# Patient Record
Sex: Male | Born: 1945
Health system: Southern US, Community
[De-identification: ages and names within clinical notes are randomized; demographics above are authoritative.]

## PROBLEM LIST (undated history)

## (undated) DIAGNOSIS — N183 Chronic kidney disease, stage 3 unspecified: Secondary | ICD-10-CM

## (undated) DIAGNOSIS — M1A9XX0 Chronic gout, unspecified, without tophus (tophi): Secondary | ICD-10-CM

## (undated) DIAGNOSIS — C679 Malignant neoplasm of bladder, unspecified: Secondary | ICD-10-CM

## (undated) DIAGNOSIS — M19019 Primary osteoarthritis, unspecified shoulder: Secondary | ICD-10-CM

## (undated) DIAGNOSIS — I340 Nonrheumatic mitral (valve) insufficiency: Secondary | ICD-10-CM

## (undated) DIAGNOSIS — E785 Hyperlipidemia, unspecified: Secondary | ICD-10-CM

## (undated) DIAGNOSIS — F419 Anxiety disorder, unspecified: Secondary | ICD-10-CM

## (undated) DIAGNOSIS — D696 Thrombocytopenia, unspecified: Secondary | ICD-10-CM

## (undated) DIAGNOSIS — R0609 Other forms of dyspnea: Secondary | ICD-10-CM

## (undated) DIAGNOSIS — Z794 Long term (current) use of insulin: Secondary | ICD-10-CM

## (undated) DIAGNOSIS — R600 Localized edema: Secondary | ICD-10-CM

## (undated) DIAGNOSIS — C61 Malignant neoplasm of prostate: Secondary | ICD-10-CM

## (undated) DIAGNOSIS — R0600 Dyspnea, unspecified: Secondary | ICD-10-CM

## (undated) DIAGNOSIS — G4733 Obstructive sleep apnea (adult) (pediatric): Secondary | ICD-10-CM

## (undated) DIAGNOSIS — N184 Chronic kidney disease, stage 4 (severe): Secondary | ICD-10-CM

## (undated) DIAGNOSIS — I351 Nonrheumatic aortic (valve) insufficiency: Secondary | ICD-10-CM

## (undated) DIAGNOSIS — Z9641 Presence of insulin pump (external) (internal): Secondary | ICD-10-CM

## (undated) DIAGNOSIS — I2510 Atherosclerotic heart disease of native coronary artery without angina pectoris: Secondary | ICD-10-CM

## (undated) DIAGNOSIS — M109 Gout, unspecified: Secondary | ICD-10-CM

## (undated) DIAGNOSIS — D649 Anemia, unspecified: Secondary | ICD-10-CM

## (undated) DIAGNOSIS — M1990 Unspecified osteoarthritis, unspecified site: Secondary | ICD-10-CM

## (undated) DIAGNOSIS — M069 Rheumatoid arthritis, unspecified: Secondary | ICD-10-CM

## (undated) DIAGNOSIS — E119 Type 2 diabetes mellitus without complications: Secondary | ICD-10-CM

## (undated) DIAGNOSIS — Z87442 Personal history of urinary calculi: Secondary | ICD-10-CM

## (undated) DIAGNOSIS — IMO0001 Reserved for inherently not codable concepts without codable children: Secondary | ICD-10-CM

## (undated) DIAGNOSIS — M7552 Bursitis of left shoulder: Secondary | ICD-10-CM

## (undated) DIAGNOSIS — I10 Essential (primary) hypertension: Secondary | ICD-10-CM

## (undated) HISTORY — PX: TRANSURETHRAL RESECTION OF BLADDER TUMOR WITH MITOMYCIN-C: SHX6459

## (undated) HISTORY — DX: Hyperlipidemia, unspecified: E78.5

---

## 2001-05-15 ENCOUNTER — Encounter: Payer: Self-pay | Admitting: Family Medicine

## 2001-05-15 ENCOUNTER — Encounter: Admission: RE | Admit: 2001-05-15 | Discharge: 2001-05-15 | Payer: Self-pay | Admitting: Family Medicine

## 2001-06-09 ENCOUNTER — Encounter: Admission: RE | Admit: 2001-06-09 | Discharge: 2001-09-07 | Payer: Self-pay | Admitting: Family Medicine

## 2007-06-18 ENCOUNTER — Emergency Department (HOSPITAL_COMMUNITY): Admission: EM | Admit: 2007-06-18 | Discharge: 2007-06-18 | Payer: Self-pay | Admitting: Emergency Medicine

## 2008-07-31 DIAGNOSIS — Z8551 Personal history of malignant neoplasm of bladder: Secondary | ICD-10-CM

## 2008-07-31 HISTORY — DX: Personal history of malignant neoplasm of bladder: Z85.51

## 2008-08-24 ENCOUNTER — Ambulatory Visit (HOSPITAL_BASED_OUTPATIENT_CLINIC_OR_DEPARTMENT_OTHER): Admission: RE | Admit: 2008-08-24 | Discharge: 2008-08-24 | Payer: Self-pay | Admitting: Urology

## 2008-08-24 ENCOUNTER — Encounter (INDEPENDENT_AMBULATORY_CARE_PROVIDER_SITE_OTHER): Payer: Self-pay | Admitting: Urology

## 2008-08-24 HISTORY — PX: TRANSURETHRAL RESECTION OF BLADDER TUMOR WITH MITOMYCIN-C: SHX6459

## 2009-01-03 ENCOUNTER — Encounter: Admission: RE | Admit: 2009-01-03 | Discharge: 2009-01-03 | Payer: Self-pay | Admitting: Family Medicine

## 2009-09-15 ENCOUNTER — Encounter: Admission: RE | Admit: 2009-09-15 | Discharge: 2009-09-15 | Payer: Self-pay | Admitting: Nephrology

## 2011-02-12 NOTE — Op Note (Signed)
NAME:  Alec [name], Alec [name]                ACCOUNT NO.:  [id number]   MEDICAL RECORD NO.:  [id number]          PATIENT TYPE:  AMB   LOCATION:  [facility]                         FACILITY:  [facility]   PHYSICIAN:  Bernestine Amass, M.D.  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   PROCEDURE PERFORMED:  Transurethral resection of bladder tumor with  cystoscopy and instillation of mitomycin.   SURGEON:  Bernestine Amass, M.D.   ANESTHESIA:  General.   INDICATIONS:  [name] is a 65 year old male who was sent for  consultation by Dr. Roderick Pee.  The patient had experienced some bloody  urethral discharge.  He did not specifically have gross hematuria, but  again blood per urethra.  The patient did have a previous history of  tobacco use and had what appeared to be a substantial exposure to  secondhand smoke.  Cystoscopy in our office revealed a tumor that  appeared to be a papillary noninvasive transitional cell carcinoma.  This involved the posterior wall of his bladder.  The patient underwent  consultation in the office with regard to options.  He was told that  initially he needed a further cystoscopic assessment with  biopsy/resection of this.  The patient appeared to understand the  advantages and disadvantages of that approach.  He is scheduled now for  definitive resection.   TECHNIQUE AND FINDINGS:  The patient was brought to the operating room.  Appropriate time-out was accomplished.  The patient had a successful  induction of general anesthesia and was then placed in lithotomy  position and prepped and draped in the usual manner.  Cystoscopy  revealed an unremarkable anterior urethra.  Minimal lateral lobe tissue  but a fairly high-riding and  prominent median bar.  The bladder was  carefully panendoscoped with 12 degree as well as 70 degree lens  systems.  The patient had a solitary tumor measuring approximately 2 x 3  cm involving the posterior wall of his bladder.  The cystoscope was  removed and a 28 French resectoscope was inserted.  Loop was used to  resect the superficial tumor and also some underlying muscle.  No  evidence of bladder perforation occurred.  The area was cauterized and  hemostasis was excellent.  Reinspection of the area revealed no  bleeding.  Again, there was no evidence of bladder perforation.  A 20  French Foley catheter was inserted.  Urine was clear.  We went ahead  with mitomycin instillation putting in 30 mg in 3 mL of fluid.  The  catheter was capped and will be left indwelling for approximately 45  minutes at which point his bladder will be drained and gently irrigated.  The patient will be discharged home with a Foley catheter for 48 hours  with  instructions to remove that himself.  Followup will be arranged in our  office in 7-10  days and sooner if necessary.  The patient appeared to  tolerate the procedure well and with no obvious complications.  He was  brought to the recovery room in stable condition.      Bernestine Amass, M.D.  Electronically Signed     DSG/MEDQ  D:  08/24/2008  T:  08/24/2008  Job:  [id number]   cc:   Osvaldo Human, M.D.  Fax: [phone]

## 2011-03-08 ENCOUNTER — Other Ambulatory Visit: Payer: Self-pay

## 2011-07-02 LAB — POCT I-STAT 4, (NA,K, GLUC, HGB,HCT)
Glucose, Bld: 122 — ABNORMAL HIGH
HCT: 45
Hemoglobin: 15.3
Potassium: 4
Sodium: 140

## 2011-07-02 LAB — GLUCOSE, CAPILLARY: Glucose-Capillary: 179 — ABNORMAL HIGH

## 2011-07-11 LAB — URINALYSIS, ROUTINE W REFLEX MICROSCOPIC
Bilirubin Urine: NEGATIVE
Glucose, UA: NEGATIVE
Hgb urine dipstick: NEGATIVE
Ketones, ur: NEGATIVE
Nitrite: NEGATIVE
Protein, ur: NEGATIVE
Specific Gravity, Urine: 1.016
Urobilinogen, UA: 1
pH: 6.5

## 2011-07-11 LAB — BASIC METABOLIC PANEL
BUN: 19
CO2: 25
Calcium: 9.7
Chloride: 105
Creatinine, Ser: 1.75 — ABNORMAL HIGH
GFR calc Af Amer: 49 — ABNORMAL LOW
GFR calc non Af Amer: 40 — ABNORMAL LOW
Glucose, Bld: 128 — ABNORMAL HIGH
Potassium: 3.8
Sodium: 136

## 2011-07-11 LAB — HEPATIC FUNCTION PANEL
ALT: 29
AST: 27
Albumin: 3.7
Alkaline Phosphatase: 48
Bilirubin, Direct: <0.1
Total Bilirubin: 0.8
Total Protein: 7

## 2011-07-11 LAB — CBC
HCT: 40.8
Hemoglobin: 14.3
MCHC: 35.2
MCV: 88.9
Platelets: 174
RBC: 4.59
RDW: 13.3
WBC: 3.9 — ABNORMAL LOW

## 2011-07-11 LAB — LIPASE, BLOOD: Lipase: 32

## 2011-07-11 LAB — DIFFERENTIAL
Basophils Absolute: 0
Basophils Relative: 0
Eosinophils Absolute: 0.1
Eosinophils Relative: 1
Lymphocytes Relative: 35
Lymphs Abs: 1.4
Monocytes Absolute: 0.4
Monocytes Relative: 10
Neutro Abs: 2.1
Neutrophils Relative %: 53

## 2011-07-15 ENCOUNTER — Other Ambulatory Visit: Payer: Self-pay | Admitting: Gastroenterology

## 2011-12-25 ENCOUNTER — Encounter: Payer: Self-pay | Admitting: Physician Assistant

## 2011-12-25 DIAGNOSIS — Z0289 Encounter for other administrative examinations: Secondary | ICD-10-CM

## 2012-02-14 ENCOUNTER — Other Ambulatory Visit: Payer: Self-pay | Admitting: Surgery

## 2012-06-16 NOTE — Progress Notes (Signed)
This encounter was created in error - please disregard.

## 2012-06-25 ENCOUNTER — Encounter: Payer: Self-pay | Admitting: Family Medicine

## 2012-08-29 ENCOUNTER — Emergency Department (HOSPITAL_COMMUNITY): Payer: Managed Care, Other (non HMO)

## 2012-08-29 ENCOUNTER — Encounter (HOSPITAL_COMMUNITY): Payer: Self-pay | Admitting: Anesthesiology

## 2012-08-29 ENCOUNTER — Encounter (HOSPITAL_COMMUNITY)
Admission: EM | Disposition: A | Discharge status: Home / Self Care | Payer: Self-pay | Source: Home / Self Care | Attending: Emergency Medicine

## 2012-08-29 ENCOUNTER — Observation Stay (HOSPITAL_COMMUNITY): Payer: Managed Care, Other (non HMO) | Admitting: Anesthesiology

## 2012-08-29 ENCOUNTER — Observation Stay (HOSPITAL_COMMUNITY): Payer: Managed Care, Other (non HMO)

## 2012-08-29 ENCOUNTER — Observation Stay (HOSPITAL_COMMUNITY)
Admission: EM | Admit: 2012-08-29 | Discharge: 2012-08-30 | Disposition: A | Discharge status: Home / Self Care | Payer: Managed Care, Other (non HMO) | Attending: General Surgery | Admitting: General Surgery

## 2012-08-29 ENCOUNTER — Encounter (HOSPITAL_COMMUNITY): Payer: Self-pay | Admitting: Family Medicine

## 2012-08-29 DIAGNOSIS — K8000 Calculus of gallbladder with acute cholecystitis without obstruction: Principal | ICD-10-CM | POA: Insufficient documentation

## 2012-08-29 DIAGNOSIS — K8020 Calculus of gallbladder without cholecystitis without obstruction: Secondary | ICD-10-CM

## 2012-08-29 DIAGNOSIS — I10 Essential (primary) hypertension: Secondary | ICD-10-CM | POA: Insufficient documentation

## 2012-08-29 DIAGNOSIS — E119 Type 2 diabetes mellitus without complications: Secondary | ICD-10-CM | POA: Insufficient documentation

## 2012-08-29 DIAGNOSIS — K810 Acute cholecystitis: Secondary | ICD-10-CM | POA: Diagnosis present

## 2012-08-29 DIAGNOSIS — Z79899 Other long term (current) drug therapy: Secondary | ICD-10-CM | POA: Insufficient documentation

## 2012-08-29 DIAGNOSIS — K8010 Calculus of gallbladder with chronic cholecystitis without obstruction: Secondary | ICD-10-CM

## 2012-08-29 DIAGNOSIS — Z7982 Long term (current) use of aspirin: Secondary | ICD-10-CM | POA: Insufficient documentation

## 2012-08-29 DIAGNOSIS — Z23 Encounter for immunization: Secondary | ICD-10-CM | POA: Insufficient documentation

## 2012-08-29 DIAGNOSIS — R109 Unspecified abdominal pain: Secondary | ICD-10-CM

## 2012-08-29 DIAGNOSIS — IMO0001 Reserved for inherently not codable concepts without codable children: Secondary | ICD-10-CM | POA: Diagnosis present

## 2012-08-29 DIAGNOSIS — Z794 Long term (current) use of insulin: Secondary | ICD-10-CM | POA: Diagnosis present

## 2012-08-29 DIAGNOSIS — E785 Hyperlipidemia, unspecified: Secondary | ICD-10-CM | POA: Diagnosis present

## 2012-08-29 DIAGNOSIS — E1122 Type 2 diabetes mellitus with diabetic chronic kidney disease: Secondary | ICD-10-CM | POA: Diagnosis present

## 2012-08-29 HISTORY — PX: CHOLECYSTECTOMY: SHX55

## 2012-08-29 HISTORY — DX: Essential (primary) hypertension: I10

## 2012-08-29 LAB — URINALYSIS, ROUTINE W REFLEX MICROSCOPIC
Bilirubin Urine: NEGATIVE
Glucose, UA: >1000 mg/dL — AB
Hgb urine dipstick: NEGATIVE
Ketones, ur: NEGATIVE mg/dL
Leukocytes, UA: NEGATIVE
Nitrite: NEGATIVE
Protein, ur: 100 mg/dL — AB
Specific Gravity, Urine: 1.022 (ref 1.005–1.030)
Urobilinogen, UA: 0.2 mg/dL (ref 0.0–1.0)
pH: 5.5 (ref 5.0–8.0)

## 2012-08-29 LAB — CBC WITH DIFFERENTIAL/PLATELET
Basophils Absolute: 0 10*3/uL (ref 0.0–0.1)
Basophils Relative: 1 % (ref 0–1)
Eosinophils Absolute: 0 10*3/uL (ref 0.0–0.7)
Eosinophils Relative: 1 % (ref 0–5)
HCT: 35.2 % — ABNORMAL LOW (ref 39.0–52.0)
Hemoglobin: 12.9 g/dL — ABNORMAL LOW (ref 13.0–17.0)
Lymphocytes Relative: 34 % (ref 12–46)
Lymphs Abs: 1 10*3/uL (ref 0.7–4.0)
MCH: 31.3 pg (ref 26.0–34.0)
MCHC: 36.6 g/dL — ABNORMAL HIGH (ref 30.0–36.0)
MCV: 85.4 fL (ref 78.0–100.0)
Monocytes Absolute: 0.3 10*3/uL (ref 0.1–1.0)
Monocytes Relative: 11 % (ref 3–12)
Neutro Abs: 1.5 10*3/uL — ABNORMAL LOW (ref 1.7–7.7)
Neutrophils Relative %: 53 % (ref 43–77)
Platelets: 134 10*3/uL — ABNORMAL LOW (ref 150–400)
RBC: 4.12 MIL/uL — ABNORMAL LOW (ref 4.22–5.81)
RDW: 12.6 % (ref 11.5–15.5)
WBC: 2.8 10*3/uL — ABNORMAL LOW (ref 4.0–10.5)

## 2012-08-29 LAB — SURGICAL PCR SCREEN
MRSA, PCR: NEGATIVE
Staphylococcus aureus: POSITIVE — AB

## 2012-08-29 LAB — COMPREHENSIVE METABOLIC PANEL
ALT: 28 U/L (ref 0–53)
AST: 22 U/L (ref 0–37)
Albumin: 4 g/dL (ref 3.5–5.2)
Alkaline Phosphatase: 82 U/L (ref 39–117)
BUN: 26 mg/dL — ABNORMAL HIGH (ref 6–23)
CO2: 21 mEq/L (ref 19–32)
Calcium: 9.9 mg/dL (ref 8.4–10.5)
Chloride: 102 mEq/L (ref 96–112)
Creatinine, Ser: 1.33 mg/dL (ref 0.50–1.35)
GFR calc Af Amer: 64 mL/min — ABNORMAL LOW (ref >90)
GFR calc non Af Amer: 55 mL/min — ABNORMAL LOW (ref >90)
Glucose, Bld: 317 mg/dL — ABNORMAL HIGH (ref 70–99)
Potassium: 3.7 mEq/L (ref 3.5–5.1)
Sodium: 135 mEq/L (ref 135–145)
Total Bilirubin: 0.4 mg/dL (ref 0.3–1.2)
Total Protein: 7.2 g/dL (ref 6.0–8.3)

## 2012-08-29 LAB — HEMOGLOBIN A1C
Hgb A1c MFr Bld: 9.3 % — ABNORMAL HIGH (ref <5.7)
Mean Plasma Glucose: 220 mg/dL — ABNORMAL HIGH (ref <117)

## 2012-08-29 LAB — GLUCOSE, CAPILLARY
Glucose-Capillary: 200 mg/dL — ABNORMAL HIGH (ref 70–99)
Glucose-Capillary: 117 mg/dL — ABNORMAL HIGH (ref 70–99)
Glucose-Capillary: 116 mg/dL — ABNORMAL HIGH (ref 70–99)
Glucose-Capillary: 289 mg/dL — ABNORMAL HIGH (ref 70–99)

## 2012-08-29 LAB — URINE MICROSCOPIC-ADD ON

## 2012-08-29 LAB — LIPASE, BLOOD: Lipase: 70 U/L — ABNORMAL HIGH (ref 11–59)

## 2012-08-29 SURGERY — LAPAROSCOPIC CHOLECYSTECTOMY WITH INTRAOPERATIVE CHOLANGIOGRAM
Anesthesia: General | Site: Abdomen | Wound class: Contaminated

## 2012-08-29 MED ORDER — ACETAMINOPHEN 10 MG/ML IV SOLN: 1000.0000 mg | Freq: Once | INTRAVENOUS | Status: DC | PRN

## 2012-08-29 MED ORDER — LACTATED RINGERS IV SOLN
INTRAVENOUS | Status: DC | PRN
  Administered 2012-08-29 (×3): via INTRAVENOUS

## 2012-08-29 MED ORDER — SUCCINYLCHOLINE CHLORIDE 20 MG/ML IJ SOLN
INTRAMUSCULAR | Status: DC | PRN
  Administered 2012-08-29: 100 mg via INTRAVENOUS

## 2012-08-29 MED ORDER — LACTATED RINGERS IR SOLN
Status: DC | PRN
  Administered 2012-08-29: 1000 mL

## 2012-08-29 MED ORDER — GLYCOPYRROLATE 0.2 MG/ML IJ SOLN
INTRAMUSCULAR | Status: DC | PRN
  Administered 2012-08-29: 0.6 mg via INTRAVENOUS

## 2012-08-29 MED ORDER — SODIUM CHLORIDE 0.9 % IV BOLUS (SEPSIS)
1000.0000 mL | Freq: Once | INTRAVENOUS | Status: AC
  Administered 2012-08-29: 1000 mL via INTRAVENOUS

## 2012-08-29 MED ORDER — SODIUM CHLORIDE 0.9 % IV SOLN
INTRAVENOUS | Status: DC | PRN
  Administered 2012-08-29: 15:00:00

## 2012-08-29 MED ORDER — OXYCODONE HCL 5 MG PO TABS: 5.0000 mg | ORAL_TABLET | Freq: Once | ORAL | Status: DC | PRN

## 2012-08-29 MED ORDER — BUPIVACAINE-EPINEPHRINE 0.25% -1:200000 IJ SOLN
INTRAMUSCULAR | Status: DC | PRN
  Administered 2012-08-29: 10 mL

## 2012-08-29 MED ORDER — IOHEXOL 300 MG/ML  SOLN
INTRAMUSCULAR | Status: AC
  Filled 2012-08-29: qty 1

## 2012-08-29 MED ORDER — ONDANSETRON HCL 4 MG/2ML IJ SOLN: 4.0000 mg | Freq: Four times a day (QID) | INTRAMUSCULAR | Status: DC | PRN

## 2012-08-29 MED ORDER — POTASSIUM CHLORIDE IN NACL 20-0.9 MEQ/L-% IV SOLN
INTRAVENOUS | Status: DC
  Administered 2012-08-29: 11:00:00 via INTRAVENOUS
  Filled 2012-08-29 (×2): qty 1000

## 2012-08-29 MED ORDER — HYDROMORPHONE HCL PF 1 MG/ML IJ SOLN
INTRAMUSCULAR | Status: AC
  Filled 2012-08-29: qty 1

## 2012-08-29 MED ORDER — LIDOCAINE HCL (CARDIAC) 20 MG/ML IV SOLN
INTRAVENOUS | Status: DC | PRN
  Administered 2012-08-29: 50 mg via INTRAVENOUS

## 2012-08-29 MED ORDER — ONDANSETRON HCL 4 MG/2ML IJ SOLN
INTRAMUSCULAR | Status: DC | PRN
  Administered 2012-08-29: 4 mg via INTRAVENOUS

## 2012-08-29 MED ORDER — PANTOPRAZOLE SODIUM 40 MG IV SOLR
40.0000 mg | Freq: Every day | INTRAVENOUS | Status: DC
  Filled 2012-08-29: qty 40

## 2012-08-29 MED ORDER — INSULIN REGULAR HUMAN 100 UNIT/ML IJ SOLN: 6.0000 [IU] | Freq: Once | INTRAMUSCULAR | Status: DC

## 2012-08-29 MED ORDER — MEPERIDINE HCL 25 MG/ML IJ SOLN: 6.2500 mg | INTRAMUSCULAR | Status: DC | PRN

## 2012-08-29 MED ORDER — CEFAZOLIN SODIUM-DEXTROSE 2-3 GM-% IV SOLR
2.0000 g | Freq: Three times a day (TID) | INTRAVENOUS | Status: DC
  Administered 2012-08-29: 2 g via INTRAVENOUS
  Filled 2012-08-29 (×2): qty 50

## 2012-08-29 MED ORDER — ROCURONIUM BROMIDE 100 MG/10ML IV SOLN
INTRAVENOUS | Status: DC | PRN
  Administered 2012-08-29: 30 mg via INTRAVENOUS

## 2012-08-29 MED ORDER — OXYCODONE-ACETAMINOPHEN 5-325 MG PO TABS: 1.0000 | ORAL_TABLET | ORAL | Status: DC | PRN

## 2012-08-29 MED ORDER — INSULIN GLARGINE 100 UNIT/ML ~~LOC~~ SOLN
10.0000 [IU] | SUBCUTANEOUS | Status: AC
  Administered 2012-08-29: 10 [IU] via SUBCUTANEOUS
  Filled 2012-08-29: qty 1

## 2012-08-29 MED ORDER — MIDAZOLAM HCL 5 MG/5ML IJ SOLN
INTRAMUSCULAR | Status: DC | PRN
  Administered 2012-08-29: 2 mg via INTRAVENOUS

## 2012-08-29 MED ORDER — NEOSTIGMINE METHYLSULFATE 1 MG/ML IJ SOLN
INTRAMUSCULAR | Status: DC | PRN
  Administered 2012-08-29: 4 mg via INTRAVENOUS

## 2012-08-29 MED ORDER — HYDROMORPHONE HCL PF 1 MG/ML IJ SOLN
1.0000 mg | INTRAMUSCULAR | Status: DC | PRN
  Administered 2012-08-29 – 2012-08-30 (×3): 1 mg via INTRAVENOUS
  Filled 2012-08-29 (×2): qty 1

## 2012-08-29 MED ORDER — FENTANYL CITRATE 0.05 MG/ML IJ SOLN
INTRAMUSCULAR | Status: DC | PRN
  Administered 2012-08-29 (×2): 50 ug via INTRAVENOUS
  Administered 2012-08-29: 100 ug via INTRAVENOUS

## 2012-08-29 MED ORDER — HYDROMORPHONE HCL PF 1 MG/ML IJ SOLN
0.2500 mg | INTRAMUSCULAR | Status: DC | PRN
  Administered 2012-08-29 (×2): 0.5 mg via INTRAVENOUS

## 2012-08-29 MED ORDER — EPHEDRINE SULFATE 50 MG/ML IJ SOLN
INTRAMUSCULAR | Status: DC | PRN
  Administered 2012-08-29: 10 mg via INTRAVENOUS
  Administered 2012-08-29: 5 mg via INTRAVENOUS

## 2012-08-29 MED ORDER — SODIUM CHLORIDE 0.9 % IV SOLN
Freq: Once | INTRAVENOUS | Status: AC
  Administered 2012-08-29: 150 mL/h via INTRAVENOUS

## 2012-08-29 MED ORDER — DEXAMETHASONE SODIUM PHOSPHATE 10 MG/ML IJ SOLN
INTRAMUSCULAR | Status: DC | PRN
  Administered 2012-08-29: 10 mg via INTRAVENOUS

## 2012-08-29 MED ORDER — HYDRALAZINE HCL 20 MG/ML IJ SOLN
10.0000 mg | INTRAMUSCULAR | Status: DC | PRN
  Filled 2012-08-29: qty 0.5

## 2012-08-29 MED ORDER — BUPIVACAINE-EPINEPHRINE PF 0.25-1:200000 % IJ SOLN
INTRAMUSCULAR | Status: AC
  Filled 2012-08-29: qty 30

## 2012-08-29 MED ORDER — INSULIN ASPART 100 UNIT/ML ~~LOC~~ SOLN
6.0000 [IU] | Freq: Once | SUBCUTANEOUS | Status: AC
  Administered 2012-08-29: 6 [IU] via SUBCUTANEOUS
  Filled 2012-08-29: qty 1

## 2012-08-29 MED ORDER — MORPHINE SULFATE 2 MG/ML IJ SOLN: 2.0000 mg | INTRAMUSCULAR | Status: DC | PRN

## 2012-08-29 MED ORDER — PNEUMOCOCCAL VAC POLYVALENT 25 MCG/0.5ML IJ INJ
0.5000 mL | INJECTION | INTRAMUSCULAR | Status: DC
  Administered 2012-08-30: 0.5 mL via INTRAMUSCULAR

## 2012-08-29 MED ORDER — EPINEPHRINE HCL 1 MG/ML IJ SOLN
INTRAMUSCULAR | Status: AC
  Filled 2012-08-29: qty 1

## 2012-08-29 MED ORDER — MORPHINE SULFATE 4 MG/ML IJ SOLN
4.0000 mg | Freq: Once | INTRAMUSCULAR | Status: AC
  Administered 2012-08-29: 4 mg via INTRAVENOUS
  Filled 2012-08-29: qty 1

## 2012-08-29 MED ORDER — CEFAZOLIN SODIUM-DEXTROSE 2-3 GM-% IV SOLR
INTRAVENOUS | Status: AC
  Filled 2012-08-29: qty 50

## 2012-08-29 MED ORDER — PROMETHAZINE HCL 25 MG/ML IJ SOLN: 6.2500 mg | INTRAMUSCULAR | Status: DC | PRN

## 2012-08-29 MED ORDER — OXYCODONE HCL 5 MG/5ML PO SOLN: 5.0000 mg | Freq: Once | ORAL | Status: DC | PRN

## 2012-08-29 MED ORDER — PROPOFOL 10 MG/ML IV BOLUS
INTRAVENOUS | Status: DC | PRN
  Administered 2012-08-29: 160 mg via INTRAVENOUS

## 2012-08-29 MED ORDER — ONDANSETRON HCL 4 MG/2ML IJ SOLN
4.0000 mg | Freq: Once | INTRAMUSCULAR | Status: AC
  Administered 2012-08-29: 4 mg via INTRAVENOUS
  Filled 2012-08-29: qty 2

## 2012-08-29 MED ORDER — INSULIN ASPART 100 UNIT/ML ~~LOC~~ SOLN
0.0000 [IU] | SUBCUTANEOUS | Status: DC
  Administered 2012-08-29: 8 [IU] via SUBCUTANEOUS
  Administered 2012-08-30 (×2): 3 [IU] via SUBCUTANEOUS
  Administered 2012-08-30: 8 [IU] via SUBCUTANEOUS

## 2012-08-29 MED ORDER — IBUPROFEN 600 MG PO TABS
600.0000 mg | ORAL_TABLET | Freq: Four times a day (QID) | ORAL | Status: DC | PRN
  Filled 2012-08-29: qty 1

## 2012-08-29 MED ORDER — INSULIN GLARGINE 100 UNIT/ML ~~LOC~~ SOLN
5.0000 [IU] | Freq: Every day | SUBCUTANEOUS | Status: DC
  Administered 2012-08-29: 5 [IU] via SUBCUTANEOUS

## 2012-08-29 MED ORDER — CEFAZOLIN SODIUM 1-5 GM-% IV SOLN
1.0000 g | Freq: Four times a day (QID) | INTRAVENOUS | Status: AC
  Administered 2012-08-29 – 2012-08-30 (×3): 1 g via INTRAVENOUS
  Filled 2012-08-29 (×3): qty 50

## 2012-08-29 MED ORDER — ONDANSETRON HCL 4 MG PO TABS: 4.0000 mg | ORAL_TABLET | Freq: Four times a day (QID) | ORAL | Status: DC | PRN

## 2012-08-29 SURGICAL SUPPLY — 43 items
APL SKNCLS STERI-STRIP NONHPOA (GAUZE/BANDAGES/DRESSINGS) ×1
APPLIER CLIP ROT 10 11.4 M/L (STAPLE) ×2
APR CLP MED LRG 11.4X10 (STAPLE) ×1
BAG SPEC RTRVL LRG 6X4 10 (ENDOMECHANICALS) ×1
BENZOIN TINCTURE PRP APPL 2/3 (GAUZE/BANDAGES/DRESSINGS) ×2 IMPLANT
CANISTER SUCTION 2500CC (MISCELLANEOUS) ×2 IMPLANT
CHLORAPREP W/TINT 26ML (MISCELLANEOUS) ×2 IMPLANT
CLIP APPLIE ROT 10 11.4 M/L (STAPLE) ×1 IMPLANT
CLOTH BEACON ORANGE TIMEOUT ST (SAFETY) ×2 IMPLANT
COVER MAYO STAND STRL (DRAPES) ×2 IMPLANT
DECANTER SPIKE VIAL GLASS SM (MISCELLANEOUS) ×2 IMPLANT
DRAPE C-ARM 42X72 X-RAY (DRAPES) ×2 IMPLANT
DRAPE LAPAROSCOPIC ABDOMINAL (DRAPES) ×2 IMPLANT
DRAPE UTILITY XL STRL (DRAPES) ×2 IMPLANT
DRSG TEGADERM 2-3/8X2-3/4 SM (GAUZE/BANDAGES/DRESSINGS) ×7 IMPLANT
DRSG TEGADERM 4X4.75 (GAUZE/BANDAGES/DRESSINGS) ×2 IMPLANT
ELECT REM PT RETURN 9FT ADLT (ELECTROSURGICAL) ×2
ELECTRODE REM PT RTRN 9FT ADLT (ELECTROSURGICAL) ×1 IMPLANT
FILTER SMOKE EVAC LAPAROSHD (FILTER) ×1 IMPLANT
GLOVE BIO SURGEON STRL SZ7 (GLOVE) ×2 IMPLANT
GLOVE BIOGEL PI IND STRL 7.0 (GLOVE) ×1 IMPLANT
GLOVE BIOGEL PI IND STRL 7.5 (GLOVE) ×1 IMPLANT
GLOVE BIOGEL PI INDICATOR 7.0 (GLOVE) ×1
GLOVE BIOGEL PI INDICATOR 7.5 (GLOVE) ×2
GOWN STRL NON-REIN LRG LVL3 (GOWN DISPOSABLE) ×4 IMPLANT
GOWN STRL REIN XL XLG (GOWN DISPOSABLE) ×2 IMPLANT
HEMOSTAT SNOW SURGICEL 2X4 (HEMOSTASIS) ×1 IMPLANT
KIT BASIN OR (CUSTOM PROCEDURE TRAY) ×2 IMPLANT
NS IRRIG 1000ML POUR BTL (IV SOLUTION) ×2 IMPLANT
POUCH SPECIMEN RETRIEVAL 10MM (ENDOMECHANICALS) ×2 IMPLANT
RINGERS IRRIG 1000ML POUR BTL (IV SOLUTION) ×2 IMPLANT
SET CHOLANGIOGRAPH MIX (MISCELLANEOUS) ×2 IMPLANT
SET IRRIG TUBING LAPAROSCOPIC (IRRIGATION / IRRIGATOR) ×2 IMPLANT
SLEEVE SURGEON STRL (DRAPES) ×1 IMPLANT
SOLUTION ANTI FOG 6CC (MISCELLANEOUS) ×2 IMPLANT
STRIP CLOSURE SKIN 1/2X4 (GAUZE/BANDAGES/DRESSINGS) ×2 IMPLANT
SUT MNCRL AB 4-0 PS2 18 (SUTURE) ×2 IMPLANT
TOWEL OR 17X26 10 PK STRL BLUE (TOWEL DISPOSABLE) ×2 IMPLANT
TRAY LAP CHOLE (CUSTOM PROCEDURE TRAY) ×2 IMPLANT
TROCAR BLADELESS OPT 5 75 (ENDOMECHANICALS) ×4 IMPLANT
TROCAR XCEL BLUNT TIP 100MML (ENDOMECHANICALS) ×2 IMPLANT
TROCAR XCEL NON-BLD 11X100MML (ENDOMECHANICALS) ×2 IMPLANT
TUBING INSUFFLATION 10FT LAP (TUBING) ×2 IMPLANT

## 2012-08-29 NOTE — ED Provider Notes (Signed)
History     CSN: [id number]  Arrival date & time 08/29/12  [id number]   First MD Initiated Contact with Patient 08/29/12 [phone]      Chief Complaint  Patient presents with  . Abdominal Pain    (Consider location/radiation/quality/duration/timing/severity/associated sxs/prior treatment) HPI 66 year old male presents to emergency room with complaint of acute onset of periumbilical and right upper quadrant pain waking from sleep around 1 AM. Patient reports he had similar episode about 5 years ago, told he had gallstones. He followed up with a surgeon and was offered a cholecystectomy, but never followed up. He's not had pain in the interim. He denies any fever and chills. He has had nausea and vomiting. He denies any radiation of pain into his back or shoulder. He denies any diarrhea, no urinary symptoms. Patient has history of hypertension and diabetes. He reports his blood sugar runs from 100s to 150s.  Past Medical History  Diagnosis Date  . Hypertension   . Diabetes mellitus without complication     History reviewed. No pertinent past surgical history.  No family history on file.  History  Substance Use Topics  . Smoking status: Former Research scientist (life sciences)  . Smokeless tobacco: Not on file  . Alcohol Use: No      Review of Systems  See History of Present Illness; otherwise all other systems are reviewed and negative  Allergies  Review of patient's allergies indicates no known allergies.  Home Medications   Current Outpatient Rx  Name  Route  Sig  Dispense  Refill  . AMLODIPINE BESYLATE 10 MG PO TABS   Oral   Take 10 mg by mouth daily.         . ASPIRIN 81 MG PO TABS   Oral   Take 81 mg by mouth daily.         . ATORVASTATIN CALCIUM 20 MG PO TABS   Oral   Take 20 mg by mouth daily.         Marland Kitchen VITAMIN D3 3000 UNITS PO TABS   Oral   Take 1 tablet by mouth daily.         Marland Kitchen CINNAMON 500 MG PO TABS   Oral   Take 1,000 mg by mouth daily.         . COLESEVELAM HCL 625  MG PO TABS   Oral   Take 1,875 mg by mouth 2 (two) times daily with a meal. Pt takes 3 tabs twice daily for 1875 mg dose each time         . OMEGA-3 FATTY ACIDS 1000 MG PO CAPS   Oral   Take 2 g by mouth daily.         Marland Kitchen GEMFIBROZIL 600 MG PO TABS   Oral   Take 600 mg by mouth 2 (two) times daily before a meal.         . GLIMEPIRIDE 4 MG PO TABS   Oral   Take 4 mg by mouth daily before breakfast.         . INSULIN ASPART 100 UNIT/ML [place] SOLN   Subcutaneous   Inject 10 Units into the skin 3 (three) times daily before meals. Pt uses sliding scale         . INSULIN DETEMIR 100 UNIT/ML [place] SOLN   Subcutaneous   Inject 15 Units into the skin at bedtime.         Marland Kitchen LINAGLIPTIN 5 MG PO TABS   Oral   Take 5  mg by mouth daily.         Marland Kitchen LISINOPRIL-HYDROCHLOROTHIAZIDE 20-12.5 MG PO TABS   Oral   Take 1 tablet by mouth daily.         Marland Kitchen ONE-DAILY MULTI VITAMINS PO TABS   Oral   Take 1 tablet by mouth daily.         . SERTRALINE HCL 100 MG PO TABS   Oral   Take 150 mg by mouth daily. Pt takes 1 and 1/2 tablet for 150 mg dose           BP 154/67  Pulse 59  Temp 97.5 F (36.4 C)  Resp 18  SpO2 97%  Physical Exam  Nursing note and vitals reviewed. Constitutional: He is oriented to person, place, and time. He appears well-developed and well-nourished.  HENT:  Head: Normocephalic and atraumatic.  Nose: Nose normal.  Mouth/Throat: Oropharynx is clear and moist.  Eyes: Conjunctivae normal and EOM are normal. Pupils are equal, round, and reactive to light.  Neck: Normal range of motion. Neck supple. No JVD present. No tracheal deviation present. No thyromegaly present.  Cardiovascular: Normal rate, regular rhythm, normal heart sounds and intact distal pulses.  Exam reveals no gallop and no friction rub.   No murmur heard. Pulmonary/Chest: Effort normal and breath sounds normal. No stridor. No respiratory distress. He has no wheezes. He has no rales. He exhibits  no tenderness.  Abdominal: Soft. Bowel sounds are normal. He exhibits no distension and no mass. There is tenderness (tenderness palpation in right upper quadrant, positive Murphy's sign, and periumbilical area no rebound or guarding). There is no rebound and no guarding.  Musculoskeletal: Normal range of motion. He exhibits no edema and no tenderness.  Lymphadenopathy:    He has no cervical adenopathy.  Neurological: He is alert and oriented to person, place, and time. He exhibits normal muscle tone. Coordination normal.  Skin: Skin is warm and dry. No rash noted. No erythema. No pallor.  Psychiatric: He has a normal mood and affect. His behavior is normal. Judgment and thought content normal.    ED Course  Procedures (including critical care time)  Labs Reviewed  CBC WITH DIFFERENTIAL - Abnormal; Notable for the following:    WBC 2.8 (*)     RBC 4.12 (*)     Hemoglobin 12.9 (*)     HCT 35.2 (*)     MCHC 36.6 (*)     Platelets 134 (*)     Neutro Abs 1.5 (*)     All other components within normal limits  COMPREHENSIVE METABOLIC PANEL - Abnormal; Notable for the following:    Glucose, Bld 317 (*)     BUN 26 (*)     GFR calc non Af Amer 55 (*)     GFR calc Af Amer 64 (*)     All other components within normal limits  LIPASE, BLOOD - Abnormal; Notable for the following:    Lipase 70 (*)     All other components within normal limits   No results found.   No diagnosis found.    MDM  66 year old male with abdominal pain. He is noted to have pancytopenia, unknown etiology. Glucose is elevated at 317. We'll get ultrasound of his abdomen to evaluate for gallstones.   Care transferred to Dr Tomi Bamberger awaiting u/s results.        Kalman Drape, MD 08/29/12 2041

## 2012-08-29 NOTE — Op Note (Signed)
Laparoscopic Cholecystectomy with IOC Procedure Note  Indications: This patient presents with symptomatic gallbladder disease and will undergo laparoscopic cholecystectomy.  Pre-operative Diagnosis: Calculus of gallbladder with acute cholecystitis, without mention of obstruction  Post-operative Diagnosis: Same  Surgeon: Shakala Marlatt K.   Assistants: Greer Pickerel, MD, FACS  Anesthesia: General endotracheal anesthesia  ASA Class: 2E  Procedure Details  The patient was seen again in the Holding Room. The risks, benefits, complications, treatment options, and expected outcomes were discussed with the patient. The possibilities of reaction to medication, pulmonary aspiration, perforation of viscus, bleeding, recurrent infection, finding a normal gallbladder, the need for additional procedures, failure to diagnose a condition, the possible need to convert to an open procedure, and creating a complication requiring transfusion or operation were discussed with the patient. The likelihood of improving the patient's symptoms with return to their baseline status is good.  The patient and/or family concurred with the proposed plan, giving informed consent. The site of surgery properly noted. The patient was taken to Operating Room, identified as Alec [name] and the procedure verified as Laparoscopic Cholecystectomy with Intraoperative Cholangiogram. A Time Out was held and the above information confirmed.  Prior to the induction of general anesthesia, antibiotic prophylaxis was administered. General endotracheal anesthesia was then administered and tolerated well. After the induction, the abdomen was prepped with Chloraprep and draped in the sterile fashion. The patient was positioned in the supine position.  Local anesthetic agent was injected into the skin near the umbilicus and an incision made. We dissected down to the abdominal fascia with blunt dissection.  The fascia was incised vertically and we  entered the peritoneal cavity bluntly.  A pursestring suture of 0-Vicryl was placed around the fascial opening.  The Hasson cannula was inserted and secured with the stay suture.  Pneumoperitoneum was then created with CO2 and tolerated well without any adverse changes in the patient's vital signs. An 11-mm port was placed in the subxiphoid position.  Two 5-mm ports were placed in the right upper quadrant. All skin incisions were infiltrated with a local anesthetic agent before making the incision and placing the trocars.   We positioned the patient in reverse Trendelenburg, tilted slightly to the patient's left.  The visualization was difficult, so we placed another 5 mm trocar in the midline and used this to retract the omentum.  The gallbladder was identified, the fundus grasped and retracted cephalad. Adhesions were lysed bluntly and with the electrocautery where indicated, taking care not to injure any adjacent organs or viscus. The infundibulum was grasped and retracted laterally, exposing the peritoneum overlying the triangle of Calot. This was then divided and exposed in a blunt fashion. A critical view of the cystic duct and cystic artery was obtained.  The cystic duct was clearly identified and bluntly dissected circumferentially. The cystic duct was ligated with a clip distally.   An incision was made in the cystic duct and a small stone was extracted from the cystic duct.  The [facility] cholangiogram catheter introduced. The catheter was secured using a clip. A cholangiogram was then obtained which showed good visualization of the distal and proximal biliary tree with no sign of filling defects or obstruction.  Contrast flowed easily into the duodenum. The catheter was then removed.   The cystic duct was then ligated with clips and divided. The cystic artery was identified, dissected free, ligated with clips and divided as well.   The gallbladder was dissected from the liver bed in retrograde fashion  with  the electrocautery. The gallbladder was removed and placed in an Endocatch sac. The liver bed was irrigated and inspected. Hemostasis was achieved with the electrocautery and surgicel SNOW. Copious irrigation was utilized and was repeatedly aspirated until clear.  The gallbladder and Endocatch sac were then removed through the umbilical port site.  The pursestring suture was used to close the umbilical fascia.    We again inspected the right upper quadrant for hemostasis.  Pneumoperitoneum was released as we removed the trocars.  4-0 Monocryl was used to close the skin.   Benzoin, steri-strips, and clean dressings were applied. The patient was then extubated and brought to the recovery room in stable condition. Instrument, sponge, and needle counts were correct at closure and at the conclusion of the case.   Findings: Cholecystitis with Cholelithiasis  Estimated Blood Loss: Minimal         Drains: none         Specimens: Gallbladder           Complications: None; patient tolerated the procedure well.         Disposition: PACU         Condition: stable  Imogene Burn. Georgette Dover, MD, [facility]  08/29/2012 3:42 PM

## 2012-08-29 NOTE — Anesthesia Preprocedure Evaluation (Addendum)
Anesthesia Evaluation  Patient identified by MRN, date of birth, ID band Patient awake    Reviewed: Allergy & Precautions, H&P , NPO status , Patient's Chart, lab work & pertinent test results  Airway Mallampati: II TM Distance: >3 FB Neck ROM: Full    Dental  (+) Dental Advisory Given and Teeth Intact   Pulmonary former smoker,  breath sounds clear to auscultation  Pulmonary exam normal       Cardiovascular hypertension, Pt. on medications Rhythm:Regular Rate:Normal     Neuro/Psych negative neurological ROS  negative psych ROS   GI/Hepatic negative GI ROS, Neg liver ROS,   Endo/Other  diabetes, Poorly Controlled, Type 2, Oral Hypoglycemic Agents and Insulin Dependent  Renal/GU negative Renal ROS     Musculoskeletal negative musculoskeletal ROS (+)   Abdominal   Peds  Hematology negative hematology ROS (+)   Anesthesia Other Findings   Reproductive/Obstetrics                         Anesthesia Physical Anesthesia Plan  ASA: III  Anesthesia Plan: General   Post-op Pain Management:    Induction: Intravenous and Rapid sequence  Airway Management Planned: Oral ETT  Additional Equipment:   Intra-op Plan:   Post-operative Plan: Extubation in OR  Informed Consent: I have reviewed the patients History and Physical, chart, labs and discussed the procedure including the risks, benefits and alternatives for the proposed anesthesia with the patient or authorized representative who has indicated his/her understanding and acceptance.   Dental advisory given  Plan Discussed with: CRNA  Anesthesia Plan Comments:         Anesthesia Quick Evaluation

## 2012-08-29 NOTE — Anesthesia Postprocedure Evaluation (Signed)
Anesthesia Post Note  Patient: Alec [name]  Procedure(s) Performed: Procedure(s) (LRB): LAPAROSCOPIC CHOLECYSTECTOMY WITH INTRAOPERATIVE CHOLANGIOGRAM (N/A)  Anesthesia type: General  Patient location: PACU  Post pain: Pain level controlled  Post assessment: Post-op Vital signs reviewed  Last Vitals: BP 136/76  Pulse 68  Temp 35.9 C (Oral)  Resp 16  Ht 5\' 7"  (1.702 m)  Wt 175 lb (79.379 kg)  BMI 27.41 kg/m2  SpO2 94%  Post vital signs: Reviewed  Level of consciousness: sedated  Complications: No apparent anesthesia complications

## 2012-08-29 NOTE — ED Notes (Signed)
Patient states that he woke up at 0130 with abdominal pain. Indicates generalized abdominal pain. Reports nausea & vomiting. States he had similar episode 5 years ago and was told that it was his gallbladder but did not have gallbladder removed. Marland Kitchen

## 2012-08-29 NOTE — ED Notes (Signed)
md at bedside

## 2012-08-29 NOTE — ED Notes (Signed)
Pt alert and oriented x4. Respirations even and unlabored, bilateral symmetrical rise and fall of chest. Skin warm and dry. In no acute distress. Denies needs.   

## 2012-08-29 NOTE — Transfer of Care (Signed)
Immediate Anesthesia Transfer of Care Note  Patient: Alec [name]  Procedure(s) Performed: Procedure(s) (LRB) with comments: LAPAROSCOPIC CHOLECYSTECTOMY WITH INTRAOPERATIVE CHOLANGIOGRAM (N/A)  Patient Location: PACU  Anesthesia Type:General  Level of Consciousness: sedated  Airway & Oxygen Therapy: Patient Spontanous Breathing and Patient connected to face mask oxygen  Post-op Assessment: Report given to PACU RN and Post -op Vital signs reviewed and stable  Post vital signs: Reviewed and stable  Complications: No apparent anesthesia complications

## 2012-08-29 NOTE — H&P (Signed)
Alec [name] is an 66 y.o. male.   Chief Complaint: RUQ pain, nausea, vomiting HPI: This is a 66 yo male with diabetes and hypertension who presents with several year history of cholelithiasis presents with several hours of acute RUQ abdominal pain, nausea, and vomiting.  He has had several minor episodes over the last few years, but this episode seems more severe.  Minimal relief with IV pain medications.  Past Medical History  Diagnosis Date  . Hypertension   . Diabetes mellitus without complication   hx of bladder cancer  History reviewed. No pertinent past surgical history. Bladder surgery   No family history on file. Social History:  reports that he has quit smoking. He does not have any smokeless tobacco history on file. He reports that he does not drink alcohol or use illicit drugs.  Allergies: No Known Allergies  Current Outpatient Rx   Name   Route   Sig   Dispense   Refill   .  AMLODIPINE BESYLATE 10 MG PO TABS   Oral   Take 10 mg by mouth daily.       .  ASPIRIN 81 MG PO TABS   Oral   Take 81 mg by mouth daily.       .  ATORVASTATIN CALCIUM 20 MG PO TABS   Oral   Take 20 mg by mouth daily.       Marland Kitchen  VITAMIN D3 3000 UNITS PO TABS   Oral   Take 1 tablet by mouth daily.       Marland Kitchen  CINNAMON 500 MG PO TABS   Oral   Take 1,000 mg by mouth daily.       .  COLESEVELAM HCL 625 MG PO TABS   Oral   Take 1,875 mg by mouth 2 (two) times daily with a meal. Pt takes 3 tabs twice daily for 1875 mg dose each time       .  OMEGA-3 FATTY ACIDS 1000 MG PO CAPS   Oral   Take 2 g by mouth daily.       Marland Kitchen  GEMFIBROZIL 600 MG PO TABS   Oral   Take 600 mg by mouth 2 (two) times daily before a meal.       .  GLIMEPIRIDE 4 MG PO TABS   Oral   Take 4 mg by mouth daily before breakfast.       .  INSULIN ASPART 100 UNIT/ML [place] SOLN   Subcutaneous   Inject 10 Units into the skin 3 (three) times daily before meals. Pt uses sliding scale       .  INSULIN DETEMIR 100 UNIT/ML [place] SOLN   Subcutaneous   Inject 15  Units into the skin at bedtime.       Marland Kitchen  LINAGLIPTIN 5 MG PO TABS   Oral   Take 5 mg by mouth daily.       Marland Kitchen  LISINOPRIL-HYDROCHLOROTHIAZIDE 20-12.5 MG PO TABS   Oral   Take 1 tablet by mouth daily.       Marland Kitchen  ONE-DAILY MULTI VITAMINS PO TABS   Oral   Take 1 tablet by mouth daily.       .  SERTRALINE HCL 100 MG PO TABS   Oral   Take 150 mg by mouth daily. Pt takes 1 and 1/2 tablet for 150 mg dose          Results for orders placed during the hospital encounter of 08/29/12 (  from the past 48 hour(s))  CBC WITH DIFFERENTIAL     Status: Abnormal   Collection Time   08/29/12  4:20 AM      Component Value Range Comment   WBC 2.8 (*) 4.0 - 10.5 K/uL    RBC 4.12 (*) 4.22 - 5.81 MIL/uL    Hemoglobin 12.9 (*) 13.0 - 17.0 g/dL    HCT 35.2 (*) 39.0 - 52.0 %    MCV 85.4  78.0 - 100.0 fL    MCH 31.3  26.0 - 34.0 pg    MCHC 36.6 (*) 30.0 - 36.0 g/dL    RDW 12.6  11.5 - 15.5 %    Platelets 134 (*) 150 - 400 K/uL    Neutrophils Relative 53  43 - 77 %    Lymphocytes Relative 34  12 - 46 %    Monocytes Relative 11  3 - 12 %    Eosinophils Relative 1  0 - 5 %    Basophils Relative 1  0 - 1 %    Neutro Abs 1.5 (*) 1.7 - 7.7 K/uL    Lymphs Abs 1.0  0.7 - 4.0 K/uL    Monocytes Absolute 0.3  0.1 - 1.0 K/uL    Eosinophils Absolute 0.0  0.0 - 0.7 K/uL    Basophils Absolute 0.0  0.0 - 0.1 K/uL    RBC Morphology OVALOCYTES     COMPREHENSIVE METABOLIC PANEL     Status: Abnormal   Collection Time   08/29/12  4:20 AM      Component Value Range Comment   Sodium 135  135 - 145 mEq/L    Potassium 3.7  3.5 - 5.1 mEq/L    Chloride 102  96 - 112 mEq/L    CO2 21  19 - 32 mEq/L    Glucose, Bld 317 (*) 70 - 99 mg/dL    BUN 26 (*) 6 - 23 mg/dL    Creatinine, Ser 1.33  0.50 - 1.35 mg/dL    Calcium 9.9  8.4 - 10.5 mg/dL    Total Protein 7.2  6.0 - 8.3 g/dL    Albumin 4.0  3.5 - 5.2 g/dL    AST 22  0 - 37 U/L    ALT 28  0 - 53 U/L    Alkaline Phosphatase 82  39 - 117 U/L    Total Bilirubin 0.4  0.3 - 1.2 mg/dL      GFR calc non Af Amer 55 (*) >90 mL/min    GFR calc Af Amer 64 (*) >90 mL/min   LIPASE, BLOOD     Status: Abnormal   Collection Time   08/29/12  4:20 AM      Component Value Range Comment   Lipase 70 (*) 11 - 59 U/L   URINALYSIS, ROUTINE W REFLEX MICROSCOPIC     Status: Abnormal   Collection Time   08/29/12  5:50 AM      Component Value Range Comment   Color, Urine YELLOW  YELLOW    APPearance CLEAR  CLEAR    Specific Gravity, Urine 1.022  1.005 - 1.030    pH 5.5  5.0 - 8.0    Glucose, UA >1000 (*) NEGATIVE mg/dL    Hgb urine dipstick NEGATIVE  NEGATIVE    Bilirubin Urine NEGATIVE  NEGATIVE    Ketones, ur NEGATIVE  NEGATIVE mg/dL    Protein, ur 100 (*) NEGATIVE mg/dL    Urobilinogen, UA 0.2  0.0 - 1.0 mg/dL  Nitrite NEGATIVE  NEGATIVE    Leukocytes, UA NEGATIVE  NEGATIVE   URINE MICROSCOPIC-ADD ON     Status: Normal   Collection Time   08/29/12  5:50 AM      Component Value Range Comment   Squamous Epithelial / LPF RARE  RARE    WBC, UA 0-2  <3 WBC/hpf    RBC / HPF 0-2  <3 RBC/hpf    US Abdomen Complete  08/29/2012  *RADIOLOGY REPORT*  Clinical Data:  Right upper quadrant abdominal pain.  COMPLETE ABDOMINAL ULTRASOUND  Comparison:  Renal ultrasound also 17/2 1010.  Abdominal ultrasound 06/18/2007.  Findings:  Gallbladder:  A 7 mm stone is present at the neck of the gallbladder.  This is nonobstructing.  Gallbladder wall thickness is within normal limits 2.3 mm.  There is no sonographic Murphy's sign.  Common bile duct:  Normal in caliber. No biliary ductal dilation. The maximal diameter is 5.7 mm, within normal limits.  Liver:  There is diffuse loss of normal internal architecture.  The liver is diffusely echogenic, suggesting diffuse fatty infiltration.  No discrete lesions are present.  IVC:  Appears normal.  Pancreas:  The pancreas is poorly visualized secondary to patient body habitus and bowel gas.  Spleen:  Normal size and echotexture without focal parenchymal abnormality.   The maximal diameter is 12.1 cm, within normal limits.  Right Kidney:  No hydronephrosis.  Well-preserved cortex.  Normal size and parenchymal echotexture without focal abnormalities. The maximal length is 11.9 cm, within normal limits.  Previously visualized cyst is not evident on today's exam.  Left Kidney:  A cyst in the posterior aspect of the left kidney measures 3 x 2.2 x 2.3 cm.  This demonstrates slight interval enlargement.  There is at least a single septation.  A second cyst is stable.  The parenchyma is otherwise unremarkable.  There are no stones.  The maximal length is 12.3 cm, within normal limits.  Abdominal aorta:  The aorta is not well visualized secondary to patient body habitus and bowel gas.  IMPRESSION:  1.  Probable cholelithiasis without evidence for cholecystitis. 2.  Diffuse fatty infiltration of the liver without a discrete lesion. 3.  Left renal cysts.  The lesion posteriorly in the right kidney demonstrates slight interval growth without a definite soft tissue component.  This remains benign.   Original Report Authenticated By: San Morelle, M.D.     Review of Systems  Eyes: Negative.   Gastrointestinal: Positive for nausea, vomiting and abdominal pain.  Neurological: Negative.   Endo/Heme/Allergies: Negative.     Blood pressure 111/61, pulse 54, temperature 97.7 F (36.5 C), temperature source Oral, resp. rate 16, SpO2 95.00%. Physical Exam  WDWN in NAD HEENT:  EOMI, sclera anicteric Neck:  No masses, no thyromegaly Lungs:  CTA bilaterally; normal respiratory effort CV:  Regular rate and rhythm; no murmurs Abd:  +bowel sounds, distended; tender in RUQ Ext:  Well-perfused; no edema Skin:  Warm, dry; no sign of jaundice  Assessment/Plan Acute cholecystitis; possible mild pancreatitis  Laparoscopic cholecystectomy with intraoperative cholangiogram today.  The surgical procedure has been discussed with the patient.  Potential risks, benefits, alternative  treatments, and expected outcomes have been explained.  All of the patient's questions at this time have been answered.  The likelihood of reaching the patient's treatment goal is good.  The patient understand the proposed surgical procedure and wishes to proceed.   Santrice Muzio K. 08/29/2012, 10:19 AM

## 2012-08-29 NOTE — ED Notes (Signed)
Report given to taylor, rn on floor

## 2012-08-29 NOTE — Consult Note (Signed)
[facility] Medical Consultation  Alec [name] [id number] DOB: 1946-08-17 DOA: 08/29/2012 PCP: No primary provider on file.   Requesting physician: Dr  Date of consultation: 08/29/2012 Reason for consultation:   Impression/Recommendations Principal Problem:  Acute cholecystitis/ Abdominal pain Patient admitted with acute cholecystitis. Patient admitted by general surgery. Patient scheduled to have cholecystectomy. We'll defer starting off antibiotic to general surgery.   Patient cleared for surgery.  Patient moderate risk for surgical procedures. He does have diabetes, hypertension, and dyslipidemia.  No chest pain, no shortness of breath he is very active. Will get EKG.   Active Problems:  Diabetes Patient is n.p.o. We'll do Accu-Cheks every 4 hours and place patient on Lantus and sliding scale insulin. Check hemoglobin A1c.   HTN (hypertension) Will hold blood pressure medications for now. Patient has bradycardia so will not put him on a beta blocker perioperatively, but will cover with hydralazine when necessary for systolic greater than [id number].   Dyslipidemia Will hold his cholesterol medications for now.  Mild elevated pancreatic enzymes Will recheck in the morning.  Leukopenia This is chronic. Patient can followup outpatient.  Anxiety Will hold his by mouth medications. Will restart tomorrow post surgery.  GI/DVT prophylaxis We'll defer to surgery.     I will followup again tomorrow. Please contact me if I can be of assistance in the meanwhile. Thank you for this consultation.   Chief Complaint: Abdominal pain  HPI:  Patient is a 66 year old white male with past medical history significant for diabetes, hypertension, dyslipidemia, and history of bladder cancer. Patient stated that this morning about 1 AM he started having abdominal pain in the right upper quadrant with nausea and vomiting times one. Some subjective fevers and chills. He stated that he had  similar pain 5 years ago and saw general surgery at that time they offered him cholecystectomy but he opted to defer surgery unless he has more symptoms. He has been asymptomatic until now.  Review of Systems:  Negative otherwise stated in the history of present illness  Past Medical History  Diagnosis Date  . Hypertension   . Diabetes mellitus without complication   History of bladder cancer Anxiety Dyslipidemia . Social History:  Rreports that he has quit smoking. He does not have any smokeless tobacco history on file. He reports that he does not drink alcohol or use illicit drugs. He is married and has 2 children. He works as a Administrator  No Known Allergies   Family history Both parents died from lung cancer they were both smokers.  Prior to Admission medications   Medication Sig Start Date End Date Taking? Authorizing Provider  amLODipine (NORVASC) 10 MG tablet Take 10 mg by mouth daily.   Yes Historical Provider, MD  aspirin 81 MG tablet Take 81 mg by mouth daily.   Yes Historical Provider, MD  atorvastatin (LIPITOR) 20 MG tablet Take 20 mg by mouth daily.   Yes Historical Provider, MD  Cholecalciferol (VITAMIN D3) 3000 UNITS TABS Take 1 tablet by mouth daily.   Yes Historical Provider, MD  Cinnamon 500 MG TABS Take 1,000 mg by mouth daily.   Yes Historical Provider, MD  colesevelam (WELCHOL) 625 MG tablet Take 1,875 mg by mouth 2 (two) times daily with a meal. Pt takes 3 tabs twice daily for 1875 mg dose each time   Yes Historical Provider, MD  fish oil-omega-3 fatty acids 1000 MG capsule Take 2 g by mouth daily.   Yes Historical Provider, MD  gemfibrozil (  LOPID) 600 MG tablet Take 600 mg by mouth 2 (two) times daily before a meal.   Yes Historical Provider, MD  glimepiride (AMARYL) 4 MG tablet Take 4 mg by mouth daily before breakfast.   Yes Historical Provider, MD  insulin aspart (NOVOLOG) 100 UNIT/ML injection Inject 10 Units into the skin 3 (three) times daily before  meals. Pt uses sliding scale   Yes Historical Provider, MD  insulin detemir (LEVEMIR) 100 UNIT/ML injection Inject 15 Units into the skin at bedtime.   Yes Historical Provider, MD  linagliptin (TRADJENTA) 5 MG TABS tablet Take 5 mg by mouth daily.   Yes Historical Provider, MD  lisinopril-hydrochlorothiazide (PRINZIDE,ZESTORETIC) 20-12.5 MG per tablet Take 1 tablet by mouth daily.   Yes Historical Provider, MD  Multiple Vitamin (MULTIVITAMIN) tablet Take 1 tablet by mouth daily.   Yes Historical Provider, MD  sertraline (ZOLOFT) 100 MG tablet Take 150 mg by mouth daily. Pt takes 1 and 1/2 tablet for 150 mg dose   Yes Historical Provider, MD   Physical Exam: Filed Vitals:   08/29/12 0600 08/29/12 0722 08/29/12 0800 08/29/12 0930  BP: 120/59 122/64 111/61 122/68  Pulse: 55 55 54 56  Temp:  97.7 F (36.5 C)    TempSrc:  Oral    Resp:  16    SpO2: 96% 98% 95% 97%     General:  Patient is lying on the stretcher he does not seem to be in any acute distress.  HEENT: Head is normocephalic atraumatic pupils reactive to light throat without erythema  Cardiovascular: Slightly bradycardic. No murmurs rubs or gallops.  Respiratory: Clear to auscultations bilaterally no wheezes rhonchi or rales  Abdomen: Soft with right upper quadrant tenderness positive bowel sounds no guarding no rebound   Skin: Dry and intact   Musculoskeletal: Strength 5/5 throughout   Psychiatric: Normal mood Neurologic: Nonfocal  Labs on Admission:  Basic Metabolic Panel:  Lab [id number] 0420  NA 135  K 3.7  CL 102  CO2 21  GLUCOSE 317*  BUN 26*  CREATININE 1.33  CALCIUM 9.9  MG --  PHOS --   Liver Function Tests:  Lab 08/29/12 0420  AST 22  ALT 28  ALKPHOS 82  BILITOT 0.4  PROT 7.2  ALBUMIN 4.0    Lab 08/29/12 0420  LIPASE 70*  AMYLASE --   No results found for this basename: AMMONIA:5 in the last 168 hours CBC:  Lab 08/29/12 0420  WBC 2.8*  NEUTROABS 1.5*  HGB 12.9*  HCT 35.2*  MCV  85.4  PLT 134*     Lab 08/29/12 1043  GLUCAP 200*    Radiological Exams on Admission: US Abdomen Complete  08/29/2012  *RADIOLOGY REPORT*  Clinical Data:  Right upper quadrant abdominal pain.  COMPLETE ABDOMINAL ULTRASOUND  Comparison:  Renal ultrasound also 17/2 1010.  Abdominal ultrasound 06/18/2007.  Findings:  Gallbladder:  A 7 mm stone is present at the neck of the gallbladder.  This is nonobstructing.  Gallbladder wall thickness is within normal limits 2.3 mm.  There is no sonographic Murphy's sign.  Common bile duct:  Normal in caliber. No biliary ductal dilation. The maximal diameter is 5.7 mm, within normal limits.  Liver:  There is diffuse loss of normal internal architecture.  The liver is diffusely echogenic, suggesting diffuse fatty infiltration.  No discrete lesions are present.  IVC:  Appears normal.  Pancreas:  The pancreas is poorly visualized secondary to patient body habitus and bowel gas.  Spleen:  Normal  size and echotexture without focal parenchymal abnormality.  The maximal diameter is 12.1 cm, within normal limits.  Right Kidney:  No hydronephrosis.  Well-preserved cortex.  Normal size and parenchymal echotexture without focal abnormalities. The maximal length is 11.9 cm, within normal limits.  Previously visualized cyst is not evident on today's exam.  Left Kidney:  A cyst in the posterior aspect of the left kidney measures 3 x 2.2 x 2.3 cm.  This demonstrates slight interval enlargement.  There is at least a single septation.  A second cyst is stable.  The parenchyma is otherwise unremarkable.  There are no stones.  The maximal length is 12.3 cm, within normal limits.  Abdominal aorta:  The aorta is not well visualized secondary to patient body habitus and bowel gas.  IMPRESSION:  1.  Probable cholelithiasis without evidence for cholecystitis. 2.  Diffuse fatty infiltration of the liver without a discrete lesion. 3.  Left renal cysts.  The lesion posteriorly in the right kidney  demonstrates slight interval growth without a definite soft tissue component.  This remains benign.   Original Report Authenticated By: San Morelle, M.D.     EKG:  ordered. Results pending.      Time spent: 60  minutes  Sylvester Harder, MD  [facility] Team 5 Pager [phone].  If 7PM-7AM, please contact night-coverage at [email or web address], password [facility] 08/29/2012, 10:54 AM

## 2012-08-29 NOTE — ED Provider Notes (Addendum)
Pt was seen by Dr Sharol Given earlier this am for abdominal pain.  US shows gallstone without gallbladder wall thickening or pericholecystic fluid.    Pt is feeling better but still has some pain.  On exam, mild ttp ruq.  Considering her persitent pain, I consulted with Dr Georgette Dover (gen surg).  He will come and evaluate the patient.  Likely recommend surgery.  Insulin SQ given for his hyperglycemia.  Will consult with medicine to assist in that management while hospitalized.  Kathalene Frames, MD 08/29/12 [phone]  Spoke with hospitalist.  They will consult and help manage the diabetes and medical issues.  Kathalene Frames, MD 08/29/12 [phone]

## 2012-08-30 LAB — GLUCOSE, CAPILLARY
Glucose-Capillary: 159 mg/dL — ABNORMAL HIGH (ref 70–99)
Glucose-Capillary: 193 mg/dL — ABNORMAL HIGH (ref 70–99)
Glucose-Capillary: 270 mg/dL — ABNORMAL HIGH (ref 70–99)

## 2012-08-30 LAB — LIPASE, BLOOD: Lipase: 34 U/L (ref 11–59)

## 2012-08-30 MED ORDER — OXYCODONE-ACETAMINOPHEN 5-325 MG PO TABS: 1.0000 | ORAL_TABLET | ORAL | Status: DC | PRN

## 2012-08-30 NOTE — Progress Notes (Signed)
Discharged from floor via w/c, spouse with pt. No changes in assessment. Alec [name]   

## 2012-08-30 NOTE — Discharge Summary (Signed)
Physician Discharge Summary  Patient ID: Alec [name] MRN: [id number] DOB/AGE: 64-Jun-1947 64 y.o.  Admit date: 08/29/2012 Discharge date: 08/30/2012  Admission Diagnoses: Acute cholecystitis  Discharge Diagnoses:  Acute cholecystitis Principal Problem:  *Acute cholecystitis Active Problems:  Diabetes  HTN (hypertension)  Dyslipidemia  Abdominal pain   Discharged Condition: good  Hospital Course: Admitted on 11/30 for acute cholecystitis.  Underwent laparoscopic cholecystectomy with intraoperative cholangiogram.  Found to have a single stone impacted in the cystic duct.  Did well post-op.  Consults: [facility] - diabetes management  Significant Diagnostic Studies: none  Treatments: surgery: lap chole with IOC  Discharge Exam: Blood pressure 113/69, pulse 60, temperature 96.7 F (35.9 C), temperature source Oral, resp. rate 18, height 5\' 7"  (1.702 m), weight 175 lb (79.379 kg), SpO2 97.00%. GI: soft, incisional tenderness Dressings c/d/i  Disposition: Discharge home  Discharge Orders    Future Orders Please Complete By Expires   Diet general      Increase activity slowly      May walk up steps      May shower / Bathe      Driving Restrictions      Comments:   Do not drive while taking pain medications   Call MD for:  temperature >100.4      Call MD for:  persistant nausea and vomiting      Call MD for:  severe uncontrolled pain      Call MD for:  redness, tenderness, or signs of infection (pain, swelling, redness, odor or green/yellow discharge around incision site)      Discharge instructions      Comments:   [facility] LAPAROSCOPIC SURGERY: POST OP INSTRUCTIONS Always review your discharge instruction sheet given to you by the facility where your surgery was performed. IF YOU HAVE DISABILITY OR FAMILY LEAVE FORMS, YOU MUST BRING THEM TO THE OFFICE FOR PROCESSING.   DO NOT GIVE THEM TO YOUR DOCTOR.  A prescription for pain  medication will be given to you upon discharge.  Take your pain medication as prescribed, if needed.  If narcotic pain medicine is not needed, then you may take acetaminophen (Tylenol) or ibuprofen (Advil) as needed. Take your usually prescribed medications unless otherwise directed. If you need a refill on your pain medication, please contact your pharmacy.  They will contact our office to request authorization. Prescriptions will not be filled after 5pm or on week-ends. You should follow a light diet the first few days after arrival home, such as soup and crackers, etc.  Be sure to include lots of fluids daily. Most patients will experience some swelling and bruising in the area of the incisions.  Ice packs will help.  Swelling and bruising can take several days to resolve.  It is common to experience some constipation if taking pain medication after surgery.  Increasing fluid intake and taking a stool softener (such as Colace) will usually help or prevent this problem from occurring.  A mild laxative (Milk of Magnesia or Miralax) should be taken according to package instructions if there are no bowel movements after 48 hours. Unless discharge instructions indicate otherwise, you may remove your bandages 48 hours after surgery, and you may shower at that time.  You will have steri-strips (small skin tapes) in place directly over the incision.  These strips should be left on the skin for 7-10 days.  If your surgeon used skin glue on the incision, you may shower in 24 hours.  The  glue will flake off over the next 2-3 weeks.  Any sutures or staples will be removed at the office during your follow-up visit. ACTIVITIES:  You may resume regular (light) daily activities beginning the next day-such as daily self-care, walking, climbing stairs-gradually increasing activities as tolerated.  You may have sexual intercourse when it is comfortable.  Refrain from any heavy lifting or straining until approved by your  doctor. You may drive when you are no longer taking prescription pain medication, you can comfortably wear a seatbelt, and you can safely maneuver your car and apply brakes. RETURN TO WORK:   2-3 weeks You should see your doctor in the office for a follow-up appointment approximately 2-3 weeks after your surgery.  Make sure that you call for this appointment within a day or two after you arrive home to insure a convenient appointment time. OTHER INSTRUCTIONS: ________________________________________________________________________ WHEN TO CALL YOUR DOCTOR: Fever over 101.0 Inability to urinate Continued bleeding from incision. Increased pain, redness, or drainage from the incision. Increasing abdominal pain  The clinic staff is available to answer your questions during regular business hours.  Please don't hesitate to call and ask to speak to one of the nurses for clinical concerns.  If you have a medical emergency, go to the nearest emergency room or call 911.  A surgeon from [facility] is always on call at the hospital. [address], [place], [place], [place]  [address]  [address] [place], [place],    [address] [phone]  FAX [phone] Web site: [email or web address]       Medication List     As of 08/30/2012  7:55 AM    TAKE these medications         amLODipine 10 MG tablet   Commonly known as: NORVASC   Take 10 mg by mouth daily.      aspirin 81 MG tablet   Take 81 mg by mouth daily.      atorvastatin 20 MG tablet   Commonly known as: LIPITOR   Take 20 mg by mouth daily.      Cinnamon 500 MG Tabs   Take 1,000 mg by mouth daily.      colesevelam 625 MG tablet   Commonly known as: WELCHOL   Take 1,875 mg by mouth 2 (two) times daily with a meal. Pt takes 3 tabs twice daily for 1875 mg dose each time      fish oil-omega-3 fatty acids 1000 MG capsule   Take 2 g by mouth daily.      gemfibrozil 600 MG tablet    Commonly known as: LOPID   Take 600 mg by mouth 2 (two) times daily before a meal.      glimepiride 4 MG tablet   Commonly known as: AMARYL   Take 4 mg by mouth daily before breakfast.      insulin aspart 100 UNIT/ML injection   Commonly known as: novoLOG   Inject 10 Units into the skin 3 (three) times daily before meals. Pt uses sliding scale      insulin detemir 100 UNIT/ML injection   Commonly known as: LEVEMIR   Inject 15 Units into the skin at bedtime.      linagliptin 5 MG Tabs tablet   Commonly known as: TRADJENTA   Take 5 mg by mouth daily.      lisinopril-hydrochlorothiazide 20-12.5 MG per tablet   Commonly known as: PRINZIDE,ZESTORETIC   Take 1 tablet by mouth daily.  multivitamin tablet   Take 1 tablet by mouth daily.      oxyCODONE-acetaminophen 5-325 MG per tablet   Commonly known as: PERCOCET/ROXICET   Take 1-2 tablets by mouth every 4 (four) hours as needed.      sertraline 100 MG tablet   Commonly known as: ZOLOFT   Take 150 mg by mouth daily. Pt takes 1 and 1/2 tablet for 150 mg dose      Vitamin D3 3000 UNITS Tabs   Take 1 tablet by mouth daily.           Follow-up Information    Follow up with Maia Petties., MD. Schedule an appointment as soon as possible for a visit in 2 weeks.   Contact information:   [address] [place] [address] [phone]          Signed: Maia Petties. 08/30/2012, 7:55 AM

## 2012-08-31 ENCOUNTER — Encounter (HOSPITAL_COMMUNITY): Payer: Self-pay | Admitting: Surgery

## 2012-09-02 ENCOUNTER — Telehealth (INDEPENDENT_AMBULATORY_CARE_PROVIDER_SITE_OTHER): Payer: Self-pay

## 2012-09-02 ENCOUNTER — Other Ambulatory Visit (INDEPENDENT_AMBULATORY_CARE_PROVIDER_SITE_OTHER): Payer: Self-pay

## 2012-09-02 DIAGNOSIS — G8918 Other acute postprocedural pain: Secondary | ICD-10-CM

## 2012-09-02 MED ORDER — HYDROCODONE-ACETAMINOPHEN 5-325 MG PO TABS: 1.0000 | ORAL_TABLET | Freq: Four times a day (QID) | ORAL | Status: DC | PRN

## 2012-09-02 NOTE — Telephone Encounter (Signed)
Patients daughter called in stating patient had surgery on Saturday 11/30 and was still in pain. I told her with any surgery pain could be expected for a few weeks and that he was just 4 days out so pain is still expected. She says he is taking percocet q 4 hrs and has enough to last until lunch time tomorrow. I told her I would call in our hydrocodone protocol to his pharmacy.

## 2012-09-03 NOTE — Progress Notes (Signed)
Utilization review completed.  

## 2012-09-11 ENCOUNTER — Encounter (INDEPENDENT_AMBULATORY_CARE_PROVIDER_SITE_OTHER): Payer: Self-pay | Admitting: Surgery

## 2012-09-11 ENCOUNTER — Ambulatory Visit (INDEPENDENT_AMBULATORY_CARE_PROVIDER_SITE_OTHER): Payer: Managed Care, Other (non HMO) | Admitting: Surgery

## 2012-09-11 VITALS — BP 124/78 | HR 74 | Temp 97.8°F | Resp 18 | Ht 66.0 in | Wt 182.2 lb

## 2012-09-11 DIAGNOSIS — K810 Acute cholecystitis: Secondary | ICD-10-CM

## 2012-09-11 NOTE — Progress Notes (Signed)
Status post laparoscopic cholecystectomy with intraoperative cholangiogram on 08/29/12. The patient is doing quite well. Initially he had a lot of shoulder pain but this went away within a couple of days. His incisions are well-healed with no sign of infection. Appetite bowel movements are normal. He is eager to resume full activity. He can return to work and thought to be today. Followup as needed  Alec [name]. Georgette Dover, MD, [facility]  09/11/2012 10:23 AM

## 2012-10-07 ENCOUNTER — Encounter (INDEPENDENT_AMBULATORY_CARE_PROVIDER_SITE_OTHER): Payer: Managed Care, Other (non HMO) | Admitting: Surgery

## 2012-10-09 ENCOUNTER — Encounter (INDEPENDENT_AMBULATORY_CARE_PROVIDER_SITE_OTHER): Payer: Managed Care, Other (non HMO) | Admitting: Surgery

## 2013-02-01 ENCOUNTER — Ambulatory Visit: Payer: Self-pay | Admitting: Family Medicine

## 2013-02-01 VITALS — BP 126/76 | HR 67 | Temp 98.0°F | Resp 16 | Ht 66.0 in | Wt 179.0 lb

## 2013-02-01 DIAGNOSIS — M7552 Bursitis of left shoulder: Secondary | ICD-10-CM

## 2013-02-01 DIAGNOSIS — M25519 Pain in unspecified shoulder: Secondary | ICD-10-CM

## 2013-02-01 DIAGNOSIS — M25512 Pain in left shoulder: Secondary | ICD-10-CM

## 2013-02-01 DIAGNOSIS — M67919 Unspecified disorder of synovium and tendon, unspecified shoulder: Secondary | ICD-10-CM

## 2013-02-01 MED ORDER — METHYLPREDNISOLONE ACETATE 80 MG/ML IJ SUSP
80.0000 mg | Freq: Once | INTRAMUSCULAR | Status: AC
  Administered 2013-02-01: 80 mg via INTRA_ARTICULAR

## 2013-02-01 NOTE — Progress Notes (Signed)
67 yo diabetic man with 2 weeks of continuous left shoulder pain radiating to left neck and down to left elbow.  No trauma or accident.  He's had this before and shot of cortisone seemed to cure the problem at the time.  Obj:  NAD Full neck ROM, nontnder neck with good reflexes Left handed Nontender left shoulder with normal inspection.  Pain with abduction.  Assessment:  Shoulder bursitis, left  Pain in joint, shoulder region, left - Plan: methylPREDNISolone acetate (DEPO-MEDROL) injection 80 mg  Bursitis, shoulder, left

## 2013-02-01 NOTE — Patient Instructions (Addendum)
Bursitis Bursitis is a swelling and soreness (inflammation) of a fluid-filled sac (bursa) that overlies and protects a joint. It can be caused by injury, overuse of the joint, arthritis or infection. The joints most likely to be affected are the elbows, shoulders, hips and knees. HOME CARE INSTRUCTIONS   Apply ice to the affected area for 15 to 20 minutes each hour while awake for 2 days. Put the ice in a plastic bag and place a towel between the bag of ice and your skin.  Rest the injured joint as much as possible, but continue to put the joint through a full range of motion, 4 times per day. (The shoulder joint especially becomes rapidly "frozen" if not used.) When the pain lessens, begin normal slow movements and usual activities.  Only take over-the-counter or prescription medicines for pain, discomfort or fever as directed by your caregiver.  Your caregiver may recommend draining the bursa and injecting medicine into the bursa. This may help the healing process.  Follow all instructions for follow-up with your caregiver. This includes any orthopedic referrals, physical therapy and rehabilitation. Any delay in obtaining necessary care could result in a delay or failure of the bursitis to heal and chronic pain. SEEK IMMEDIATE MEDICAL CARE IF:   Your pain increases even during treatment.  You develop an oral temperature above 102 F (38.9 C) and have heat and inflammation over the involved bursa. MAKE SURE YOU:   Understand these instructions.  Will watch your condition.  Will get help right away if you are not doing well or get worse. Document Released: 09/13/2000 Document Revised: 12/09/2011 Document Reviewed: 08/18/2009 [facility] Patient Information 2013 [place].

## 2013-07-01 DIAGNOSIS — N183 Chronic kidney disease, stage 3 unspecified: Secondary | ICD-10-CM | POA: Diagnosis not present

## 2013-07-01 DIAGNOSIS — E1165 Type 2 diabetes mellitus with hyperglycemia: Secondary | ICD-10-CM | POA: Diagnosis not present

## 2013-07-01 DIAGNOSIS — E782 Mixed hyperlipidemia: Secondary | ICD-10-CM | POA: Diagnosis not present

## 2013-07-01 DIAGNOSIS — M25519 Pain in unspecified shoulder: Secondary | ICD-10-CM | POA: Diagnosis not present

## 2013-07-01 DIAGNOSIS — E1129 Type 2 diabetes mellitus with other diabetic kidney complication: Secondary | ICD-10-CM | POA: Diagnosis not present

## 2013-07-01 DIAGNOSIS — I129 Hypertensive chronic kidney disease with stage 1 through stage 4 chronic kidney disease, or unspecified chronic kidney disease: Secondary | ICD-10-CM | POA: Diagnosis not present

## 2013-07-02 DIAGNOSIS — Z23 Encounter for immunization: Secondary | ICD-10-CM | POA: Diagnosis not present

## 2013-08-09 ENCOUNTER — Other Ambulatory Visit: Payer: Self-pay | Admitting: Sports Medicine

## 2013-08-09 DIAGNOSIS — M25519 Pain in unspecified shoulder: Secondary | ICD-10-CM | POA: Diagnosis not present

## 2013-08-09 DIAGNOSIS — M25512 Pain in left shoulder: Secondary | ICD-10-CM

## 2013-08-20 ENCOUNTER — Ambulatory Visit
Admission: RE | Admit: 2013-08-20 | Discharge: 2013-08-20 | Disposition: A | Discharge status: Home / Self Care | Payer: Medicare Other | Source: Ambulatory Visit | Attending: Sports Medicine | Admitting: Sports Medicine

## 2013-08-20 DIAGNOSIS — M19019 Primary osteoarthritis, unspecified shoulder: Secondary | ICD-10-CM | POA: Diagnosis not present

## 2013-08-20 DIAGNOSIS — M67919 Unspecified disorder of synovium and tendon, unspecified shoulder: Secondary | ICD-10-CM | POA: Diagnosis not present

## 2013-08-20 DIAGNOSIS — M25512 Pain in left shoulder: Secondary | ICD-10-CM

## 2013-08-23 DIAGNOSIS — M25519 Pain in unspecified shoulder: Secondary | ICD-10-CM | POA: Diagnosis not present

## 2013-08-30 DIAGNOSIS — M19019 Primary osteoarthritis, unspecified shoulder: Secondary | ICD-10-CM

## 2013-08-30 HISTORY — DX: Primary osteoarthritis, unspecified shoulder: M19.019

## 2013-08-31 ENCOUNTER — Encounter (HOSPITAL_BASED_OUTPATIENT_CLINIC_OR_DEPARTMENT_OTHER): Payer: Self-pay | Admitting: *Deleted

## 2013-08-31 NOTE — Pre-Procedure Instructions (Signed)
To come for BMET and EKG 

## 2013-09-01 ENCOUNTER — Encounter (HOSPITAL_BASED_OUTPATIENT_CLINIC_OR_DEPARTMENT_OTHER)
Admission: RE | Admit: 2013-09-01 | Discharge: 2013-09-01 | Disposition: A | Discharge status: Home / Self Care | Payer: Medicare Other | Source: Ambulatory Visit | Attending: Orthopedic Surgery | Admitting: Orthopedic Surgery

## 2013-09-01 DIAGNOSIS — Z01812 Encounter for preprocedural laboratory examination: Secondary | ICD-10-CM | POA: Insufficient documentation

## 2013-09-01 DIAGNOSIS — Z01810 Encounter for preprocedural cardiovascular examination: Secondary | ICD-10-CM | POA: Insufficient documentation

## 2013-09-01 DIAGNOSIS — Z01818 Encounter for other preprocedural examination: Secondary | ICD-10-CM | POA: Insufficient documentation

## 2013-09-01 LAB — BASIC METABOLIC PANEL
BUN: 19 mg/dL (ref 6–23)
BUN: 28 mg/dL — ABNORMAL HIGH (ref 6–23)
CO2: 21 mEq/L (ref 19–32)
CO2: 27 mEq/L (ref 19–32)
Calcium: 9.3 mg/dL (ref 8.4–10.5)
Calcium: 9.6 mg/dL (ref 8.4–10.5)
Chloride: 101 mEq/L (ref 96–112)
Chloride: 104 mEq/L (ref 96–112)
Creatinine, Ser: 1.02 mg/dL (ref 0.50–1.35)
Creatinine, Ser: 1.34 mg/dL (ref 0.50–1.35)
GFR calc Af Amer: 63 mL/min — ABNORMAL LOW (ref >90)
GFR calc Af Amer: 87 mL/min — ABNORMAL LOW (ref >90)
GFR calc non Af Amer: 54 mL/min — ABNORMAL LOW (ref >90)
GFR calc non Af Amer: 75 mL/min — ABNORMAL LOW (ref >90)
Glucose, Bld: 279 mg/dL — ABNORMAL HIGH (ref 70–99)
Glucose, Bld: 96 mg/dL (ref 70–99)
Potassium: 4.2 mEq/L (ref 3.5–5.1)
Potassium: 4.9 mEq/L (ref 3.5–5.1)
Sodium: 135 mEq/L (ref 135–145)
Sodium: 140 mEq/L (ref 135–145)

## 2013-09-02 NOTE — H&P (Signed)
  [facility] [address], [place] [address] [phone] A Division of [facility]  Ninetta Lights, M.D.   Robert A. Noemi Chapel, M.D.   Faythe Casa, M.D.   Johnny Bridge, M.D.   Almedia Balls, M.D Ernesta Amble. Percell Miller, M.D.  Joseph Pierini, M.D.  Lanier Prude, M.D.    Verner Chol, M.D. Mary L. Fenton Malling, PA-C  Kirstin A. Shepperson, PA-C  Josh Lincolnton, PA-C [place], [place]   RE: [place], [place]   [id number]      DOB: 14-Oct-1945 PROGRESS NOTE: 08-23-13 Reason for visit:  Follow-up MRI of his left shoulder. History of present illness: He has had continued pain since January of this year. He has tried [employer] and NSAID's without relief. He had an MRI obtained by Dr. Alfonso Ramus.   Please see associated documentation for this clinic visit for further past medical, family, surgical and social history, review of systems, and exam findings as this was reviewed by me.  EXAMINATION: Well appearing male in no apparent distress.  Left upper extremity has weakness with supraspinatus tenderness over the anterior shoulder he is neurovascularly intact.  IMAGING: MRI demonstrates biceps subluxation with a partial undersurface subscap tear and supraspinatus tear as well as AC arthritis.  ASSESSMENT: Patient with bicipital injury rotator cuff tear AC arthritis.  PLAN: 1. Discussed options and he would like to undergo arthroscopic supraspinatus repair arthroscopic biceps tenotomy subacromial decompression and distal clavicle excision with possible open subscap repair.  Ernesta Amble.  Percell Miller, M.D.  Electronically verified by Ernesta Amble. Percell Miller, M.D. TDM:kah D 08-30-13 T 08-30-13

## 2013-09-03 ENCOUNTER — Encounter (HOSPITAL_BASED_OUTPATIENT_CLINIC_OR_DEPARTMENT_OTHER)
Admission: RE | Disposition: A | Discharge status: Home / Self Care | Payer: Self-pay | Source: Ambulatory Visit | Attending: Orthopedic Surgery

## 2013-09-03 ENCOUNTER — Ambulatory Visit (HOSPITAL_BASED_OUTPATIENT_CLINIC_OR_DEPARTMENT_OTHER)
Admission: RE | Admit: 2013-09-03 | Discharge: 2013-09-03 | Disposition: A | Discharge status: Home / Self Care | Payer: Medicare Other | Source: Ambulatory Visit | Attending: Orthopedic Surgery | Admitting: Orthopedic Surgery

## 2013-09-03 ENCOUNTER — Ambulatory Visit (HOSPITAL_BASED_OUTPATIENT_CLINIC_OR_DEPARTMENT_OTHER): Payer: Medicare Other | Admitting: Anesthesiology

## 2013-09-03 ENCOUNTER — Encounter (HOSPITAL_BASED_OUTPATIENT_CLINIC_OR_DEPARTMENT_OTHER): Payer: Medicare Other | Admitting: Anesthesiology

## 2013-09-03 ENCOUNTER — Encounter (HOSPITAL_BASED_OUTPATIENT_CLINIC_OR_DEPARTMENT_OTHER): Payer: Self-pay | Admitting: Anesthesiology

## 2013-09-03 DIAGNOSIS — Z01812 Encounter for preprocedural laboratory examination: Secondary | ICD-10-CM | POA: Diagnosis not present

## 2013-09-03 DIAGNOSIS — G8918 Other acute postprocedural pain: Secondary | ICD-10-CM | POA: Diagnosis not present

## 2013-09-03 DIAGNOSIS — M19019 Primary osteoarthritis, unspecified shoulder: Secondary | ICD-10-CM | POA: Diagnosis not present

## 2013-09-03 DIAGNOSIS — M25519 Pain in unspecified shoulder: Secondary | ICD-10-CM | POA: Diagnosis not present

## 2013-09-03 DIAGNOSIS — M7520 Bicipital tendinitis, unspecified shoulder: Secondary | ICD-10-CM | POA: Diagnosis not present

## 2013-09-03 DIAGNOSIS — M67919 Unspecified disorder of synovium and tendon, unspecified shoulder: Secondary | ICD-10-CM | POA: Insufficient documentation

## 2013-09-03 DIAGNOSIS — Z01810 Encounter for preprocedural cardiovascular examination: Secondary | ICD-10-CM | POA: Diagnosis not present

## 2013-09-03 DIAGNOSIS — Z87891 Personal history of nicotine dependence: Secondary | ICD-10-CM | POA: Insufficient documentation

## 2013-09-03 DIAGNOSIS — E119 Type 2 diabetes mellitus without complications: Secondary | ICD-10-CM | POA: Insufficient documentation

## 2013-09-03 DIAGNOSIS — I10 Essential (primary) hypertension: Secondary | ICD-10-CM | POA: Diagnosis not present

## 2013-09-03 DIAGNOSIS — M719 Bursopathy, unspecified: Secondary | ICD-10-CM | POA: Insufficient documentation

## 2013-09-03 HISTORY — DX: Long term (current) use of insulin: Z79.4

## 2013-09-03 HISTORY — DX: Bursitis of left shoulder: M75.52

## 2013-09-03 HISTORY — DX: Type 2 diabetes mellitus without complications: E11.9

## 2013-09-03 HISTORY — PX: SHOULDER ARTHROSCOPY W/ ROTATOR CUFF REPAIR: SHX2400

## 2013-09-03 HISTORY — PX: SHOULDER ARTHROSCOPY WITH SUBACROMIAL DECOMPRESSION, ROTATOR CUFF REPAIR AND BICEP TENDON REPAIR: SHX5687

## 2013-09-03 HISTORY — DX: Primary osteoarthritis, unspecified shoulder: M19.019

## 2013-09-03 HISTORY — DX: Reserved for inherently not codable concepts without codable children: IMO0001

## 2013-09-03 LAB — GLUCOSE, CAPILLARY
Glucose-Capillary: 104 mg/dL — ABNORMAL HIGH (ref 70–99)
Glucose-Capillary: 182 mg/dL — ABNORMAL HIGH (ref 70–99)

## 2013-09-03 LAB — POCT HEMOGLOBIN-HEMACUE: Hemoglobin: 14.5 g/dL (ref 13.0–17.0)

## 2013-09-03 SURGERY — SHOULDER ARTHROSCOPY WITH SUBACROMIAL DECOMPRESSION, ROTATOR CUFF REPAIR AND BICEP TENDON REPAIR
Anesthesia: General | Site: Shoulder | Laterality: Left

## 2013-09-03 MED ORDER — SUCCINYLCHOLINE CHLORIDE 20 MG/ML IJ SOLN
INTRAMUSCULAR | Status: DC | PRN
  Administered 2013-09-03: 100 mg via INTRAVENOUS

## 2013-09-03 MED ORDER — OXYCODONE HCL 5 MG/5ML PO SOLN: 5.0000 mg | Freq: Once | ORAL | Status: DC | PRN

## 2013-09-03 MED ORDER — CHLORHEXIDINE GLUCONATE 4 % EX LIQD: 60.0000 mL | Freq: Once | CUTANEOUS | Status: DC

## 2013-09-03 MED ORDER — PROPOFOL 10 MG/ML IV BOLUS
INTRAVENOUS | Status: DC | PRN
  Administered 2013-09-03: 200 mg via INTRAVENOUS

## 2013-09-03 MED ORDER — BUPIVACAINE HCL (PF) 0.5 % IJ SOLN
INTRAMUSCULAR | Status: AC
  Filled 2013-09-03: qty 30

## 2013-09-03 MED ORDER — LIDOCAINE HCL (CARDIAC) 20 MG/ML IV SOLN
INTRAVENOUS | Status: DC | PRN
  Administered 2013-09-03: 80 mg via INTRAVENOUS

## 2013-09-03 MED ORDER — OXYCODONE HCL 5 MG PO TABS: 10.0000 mg | ORAL_TABLET | ORAL | Status: DC | PRN

## 2013-09-03 MED ORDER — MIDAZOLAM HCL 2 MG/ML PO SYRP: 12.0000 mg | ORAL_SOLUTION | Freq: Once | ORAL | Status: DC | PRN

## 2013-09-03 MED ORDER — PROPOFOL 10 MG/ML IV EMUL
INTRAVENOUS | Status: AC
  Filled 2013-09-03: qty 50

## 2013-09-03 MED ORDER — DEXTROSE 5 % IV SOLN
3.0000 g | INTRAVENOUS | Status: AC
  Administered 2013-09-03: 3 g via INTRAVENOUS

## 2013-09-03 MED ORDER — BUPIVACAINE-EPINEPHRINE PF 0.5-1:200000 % IJ SOLN
INTRAMUSCULAR | Status: DC | PRN
  Administered 2013-09-03: 25 mL via PERINEURAL

## 2013-09-03 MED ORDER — CEFAZOLIN SODIUM-DEXTROSE 2-3 GM-% IV SOLR
INTRAVENOUS | Status: AC
  Filled 2013-09-03: qty 50

## 2013-09-03 MED ORDER — LACTATED RINGERS IV SOLN
INTRAVENOUS | Status: DC
  Administered 2013-09-03 (×3): via INTRAVENOUS

## 2013-09-03 MED ORDER — BUPIVACAINE HCL (PF) 0.25 % IJ SOLN
INTRAMUSCULAR | Status: AC
  Filled 2013-09-03: qty 30

## 2013-09-03 MED ORDER — MIDAZOLAM HCL 2 MG/2ML IJ SOLN
INTRAMUSCULAR | Status: AC
  Filled 2013-09-03: qty 4

## 2013-09-03 MED ORDER — SODIUM CHLORIDE 0.9 % IR SOLN
Status: DC | PRN
  Administered 2013-09-03: 6000 mL

## 2013-09-03 MED ORDER — MIDAZOLAM HCL 2 MG/2ML IJ SOLN
INTRAMUSCULAR | Status: AC
  Filled 2013-09-03: qty 2

## 2013-09-03 MED ORDER — LIDOCAINE HCL 4 % MT SOLN
OROMUCOSAL | Status: DC | PRN
  Administered 2013-09-03: 5 mL via TOPICAL

## 2013-09-03 MED ORDER — ONDANSETRON HCL 4 MG/2ML IJ SOLN: 4.0000 mg | Freq: Once | INTRAMUSCULAR | Status: DC | PRN

## 2013-09-03 MED ORDER — FENTANYL CITRATE 0.05 MG/ML IJ SOLN
50.0000 ug | INTRAMUSCULAR | Status: DC | PRN
  Administered 2013-09-03: 100 ug via INTRAVENOUS

## 2013-09-03 MED ORDER — FENTANYL CITRATE 0.05 MG/ML IJ SOLN
INTRAMUSCULAR | Status: AC
  Filled 2013-09-03: qty 2

## 2013-09-03 MED ORDER — ACETAMINOPHEN 500 MG PO TABS
1000.0000 mg | ORAL_TABLET | Freq: Once | ORAL | Status: AC
  Administered 2013-09-03: 1000 mg via ORAL

## 2013-09-03 MED ORDER — ONDANSETRON HCL 4 MG/2ML IJ SOLN
INTRAMUSCULAR | Status: DC | PRN
  Administered 2013-09-03: 4 mg via INTRAVENOUS

## 2013-09-03 MED ORDER — PROPOFOL 10 MG/ML IV BOLUS
INTRAVENOUS | Status: AC
  Filled 2013-09-03: qty 20

## 2013-09-03 MED ORDER — DEXAMETHASONE SODIUM PHOSPHATE 4 MG/ML IJ SOLN
INTRAMUSCULAR | Status: DC | PRN
  Administered 2013-09-03: 10 mg via INTRAVENOUS

## 2013-09-03 MED ORDER — ACETAMINOPHEN 500 MG PO TABS
ORAL_TABLET | ORAL | Status: AC
  Filled 2013-09-03: qty 2

## 2013-09-03 MED ORDER — SUCCINYLCHOLINE CHLORIDE 20 MG/ML IJ SOLN
INTRAMUSCULAR | Status: AC
  Filled 2013-09-03: qty 1

## 2013-09-03 MED ORDER — DOCUSATE SODIUM 100 MG PO CAPS: 100.0000 mg | ORAL_CAPSULE | Freq: Two times a day (BID) | ORAL | Status: DC

## 2013-09-03 MED ORDER — HYDROMORPHONE HCL PF 1 MG/ML IJ SOLN: 0.2500 mg | INTRAMUSCULAR | Status: DC | PRN

## 2013-09-03 MED ORDER — MIDAZOLAM HCL 2 MG/2ML IJ SOLN
1.0000 mg | INTRAMUSCULAR | Status: DC | PRN
  Administered 2013-09-03: 2 mg via INTRAVENOUS

## 2013-09-03 MED ORDER — DEXTROSE-NACL 5-0.45 % IV SOLN: 100.0000 mL/h | INTRAVENOUS | Status: DC

## 2013-09-03 MED ORDER — ONDANSETRON HCL 4 MG PO TABS: 4.0000 mg | ORAL_TABLET | Freq: Three times a day (TID) | ORAL | Status: DC | PRN

## 2013-09-03 MED ORDER — OXYCODONE HCL 5 MG PO TABS: 5.0000 mg | ORAL_TABLET | Freq: Once | ORAL | Status: DC | PRN

## 2013-09-03 MED ORDER — EPHEDRINE SULFATE 50 MG/ML IJ SOLN
INTRAMUSCULAR | Status: DC | PRN
  Administered 2013-09-03 (×2): 10 mg via INTRAVENOUS

## 2013-09-03 SURGICAL SUPPLY — 73 items
ANCH SUT SWLK 19.1X4.75 (Anchor) ×1 IMPLANT
ANCHOR SUT BIO SW 4.75X19.1 (Anchor) ×1 IMPLANT
APL SKNCLS STERI-STRIP NONHPOA (GAUZE/BANDAGES/DRESSINGS)
BENZOIN TINCTURE PRP APPL 2/3 (GAUZE/BANDAGES/DRESSINGS) IMPLANT
BLADE AVERAGE 25X9 (BLADE) IMPLANT
BLADE CUTTER GATOR 3.5 (BLADE) ×1 IMPLANT
BLADE CUTTER MENIS 5.5 (BLADE) IMPLANT
BLADE GREAT WHITE 4.2 (BLADE) IMPLANT
BLADE SURG 15 STRL LF DISP TIS (BLADE) IMPLANT
BLADE SURG 15 STRL SS (BLADE)
BUR OVAL 4.0 (BURR) ×1 IMPLANT
BUR OVAL 6.0 (BURR) IMPLANT
CANISTER SUCT 3000ML (MISCELLANEOUS) IMPLANT
CANISTER SUCT LVC 12 LTR MEDI- (MISCELLANEOUS) ×2 IMPLANT
CANNULA DRY DOC 8X75 (CANNULA) IMPLANT
CANNULA TWIST IN 8.25X7CM (CANNULA) ×1 IMPLANT
DECANTER SPIKE VIAL GLASS SM (MISCELLANEOUS) IMPLANT
DRAPE INCISE IOBAN 66X45 STRL (DRAPES) ×1 IMPLANT
DRAPE STERI 35X30 U-POUCH (DRAPES) ×2 IMPLANT
DRAPE U-SHAPE 47X51 STRL (DRAPES) ×2 IMPLANT
DRAPE U-SHAPE 76X120 STRL (DRAPES) ×4 IMPLANT
DURAPREP 26ML APPLICATOR (WOUND CARE) ×3 IMPLANT
ELECT MENISCUS 165MM 90D (ELECTRODE) ×2 IMPLANT
ELECT NDL TIP 2.8 STRL (NEEDLE) IMPLANT
ELECT NEEDLE TIP 2.8 STRL (NEEDLE) IMPLANT
ELECT REM PT RETURN 9FT ADLT (ELECTROSURGICAL) ×2
ELECTRODE REM PT RTRN 9FT ADLT (ELECTROSURGICAL) ×1 IMPLANT
GAUZE XEROFORM 1X8 LF (GAUZE/BANDAGES/DRESSINGS) ×2 IMPLANT
GLOVE BIO SURGEON STRL SZ7.5 (GLOVE) ×3 IMPLANT
GLOVE BIO SURGEON STRL SZ8 (GLOVE) ×1 IMPLANT
GLOVE BIOGEL PI IND STRL 8 (GLOVE) ×2 IMPLANT
GLOVE BIOGEL PI IND STRL 8.5 (GLOVE) IMPLANT
GLOVE BIOGEL PI INDICATOR 8 (GLOVE) ×2
GLOVE BIOGEL PI INDICATOR 8.5 (GLOVE) ×1
GOWN PREVENTION PLUS XLARGE (GOWN DISPOSABLE) ×4 IMPLANT
GOWN PREVENTION PLUS XXLARGE (GOWN DISPOSABLE) ×1 IMPLANT
NDL SCORPION MULTI FIRE (NEEDLE) IMPLANT
NDL SUT 6 .5 CRC .975X.05 MAYO (NEEDLE) IMPLANT
NEEDLE MAYO TAPER (NEEDLE)
NEEDLE SCORPION MULTI FIRE (NEEDLE) ×2 IMPLANT
NS IRRIG 1000ML POUR BTL (IV SOLUTION) IMPLANT
PACK ARTHROSCOPY DSU (CUSTOM PROCEDURE TRAY) ×2 IMPLANT
PACK BASIN DAY SURGERY FS (CUSTOM PROCEDURE TRAY) ×2 IMPLANT
PAD ABD 8X10 STRL (GAUZE/BANDAGES/DRESSINGS) ×2 IMPLANT
PENCIL BUTTON HOLSTER BLD 10FT (ELECTRODE) ×1 IMPLANT
SET ARTHROSCOPY TUBING (MISCELLANEOUS) ×2
SET ARTHROSCOPY TUBING LN (MISCELLANEOUS) ×1 IMPLANT
SLEEVE SCD COMPRESS KNEE MED (MISCELLANEOUS) ×1 IMPLANT
SLING ARM FOAM STRAP LRG (SOFTGOODS) IMPLANT
SLING ARM FOAM STRAP MED (SOFTGOODS) IMPLANT
SLING ARM FOAM STRAP XLG (SOFTGOODS) IMPLANT
SLING ARM IMMOBILIZER LRG (SOFTGOODS) IMPLANT
SLING ARM IMMOBILIZER MED (SOFTGOODS) ×1 IMPLANT
SPONGE GAUZE 4X4 12PLY (GAUZE/BANDAGES/DRESSINGS) ×4 IMPLANT
SPONGE LAP 4X18 X RAY DECT (DISPOSABLE) IMPLANT
STRIP CLOSURE SKIN 1/2X4 (GAUZE/BANDAGES/DRESSINGS) IMPLANT
SUCTION FRAZIER TIP 10 FR DISP (SUCTIONS) IMPLANT
SUT ETHIBOND 2 OS 4 DA (SUTURE) IMPLANT
SUT ETHILON 2 0 FS 18 (SUTURE) IMPLANT
SUT ETHILON 3 0 PS 1 (SUTURE) ×1 IMPLANT
SUT FIBERWIRE #2 38 T-5 BLUE (SUTURE)
SUT TIGER TAPE 7 IN WHITE (SUTURE) ×1 IMPLANT
SUT VIC AB 0 CT1 27 (SUTURE)
SUT VIC AB 0 CT1 27XBRD ANBCTR (SUTURE) IMPLANT
SUT VIC AB 2-0 SH 27 (SUTURE)
SUT VIC AB 2-0 SH 27XBRD (SUTURE) IMPLANT
SUT VIC AB 3-0 FS2 27 (SUTURE) IMPLANT
SUTURE FIBERWR #2 38 T-5 BLUE (SUTURE) IMPLANT
TAPE FIBER 2MM 7IN #2 BLUE (SUTURE) ×1 IMPLANT
TOWEL OR 17X24 6PK STRL BLUE (TOWEL DISPOSABLE) ×2 IMPLANT
WAND STAR VAC 90 (SURGICAL WAND) ×2 IMPLANT
WATER STERILE IRR 1000ML POUR (IV SOLUTION) ×2 IMPLANT
YANKAUER SUCT BULB TIP NO VENT (SUCTIONS) IMPLANT

## 2013-09-03 NOTE — Interval H&P Note (Signed)
History and Physical Interval Note:  09/03/2013 12:51 PM  Alec [name]  has presented today for surgery, with the diagnosis of LEFT SHOULDER DEGENERATIVE ARTHRITIS, BURSAE AND TENDON DISORDER AND PAIN  The various methods of treatment have been discussed with the patient and family. After consideration of risks, benefits and other options for treatment, the patient has consented to  Procedure(s): LEFT SHOULDER ARTHROSCOPY WITH EXTENSIVE DEBRIDMENT, DISTAL CLAVICULECTOMY, ROTATOR CUFF REPAIR AND SUBACROMIAL DECOMPRESSION PARTIAL ACRIOMIOPLASTY WITH CORACOACROMIAL RELEASE (Left) as a surgical intervention .  The patient's history has been reviewed, patient examined, no change in status, stable for surgery.  I have reviewed the patient's chart and labs.  Questions were answered to the patient's satisfaction.     Mohd Clemons, D

## 2013-09-03 NOTE — Progress Notes (Signed)
Assisted Dr. Al Corpus with left, ultrasound guided, interscalene  block. Side rails up, monitors on throughout procedure. See vital signs in flow sheet. Tolerated Procedure well.

## 2013-09-03 NOTE — Anesthesia Procedure Notes (Addendum)
Anesthesia Regional Block:   Narrative:    Anesthesia Regional Block:  Interscalene brachial plexus block  Pre-Anesthetic Checklist: ,, timeout performed, Correct Patient, Correct Site, Correct Laterality, Correct Procedure, Correct Position, site marked, Risks and benefits discussed,  Surgical consent,  Pre-op evaluation,  At surgeon's request and post-op pain management  Laterality: Left and Upper  Prep: chloraprep       Needles:  Injection technique: Single-shot  Needle Type: Echogenic Needle     Needle Length: 5cm 5 cm Needle Gauge: 21 and 21 G    Additional Needles:  Procedures: ultrasound guided (picture in chart) Interscalene brachial plexus block Narrative:  Start time: 09/03/2013 11:34 AM End time: 09/03/2013 11:39 AM Injection made incrementally with aspirations every 5 mL.  Performed by: Personally  Anesthesiologist: Lorrene Reid, MD   Procedure Name: Intubation Date/Time: 09/03/2013 12:49 PM Performed by: Maryella Shivers Pre-anesthesia Checklist: Patient identified, Emergency Drugs available, Suction available and Patient being monitored Patient Re-evaluated:Patient Re-evaluated prior to inductionOxygen Delivery Method: Circle System Utilized Preoxygenation: Pre-oxygenation with 100% oxygen Intubation Type: IV induction Ventilation: Mask ventilation without difficulty Laryngoscope Size: Mac and 3 Grade View: Grade I Tube type: Oral Tube size: 8.0 mm Number of attempts: 1 Airway Equipment and Method: stylet Placement Confirmation: ETT inserted through vocal cords under direct vision,  positive ETCO2 and breath sounds checked- equal and bilateral Secured at: 22 cm Tube secured with: Tape Dental Injury: Teeth and Oropharynx as per pre-operative assessment

## 2013-09-03 NOTE — Op Note (Signed)
09/03/2013  2:56 PM  PATIENT:  Alec [name]    PRE-OPERATIVE DIAGNOSIS:  LEFT SHOULDER DEGENERATIVE ARTHRITIS, BURSAE AND TENDON DISORDER AND PAIN  POST-OPERATIVE DIAGNOSIS:  Same  PROCEDURE:  LEFT SHOULDER ARTHROSCOPY WITH EXTENSIVE DEBRIDMENT, DISTAL CLAVICULECTOMY, ROTATOR CUFF REPAIR AND SUBACROMIAL DECOMPRESSION PARTIAL ACRIOMIOPLASTY WITH CORACOACROMIAL RELEASE  SURGEON:  Lety Cullens, D, MD  ASSISTANT: Joya Gaskins OPA  ANESTHESIA:   General  PREOPERATIVE INDICATIONS:  Alec [name] is a  67 y.o. male with a diagnosis of LEFT SHOULDER DEGENERATIVE ARTHRITIS, BURSAE AND TENDON DISORDER AND PAIN who failed conservative measures and elected for surgical management.    The risks benefits and alternatives were discussed with the patient preoperatively including but not limited to the risks of infection, bleeding, nerve injury, cardiopulmonary complications, the need for revision surgery, among others, and the patient was willing to proceed.  OPERATIVE IMPLANTS: Arthrex pushlock x 1  OPERATIVE FINDINGS: Small full-thickness tear of the supraspinatus tendon with minimal retraction an anterior aspect of its insertion. Superior labral anterior posterior tear and biceps sling fraying. Subacromial impingement and distal clavicle arthrosis.  BLOOD LOSS: minimal  COMPLICATIONS: None  OPERATIVE PROCEDURE:  Patient was identified in the preoperative holding area and site was marked by me He was transported to the operating theater and placed on the table in beach chair position taking care to pad all bony prominences. After a preincinduction time out anesthesia was induced. The left upper extremity was prepped and draped in normal sterile fashion and a pre-incision timeout was performed. Alec [name] received Ancef 3 g for preoperative antibiotics.   Initially made a posterior arthroscopic portal and inserted the arthroscope into the glenohumeral joint. tour of the joint demonstrated  the above operative findings  I created an anterior portal just lateral to the coracoid under direct visualization using a spinal needle.  I used a combination of biter and shaver to release the biceps tendon from the superior labrum and then used the shaver to debride the superior labrum to a smooth rim.  I then introduced the arthroscope into the subacromial space and brought the shaver into the anterior portal. I debrided the bursa for appropriate visualization.  Examination of the bursal side of the cuff demonstrated the full-thickness supraspinatus tear. I performed a debridement of the tear and determined that a single push lock with fiber tape would be sufficient for repair. I debrided the footprint in March a good spot for the push lock. I then used a scorpion to pass the fiber tape on the anterior aspect and posterior aspect of the tear creating a horizontal mattress stitch. I then punched and placed a push lock the lateral aspect of the supraspinatus insertion. I was very happy with the opposition of the cuff onto its footprint in the push lock was stable he placed.  I then performed a subacromial decompression using combination of the shaver ArthroCare and burr using a cutting block technique. As happy with the final elevation of the subacromial space on multiple portal views.  Next I turned my attention to the distal clavicle and through the anterior portal using the bur and shaver I was able to perform a distal clavicle excision. I then switched portals and inserted the arthroscope into the anterior portal and was happy with an appropriate resection of the distal clavicle.  Next I removed all arthroscopic equipment expressed all fluid and closed the portals with a nylon stitch. A sterile dressing was applied the patient was taken the PACU in  stable condition.  POST OPERATIVE PLAN: The patient will be in a sling full-time and keep the dressings clean dry and intact. DVT prophylaxis will  consist of early ambulation and ASA 81mg

## 2013-09-03 NOTE — Anesthesia Postprocedure Evaluation (Signed)
  Anesthesia Post-op Note  Patient: Alec [name]  Procedure(s) Performed: Procedure(s): LEFT SHOULDER ARTHROSCOPY WITH EXTENSIVE DEBRIDMENT, DISTAL CLAVICULECTOMY, ROTATOR CUFF REPAIR AND SUBACROMIAL DECOMPRESSION PARTIAL ACRIOMIOPLASTY WITH CORACOACROMIAL RELEASE (Left)  Patient Location: PACU  Anesthesia Type:General and GA combined with regional for post-op pain  Level of Consciousness: awake, alert  and oriented  Airway and Oxygen Therapy: Patient Spontanous Breathing and Patient connected to face mask oxygen  Post-op Pain: none  Post-op Assessment: Post-op Vital signs reviewed  Post-op Vital Signs: Reviewed  Complications: No apparent anesthesia complications

## 2013-09-03 NOTE — Anesthesia Preprocedure Evaluation (Signed)
Anesthesia Evaluation  Patient identified by MRN, date of birth, ID band Patient awake    Reviewed: Allergy & Precautions, H&P , NPO status , Patient's Chart, lab work & pertinent test results  Airway Mallampati: I TM Distance: >3 FB Neck ROM: Full    Dental  (+) Teeth Intact and Dental Advisory Given   Pulmonary former smoker,  breath sounds clear to auscultation        Cardiovascular hypertension, Pt. on medications Rhythm:Regular Rate:Normal     Neuro/Psych    GI/Hepatic   Endo/Other  diabetes, Well Controlled, Type 2, Insulin Dependent  Renal/GU      Musculoskeletal   Abdominal   Peds  Hematology   Anesthesia Other Findings   Reproductive/Obstetrics                           Anesthesia Physical Anesthesia Plan  ASA: III  Anesthesia Plan: General   Post-op Pain Management:    Induction: Intravenous  Airway Management Planned: Oral ETT  Additional Equipment:   Intra-op Plan:   Post-operative Plan: Extubation in OR  Informed Consent: I have reviewed the patients History and Physical, chart, labs and discussed the procedure including the risks, benefits and alternatives for the proposed anesthesia with the patient or authorized representative who has indicated his/her understanding and acceptance.   Dental advisory given  Plan Discussed with: CRNA, Anesthesiologist and Surgeon  Anesthesia Plan Comments:         Anesthesia Quick Evaluation

## 2013-09-03 NOTE — Transfer of Care (Signed)
Immediate Anesthesia Transfer of Care Note  Patient: Alec [name]  Procedure(s) Performed: Procedure(s): LEFT SHOULDER ARTHROSCOPY WITH EXTENSIVE DEBRIDMENT, DISTAL CLAVICULECTOMY, ROTATOR CUFF REPAIR AND SUBACROMIAL DECOMPRESSION PARTIAL ACRIOMIOPLASTY WITH CORACOACROMIAL RELEASE (Left)  Patient Location: PACU  Anesthesia Type:GA combined with regional for post-op pain  Level of Consciousness: awake, alert  and oriented  Airway & Oxygen Therapy: Patient Spontanous Breathing and Patient connected to face mask oxygen  Post-op Assessment: Report given to PACU RN and Post -op Vital signs reviewed and stable  Post vital signs: Reviewed and stable  Complications: No apparent anesthesia complications

## 2013-09-08 ENCOUNTER — Encounter (HOSPITAL_BASED_OUTPATIENT_CLINIC_OR_DEPARTMENT_OTHER): Payer: Self-pay | Admitting: Orthopedic Surgery

## 2013-09-15 DIAGNOSIS — Z4789 Encounter for other orthopedic aftercare: Secondary | ICD-10-CM | POA: Diagnosis not present

## 2013-09-21 DIAGNOSIS — M25519 Pain in unspecified shoulder: Secondary | ICD-10-CM | POA: Diagnosis not present

## 2013-09-21 DIAGNOSIS — M75120 Complete rotator cuff tear or rupture of unspecified shoulder, not specified as traumatic: Secondary | ICD-10-CM | POA: Diagnosis not present

## 2013-09-28 DIAGNOSIS — M6281 Muscle weakness (generalized): Secondary | ICD-10-CM | POA: Diagnosis not present

## 2013-09-28 DIAGNOSIS — M25519 Pain in unspecified shoulder: Secondary | ICD-10-CM | POA: Diagnosis not present

## 2013-09-28 DIAGNOSIS — M75120 Complete rotator cuff tear or rupture of unspecified shoulder, not specified as traumatic: Secondary | ICD-10-CM | POA: Diagnosis not present

## 2013-10-01 DIAGNOSIS — M19019 Primary osteoarthritis, unspecified shoulder: Secondary | ICD-10-CM | POA: Diagnosis not present

## 2013-10-01 DIAGNOSIS — M75120 Complete rotator cuff tear or rupture of unspecified shoulder, not specified as traumatic: Secondary | ICD-10-CM | POA: Diagnosis not present

## 2013-10-01 DIAGNOSIS — M25519 Pain in unspecified shoulder: Secondary | ICD-10-CM | POA: Diagnosis not present

## 2013-10-05 DIAGNOSIS — M75120 Complete rotator cuff tear or rupture of unspecified shoulder, not specified as traumatic: Secondary | ICD-10-CM | POA: Diagnosis not present

## 2013-10-05 DIAGNOSIS — M25519 Pain in unspecified shoulder: Secondary | ICD-10-CM | POA: Diagnosis not present

## 2013-10-05 DIAGNOSIS — M6281 Muscle weakness (generalized): Secondary | ICD-10-CM | POA: Diagnosis not present

## 2013-10-07 DIAGNOSIS — M6281 Muscle weakness (generalized): Secondary | ICD-10-CM | POA: Diagnosis not present

## 2013-10-07 DIAGNOSIS — M25519 Pain in unspecified shoulder: Secondary | ICD-10-CM | POA: Diagnosis not present

## 2013-10-07 DIAGNOSIS — M75120 Complete rotator cuff tear or rupture of unspecified shoulder, not specified as traumatic: Secondary | ICD-10-CM | POA: Diagnosis not present

## 2013-10-12 DIAGNOSIS — M75120 Complete rotator cuff tear or rupture of unspecified shoulder, not specified as traumatic: Secondary | ICD-10-CM | POA: Diagnosis not present

## 2013-10-12 DIAGNOSIS — M25519 Pain in unspecified shoulder: Secondary | ICD-10-CM | POA: Diagnosis not present

## 2013-10-12 DIAGNOSIS — M6281 Muscle weakness (generalized): Secondary | ICD-10-CM | POA: Diagnosis not present

## 2013-10-13 DIAGNOSIS — J069 Acute upper respiratory infection, unspecified: Secondary | ICD-10-CM | POA: Diagnosis not present

## 2013-10-14 DIAGNOSIS — M6281 Muscle weakness (generalized): Secondary | ICD-10-CM | POA: Diagnosis not present

## 2013-10-14 DIAGNOSIS — M75120 Complete rotator cuff tear or rupture of unspecified shoulder, not specified as traumatic: Secondary | ICD-10-CM | POA: Diagnosis not present

## 2013-10-14 DIAGNOSIS — M25519 Pain in unspecified shoulder: Secondary | ICD-10-CM | POA: Diagnosis not present

## 2013-10-19 DIAGNOSIS — M75120 Complete rotator cuff tear or rupture of unspecified shoulder, not specified as traumatic: Secondary | ICD-10-CM | POA: Diagnosis not present

## 2013-10-19 DIAGNOSIS — M25519 Pain in unspecified shoulder: Secondary | ICD-10-CM | POA: Diagnosis not present

## 2013-10-19 DIAGNOSIS — M6281 Muscle weakness (generalized): Secondary | ICD-10-CM | POA: Diagnosis not present

## 2013-10-21 DIAGNOSIS — M6281 Muscle weakness (generalized): Secondary | ICD-10-CM | POA: Diagnosis not present

## 2013-10-21 DIAGNOSIS — M25519 Pain in unspecified shoulder: Secondary | ICD-10-CM | POA: Diagnosis not present

## 2013-10-21 DIAGNOSIS — M75120 Complete rotator cuff tear or rupture of unspecified shoulder, not specified as traumatic: Secondary | ICD-10-CM | POA: Diagnosis not present

## 2013-10-26 DIAGNOSIS — M25519 Pain in unspecified shoulder: Secondary | ICD-10-CM | POA: Diagnosis not present

## 2013-10-26 DIAGNOSIS — M75120 Complete rotator cuff tear or rupture of unspecified shoulder, not specified as traumatic: Secondary | ICD-10-CM | POA: Diagnosis not present

## 2013-10-26 DIAGNOSIS — M6281 Muscle weakness (generalized): Secondary | ICD-10-CM | POA: Diagnosis not present

## 2013-10-28 DIAGNOSIS — M6281 Muscle weakness (generalized): Secondary | ICD-10-CM | POA: Diagnosis not present

## 2013-10-28 DIAGNOSIS — M25519 Pain in unspecified shoulder: Secondary | ICD-10-CM | POA: Diagnosis not present

## 2013-10-28 DIAGNOSIS — M75120 Complete rotator cuff tear or rupture of unspecified shoulder, not specified as traumatic: Secondary | ICD-10-CM | POA: Diagnosis not present

## 2013-11-02 DIAGNOSIS — M6281 Muscle weakness (generalized): Secondary | ICD-10-CM | POA: Diagnosis not present

## 2013-11-02 DIAGNOSIS — M25519 Pain in unspecified shoulder: Secondary | ICD-10-CM | POA: Diagnosis not present

## 2013-11-02 DIAGNOSIS — M75120 Complete rotator cuff tear or rupture of unspecified shoulder, not specified as traumatic: Secondary | ICD-10-CM | POA: Diagnosis not present

## 2013-11-04 DIAGNOSIS — M75120 Complete rotator cuff tear or rupture of unspecified shoulder, not specified as traumatic: Secondary | ICD-10-CM | POA: Diagnosis not present

## 2013-11-04 DIAGNOSIS — M25519 Pain in unspecified shoulder: Secondary | ICD-10-CM | POA: Diagnosis not present

## 2013-11-04 DIAGNOSIS — M6281 Muscle weakness (generalized): Secondary | ICD-10-CM | POA: Diagnosis not present

## 2013-11-09 DIAGNOSIS — M25519 Pain in unspecified shoulder: Secondary | ICD-10-CM | POA: Diagnosis not present

## 2013-11-09 DIAGNOSIS — M6281 Muscle weakness (generalized): Secondary | ICD-10-CM | POA: Diagnosis not present

## 2013-11-09 DIAGNOSIS — M75120 Complete rotator cuff tear or rupture of unspecified shoulder, not specified as traumatic: Secondary | ICD-10-CM | POA: Diagnosis not present

## 2013-11-11 ENCOUNTER — Ambulatory Visit (INDEPENDENT_AMBULATORY_CARE_PROVIDER_SITE_OTHER): Payer: Medicare Other | Admitting: Emergency Medicine

## 2013-11-11 VITALS — BP 120/80 | HR 63 | Temp 98.0°F | Resp 16 | Ht 67.0 in | Wt 181.0 lb

## 2013-11-11 DIAGNOSIS — H6120 Impacted cerumen, unspecified ear: Secondary | ICD-10-CM | POA: Diagnosis not present

## 2013-11-11 DIAGNOSIS — H9120 Sudden idiopathic hearing loss, unspecified ear: Secondary | ICD-10-CM

## 2013-11-11 NOTE — Progress Notes (Signed)
[facility] [address], [place] [address] [phone]  Date:  11/11/2013   Name:  Alec [name]   DOB:  09/25/1946   MRN:  [id number]  PCP:  Mayra Neer, MD    Chief Complaint: Ear Fullness   History of Present Illness:  Alec [name] is a 68 y.o. very pleasant male patient who presents with the following:  Says he can't hear and has pressure in his ears.  Has quarterly need to have them irrigated.  No fever or chills. Cough or coryza.  No improvement with over the counter medications or other home remedies. Denies other complaint or health concern today.   Patient Active Problem List   Diagnosis Date Noted  . Diabetes 08/29/2012  . HTN (hypertension) 08/29/2012  . Dyslipidemia 08/29/2012  . Abdominal pain 08/29/2012  . Acute cholecystitis 08/29/2012    Past Medical History  Diagnosis Date  . IDDM (insulin dependent diabetes mellitus)   . Hypertension     under control with meds., has been on med. > 10 yr.  . Degenerative arthritis of shoulder region 08/2013    left  . Disorder of bursae and tendons in left shoulder region 08/2013    Past Surgical History  Procedure Laterality Date  . Cholecystectomy  08/29/2012    Procedure: LAPAROSCOPIC CHOLECYSTECTOMY WITH INTRAOPERATIVE CHOLANGIOGRAM;  Surgeon: Imogene Burn. Tsuei, MD;  Location: [facility];  Service: General;  Laterality: N/A;  . Transurethral resection of bladder tumor with mitomycin-c  08/24/2008  . Shoulder arthroscopy with subacromial decompression, rotator cuff repair and bicep tendon repair Left 09/03/2013    Procedure: LEFT SHOULDER ARTHROSCOPY WITH EXTENSIVE DEBRIDMENT, DISTAL CLAVICULECTOMY, ROTATOR CUFF REPAIR AND SUBACROMIAL DECOMPRESSION PARTIAL ACRIOMIOPLASTY WITH CORACOACROMIAL RELEASE;  Surgeon: Renette Butters, MD;  Location: [place];  Service: Orthopedics;  Laterality: Left;    History  Substance Use Topics  . Smoking status: Former Smoker    Quit  date: 09/11/1997  . Smokeless tobacco: Never Used  . Alcohol Use: No    History reviewed. No pertinent family history.  No Known Allergies  Medication list has been reviewed and updated.  Current Outpatient Prescriptions on File Prior to Visit  Medication Sig Dispense Refill  . atorvastatin (LIPITOR) 20 MG tablet Take 20 mg by mouth daily.      . Cholecalciferol (VITAMIN D3) 3000 UNITS TABS Take 1 tablet by mouth daily.      . Cinnamon 500 MG TABS Take 1,000 mg by mouth daily.      . colesevelam (WELCHOL) 625 MG tablet Take 625 mg by mouth 2 (two) times daily with a meal. Taking 1 packet of powder at night      . docusate sodium (COLACE) 100 MG capsule Take 1 capsule (100 mg total) by mouth 2 (two) times daily. Continue this while taking narcotics to help with bowel movements  30 capsule  1  . fish oil-omega-3 fatty acids 1000 MG capsule Take 2 g by mouth daily.      Marland Kitchen gemfibrozil (LOPID) 600 MG tablet Take 600 mg by mouth 2 (two) times daily before a meal.      . glimepiride (AMARYL) 4 MG tablet Take 4 mg by mouth daily before breakfast.      . insulin aspart (NOVOLOG) 100 UNIT/ML injection Inject 12 Units into the skin daily. Pt uses sliding scale      . insulin detemir (LEVEMIR) 100 UNIT/ML injection Inject 28 Units into the skin daily before  breakfast.       . linagliptin (TRADJENTA) 5 MG TABS tablet Take 5 mg by mouth daily.      Marland Kitchen lisinopril-hydrochlorothiazide (PRINZIDE,ZESTORETIC) 20-12.5 MG per tablet Take 1 tablet by mouth daily.      . Multiple Vitamin (MULTIVITAMIN) tablet Take 1 tablet by mouth daily.      Marland Kitchen amLODipine (NORVASC) 10 MG tablet Take 10 mg by mouth daily.      Marland Kitchen aspirin 81 MG tablet Take 81 mg by mouth daily.      . ondansetron (ZOFRAN) 4 MG tablet Take 1 tablet (4 mg total) by mouth every 8 (eight) hours as needed for nausea.  60 tablet  0  . oxyCODONE (ROXICODONE) 5 MG immediate release tablet Take 2 tablets (10 mg total) by mouth every 4 (four) hours as  needed for severe pain.  90 tablet  0  . sertraline (ZOLOFT) 100 MG tablet Take 150 mg by mouth daily.        No current facility-administered medications on file prior to visit.    Review of Systems:  As per HPI, otherwise negative.    Physical Examination: Filed Vitals:   11/11/13 1927  BP: 120/80  Pulse: 63  Temp: 98 F (36.7 C)  Resp: 16   Filed Vitals:   11/11/13 1927  Height: 5\' 7"  (1.702 m)  Weight: 181 lb (82.101 kg)   Body mass index is 28.34 kg/(m^2). Ideal Body Weight: Weight in (lb) to have BMI = 25: 159.3   GEN: WDWN, NAD, Non-toxic, Alert & Oriented x 3 HEENT: Atraumatic, Normocephalic.  Ears and Nose: No external deformity.  Bilateral cerumen impaction EXTR: No clubbing/cyanosis/edema NEURO: Normal gait.  PSYCH: Normally interactive. Conversant. Not depressed or anxious appearing.  Calm demeanor.    Assessment and Plan: Hearing loss Cerumen impaction Signed,  Ellison Carwin, MD

## 2013-11-16 DIAGNOSIS — M6281 Muscle weakness (generalized): Secondary | ICD-10-CM | POA: Diagnosis not present

## 2013-11-16 DIAGNOSIS — M75120 Complete rotator cuff tear or rupture of unspecified shoulder, not specified as traumatic: Secondary | ICD-10-CM | POA: Diagnosis not present

## 2013-11-16 DIAGNOSIS — M25519 Pain in unspecified shoulder: Secondary | ICD-10-CM | POA: Diagnosis not present

## 2013-11-18 DIAGNOSIS — M6281 Muscle weakness (generalized): Secondary | ICD-10-CM | POA: Diagnosis not present

## 2013-11-18 DIAGNOSIS — M75120 Complete rotator cuff tear or rupture of unspecified shoulder, not specified as traumatic: Secondary | ICD-10-CM | POA: Diagnosis not present

## 2013-11-18 DIAGNOSIS — M25519 Pain in unspecified shoulder: Secondary | ICD-10-CM | POA: Diagnosis not present

## 2014-01-04 DIAGNOSIS — E1165 Type 2 diabetes mellitus with hyperglycemia: Secondary | ICD-10-CM | POA: Diagnosis not present

## 2014-01-04 DIAGNOSIS — Z23 Encounter for immunization: Secondary | ICD-10-CM | POA: Diagnosis not present

## 2014-01-04 DIAGNOSIS — I10 Essential (primary) hypertension: Secondary | ICD-10-CM | POA: Diagnosis not present

## 2014-01-04 DIAGNOSIS — E782 Mixed hyperlipidemia: Secondary | ICD-10-CM | POA: Diagnosis not present

## 2014-01-04 DIAGNOSIS — E1129 Type 2 diabetes mellitus with other diabetic kidney complication: Secondary | ICD-10-CM | POA: Diagnosis not present

## 2014-01-04 DIAGNOSIS — N058 Unspecified nephritic syndrome with other morphologic changes: Secondary | ICD-10-CM | POA: Diagnosis not present

## 2014-01-04 DIAGNOSIS — N183 Chronic kidney disease, stage 3 unspecified: Secondary | ICD-10-CM | POA: Diagnosis not present

## 2014-01-04 DIAGNOSIS — Z0000 Encounter for general adult medical examination without abnormal findings: Secondary | ICD-10-CM | POA: Diagnosis not present

## 2014-01-12 DIAGNOSIS — R809 Proteinuria, unspecified: Secondary | ICD-10-CM | POA: Diagnosis not present

## 2014-01-31 DIAGNOSIS — N183 Chronic kidney disease, stage 3 unspecified: Secondary | ICD-10-CM | POA: Diagnosis not present

## 2014-03-09 DIAGNOSIS — R809 Proteinuria, unspecified: Secondary | ICD-10-CM | POA: Diagnosis not present

## 2014-03-15 ENCOUNTER — Encounter: Payer: Medicare Other | Attending: Family Medicine | Admitting: *Deleted

## 2014-03-15 ENCOUNTER — Encounter: Payer: Self-pay | Admitting: *Deleted

## 2014-03-15 VITALS — Ht 66.0 in | Wt 180.5 lb

## 2014-03-15 DIAGNOSIS — Z713 Dietary counseling and surveillance: Secondary | ICD-10-CM | POA: Insufficient documentation

## 2014-03-15 DIAGNOSIS — E119 Type 2 diabetes mellitus without complications: Secondary | ICD-10-CM | POA: Insufficient documentation

## 2014-03-15 DIAGNOSIS — E1165 Type 2 diabetes mellitus with hyperglycemia: Secondary | ICD-10-CM

## 2014-03-15 NOTE — Progress Notes (Signed)
  Medical Nutrition Therapy:  Appt start time: 1700 end time:  1730.  Assessment:  Primary concerns today: diabetes. Here with his wife who appears supportive. History of diabetes for over 10 years. SMBG once a day in the AM with reported range of 85-170 mg/dl. Previously they were all running above 200 every AM. 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 Walks about a mile 2-3 days a week with his wife.   Preferred Learning Style:   No preference indicated   Learning Readiness:   Ready  Change in progress  MEDICATIONS: see list, diabetes medications are Tradjenta, Glimepiride, Levemir and Novolog   DIETARY INTAKE: 24-hr recall:  B ( AM): 1 pop tart OR bacon and eggs, with grits OR biscuit with egg, black coffee or with cream and Splenda Snk ( AM): the other pop tart, OR Ritz Crackers with cream cheese   L ( PM): brings from home a sandwich OR fast food: 2 burgers, no fries, OR BBQ sandwich OR fried chicken with mashed potato and a biscuit, water or diet soda Snk ( PM): OR bag of chips D ( PM): pinto beans, potatoes and cornbread OR chicken wings, steamed vegetables, OR other meat, starch, vegetables, water or diet soda Snk ( PM): occasionally small cupcake OR 1/2 pop tart OR pistachio nuts Beverages: coffee, water or diet soda   Usual physical activity: Walks about a mile 2-3 days a week with his wife.    Estimated energy needs: 1500 calories 170 g carbohydrates 112 g protein 42 g fat  Progress Towards Goal(s):  In progress.   Nutritional Diagnosis:  NB-1.1 Food and nutrition-related knowledge deficit As related to diabetes.  As evidenced by A1c of 8.4%.    Intervention:  Nutrition counseling and diabetes education initiated. Discussed Carb Counting by food group as additional method of portion control, reading food labels, and benefits of increased activity. Also discussed hypoglycemia, causes, symptoms and treatment options.   Plan:  Aim  for 3 Carb Choices per meal (45 grams) +/- 1 either way  Aim for 0-1 Carbs per snack if hungry  Include protein in moderation with your meals and snacks Consider reading food labels for Total Carbohydrate of foods  Teaching Method Utilized: Visual, Auditory and Hands on  Handouts given during visit include: Living Well with Diabetes Carb Counting and Food Label handouts Meal Plan Card  Diabetes medication list  Barriers to learning/adherence to lifestyle change: none  Demonstrated degree of understanding via:  Teach Back   Monitoring/Evaluation:  Dietary intake, exercise, reading food labels, and body weight prn.

## 2014-03-15 NOTE — Patient Instructions (Signed)
Plan:  Aim for 3 Carb Choices per meal (45 grams) +/- 1 either way  Aim for 0-1 Carbs per snack if hungry  Include protein in moderation with your meals and snacks Consider reading food labels for Total Carbohydrate of foods

## 2014-04-28 DIAGNOSIS — E1129 Type 2 diabetes mellitus with other diabetic kidney complication: Secondary | ICD-10-CM | POA: Diagnosis not present

## 2014-04-28 DIAGNOSIS — I10 Essential (primary) hypertension: Secondary | ICD-10-CM | POA: Diagnosis not present

## 2014-04-28 DIAGNOSIS — N183 Chronic kidney disease, stage 3 unspecified: Secondary | ICD-10-CM | POA: Diagnosis not present

## 2014-04-28 DIAGNOSIS — E782 Mixed hyperlipidemia: Secondary | ICD-10-CM | POA: Diagnosis not present

## 2014-04-28 DIAGNOSIS — E1165 Type 2 diabetes mellitus with hyperglycemia: Secondary | ICD-10-CM | POA: Diagnosis not present

## 2014-05-05 DIAGNOSIS — R809 Proteinuria, unspecified: Secondary | ICD-10-CM | POA: Diagnosis not present

## 2014-05-05 DIAGNOSIS — N183 Chronic kidney disease, stage 3 unspecified: Secondary | ICD-10-CM | POA: Diagnosis not present

## 2014-05-10 DIAGNOSIS — N039 Chronic nephritic syndrome with unspecified morphologic changes: Secondary | ICD-10-CM | POA: Diagnosis not present

## 2014-05-10 DIAGNOSIS — R809 Proteinuria, unspecified: Secondary | ICD-10-CM | POA: Diagnosis not present

## 2014-05-10 DIAGNOSIS — D631 Anemia in chronic kidney disease: Secondary | ICD-10-CM | POA: Diagnosis not present

## 2014-05-10 DIAGNOSIS — I129 Hypertensive chronic kidney disease with stage 1 through stage 4 chronic kidney disease, or unspecified chronic kidney disease: Secondary | ICD-10-CM | POA: Diagnosis not present

## 2014-05-10 DIAGNOSIS — N183 Chronic kidney disease, stage 3 unspecified: Secondary | ICD-10-CM | POA: Diagnosis not present

## 2014-07-02 DIAGNOSIS — Z23 Encounter for immunization: Secondary | ICD-10-CM | POA: Diagnosis not present

## 2014-08-02 DIAGNOSIS — N2581 Secondary hyperparathyroidism of renal origin: Secondary | ICD-10-CM | POA: Diagnosis not present

## 2014-08-02 DIAGNOSIS — M150 Primary generalized (osteo)arthritis: Secondary | ICD-10-CM | POA: Diagnosis not present

## 2014-08-02 DIAGNOSIS — I129 Hypertensive chronic kidney disease with stage 1 through stage 4 chronic kidney disease, or unspecified chronic kidney disease: Secondary | ICD-10-CM | POA: Diagnosis not present

## 2014-08-02 DIAGNOSIS — E782 Mixed hyperlipidemia: Secondary | ICD-10-CM | POA: Diagnosis not present

## 2014-08-02 DIAGNOSIS — M109 Gout, unspecified: Secondary | ICD-10-CM | POA: Diagnosis not present

## 2014-08-02 DIAGNOSIS — N183 Chronic kidney disease, stage 3 (moderate): Secondary | ICD-10-CM | POA: Diagnosis not present

## 2014-08-02 DIAGNOSIS — E1122 Type 2 diabetes mellitus with diabetic chronic kidney disease: Secondary | ICD-10-CM | POA: Diagnosis not present

## 2014-08-15 ENCOUNTER — Ambulatory Visit (INDEPENDENT_AMBULATORY_CARE_PROVIDER_SITE_OTHER): Payer: Medicare Other | Admitting: Emergency Medicine

## 2014-08-15 VITALS — BP 138/60 | HR 66 | Temp 98.0°F | Resp 18 | Ht 66.5 in | Wt 180.0 lb

## 2014-08-15 DIAGNOSIS — H6123 Impacted cerumen, bilateral: Secondary | ICD-10-CM

## 2014-08-15 NOTE — Progress Notes (Signed)
[facility] [address], [place] [address] [phone]  Date:  08/15/2014   Name:  Alec [name]   DOB:  1946/06/20   MRN:  [id number]  PCP:  Mayra Neer, MD    Chief Complaint: Ears are stopped up   History of Present Illness:  Alec [name] is a 68 y.o. very pleasant male patient who presents with the following:  Decreased hearing in both ears with tinnitus.  No history of injury No improvement with over the counter medications or other home remedies.  Denies other complaint or health concern today.   Patient Active Problem List   Diagnosis Date Noted  . Diabetes 08/29/2012  . HTN (hypertension) 08/29/2012  . Dyslipidemia 08/29/2012  . Abdominal pain 08/29/2012  . Acute cholecystitis 08/29/2012    Past Medical History  Diagnosis Date  . IDDM (insulin dependent diabetes mellitus)   . Hypertension     under control with meds., has been on med. > 10 yr.  . Degenerative arthritis of shoulder region 08/2013    left  . Disorder of bursae and tendons in left shoulder region 08/2013  . Hyperlipidemia     Past Surgical History  Procedure Laterality Date  . Cholecystectomy  08/29/2012    Procedure: LAPAROSCOPIC CHOLECYSTECTOMY WITH INTRAOPERATIVE CHOLANGIOGRAM;  Surgeon: Imogene Burn. Tsuei, MD;  Location: [facility];  Service: General;  Laterality: N/A;  . Transurethral resection of bladder tumor with mitomycin-c  08/24/2008  . Shoulder arthroscopy with subacromial decompression, rotator cuff repair and bicep tendon repair Left 09/03/2013    Procedure: LEFT SHOULDER ARTHROSCOPY WITH EXTENSIVE DEBRIDMENT, DISTAL CLAVICULECTOMY, ROTATOR CUFF REPAIR AND SUBACROMIAL DECOMPRESSION PARTIAL ACRIOMIOPLASTY WITH CORACOACROMIAL RELEASE;  Surgeon: Renette Butters, MD;  Location: [place];  Service: Orthopedics;  Laterality: Left;    History  Substance Use Topics  . Smoking status: Former Smoker    Quit date: 09/11/1997  . Smokeless  tobacco: Never Used  . Alcohol Use: No    History reviewed. No pertinent family history.  No Known Allergies  Medication list has been reviewed and updated.  Current Outpatient Prescriptions on File Prior to Visit  Medication Sig Dispense Refill  . amLODipine (NORVASC) 10 MG tablet Take 10 mg by mouth daily.    Marland Kitchen aspirin 81 MG tablet Take 81 mg by mouth daily.    Marland Kitchen atorvastatin (LIPITOR) 20 MG tablet Take 20 mg by mouth daily.    . Cholecalciferol (VITAMIN D3) 3000 UNITS TABS Take 1 tablet by mouth daily.    . Cinnamon 500 MG TABS Take 1,000 mg by mouth daily.    . colesevelam (WELCHOL) 625 MG tablet Take 625 mg by mouth 2 (two) times daily with a meal. Taking 1 packet of powder at night    . docusate sodium (COLACE) 100 MG capsule Take 1 capsule (100 mg total) by mouth 2 (two) times daily. Continue this while taking narcotics to help with bowel movements 30 capsule 1  . fish oil-omega-3 fatty acids 1000 MG capsule Take 2 g by mouth daily.    Marland Kitchen gemfibrozil (LOPID) 600 MG tablet Take 600 mg by mouth 2 (two) times daily before a meal.    . glimepiride (AMARYL) 4 MG tablet Take 4 mg by mouth daily before breakfast.    . insulin aspart (NOVOLOG) 100 UNIT/ML injection Inject 12 Units into the skin daily. Pt uses sliding scale    . insulin detemir (LEVEMIR) 100 UNIT/ML injection Inject 28 Units into the  skin daily before breakfast.     . linagliptin (TRADJENTA) 5 MG TABS tablet Take 5 mg by mouth daily.    Marland Kitchen lisinopril-hydrochlorothiazide (PRINZIDE,ZESTORETIC) 20-12.5 MG per tablet Take 1 tablet by mouth daily.    . Multiple Vitamin (MULTIVITAMIN) tablet Take 1 tablet by mouth daily.    . ondansetron (ZOFRAN) 4 MG tablet Take 1 tablet (4 mg total) by mouth every 8 (eight) hours as needed for nausea. 60 tablet 0  . oxyCODONE (ROXICODONE) 5 MG immediate release tablet Take 2 tablets (10 mg total) by mouth every 4 (four) hours as needed for severe pain. 90 tablet 0  . sertraline (ZOLOFT) 100 MG  tablet Take 150 mg by mouth daily.      No current facility-administered medications on file prior to visit.    Review of Systems:  As per HPI, otherwise negative.    Physical Examination: Filed Vitals:   08/15/14 1823  BP: 138/60  Pulse: 66  Temp: 98 F (36.7 C)  Resp: 18   Filed Vitals:   08/15/14 1823  Height: 5' 6.5" (1.689 m)  Weight: 180 lb (81.647 kg)   Body mass index is 28.62 kg/(m^2). Ideal Body Weight: Weight in (lb) to have BMI = 25: 156.9   GEN: WDWN, NAD, Non-toxic, Alert & Oriented x 3 HEENT: Atraumatic, Normocephalic.  Ears and Nose: No external deformity.  Bilaterally excessive cerumen EXTR: No clubbing/cyanosis/edema NEURO: Normal gait.  PSYCH: Normally interactive. Conversant. Not depressed or anxious appearing.  Calm demeanor.    Assessment and Plan: Cerumen impaction Cleared with irrigation   Signed,  Ellison Carwin, MD

## 2014-08-15 NOTE — Patient Instructions (Signed)
Cerumen Impaction °A cerumen impaction is when the wax in your ear forms a plug. This plug usually causes reduced hearing. Sometimes it also causes an earache or dizziness. Removing a cerumen impaction can be difficult and painful. The wax sticks to the ear canal. The canal is sensitive and bleeds easily. If you try to remove a heavy wax buildup with a cotton tipped swab, you may push it in further. °Irrigation with water, suction, and small ear curettes may be used to clear out the wax. If the impaction is fixed to the skin in the ear canal, ear drops may be needed for a few days to loosen the wax. People who build up a lot of wax frequently can use ear wax removal products available in your local drugstore. °SEEK MEDICAL CARE IF:  °You develop an earache, increased hearing loss, or marked dizziness. °Document Released: 10/24/2004 Document Revised: 12/09/2011 Document Reviewed: 12/14/2009 °Exit[employer]® Patient Information ©2015 [employer]. This information is not intended to replace advice given to you by your health care provider. Make sure you discuss any questions you have with your health care provider. ° °

## 2014-12-14 ENCOUNTER — Ambulatory Visit (INDEPENDENT_AMBULATORY_CARE_PROVIDER_SITE_OTHER): Payer: Managed Care, Other (non HMO) | Admitting: Endocrinology

## 2014-12-14 ENCOUNTER — Encounter: Payer: Self-pay | Admitting: Endocrinology

## 2014-12-14 ENCOUNTER — Telehealth: Payer: Self-pay | Admitting: Endocrinology

## 2014-12-14 VITALS — BP 136/70 | Temp 98.1°F | Ht 68.0 in | Wt 180.4 lb

## 2014-12-14 DIAGNOSIS — E1121 Type 2 diabetes mellitus with diabetic nephropathy: Secondary | ICD-10-CM | POA: Diagnosis not present

## 2014-12-14 DIAGNOSIS — E782 Mixed hyperlipidemia: Secondary | ICD-10-CM | POA: Diagnosis not present

## 2014-12-14 DIAGNOSIS — I10 Essential (primary) hypertension: Secondary | ICD-10-CM | POA: Insufficient documentation

## 2014-12-14 DIAGNOSIS — E1165 Type 2 diabetes mellitus with hyperglycemia: Secondary | ICD-10-CM

## 2014-12-14 DIAGNOSIS — IMO0002 Reserved for concepts with insufficient information to code with codable children: Secondary | ICD-10-CM

## 2014-12-14 LAB — BASIC METABOLIC PANEL
BUN: 29 mg/dL — ABNORMAL HIGH (ref 6–23)
CO2: 24 mEq/L (ref 19–32)
Calcium: 10 mg/dL (ref 8.4–10.5)
Chloride: 107 mEq/L (ref 96–112)
Creatinine, Ser: 1.48 mg/dL (ref 0.40–1.50)
GFR: 50.48 mL/min — ABNORMAL LOW (ref >60.00)
Glucose, Bld: 116 mg/dL — ABNORMAL HIGH (ref 70–99)
Potassium: 4 mEq/L (ref 3.5–5.1)
Sodium: 137 mEq/L (ref 135–145)

## 2014-12-14 LAB — HEMOGLOBIN A1C: Hgb A1c MFr Bld: 7.4 % — ABNORMAL HIGH (ref 4.6–6.5)

## 2014-12-14 NOTE — Progress Notes (Signed)
Pre visit review using our clinic review tool, if applicable. No additional management support is needed unless otherwise documented below in the visit note. 

## 2014-12-14 NOTE — Telephone Encounter (Signed)
See note below and please advise which medication pt is referring to. Thanks!

## 2014-12-14 NOTE — Telephone Encounter (Signed)
He was supposed to switch to [facility] when he finishes his Levemir which he still has.  We can send a prescription for Toujeo 42 units once a day

## 2014-12-14 NOTE — Patient Instructions (Addendum)
Please check blood sugars at least half the time about 2 hours after any meal and 5-6 times per week on waking up. Please bring blood sugar monitor to each visit. Recommended blood sugar levels about 2 hours after meal is 140-180 and on waking up 90-130  LEVEMIR insulin: Start taking 20 units twice a day until finished and then stop  TOUJEO insulin: This insulin provides blood sugar control for  24 hours.  Start this when Levemir is out Start with 40 units at breakfast daily and increase by 2 units every 5 days until the waking up sugars are under 130.  Then continue the same dose.  If blood sugar is under 90 for 2 days in a row, reduce the dose by 2 units. Note that this insulin does not control the rise of blood sugar with meals    NOVOLOG: Take 10 units with each meal but may reduce it to 8 units if eating a light supper or planning to go walking.   If the blood sugars About 2 hours after meals are still over 180 may increase the dose by 2 units  Check the following glucose monitor branch: Accu-Chek, contour, One Touch and Freestyle  If the kidney test is reasonable we will have you start  taking Metformin 500 mg, 1 tablet with your main meal for 5 days. Occasionally this may initially cause loose stools or nausea. If  tolerating well after 5 days add a second Metformin tablet (500 mg) at the same time.  Stop gemfibrozil, glimepiride, Tradgenta and WelChol

## 2014-12-14 NOTE — Telephone Encounter (Signed)
Pt states that Dr. Dwyane Dee said he was going to call in a med for him but he doesn't know what. Please call in to [facility]. Thank you

## 2014-12-14 NOTE — Progress Notes (Signed)
Patient ID: Alec [name], male   DOB: April 05, 1946, 69 y.o.   MRN: [id number]           Reason for Appointment: Consultation for Type 2 Diabetes  Referring physician:Shaw  History of Present Illness:          Diagnosis: Type 2 diabetes mellitus, date of diagnosis: 2001       Past history:  He was not symptomatic at time of diagnosis and was probably treated with Amaryl only.  No details are available of previous management However he thinks he was not given metformin at any time because of his "kidney problem" He had a A1c of over 9% in 2013 and may have been started on insulin at that time. Most likely has been taking Levemir and NovoLog since then A1c records show his results were over 8% in 2014 and in 4/15 but subsequently down to 7.1 in November 2015  Recent history:   He thinks his Levemir insulin has been increased gradually and is now taking 37 units He generally takes NovoLog with breakfast and supper only and has not taken it at lunch for unknown reasons He thinks he is fairly compliant with taking NovoLog before meals He also has been continued on 3 oral hypoglycemic drugs as below for quite some time He is now being referred here because of progressively higher blood sugars over the last month or 2 Currently using a [employer] brand meter and not keeping a record. He thinks his blood sugars are mostly high at breakfast and suppertime but is checking readings mostly in the morning However he thinks his blood sugars are quite variable in the morning       Oral hypoglycemic drugs the patient is taking [id number], Tradgenta, WelChol      Side effects from medications have been:none INSULIN regimen is described as: Levemir 37 qd; Novolog 10 units in am 12 acs  Compliance with the medical regimen:fair  Hypoglycemia: none recently, sometimes after walking   Glucose monitoring:  done one time a day         Glucometer: Relion      Blood Glucose readings by time of day by  recall  PREMEAL Breakfast Lunch Dinner Bedtime  Overall   Glucose range: 116-290  220    Median:         Self-care: The diet that the patient has been following is: tries to limit carbohydrates.     Meals: 3 meals per day. Breakfast isusually an egg sandwich.  Usually eating fast food at lunch and not usually low fat.  Eating a balanced meals in the evening.  He will have snacks on granola bars and yogurt raisins           Exercise: starting to walk in the evenings about the last month, about 1 mile         Dietician visit, most recent:6/15.               Weight history: Wt Readings from Last 3 Encounters:  12/14/14 180 lb 6.4 oz (81.829 kg)  08/15/14 180 lb (81.647 kg)  03/15/14 180 lb 8 oz (81.874 kg)    Glycemic control: last A1c was 7.1 in 11/15   Lab Results  Component Value Date   HGBA1C 9.3* 08/29/2012   Lab Results  Component Value Date   CREATININE 1.02 09/01/2013   CREATININE 1.34 09/01/2013         Medication List       This  list is accurate as of: 12/14/14 10:59 AM.  Always use your most recent med list.               amLODipine 10 MG tablet  Commonly known as:  NORVASC  Take 10 mg by mouth daily.     aspirin 81 MG tablet  Take 81 mg by mouth daily.     atorvastatin 20 MG tablet  Commonly known as:  LIPITOR  Take 20 mg by mouth daily.     Cinnamon 500 MG Tabs  Take 1,000 mg by mouth daily.     colesevelam 625 MG tablet  Commonly known as:  WELCHOL  Take 625 mg by mouth 2 (two) times daily with a meal. Taking 1 packet of powder at night     fish oil-omega-3 fatty acids 1000 MG capsule  Take 2 g by mouth daily.     gemfibrozil 600 MG tablet  Commonly known as:  LOPID  Take 600 mg by mouth 2 (two) times daily before a meal.     glimepiride 4 MG tablet  Commonly known as:  AMARYL  Take 4 mg by mouth daily before breakfast.     insulin aspart 100 UNIT/ML injection  Commonly known as:  novoLOG  10 units qam and 12 qhs     insulin  detemir 100 UNIT/ML injection  Commonly known as:  LEVEMIR  Inject 37 Units into the skin daily before breakfast.     linagliptin 5 MG Tabs tablet  Commonly known as:  TRADJENTA  Take 5 mg by mouth daily.     lisinopril-hydrochlorothiazide 20-12.5 MG per tablet  Commonly known as:  PRINZIDE,ZESTORETIC  Take 1 tablet by mouth daily.     multivitamin tablet  Take 1 tablet by mouth daily.     sertraline 100 MG tablet  Commonly known as:  ZOLOFT  Take 150 mg by mouth daily.     Vitamin D3 3000 UNITS Tabs  Take 1 tablet by mouth daily.        Allergies: No Known Allergies  Past Medical History  Diagnosis Date  . IDDM (insulin dependent diabetes mellitus)   . Hypertension     under control with meds., has been on med. > 10 yr.  . Degenerative arthritis of shoulder region 08/2013    left  . Disorder of bursae and tendons in left shoulder region 08/2013  . Hyperlipidemia     Past Surgical History  Procedure Laterality Date  . Cholecystectomy  08/29/2012    Procedure: LAPAROSCOPIC CHOLECYSTECTOMY WITH INTRAOPERATIVE CHOLANGIOGRAM;  Surgeon: Imogene Burn. Tsuei, MD;  Location: [facility];  Service: General;  Laterality: N/A;  . Transurethral resection of bladder tumor with mitomycin-c  08/24/2008  . Shoulder arthroscopy with subacromial decompression, rotator cuff repair and bicep tendon repair Left 09/03/2013    Procedure: LEFT SHOULDER ARTHROSCOPY WITH EXTENSIVE DEBRIDMENT, DISTAL CLAVICULECTOMY, ROTATOR CUFF REPAIR AND SUBACROMIAL DECOMPRESSION PARTIAL ACRIOMIOPLASTY WITH CORACOACROMIAL RELEASE;  Surgeon: Renette Butters, MD;  Location: [facility];  Service: Orthopedics;  Laterality: Left;    Family History  Problem Relation Age of Onset  . Diabetes Mother   . Cancer Father     Social History:  reports that he quit smoking about 17 years ago. He has never used smokeless tobacco. He reports that he does not drink alcohol or use illicit drugs.    Review of  Systems       Vision is normal. Most recent eye exam was 12/15.  Was  told that he had no retinopathy       Lipids: he is taking a combination of Lipitor, WelChol and gemfibrozil Review of his lipid panels from 2015 showed LDL in the 30-41 range, triglycerides 156, 182 and HDL 26, 25      No results found for: CHOL, HDL, LDLCALC, LDLDIRECT, TRIG, CHOLHDL                Skin: No rash or infections     Thyroid:  No  unusual fatigue.No history of thyroid disease     The blood pressure has been controlled with lisinopril HCT.  He has been followed by nephrologist for diabetic nephropathy.  His last microalbumin/creatinine ratio was 393 and in 4/15 his 24-hour urine protein was 538     No swelling of feet.     No shortness of breath or chest tightness  on exertion. Does not have any known coronary artery disease  No calf pain on walking     Bowel habits: Normal.      No joint  pains.          No history of Numbness, tingling or burning in feet       Physical Examination:  BP 136/70 mmHg  Temp(Src) 98.1 F (36.7 C) (Oral)  Ht 5\' 8"  (1.727 m)  Wt 180 lb 6.4 oz (81.829 kg)  BMI 27.44 kg/m2  GENERAL:         Patient has mild abdominal obesity.   HEENT:         Eye exam shows normal external appearance. Fundus exam shows no retinopathy. Oral exam shows normal mucosa .  NECK:         General:  Neck exam shows no lymphadenopathy. Carotids are normal to palpation and no bruit heard.  Thyroid is not enlarged and no nodules felt.   LUNGS:         Chest is symmetrical. Lungs are clear to auscultation.Marland Kitchen   HEART:         Heart sounds:  S1 and S2 are normal. No murmurs or clicks heard., no S3 or S4.   ABDOMEN:   There is no distention present. Liver and spleen are not palpable. No other mass or tenderness present.  EXTREMITIES:     There is no edema. No skin lesions present.Marland Kitchen  NEUROLOGICAL:   Vibration sense is mildly reduced in toes. Ankle jerks are 1+ bilaterally.            Diabetic  foot exam shows normal monofilament sensation in the toes and plantar surfaces, no skin lesions or ulcers on the feet and normal pedal pulses  MUSCULOSKELETAL:       There is no enlargement or deformity of the joints. Spine is normal to inspection.Marland Kitchen   SKIN:       No rash or lesions of concern.        ASSESSMENT:  Diabetes type 2, uncontrolled    Patient appears to have had higher blood sugars by recall over the last few weeks even though his A1c was 7.1 in 07/2014 Currently on a regimen of Levemir once a day and NovoLog with 2 of his 3 meals Unclear whether he has a reliable monitor and not as his fasting blood sugars range from near-normal to almost 300 Also has fluctuation of his blood sugars at suppertime even though he is not covering his lunch with NovoLog He is only partly compliant with his diet and often getting high fat meals especially when  eating out Recently has started walking for exercise  Discussed with the patient that he is very likely insulin deficient and will need to be on basal bolus insulin regimen Unlikely to be benefiting from Amaryl, [place] and WelChol Also discussed that recent studies indicated that metformin is safe down to a GFR of 35 and this may help his control as well as improvement insulin resistance  Complications:diabetic nephropathy?  He has proteinuria and will need to review records from the nephrologist to establish this diagnosis  HYPERTENSION: Appears well controlled  HYPERLIPIDEMIA: He is taking WelChol and Lipitor and his LDL is only 30.  Also his triglycerides are only minimally increased at this time  PLAN:   Start taking Levemir twice a day to help with fasting blood sugar control more consistently.  He will try 20 units twice a day  Switch Levemir to Toujeo when this is finished and he can start with 40 units.  Discussed how to adjust the dose based on fasting blood sugars every 5 days  Start taking NovoLog at lunchtime  Adjust  NovoLog based on meal size and activity level.  Also needs to target his two-hour reading to 180 or less.  Written instructions given  Check A1c and basic metabolic panel today  He will review his progress with the nurse educator in 1 week and will need to have more comprehensive  diabetes education done   Stop Amaryl, Tradgenta and WelChol as these are unlikely to be helping his diabetes control since he has primarily insulin deficiency  For insulin resistance most likely will start metformin if GFR is still around 50 and go up to 1000 mg a day  Stop GEMFIBROZIL as it has an interaction with Lipitor and may consider fenofibrate if triglycerides stay high even after better diabetes control   Counseling time over 50% of today's 60 minute visit  Patient Instructions  Please check blood sugars at least half the time about 2 hours after any meal and 5-6 times per week on waking up. Please bring blood sugar monitor to each visit. Recommended blood sugar levels about 2 hours after meal is 140-180 and on waking up 90-130  LEVEMIR insulin: Start taking 20 units twice a day until finished and then stop  TOUJEO insulin: This insulin provides blood sugar control for  24 hours.  Start this when Levemir is out Start with 40 units at breakfast daily and increase by 2 units every 5 days until the waking up sugars are under 130.  Then continue the same dose.  If blood sugar is under 90 for 2 days in a row, reduce the dose by 2 units. Note that this insulin does not control the rise of blood sugar with meals    NOVOLOG: Take 10 units with each meal but may reduce it to 8 units if eating a light supper or planning to go walking.   If the blood sugars About 2 hours after meals are still over 180 may increase the dose by 2 units  Check the following glucose monitor branch: Accu-Chek, contour, One Touch and Freestyle  If the kidney test is reasonable we will have you start  taking Metformin 500 mg, 1  tablet with your main meal for 5 days. Occasionally this may initially cause loose stools or nausea. If  tolerating well after 5 days add a second Metformin tablet (500 mg) at the same time.  Stop gemfibrozil, glimepiride, Tradgenta and WelChol    Cori Henningsen 12/14/2014, 10:59 AM  Note: This office note was prepared with Dragon voice recognition system technology. Any transcriptional errors that result from this process are unintentional.  

## 2014-12-15 ENCOUNTER — Other Ambulatory Visit: Payer: Self-pay

## 2014-12-15 ENCOUNTER — Telehealth: Payer: Self-pay | Admitting: Endocrinology

## 2014-12-15 MED ORDER — INSULIN GLARGINE 300 UNIT/ML ~~LOC~~ SOPN: 42.0000 [IU] | PEN_INJECTOR | Freq: Every day | SUBCUTANEOUS | Status: DC

## 2014-12-15 MED ORDER — GLUCOSE BLOOD VI STRP: ORAL_STRIP | Status: DC

## 2014-12-15 MED ORDER — ONETOUCH LANCETS MISC: Status: DC

## 2014-12-15 MED ORDER — ONETOUCH VERIO SYNC SYSTEM W/DEVICE KIT: PACK | Status: DC

## 2014-12-15 NOTE — Progress Notes (Signed)
Quick Note:  Please let patient know that the A1c was 7.4, better but not at target  ______

## 2014-12-15 NOTE — Telephone Encounter (Signed)
Rx sent for Toujeo. 

## 2014-12-16 ENCOUNTER — Telehealth: Payer: Self-pay

## 2014-12-16 ENCOUNTER — Telehealth: Payer: Self-pay | Admitting: Endocrinology

## 2014-12-16 NOTE — Telephone Encounter (Signed)
Medication updated to reflect pt is taking 20 mg of Lisinopril.

## 2014-12-16 NOTE — Telephone Encounter (Signed)
Pt called wanting to clarify if his to be taking just lisinopril. On current medication list it reflects the pt is taking the lisinopril-hydrochlorothiazide 20-12.5 pt states he is not taking this medication. Please advise, Thanks!

## 2014-12-16 NOTE — Telephone Encounter (Signed)
Need to correct

## 2014-12-16 NOTE — Telephone Encounter (Signed)
Can we call in accucheck strips and meter and lancets for the pt per his insurance company this is the only meter it will cover. Thank you!

## 2014-12-19 ENCOUNTER — Other Ambulatory Visit: Payer: Self-pay | Admitting: *Deleted

## 2014-12-19 MED ORDER — ACCU-CHEK AVIVA PLUS W/DEVICE KIT
PACK | Status: DC
Start: 2014-12-19 — End: 2014-12-21

## 2014-12-19 MED ORDER — GLUCOSE BLOOD VI STRP: ORAL_STRIP | Status: DC

## 2014-12-19 MED ORDER — ACCU-CHEK SOFTCLIX LANCETS MISC: Status: DC

## 2014-12-20 NOTE — Telephone Encounter (Signed)
error 

## 2014-12-21 ENCOUNTER — Encounter: Payer: Medicare Other | Attending: Endocrinology | Admitting: Nutrition

## 2014-12-21 ENCOUNTER — Other Ambulatory Visit: Payer: Self-pay | Admitting: *Deleted

## 2014-12-21 DIAGNOSIS — Z713 Dietary counseling and surveillance: Secondary | ICD-10-CM | POA: Diagnosis not present

## 2014-12-21 DIAGNOSIS — E1121 Type 2 diabetes mellitus with diabetic nephropathy: Secondary | ICD-10-CM | POA: Diagnosis not present

## 2014-12-21 DIAGNOSIS — Z794 Long term (current) use of insulin: Secondary | ICD-10-CM | POA: Insufficient documentation

## 2014-12-21 MED ORDER — ONETOUCH VERIO SYNC SYSTEM W/DEVICE KIT: PACK | Status: DC

## 2014-12-27 ENCOUNTER — Telehealth: Payer: Self-pay | Admitting: Nutrition

## 2014-12-27 NOTE — Progress Notes (Deleted)
Subjective:     Patient ID: Alec [name], male   DOB: [birth year], 69 y.o.   MRN: [id number]  HPI  See  Md note   Review of Systems  See md note     Objective:   Physical Exam  Constitutional: He appears well-developed and well-nourished.  Nursing note reviewed.   patient is alert and oriented.       Assessment:   ***    Plan:

## 2014-12-28 NOTE — Patient Instructions (Signed)
Add 1-2 extra units of Novolog when meals contain more carbohydrates in them.   Reduce the amount of Novolog in the meal prior to when you will be more active afterwards.

## 2015-01-04 ENCOUNTER — Encounter: Payer: Self-pay | Admitting: Endocrinology

## 2015-01-04 ENCOUNTER — Ambulatory Visit (INDEPENDENT_AMBULATORY_CARE_PROVIDER_SITE_OTHER): Payer: Medicare Other | Admitting: Endocrinology

## 2015-01-04 VITALS — BP 120/68 | HR 68 | Temp 98.9°F | Wt 180.4 lb

## 2015-01-04 DIAGNOSIS — E1165 Type 2 diabetes mellitus with hyperglycemia: Secondary | ICD-10-CM

## 2015-01-04 DIAGNOSIS — IMO0002 Reserved for concepts with insufficient information to code with codable children: Secondary | ICD-10-CM

## 2015-01-04 NOTE — Progress Notes (Signed)
Patient ID: Alec [name], male   DOB: August 02, 1946, 69 y.o.   MRN: [id number]           Reason for Appointment:  for Type 2 Diabetes  Referring physician:Shaw  History of Present Illness:          Diagnosis: Type 2 diabetes mellitus, date of diagnosis: 2001       Past history:  He was not symptomatic at time of diagnosis and was probably treated with Amaryl only.  No details are available of previous management However he thinks he was not given metformin at any time because of his "kidney problem" He had a A1c of over 9% in 2013 and may have been started on insulin at that time. Most likely has been taking Levemir and NovoLog since then A1c records show his results were over 8% in 2014 and in 4/15 but subsequently down to 7.1 in November 2015  Recent history:    He was seen in consultation in 11/2014 about 3 weeks ago Although at that time his A1c was only 7.4 his blood sugars were fluctuating significantly and overall significantly high especially in the evenings He was switched to Levemir 20 units twice a day and his Amaryl and Senaida Lange was stopped Also he was told to start taking NovoLog before lunch which he was not doing His blood sugars were downloaded today from the One Touch monitor and blood sugar patterns are as follows:  His fasting readings are mildly increased but fairly consistent  Blood sugars are generally lower at lunch and supper and averaging about 120 or less  Has had mild hypoglycemia at lunch and supper on 2 occasions  He has done only a few readings after supper and these symptoms be mostly over 200, sometimes related to large meals       Oral hypoglycemic drugs the patient is taking are: None    Side effects from medications have been:none INSULIN regimen is described as: Levemir 20 twice a day ; Novolog 10 units in am and before lunch and 12 acs  Compliance with the medical regimen: Improving  Hypoglycemia: Mild symptoms with glucose of 65 before  supper on 3/30  Glucose monitoring:  done 3 times a day Glucometer:  One Touch       Blood Glucose readings by time of day  PRE-MEAL Breakfast Lunch Dinner Bedtime Overall  Glucose range:  124-210   67-164   65-218   119-250    median:  151   114   118   220  139    Self-care: The diet that the patient has been following is: tries to limit carbohydrates.     Meals: 3 meals per day. Breakfast is usually an egg sandwich or Pop tart.  Usually eating fast food at lunch and not usually low fat.   Eating a balanced meals in the evening.  He will have snacks on granola bars and yogurt raisins            Exercise: starting to walk in the evenings about the last month, about 1 mile         Dietician visit, most recent:6/15.               Weight history:  Wt Readings from Last 3 Encounters:  01/04/15 180 lb 6.4 oz (81.829 kg)  12/14/14 180 lb 6.4 oz (81.829 kg)  08/15/14 180 lb (81.647 kg)    Glycemic control: last A1c was 7.1 in 11/15  Lab Results  Component Value Date   HGBA1C 7.4* 12/14/2014   HGBA1C 9.3* 08/29/2012   Lab Results  Component Value Date   CREATININE 1.48 12/14/2014         Medication List       This list is accurate as of: 01/04/15  9:15 AM.  Always use your most recent med list.               ACCU-CHEK SOFTCLIX LANCETS lancets  Use as instructed to check blood sugar 5-6 times per day dx code E11.9     amLODipine 10 MG tablet  Commonly known as:  NORVASC  Take 10 mg by mouth daily.     aspirin 81 MG tablet  Take 81 mg by mouth daily.     atorvastatin 20 MG tablet  Commonly known as:  LIPITOR  Take 20 mg by mouth daily.     Cinnamon 500 MG Tabs  Take 1,000 mg by mouth daily.     fish oil-omega-3 fatty acids 1000 MG capsule  Take 2 g by mouth daily.     glimepiride 4 MG tablet  Commonly known as:  AMARYL  Take 4 mg by mouth daily before breakfast.     glucose blood test strip  Commonly known as:  ACCU-CHEK AVIVA PLUS  Use as instructed  to check blood sugar 5-6 times per day dx code E11.9     insulin aspart 100 UNIT/ML injection  Commonly known as:  novoLOG  10 units qam and 12 qhs     Insulin Glargine 300 UNIT/ML Sopn  Commonly known as:  TOUJEO SOLOSTAR  Inject 42 Units into the skin daily.     linagliptin 5 MG Tabs tablet  Commonly known as:  TRADJENTA  Take 5 mg by mouth daily.     lisinopril 20 MG tablet  Commonly known as:  PRINIVIL,ZESTRIL  Take 20 mg by mouth daily.     multivitamin tablet  Take 1 tablet by mouth daily.     [place] W/DEVICE Kit  Use to check blood sugar 2 times per day dx code E11.21     sertraline 100 MG tablet  Commonly known as:  ZOLOFT  Take 150 mg by mouth daily.     Vitamin D3 3000 UNITS Tabs  Take 1 tablet by mouth daily.        Allergies: No Known Allergies  Past Medical History  Diagnosis Date  . IDDM (insulin dependent diabetes mellitus)   . Hypertension     under control with meds., has been on med. > 10 yr.  . Degenerative arthritis of shoulder region 08/2013    left  . Disorder of bursae and tendons in left shoulder region 08/2013  . Hyperlipidemia     Past Surgical History  Procedure Laterality Date  . Cholecystectomy  08/29/2012    Procedure: LAPAROSCOPIC CHOLECYSTECTOMY WITH INTRAOPERATIVE CHOLANGIOGRAM;  Surgeon: Imogene Burn. Tsuei, MD;  Location: [facility];  Service: General;  Laterality: N/A;  . Transurethral resection of bladder tumor with mitomycin-c  08/24/2008  . Shoulder arthroscopy with subacromial decompression, rotator cuff repair and bicep tendon repair Left 09/03/2013    Procedure: LEFT SHOULDER ARTHROSCOPY WITH EXTENSIVE DEBRIDMENT, DISTAL CLAVICULECTOMY, ROTATOR CUFF REPAIR AND SUBACROMIAL DECOMPRESSION PARTIAL ACRIOMIOPLASTY WITH CORACOACROMIAL RELEASE;  Surgeon: Renette Butters, MD;  Location: [place];  Service: Orthopedics;  Laterality: Left;    Family History  Problem Relation Age of Onset  . Diabetes  Mother   .  Cancer Father     Social History:  reports that he quit smoking about 17 years ago. He has never used smokeless tobacco. He reports that he does not drink alcohol or use illicit drugs.    Review of Systems   Most recent eye exam was 12/15.  Was told that he had no retinopathy       Lipids: he is taking a combination of Lipitor, WelChol and gemfibrozil Review of his lipid panels from 2015 showed LDL in the 30-41 range, triglycerides 156, 182 and HDL 26, 25      No results found for: CHOL, HDL, LDLCALC, LDLDIRECT, TRIG, CHOLHDL                  The blood pressure has been controlled with lisinopril HCT.  He has been followed by nephrologist for diabetic nephropathy.  His last microalbumin/creatinine ratio was 393 and in 4/15 his 24-hour urine protein was 538  Lab Results  Component Value Date   CREATININE 1.48 12/14/2014   BUN 29* 12/14/2014   NA 137 12/14/2014   K 4.0 12/14/2014   CL 107 12/14/2014   CO2 24 12/14/2014      Physical Examination:  BP 120/68 mmHg  Pulse 68  Temp(Src) 98.9 F (37.2 C) (Oral)  Wt 180 lb 6.4 oz (81.829 kg)        ASSESSMENT:  Diabetes type 2, nonobese Currently on a regimen of Levemir twice a day since his last visit and has also been taking NovoLog before every meal His blood sugars are analyzed by monitor download today and discussed in detail in history of present illness He has had an improvement in his blood sugars overall and these are fluctuating less He was having high readings previously at suppertime and bedtime because of inadequate insulin at meals and this is improving However blood sugars are still high after supper with his dose of 12 units of NovoLog He seems to be benefiting from taking LEVEMIR twice a day  Blood sugars are not obviously affected by his stopping Amaryl and Tradjenta as well as WelChol However his Levemir is very expensive and he can do better with taking Toujeo once a day and this would be  better covered by his insurance  HYPERTENSION: Appears well controlled  HYPERLIPIDEMIA: He is taking  Lipitor and he will be following up with his PCP Gemfibrozil  was stopped because of interaction with Lipitor Since his glucose levels are better he may not have persistently high triglycerides, may need fenofibrate if these are still high  PLAN:   Switch Levemir to Toujeo and he can start with 42 units in the evenings and he can take it at suppertime for convenience.    Discussed how to adjust the dose of fasting blood sugars based on fasting blood sugars every 3-5 days.  Given him a flow sheet to do the titration.  He will increase the dose by 2 units if blood sugars are over 130 and reduce it by 4 units if below 90  Reduce NovoLog at breakfast and lunch by 2 units unless eating more carbohydrates  Increase NovoLog at suppertime to 14 units especially if eating larger meal  Discussed postprandial blood sugar targets  Follow-up will review his blood sugar patterns in one month   Counseling time over 50% of today's 25 minute visit  Patient Instructions  Please check blood sugars at least half the time about 2-3 hours after any meal and 3 times per week on  waking up.  Please bring blood sugar monitor to each visit. Recommended blood sugar levels about 2 hours after meal is 140-180 and on waking up 90-130  Toujeo 42 unit in pms   Have a protein in am like low fat meat or cheese  Novolog 8 at Bfst and lunch and 12-14 at supper     [facility] 01/04/2015, 9:15 AM   Note: This office note was prepared with Estate agent. Any transcriptional errors that result from this process are unintentional.

## 2015-01-04 NOTE — Progress Notes (Signed)
Pre visit review using our clinic review tool, if applicable. No additional management support is needed unless otherwise documented below in the visit note. 

## 2015-01-04 NOTE — Patient Instructions (Signed)
Please check blood sugars at least half the time about 2-3 hours after any meal and 3 times per week on waking up.  Please bring blood sugar monitor to each visit. Recommended blood sugar levels about 2 hours after meal is 140-180 and on waking up 90-130  Toujeo 42 unit in pms   Have a protein in am like low fat meat or cheese  Novolog 8 at Bfst and lunch and 12-14 at supper

## 2015-01-11 NOTE — Telephone Encounter (Signed)
Opened in error

## 2015-01-18 DIAGNOSIS — N2581 Secondary hyperparathyroidism of renal origin: Secondary | ICD-10-CM | POA: Diagnosis not present

## 2015-01-18 DIAGNOSIS — I129 Hypertensive chronic kidney disease with stage 1 through stage 4 chronic kidney disease, or unspecified chronic kidney disease: Secondary | ICD-10-CM | POA: Diagnosis not present

## 2015-01-18 DIAGNOSIS — E782 Mixed hyperlipidemia: Secondary | ICD-10-CM | POA: Diagnosis not present

## 2015-01-18 DIAGNOSIS — Z0000 Encounter for general adult medical examination without abnormal findings: Secondary | ICD-10-CM | POA: Diagnosis not present

## 2015-01-18 DIAGNOSIS — Z125 Encounter for screening for malignant neoplasm of prostate: Secondary | ICD-10-CM | POA: Diagnosis not present

## 2015-01-18 DIAGNOSIS — M150 Primary generalized (osteo)arthritis: Secondary | ICD-10-CM | POA: Diagnosis not present

## 2015-01-18 DIAGNOSIS — M109 Gout, unspecified: Secondary | ICD-10-CM | POA: Diagnosis not present

## 2015-01-18 DIAGNOSIS — F411 Generalized anxiety disorder: Secondary | ICD-10-CM | POA: Diagnosis not present

## 2015-01-18 DIAGNOSIS — N183 Chronic kidney disease, stage 3 (moderate): Secondary | ICD-10-CM | POA: Diagnosis not present

## 2015-01-18 DIAGNOSIS — E1122 Type 2 diabetes mellitus with diabetic chronic kidney disease: Secondary | ICD-10-CM | POA: Diagnosis not present

## 2015-02-03 ENCOUNTER — Encounter: Payer: Self-pay | Admitting: Endocrinology

## 2015-02-03 ENCOUNTER — Ambulatory Visit (INDEPENDENT_AMBULATORY_CARE_PROVIDER_SITE_OTHER): Payer: Managed Care, Other (non HMO) | Admitting: Endocrinology

## 2015-02-03 VITALS — BP 128/64 | HR 61 | Temp 98.2°F | Resp 14 | Ht 68.0 in | Wt 182.8 lb

## 2015-02-03 DIAGNOSIS — E1121 Type 2 diabetes mellitus with diabetic nephropathy: Secondary | ICD-10-CM

## 2015-02-03 DIAGNOSIS — E782 Mixed hyperlipidemia: Secondary | ICD-10-CM

## 2015-02-03 DIAGNOSIS — E1165 Type 2 diabetes mellitus with hyperglycemia: Secondary | ICD-10-CM

## 2015-02-03 DIAGNOSIS — IMO0002 Reserved for concepts with insufficient information to code with codable children: Secondary | ICD-10-CM

## 2015-02-03 NOTE — Progress Notes (Signed)
Patient ID: Alec [name], male   DOB: 07-05-1946, 69 y.o.   MRN: [id number]           Reason for Appointment:  for Type 2 Diabetes  Referring physician:Shaw  History of Present Illness:          Diagnosis: Type 2 diabetes mellitus, date of diagnosis: 2001       Past history:  He was not symptomatic at time of diagnosis and was probably treated with Amaryl only.  No details are available of previous management However he thinks he was not given metformin at any time because of his "kidney problem" He had a A1c of over 9% in 2013 and may have been started on insulin at that time. Most likely has been taking Levemir and NovoLog since then A1c records show his results were over 8% in 2014 and in 4/15 but subsequently down to 7.1 in November 2015  Recent history:   He was seen in consultation in 11/2014 when his A1c was 7.4 but he was having fluctuating blood sugars with high readings in the evenings He was switched to Levemir 20 units twice a day and more recently has been on Toujeo instead of Levemir in the morning which is less expensive Also he was told to adjust his NovoLog based on meal size and take 14 units at supper instead of 12 However he is still taking 12 units at supper time  His blood sugars were downloaded today from the One Touch monitor and blood sugar patterns are as follows:  His fasting readings are relatively higher now and averaging about 160  Blood sugars are generally lower at lunch and supper but periodically are significantly high at lunchtime possibly because of eating differently on weekends and being less active on some days  Has had no recent hypoglycemia  Has only 2 readings after supper and one reading was high.  He is not adjusting his NovoLog based on meal size in the evening  Has not had any significant weight change  His overall average blood sugar is about 150 now and A1c needs to be done on follow-up  His diet is fairly good usually but  occasionally higher fat, highest blood sugar was 270 fasting after eating a lot of sweets at a birthday party the night before  Oral hypoglycemic drugs the patient is taking are: None    Side effects from medications have been:none INSULIN regimen is described as: Toujeo 44 units in a.m.; Novolog 10 units in am and 12 before lunch and acs  Compliance with the medical regimen: Improving  Hypoglycemia: None recently  Glucose monitoring:  done 3 times a day Glucometer:  One Touch       Blood Glucose readings by time of day between 4/23 and 5/6  PRE-MEAL Breakfast Lunch Dinner Bedtime Overall  Glucose range:  113-270   87-220   100-131  122, 212    median:  160    210   115  144     Self-care: The diet that the patient has been following is: tries to limit carbohydrates.     Meals: 3 meals per day. Breakfast is usually an egg sandwich  Usually eating fast food at lunch and not usually low fat.   Eating a balanced meals in the evening.  He will have snacks on granola bars and yogurt raisins            Exercise: starting to walk in the evenings about the last  month, about 1 mile         Dietician visit, most recent:6/15.               Weight history:  Wt Readings from Last 3 Encounters:  02/03/15 182 lb 12.8 oz (82.918 kg)  01/04/15 180 lb 6.4 oz (81.829 kg)  12/14/14 180 lb 6.4 oz (81.829 kg)    Glycemic control: last A1c was 7.1 in 11/15   Lab Results  Component Value Date   HGBA1C 7.4* 12/14/2014   HGBA1C 9.3* 08/29/2012   Lab Results  Component Value Date   CREATININE 1.48 12/14/2014         Medication List       This list is accurate as of: 02/03/15 10:07 AM.  Always use your most recent med list.               ACCU-CHEK SOFTCLIX LANCETS lancets  Use as instructed to check blood sugar 5-6 times per day dx code E11.9     amLODipine 10 MG tablet  Commonly known as:  NORVASC  Take 10 mg by mouth daily.     aspirin 81 MG tablet  Take 81 mg by mouth daily.       atorvastatin 20 MG tablet  Commonly known as:  LIPITOR  Take 20 mg by mouth daily.     Cinnamon 500 MG Tabs  Take 1,000 mg by mouth daily.     fish oil-omega-3 fatty acids 1000 MG capsule  Take 2 g by mouth daily.     glucose blood test strip  Commonly known as:  ACCU-CHEK AVIVA PLUS  Use as instructed to check blood sugar 5-6 times per day dx code E11.9     insulin aspart 100 UNIT/ML injection  Commonly known as:  novoLOG  10 units qam and 12 qhs     Insulin Glargine 300 UNIT/ML Sopn  Commonly known as:  TOUJEO SOLOSTAR  Inject 42 Units into the skin daily.     linagliptin 5 MG Tabs tablet  Commonly known as:  TRADJENTA  Take 5 mg by mouth daily.     lisinopril 20 MG tablet  Commonly known as:  PRINIVIL,ZESTRIL  Take 20 mg by mouth daily.     lisinopril-hydrochlorothiazide 20-12.5 MG per tablet  Commonly known as:  PRINZIDE,ZESTORETIC     multivitamin tablet  Take 1 tablet by mouth daily.     [place] W/DEVICE Kit  Use to check blood sugar 2 times per day dx code E11.21     sertraline 100 MG tablet  Commonly known as:  ZOLOFT  Take 150 mg by mouth daily.     Vitamin D3 3000 UNITS Tabs  Take 1 tablet by mouth daily.        Allergies: No Known Allergies  Past Medical History  Diagnosis Date  . IDDM (insulin dependent diabetes mellitus)   . Hypertension     under control with meds., has been on med. > 10 yr.  . Degenerative arthritis of shoulder region 08/2013    left  . Disorder of bursae and tendons in left shoulder region 08/2013  . Hyperlipidemia     Past Surgical History  Procedure Laterality Date  . Cholecystectomy  08/29/2012    Procedure: LAPAROSCOPIC CHOLECYSTECTOMY WITH INTRAOPERATIVE CHOLANGIOGRAM;  Surgeon: Imogene Burn. Tsuei, MD;  Location: [facility];  Service: General;  Laterality: N/A;  . Transurethral resection of bladder tumor with mitomycin-c  08/24/2008  . Shoulder arthroscopy with subacromial  decompression, rotator  cuff repair and bicep tendon repair Left 09/03/2013    Procedure: LEFT SHOULDER ARTHROSCOPY WITH EXTENSIVE DEBRIDMENT, DISTAL CLAVICULECTOMY, ROTATOR CUFF REPAIR AND SUBACROMIAL DECOMPRESSION PARTIAL ACRIOMIOPLASTY WITH CORACOACROMIAL RELEASE;  Surgeon: Renette Butters, MD;  Location: [place];  Service: Orthopedics;  Laterality: Left;    Family History  Problem Relation Age of Onset  . Diabetes Mother   . Cancer Father     Social History:  reports that he quit smoking about 17 years ago. He has never used smokeless tobacco. He reports that he does not drink alcohol or use illicit drugs.    Review of Systems   Most recent eye exam was 12/15.  Was told that he had no retinopathy       Lipids: he is taking a combination of Lipitor, WelChol and gemfibrozil Review of his lipid panels from 2015 showed LDL in the 30-41 range, triglycerides 156, 182 and HDL 26, 25      No results found for: CHOL, HDL, LDLCALC, LDLDIRECT, TRIG, CHOLHDL                 The blood pressure has been controlled with lisinopril HCT.  He has been followed by nephrologist for diabetic nephropathy.  His last microalbumin/creatinine ratio was 393 and in 4/15 his 24-hour urine protein was 538  Lab Results  Component Value Date   CREATININE 1.48 12/14/2014   BUN 29* 12/14/2014   NA 137 12/14/2014   K 4.0 12/14/2014   CL 107 12/14/2014   CO2 24 12/14/2014      Physical Examination:  BP 128/64 mmHg  Pulse 61  Temp(Src) 98.2 F (36.8 C)  Resp 14  Ht $R'5\' 8"'pX$  (1.727 m)  Wt 182 lb 12.8 oz (82.918 kg)  BMI 27.80 kg/m2  SpO2 96%        ASSESSMENT:  Diabetes type 2, nonobese  See history of present illness for detailed discussion of his current management, blood sugar patterns and problems identified Currently on a regimen of Toujeo in the morning his blood sugars appear to be high fasting indicating inadequate duration of action or Dawn phenomenon He does not checking enough readings  after meals especially supper Recently doing fairly well with basal bolus insulin regimen but will need follow-up A1c to assess overall control He needs some more education regarding adjustment of mealtime doses based on what he is eating and also however his postprandial readings are He does have some fluctuation in his blood sugars based on his diet  HYPERTENSION:  well controlled  HYPERLIPIDEMIA: He is taking  Lipitor and he will be following up with his PCP Will need fasting triglycerides to be checked also, may have adequate control on Lipitor alone and recently fenofibrate is not indicated in combination with statin drugs  RENAL dysfunction/nephropathy: Followed by nephrologist  PLAN:   Switch Toujeo to the late evening and he can start with 44 units, may need to increase the dose further if fasting readings continue to stay high, target to be at least under 140  He will need to start checking blood sugars more consistently after supper where he probably has high readings, discussed postprandial targets again  Reduce NovoLog at breakfast and lunch by 2 units if planning to be very active  Otherwise he probably needs to go up to 12 units of NovoLog on the weekends when he is eating differently  Increase NovoLog at suppertime to 14 units at least especially if eating larger meal  Discussed postprandial blood sugar targets  Follow-up will review his blood sugar patterns in 2 months with A1c   Counseling time on subjects discussed above is over 50% of today's 25 minute visit   Patient Instructions  Take Toujeo in pm and go up on dose if am sugar > 140  (New insulin is [place] U-200)  More sugars after dinner and may need to up Novolog  Take upto 12 Novolog at [place]; and may need 14 at supper  Please check blood sugars at least half the time about 2 hours after any meal and 3 times per week on waking up. Please bring blood sugar monitor to each visit. Recommended blood  sugar levels about 2 hours after meal is 140-180 and on waking up 90-130     Jep Dyas 02/03/2015, 10:07 AM   Note: This office note was prepared with Estate agent. Any transcriptional errors that result from this process are unintentional.

## 2015-02-03 NOTE — Patient Instructions (Addendum)
Take Toujeo in pm and go up on dose if am sugar > 140  (New insulin is [place] U-200)  More sugars after dinner and may need to up Novolog  Take upto 12 Novolog at [place]; and may need 14 at supper  Please check blood sugars at least half the time about 2 hours after any meal and 3 times per week on waking up. Please bring blood sugar monitor to each visit. Recommended blood sugar levels about 2 hours after meal is 140-180 and on waking up 90-130

## 2015-02-28 ENCOUNTER — Other Ambulatory Visit: Payer: Self-pay | Admitting: *Deleted

## 2015-02-28 ENCOUNTER — Telehealth: Payer: Self-pay | Admitting: Endocrinology

## 2015-02-28 MED ORDER — INSULIN ASPART 100 UNIT/ML ~~LOC~~ SOLN: SUBCUTANEOUS | Status: DC

## 2015-02-28 MED ORDER — LINAGLIPTIN 5 MG PO TABS: 5.0000 mg | ORAL_TABLET | Freq: Every day | ORAL | Status: DC

## 2015-02-28 NOTE — Telephone Encounter (Signed)
Patient will need a refill on his medications   Rx: Novolog  Trudjenta   Thank you

## 2015-03-24 ENCOUNTER — Other Ambulatory Visit (INDEPENDENT_AMBULATORY_CARE_PROVIDER_SITE_OTHER): Payer: Managed Care, Other (non HMO)

## 2015-03-24 DIAGNOSIS — E1165 Type 2 diabetes mellitus with hyperglycemia: Secondary | ICD-10-CM

## 2015-03-24 DIAGNOSIS — IMO0002 Reserved for concepts with insufficient information to code with codable children: Secondary | ICD-10-CM

## 2015-03-24 LAB — BASIC METABOLIC PANEL
BUN: 23 mg/dL (ref 6–23)
CO2: 28 mEq/L (ref 19–32)
Calcium: 9.9 mg/dL (ref 8.4–10.5)
Chloride: 103 mEq/L (ref 96–112)
Creatinine, Ser: 1.7 mg/dL — ABNORMAL HIGH (ref 0.40–1.50)
GFR: 42.98 mL/min — ABNORMAL LOW (ref >60.00)
Glucose, Bld: 73 mg/dL (ref 70–99)
Potassium: 4.4 mEq/L (ref 3.5–5.1)
Sodium: 138 mEq/L (ref 135–145)

## 2015-03-24 LAB — LIPID PANEL
Cholesterol: 123 mg/dL (ref 0–200)
HDL: 32.9 mg/dL — ABNORMAL LOW (ref >39.00)
LDL Cholesterol: 65 mg/dL (ref 0–99)
NonHDL: 90.1
Total CHOL/HDL Ratio: 4
Triglycerides: 126 mg/dL (ref 0.0–149.0)
VLDL: 25.2 mg/dL (ref 0.0–40.0)

## 2015-03-27 ENCOUNTER — Other Ambulatory Visit: Payer: Self-pay

## 2015-03-28 ENCOUNTER — Other Ambulatory Visit: Payer: Self-pay

## 2015-03-28 ENCOUNTER — Other Ambulatory Visit (INDEPENDENT_AMBULATORY_CARE_PROVIDER_SITE_OTHER): Payer: Medicare Other

## 2015-03-28 DIAGNOSIS — E1121 Type 2 diabetes mellitus with diabetic nephropathy: Secondary | ICD-10-CM

## 2015-03-28 NOTE — Progress Notes (Signed)
patient did not bring his meter.  Says FBS today was 94, and today before lunch it was 128, and 128 2hr. After lunch today.   He is currently takig 10u of Novolog before all meals.  He denies low blood sugars.   We discussed the need to adjust the Novgolog dose for varying amounts of carbohydrates eaten, for meals like pasta, and lots of rice.  He reported good understanding of this, and said he will add 1-2 extra units of Novolog when having a desert, or larger amounts of carbohydrates in his supper meal.   He had no final questions

## 2015-03-29 ENCOUNTER — Encounter: Payer: Self-pay | Admitting: Endocrinology

## 2015-03-29 ENCOUNTER — Ambulatory Visit (INDEPENDENT_AMBULATORY_CARE_PROVIDER_SITE_OTHER): Payer: Managed Care, Other (non HMO) | Admitting: Endocrinology

## 2015-03-29 VITALS — BP 154/78 | HR 64 | Temp 98.1°F | Resp 15 | Ht 67.0 in | Wt 185.0 lb

## 2015-03-29 DIAGNOSIS — N289 Disorder of kidney and ureter, unspecified: Secondary | ICD-10-CM | POA: Diagnosis not present

## 2015-03-29 DIAGNOSIS — E1165 Type 2 diabetes mellitus with hyperglycemia: Secondary | ICD-10-CM

## 2015-03-29 DIAGNOSIS — E782 Mixed hyperlipidemia: Secondary | ICD-10-CM

## 2015-03-29 DIAGNOSIS — IMO0002 Reserved for concepts with insufficient information to code with codable children: Secondary | ICD-10-CM

## 2015-03-29 LAB — HEMOGLOBIN A1C: Hgb A1c MFr Bld: 7.3 % — ABNORMAL HIGH (ref 4.6–6.5)

## 2015-03-29 MED ORDER — VICTOZA 18 MG/3ML ~~LOC~~ SOPN: 1.2000 mg | PEN_INJECTOR | Freq: Every day | SUBCUTANEOUS | Status: DC

## 2015-03-29 NOTE — Progress Notes (Signed)
Patient ID: Alec [name], male   DOB: 1946-01-07, 69 y.o.   MRN: [id number]           Reason for Appointment:  for Type 2 Diabetes  Referring physician:Shaw  History of Present Illness:          Diagnosis: Type 2 diabetes mellitus, date of diagnosis: 2001       Past history:  He was not symptomatic at time of diagnosis and was probably treated with Amaryl only.  No details are available of previous management However he thinks he was not given metformin at any time because of his "kidney problem" He had a A1c of over 9% in 2013 and may have been started on insulin at that time. Most likely has been taking Levemir and NovoLog since then A1c records show his results were over 8% in 2014 and in 4/15 but subsequently down to 7.1 in November 2015  Recent history:  INSULIN regimen is described as: Toujeo 46 units hs Novolog 15 units     He was seen in consultation in 11/2014 when his A1c was 7.4 but he was having fluctuating blood sugars with high readings in the evenings He was switched to Levemir 20 units twice a day and subsequently has been on Toujeo instead of Levemir once a day at bedtime He is unable to take metformin because of renal dysfunction Although his Amaryl was stopped in March with his basal bolus regimen he is still taking his Tradjenta even though he was told to stop this  His blood sugars were downloaded today from the One Touch monitor and blood sugar patterns are as follows:  His fasting readings are much higher compared to his last visit even though he is checking the Toujeo in the evening and has gone up to 46 units  There are averaging over 200 now  He has not done many readings in the afternoon and evenings but they do not appear to be higher done in the morning  Blood sugars are relatively high after supper also but they are about the same overnight  He says he has difficulty controlling portions and is also starting to gain a little weight  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  He is still concerned about the cost of his insulin and medications and is now in the donut hole  He has been instructed by the dietitian in meal planning but sometimes eats a lot of carbohydrates including last night  Oral hypoglycemic drugs the patient is taking are: Tradgenta    Side effects from medications have been:none  Compliance with the medical regimen: Improving  Hypoglycemia: None recently  Glucose monitoring:  done 2-3 times a day Glucometer:  One Touch       Blood Glucose readings by time of day   Mean values apply above for all meters except median for One Touch  PRE-MEAL Fasting Lunch Dinner Bedtime Overall  Glucose range:  173-255   197   131-220   148-223    Mean/median:  215      197    Self-care: The diet that the patient has been following is: Relatively low fat    Meals: 3 meals per day. Breakfast is usually an egg sandwich  Usually eating fast food at lunch and not usually low fat.   Eating a balanced meals in the evening.  He will have snacks on granola bars and yogurt raisins  Exercise:  walk sometimes in the evenings , about 1 mile         Dietician visit, most recent:6/15.               Weight history:  Wt Readings from Last 3 Encounters:  03/29/15 185 lb (83.915 kg)  02/03/15 182 lb 12.8 oz (82.918 kg)  01/04/15 180 lb 6.4 oz (81.829 kg)    Glycemic control: last A1c was 7.1 in 11/15   Lab Results  Component Value Date   HGBA1C 7.4* 12/14/2014   HGBA1C 9.3* 08/29/2012   Lab Results  Component Value Date   LDLCALC 65 03/24/2015   CREATININE 1.70* 03/24/2015         Medication List       This list is accurate as of: 03/29/15  8:09 AM.  Always use your most recent med list.               ACCU-CHEK SOFTCLIX LANCETS lancets  Use as instructed to check blood sugar 5-6 times per day dx code E11.9     amLODipine 10 MG tablet  Commonly known as:   NORVASC  Take 10 mg by mouth daily.     aspirin 81 MG tablet  Take 81 mg by mouth daily.     atorvastatin 20 MG tablet  Commonly known as:  LIPITOR  Take 20 mg by mouth daily.     Cinnamon 500 MG Tabs  Take 1,000 mg by mouth daily.     fish oil-omega-3 fatty acids 1000 MG capsule  Take 2 g by mouth daily.     glucose blood test strip  Commonly known as:  ACCU-CHEK AVIVA PLUS  Use as instructed to check blood sugar 5-6 times per day dx code E11.9     insulin aspart 100 UNIT/ML injection  Commonly known as:  novoLOG  10 units qam and 12 qhs     Insulin Glargine 300 UNIT/ML Sopn  Commonly known as:  TOUJEO SOLOSTAR  Inject 42 Units into the skin daily.     linagliptin 5 MG Tabs tablet  Commonly known as:  TRADJENTA  Take 1 tablet (5 mg total) by mouth daily.     lisinopril 20 MG tablet  Commonly known as:  PRINIVIL,ZESTRIL  Take 20 mg by mouth daily.     lisinopril-hydrochlorothiazide 20-12.5 MG per tablet  Commonly known as:  PRINZIDE,ZESTORETIC     multivitamin tablet  Take 1 tablet by mouth daily.     [place] W/DEVICE Kit  Use to check blood sugar 2 times per day dx code E11.21     sertraline 100 MG tablet  Commonly known as:  ZOLOFT  Take 150 mg by mouth daily.     Vitamin D3 3000 UNITS Tabs  Take 1 tablet by mouth daily.        Allergies: No Known Allergies  Past Medical History  Diagnosis Date  . IDDM (insulin dependent diabetes mellitus)   . Hypertension     under control with meds., has been on med. > 10 yr.  . Degenerative arthritis of shoulder region 08/2013    left  . Disorder of bursae and tendons in left shoulder region 08/2013  . Hyperlipidemia     Past Surgical History  Procedure Laterality Date  . Cholecystectomy  08/29/2012    Procedure: LAPAROSCOPIC CHOLECYSTECTOMY WITH INTRAOPERATIVE CHOLANGIOGRAM;  Surgeon: Imogene Burn. Georgette Dover, MD;  Location: [facility];  Service: General;  Laterality: N/A;  . Transurethral resection  of bladder tumor with mitomycin-c  08/24/2008  . Shoulder arthroscopy with subacromial decompression, rotator cuff repair and bicep tendon repair Left 09/03/2013    Procedure: LEFT SHOULDER ARTHROSCOPY WITH EXTENSIVE DEBRIDMENT, DISTAL CLAVICULECTOMY, ROTATOR CUFF REPAIR AND SUBACROMIAL DECOMPRESSION PARTIAL ACRIOMIOPLASTY WITH CORACOACROMIAL RELEASE;  Surgeon: Renette Butters, MD;  Location: [place];  Service: Orthopedics;  Laterality: Left;    Family History  Problem Relation Age of Onset  . Diabetes Mother   . Cancer Father     Social History:  reports that he quit smoking about 17 years ago. He has never used smokeless tobacco. He reports that he does not drink alcohol or use illicit drugs.    Review of Systems   Most recent eye exam was 12/15.  Was told that he had no retinopathy       Lipids: he is taking Lipitor 20 mg, last triglycerides and LDL are normal       Lab Results  Component Value Date   CHOL 123 03/24/2015   HDL 32.90* 03/24/2015   LDLCALC 65 03/24/2015   TRIG 126.0 03/24/2015   CHOLHDL 4 03/24/2015                   The blood pressure has been treated with lisinopril HCT and also lisinopril.  He has been followed by nephrologist for diabetic nephropathy.   His last microalbumin/creatinine ratio was 393 and in 4/15 his 24-hour urine protein was 538 His creatinine is relatively higher than before, not clear why  Lab Results  Component Value Date   CREATININE 1.70* 03/24/2015   BUN 23 03/24/2015   NA 138 03/24/2015   K 4.4 03/24/2015   CL 103 03/24/2015   CO2 28 03/24/2015      Physical Examination:  BP 154/78 mmHg  Pulse 64  Temp(Src) 98.1 F (36.7 C) (Oral)  Resp 15  Ht _0  (1.702 m)  Wt 185 lb (83.915 kg)  BMI 28.97 kg/m2  SpO2 97%        ASSESSMENT:  Diabetes type 2, nonobese  See history of present illness for detailed discussion of his current management, blood sugar patterns and problems identified Currently  on a regimen of Toujeo at night but switching the timing has not helped his sugars and for some reason his blood sugars are much higher than before Also has increased his mealtime coverage without any benefit He does not think he has change his diet although he can do better with portions and carbohydrate control  Also can do more exercise regularly Has gained a little weight also  He is a good candidate for medication like Victoza in addition to increasing his insulin  Discussed with the patient the nature of GLP-1 drugs, the actions on various organ systems, how they benefit blood glucose control, as well as the benefit of weight loss and  increase satiety . Explained possible side effects especially nausea and vomiting initially; discussed safety information in package insert.  Described the injection technique and dosage titration of Victoza  starting with 0.6 mg once a day at the same time for the first week and then increasing to 1.2 mg if no symptoms of nausea.  Educational brochure on Victoza and co-pay card given  He does have some fluctuation in his blood sugars based on his diet and needs to check his blood sugars after supper more often  HYPERTENSION:  Blood pressure is higher, will have him follow up with nephrologist  HYPERLIPIDEMIA: He  is taking  Lipitor and his levels are adequate  RENAL dysfunction/nephropathy: His creatinine is much higher than previous ones of about 1.3-1.4 and will defer to nephrologist.  Not clear why he is taking both lisinopril and lisinopril HCT   PLAN:   Switch Tradjenta tablets to Victoza as discussed above  Increase all  insulin doses especially Toujeo to 54 units and discussed how to titrate this further  Increase NovoLog at suppertime to 18 units at least especially if eating larger meal  Discussed postprandial blood sugar targets  Follow-up will review his blood sugar patterns in 3 weeks  He will be signed up for the indigent program for  the insulin and Victoza products  More consistent diet and exercise   Counseling time on subjects discussed above is over 50% of today's 25 minute visit   There are no Patient Instructions on file for this visit.  Pavan Bring 03/29/2015, 8:09 AM   Note: This office note was prepared with Dragon voice recognition system technology. Any transcriptional errors that result from this process are unintentional.

## 2015-03-29 NOTE — Patient Instructions (Signed)
Toujeo 54 units and go up more every 3 days by 2 units to get am sugar <130  Novolog 18 at supper and keep night reading <160; may need to reduce when portions small   Check blood sugars on waking up .Marland Kitchen5-6  .Marland Kitchen times a week Also check blood sugars about 2 hours after a meal and do this after different meals by rotation Recommended blood sugar levels on waking up is 90-130 and about 2 hours after meal is 140-180 Please bring blood sugar monitor to each visit.  Start Glimeperide at supper  Stop [facility] injection as shown once daily at the same time of the day.  Dial the dose to 0.6 mg on the pen for the first week.  You may inject in the stomach, thigh or arm. You may experience nausea in the first few days which usually goes away.  You will feel fullness of the stomach with starting the medication and should try to keep the portions at meals small. After 1 week increase the dose to 1.2mg  daily if no nausea present.   If any questions or concerns are present call the office or the [facility] helpline at [phone]. Visit [email or web address].info/ for more useful information

## 2015-04-04 ENCOUNTER — Other Ambulatory Visit: Payer: Self-pay | Admitting: Endocrinology

## 2015-04-27 ENCOUNTER — Ambulatory Visit (INDEPENDENT_AMBULATORY_CARE_PROVIDER_SITE_OTHER): Payer: Managed Care, Other (non HMO) | Admitting: Endocrinology

## 2015-04-27 ENCOUNTER — Encounter: Payer: Self-pay | Admitting: Endocrinology

## 2015-04-27 VITALS — BP 136/80 | HR 66 | Temp 98.2°F | Resp 14 | Ht 67.0 in | Wt 190.0 lb

## 2015-04-27 DIAGNOSIS — IMO0002 Reserved for concepts with insufficient information to code with codable children: Secondary | ICD-10-CM

## 2015-04-27 DIAGNOSIS — N289 Disorder of kidney and ureter, unspecified: Secondary | ICD-10-CM | POA: Diagnosis not present

## 2015-04-27 DIAGNOSIS — E1165 Type 2 diabetes mellitus with hyperglycemia: Secondary | ICD-10-CM

## 2015-04-27 NOTE — Patient Instructions (Addendum)
Novolog 8---8---15 at meals  Egg in am instead of poptart  If am sugars stay <90 reduce Toujeo to 54  Stop Glimeperide

## 2015-04-27 NOTE — Progress Notes (Signed)
Patient ID: Alec [name], male   DOB: 16-Nov-1945, 69 y.o.   MRN: [id number]           Reason for Appointment:  Follow-up for Type 2 Diabetes  Referring physician:Shaw  History of Present Illness:          Diagnosis: Type 2 diabetes mellitus, date of diagnosis: 2001       Past history:  He was not symptomatic at time of diagnosis and was probably treated with Amaryl only.  No details are available of previous management However he thinks he was not given metformin at any time because of his "kidney problem" He had a A1c of over 9% in 2013 and may have been started on insulin at that time. Most likely has been taking Levemir and NovoLog since then A1c records show his results were over 8% in 2014 and in 4/15 but subsequently down to 7.1 in November 2015  Recent history: 7/16  INSULIN regimen is described as: Toujeo 57 units hs Novolog 07-09-17 units     He was seen in consultation in 11/2014 when his A1c was 7.4 but he was having fluctuating blood sugars with high readings in the evenings He was switched to Levemir 20 units twice a day and subsequently has been on Toujeo instead of Levemir once a day at bedtime Since his blood sugars were averaging over 200 he was given Victoza in addition to his basal bolus insulin regimen in 6/16 He has titrated this to 1.2 mg without any nausea.  He thinks that this is controlling his excessive hunger also  Also his Toujeo insulin was increased  His blood sugars were downloaded today from the One Touch monitor and blood sugar patterns are as follows:  His fasting readings are excellent and much improved as well as consistent  He has some fluctuation in his blood sugars later in the day but overall have been mostly near normal range  Blood sugars after his evening meals recently have been as low as 96  He has had sporadic hypoglycemia at lunch or supper time probably based on how much he is eating  He has however gained 5 pounds  He is  taking relatively larger doses of mealtime insulin at suppertime; also fairly compliant with taking the insulin before eating  Oral hypoglycemic drugs the patient is taking are: Glimeperide 4 mg daily    Side effects from medications have been:none  Compliance with the medical regimen: Good  Hypoglycemia: Twice as above recently  Glucose monitoring:  done 2-3 times a day Glucometer:  One Touch       Blood Glucose readings by time of day   Mean values apply above for all meters except median for One Touch  PRE-MEAL Fasting Lunch Dinner Bedtime Overall  Glucose range:  78-150   54-164   68-303   92-165    Mean/median:  119    120    124    Self-care: The diet that the patient has been following is: Relatively low fat    Meals: 3 meals per day. Dinner 6 pm Breakfast is usually an egg sandwich  Usually eating fast food at lunch and not usually low fat.   Eating a balanced meals in the evening.  He will have snacks on granola bars and yogurt raisins            Exercise:  walk sometimes in the evenings, about 1 mile, may be active at work  Dietician visit, most recent:6/15.               Weight history:  Wt Readings from Last 3 Encounters:  04/27/15 190 lb (86.183 kg)  03/29/15 185 lb (83.915 kg)  02/03/15 182 lb 12.8 oz (82.918 kg)    Glycemic control: last A1c was 7.1 in 11/15   Lab Results  Component Value Date   HGBA1C 7.3* 03/28/2015   HGBA1C 7.4* 12/14/2014   HGBA1C 9.3* 08/29/2012   Lab Results  Component Value Date   LDLCALC 65 03/24/2015   CREATININE 1.70* 03/24/2015         Medication List       This list is accurate as of: 04/27/15 11:59 PM.  Always use your most recent med list.               ACCU-CHEK SOFTCLIX LANCETS lancets  Use as instructed to check blood sugar 5-6 times per day dx code E11.9     amLODipine 10 MG tablet  Commonly known as:  NORVASC  Take 10 mg by mouth daily.     aspirin 81 MG tablet  Take 81 mg by mouth daily.       atorvastatin 20 MG tablet  Commonly known as:  LIPITOR  Take 20 mg by mouth daily.     Cinnamon 500 MG Tabs  Take 1,000 mg by mouth daily.     fish oil-omega-3 fatty acids 1000 MG capsule  Take 2 g by mouth daily.     glucose blood test strip  Commonly known as:  ACCU-CHEK AVIVA PLUS  Use as instructed to check blood sugar 5-6 times per day dx code E11.9     insulin aspart 100 UNIT/ML injection  Commonly known as:  novoLOG  10 units qam and 12 qhs     lisinopril 20 MG tablet  Commonly known as:  PRINIVIL,ZESTRIL  Take 20 mg by mouth daily.     lisinopril-hydrochlorothiazide 20-12.5 MG per tablet  Commonly known as:  PRINZIDE,ZESTORETIC     multivitamin tablet  Take 1 tablet by mouth daily.     [place] W/DEVICE Kit  Use to check blood sugar 2 times per day dx code E11.21     sertraline 100 MG tablet  Commonly known as:  ZOLOFT  Take 150 mg by mouth daily.     TOUJEO SOLOSTAR 300 UNIT/ML Sopn  Generic drug:  Insulin Glargine  INJECT 42 UNITS SUBCUTANEOUSLY DAILY     VICTOZA 18 MG/3ML Sopn  Generic drug:  Liraglutide  Inject 0.2 mLs (1.2 mg total) into the skin daily. Inject once daily at the same time     Vitamin D3 3000 UNITS Tabs  Take 1 tablet by mouth daily.        Allergies: No Known Allergies  Past Medical History  Diagnosis Date  . IDDM (insulin dependent diabetes mellitus)   . Hypertension     under control with meds., has been on med. > 10 yr.  . Degenerative arthritis of shoulder region 08/2013    left  . Disorder of bursae and tendons in left shoulder region 08/2013  . Hyperlipidemia     Past Surgical History  Procedure Laterality Date  . Cholecystectomy  08/29/2012    Procedure: LAPAROSCOPIC CHOLECYSTECTOMY WITH INTRAOPERATIVE CHOLANGIOGRAM;  Surgeon: Imogene Burn. Tsuei, MD;  Location: [facility];  Service: General;  Laterality: N/A;  . Transurethral resection of bladder tumor with mitomycin-c  08/24/2008  . Shoulder  arthroscopy with  subacromial decompression, rotator cuff repair and bicep tendon repair Left 09/03/2013    Procedure: LEFT SHOULDER ARTHROSCOPY WITH EXTENSIVE DEBRIDMENT, DISTAL CLAVICULECTOMY, ROTATOR CUFF REPAIR AND SUBACROMIAL DECOMPRESSION PARTIAL ACRIOMIOPLASTY WITH CORACOACROMIAL RELEASE;  Surgeon: Renette Butters, MD;  Location: [facility];  Service: Orthopedics;  Laterality: Left;    Family History  Problem Relation Age of Onset  . Diabetes Mother   . Cancer Father     Social History:  reports that he quit smoking about 17 years ago. He has never used smokeless tobacco. He reports that he does not drink alcohol or use illicit drugs.    Review of Systems   Most recent eye exam was 12/15.  Was told that he had no retinopathy       Lipids: he is taking Lipitor 20 mg, last triglycerides and LDL are normal       Lab Results  Component Value Date   CHOL 123 03/24/2015   HDL 32.90* 03/24/2015   LDLCALC 65 03/24/2015   TRIG 126.0 03/24/2015   CHOLHDL 4 03/24/2015                  The blood pressure has been treated with lisinopril HCT and also lisinopril.  He has been followed by nephrologist for diabetic nephropathy.   His last microalbumin/creatinine ratio was 393 and in 4/15 his 24-hour urine protein was 538 His creatinine is relatively higher than before, to see nephrologist next month   Lab Results  Component Value Date   CREATININE 1.70* 03/24/2015   BUN 23 03/24/2015   NA 138 03/24/2015   K 4.4 03/24/2015   CL 103 03/24/2015   CO2 28 03/24/2015      Physical Examination:  BP 136/80 mmHg  Pulse 66  Temp(Src) 98.2 F (36.8 C)  Resp 14  Ht $R'5\' 7"'Oy$  (1.702 m)  Wt 190 lb (86.183 kg)  BMI 29.75 kg/m2  SpO2 95%        ASSESSMENT:  Diabetes type 2, nonobese  See history of present illness for detailed discussion of his current management, blood sugar patterns and problems identified Currently on a regimen of Toujeo  and mealtime  insulin With adding VICTOZA his blood sugars have improved dramatically especially fasting readings; also requiring relatively larger doses of basal insulin now He has been able to comply better with diet Most of his blood sugars are excellent unless he has variability in his meals are carbohydrate intake   For now will continue the same regimen and will have him sign up for the indigent program for getting assistance for his medications  HYPERTENSION:  Blood pressure is controlled  RENAL dysfunction/nephropathy: His creatinine is much higher than previous ones of about 1.3-1.4 and will defer to nephrologist.  Not clear why he is taking both lisinopril and lisinopril HCT   PLAN:   Stop Amaryl  Better selection of balanced meals at breakfast  Reduce Novolog at breakfast and lunch by 2 units and at dinnertime by 3 units  Consider reducing Toujeo if morning sugars start getting lower  Follow-up with nephrologist   Patient Instructions  Novolog 8---8---15 at meals  Egg in am instead of poptart  If am sugars stay <90 reduce Toujeo to 54  Stop [place] 04/28/2015, 10:07 AM   Note: This office note was prepared with Estate agent. Any transcriptional errors that result from this process are unintentional.

## 2015-05-01 ENCOUNTER — Other Ambulatory Visit: Payer: Self-pay | Admitting: *Deleted

## 2015-05-01 ENCOUNTER — Telehealth: Payer: Self-pay | Admitting: Endocrinology

## 2015-05-01 MED ORDER — INSULIN GLARGINE 300 UNIT/ML ~~LOC~~ SOPN: 57.0000 [IU] | PEN_INJECTOR | Freq: Every day | SUBCUTANEOUS | Status: DC

## 2015-05-01 NOTE — Telephone Encounter (Signed)
Patient called stating that his pharmacy would not fill his Rx due to Dr. Dwyane Dee increasing his dosage  Rx: Toujeo (increased dosage)  [facility]: [facility]  Thank you

## 2015-06-23 ENCOUNTER — Other Ambulatory Visit (INDEPENDENT_AMBULATORY_CARE_PROVIDER_SITE_OTHER): Payer: Medicare Other

## 2015-06-23 DIAGNOSIS — E1165 Type 2 diabetes mellitus with hyperglycemia: Secondary | ICD-10-CM | POA: Diagnosis not present

## 2015-06-23 DIAGNOSIS — IMO0002 Reserved for concepts with insufficient information to code with codable children: Secondary | ICD-10-CM

## 2015-06-23 LAB — MICROALBUMIN / CREATININE URINE RATIO
Creatinine,U: 166 mg/dL
Microalb Creat Ratio: 9.2 mg/g (ref 0.0–30.0)
Microalb, Ur: 15.2 mg/dL — ABNORMAL HIGH (ref 0.0–1.9)

## 2015-06-23 LAB — BASIC METABOLIC PANEL
BUN: 24 mg/dL — ABNORMAL HIGH (ref 6–23)
CO2: 27 mEq/L (ref 19–32)
Calcium: 9.6 mg/dL (ref 8.4–10.5)
Chloride: 103 mEq/L (ref 96–112)
Creatinine, Ser: 1.62 mg/dL — ABNORMAL HIGH (ref 0.40–1.50)
GFR: 45.41 mL/min — ABNORMAL LOW (ref >60.00)
Glucose, Bld: 203 mg/dL — ABNORMAL HIGH (ref 70–99)
Potassium: 4.4 mEq/L (ref 3.5–5.1)
Sodium: 139 mEq/L (ref 135–145)

## 2015-06-23 LAB — HEMOGLOBIN A1C: Hgb A1c MFr Bld: 6.8 % — ABNORMAL HIGH (ref 4.6–6.5)

## 2015-06-28 ENCOUNTER — Ambulatory Visit (INDEPENDENT_AMBULATORY_CARE_PROVIDER_SITE_OTHER): Payer: Medicare Other | Admitting: Endocrinology

## 2015-06-28 ENCOUNTER — Encounter: Payer: Self-pay | Admitting: Endocrinology

## 2015-06-28 VITALS — BP 134/86 | HR 65 | Temp 98.4°F | Resp 16 | Ht 67.0 in | Wt 189.4 lb

## 2015-06-28 DIAGNOSIS — E1121 Type 2 diabetes mellitus with diabetic nephropathy: Secondary | ICD-10-CM

## 2015-06-28 DIAGNOSIS — Z23 Encounter for immunization: Secondary | ICD-10-CM | POA: Diagnosis not present

## 2015-06-28 DIAGNOSIS — IMO0002 Reserved for concepts with insufficient information to code with codable children: Secondary | ICD-10-CM

## 2015-06-28 DIAGNOSIS — E1165 Type 2 diabetes mellitus with hyperglycemia: Secondary | ICD-10-CM

## 2015-06-28 NOTE — Progress Notes (Signed)
Patient ID: Alec [name], male   DOB: [birth year], 69 y.o.   MRN: [id number]           Reason for Appointment:  Follow-up for Type 2 Diabetes  Referring physician:Shaw  History of Present Illness:          Diagnosis: Type 2 diabetes mellitus, date of diagnosis: 2001       Past history:  He was not symptomatic at time of diagnosis and was probably treated with Amaryl only.  No details are available of previous management However he thinks he was not given metformin at any time because of his "kidney problem" He had a A1c of over 9% in 2013 and may have been started on insulin at that time. Most likely has been taking Levemir and NovoLog since then A1c records show his results were over 8% in 2014 and in 4/15 but subsequently down to 7.1 in November 2015  Recent history:    INSULIN regimen is described as: Toujeo 57 units hs. Novolog 07-09-17 units     He was seen in consultation in 11/2014 when his A1c was 7.4; he was having high readings in the evenings He was switched to Levemir 20 units twice a day and subsequently has been on Toujeo instead of Levemir once a day at bedtime Since his blood sugars were averaging over 200 he was given Victoza in addition to his basal bolus insulin regimen in 6/16   With this his blood sugars have been overall better and he also has less hunger and better control of portions. Amaryl was stopped in 7/16 Now his A1c is below 7%  His blood sugars were downloaded today from the One Touch monitor and blood sugar patterns are as follows:  His fasting readings are relatively higher than before.  Not clear why.   His blood sugars are probably somewhat higher after lunch except when he is very active on weekends.   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 He thinks he is trying to eat more  Grilled foods when eating out.  He has not been checking readings after his evening meal much and only  occasionally in the afternoons  Blood sugars after his breakfast are totally better with improving his diet and getting protein.   No recent hypoglycemia    He is taking relatively larger dose of mealtime insulin at suppertime; also fairly compliant with taking the insulin before eating  Oral hypoglycemic drugs the patient is taking are: Glimeperide 4 mg daily    Side effects from medications have been:none  Compliance with the medical regimen: Good  Hypoglycemia: None  Glucose monitoring:  done 2-3 times a day Glucometer:  One Touch       Blood Glucose readings by time of day   Mean values apply above for all meters except median for One Touch  PRE-MEAL Fasting Lunch Dinner Bedtime Overall  Glucose range:  102-200    77-146   Mean/median:  142  170  141   146+/-32   POST-MEAL PC Breakfast PC Lunch PC Dinner  Glucose range:   82-202   Mean/median:      Self-care: The diet that the patient has been following is: Relatively low fat    Meals: 3 meals per day. Lunch  1 pm Dinner 6 pm Breakfast is usually an egg sandwich  Usually eating fast food at lunch and not usually low fat.  Eating a balanced meals in the evening.  He will have snacks on granola bars and yogurt raisins            Exercise:  walk sometimes in the evenings, about 1 mile, may be active at work          Dietician visit, most recent:6/15.               Weight history:  Wt Readings from Last 3 Encounters:  06/28/15 189 lb 6.4 oz (85.911 kg)  04/27/15 190 lb (86.183 kg)  03/29/15 185 lb (83.915 kg)    Glycemic control: last A1c was 7.1 in 11/15   Lab Results  Component Value Date   HGBA1C 6.8* 06/23/2015   HGBA1C 7.3* 03/28/2015   HGBA1C 7.4* 12/14/2014   Lab Results  Component Value Date   MICROALBUR 15.2* 06/23/2015   LDLCALC 65 03/24/2015   CREATININE 1.62* 06/23/2015         Medication List       This list is accurate as of: 06/28/15  9:21 PM.  Always use your most recent med list.                ACCU-CHEK SOFTCLIX LANCETS lancets  Use as instructed to check blood sugar 5-6 times per day dx code E11.9     amLODipine 10 MG tablet  Commonly known as:  NORVASC  Take 10 mg by mouth daily.     aspirin 81 MG tablet  Take 81 mg by mouth daily.     atorvastatin 20 MG tablet  Commonly known as:  LIPITOR  Take 20 mg by mouth daily.     Cinnamon 500 MG Tabs  Take 1,000 mg by mouth daily.     fish oil-omega-3 fatty acids 1000 MG capsule  Take 2 g by mouth daily.     glucose blood test strip  Commonly known as:  ACCU-CHEK AVIVA PLUS  Use as instructed to check blood sugar 5-6 times per day dx code E11.9     insulin aspart 100 UNIT/ML injection  Commonly known as:  novoLOG  10 units qam and 12 qhs     Insulin Glargine 300 UNIT/ML Sopn  Commonly known as:  TOUJEO SOLOSTAR  Inject 57 Units into the skin daily.     lisinopril 20 MG tablet  Commonly known as:  PRINIVIL,ZESTRIL  Take 20 mg by mouth daily.     lisinopril-hydrochlorothiazide 20-12.5 MG tablet  Commonly known as:  PRINZIDE,ZESTORETIC     multivitamin tablet  Take 1 tablet by mouth daily.     [place] W/DEVICE Kit  Use to check blood sugar 2 times per day dx code E11.21     sertraline 100 MG tablet  Commonly known as:  ZOLOFT  Take 150 mg by mouth daily.     VICTOZA 18 MG/3ML Sopn  Generic drug:  Liraglutide  Inject 0.2 mLs (1.2 mg total) into the skin daily. Inject once daily at the same time     Vitamin D3 3000 UNITS Tabs  Take 1 tablet by mouth daily.        Allergies: No Known Allergies  Past Medical History  Diagnosis Date  . IDDM (insulin dependent diabetes mellitus)   . Hypertension     under control with meds., has been on med. > 10 yr.  . Degenerative arthritis of shoulder region 08/2013    left  . Disorder of bursae and tendons in left shoulder region 08/2013  .  Hyperlipidemia     Past Surgical History  Procedure Laterality Date  . Cholecystectomy   08/29/2012    Procedure: LAPAROSCOPIC CHOLECYSTECTOMY WITH INTRAOPERATIVE CHOLANGIOGRAM;  Surgeon: Imogene Burn. Tsuei, MD;  Location: [facility];  Service: General;  Laterality: N/A;  . Transurethral resection of bladder tumor with mitomycin-c  08/24/2008  . Shoulder arthroscopy with subacromial decompression, rotator cuff repair and bicep tendon repair Left 09/03/2013    Procedure: LEFT SHOULDER ARTHROSCOPY WITH EXTENSIVE DEBRIDMENT, DISTAL CLAVICULECTOMY, ROTATOR CUFF REPAIR AND SUBACROMIAL DECOMPRESSION PARTIAL ACRIOMIOPLASTY WITH CORACOACROMIAL RELEASE;  Surgeon: Renette Butters, MD;  Location: [place];  Service: Orthopedics;  Laterality: Left;    Family History  Problem Relation Age of Onset  . Diabetes Mother   . Cancer Father     Social History:  reports that he quit smoking about 17 years ago. He has never used smokeless tobacco. He reports that he does not drink alcohol or use illicit drugs.    Review of Systems   Most recent eye exam was in 12/15.  Was told that he had no retinopathy       Lipids: he is taking Lipitor 20 mg, last triglycerides and LDL are normal       Lab Results  Component Value Date   CHOL 123 03/24/2015   HDL 32.90* 03/24/2015   LDLCALC 65 03/24/2015   TRIG 126.0 03/24/2015   CHOLHDL 4 03/24/2015                  The blood pressure has been treated with amlodipine and lisinopril HCT and also lisinopril.  He has been followed by nephrologist for diabetic nephropathy.   His  Previous microalbumin/creatinine ratio was 393 and in 4/15 his 24-hour urine protein was 538  now his urine microalbumin ratio is normal  His creatinine is slightly better at 1.62   Lab Results  Component Value Date   CREATININE 1.62* 06/23/2015   BUN 24* 06/23/2015   NA 139 06/23/2015   K 4.4 06/23/2015   CL 103 06/23/2015   CO2 27 06/23/2015      Physical Examination:  BP 134/86 mmHg  Pulse 65  Temp(Src) 98.4 F (36.9 C)  Resp 16  Ht $R'5\' 7"'Wx$   (1.702 m)  Wt 189 lb 6.4 oz (85.911 kg)  BMI 29.66 kg/m2  SpO2 94%        ASSESSMENT:  Diabetes type 2, nonobese  See history of present illness for detailed discussion of his current management, blood sugar patterns and problems identified Currently on a regimen of Toujeo, Victoza and mealtime insulin Although he has benefited from [place] he is complaining about the cost of this drug. He is able to get Toujeo with patient assistance and advised him to continue as this is better than Levemir for his 24-hour control More recently his fasting blood sugars are relatively higher for no apparent reason Generally he is trying to watch his diet even when he is out on the road.  Discussed needing to do more physical activity as he is relatively inactive during his workdays Highest blood sugars are probably fasting and sometimes after lunch with current regimen   HYPERTENSION:  Blood pressure is relatively high today  RENAL dysfunction/nephropathy: His creatinine is slightly better now and not clear why it has been higher this year He is still on lisinopril and lisinopril HCT giving him 40 mg of lisinopril a day However his microalbumin is better   PLAN:   Increase Toujeo to 16  units and continue taking it in the evening  At least on weekdays take 12 units of Novolog at lunch  He will need to reduce Novolog on weekends if he is planning to be very active after breakfast or lunch  More blood sugars after supper  Continue watching diet  Given information on indigent program for Victoza  More walking during the weekdays   Patient Instructions    Toujeo 60 units at nite  Novolog 10-in am 12 lunch-18 units supper   Check blood sugars on waking up .Marland Kitchen3-4  .Marland Kitchen times a week Also check blood sugars about 2 hours after a meal and do this after different meals by rotation  Recommended blood sugar levels on waking up is 90-130 and about 2 hours after meal is 140-180 Please bring blood  sugar monitor to each visit.  Appointment on 06/23/2015  Component Date Value Ref Range Status  . Hgb A1c MFr Bld 06/23/2015 6.8* 4.6 - 6.5 % Final   Glycemic Control Guidelines for People with Diabetes:Non Diabetic:  <6%Goal of Therapy: <7%Additional Action Suggested:  >8%   . Sodium 06/23/2015 139  135 - 145 mEq/L Final  . Potassium 06/23/2015 4.4  3.5 - 5.1 mEq/L Final  . Chloride 06/23/2015 103  96 - 112 mEq/L Final  . CO2 06/23/2015 27  19 - 32 mEq/L Final  . Glucose, Bld 06/23/2015 203* 70 - 99 mg/dL Final  . BUN 06/23/2015 24* 6 - 23 mg/dL Final  . Creatinine, Ser 06/23/2015 1.62* 0.40 - 1.50 mg/dL Final  . Calcium 06/23/2015 9.6  8.4 - 10.5 mg/dL Final  . GFR 06/23/2015 45.41* >60.00 mL/min Final  . Microalb, Ur 06/23/2015 15.2* 0.0 - 1.9 mg/dL Final  . Creatinine,U 06/23/2015 166.0   Final  . Microalb Creat Ratio 06/23/2015 9.2  0.0 - 30.0 mg/g Final      Counseling time on subjects discussed above is over 50% of today's 25 minute visit   Deloris Moger 06/28/2015, 9:21 PM   Note: This office note was prepared with Estate agent. Any transcriptional errors that result from this process are unintentional.

## 2015-06-28 NOTE — Patient Instructions (Signed)
Toujeo 60 units at [employer] 10-in am 12 lunch-18 units supper   Check blood sugars on waking up .Marland Kitchen3-4  .Marland Kitchen times a week Also check blood sugars about 2 hours after a meal and do this after different meals by rotation  Recommended blood sugar levels on waking up is 90-130 and about 2 hours after meal is 140-180 Please bring blood sugar monitor to each visit.  Appointment on 06/23/2015  Component Date Value Ref Range Status  . Hgb A1c MFr Bld 06/23/2015 6.8* 4.6 - 6.5 % Final   Glycemic Control Guidelines for People with Diabetes:Non Diabetic:  <6%Goal of Therapy: <7%Additional Action Suggested:  >8%   . Sodium 06/23/2015 139  135 - 145 mEq/L Final  . Potassium 06/23/2015 4.4  3.5 - 5.1 mEq/L Final  . Chloride 06/23/2015 103  96 - 112 mEq/L Final  . CO2 06/23/2015 27  19 - 32 mEq/L Final  . Glucose, Bld 06/23/2015 203* 70 - 99 mg/dL Final  . BUN 06/23/2015 24* 6 - 23 mg/dL Final  . Creatinine, Ser 06/23/2015 1.62* 0.40 - 1.50 mg/dL Final  . Calcium 06/23/2015 9.6  8.4 - 10.5 mg/dL Final  . GFR 06/23/2015 45.41* >60.00 mL/min Final  . Microalb, Ur 06/23/2015 15.2* 0.0 - 1.9 mg/dL Final  . Creatinine,U 06/23/2015 166.0   Final  . Microalb Creat Ratio 06/23/2015 9.2  0.0 - 30.0 mg/g Final

## 2015-07-04 DIAGNOSIS — D631 Anemia in chronic kidney disease: Secondary | ICD-10-CM | POA: Diagnosis not present

## 2015-07-04 DIAGNOSIS — R809 Proteinuria, unspecified: Secondary | ICD-10-CM | POA: Diagnosis not present

## 2015-07-04 DIAGNOSIS — I129 Hypertensive chronic kidney disease with stage 1 through stage 4 chronic kidney disease, or unspecified chronic kidney disease: Secondary | ICD-10-CM | POA: Diagnosis not present

## 2015-07-04 DIAGNOSIS — N183 Chronic kidney disease, stage 3 (moderate): Secondary | ICD-10-CM | POA: Diagnosis not present

## 2015-08-08 ENCOUNTER — Other Ambulatory Visit: Payer: Self-pay | Admitting: Endocrinology

## 2015-08-12 ENCOUNTER — Telehealth: Payer: Self-pay | Admitting: Endocrinology

## 2015-08-17 NOTE — Telephone Encounter (Signed)
I called Alec [name] back to let him know his insulin is here and also let him know that he needs to reapply if he still needs assistance. Patient will pick up the new application when he comes for his medication.

## 2015-08-17 NOTE — Telephone Encounter (Signed)
Pt returning your call regarding the meds

## 2015-08-29 ENCOUNTER — Other Ambulatory Visit: Payer: Self-pay | Admitting: Endocrinology

## 2015-09-14 ENCOUNTER — Telehealth: Payer: Self-pay | Admitting: Endocrinology

## 2015-09-19 NOTE — Telephone Encounter (Signed)
error 

## 2015-09-22 ENCOUNTER — Other Ambulatory Visit (INDEPENDENT_AMBULATORY_CARE_PROVIDER_SITE_OTHER): Payer: Medicare Other

## 2015-09-22 DIAGNOSIS — IMO0002 Reserved for concepts with insufficient information to code with codable children: Secondary | ICD-10-CM

## 2015-09-22 DIAGNOSIS — E1165 Type 2 diabetes mellitus with hyperglycemia: Secondary | ICD-10-CM

## 2015-09-22 LAB — COMPREHENSIVE METABOLIC PANEL
ALT: 27 U/L (ref 0–53)
AST: 22 U/L (ref 0–37)
Albumin: 4.2 g/dL (ref 3.5–5.2)
Alkaline Phosphatase: 66 U/L (ref 39–117)
BUN: 26 mg/dL — ABNORMAL HIGH (ref 6–23)
CO2: 28 mEq/L (ref 19–32)
Calcium: 10 mg/dL (ref 8.4–10.5)
Chloride: 104 mEq/L (ref 96–112)
Creatinine, Ser: 1.59 mg/dL — ABNORMAL HIGH (ref 0.40–1.50)
GFR: 46.36 mL/min — ABNORMAL LOW (ref >60.00)
Glucose, Bld: 166 mg/dL — ABNORMAL HIGH (ref 70–99)
Potassium: 4.5 mEq/L (ref 3.5–5.1)
Sodium: 139 mEq/L (ref 135–145)
Total Bilirubin: 0.8 mg/dL (ref 0.2–1.2)
Total Protein: 7.1 g/dL (ref 6.0–8.3)

## 2015-09-22 LAB — HEMOGLOBIN A1C: Hgb A1c MFr Bld: 7.3 % — ABNORMAL HIGH (ref 4.6–6.5)

## 2015-09-26 ENCOUNTER — Other Ambulatory Visit: Payer: Self-pay | Admitting: *Deleted

## 2015-09-26 MED ORDER — GLUCOSE BLOOD VI STRP: ORAL_STRIP | Status: DC

## 2015-09-27 ENCOUNTER — Encounter: Payer: Self-pay | Admitting: Endocrinology

## 2015-09-27 ENCOUNTER — Ambulatory Visit (INDEPENDENT_AMBULATORY_CARE_PROVIDER_SITE_OTHER): Payer: Medicare Other | Admitting: Endocrinology

## 2015-09-27 VITALS — BP 110/68 | HR 63 | Temp 97.9°F | Resp 14 | Ht 67.0 in | Wt 190.6 lb

## 2015-09-27 DIAGNOSIS — E1165 Type 2 diabetes mellitus with hyperglycemia: Secondary | ICD-10-CM | POA: Diagnosis not present

## 2015-09-27 DIAGNOSIS — R310 Gross hematuria: Secondary | ICD-10-CM | POA: Diagnosis not present

## 2015-09-27 DIAGNOSIS — N289 Disorder of kidney and ureter, unspecified: Secondary | ICD-10-CM

## 2015-09-27 DIAGNOSIS — Z794 Long term (current) use of insulin: Secondary | ICD-10-CM | POA: Diagnosis not present

## 2015-09-27 DIAGNOSIS — Z8551 Personal history of malignant neoplasm of bladder: Secondary | ICD-10-CM | POA: Diagnosis not present

## 2015-09-27 NOTE — Patient Instructions (Addendum)
Increase Toujeo to 62 and also 64 if am sugar stays > 130  More blood sugars after supper  If sugar >160  After supper use 20 Novolog  Walk indoors with dvd

## 2015-09-27 NOTE — Progress Notes (Signed)
Patient ID: Alec [name], male   DOB: 06/28/1946, 69 y.o.   MRN: [id number]           Reason for Appointment:  Follow-up for Type 2 Diabetes  Referring physician:Shaw  History of Present Illness:          Diagnosis: Type 2 diabetes mellitus, date of diagnosis: 2001       Past history:  He was not symptomatic at time of diagnosis and was probably treated with Amaryl only.  No details are available of previous management However he thinks he was not given metformin at any time because of his "kidney problem" He had a A1c of over 9% in 2013 and may have been started on insulin at that time. Most likely has been taking Levemir and NovoLog since then A1c records show his results were over 8% in 2014 and in 4/15 but subsequently down to 7.1 in November 2015  Recent history:    INSULIN regimen is described as: Toujeo 60 units hs. Novolog before meals 07-09-17 units     He was seen in consultation in 11/2014 when his A1c was 7.4; he was having high readings in the evenings He was switched to Levemir 20 units twice a day and subsequently has been on Toujeo instead of Levemir once a day at bedtime Since his blood sugars were averaging over 200 he was given Victoza in addition to his basal bolus insulin regimen in 6/16 Amaryl was stopped in 7/16  Although his A1c was below 7% on the last visit it is now higher at 7.3  His blood sugars were downloaded today from the One Touch Verio monitor, current patterns, problems identified and management:  His fasting readings are relatively higher again despite increasing Toujeo on the last visit  He has not been active lately especially with the winter weather  Overall her diet has been inconsistent in November and December  His fasting readings are averaging about 160, better only today at 135  He has done only rare blood sugars in the afternoons and evenings with variable results; probably has only one reading after supper which was 137   No  recent hypoglycemia    He is still taking Victoza and his weight has been unchanged  Oral hypoglycemic drugs the patient is taking are: Side effects from medications have been:none  Compliance with the medical regimen: Good  Hypoglycemia: None  Glucose monitoring:  done 2-3 times a day Glucometer:  One Touch       Blood Glucose readings by time of day   Mean values apply above for all meters except median for One Touch  PRE-MEAL Fasting Lunch Dinner Bedtime Overall  Glucose range:  135-195    102, to 63     Mean/median:  162     161   POST-MEAL PC Breakfast PC Lunch PC Dinner  Glucose range:  146-202     Mean/median:       Self-care: The diet that the patient has been following is: Relatively low fat    Meals: 3 meals per day. Lunch  1 pm Dinner 6 pm Breakfast is usually an egg sandwich  Usually eating fast food at lunch Eating a balanced meals in the evening.  He will have snacks on granola bars and yogurt raisins            Exercise:  walk sometimes in the evenings, his work is mostly driving now         [employer] visit,  most recent: 6/15.               Weight history:  Wt Readings from Last 3 Encounters:  09/27/15 190 lb 9.6 oz (86.456 kg)  06/28/15 189 lb 6.4 oz (85.911 kg)  04/27/15 190 lb (86.183 kg)    Glycemic control:    Lab Results  Component Value Date   HGBA1C 7.3* 09/22/2015   HGBA1C 6.8* 06/23/2015   HGBA1C 7.3* 03/28/2015   Lab Results  Component Value Date   MICROALBUR 15.2* 06/23/2015   LDLCALC 65 03/24/2015   CREATININE 1.59* 09/22/2015         Medication List       This list is accurate as of: 09/27/15  8:45 AM.  Always use your most recent med list.               ACCU-CHEK SOFTCLIX LANCETS lancets  Use as instructed to check blood sugar 5-6 times per day dx code T41.9     ONETOUCH DELICA LANCETS 62I Misc  USE TO CHECK BLOOD SUGAR 5-6 TIMES DAILY     amLODipine 10 MG tablet  Commonly known as:  NORVASC  Take 10 mg by mouth  daily.     aspirin 81 MG tablet  Take 81 mg by mouth daily.     atorvastatin 20 MG tablet  Commonly known as:  LIPITOR  Take 20 mg by mouth daily.     Cinnamon 500 MG Tabs  Take 1,000 mg by mouth daily.     fish oil-omega-3 fatty acids 1000 MG capsule  Take 2 g by mouth daily.     glucose blood test strip  Commonly known as:  ONETOUCH VERIO  Use as instructed to check blood sugar 6 times per day dx code E11.9     Insulin Glargine 300 UNIT/ML Sopn  Commonly known as:  TOUJEO SOLOSTAR  Inject 57 Units into the skin daily.     lisinopril 20 MG tablet  Commonly known as:  PRINIVIL,ZESTRIL  Take 20 mg by mouth daily.     lisinopril-hydrochlorothiazide 20-12.5 MG tablet  Commonly known as:  PRINZIDE,ZESTORETIC     multivitamin tablet  Take 1 tablet by mouth daily.     NOVOLOG 100 UNIT/ML injection  Generic drug:  insulin aspart  INJECT 10 UNITS SUBCUTANEOUSLY IN THE MORNING AND 12 UNITS SUBCUTANEOUSLY AT BEDTIME     ONETOUCH VERIO SYNC SYSTEM w/Device Kit  Use to check blood sugar 2 times per day dx code E11.21     sertraline 100 MG tablet  Commonly known as:  ZOLOFT  Take 150 mg by mouth daily.     VICTOZA 18 MG/3ML Sopn  Generic drug:  Liraglutide  INJECT 1.2 MG SUBCUTANEOUSLY DAILY AT THE SAME TIME     Vitamin D3 3000 units Tabs  Take 1 tablet by mouth daily.        Allergies: No Known Allergies  Past Medical History  Diagnosis Date  . IDDM (insulin dependent diabetes mellitus) ([place])   . Hypertension     under control with meds., has been on med. > 10 yr.  . Degenerative arthritis of shoulder region 08/2013    left  . Disorder of bursae and tendons in left shoulder region 08/2013  . Hyperlipidemia     Past Surgical History  Procedure Laterality Date  . Cholecystectomy  08/29/2012    Procedure: LAPAROSCOPIC CHOLECYSTECTOMY WITH INTRAOPERATIVE CHOLANGIOGRAM;  Surgeon: Imogene Burn. Georgette Dover, MD;  Location: [facility];  Service: General;  Laterality: N/A;  .  Transurethral resection of bladder tumor with mitomycin-c  08/24/2008  . Shoulder arthroscopy with subacromial decompression, rotator cuff repair and bicep tendon repair Left 09/03/2013    Procedure: LEFT SHOULDER ARTHROSCOPY WITH EXTENSIVE DEBRIDMENT, DISTAL CLAVICULECTOMY, ROTATOR CUFF REPAIR AND SUBACROMIAL DECOMPRESSION PARTIAL ACRIOMIOPLASTY WITH CORACOACROMIAL RELEASE;  Surgeon: Renette Butters, MD;  Location: [place];  Service: Orthopedics;  Laterality: Left;    Family History  Problem Relation Age of Onset  . Diabetes Mother   . Cancer Father     Social History:  reports that he quit smoking about 18 years ago. He has never used smokeless tobacco. He reports that he does not drink alcohol or use illicit drugs.    Review of Systems   Most recent eye exam was in 12/15.  Was told that he had no retinopathy       Lipids: he is taking Lipitor 20 mg, last triglycerides and LDL are normal       Lab Results  Component Value Date   CHOL 123 03/24/2015   HDL 32.90* 03/24/2015   LDLCALC 65 03/24/2015   TRIG 126.0 03/24/2015   CHOLHDL 4 03/24/2015                  The blood pressure has been treated with amlodipine and lisinopril HCT and also lisinopril. Home reading recently 130/70 Occasionally may feel lightheaded. Blood pressure also followed by nephrologist  He has been followed by nephrologist for diabetic nephropathy.   His  Previous microalbumin/creatinine ratio was 393 and in 4/15 his 24-hour urine protein was 538  Recently his urine microalbumin ratio is normal  His creatinine is slightly better     Lab Results  Component Value Date   CREATININE 1.59* 09/22/2015   BUN 26* 09/22/2015   NA 139 09/22/2015   K 4.5 09/22/2015   CL 104 09/22/2015   CO2 28 09/22/2015      Physical Examination:  BP 110/68 mmHg  Pulse 63  Temp(Src) 97.9 F (36.6 C)  Resp 14  Ht _0  (1.702 m)  Wt 190 lb 9.6 oz (86.456 kg)  BMI 29.85 kg/m2  SpO2 96%        standing blood pressure 115/76 No edema  ASSESSMENT:  Diabetes type 2, nonobese  See history of present illness for detailed discussion of his current management, blood sugar patterns and problems identified Currently on a regimen of Toujeo, Victoza and mealtime insulin As discussed above he is checking only a few readings after meals and not clear if he is having hyperglycemia especially after supper Although he has not gained weight he has been less active and probably inconsistent on diet on the holidays  Still requiring relatively large amounts of basal insulin Not a candidate for Invokana since his renal functions impaired  HYPERTENSION:  Blood pressure is relatively better today without orthostasis  RENAL dysfunction/nephropathy: His creatinine is slightly better now He is still on lisinopril and lisinopril HCT giving him 40 mg of lisinopril a day   PLAN:   Increase Toujeo to at least 62 units and continue taking it in the evening, to increase the dose further by 2 units if fasting readings stay over 130  At least try and/or walking or other exercise regimen on the fitness video he has at home  He will need to increase Novolog at suppertime if spending readings are consistently over 160  May need to adjust Novolog based on meal size  Consistent  diet  Follow-up in 3 months again   Patient Instructions  Increase Toujeo to 62 and also 64 if am sugar stays > 130  More blood sugars after supper  If sugar >160  After supper use 20 Novolog  Walk indoors with dvd  Counseling time on subjects discussed above is over 50% of today's 25 minute visit   Alec [name] 09/27/2015, 8:45 AM   Note: This office note was prepared with Dragon voice recognition system technology. Any transcriptional errors that result from this process are unintentional.

## 2015-10-30 DIAGNOSIS — L988 Other specified disorders of the skin and subcutaneous tissue: Secondary | ICD-10-CM | POA: Diagnosis not present

## 2015-11-17 DIAGNOSIS — E119 Type 2 diabetes mellitus without complications: Secondary | ICD-10-CM | POA: Diagnosis not present

## 2015-11-17 DIAGNOSIS — J300 Vasomotor rhinitis: Secondary | ICD-10-CM | POA: Diagnosis not present

## 2015-11-17 DIAGNOSIS — J019 Acute sinusitis, unspecified: Secondary | ICD-10-CM | POA: Diagnosis not present

## 2015-12-15 ENCOUNTER — Other Ambulatory Visit: Payer: Self-pay | Admitting: Endocrinology

## 2015-12-16 ENCOUNTER — Other Ambulatory Visit: Payer: Self-pay | Admitting: Endocrinology

## 2015-12-21 ENCOUNTER — Other Ambulatory Visit (INDEPENDENT_AMBULATORY_CARE_PROVIDER_SITE_OTHER): Payer: Medicare Other

## 2015-12-21 DIAGNOSIS — Z794 Long term (current) use of insulin: Secondary | ICD-10-CM | POA: Diagnosis not present

## 2015-12-21 DIAGNOSIS — E1165 Type 2 diabetes mellitus with hyperglycemia: Secondary | ICD-10-CM

## 2015-12-21 LAB — COMPREHENSIVE METABOLIC PANEL
ALT: 33 U/L (ref 0–53)
AST: 21 U/L (ref 0–37)
Albumin: 4.2 g/dL (ref 3.5–5.2)
Alkaline Phosphatase: 66 U/L (ref 39–117)
BUN: 29 mg/dL — ABNORMAL HIGH (ref 6–23)
CO2: 27 mEq/L (ref 19–32)
Calcium: 9.8 mg/dL (ref 8.4–10.5)
Chloride: 104 mEq/L (ref 96–112)
Creatinine, Ser: 1.65 mg/dL — ABNORMAL HIGH (ref 0.40–1.50)
GFR: 44.39 mL/min — ABNORMAL LOW (ref >60.00)
Glucose, Bld: 159 mg/dL — ABNORMAL HIGH (ref 70–99)
Potassium: 4.2 mEq/L (ref 3.5–5.1)
Sodium: 137 mEq/L (ref 135–145)
Total Bilirubin: 0.8 mg/dL (ref 0.2–1.2)
Total Protein: 7.1 g/dL (ref 6.0–8.3)

## 2015-12-21 LAB — HEMOGLOBIN A1C: Hgb A1c MFr Bld: 6.8 % — ABNORMAL HIGH (ref 4.6–6.5)

## 2015-12-26 ENCOUNTER — Ambulatory Visit: Payer: PRIVATE HEALTH INSURANCE | Admitting: Endocrinology

## 2016-01-01 ENCOUNTER — Ambulatory Visit (INDEPENDENT_AMBULATORY_CARE_PROVIDER_SITE_OTHER): Payer: Medicare Other | Admitting: Endocrinology

## 2016-01-01 ENCOUNTER — Encounter: Payer: Self-pay | Admitting: Endocrinology

## 2016-01-01 VITALS — BP 124/60 | HR 63 | Temp 98.5°F | Resp 16 | Ht 67.0 in | Wt 190.4 lb

## 2016-01-01 DIAGNOSIS — Z794 Long term (current) use of insulin: Secondary | ICD-10-CM

## 2016-01-01 DIAGNOSIS — E1165 Type 2 diabetes mellitus with hyperglycemia: Secondary | ICD-10-CM | POA: Diagnosis not present

## 2016-01-01 MED ORDER — ROPINIROLE HCL 0.5 MG PO TABS: 0.5000 mg | ORAL_TABLET | Freq: Every day | ORAL | Status: DC

## 2016-01-01 NOTE — Progress Notes (Signed)
Patient ID: Alec [name], male   DOB: 08/13/1946, 70 y.o.   MRN: [id number]           Reason for Appointment:  Follow-up for Type 2 Diabetes  Referring physician:Shaw  History of Present Illness:          Diagnosis: Type 2 diabetes mellitus, date of diagnosis: 2001       Past history:  He was not symptomatic at time of diagnosis and was probably treated with Amaryl only.  No details are available of previous management However he thinks he was not given metformin at any time because of his "kidney problem" He had a A1c of over 9% in 2013 and may have been started on insulin at that time. Most likely has been taking Levemir and NovoLog since then A1c records show his results were over 8% in 2014 and in 4/15 but subsequently down to 7.1 in November 2015  Recent history:    INSULIN regimen is described as: Toujeo 62 units hs. Novolog before meals 07-09-17 units     He was seen in consultation in 11/2014 when his A1c was 7.4; he was having high readings in the evenings He was switched to Levemir 20 units twice a day and subsequently has been on Toujeo instead of Levemir once a day at bedtime Since his blood sugars were averaging over 200 he was given Victoza in addition to his basal bolus insulin regimen in 6/16 Amaryl was stopped in 7/16  His A1c has improved back to 6.8% now  His blood sugars were downloaded today from the One Touch Verio monitor, current patterns, problems identified and management:  His blood sugar control appears to be better with his trying to be more active  He has not adjusted his insulin since his last visit as fasting readings are improving  Overall her diet has been better than on his last visit also  He has only occasional high readings after meals but not consistently, based on carbohydrate intake or when he is eating out; may tend to eat larger portions or desserts when going out  He has had only one relatively lower blood sugar in the evening from  overestimating his insulin doses at supper  He is still taking VICTOZA and his weight has been unchanged  Oral hypoglycemic drugs the patient is taking are: None Side effects from medications have been:none  Compliance with the medical regimen: Good  Hypoglycemia: None  Glucose monitoring:  done 2-3 times a day Glucometer:  One Touch       Blood Glucose readings by time of day   Mean values apply above for all meters except median for One Touch  PRE-MEAL Fasting Lunch Dinner Bedtime Overall  Glucose range: 103-165  66-238 76-150  92, 326    Mean/median: 132  164    133+/-50     Self-care: The diet that the patient has been following is: Relatively low fat    Meals: 3 meals per day. Lunch  1 pm Dinner 6 pm Breakfast is usually an egg sandwich/pop tart  Usually eating fast food at lunch Eating a balanced meals in the evening.  He will have snacks on granola bars and             Exercise:  walk sometimes in the evenings, golf         Dietician visit, most recent: 6/15.               Weight history:  Wt  Readings from Last 3 Encounters:  01/01/16 190 lb 6.4 oz (86.365 kg)  09/27/15 190 lb 9.6 oz (86.456 kg)  06/28/15 189 lb 6.4 oz (85.911 kg)    Glycemic control:    Lab Results  Component Value Date   HGBA1C 6.8* 12/21/2015   HGBA1C 7.3* 09/22/2015   HGBA1C 6.8* 06/23/2015   Lab Results  Component Value Date   MICROALBUR 15.2* 06/23/2015   LDLCALC 65 03/24/2015   CREATININE 1.65* 12/21/2015         Medication List       This list is accurate as of: 01/01/16 11:59 PM.  Always use your most recent med list.               ACCU-CHEK SOFTCLIX LANCETS lancets  Use as instructed to check blood sugar 5-6 times per day dx code E95.2     ONETOUCH DELICA LANCETS 84X Misc  USE TO CHECK BLOOD SUGAR 5-6 TIMES DAILY     amLODipine 10 MG tablet  Commonly known as:  NORVASC  Take 10 mg by mouth daily.     aspirin 81 MG tablet  Take 81 mg by mouth daily.      atorvastatin 20 MG tablet  Commonly known as:  LIPITOR  Take 20 mg by mouth daily.     Cinnamon 500 MG Tabs  Take 1,000 mg by mouth daily.     fish oil-omega-3 fatty acids 1000 MG capsule  Take 2 g by mouth daily.     glucose blood test strip  Commonly known as:  ONETOUCH VERIO  Use as instructed to check blood sugar 6 times per day dx code E11.9     Insulin Glargine 300 UNIT/ML Sopn  Commonly known as:  TOUJEO SOLOSTAR  Inject 57 Units into the skin daily.     lisinopril 20 MG tablet  Commonly known as:  PRINIVIL,ZESTRIL  Take 20 mg by mouth daily.     lisinopril-hydrochlorothiazide 20-12.5 MG tablet  Commonly known as:  PRINZIDE,ZESTORETIC     multivitamin tablet  Take 1 tablet by mouth daily.     NOVOLOG 100 UNIT/ML injection  Generic drug:  insulin aspart  INJECT 10 UNITS SUBCUTANEOUSLY IN THE MORNING AND 12 UNITS SUBCUTANEOUSLY AT BEDTIME     ONETOUCH VERIO SYNC SYSTEM w/Device Kit  Use to check blood sugar 2 times per day dx code E11.21     rOPINIRole 0.5 MG tablet  Commonly known as:  REQUIP  Take 1 tablet (0.5 mg total) by mouth at bedtime.     sertraline 100 MG tablet  Commonly known as:  ZOLOFT  Take 150 mg by mouth daily.     VICTOZA 18 MG/3ML Sopn  Generic drug:  Liraglutide  INJECT 1.2 MG SUBCUTANEOUSLY ONCE DAILY AT THE SAME TIME     Vitamin D3 3000 units Tabs  Take 1 tablet by mouth daily.        Allergies: No Known Allergies  Past Medical History  Diagnosis Date  . IDDM (insulin dependent diabetes mellitus) ([place])   . Hypertension     under control with meds., has been on med. > 10 yr.  . Degenerative arthritis of shoulder region 08/2013    left  . Disorder of bursae and tendons in left shoulder region 08/2013  . Hyperlipidemia     Past Surgical History  Procedure Laterality Date  . Cholecystectomy  08/29/2012    Procedure: LAPAROSCOPIC CHOLECYSTECTOMY WITH INTRAOPERATIVE CHOLANGIOGRAM;  Surgeon: Imogene Burn. Georgette Dover, MD;  Location:  [facility];  Service: General;  Laterality: N/A;  . Transurethral resection of bladder tumor with mitomycin-c  08/24/2008  . Shoulder arthroscopy with subacromial decompression, rotator cuff repair and bicep tendon repair Left 09/03/2013    Procedure: LEFT SHOULDER ARTHROSCOPY WITH EXTENSIVE DEBRIDMENT, DISTAL CLAVICULECTOMY, ROTATOR CUFF REPAIR AND SUBACROMIAL DECOMPRESSION PARTIAL ACRIOMIOPLASTY WITH CORACOACROMIAL RELEASE;  Surgeon: Renette Butters, MD;  Location: [facility];  Service: Orthopedics;  Laterality: Left;    Family History  Problem Relation Age of Onset  . Diabetes Mother   . Cancer Father     Social History:  reports that he quit smoking about 18 years ago. He has never used smokeless tobacco. He reports that he does not drink alcohol or use illicit drugs.    Review of Systems    he is asking for treatment for restless legs   Most recent eye exam was in 12/15.  Was told that he had no retinopathy       Lipids: he is taking Lipitor 20 mg, last triglycerides and LDL are normal       Lab Results  Component Value Date   CHOL 123 03/24/2015   HDL 32.90* 03/24/2015   LDLCALC 65 03/24/2015   TRIG 126.0 03/24/2015   CHOLHDL 4 03/24/2015                  The blood pressure has been treated with amlodipine and lisinopril HCT and also lisinopril. Home reading recently 130/70 Occasionally may feel lightheaded. Blood pressure also followed by nephrologist  He has been followed by nephrologist for diabetic nephropathy.   His  Previous microalbumin/creatinine ratio was 393 and in 4/15 his 24-hour urine protein was 538  Recently his urine microalbumin ratio is normal  His creatinine is slightly better     Lab Results  Component Value Date   CREATININE 1.65* 12/21/2015   BUN 29* 12/21/2015   NA 137 12/21/2015   K 4.2 12/21/2015   CL 104 12/21/2015   CO2 27 12/21/2015      Physical Examination:  BP 124/60 mmHg  Pulse 63  Temp(Src) 98.5 F (36.9 C)   Resp 16  Ht '5\' 7"'$  (1.702 m)  Wt 190 lb 6.4 oz (86.365 kg)  BMI 29.81 kg/m2  SpO2 96%       ASSESSMENT:  Diabetes type 2, nonobese  See history of present illness for detailed discussion of his current management, blood sugar patterns and problems identified Currently on a regimen of Toujeo, Victoza and mealtime insulin   His glucose control is improving , without much change in his treatment regimen  And likely to be because of better diet and exercise compared to the last visit  HYPERTENSION:  Blood pressure is  Controlled  RENAL dysfunction/nephropathy: His creatinine is stable   RESTLESS leg   Likely idiopathic   PLAN:    No change in treatment regimen  Follow-up in 3 months again   Requip 0.5 mg at bedtime for restless legs and follow-up with PCP   There are no Patient Instructions on file for this visit.     Imojean Yoshino 01/02/2016, 10:00 AM   Note: This office note was prepared with Estate agent. Any transcriptional errors that result from this process are unintentional.

## 2016-01-10 ENCOUNTER — Other Ambulatory Visit: Payer: Self-pay | Admitting: Endocrinology

## 2016-01-12 DIAGNOSIS — M7712 Lateral epicondylitis, left elbow: Secondary | ICD-10-CM | POA: Diagnosis not present

## 2016-02-01 ENCOUNTER — Telehealth: Payer: Self-pay | Admitting: Endocrinology

## 2016-02-01 NOTE — Telephone Encounter (Signed)
Pt said that his medication went from a Tier 2 to a Tier 3 and that he was told that he needs to contact his doctor's office to get this fixed.

## 2016-02-02 NOTE — Telephone Encounter (Signed)
See note below and please advise, Thanks! 

## 2016-02-02 NOTE — Telephone Encounter (Signed)
Needs to check other options from [employer]

## 2016-02-02 NOTE — Telephone Encounter (Signed)
Pt states that the [place] are too expensive and fears that the novolog will be too the next time he picks this up  Pt calling insurance company

## 2016-02-05 NOTE — Telephone Encounter (Signed)
Left a vm advising the pt of the note below. Requested a call back from the pt to further discuss.

## 2016-02-12 ENCOUNTER — Other Ambulatory Visit: Payer: Self-pay | Admitting: *Deleted

## 2016-02-12 MED ORDER — INSULIN LISPRO 100 UNIT/ML ~~LOC~~ SOLN: SUBCUTANEOUS | Status: DC

## 2016-02-13 ENCOUNTER — Other Ambulatory Visit: Payer: Self-pay | Admitting: *Deleted

## 2016-02-13 MED ORDER — INSULIN ASPART 100 UNIT/ML ~~LOC~~ SOLN: SUBCUTANEOUS | Status: DC

## 2016-02-15 ENCOUNTER — Other Ambulatory Visit: Payer: Self-pay | Admitting: Endocrinology

## 2016-02-23 DIAGNOSIS — F411 Generalized anxiety disorder: Secondary | ICD-10-CM | POA: Diagnosis not present

## 2016-02-23 DIAGNOSIS — E1122 Type 2 diabetes mellitus with diabetic chronic kidney disease: Secondary | ICD-10-CM | POA: Diagnosis not present

## 2016-02-23 DIAGNOSIS — N183 Chronic kidney disease, stage 3 (moderate): Secondary | ICD-10-CM | POA: Diagnosis not present

## 2016-02-23 DIAGNOSIS — M109 Gout, unspecified: Secondary | ICD-10-CM | POA: Diagnosis not present

## 2016-02-23 DIAGNOSIS — Z125 Encounter for screening for malignant neoplasm of prostate: Secondary | ICD-10-CM | POA: Diagnosis not present

## 2016-02-23 DIAGNOSIS — Z6830 Body mass index (BMI) 30.0-30.9, adult: Secondary | ICD-10-CM | POA: Diagnosis not present

## 2016-02-23 DIAGNOSIS — N2581 Secondary hyperparathyroidism of renal origin: Secondary | ICD-10-CM | POA: Diagnosis not present

## 2016-02-23 DIAGNOSIS — M150 Primary generalized (osteo)arthritis: Secondary | ICD-10-CM | POA: Diagnosis not present

## 2016-02-23 DIAGNOSIS — E669 Obesity, unspecified: Secondary | ICD-10-CM | POA: Diagnosis not present

## 2016-02-23 DIAGNOSIS — Z0000 Encounter for general adult medical examination without abnormal findings: Secondary | ICD-10-CM | POA: Diagnosis not present

## 2016-02-23 DIAGNOSIS — E782 Mixed hyperlipidemia: Secondary | ICD-10-CM | POA: Diagnosis not present

## 2016-02-23 DIAGNOSIS — R972 Elevated prostate specific antigen [PSA]: Secondary | ICD-10-CM | POA: Diagnosis not present

## 2016-02-23 DIAGNOSIS — I129 Hypertensive chronic kidney disease with stage 1 through stage 4 chronic kidney disease, or unspecified chronic kidney disease: Secondary | ICD-10-CM | POA: Diagnosis not present

## 2016-03-28 ENCOUNTER — Other Ambulatory Visit (INDEPENDENT_AMBULATORY_CARE_PROVIDER_SITE_OTHER): Payer: Medicare Other

## 2016-03-28 DIAGNOSIS — Z794 Long term (current) use of insulin: Secondary | ICD-10-CM | POA: Diagnosis not present

## 2016-03-28 DIAGNOSIS — E1165 Type 2 diabetes mellitus with hyperglycemia: Secondary | ICD-10-CM

## 2016-03-28 LAB — BASIC METABOLIC PANEL
BUN: 19 mg/dL (ref 6–23)
CO2: 27 mEq/L (ref 19–32)
Calcium: 9.8 mg/dL (ref 8.4–10.5)
Chloride: 103 mEq/L (ref 96–112)
Creatinine, Ser: 1.46 mg/dL (ref 0.40–1.50)
GFR: 51.08 mL/min — ABNORMAL LOW (ref >60.00)
Glucose, Bld: 152 mg/dL — ABNORMAL HIGH (ref 70–99)
Potassium: 4.3 mEq/L (ref 3.5–5.1)
Sodium: 136 mEq/L (ref 135–145)

## 2016-03-28 LAB — LIPID PANEL
Cholesterol: 108 mg/dL (ref 0–200)
HDL: 28.8 mg/dL — ABNORMAL LOW (ref >39.00)
LDL Cholesterol: 54 mg/dL (ref 0–99)
NonHDL: 79.67
Total CHOL/HDL Ratio: 4
Triglycerides: 130 mg/dL (ref 0.0–149.0)
VLDL: 26 mg/dL (ref 0.0–40.0)

## 2016-03-28 LAB — HEMOGLOBIN A1C: Hgb A1c MFr Bld: 6.6 % — ABNORMAL HIGH (ref 4.6–6.5)

## 2016-04-01 ENCOUNTER — Ambulatory Visit (INDEPENDENT_AMBULATORY_CARE_PROVIDER_SITE_OTHER): Payer: Medicare Other | Admitting: Endocrinology

## 2016-04-01 VITALS — BP 114/56 | HR 41 | Ht 67.0 in | Wt 191.0 lb

## 2016-04-01 DIAGNOSIS — Z794 Long term (current) use of insulin: Secondary | ICD-10-CM

## 2016-04-01 DIAGNOSIS — E1165 Type 2 diabetes mellitus with hyperglycemia: Secondary | ICD-10-CM

## 2016-04-01 MED ORDER — INSULIN DETEMIR 100 UNIT/ML FLEXPEN: 60.0000 [IU] | PEN_INJECTOR | Freq: Every day | SUBCUTANEOUS | Status: DC

## 2016-04-01 NOTE — Patient Instructions (Signed)
Check blood sugars on waking up  4 times a week Also check blood sugars about 2 hours after a meal and do this after different meals by rotation  Recommended blood sugar levels on waking up is 90-130 and about 2 hours after meal is 130-160  Please bring your blood sugar monitor to each visit, thank you

## 2016-04-01 NOTE — Progress Notes (Signed)
Patient ID: Alec [name], male   DOB: September 29, 1946, 70 y.o.   MRN: [id number]           Reason for Appointment:  Follow-up for Type 2 Diabetes  Referring physician:Shaw  History of Present Illness:          Diagnosis: Type 2 diabetes mellitus, date of diagnosis: 2001       Past history:  He was not symptomatic at time of diagnosis and was probably treated with Amaryl only.  No details are available of previous management However he thinks he was not given metformin at any time because of his "kidney problem" He had a A1c of over 9% in 2013 and may have been started on insulin at that time. Most likely has been taking Levemir and NovoLog since then A1c records show his results were over 8% in 2014 and in 4/15 but subsequently down to 7.1 in November 2015 Amaryl was stopped in 7/16  Recent history:    INSULIN regimen is described as: Toujeo 62 units hs. Novolog before meals 07-11-17 units     He was seen in consultation in 11/2014 when his A1c was 7.4; he was having high readings in the evenings He was switched to Levemir 20 units twice a day and subsequently has been on Toujeo instead of Levemir once a day at bedtime Since his blood sugars were averaging over 200 he was given Victoza in addition to his basal bolus insulin regimen in 6/16  His A1c has improved 6.6, previously 6.8  His blood sugars were downloaded today from the One Touch Verio monitor, current patterns, problems identified and management:  His blood sugar readings are somewhat variable but most of the high readings were related to getting steroids for his gout about 2 weeks ago  He still has a little fluctuation in the morning sugars even though most of his readings at bedtime are not high  Blood sugars in the afternoons are relatively lower and occasionally has had a low sugar with increased physical activity for which he does not compensate with the change in insulin or preventative snack  He is concerned about  the cost of diabetes medications, last year his Levemir was relatively more expensive compared to Toujeo  He is  taking VICTOZA without side effects and his weight has been unchanged  Oral hypoglycemic drugs the patient is taking are: None Side effects from medications have been:none  Compliance with the medical regimen: Good  Hypoglycemia: None  Glucose monitoring:  done 2-3 times a day Glucometer:  One Touch       Blood Glucose readings by time of day   Mean values apply above for all meters except median for One Touch  PRE-MEAL Fasting Lunch Dinner Bedtime Overall  Glucose range: 93-269  98-245   58-270    Mean/median: 146   194   139    Self-care: The diet that the patient has been following is: Relatively low fat    Meals: 3 meals per day. Lunch  1 pm Dinner 6 pm Breakfast is usually an egg sandwich/pop tart   Sometimes eating fast food at lunch Eating a balanced meals in the evening.  He will have snacks on granola bars              Exercise:  walk sometimes in the evenings, golf         Dietician visit, most recent: 6/15.  Weight history:  Wt Readings from Last 3 Encounters:  04/01/16 191 lb (86.637 kg)  01/01/16 190 lb 6.4 oz (86.365 kg)  09/27/15 190 lb 9.6 oz (86.456 kg)    Glycemic control:    Lab Results  Component Value Date   HGBA1C 6.6* 03/28/2016   HGBA1C 6.8* 12/21/2015   HGBA1C 7.3* 09/22/2015   Lab Results  Component Value Date   MICROALBUR 15.2* 06/23/2015   LDLCALC 54 03/28/2016   CREATININE 1.46 03/28/2016         Medication List       This list is accurate as of: 04/01/16  8:41 AM.  Always use your most recent med list.               ACCU-CHEK SOFTCLIX LANCETS lancets  Use as instructed to check blood sugar 5-6 times per day dx code X83.3     ONETOUCH DELICA LANCETS 82N Misc  USE TO CHECK BLOOD SUGAR 5-6 TIMES DAILY     amLODipine 10 MG tablet  Commonly known as:  NORVASC  Take 10 mg by mouth daily.      aspirin 81 MG tablet  Take 81 mg by mouth daily.     atorvastatin 20 MG tablet  Commonly known as:  LIPITOR  Take 20 mg by mouth daily.     Cinnamon 500 MG Tabs  Take 1,000 mg by mouth daily.     fish oil-omega-3 fatty acids 1000 MG capsule  Take 2 g by mouth daily.     glucose blood test strip  Commonly known as:  ONETOUCH VERIO  Use as instructed to check blood sugar 6 times per day dx code E11.9     insulin aspart 100 UNIT/ML injection  Commonly known as:  NOVOLOG  Inject 10 units in the morning, 12 units at lunch and 18 units at dinner     Insulin Detemir 100 UNIT/ML Pen  Commonly known as:  LEVEMIR  Inject 60 Units into the skin daily at 10 pm.     insulin lispro 100 UNIT/ML injection  Commonly known as:  HUMALOG  Inject 10 units in am, 12 units in the afternoon and 18 units in the evening     lisinopril 20 MG tablet  Commonly known as:  PRINIVIL,ZESTRIL  Take 20 mg by mouth daily.     lisinopril-hydrochlorothiazide 20-12.5 MG tablet  Commonly known as:  PRINZIDE,ZESTORETIC     multivitamin tablet  Take 1 tablet by mouth daily.     [place] w/Device Kit  Use to check blood sugar 2 times per day dx code E11.21     rOPINIRole 0.5 MG tablet  Commonly known as:  REQUIP  Take 1 tablet (0.5 mg total) by mouth at bedtime.     sertraline 100 MG tablet  Commonly known as:  ZOLOFT  Take 150 mg by mouth daily.     TOUJEO SOLOSTAR 300 UNIT/ML Sopn  Generic drug:  Insulin Glargine  INJECT 57 UNITS SUBCUTANEOUSLY ONCE DAILY     VICTOZA 18 MG/3ML Sopn  Generic drug:  Liraglutide  INJECT 1.2 MG SUBCUTANEOUSLY ONCE DAILY AT THE SAME TIME     VICTOZA 18 MG/3ML Sopn  Generic drug:  Liraglutide  INJECT 1.2MG SUBCTANEOUSLY ONCE DAILY AT THE SAME TIME     Vitamin D3 3000 units Tabs  Take 1 tablet by mouth daily.        Allergies: No Known Allergies  Past Medical History  Diagnosis Date  .  IDDM (insulin dependent diabetes mellitus) ([place])   .  Hypertension     under control with meds., has been on med. > 10 yr.  . Degenerative arthritis of shoulder region 08/2013    left  . Disorder of bursae and tendons in left shoulder region 08/2013  . Hyperlipidemia     Past Surgical History  Procedure Laterality Date  . Cholecystectomy  08/29/2012    Procedure: LAPAROSCOPIC CHOLECYSTECTOMY WITH INTRAOPERATIVE CHOLANGIOGRAM;  Surgeon: Imogene Burn. Tsuei, MD;  Location: [facility];  Service: General;  Laterality: N/A;  . Transurethral resection of bladder tumor with mitomycin-c  08/24/2008  . Shoulder arthroscopy with subacromial decompression, rotator cuff repair and bicep tendon repair Left 09/03/2013    Procedure: LEFT SHOULDER ARTHROSCOPY WITH EXTENSIVE DEBRIDMENT, DISTAL CLAVICULECTOMY, ROTATOR CUFF REPAIR AND SUBACROMIAL DECOMPRESSION PARTIAL ACRIOMIOPLASTY WITH CORACOACROMIAL RELEASE;  Surgeon: Renette Butters, MD;  Location: [facility];  Service: Orthopedics;  Laterality: Left;    Family History  Problem Relation Age of Onset  . Diabetes Mother   . Cancer Father     Social History:  reports that he quit smoking about 18 years ago. He has never used smokeless tobacco. He reports that he does not drink alcohol or use illicit drugs.    Review of Systems    he is asking for treatment for restless legs   Most recent eye exam was in 12/15.  Was told that he had no retinopathy       Lipids: he is taking Lipitor 20 mg, last triglycerides and LDL are normal       Lab Results  Component Value Date   CHOL 108 03/28/2016   HDL 28.80* 03/28/2016   LDLCALC 54 03/28/2016   TRIG 130.0 03/28/2016   CHOLHDL 4 03/28/2016                  The blood pressure has been treated with amlodipine and lisinopril HCT and also lisinopril. Home reading recently 130/70 Occasionally may feel lightheaded. Blood pressure also followed by nephrologist  He has been followed by nephrologist for diabetic nephropathy.   His  Previous  microalbumin/creatinine ratio was 393 and in 4/15 his 24-hour urine protein was 538  Recently his urine microalbumin ratio is normal  His creatinine is slightly better     Lab Results  Component Value Date   CREATININE 1.46 03/28/2016   BUN 19 03/28/2016   NA 136 03/28/2016   K 4.3 03/28/2016   CL 103 03/28/2016   CO2 27 03/28/2016      Physical Examination:  BP 114/56 mmHg  Pulse 41  Ht '5\' 7"'$  (1.702 m)  Wt 191 lb (86.637 kg)  BMI 29.91 kg/m2  SpO2 96%     HR 60 with ectopics Standing blood pressure 122/64  ASSESSMENT:  Diabetes type 2, nonobese  See history of present illness for detailed discussion of his current management, blood sugar patterns and problems identified He continues to have a good control on a regimen of Toujeo, Victoza and mealtime insulin He has fairly stable blood sugars throughout the day He did have hyperglycemia with prednisone for his gout  Has been able to maintain his weight also with continuing Victoza. He is complaining about the cost of his multiple medications and his insurance is not able to give him a better tier on various products  HYPERTENSION:  Blood pressure is well-controlled without orthostasis, To continue same regimen  RENAL dysfunction/nephropathy: His creatinine is stable   RESTLESS leg  syndrome controlled with Requip  Ectopics on cardiac exam: Defer to PCP, explained to patient that this is causing falsely low pulse rate  PLAN:    No change in treatment regimen, however he can try to see if Levemir would be less expensive than Toujeo  Also given coupon for free box of Toujeo pens  He will try to avoid steroids as possible  He will need to adjust his Novolog based on his planned activity level and avoid hypoglycemia related to increased activity especially in the afternoon or when going out to play golf  Follow-up in 3 months again   Patient Instructions  Check blood sugars on waking up  4 times a week Also  check blood sugars about 2 hours after a meal and do this after different meals by rotation  Recommended blood sugar levels on waking up is 90-130 and about 2 hours after meal is 130-160  Please bring your blood sugar monitor to each visit, thank you      Counseling time on subjects discussed above is over 50% of today's 25 minute visit   Teofilo Lupinacci 04/01/2016, 8:41 AM   Note: This office note was prepared with Estate agent. Any transcriptional errors that result from this process are unintentional.

## 2016-04-03 ENCOUNTER — Other Ambulatory Visit: Payer: Self-pay | Admitting: Endocrinology

## 2016-04-09 ENCOUNTER — Other Ambulatory Visit: Payer: Self-pay | Admitting: Endocrinology

## 2016-04-10 ENCOUNTER — Other Ambulatory Visit: Payer: Self-pay

## 2016-04-10 MED ORDER — INSULIN GLARGINE 300 UNIT/ML ~~LOC~~ SOPN: 57.0000 [IU] | PEN_INJECTOR | Freq: Every day | SUBCUTANEOUS | Status: DC

## 2016-04-12 ENCOUNTER — Other Ambulatory Visit: Payer: Self-pay | Admitting: Endocrinology

## 2016-04-18 ENCOUNTER — Other Ambulatory Visit: Payer: Self-pay | Admitting: Endocrinology

## 2016-04-29 DIAGNOSIS — N2581 Secondary hyperparathyroidism of renal origin: Secondary | ICD-10-CM | POA: Diagnosis not present

## 2016-04-29 DIAGNOSIS — N189 Chronic kidney disease, unspecified: Secondary | ICD-10-CM | POA: Diagnosis not present

## 2016-04-29 DIAGNOSIS — N183 Chronic kidney disease, stage 3 (moderate): Secondary | ICD-10-CM | POA: Diagnosis not present

## 2016-04-29 DIAGNOSIS — R809 Proteinuria, unspecified: Secondary | ICD-10-CM | POA: Diagnosis not present

## 2016-05-20 DIAGNOSIS — M2550 Pain in unspecified joint: Secondary | ICD-10-CM | POA: Diagnosis not present

## 2016-05-20 DIAGNOSIS — M720 Palmar fascial fibromatosis [Dupuytren]: Secondary | ICD-10-CM | POA: Diagnosis not present

## 2016-05-20 DIAGNOSIS — M722 Plantar fascial fibromatosis: Secondary | ICD-10-CM | POA: Diagnosis not present

## 2016-06-06 DIAGNOSIS — I129 Hypertensive chronic kidney disease with stage 1 through stage 4 chronic kidney disease, or unspecified chronic kidney disease: Secondary | ICD-10-CM | POA: Diagnosis not present

## 2016-06-06 DIAGNOSIS — D631 Anemia in chronic kidney disease: Secondary | ICD-10-CM | POA: Diagnosis not present

## 2016-06-06 DIAGNOSIS — Z6831 Body mass index (BMI) 31.0-31.9, adult: Secondary | ICD-10-CM | POA: Diagnosis not present

## 2016-06-06 DIAGNOSIS — R809 Proteinuria, unspecified: Secondary | ICD-10-CM | POA: Diagnosis not present

## 2016-06-06 DIAGNOSIS — R972 Elevated prostate specific antigen [PSA]: Secondary | ICD-10-CM | POA: Diagnosis not present

## 2016-06-06 DIAGNOSIS — N183 Chronic kidney disease, stage 3 (moderate): Secondary | ICD-10-CM | POA: Diagnosis not present

## 2016-06-06 DIAGNOSIS — N2581 Secondary hyperparathyroidism of renal origin: Secondary | ICD-10-CM | POA: Diagnosis not present

## 2016-07-03 ENCOUNTER — Other Ambulatory Visit (INDEPENDENT_AMBULATORY_CARE_PROVIDER_SITE_OTHER): Payer: Medicare Other

## 2016-07-03 DIAGNOSIS — E1165 Type 2 diabetes mellitus with hyperglycemia: Secondary | ICD-10-CM | POA: Diagnosis not present

## 2016-07-03 DIAGNOSIS — Z794 Long term (current) use of insulin: Secondary | ICD-10-CM

## 2016-07-03 LAB — BASIC METABOLIC PANEL
BUN: 24 mg/dL — ABNORMAL HIGH (ref 6–23)
CO2: 26 mEq/L (ref 19–32)
Calcium: 9.5 mg/dL (ref 8.4–10.5)
Chloride: 104 mEq/L (ref 96–112)
Creatinine, Ser: 1.66 mg/dL — ABNORMAL HIGH (ref 0.40–1.50)
GFR: 44.01 mL/min — ABNORMAL LOW (ref >60.00)
Glucose, Bld: 168 mg/dL — ABNORMAL HIGH (ref 70–99)
Potassium: 4.3 mEq/L (ref 3.5–5.1)
Sodium: 138 mEq/L (ref 135–145)

## 2016-07-03 LAB — HEMOGLOBIN A1C: Hgb A1c MFr Bld: 7.1 % — ABNORMAL HIGH (ref 4.6–6.5)

## 2016-07-08 ENCOUNTER — Ambulatory Visit (INDEPENDENT_AMBULATORY_CARE_PROVIDER_SITE_OTHER): Payer: Medicare Other | Admitting: Endocrinology

## 2016-07-08 ENCOUNTER — Encounter: Payer: Self-pay | Admitting: Endocrinology

## 2016-07-08 VITALS — BP 110/60 | HR 64 | Temp 98.0°F | Resp 16 | Ht 67.0 in | Wt 191.4 lb

## 2016-07-08 DIAGNOSIS — I10 Essential (primary) hypertension: Secondary | ICD-10-CM | POA: Diagnosis not present

## 2016-07-08 DIAGNOSIS — E1165 Type 2 diabetes mellitus with hyperglycemia: Secondary | ICD-10-CM

## 2016-07-08 DIAGNOSIS — E1121 Type 2 diabetes mellitus with diabetic nephropathy: Secondary | ICD-10-CM | POA: Diagnosis not present

## 2016-07-08 DIAGNOSIS — Z23 Encounter for immunization: Secondary | ICD-10-CM | POA: Diagnosis not present

## 2016-07-08 DIAGNOSIS — Z794 Long term (current) use of insulin: Secondary | ICD-10-CM

## 2016-07-08 NOTE — Patient Instructions (Addendum)
Take lunchtime shot at mealtime  Cut amlodipine in 1./2

## 2016-07-08 NOTE — Progress Notes (Signed)
Patient ID: Alec [name], male   DOB: 09/15/1946, 70 y.o.   MRN: [id number]           Reason for Appointment:  Follow-up for Type 2 Diabetes  Referring physician:Shaw  History of Present Illness:          Diagnosis: Type 2 diabetes mellitus, date of diagnosis: 2001       Past history:  He was not symptomatic at time of diagnosis and was probably treated with Amaryl only.  No details are available of previous management However he thinks he was not given metformin at any time because of his "kidney problem" He had a A1c of over 9% in 2013 and may have been started on insulin at that time. Most likely has been taking Levemir and NovoLog since then A1c records show his results were over 8% in 2014 and in 4/15 but subsequently down to 7.1 in November 2015 Amaryl was stopped in 7/16  Recent history:    INSULIN regimen is described as: Toujeo 62 units hs. Novolog before meals 07-11-17 units     He was seen in consultation in 11/2014 when his A1c was 7.4; he was having high readings in the evenings He was switched to Levemir 20 units twice a day and subsequently has been on Toujeo instead of Levemir once a day at bedtime Since his blood sugars were averaging over 200 he was given Victoza in addition to his basal bolus insulin regimen in 6/16  His A1c had improved previously at 6.6 but now higher at 7.1  His blood sugars were downloaded today from the One Touch Verio monitor, current patterns, problems identified and management:  His blood sugar readings are again tending to be variable including in the morning  He has gone on LEVEMIR since his last visit because of the cost of Toujeo being excessive  Taking about 5 units more of the Levemir compared to [facility]  He says that sometimes he will forget to take the lunchtime dose before eating and may take it an hour later  He does have a few readings over 200 after breakfast or lunch  However not checking readings after evening meal  recently  Weight is about the same  He thinks he can do better with his diet and not keen on seeing the dietitian again for this  He is  taking VICTOZA without side effects and his weight has been unchanged  Oral hypoglycemic drugs the patient is taking are: None Side effects from medications have been:none  Compliance with the medical regimen: Good  Hypoglycemia: None  Glucose monitoring:  done 2-3 times a day Glucometer:  One Touch       Blood Glucose readings by time of day with most blood sugars being checked in the morning  Mean values apply above for all meters except median for One Touch  PRE-MEAL Fasting Lunch Dinner Bedtime Overall  Glucose range: 100-172  113-209  87-210     Mean/median: 136   148  136   Self-care: The diet that the patient has been following is: Relatively low fat    Meals: 3 meals per day. Lunch  1 pm Dinner 6 pm Breakfast is usually an egg sandwich/pop tart   Sometimes eating fast food at lunch Eating a balanced meals in the evening.  He will have snacks on granola bars              Exercise:  Walking, tries to walk when playing golf  Dietician visit, most recent: 6/15.               Weight history:  Wt Readings from Last 3 Encounters:  07/08/16 191 lb 6.4 oz (86.8 kg)  04/01/16 191 lb (86.6 kg)  01/01/16 190 lb 6.4 oz (86.4 kg)    Glycemic control:    Lab Results  Component Value Date   HGBA1C 7.1 (H) 07/03/2016   HGBA1C 6.6 (H) 03/28/2016   HGBA1C 6.8 (H) 12/21/2015   Lab Results  Component Value Date   MICROALBUR 15.2 (H) 06/23/2015   LDLCALC 54 03/28/2016   CREATININE 1.66 (H) 07/03/2016         Medication List       Accurate as of 07/08/16  8:15 AM. Always use your most recent med list.          ACCU-CHEK SOFTCLIX LANCETS lancets Use as instructed to check blood sugar 5-6 times per day dx code C94.7   ONETOUCH DELICA LANCETS 09G Misc USE TO CHECK BLOOD SUGAR 5-6 TIMES DAILY   amLODipine 10 MG tablet Commonly  known as:  NORVASC Take 10 mg by mouth daily.   aspirin 81 MG tablet Take 81 mg by mouth daily.   atorvastatin 20 MG tablet Commonly known as:  LIPITOR Take 20 mg by mouth daily.   Cinnamon 500 MG Tabs Take 1,000 mg by mouth daily.   fish oil-omega-3 fatty acids 1000 MG capsule Take 2 g by mouth daily.   glucose blood test strip Commonly known as:  ONETOUCH VERIO Use as instructed to check blood sugar 6 times per day dx code E11.9   insulin aspart 100 UNIT/ML injection Commonly known as:  NOVOLOG Inject 10 units in the morning, 12 units at lunch and 18 units at dinner   NOVOLOG 100 UNIT/ML injection Generic drug:  insulin aspart INJECT 10 UNITS SUBCUTANEOUSLY IN THE MORNING AND 12 UNITS AT BEDTIME   Insulin Detemir 100 UNIT/ML Pen Commonly known as:  LEVEMIR Inject 60 Units into the skin daily at 10 pm.   lisinopril 20 MG tablet Commonly known as:  PRINIVIL,ZESTRIL Take 20 mg by mouth daily.   lisinopril-hydrochlorothiazide 20-12.5 MG tablet Commonly known as:  PRINZIDE,ZESTORETIC   multivitamin tablet Take 1 tablet by mouth daily.   [place] w/Device Kit Use to check blood sugar 2 times per day dx code E11.21   rOPINIRole 0.5 MG tablet Commonly known as:  REQUIP TAKE ONE TABLET BY MOUTH AT BEDTIME   sertraline 100 MG tablet Commonly known as:  ZOLOFT Take 150 mg by mouth daily.   VICTOZA 18 MG/3ML Sopn Generic drug:  Liraglutide INJECT 1.2 MG SUBCUTANEOUSLY ONCE DAILY AT THE SAME TIME   VICTOZA 18 MG/3ML Sopn Generic drug:  Liraglutide INJECT 1.2MG SUBCTANEOUSLY ONCE DAILY AT THE SAME TIME   VICTOZA 18 MG/3ML Sopn Generic drug:  Liraglutide INJECT 1.2MG SUBCTANEOUSLY ONCE DAILY AT THE SAME TIME   Vitamin D3 3000 units Tabs Take 1 tablet by mouth daily.       Allergies: No Known Allergies  Past Medical History:  Diagnosis Date  . Degenerative arthritis of shoulder region 08/2013   left  . Disorder of bursae and tendons in  left shoulder region 08/2013  . Hyperlipidemia   . Hypertension    under control with meds., has been on med. > 10 yr.  . IDDM (insulin dependent diabetes mellitus) ([place])     Past Surgical History:  Procedure Laterality Date  . CHOLECYSTECTOMY  08/29/2012   Procedure: LAPAROSCOPIC CHOLECYSTECTOMY WITH INTRAOPERATIVE CHOLANGIOGRAM;  Surgeon: Imogene Burn. Georgette Dover, MD;  Location: [facility];  Service: General;  Laterality: N/A;  . SHOULDER ARTHROSCOPY WITH SUBACROMIAL DECOMPRESSION, ROTATOR CUFF REPAIR AND BICEP TENDON REPAIR Left 09/03/2013   Procedure: LEFT SHOULDER ARTHROSCOPY WITH EXTENSIVE DEBRIDMENT, DISTAL CLAVICULECTOMY, ROTATOR CUFF REPAIR AND SUBACROMIAL DECOMPRESSION PARTIAL ACRIOMIOPLASTY WITH CORACOACROMIAL RELEASE;  Surgeon: Renette Butters, MD;  Location: [place];  Service: Orthopedics;  Laterality: Left;  . TRANSURETHRAL RESECTION OF BLADDER TUMOR WITH MITOMYCIN-C  08/24/2008    Family History  Problem Relation Age of Onset  . Diabetes Mother   . Cancer Father     Social History:  reports that he quit smoking about 18 years ago. He has never used smokeless tobacco. He reports that he does not drink alcohol or use drugs.    Review of Systems    Most recent eye exam was in 12/15.  Was told that he had no retinopathy       Lipids: he is taking Lipitor 20 mg, last triglycerides and LDL are normal       Lab Results  Component Value Date   CHOL 108 03/28/2016   HDL 28.80 (L) 03/28/2016   LDLCALC 54 03/28/2016   TRIG 130.0 03/28/2016   CHOLHDL 4 03/28/2016                  The blood pressure has been treated with amlodipine and lisinopril HCT and also lisinopril.  Home reading recently 117/70 but his BP was low normal with his PCP also last month . Blood pressure also followed by nephrologist  He has been followed by nephrologist for diabetic nephropathy.   Previous microalbumin/creatinine ratio was 393 and in 4/15 his 24-hour urine protein was 538   Recently his urine microalbumin ratio is slightly increased  His creatinine is overall stable but relatively higher this time   Lab Results  Component Value Date   CREATININE 1.66 (H) 07/03/2016   BUN 24 (H) 07/03/2016   NA 138 07/03/2016   K 4.3 07/03/2016   CL 104 07/03/2016   CO2 26 07/03/2016      Physical Examination:  BP 110/60   Pulse 64   Temp 98 F (36.7 C)   Resp 16   Ht _0  (1.702 m)   Wt 191 lb 6.4 oz (86.8 kg)   SpO2 97%   BMI 29.98 kg/m         ASSESSMENT:  Diabetes type 2, nonobese  See history of present illness for detailed discussion of his current management, blood sugar patterns and problems identified  His blood sugars are more variable during the day but not clear this is from inconsistent compliance with his NovoLog or diet Fasting readings are overall fairly good on an average and even with taking Levemir compared to Toujeo he does not appear to have higher readings at suppertime  Has been able to maintain his weight also with continuing Victoza.  HYPERTENSION:  Blood pressure is low normal  RENAL dysfunction/nephropathy: His creatinine is somewhat variable, GFR may be lower from lower blood pressure   PLAN:     No change in treatment regimen, however he can try to take his NovoLog at lunch more consistently before eating  He will look into the V-go pump and see if his cost is affordable rate  Discussed in detail the use of the V-go pump, how the pump would work for him and day-to-day management with  this and he is interested in trying this  Recommended consultation with dietitian  No change in insulin doses as yet  More consistent exercise  Influenza vaccine given  For his hypertension he can try taking half a tablet of amlodipine as blood pressure appears to be consistently low now   Patient Instructions  Take lunchtime shot at mealtime    Counseling time on subjects discussed above is over 50% of today's 25 minute  visit   Alec [name] 07/08/2016, 8:15 AM   Note: This office note was prepared with Dragon voice recognition system technology. Any transcriptional errors that result from this process are unintentional.

## 2016-07-10 ENCOUNTER — Telehealth: Payer: Self-pay | Admitting: Endocrinology

## 2016-07-10 NOTE — Telephone Encounter (Signed)
Patient is calling on the status of a form that was faxed over by [facility] see if your ecieved it. Please advise

## 2016-07-11 ENCOUNTER — Other Ambulatory Visit: Payer: Self-pay | Admitting: Endocrinology

## 2016-07-15 ENCOUNTER — Other Ambulatory Visit: Payer: Self-pay | Admitting: *Deleted

## 2016-07-20 ENCOUNTER — Other Ambulatory Visit: Payer: Self-pay | Admitting: Endocrinology

## 2016-07-22 ENCOUNTER — Other Ambulatory Visit: Payer: Self-pay | Admitting: *Deleted

## 2016-07-22 MED ORDER — V-GO 40 KIT: PACK | 3 refills | Status: DC

## 2016-07-22 MED ORDER — INSULIN ASPART 100 UNIT/ML ~~LOC~~ SOLN: SUBCUTANEOUS | 3 refills | Status: DC

## 2016-07-26 ENCOUNTER — Encounter: Payer: Self-pay | Admitting: Endocrinology

## 2016-07-26 ENCOUNTER — Ambulatory Visit (INDEPENDENT_AMBULATORY_CARE_PROVIDER_SITE_OTHER): Payer: Medicare Other | Admitting: Endocrinology

## 2016-07-26 VITALS — BP 138/78 | HR 62 | Ht 67.0 in | Wt 191.0 lb

## 2016-07-26 DIAGNOSIS — Z794 Long term (current) use of insulin: Secondary | ICD-10-CM | POA: Diagnosis not present

## 2016-07-26 DIAGNOSIS — R972 Elevated prostate specific antigen [PSA]: Secondary | ICD-10-CM | POA: Diagnosis not present

## 2016-07-26 DIAGNOSIS — Z8551 Personal history of malignant neoplasm of bladder: Secondary | ICD-10-CM | POA: Diagnosis not present

## 2016-07-26 DIAGNOSIS — E1165 Type 2 diabetes mellitus with hyperglycemia: Secondary | ICD-10-CM

## 2016-07-26 NOTE — Progress Notes (Addendum)
Patient ID: Alec [name], male   DOB: November 21, 1945, 70 y.o.   MRN: [id number]           Reason for Appointment:  Follow-up for Type 2 Diabetes  Referring physician:Shaw  History of Present Illness:          Diagnosis: Type 2 diabetes mellitus, date of diagnosis: 2001        Recent history:    INSULIN regimen is described as:  V-go basal 40 units, mealtime clicks: 0-2-5 Previous insulin regimen: Levemir 67 units hs. Novolog before meals 07-11-17 units     He was seen in consultation in 11/2014 when his A1c was 7.4 Since his blood sugars were averaging over 200 he was given Victoza in addition to his basal bolus insulin regimen in 6/16  His A1c last A1c was 7.1, higher than the previous 6.6  His blood sugars were not available for download today.  Current blood sugar patterns and problems:  He started on the V go pump 3 days ago and is applying it on his upper abdomen without difficulty.  He has been able to bolus without difficulty with each meal.  He appears to be having less fluctuation of his blood sugars compared to previous readings  By recall his fasting blood sugars have ranged between 123-140  Lunchtime 103-100 and suppertime 67-105.  His sugar got low only once after being active outside before suppertime.  However he thinks he may feel like sugar might be getting low in the mid afternoon and will have a snack  Has one reading of 109 at bedtime, does not remember other readings and did not check last night  He is  taking VICTOZA 1.2 mg as before  Oral hypoglycemic drugs the patient is taking are: None Side effects from medications have been:none  Compliance with the medical regimen: Good  Hypoglycemia: None  Glucose monitoring:  done 2-3 times a day Glucometer:  One Touch       Blood Glucose readings by recall as above  Self-care: The diet that the patient has been following is: Relatively low fat    Meals: 3 meals per day. Lunch  1 pm Dinner 6 pm  Breakfast is usually an egg sandwich/pop tart   Sometimes eating fast food at lunch Eating a balanced meals in the evening.  He will have snacks on granola bars              Exercise:  Walking, tries to walk when playing golf  Dietician visit, most recent: 6/15.               Weight history:  Wt Readings from Last 3 Encounters:  07/26/16 191 lb (86.6 kg)  07/08/16 191 lb 6.4 oz (86.8 kg)  04/01/16 191 lb (86.6 kg)    Glycemic control:    Lab Results  Component Value Date   HGBA1C 7.1 (H) 07/03/2016   HGBA1C 6.6 (H) 03/28/2016   HGBA1C 6.8 (H) 12/21/2015   Lab Results  Component Value Date   MICROALBUR 15.2 (H) 06/23/2015   LDLCALC 54 03/28/2016   CREATININE 1.66 (H) 07/03/2016     Past history:  He was not symptomatic at time of diagnosis and was probably treated with Amaryl only.  No details are available of previous management However he thinks he was not given metformin at any time because of his "kidney problem" He had a A1c of over 9% in 2013 and may have been started on insulin at that time.  Most likely has been taking Levemir and NovoLog since then A1c records show his results were over 8% in 2014 and in 4/15 but subsequently down to 7.1 in November 2015 Amaryl was stopped in 7/16  Other active problems are addressed in Review of systems     Medication List       Accurate as of 07/26/16  3:50 PM. Always use your most recent med list.          ACCU-CHEK SOFTCLIX LANCETS lancets Use as instructed to check blood sugar 5-6 times per day dx code D66.4   ONETOUCH DELICA LANCETS 40H Misc USE TO CHECK BLOOD SUGAR 5-6 TIMES DAILY   amLODipine 10 MG tablet Commonly known as:  NORVASC Take 10 mg by mouth daily.   aspirin 81 MG tablet Take 81 mg by mouth daily.   atorvastatin 20 MG tablet Commonly known as:  LIPITOR Take 20 mg by mouth daily.   Cinnamon 500 MG Tabs Take 1,000 mg by mouth daily.   fish oil-omega-3 fatty acids 1000 MG capsule Take 2 g  by mouth daily.   glucose blood test strip Commonly known as:  ONETOUCH VERIO Use as instructed to check blood sugar 6 times per day dx code E11.9   insulin aspart 100 UNIT/ML injection Commonly known as:  NOVOLOG Use max 78 units per day with V-go pump   Insulin Detemir 100 UNIT/ML Pen Commonly known as:  LEVEMIR Inject 60 Units into the skin daily at 10 pm.   lisinopril 20 MG tablet Commonly known as:  PRINIVIL,ZESTRIL Take 20 mg by mouth daily.   lisinopril-hydrochlorothiazide 20-12.5 MG tablet Commonly known as:  PRINZIDE,ZESTORETIC   multivitamin tablet Take 1 tablet by mouth daily.   [place] w/Device Kit Use to check blood sugar 2 times per day dx code E11.21   rOPINIRole 0.5 MG tablet Commonly known as:  REQUIP TAKE ONE TABLET BY MOUTH AT BEDTIME   sertraline 100 MG tablet Commonly known as:  ZOLOFT Take 150 mg by mouth daily.   TOUJEO SOLOSTAR 300 UNIT/ML Sopn Generic drug:  Insulin Glargine INJECT 57 UNITS SUBCUTANEOUSLY ONCE DAILY   V-GO 40 Kit Use one per day   VICTOZA 18 MG/3ML Sopn Generic drug:  liraglutide INJECT 1.2 MG SUBCUTANEOUSLY ONCE DAILY AT THE SAME TIME   VICTOZA 18 MG/3ML Sopn Generic drug:  liraglutide INJECT 1.2MG SUBCTANEOUSLY ONCE DAILY AT THE SAME TIME   VICTOZA 18 MG/3ML Sopn Generic drug:  liraglutide INJECT 1.2MG  SUBCUTANEOUSLY ONCE DAILY AT THE SAME TIME   Vitamin D3 3000 units Tabs Take 1 tablet by mouth daily.       Allergies: No Known Allergies  Past Medical History:  Diagnosis Date  . Degenerative arthritis of shoulder region 08/2013   left  . Disorder of bursae and tendons in left shoulder region 08/2013  . Hyperlipidemia   . Hypertension    under control with meds., has been on med. > 10 yr.  . IDDM (insulin dependent diabetes mellitus) ([place])     Past Surgical History:  Procedure Laterality Date  . CHOLECYSTECTOMY  08/29/2012   Procedure: LAPAROSCOPIC CHOLECYSTECTOMY WITH  INTRAOPERATIVE CHOLANGIOGRAM;  Surgeon: Imogene Burn. Georgette Dover, MD;  Location: [facility];  Service: General;  Laterality: N/A;  . SHOULDER ARTHROSCOPY WITH SUBACROMIAL DECOMPRESSION, ROTATOR CUFF REPAIR AND BICEP TENDON REPAIR Left 09/03/2013   Procedure: LEFT SHOULDER ARTHROSCOPY WITH EXTENSIVE DEBRIDMENT, DISTAL CLAVICULECTOMY, ROTATOR CUFF REPAIR AND SUBACROMIAL DECOMPRESSION PARTIAL ACRIOMIOPLASTY WITH CORACOACROMIAL RELEASE;  Surgeon: Christia Reading  Maryla Morrow, MD;  Location: [facility];  Service: Orthopedics;  Laterality: Left;  . TRANSURETHRAL RESECTION OF BLADDER TUMOR WITH MITOMYCIN-C  08/24/2008    Family History  Problem Relation Age of Onset  . Diabetes Mother   . Cancer Father     Social History:  reports that he quit smoking about 18 years ago. He has never used smokeless tobacco. He reports that he does not drink alcohol or use drugs.    Review of Systems     HYPERTENSION: has been treated with amlodipine and lisinopril HCT and also lisinopril.  Home reading recently 130-150 Blood pressure also followed by nephrologist  Most recent eye exam was in 12/15.  Was told that he had no retinopathy       Lipids: he is taking Lipitor 20 mg, last triglycerides and LDL are normal       Lab Results  Component Value Date   CHOL 108 03/28/2016   HDL 28.80 (L) 03/28/2016   LDLCALC 54 03/28/2016   TRIG 130.0 03/28/2016   CHOLHDL 4 03/28/2016               CKD: He has been followed by nephrologist for diabetic nephropathy.   Previous microalbumin/creatinine ratio was 393 and in 4/15 his 24-hour urine protein was 538 Last urine microalbumin ratio is slightly increased His creatinine is overall stable    Lab Results  Component Value Date   CREATININE 1.66 (H) 07/03/2016   BUN 24 (H) 07/03/2016   NA 138 07/03/2016   K 4.3 07/03/2016   CL 104 07/03/2016   CO2 26 07/03/2016      Physical Examination:  BP 138/78   Pulse 62   Ht _0  (1.702 m)   Wt 191 lb (86.6 kg)    SpO2 96%   BMI 29.91 kg/m       Exam not indicated  ASSESSMENT:  Diabetes type 2, nonobese  See history of present illness for detailed discussion of his current management, blood sugar patterns and problems identified  With starting the V-go pump he thinks his blood sugars are fairly consistently near normal and highest only today 140 with eating late supper Does not have higher postprandial readings by recall Has only one relatively low blood sugar in the evening before supper because of increased physical activity Overall appears to have less fluctuation in his blood sugars and using less insulin especially basal with the pump He may be getting tendency to hypoglycemia after lunchtime bolus only  Continues on Victoza  HYPERTENSION:  Blood pressure is  normal   PLAN:    Continue V-go pump.  Reduce lunchtime bolus to 5 clicks instead of 6; however needs to a snack if he is planning to be more active in the evenings before supper  Continue half tablet of Norvasc unless blood pressure readings are ently high   Patient Instructions  Reduce lunch dose to 5 clicks  Stay on Victoza  Juice or snack when very active        Nabiha Planck 07/26/2016, 3:50 PM   Note: This office note was prepared with Dragon voice recognition system technology. Any transcriptional errors that result from this process are unintentional.

## 2016-07-26 NOTE — Patient Instructions (Addendum)
Reduce lunch dose to 5 clicks  Stay on Victoza  Juice or snack when very active

## 2016-08-05 DIAGNOSIS — C61 Malignant neoplasm of prostate: Secondary | ICD-10-CM | POA: Diagnosis not present

## 2016-08-05 DIAGNOSIS — R972 Elevated prostate specific antigen [PSA]: Secondary | ICD-10-CM | POA: Diagnosis not present

## 2016-08-12 DIAGNOSIS — C61 Malignant neoplasm of prostate: Secondary | ICD-10-CM | POA: Diagnosis not present

## 2016-09-02 DIAGNOSIS — C61 Malignant neoplasm of prostate: Secondary | ICD-10-CM | POA: Diagnosis not present

## 2016-09-27 ENCOUNTER — Other Ambulatory Visit (INDEPENDENT_AMBULATORY_CARE_PROVIDER_SITE_OTHER): Payer: Medicare Other

## 2016-09-27 DIAGNOSIS — Z794 Long term (current) use of insulin: Secondary | ICD-10-CM | POA: Diagnosis not present

## 2016-09-27 DIAGNOSIS — E1165 Type 2 diabetes mellitus with hyperglycemia: Secondary | ICD-10-CM | POA: Diagnosis not present

## 2016-09-27 LAB — COMPREHENSIVE METABOLIC PANEL
ALT: 24 U/L (ref 0–53)
AST: 18 U/L (ref 0–37)
Albumin: 4.2 g/dL (ref 3.5–5.2)
Alkaline Phosphatase: 63 U/L (ref 39–117)
BUN: 25 mg/dL — ABNORMAL HIGH (ref 6–23)
CO2: 27 mEq/L (ref 19–32)
Calcium: 9.7 mg/dL (ref 8.4–10.5)
Chloride: 104 mEq/L (ref 96–112)
Creatinine, Ser: 1.74 mg/dL — ABNORMAL HIGH (ref 0.40–1.50)
GFR: 41.65 mL/min — ABNORMAL LOW (ref >60.00)
Glucose, Bld: 116 mg/dL — ABNORMAL HIGH (ref 70–99)
Potassium: 4 mEq/L (ref 3.5–5.1)
Sodium: 138 mEq/L (ref 135–145)
Total Bilirubin: 0.8 mg/dL (ref 0.2–1.2)
Total Protein: 6.7 g/dL (ref 6.0–8.3)

## 2016-09-27 LAB — MICROALBUMIN / CREATININE URINE RATIO
Creatinine,U: 146.1 mg/dL
Microalb Creat Ratio: 8.4 mg/g (ref 0.0–30.0)
Microalb, Ur: 12.3 mg/dL — ABNORMAL HIGH (ref 0.0–1.9)

## 2016-09-27 LAB — HEMOGLOBIN A1C: Hgb A1c MFr Bld: 7 % — ABNORMAL HIGH (ref 4.6–6.5)

## 2016-09-30 HISTORY — PX: SHOULDER ARTHROSCOPY W/ ROTATOR CUFF REPAIR: SHX2400

## 2016-10-01 ENCOUNTER — Encounter: Payer: Self-pay | Admitting: Endocrinology

## 2016-10-01 ENCOUNTER — Ambulatory Visit (INDEPENDENT_AMBULATORY_CARE_PROVIDER_SITE_OTHER): Payer: Medicare Other | Admitting: Endocrinology

## 2016-10-01 VITALS — BP 131/67 | HR 61 | Ht 67.72 in | Wt 198.6 lb

## 2016-10-01 DIAGNOSIS — E1165 Type 2 diabetes mellitus with hyperglycemia: Secondary | ICD-10-CM

## 2016-10-01 DIAGNOSIS — Z794 Long term (current) use of insulin: Secondary | ICD-10-CM

## 2016-10-01 DIAGNOSIS — N183 Chronic kidney disease, stage 3 unspecified: Secondary | ICD-10-CM

## 2016-10-01 DIAGNOSIS — I10 Essential (primary) hypertension: Secondary | ICD-10-CM | POA: Diagnosis not present

## 2016-10-01 NOTE — Patient Instructions (Addendum)
Do only 5 clicks at lunch

## 2016-10-01 NOTE — Progress Notes (Signed)
Patient ID: Alec [name], male   DOB: 07-28-1946, 71 y.o.   MRN: [id number]           Reason for Appointment:  Follow-up for Type 2 Diabetes  Referring physician:Shaw  History of Present Illness:          Diagnosis: Type 2 diabetes mellitus, date of diagnosis: 2001        Recent history:    INSULIN regimen is described as:  V-go basal 40 units, mealtime clicks: 6-7-1  He was seen in consultation in 11/2014 when his A1c was 7.4 Since his blood sugars were averaging over 200 he was given Victoza in addition to his basal bolus insulin regimen in 6/16 He started on the V go pump in October  His A1c last A1c was 7.0, previously 7.1  Current blood sugar patterns and problems:  He says he was trying to do the bolus clicks for his meals through his shirt and apparently did not get his pump to work properly and blood sugars were very erratic last month  More recently has been trying to do the clicks directly on the pump as instructed before and he thinks this is helping his sugars come down  Also may not have been consistent with his diet last month  Although his fasting readings have been somewhat variable they are fairly consistently between about 111-135 recently  He only tends to get occasional blood sugars around 69 before suppertime or midafternoon  He did not reduce his bolus dosage at lunchtime to 10 units and is still taking 12 units as before  Also not checking blood sugars after supper and not clear what his postprandial readings are, these appear to be variable and at least once may not have had his bolus delivered with a glucose of 332 PC  He is not as active, does only a little activity at work but his weight is stable  He is  taking VICTOZA 1.2 mg as before  Oral hypoglycemic drugs the patient is taking are: None Side effects from medications have been:none  Compliance with the medical regimen: Good  Hypoglycemia: None  Glucose monitoring:  done overall at  least 3 times a day Glucometer:  One Touch       Blood Glucose readings by download:  Mean values apply above for all meters except median for One Touch  PRE-MEAL Fasting Lunch Dinner Bedtime Overall  Glucose range: 98-251  73-341  69-273  73-332    Mean/median: 160  137  139   150    Self-care: The diet that the patient has been following is: Relatively low fat     Meals: 3 meals per day. Lunch  1 pm Dinner 6 pm Breakfast is usually an egg sandwich  Sometimes eating fast food at lunch, Now mostly taking  a sandwich from home Eating a balanced meals in the evening.  He will have snacks on granola bars              Exercise:  Walking, tries to walk when he can  Dietician visit, most recent: 6/15.               Weight history:  Wt Readings from Last 3 Encounters:  10/01/16 198 lb 9.6 oz (90.1 kg)  07/26/16 191 lb (86.6 kg)  07/08/16 191 lb 6.4 oz (86.8 kg)    Glycemic control:    Lab Results  Component Value Date   HGBA1C 7.0 (H) 09/27/2016   HGBA1C 7.1 (  H) 07/03/2016   HGBA1C 6.6 (H) 03/28/2016   Lab Results  Component Value Date   MICROALBUR 12.3 (H) 09/27/2016   LDLCALC 54 03/28/2016   CREATININE 1.74 (H) 09/27/2016     Past history:  He was not symptomatic at time of diagnosis and was probably treated with Amaryl only.  No details are available of previous management However he thinks he was not given metformin at any time because of his "kidney problem" He had a A1c of over 9% in 2013 and may have been started on insulin at that time. Most likely has been taking Levemir and NovoLog since then A1c records show his results were over 8% in 2014 and in 4/15 but subsequently down to 7.1 in November 2015 Amaryl was stopped in 7/16  Other active problems are addressed in Review of systems   Allergies as of 10/01/2016   No Known Allergies     Medication List       Accurate as of 10/01/16  8:23 PM. Always use your most recent med list.          ACCU-CHEK  SOFTCLIX LANCETS lancets Use as instructed to check blood sugar 5-6 times per day dx code H08.6   ONETOUCH DELICA LANCETS 57Q Misc USE TO CHECK BLOOD SUGAR 5-6 TIMES DAILY   amLODipine 10 MG tablet Commonly known as:  NORVASC Take 10 mg by mouth daily.   aspirin 81 MG tablet Take 81 mg by mouth daily.   atorvastatin 20 MG tablet Commonly known as:  LIPITOR Take 20 mg by mouth daily.   Cinnamon 500 MG Tabs Take 1,000 mg by mouth daily.   fish oil-omega-3 fatty acids 1000 MG capsule Take 2 g by mouth daily.   glucose blood test strip Commonly known as:  ONETOUCH VERIO Use as instructed to check blood sugar 6 times per day dx code E11.9   insulin aspart 100 UNIT/ML injection Commonly known as:  NOVOLOG Use max 78 units per day with V-go pump   lisinopril 20 MG tablet Commonly known as:  PRINIVIL,ZESTRIL Take 20 mg by mouth daily.   lisinopril-hydrochlorothiazide 20-12.5 MG tablet Commonly known as:  PRINZIDE,ZESTORETIC   multivitamin tablet Take 1 tablet by mouth daily.   [place] w/Device Kit Use to check blood sugar 2 times per day dx code E11.21   rOPINIRole 0.5 MG tablet Commonly known as:  REQUIP TAKE ONE TABLET BY MOUTH AT BEDTIME   sertraline 100 MG tablet Commonly known as:  ZOLOFT Take 150 mg by mouth daily.   V-GO 40 Kit Use one per day   VICTOZA 18 MG/3ML Sopn Generic drug:  liraglutide INJECT 1.2 MG SUBCUTANEOUSLY ONCE DAILY AT THE SAME TIME   VICTOZA 18 MG/3ML Sopn Generic drug:  liraglutide INJECT 1.2MG SUBCTANEOUSLY ONCE DAILY AT THE SAME TIME   VICTOZA 18 MG/3ML Sopn Generic drug:  liraglutide INJECT 1.2MG  SUBCUTANEOUSLY ONCE DAILY AT THE SAME TIME   Vitamin D3 3000 units Tabs Take 1 tablet by mouth daily.       Allergies: No Known Allergies  Past Medical History:  Diagnosis Date  . Degenerative arthritis of shoulder region 08/2013   left  . Disorder of bursae and tendons in left shoulder region 08/2013  .  Hyperlipidemia   . Hypertension    under control with meds., has been on med. > 10 yr.  . IDDM (insulin dependent diabetes mellitus) ([place])     Past Surgical History:  Procedure Laterality Date  .  CHOLECYSTECTOMY  08/29/2012   Procedure: LAPAROSCOPIC CHOLECYSTECTOMY WITH INTRAOPERATIVE CHOLANGIOGRAM;  Surgeon: Imogene Burn. Georgette Dover, MD;  Location: [facility];  Service: General;  Laterality: N/A;  . SHOULDER ARTHROSCOPY WITH SUBACROMIAL DECOMPRESSION, ROTATOR CUFF REPAIR AND BICEP TENDON REPAIR Left 09/03/2013   Procedure: LEFT SHOULDER ARTHROSCOPY WITH EXTENSIVE DEBRIDMENT, DISTAL CLAVICULECTOMY, ROTATOR CUFF REPAIR AND SUBACROMIAL DECOMPRESSION PARTIAL ACRIOMIOPLASTY WITH CORACOACROMIAL RELEASE;  Surgeon: Renette Butters, MD;  Location: [facility];  Service: Orthopedics;  Laterality: Left;  . TRANSURETHRAL RESECTION OF BLADDER TUMOR WITH MITOMYCIN-C  08/24/2008    Family History  Problem Relation Age of Onset  . Diabetes Mother   . Cancer Father     Social History:  reports that he quit smoking about 19 years ago. He has never used smokeless tobacco. He reports that he does not drink alcohol or use drugs.    Review of Systems     HYPERTENSION: has been treated with amlodipine A half of 10 mg, and lisinopril HCT and also lisinopril.  Home readings are being monitored Blood pressure also followed by nephrologist  Most recent eye exam was in 12/15.  Was told that he had no retinopathy       Lipids: he is taking Lipitor 20 mg, last triglycerides and LDL are normal       Lab Results  Component Value Date   CHOL 108 03/28/2016   HDL 28.80 (L) 03/28/2016   LDLCALC 54 03/28/2016   TRIG 130.0 03/28/2016   CHOLHDL 4 03/28/2016               CKD: He has been followed by nephrologist for diabetic nephropathy.   Previous microalbumin/creatinine ratio was 393 and in 4/15 his 24-hour urine protein was 538 Last urine microalbumin ratio is Back to normal His creatinine is overall  quite variable and relatively higher now He does not know when his next appointment is with nephrologist    Lab Results  Component Value Date   CREATININE 1.74 (H) 09/27/2016   BUN 25 (H) 09/27/2016   NA 138 09/27/2016   K 4.0 09/27/2016   CL 104 09/27/2016   CO2 27 09/27/2016    Last foot exam in 5/17   Physical Examination:  BP 131/67   Pulse 61   Ht 5' 7.72" (1.72 m)   Wt 198 lb 9.6 oz (90.1 kg)   SpO2 96%   BMI 30.45 kg/m       Exam not indicated  ASSESSMENT:  Diabetes type 2, nonobese  See history of present illness for detailed discussion of his current management, blood sugar patterns and problems identified  With starting the V-go pump he thinks his blood sugars are fairly consistently near normal and highest only today 140 with eating late supper Does not have higher postprandial readings by recall Has only one relatively low blood sugar in the evening before supper because of increased physical activity Overall appears to have less fluctuation in his blood sugars and using less insulin especially basal with the pump He may be getting tendency to hypoglycemia after lunchtime bolus only  Continues on Victoza 1.2 mg daily and will not change this at this time His weight is consistent and encouraged him to be more active for exercise  HYPERTENSION:  Blood pressure is  normal, is on 5 mg Norvasc using half of the 10 mg tablet, also followed by PCP   PLAN:    Continue V-go pump with 40 unit basal.  Reduce lunchtime bolus to 5  clicks instead of 6; may need to adjust further based on his readings in the afternoons, discussed needing to avoid hypoglycemia and duration of action of each bolus.  Avoid trying to do a bolus with his clothing on  Reminded him to bolus at least a few minutes before eating consistently at each meal and may have another click for a large carbohydrate snack  When he starts getting more activity may need to have a snack for increased  activity especially in the afternoon  He will adjust his suppertime clicks based on his meal size and carbohydrate intake and he needs to do more readings after supper to help guide doses.  He will check with the V-go company for any technical problems and to see if prior authorization needs to be done again this year   Patient Instructions  Do only 5 clicks at lunch      Counseling time on subjects discussed above is over 50% of today's 25 minute visit    Nandini Bogdanski 10/01/2016, 8:23 PM   Note: This office note was prepared with Dragon voice recognition system technology. Any transcriptional errors that result from this process are unintentional.

## 2016-10-03 ENCOUNTER — Telehealth: Payer: Self-pay | Admitting: Endocrinology

## 2016-10-03 NOTE — Telephone Encounter (Signed)
He needs to find out from his insurance what alternatives they will cover

## 2016-10-03 NOTE — Telephone Encounter (Signed)
Pt called in very concerned because his insurance is wanting him to pay $400 for Victoza and he cannot afford that. He requests a call back today.

## 2016-10-03 NOTE — Telephone Encounter (Signed)
Dr. Please advise.

## 2016-10-04 NOTE — Telephone Encounter (Signed)
Called patient and asked him to contact his ins about what they will pay for in place of Victoza.  He said he will do that and let us know.

## 2016-10-09 ENCOUNTER — Other Ambulatory Visit: Payer: Self-pay | Admitting: Endocrinology

## 2016-10-10 ENCOUNTER — Telehealth: Payer: Self-pay | Admitting: Endocrinology

## 2016-10-10 NOTE — Telephone Encounter (Signed)
Refill submitted 10/07/2016.

## 2016-10-10 NOTE — Telephone Encounter (Signed)
Patient state he need a new prescription  For his  [place] test strip [facility] ([place]), [place] [phone] (Phone) [phone] (Fax)

## 2016-10-11 ENCOUNTER — Telehealth: Payer: Self-pay | Admitting: Family Medicine

## 2016-10-11 NOTE — Telephone Encounter (Signed)
Patient stated that [facility] on [address] is requesting a diagnostic code for his strips and lancets to be able to get them filled.  He also said that he is completely out of strips now.

## 2016-10-14 ENCOUNTER — Other Ambulatory Visit: Payer: Self-pay

## 2016-10-14 MED ORDER — ONETOUCH DELICA LANCETS 33G MISC: 3 refills | Status: DC

## 2016-10-14 MED ORDER — GLUCOSE BLOOD VI STRP: ORAL_STRIP | 5 refills | Status: DC

## 2016-10-14 NOTE — Telephone Encounter (Signed)
Patient need a dx code for the one touch verio test strips in order for patient to have strips filled .

## 2016-10-14 NOTE — Telephone Encounter (Signed)
Refill submitted with the diagnosis code.

## 2016-10-19 DIAGNOSIS — J40 Bronchitis, not specified as acute or chronic: Secondary | ICD-10-CM | POA: Diagnosis not present

## 2016-10-19 DIAGNOSIS — R05 Cough: Secondary | ICD-10-CM | POA: Diagnosis not present

## 2016-10-21 ENCOUNTER — Other Ambulatory Visit: Payer: Self-pay

## 2016-10-30 ENCOUNTER — Other Ambulatory Visit: Payer: Self-pay | Admitting: Endocrinology

## 2016-11-09 ENCOUNTER — Other Ambulatory Visit: Payer: Self-pay | Admitting: Endocrinology

## 2016-11-11 ENCOUNTER — Other Ambulatory Visit: Payer: Self-pay

## 2016-11-11 MED ORDER — ACCU-CHEK MULTICLIX LANCETS MISC: 5 refills | Status: DC

## 2016-11-11 MED ORDER — ACCU-CHEK AVIVA PLUS W/DEVICE KIT: PACK | 1 refills | Status: DC

## 2016-11-11 MED ORDER — GLUCOSE BLOOD VI STRP: ORAL_STRIP | 5 refills | Status: DC

## 2016-11-12 ENCOUNTER — Other Ambulatory Visit: Payer: Self-pay

## 2016-11-12 MED ORDER — GLUCOSE BLOOD VI STRP: ORAL_STRIP | 5 refills | Status: DC

## 2016-11-20 ENCOUNTER — Other Ambulatory Visit: Payer: Self-pay | Admitting: Endocrinology

## 2016-11-21 ENCOUNTER — Telehealth: Payer: Self-pay | Admitting: Endocrinology

## 2016-11-21 NOTE — Telephone Encounter (Signed)
[facility] called in to speak with someone ASAP about the VGo script, they are out of the VGo and won't have it in stock and the patient is completely out.  They were wondering if they could have the script changed or something could be done soon. Please advise and call [place] at [phone]

## 2016-11-22 NOTE — Telephone Encounter (Signed)
He will have to check if another drug store has it.  Otherwise he can come here and pick up a sample of the same strength

## 2016-11-25 ENCOUNTER — Other Ambulatory Visit: Payer: Self-pay

## 2016-11-25 MED ORDER — GLUCOSE BLOOD VI STRP: ORAL_STRIP | 5 refills | Status: DC

## 2016-11-25 NOTE — Telephone Encounter (Signed)
Left a voice mail for the patient making him aware that we have samples in the office  And requesting a call back from him

## 2016-11-28 DIAGNOSIS — H2513 Age-related nuclear cataract, bilateral: Secondary | ICD-10-CM | POA: Diagnosis not present

## 2016-11-28 DIAGNOSIS — E119 Type 2 diabetes mellitus without complications: Secondary | ICD-10-CM | POA: Diagnosis not present

## 2016-11-28 DIAGNOSIS — H5203 Hypermetropia, bilateral: Secondary | ICD-10-CM | POA: Diagnosis not present

## 2016-11-28 DIAGNOSIS — H35033 Hypertensive retinopathy, bilateral: Secondary | ICD-10-CM | POA: Diagnosis not present

## 2016-11-29 ENCOUNTER — Encounter: Payer: Self-pay | Admitting: Endocrinology

## 2016-11-29 DIAGNOSIS — R809 Proteinuria, unspecified: Secondary | ICD-10-CM | POA: Diagnosis not present

## 2016-11-29 DIAGNOSIS — N2581 Secondary hyperparathyroidism of renal origin: Secondary | ICD-10-CM | POA: Diagnosis not present

## 2016-11-29 DIAGNOSIS — N183 Chronic kidney disease, stage 3 (moderate): Secondary | ICD-10-CM | POA: Diagnosis not present

## 2016-12-06 DIAGNOSIS — D631 Anemia in chronic kidney disease: Secondary | ICD-10-CM | POA: Diagnosis not present

## 2016-12-06 DIAGNOSIS — N183 Chronic kidney disease, stage 3 (moderate): Secondary | ICD-10-CM | POA: Diagnosis not present

## 2016-12-06 DIAGNOSIS — I129 Hypertensive chronic kidney disease with stage 1 through stage 4 chronic kidney disease, or unspecified chronic kidney disease: Secondary | ICD-10-CM | POA: Diagnosis not present

## 2016-12-06 DIAGNOSIS — Z6831 Body mass index (BMI) 31.0-31.9, adult: Secondary | ICD-10-CM | POA: Diagnosis not present

## 2016-12-06 DIAGNOSIS — N2581 Secondary hyperparathyroidism of renal origin: Secondary | ICD-10-CM | POA: Diagnosis not present

## 2016-12-13 ENCOUNTER — Other Ambulatory Visit: Payer: Self-pay | Admitting: Endocrinology

## 2016-12-25 DIAGNOSIS — N183 Chronic kidney disease, stage 3 (moderate): Secondary | ICD-10-CM | POA: Diagnosis not present

## 2017-01-01 ENCOUNTER — Other Ambulatory Visit (INDEPENDENT_AMBULATORY_CARE_PROVIDER_SITE_OTHER): Payer: Medicare Other

## 2017-01-01 DIAGNOSIS — Z794 Long term (current) use of insulin: Secondary | ICD-10-CM

## 2017-01-01 DIAGNOSIS — E1165 Type 2 diabetes mellitus with hyperglycemia: Secondary | ICD-10-CM

## 2017-01-01 LAB — BASIC METABOLIC PANEL
BUN: 24 mg/dL — ABNORMAL HIGH (ref 6–23)
CO2: 24 mEq/L (ref 19–32)
Calcium: 9.6 mg/dL (ref 8.4–10.5)
Chloride: 105 mEq/L (ref 96–112)
Creatinine, Ser: 1.88 mg/dL — ABNORMAL HIGH (ref 0.40–1.50)
GFR: 38.07 mL/min — ABNORMAL LOW (ref >60.00)
Glucose, Bld: 146 mg/dL — ABNORMAL HIGH (ref 70–99)
Potassium: 4.3 mEq/L (ref 3.5–5.1)
Sodium: 137 mEq/L (ref 135–145)

## 2017-01-01 LAB — HEMOGLOBIN A1C: Hgb A1c MFr Bld: 7.3 % — ABNORMAL HIGH (ref 4.6–6.5)

## 2017-01-08 ENCOUNTER — Encounter: Payer: Self-pay | Admitting: Endocrinology

## 2017-01-08 ENCOUNTER — Ambulatory Visit (INDEPENDENT_AMBULATORY_CARE_PROVIDER_SITE_OTHER): Payer: Medicare Other | Admitting: Endocrinology

## 2017-01-08 VITALS — BP 136/64 | HR 60 | Ht 66.0 in | Wt 196.0 lb

## 2017-01-08 DIAGNOSIS — Z794 Long term (current) use of insulin: Secondary | ICD-10-CM

## 2017-01-08 DIAGNOSIS — E1121 Type 2 diabetes mellitus with diabetic nephropathy: Secondary | ICD-10-CM | POA: Diagnosis not present

## 2017-01-08 DIAGNOSIS — E1165 Type 2 diabetes mellitus with hyperglycemia: Secondary | ICD-10-CM | POA: Diagnosis not present

## 2017-01-08 NOTE — Progress Notes (Signed)
Patient ID: Alec [name], male   DOB: 1946-04-13, 71 y.o.   MRN: [id number]           Reason for Appointment:  Follow-up for Type 2 Diabetes  History of Present Illness:          Diagnosis: Type 2 diabetes mellitus, date of diagnosis: 2001        Recent history:    INSULIN regimen is described as:  V-go basal 40 units, mealtime clicks: 7-3-7  He was seen in consultation in 11/2014 when his A1c was 7.4 Since his blood sugars were averaging over 200 he was given Victoza in addition to his basal bolus insulin regimen in 6/16 He started on the V go pump in October 2016  His A1c is now 7.3, the last A1c was 7.0  Current blood sugar patterns and problems:  He still tends to have high FASTING readings but they're not consistent  Also may be getting higher AFTER evening meal but has only 3 readings recently, these are mostly high compared to the afternoon readings  Even with reducing the boluses at lunchtime his blood sugars are low normal in the afternoon frequently although does not usually feel hypoglycemic.  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  He is  taking VICTOZA 1.2 mg as before  Non-insulin hypoglycemic drugs the patient is taking are: Victoza 1.2 mg daily Side effects from medications have been:none  Compliance with the medical regimen: Good  Hypoglycemia: Minimal  Glucose monitoring:  done overall recently 2.3 times a day  Glucometer:  One Touch       Blood Glucose readings by download:  Mean values apply above for all meters except median  for One Touch  PRE-MEAL Fasting Lunch Dinner Bedtime Overall  Glucose range: 112-252       Mean/median: 157  120    152   POST-MEAL PC Breakfast PC Lunch PC Dinner  Glucose range:  1 12-296  78-1 66  180-202   Mean/median: 155 93  187    Self-care: The diet that the patient has been following is: Relatively low fat     Meals: 3 meals per day. Lunch  1 pm Dinner 6 pm Breakfast is usually an egg sandwich  Lunch: mostly taking  a sandwich from home Eating a balanced meals in the evening.  He will have snacks on granola bars              Exercise:  no programmed activity, doing some yardwork on weekends  Dietician visit, most recent: 6/15.               Weight history:  Wt Readings from Last 3 Encounters:  01/08/17 196 lb (88.9 kg)  10/01/16 198 lb 9.6 oz (90.1 kg)  07/26/16 191 lb (86.6 kg)    Glycemic control:    Lab Results  Component Value Date   HGBA1C 7.3 (H) 01/01/2017   HGBA1C 7.0 (H) 09/27/2016   HGBA1C 7.1 (H) 07/03/2016   Lab Results  Component Value Date   MICROALBUR  12.3 (H) 09/27/2016   LDLCALC 54 03/28/2016   CREATININE 1.88 (H) 01/01/2017     Past history:  He was not symptomatic at time of diagnosis and was probably treated with Amaryl only.  No details are available of previous management However he thinks he was not given metformin at any time because of his "kidney problem" He had a A1c of over 9% in 2013 and may have been started on insulin at that time. Most likely has been taking Levemir and NovoLog since then A1c records show his results were over 8% in 2014 and in 4/15 but subsequently down to 7.1 in November 2015 Amaryl was stopped in 7/16  Other active problems are addressed in Review of systems   Allergies as of 01/08/2017   No Known Allergies     Medication List       Accurate as of 01/08/17  8:25 AM. Always use your most recent med list.          ACCU-CHEK AVIVA PLUS w/Device Kit USE TO TEST BLOOD SUGAR   DAILY- DX COSE  E11.9   amLODipine 10 MG tablet Commonly known as:  NORVASC Take 10 mg by mouth daily.   aspirin 81 MG tablet Take 81 mg by mouth daily.   atorvastatin 20 MG tablet Commonly known as:  LIPITOR Take 20 mg by mouth daily.   Cinnamon 500 MG Tabs Take 1,000 mg by mouth daily.   fish oil-omega-3 fatty acids 1000 MG capsule Take 2 g by mouth daily.   glucose blood test strip Commonly known as:  ACCU-CHEK AVIVA Use TO TEST BLOOD SUGAR 6 TIMES DAILY DX CODE- E11.9   lisinopril 20 MG tablet Commonly known as:  PRINIVIL,ZESTRIL Take 20 mg by mouth daily.   lisinopril-hydrochlorothiazide 20-12.5 MG tablet Commonly known as:  PRINZIDE,ZESTORETIC   multivitamin tablet Take 1 tablet by mouth daily.   NOVOLOG 100 UNIT/ML injection Generic drug:  insulin aspart INJECT MAX OF 78 UNITS SUBCUTANEOUSLY PER DAY WITH V-GO PUMP   ONETOUCH DELICA LANCETS 96E Misc USE TO CHECK BLOOD SUGAR 5 TO 6 TIMES DAILY   accu-chek multiclix lancets Use TO TEST BLOOD SUGAR 6 TIMES DAILY- DX CODE- E11.9   rOPINIRole 0.5 MG tablet Commonly known as:  REQUIP TAKE ONE TABLET BY MOUTH AT BEDTIME   sertraline 100 MG tablet Commonly known as:  ZOLOFT Take 150 mg by mouth daily.   V-GO 40 Kit USE ONE PER DAY   VICTOZA 18 MG/3ML Sopn Generic drug:  liraglutide INJECT 1.'2MG'$   SUBCUTANEOUSLY ONCE DAILY AT  THE  SAME  TIME   Vitamin D3 3000 units Tabs Take 1 tablet by mouth daily.       Allergies: No Known Allergies  Past Medical History:  Diagnosis Date  . Degenerative arthritis of shoulder region 08/2013   left  . Disorder of bursae and tendons in left shoulder region 08/2013  . Hyperlipidemia   . Hypertension    under control with meds., has been on med. > 10 yr.  . IDDM (insulin dependent diabetes mellitus) ([place])     Past Surgical History:  Procedure Laterality Date  . CHOLECYSTECTOMY  08/29/2012   Procedure: LAPAROSCOPIC CHOLECYSTECTOMY WITH INTRAOPERATIVE CHOLANGIOGRAM;  Surgeon:  Imogene Burn. Georgette Dover, MD;  Location: [facility];  Service: General;  Laterality: N/A;  . SHOULDER ARTHROSCOPY WITH SUBACROMIAL DECOMPRESSION, ROTATOR CUFF REPAIR AND BICEP TENDON REPAIR Left 09/03/2013   Procedure: LEFT SHOULDER ARTHROSCOPY WITH EXTENSIVE DEBRIDMENT, DISTAL CLAVICULECTOMY, ROTATOR CUFF REPAIR AND SUBACROMIAL DECOMPRESSION  PARTIAL ACRIOMIOPLASTY WITH CORACOACROMIAL RELEASE;  Surgeon: Renette Butters, MD;  Location: [place];  Service: Orthopedics;  Laterality: Left;  . TRANSURETHRAL RESECTION OF BLADDER TUMOR WITH MITOMYCIN-C  08/24/2008    Family History  Problem Relation Age of Onset  . Diabetes Mother   . Cancer Father     Social History:  reports that he quit smoking about 19 years ago. He has never used smokeless tobacco. He reports that he does not drink alcohol or use drugs.    Review of Systems     HYPERTENSION: has been treated with amlodipine A half of 10 mg, and lisinopril HCT and also lisinopril.  Blood pressure also followed by nephrologist       Lipids: he is taking Lipitor 20 mg, last triglycerides and LDL are normal       Lab Results  Component Value Date   CHOL 108 03/28/2016   HDL 28.80 (L) 03/28/2016   LDLCALC 54 03/28/2016   TRIG 130.0 03/28/2016   CHOLHDL 4 03/28/2016               CKD: He has been followed by nephrologist for diabetic nephropathy.   Previous microalbumin/creatinine ratio was 393 and in 4/15 his 24-hour urine protein was 538 Last urine microalbumin ratio is Back to normal His creatinine is Fluctuating slightly   Lab Results  Component Value Date   CREATININE 1.88 (H) 01/01/2017   BUN 24 (H) 01/01/2017   NA 137 01/01/2017   K 4.3 01/01/2017   CL 105 01/01/2017   CO2 24 01/01/2017    Last foot exam in 5/17   Physical Examination:  BP 136/64   Pulse 60   Ht _0  (1.676 m)   Wt 196 lb (88.9 kg)   BMI 31.64 kg/m       Diabetic Foot Exam - Simple   Simple Foot Form Diabetic Foot exam was  performed with the following findings:  Yes 01/08/2017  8:28 AM  Visual Inspection No deformities, no ulcerations, no other skin breakdown bilaterally:  Yes Sensation Testing Intact to touch and monofilament testing bilaterally:  Yes Pulse Check Posterior Tibialis and Dorsalis pulse intact bilaterally:  Yes Comments      ASSESSMENT:  Diabetes type 2, nonobese  See history of present illness for detailed discussion of his current management, blood sugar patterns and problems identified  A1c is slightly higher at 7.3 He does not appear to be getting enough insulin to cover his evening meal and this may be getting lower the next morning Since fasting readings are occasionally near normal he does not need to increase his basal insulin However blood sugars tend to be low normal after lunch does not need as much insulin to cover his meals unless on weekends  His weight is consistent  He does not do much exercise  HYPERTENSION:  Blood pressure is  normal, is on 5 mg Norvasc  RENAL insufficiency: Stable   PLAN:    Continue V-go pump with 40 unit basal.  Reduce lunchtime bolus to 4 clicks instead of 5, may take more if he is eating a larger meals on weekends  Take 8 clicks at dinnertime and of course adjust the dose based on his meal size or carbohydrate intake especially on weekends  He will need to be very consistent with taking his clicks before eating  He can cut back on his monitoring at lunchtime and do more readings after supper at night  Discussed blood sugar  targets at different times  Continue Victoza  Encouraged him to be doing some regular walking the evenings  Since his sodium is back to normal he can increase his fluid intake by 1-2 cups a day especially with warm weather   Patient Instructions  Less sugars at lunch and more at bedtime  4 clicks at lunch and 8 at dinner on average; adjust based on type of meal (carb, fat)     Counseling time on subjects  discussed above is over 50% of today's 25 minute visit    Izan Miron 01/08/2017, 8:25 AM   Note: This office note was prepared with Estate agent. Any transcriptional errors that result from this process are unintentional.

## 2017-01-08 NOTE — Patient Instructions (Addendum)
Less sugars at lunch and more at bedtime  4 clicks at lunch and 8 at dinner on average; adjust based on type of meal (carb, fat)

## 2017-01-15 ENCOUNTER — Other Ambulatory Visit: Payer: Self-pay | Admitting: Endocrinology

## 2017-01-17 ENCOUNTER — Other Ambulatory Visit: Payer: Self-pay

## 2017-01-17 ENCOUNTER — Telehealth: Payer: Self-pay | Admitting: Endocrinology

## 2017-01-17 MED ORDER — INSULIN ASPART 100 UNIT/ML ~~LOC~~ SOLN: SUBCUTANEOUS | 3 refills | Status: DC

## 2017-01-17 NOTE — Telephone Encounter (Signed)
Pharmacist states patient is needing a refill but the insurance will not cover it until 4/21 either the amount of insulin will have to be until increased to 85 units  Per day or patient will have to cover the cost, patient will be out of insulin tomorrow.

## 2017-01-17 NOTE — Telephone Encounter (Signed)
This has been done.

## 2017-01-17 NOTE — Telephone Encounter (Signed)
Increase insulin to 85 units

## 2017-03-21 ENCOUNTER — Other Ambulatory Visit: Payer: Self-pay | Admitting: Endocrinology

## 2017-03-24 ENCOUNTER — Other Ambulatory Visit: Payer: Self-pay | Admitting: Endocrinology

## 2017-03-27 ENCOUNTER — Other Ambulatory Visit: Payer: Self-pay | Admitting: Endocrinology

## 2017-03-31 DIAGNOSIS — N2581 Secondary hyperparathyroidism of renal origin: Secondary | ICD-10-CM | POA: Diagnosis not present

## 2017-03-31 DIAGNOSIS — N183 Chronic kidney disease, stage 3 (moderate): Secondary | ICD-10-CM | POA: Diagnosis not present

## 2017-03-31 DIAGNOSIS — Z1159 Encounter for screening for other viral diseases: Secondary | ICD-10-CM | POA: Diagnosis not present

## 2017-03-31 DIAGNOSIS — E1122 Type 2 diabetes mellitus with diabetic chronic kidney disease: Secondary | ICD-10-CM | POA: Diagnosis not present

## 2017-03-31 DIAGNOSIS — Z794 Long term (current) use of insulin: Secondary | ICD-10-CM | POA: Diagnosis not present

## 2017-03-31 DIAGNOSIS — I129 Hypertensive chronic kidney disease with stage 1 through stage 4 chronic kidney disease, or unspecified chronic kidney disease: Secondary | ICD-10-CM | POA: Diagnosis not present

## 2017-03-31 DIAGNOSIS — M150 Primary generalized (osteo)arthritis: Secondary | ICD-10-CM | POA: Diagnosis not present

## 2017-03-31 DIAGNOSIS — E782 Mixed hyperlipidemia: Secondary | ICD-10-CM | POA: Diagnosis not present

## 2017-03-31 DIAGNOSIS — R079 Chest pain, unspecified: Secondary | ICD-10-CM | POA: Diagnosis not present

## 2017-03-31 DIAGNOSIS — Z0000 Encounter for general adult medical examination without abnormal findings: Secondary | ICD-10-CM | POA: Diagnosis not present

## 2017-03-31 DIAGNOSIS — M109 Gout, unspecified: Secondary | ICD-10-CM | POA: Diagnosis not present

## 2017-03-31 DIAGNOSIS — C61 Malignant neoplasm of prostate: Secondary | ICD-10-CM | POA: Diagnosis not present

## 2017-04-04 ENCOUNTER — Ambulatory Visit: Payer: PRIVATE HEALTH INSURANCE | Admitting: Physician Assistant

## 2017-04-04 ENCOUNTER — Other Ambulatory Visit (INDEPENDENT_AMBULATORY_CARE_PROVIDER_SITE_OTHER): Payer: Medicare Other

## 2017-04-04 DIAGNOSIS — E1165 Type 2 diabetes mellitus with hyperglycemia: Secondary | ICD-10-CM

## 2017-04-04 DIAGNOSIS — Z794 Long term (current) use of insulin: Secondary | ICD-10-CM

## 2017-04-04 LAB — BASIC METABOLIC PANEL
BUN: 19 mg/dL (ref 6–23)
CO2: 26 mEq/L (ref 19–32)
Calcium: 9.6 mg/dL (ref 8.4–10.5)
Chloride: 105 mEq/L (ref 96–112)
Creatinine, Ser: 1.63 mg/dL — ABNORMAL HIGH (ref 0.40–1.50)
GFR: 44.85 mL/min — ABNORMAL LOW (ref >60.00)
Glucose, Bld: 130 mg/dL — ABNORMAL HIGH (ref 70–99)
Potassium: 4.2 mEq/L (ref 3.5–5.1)
Sodium: 139 mEq/L (ref 135–145)

## 2017-04-04 LAB — HEMOGLOBIN A1C: Hgb A1c MFr Bld: 6.3 % (ref 4.6–6.5)

## 2017-04-08 NOTE — Progress Notes (Signed)
Patient ID: Alec [name], male   DOB: 06-08-1946, 71 y.o.   MRN: [id number]           Reason for Appointment:  Follow-up for Type 2 Diabetes  History of Present Illness:          Diagnosis: Type 2 diabetes mellitus, date of diagnosis: 2001        Recent history:    INSULIN regimen is described as:  V-go basal 40 units, mealtime clicks: 3-5-7/0  He was seen in consultation in 11/2014 when his A1c was 7.4 Since his blood sugars were averaging over 200 he was given Victoza in addition to his basal bolus insulin regimen in 6/16 He started on the V go pump in October 2016  His A1c is now 6.3, previously 7.3  Current blood sugar patterns from analysis of his meter download, daily management and problems identified:  Although his A1c is better  still tends to have high FASTING readings overall and this is the highest average of the day  However he has some readings in the low 100s also and not clear if blood sugars are higher from late evening snacks or higher fat meals in the evening; blood sugar was high this morning at 158 after eating pizza last evening even though postprandial reading was not high  Also has variable blood sugars after meals but blood sugars are the lowest around lunchtime  Blood sugars are variable based on his activity level and yesterday with being more active he got mildly hypoglycemic right after lunch without taking excessive bolus  He will take an extra 2 units bolus if he is having high readings before the meal  Blood sugars in the evenings also may be somewhat lower if he is more active doing yard work  He thinks he is watching his diet better and his losing weight gradually  He is  taking VICTOZA 1.2 mg as before  Non-insulin hypoglycemic drugs the patient is taking are: Victoza 1.2 mg daily Side effects from medications have been:none  Compliance with the medical regimen: Good  Hypoglycemia: Minimal  Glucose monitoring:  now checking blood sugars  4 times a day  Glucometer:  One Touch       Blood Glucose readings by download:  Mean values apply above for all meters except median for One Touch  PRE-MEAL Fasting Lunch Dinner Bedtime Overall  Glucose range: 106-180       Mean/median: 148 117  119   129+/-41    POST-MEAL PC Breakfast PC Lunch PC Dinner  Glucose range:   76-2 68   Mean/median:  120  107     Self-care: The diet that the patient has been following is: Relatively low fat     Meals: 3 meals per day. Lunch  1 pm Dinner 6 pm Breakfast is usually an egg sandwich  Lunch: mostly taking  a sandwich from home Eating a balanced meals in the evening.  He will have snacks on granola bars              Exercise: Sometimes active at work no programmed activity, doing some yardwork on weekends  Dietician visit, most recent: 6/15.               Weight history:  Wt Readings from Last 3 Encounters:  04/09/17 193 lb 12.8 oz (87.9 kg)  01/08/17 196 lb (88.9 kg)  10/01/16 198 lb 9.6 oz (90.1 kg)    Glycemic control:    Lab Results  Component Value Date   HGBA1C 6.3 04/04/2017   HGBA1C 7.3 (H) 01/01/2017   HGBA1C 7.0 (H) 09/27/2016   Lab Results  Component Value Date   MICROALBUR 12.3 (H) 09/27/2016   LDLCALC 54 03/28/2016   CREATININE 1.63 (H) 04/04/2017     Past history:  He was not symptomatic at time of diagnosis and was probably treated with Amaryl only.  No details are available of previous management However he thinks he was not given metformin at any time because of his "kidney problem" He had a A1c of over 9% in 2013 and may have been started on insulin at that time. Most likely has been taking Levemir and NovoLog since then A1c records show his results were over 8% in 2014 and in 4/15 but subsequently down to 7.1 in November 2015 Amaryl was stopped in 7/16  Other active problems are addressed in Review of systems   Allergies as of 04/09/2017   No Known Allergies     Medication List         Accurate as of 04/09/17  8:08 AM. Always use your most recent med list.          ACCU-CHEK AVIVA PLUS w/Device Kit USE TO TEST BLOOD SUGAR   DAILY- DX COSE E11.9   amLODipine 10 MG tablet Commonly known as:  NORVASC Take 10 mg by mouth daily.   aspirin 81 MG tablet Take 81 mg by mouth daily.   atorvastatin 20 MG tablet Commonly known as:  LIPITOR Take 20 mg by mouth daily.   Cinnamon 500 MG Tabs Take 1,000 mg by mouth daily.   fish oil-omega-3 fatty acids 1000 MG capsule Take 2 g by mouth daily.   glucose blood test strip Commonly known as:  ACCU-CHEK AVIVA Use TO TEST BLOOD SUGAR 6 TIMES DAILY DX CODE- E11.9   insulin aspart 100 UNIT/ML injection Commonly known as:  NOVOLOG INJECT MAX OF 85 UNITS SUBCUTANEOUSLY PER DAY WITH V-GO PUMP   lisinopril 20 MG tablet Commonly known as:  PRINIVIL,ZESTRIL Take 20 mg by mouth daily.   multivitamin tablet Take 1 tablet by mouth daily.   ONETOUCH DELICA LANCETS 33G Misc USE TO CHECK BLOOD SUGAR 5 TO 6 TIMES DAILY   accu-chek multiclix lancets Use TO TEST BLOOD SUGAR 6 TIMES DAILY- DX CODE- E11.9   rOPINIRole 0.5 MG tablet Commonly known as:  REQUIP TAKE ONE TABLET BY MOUTH AT BEDTIME   sertraline 100 MG tablet Commonly known as:  ZOLOFT Take 150 mg by mouth daily.   V-GO 40 Kit USE ONE PER DAY   VICTOZA 18 MG/3ML Sopn Generic drug:  liraglutide INJECT 1.2 MG SUBCUTANEOUSLY ONCE DAILY AT THE SAME TIME   Vitamin D3 3000 units Tabs Take 1 tablet by mouth daily.       Allergies: No Known Allergies  Past Medical History:  Diagnosis Date  . Degenerative arthritis of shoulder region 08/2013   left  . Disorder of bursae and tendons in left shoulder region 08/2013  . Hyperlipidemia   . Hypertension    under control with meds., has been on med. > 10 yr.  . IDDM (insulin dependent diabetes mellitus) ([facility])     Past Surgical History:  Procedure Laterality Date  . CHOLECYSTECTOMY  08/29/2012   Procedure:  LAPAROSCOPIC CHOLECYSTECTOMY WITH INTRAOPERATIVE CHOLANGIOGRAM;  Surgeon: Wilmon Arms. Corliss Skains, MD;  Location: [facility];  Service: General;  Laterality: N/A;  . SHOULDER ARTHROSCOPY WITH SUBACROMIAL DECOMPRESSION, ROTATOR CUFF REPAIR AND BICEP TENDON REPAIR  Left 09/03/2013   Procedure: LEFT SHOULDER ARTHROSCOPY WITH EXTENSIVE DEBRIDMENT, DISTAL CLAVICULECTOMY, ROTATOR CUFF REPAIR AND SUBACROMIAL DECOMPRESSION PARTIAL ACRIOMIOPLASTY WITH CORACOACROMIAL RELEASE;  Surgeon: Renette Butters, MD;  Location: [place];  Service: Orthopedics;  Laterality: Left;  . TRANSURETHRAL RESECTION OF BLADDER TUMOR WITH MITOMYCIN-C  08/24/2008    Family History  Problem Relation Age of Onset  . Diabetes Mother   . Cancer Father     Social History:  reports that he quit smoking about 19 years ago. He has never used smokeless tobacco. He reports that he does not drink alcohol or use drugs.    Review of Systems     HYPERTENSION: has been treated with amlodipine A half of 10 mg, and lisinopril HCT and also lisinopril.  Blood pressure also followed by nephrologist       Lipids: he is taking Lipitor 20 mg, last triglycerides and LDL are normal       Lab Results  Component Value Date   CHOL 108 03/28/2016   HDL 28.80 (L) 03/28/2016   LDLCALC 54 03/28/2016   TRIG 130.0 03/28/2016   CHOLHDL 4 03/28/2016               CKD: He has been followed by nephrologist for diabetic nephropathy.   Previous microalbumin/creatinine ratio was 393 and in 4/15 his 24-hour urine protein was 538 Last urine microalbumin ratio is Back to normal His creatinine is Fluctuating slightly   Lab Results  Component Value Date   CREATININE 1.63 (H) 04/04/2017   BUN 19 04/04/2017   NA 139 04/04/2017   K 4.2 04/04/2017   CL 105 04/04/2017   CO2 26 04/04/2017    Last foot exam in 5/17   Physical Examination:  BP 138/84   Pulse 64   Ht '5\' 6"'$  (1.676 m)   Wt 193 lb 12.8 oz (87.9 kg)   SpO2 97%   BMI 31.28 kg/m         ASSESSMENT:  Diabetes type 2, nonobese  See history of present illness for detailed discussion of his current management, blood sugar patterns and problems identified  A1c is Excellent now at 6.3 However has variability in blood sugars as discussed above for multiple reasons He is not adjusting his boluses as much as needed for planned activity, dietary changes and pre-meal blood sugar consistently  HYPERTENSION:  Blood pressure is high  normal, is on 5 mg Norvasc and followed by PCP  RENAL insufficiency: Stable to improved   PLAN:    Continue V-go pump with 40 unit basal.  Reduce lunchtime bolus to 4 clicks instead of 5 when planning to be more active  Will make sure he will have snacks in between meals if he is more active during the day  Take 7-8 clicks at dinnertime based on meal size and carbohydrate intake  Also needs more blood sugar monitoring after supper  Discussed in detail the use of the freestyle Libre sensor which was appropriate for him because of his variable blood sugars, need for adjustment of meal bolus based on glucose, currently monitoring 4 times a day as well as help in avoiding hypoglycemia especially when he is more active; this will also help him adjust his diet based on blood sugar patterns  No changes and basic regimen for diabetes   There are no Patient Instructions on file for this visit.   Counseling time on subjects discussed in history, assessment and plan sections is over 50% of today's  25 minute visit    Santasia Rew 04/09/2017, 8:08 AM   Note: This office note was prepared with Estate agent. Any transcriptional errors that result from this process are unintentional.

## 2017-04-09 ENCOUNTER — Encounter: Payer: Self-pay | Admitting: Endocrinology

## 2017-04-09 ENCOUNTER — Ambulatory Visit (INDEPENDENT_AMBULATORY_CARE_PROVIDER_SITE_OTHER): Payer: Medicare Other | Admitting: Endocrinology

## 2017-04-09 VITALS — BP 138/84 | HR 64 | Ht 66.0 in | Wt 193.8 lb

## 2017-04-09 DIAGNOSIS — E1165 Type 2 diabetes mellitus with hyperglycemia: Secondary | ICD-10-CM | POA: Diagnosis not present

## 2017-04-09 DIAGNOSIS — Z794 Long term (current) use of insulin: Secondary | ICD-10-CM

## 2017-04-09 DIAGNOSIS — N183 Chronic kidney disease, stage 3 unspecified: Secondary | ICD-10-CM

## 2017-04-15 ENCOUNTER — Telehealth: Payer: Self-pay | Admitting: Student

## 2017-04-15 NOTE — Telephone Encounter (Signed)
Received records from [place] [facility] for appointment on 04/17/17 with Bernerd Pho, [place].  Records put with Brittany's schedule for 04/17/17. lp

## 2017-04-15 NOTE — Progress Notes (Signed)
Cardiology Office Note    Date:  04/17/2017   ID:  Alec [name], DOB Feb 05, 1946, MRN [id number]  PCP:  Mayra Neer, MD  Cardiologist: New to [facility] - Wishes to follow with Dr. Percival Spanish (Wife and Father-in-law's Cardiologist)  Chief Complaint  Patient presents with  . New Patient (Initial Visit)    Chest Pain    History of Present Illness:    Alec [name] is a 71 y.o. male with past medical history of HTN, HLD, IDDM , and Stage 3 CKD who presents to the office today for evaluation of chest pain at the request of Dr. Brigitte Pulse.   He was evaluated by his PCP on 03/31/2017 and reported episodes of chest pain on exertion, therefore Cardiology follow-up was recommended. Labs were checked at that time and showed total cholesterol of 125, HDL 35, and LDL 68. Creatinine was at 1.76 (close to baseline). Hgb A1c is followed by Endocrinology and was improved to 6.3 when checked earlier this month.  In talking with the patient today, he reports having episodes of chest pain with exertion for the past several months. He notices this mostly when weed-eating the yard or performing work on his 64 wheeler truck. He describes this as a tightness along his sternum and left pectoral region with which he experiences associated dyspnea and diaphoresis. Symptoms typically resolve with rest. He denies any episodes of chest pain occurring at rest. He is able to walk around his home and grocery store without any anginal symptoms. He denies any recent orthopnea, PND, lower extremity edema, palpitations, dizziness, or presyncope.  He denies any history of known CAD or prior MIs. He had a stress test performed in 2006 but this is not available for review in Epic. He does report a history of HTN, HLD, and IDDM as above. Has a strong family history of CAD with both grandfathers having MI's in their 55's and his father having an MI at age 78. His son also died of a sudden death at age 30. He has prior tobacco use but quit  smoking 30+ years ago. No alcohol use or recreational drug use.    Past Medical History:  Diagnosis Date  . Degenerative arthritis of shoulder region 08/2013   left  . Disorder of bursae and tendons in left shoulder region 08/2013  . Hyperlipidemia   . Hypertension    under control with meds., has been on med. > 10 yr.  . IDDM (insulin dependent diabetes mellitus) ([place])     Past Surgical History:  Procedure Laterality Date  . CHOLECYSTECTOMY  08/29/2012   Procedure: LAPAROSCOPIC CHOLECYSTECTOMY WITH INTRAOPERATIVE CHOLANGIOGRAM;  Surgeon: Imogene Burn. Georgette Dover, MD;  Location: [facility];  Service: General;  Laterality: N/A;  . SHOULDER ARTHROSCOPY WITH SUBACROMIAL DECOMPRESSION, ROTATOR CUFF REPAIR AND BICEP TENDON REPAIR Left 09/03/2013   Procedure: LEFT SHOULDER ARTHROSCOPY WITH EXTENSIVE DEBRIDMENT, DISTAL CLAVICULECTOMY, ROTATOR CUFF REPAIR AND SUBACROMIAL DECOMPRESSION PARTIAL ACRIOMIOPLASTY WITH CORACOACROMIAL RELEASE;  Surgeon: Renette Butters, MD;  Location: [facility];  Service: Orthopedics;  Laterality: Left;  . TRANSURETHRAL RESECTION OF BLADDER TUMOR WITH MITOMYCIN-C  08/24/2008    Current Medications: Outpatient Medications Prior to Visit  Medication Sig Dispense Refill  . amLODipine (NORVASC) 10 MG tablet Take 10 mg by mouth daily.    Marland Kitchen aspirin 81 MG tablet Take 81 mg by mouth daily.    Marland Kitchen atorvastatin (LIPITOR) 20 MG tablet Take 20 mg by mouth daily.    . Blood Glucose Monitoring Suppl (  ACCU-CHEK AVIVA PLUS) w/Device KIT USE TO TEST BLOOD SUGAR   DAILY- DX COSE E11.9 1 kit 1  . Cholecalciferol (VITAMIN D3) 3000 UNITS TABS Take 1 tablet by mouth daily.    . Cinnamon 500 MG TABS Take 1,000 mg by mouth daily.    . fish oil-omega-3 fatty acids 1000 MG capsule Take 2 g by mouth daily.    Marland Kitchen glucose blood (ACCU-CHEK AVIVA) test strip Use TO TEST BLOOD SUGAR 6 TIMES DAILY DX CODE- E11.9 200 each 5  . insulin aspart (NOVOLOG) 100 UNIT/ML injection INJECT MAX OF 85 UNITS  SUBCUTANEOUSLY PER DAY WITH V-GO PUMP 30 mL 3  . Insulin Disposable Pump (V-GO 40) KIT USE ONE PER DAY 30 kit 3  . lisinopril (PRINIVIL,ZESTRIL) 20 MG tablet Take 20 mg by mouth daily.    . Multiple Vitamin (MULTIVITAMIN) tablet Take 1 tablet by mouth daily.    Marland Kitchen rOPINIRole (REQUIP) 0.5 MG tablet TAKE ONE TABLET BY MOUTH AT BEDTIME 30 tablet 3  . sertraline (ZOLOFT) 100 MG tablet Take 150 mg by mouth daily.     Marland Kitchen VICTOZA 18 MG/3ML SOPN INJECT 1.2 MG SUBCUTANEOUSLY ONCE DAILY AT THE SAME TIME 6 pen 1  . Lancets (ACCU-CHEK MULTICLIX) lancets Use TO TEST BLOOD SUGAR 6 TIMES DAILY- DX CODE- E11.9 204 each 5  . ONETOUCH DELICA LANCETS 44H MISC USE TO CHECK BLOOD SUGAR 5 TO 6 TIMES DAILY 100 each 3   No facility-administered medications prior to visit.      Allergies:   Patient has no known allergies.   Social History   Social History  . Marital status: Married    Spouse name: N/A  . Number of children: N/A  . Years of education: N/A   Social History Main Topics  . Smoking status: Former Smoker    Quit date: 09/11/1997  . Smokeless tobacco: Never Used  . Alcohol use No  . Drug use: No  . Sexual activity: Not Currently   Other Topics Concern  . None   Social History Narrative  . None     Family History:  The patient's family history includes CAD in his maternal grandfather and paternal grandfather; Cancer in his father; Coronary artery disease in his father; Diabetes in his mother; Sudden death in his son.   Review of Systems:   Please see the history of present illness.     General:  No chills, fever, night sweats or weight changes.  Cardiovascular:  No edema, orthopnea, palpitations, paroxysmal nocturnal dyspnea. Positive for chest pain and dyspnea on exertion.  Dermatological: No rash, lesions/masses Respiratory: No cough, dyspnea Urologic: No hematuria, dysuria Abdominal:   No nausea, vomiting, diarrhea, bright red blood per rectum, melena, or hematemesis Neurologic:  No  visual changes, wkns, changes in mental status. All other systems reviewed and are otherwise negative except as noted above.   Physical Exam:    VS:  BP (!) 148/78 (BP Location: Right Arm, Cuff Size: Normal)   Pulse 68   Ht 5' 6" (1.676 m)   Wt 193 lb 6.4 oz (87.7 kg)   BMI 31.22 kg/m    General: Well developed, well nourished Caucasian male appearing in no acute distress. Head: Normocephalic, atraumatic, sclera non-icteric, no xanthomas, nares are without discharge.  Neck: No carotid bruits. JVD not elevated.  Lungs: Respirations regular and unlabored, without wheezes or rales.  Heart: Regular rate and rhythm. No S3 or S4.  No murmur, no rubs, or gallops appreciated. Abdomen: Soft, non-tender,  non-distended with normoactive bowel sounds. No hepatomegaly. No rebound/guarding. No obvious abdominal masses. Msk:  Strength and tone appear normal for age. No joint deformities or effusions. Extremities: No clubbing or cyanosis. No lower extremity edema.  Distal pedal pulses are 2+ bilaterally. Neuro: Alert and oriented X 3. Moves all extremities spontaneously. No focal deficits noted. Psych:  Responds to questions appropriately with a normal affect. Skin: No rashes or lesions noted  Wt Readings from Last 3 Encounters:  04/17/17 193 lb 6.4 oz (87.7 kg)  04/09/17 193 lb 12.8 oz (87.9 kg)  01/08/17 196 lb (88.9 kg)     Studies/Labs Reviewed:   EKG:  EKG is ordered today. The EKG ordered today demonstrates sinus bradycardia with 1st degree AV Block, HR 58. No acute ST or T-wave changes.   Recent Labs: 09/27/2016: ALT 24 04/04/2017: BUN 19; Creatinine, Ser 1.63; Potassium 4.2; Sodium 139   Lipid Panel    Component Value Date/Time   CHOL 108 03/28/2016 0803   TRIG 130.0 03/28/2016 0803   HDL 28.80 (L) 03/28/2016 0803   CHOLHDL 4 03/28/2016 0803   VLDL 26.0 03/28/2016 0803   LDLCALC 54 03/28/2016 0803    Additional studies/ records that were reviewed today include:   EKG:  12/2013 Normal sinus rhythm, HR 62, with no acute ST or T-wave changes.   Assessment:    1. Chest pain, unspecified type   2. DOE (dyspnea on exertion)   3. Essential hypertension   4. Hyperlipidemia LDL goal <100   5. IDDM (insulin dependent diabetes mellitus) ([place])   6. CKD (chronic kidney disease) stage 3, GFR 30-59 ml/min      Plan:   In order of problems listed above:  1. Chest Pain/Dyspnea on Exertion - the patient reports having episodes of chest tightness with exertion for the past several months. He notices this mostly when weed-eating the yard or performing work on his 18-wheeler truck. Experiences associated dyspnea and diaphoresis and symptoms resolve with rest.  - he denies any prior cardiac history but does have multiple cardiac risk factors including HTN, HLD, IDDM, and a significant family history of CAD.  - with his Stage 3 CKD, will proceed with a Lexiscan Myoview for initial ischemic evaluation (patient does not think he could achieve target HR with treadmill testing secondary to bilateral knee pain) as his symptoms sound most concerning for unstable angina. If stress test is high-risk, would then need definitive evaluation with a cardiac catheterization with proper hydration. With Mr. Turnage being a new patient to our practice, this was discussed with Dr. Ellyn Hack (DOD) who was in agreement with the stated plan.   2. HTN - BP elevated to 165/95 on initial check, at 142/76 on recheck. - reports SBP is usually in the 120's - 130's when checked at home. - continue Amlodipine 2m daily and Lisinopril 231mdaily.   3. HLD - Lipid Panel in 03/2017 showed total cholesterol of 125, HDL 35, and LDL 68. At goal with LDL < 100 in the setting of Type 2 DM.  - continue Atorvastatin 2056maily.   4. IDDM - Hgb A1c improved to 6.3 earlier this month. - followed by Endocrinology.   5. Stage 3 CKD - creatinine stable at 1.76 when checked on 03/31/2017. Close to baseline.     Medication Adjustments/Labs and Tests Ordered: Current medicines are reviewed at length with the patient today.  Concerns regarding medicines are outlined above.  Medication changes, Labs and Tests ordered today are listed in  the Patient Instructions below. Patient Instructions  Medication Instructions:  Your physician recommends that you continue on your current medications as directed. Please refer to the Current Medication list given to you today.  Labwork: None ordered  Testing/Procedures: Your physician has requested that you have a lexiscan myoview. For further information please visit [email or web address]. Please follow instruction sheet, as given..  DO NOT DO MORNING INSULIN CLICK ON THE MORNING OF YOUR STRESS TEST  Follow-Up: Your physician wants you to follow-up in: [place] DR. HOCHREIN   You will receive a reminder letter in the mail two months in advance. If you don't receive a letter, please call our office to schedule the follow-up appointment.  If you need a refill on your cardiac medications before your next appointment, please call your pharmacy.   Signed, Erma Heritage, PA-C  04/17/2017 5:42 PM    [facility], [place], [place]  [address] Phone: [phone]; Fax: [phone]  [address], [place],  [address] Phone: [phone]

## 2017-04-17 ENCOUNTER — Encounter: Payer: Self-pay | Admitting: Student

## 2017-04-17 ENCOUNTER — Ambulatory Visit (INDEPENDENT_AMBULATORY_CARE_PROVIDER_SITE_OTHER): Payer: Medicare Other | Admitting: Student

## 2017-04-17 VITALS — BP 148/78 | HR 68 | Ht 66.0 in | Wt 193.4 lb

## 2017-04-17 DIAGNOSIS — R0600 Dyspnea, unspecified: Secondary | ICD-10-CM

## 2017-04-17 DIAGNOSIS — I10 Essential (primary) hypertension: Secondary | ICD-10-CM | POA: Diagnosis not present

## 2017-04-17 DIAGNOSIS — R0609 Other forms of dyspnea: Secondary | ICD-10-CM

## 2017-04-17 DIAGNOSIS — N183 Chronic kidney disease, stage 3 unspecified: Secondary | ICD-10-CM

## 2017-04-17 DIAGNOSIS — Z794 Long term (current) use of insulin: Secondary | ICD-10-CM | POA: Diagnosis not present

## 2017-04-17 DIAGNOSIS — E119 Type 2 diabetes mellitus without complications: Secondary | ICD-10-CM | POA: Diagnosis not present

## 2017-04-17 DIAGNOSIS — R079 Chest pain, unspecified: Secondary | ICD-10-CM | POA: Diagnosis not present

## 2017-04-17 DIAGNOSIS — E785 Hyperlipidemia, unspecified: Secondary | ICD-10-CM

## 2017-04-17 DIAGNOSIS — N178 Other acute kidney failure: Secondary | ICD-10-CM | POA: Diagnosis not present

## 2017-04-17 DIAGNOSIS — IMO0001 Reserved for inherently not codable concepts without codable children: Secondary | ICD-10-CM

## 2017-04-17 NOTE — Patient Instructions (Addendum)
Medication Instructions:  Your physician recommends that you continue on your current medications as directed. Please refer to the Current Medication list given to you today.   Labwork: None ordered  Testing/Procedures: Your physician has requested that you have a lexiscan myoview. For further information please visit [email or web address]. Please follow instruction sheet, as given..  DO NOT DO MORNING INSULIN CLICK ON THE MORNING OF YOUR STRESS TEST   Follow-Up: Your physician wants you to follow-up in: [place] DR. HOCHREIN   You will receive a reminder letter in the mail two months in advance. If you don't receive a letter, please call our office to schedule the follow-up appointment.    Any Other Special Instructions Will Be Listed Below (If Applicable).  Regadenoson injection What is this medicine? REGADENOSON is used to test the heart for coronary artery disease. It is used in patients who can not exercise for their stress test. This medicine may be used for other purposes; ask your health care provider or pharmacist if you have questions. COMMON BRAND NAME(S): Lexiscan What should I tell my health care provider before I take this medicine? They need to know if you have any of these conditions: -heart problems -lung or breathing disease, like asthma or COPD -an unusual or allergic reaction to regadenoson, other medicines, foods, dyes, or preservatives -pregnant or trying to get pregnant -breast-feeding How should I use this medicine? This medicine is for injection into a vein. It is given by a health care professional in a hospital or clinic setting. Talk to your pediatrician regarding the use of this medicine in children. Special care may be needed. Overdosage: If you think you have taken too much of this medicine contact a poison control center or emergency room at once. NOTE: This medicine is only for you. Do not share this medicine with others. What if I miss a  dose? This does not apply. What may interact with this medicine? -caffeine -dipyridamole -guarana -theophylline This list may not describe all possible interactions. Give your health care provider a list of all the medicines, herbs, non-prescription drugs, or dietary supplements you use. Also tell them if you smoke, drink alcohol, or use illegal drugs. Some items may interact with your medicine. What should I watch for while using this medicine? Your condition will be monitored carefully while you are receiving this medicine. Do not take medicines, foods, or drinks with caffeine (like coffee, tea, or colas) for at least 12 hours before your test. If you do not know if something contains caffeine, ask your health care professional. What side effects may I notice from receiving this medicine? Side effects that you should report to your doctor or health care professional as soon as possible: -allergic reactions like skin rash, itching or hives, swelling of the face, lips, or tongue -breathing problems -chest pain, tightness or palpitations -severe headache Side effects that usually do not require medical attention (report to your doctor or health care professional if they continue or are bothersome): -flushing -headache -irritation or pain at site where injected -nausea, vomiting This list may not describe all possible side effects. Call your doctor for medical advice about side effects. You may report side effects to FDA at 1-800-FDA-1088. Where should I keep my medicine? This drug is given in a hospital or clinic and will not be stored at home. NOTE: This sheet is a summary. It may not cover all possible information. If you have questions about this medicine, talk to your doctor, pharmacist, or  health care provider.  2018 Elsevier/Gold Standard (2008-05-16 15:08:13)    If you need a refill on your cardiac medications before your next appointment, please call your pharmacy.

## 2017-04-23 ENCOUNTER — Telehealth (HOSPITAL_COMMUNITY): Payer: Self-pay

## 2017-04-23 NOTE — Telephone Encounter (Signed)
Encounter complete. 

## 2017-04-23 NOTE — Telephone Encounter (Signed)
Awaiting return call

## 2017-04-25 ENCOUNTER — Ambulatory Visit (HOSPITAL_COMMUNITY)
Admission: RE | Admit: 2017-04-25 | Discharge: 2017-04-25 | Disposition: A | Discharge status: Home / Self Care | Payer: Medicare Other | Source: Ambulatory Visit | Attending: Cardiovascular Disease | Admitting: Cardiovascular Disease

## 2017-04-25 DIAGNOSIS — R0609 Other forms of dyspnea: Secondary | ICD-10-CM

## 2017-04-25 DIAGNOSIS — R079 Chest pain, unspecified: Secondary | ICD-10-CM | POA: Diagnosis not present

## 2017-04-25 DIAGNOSIS — R9439 Abnormal result of other cardiovascular function study: Secondary | ICD-10-CM | POA: Insufficient documentation

## 2017-04-25 DIAGNOSIS — R0600 Dyspnea, unspecified: Secondary | ICD-10-CM

## 2017-04-25 LAB — MYOCARDIAL PERFUSION IMAGING
LV dias vol: 146 mL (ref 62–150)
LV sys vol: 65 mL
Peak HR: 76 {beats}/min
Rest HR: 56 {beats}/min
SDS: 0
SRS: 2
SSS: 2
TID: 1.15

## 2017-04-25 MED ORDER — TECHNETIUM TC 99M TETROFOSMIN IV KIT
10.5000 | PACK | Freq: Once | INTRAVENOUS | Status: AC | PRN
  Administered 2017-04-25: 10.5 via INTRAVENOUS
  Filled 2017-04-25: qty 11

## 2017-04-25 MED ORDER — REGADENOSON 0.4 MG/5ML IV SOLN
0.4000 mg | Freq: Once | INTRAVENOUS | Status: AC
  Administered 2017-04-25: 0.4 mg via INTRAVENOUS

## 2017-04-25 MED ORDER — TECHNETIUM TC 99M TETROFOSMIN IV KIT
30.2000 | PACK | Freq: Once | INTRAVENOUS | Status: AC | PRN
  Administered 2017-04-25: 30.2 via INTRAVENOUS
  Filled 2017-04-25: qty 31

## 2017-04-30 ENCOUNTER — Other Ambulatory Visit: Payer: Self-pay | Admitting: Orthopedic Surgery

## 2017-04-30 DIAGNOSIS — M25519 Pain in unspecified shoulder: Secondary | ICD-10-CM

## 2017-04-30 DIAGNOSIS — M542 Cervicalgia: Secondary | ICD-10-CM | POA: Diagnosis not present

## 2017-04-30 DIAGNOSIS — M25511 Pain in right shoulder: Secondary | ICD-10-CM | POA: Diagnosis not present

## 2017-05-05 ENCOUNTER — Telehealth: Payer: Self-pay | Admitting: Endocrinology

## 2017-05-05 NOTE — Telephone Encounter (Signed)
[facility] called to advise about the note below. The fax number is [phone].

## 2017-05-05 NOTE — Telephone Encounter (Signed)
Routing to you °

## 2017-05-05 NOTE — Telephone Encounter (Signed)
Patient called to advise that [facility] is waiting on the last notes from the patient's visit to be faxed over for his libre. Please advise. Notify patient once this has been done.

## 2017-05-06 NOTE — Telephone Encounter (Signed)
Sent over last OV to [facility].

## 2017-05-06 NOTE — Telephone Encounter (Signed)
Patient notified

## 2017-05-07 DIAGNOSIS — C61 Malignant neoplasm of prostate: Secondary | ICD-10-CM | POA: Diagnosis not present

## 2017-05-07 DIAGNOSIS — Z8551 Personal history of malignant neoplasm of bladder: Secondary | ICD-10-CM | POA: Diagnosis not present

## 2017-05-13 ENCOUNTER — Ambulatory Visit
Admission: RE | Admit: 2017-05-13 | Discharge: 2017-05-13 | Disposition: A | Discharge status: Home / Self Care | Payer: Medicare Other | Source: Ambulatory Visit | Attending: Orthopedic Surgery | Admitting: Orthopedic Surgery

## 2017-05-13 DIAGNOSIS — M25519 Pain in unspecified shoulder: Secondary | ICD-10-CM

## 2017-05-13 DIAGNOSIS — M25511 Pain in right shoulder: Secondary | ICD-10-CM | POA: Diagnosis not present

## 2017-05-21 DIAGNOSIS — M25511 Pain in right shoulder: Secondary | ICD-10-CM | POA: Diagnosis not present

## 2017-05-30 ENCOUNTER — Other Ambulatory Visit: Payer: Self-pay | Admitting: Endocrinology

## 2017-06-08 DIAGNOSIS — R112 Nausea with vomiting, unspecified: Secondary | ICD-10-CM | POA: Diagnosis not present

## 2017-06-08 DIAGNOSIS — R109 Unspecified abdominal pain: Secondary | ICD-10-CM | POA: Diagnosis not present

## 2017-06-23 DIAGNOSIS — R8271 Bacteriuria: Secondary | ICD-10-CM | POA: Diagnosis not present

## 2017-06-23 DIAGNOSIS — C61 Malignant neoplasm of prostate: Secondary | ICD-10-CM | POA: Diagnosis not present

## 2017-07-01 DIAGNOSIS — N183 Chronic kidney disease, stage 3 (moderate): Secondary | ICD-10-CM | POA: Diagnosis not present

## 2017-07-01 DIAGNOSIS — N2581 Secondary hyperparathyroidism of renal origin: Secondary | ICD-10-CM | POA: Diagnosis not present

## 2017-07-07 ENCOUNTER — Other Ambulatory Visit: Payer: Medicare Other

## 2017-07-08 ENCOUNTER — Other Ambulatory Visit: Payer: Medicare Other

## 2017-07-09 DIAGNOSIS — N2581 Secondary hyperparathyroidism of renal origin: Secondary | ICD-10-CM | POA: Diagnosis not present

## 2017-07-09 DIAGNOSIS — I129 Hypertensive chronic kidney disease with stage 1 through stage 4 chronic kidney disease, or unspecified chronic kidney disease: Secondary | ICD-10-CM | POA: Diagnosis not present

## 2017-07-09 DIAGNOSIS — Z23 Encounter for immunization: Secondary | ICD-10-CM | POA: Diagnosis not present

## 2017-07-09 DIAGNOSIS — D631 Anemia in chronic kidney disease: Secondary | ICD-10-CM | POA: Diagnosis not present

## 2017-07-09 DIAGNOSIS — N183 Chronic kidney disease, stage 3 (moderate): Secondary | ICD-10-CM | POA: Diagnosis not present

## 2017-07-10 ENCOUNTER — Ambulatory Visit: Payer: Medicare Other | Admitting: Endocrinology

## 2017-07-15 ENCOUNTER — Other Ambulatory Visit: Payer: Self-pay | Admitting: Endocrinology

## 2017-07-19 ENCOUNTER — Other Ambulatory Visit: Payer: Self-pay | Admitting: Endocrinology

## 2017-07-21 ENCOUNTER — Telehealth: Payer: Self-pay | Admitting: Endocrinology

## 2017-07-21 ENCOUNTER — Other Ambulatory Visit (INDEPENDENT_AMBULATORY_CARE_PROVIDER_SITE_OTHER): Payer: Medicare Other

## 2017-07-21 ENCOUNTER — Other Ambulatory Visit: Payer: Self-pay

## 2017-07-21 DIAGNOSIS — Z794 Long term (current) use of insulin: Secondary | ICD-10-CM | POA: Diagnosis not present

## 2017-07-21 DIAGNOSIS — E1165 Type 2 diabetes mellitus with hyperglycemia: Secondary | ICD-10-CM | POA: Diagnosis not present

## 2017-07-21 LAB — COMPREHENSIVE METABOLIC PANEL
ALT: 26 U/L (ref 0–53)
AST: 20 U/L (ref 0–37)
Albumin: 4.2 g/dL (ref 3.5–5.2)
Alkaline Phosphatase: 71 U/L (ref 39–117)
BUN: 21 mg/dL (ref 6–23)
CO2: 28 mEq/L (ref 19–32)
Calcium: 9.8 mg/dL (ref 8.4–10.5)
Chloride: 101 mEq/L (ref 96–112)
Creatinine, Ser: 1.66 mg/dL — ABNORMAL HIGH (ref 0.40–1.50)
GFR: 43.87 mL/min — ABNORMAL LOW (ref >60.00)
Glucose, Bld: 109 mg/dL — ABNORMAL HIGH (ref 70–99)
Potassium: 3.7 mEq/L (ref 3.5–5.1)
Sodium: 137 mEq/L (ref 135–145)
Total Bilirubin: 0.9 mg/dL (ref 0.2–1.2)
Total Protein: 7.1 g/dL (ref 6.0–8.3)

## 2017-07-21 LAB — MICROALBUMIN / CREATININE URINE RATIO
Creatinine,U: 46.4 mg/dL
Microalb Creat Ratio: 11.2 mg/g (ref 0.0–30.0)
Microalb, Ur: 5.2 mg/dL — ABNORMAL HIGH (ref 0.0–1.9)

## 2017-07-21 LAB — HEMOGLOBIN A1C: Hgb A1c MFr Bld: 6.6 % — ABNORMAL HIGH (ref 4.6–6.5)

## 2017-07-21 MED ORDER — INSULIN ASPART 100 UNIT/ML ~~LOC~~ SOLN: SUBCUTANEOUS | 3 refills | Status: DC

## 2017-07-21 NOTE — Telephone Encounter (Signed)
I have resent this prescription to the [facility] with the correct diagnosis code.

## 2017-07-21 NOTE — Telephone Encounter (Signed)
Pharmacy [facility]- needs diagnosis code for Novalog medication before they can fill the prescription. Please call [facility] ph# [phone] to gicve diagnosis code

## 2017-07-22 ENCOUNTER — Other Ambulatory Visit: Payer: Medicare Other

## 2017-07-25 ENCOUNTER — Encounter: Payer: Self-pay | Admitting: Endocrinology

## 2017-07-25 ENCOUNTER — Ambulatory Visit (INDEPENDENT_AMBULATORY_CARE_PROVIDER_SITE_OTHER): Payer: Medicare Other | Admitting: Endocrinology

## 2017-07-25 VITALS — BP 140/78 | HR 68 | Ht 66.0 in | Wt 194.6 lb

## 2017-07-25 DIAGNOSIS — E1165 Type 2 diabetes mellitus with hyperglycemia: Secondary | ICD-10-CM

## 2017-07-25 DIAGNOSIS — Z794 Long term (current) use of insulin: Secondary | ICD-10-CM | POA: Diagnosis not present

## 2017-07-25 MED ORDER — GABAPENTIN 100 MG PO CAPS: 100.0000 mg | ORAL_CAPSULE | Freq: Three times a day (TID) | ORAL | 3 refills | Status: DC

## 2017-07-25 NOTE — Progress Notes (Signed)
Patient ID: Alec [name], male   DOB: [birth year], 71 y.o.   MRN: [id number]           Reason for Appointment:  Follow-up for Type 2 Diabetes  History of Present Illness:          Diagnosis: Type 2 diabetes mellitus, date of diagnosis: 2001       Prior history: He was seen in consultation in 11/2014 when his A1c was 7.4 Since his blood sugars were averaging over 200 he was given Victoza in addition to his basal bolus insulin regimen in 6/16 He started on the V go pump in October 2016  Recent history:    INSULIN regimen is described as:  V-go basal 40 units, mealtime clicks: 4-0-9/8  Non-insulin hypoglycemic drugs the patient is taking are: Victoza 1.2 mg daily   His A1c is now 6.6, Previous range 6.3-7.3  Current blood sugar patterns from analysis of his meter download, daily management and problems identified:  He was asked to start using the freestyle Libre system on his last visit for more complete information on his blood sugar patterns that he has had difficulty using this because of the sensor falling off and not getting good adhesion especially when active  Although his FASTING readings were higher on the last visit there are generally better now  He does have sporadic high readings after meals but not consistent  LOWEST blood sugars are late afternoon and before supper even with smaller insulin doses at lunch; this was also documented on his CGM  He was told to reduce his dosage to 4 clicks or 8 units at lunchtime but he still taking 5 clicks  Also now with the sensor he does appear to have mostly high readings after breakfast  Overnight blood sugars can be reviewed only on 2 nights on his sensor with near normal readings  A1c is higher than expected for his home blood sugar average of 125  CGM data not analyzed as he has readings only on 5 partial days until October 6  Side effects from medications have been:none  Compliance with the medical regimen: Good    Glucose monitoring:  freestyle Libre/One Touch       Blood Glucose readings by download:  Mean values apply above for all meters except median for One Touch  PRE-MEAL Fasting Lunch Dinner Bedtime Overall  Glucose range:  88-1 84   69-1 54  71-285    Mean/median: 126  155  87  99  122    Self-care: The diet that the patient has been following is: Relatively low fat     Meals: 3 meals per day. Lunch  1 pm Dinner 6 pm Breakfast is usually an egg sandwich  Lunch: mostly taking  a sandwich from home Eating a balanced meals in the evening.  He will have snacks on granola bars              Exercise: Sometimes active at work no programmed activity, doing some yardwork/Walking on weekends  Dietician visit, most recent: 6/15.               Weight history:  Wt Readings from Last 3 Encounters:  07/25/17 194 lb 9.6 oz (88.3 kg)  04/25/17 193 lb (87.5 kg)  04/17/17 193 lb 6.4 oz (87.7 kg)    Glycemic control:    Lab Results  Component Value Date   HGBA1C 6.6 (H) 07/21/2017   HGBA1C 6.3 04/04/2017   HGBA1C 7.3 (H)  01/01/2017   Lab Results  Component Value Date   MICROALBUR 5.2 (H) 07/21/2017   LDLCALC 54 03/28/2016   CREATININE 1.66 (H) 07/21/2017     Past history:  He was not symptomatic at time of diagnosis and was probably treated with Amaryl only.  No details are available of previous management However he thinks he was not given metformin at any time because of his "kidney problem" He had a A1c of over 9% in 2013 and may have been started on insulin at that time. Most likely has been taking Levemir and NovoLog since then A1c records show his results were over 8% in 2014 and in 4/15 but subsequently down to 7.1 in November 2015 Amaryl was stopped in 7/16  Other active problems are addressed in Review of systems   Allergies as of 07/25/2017   No Known Allergies     Medication List       Accurate as of 07/25/17 11:59 PM. Always use your most recent med list.           amLODipine 10 MG tablet Commonly known as:  NORVASC Take 10 mg by mouth daily.   aspirin 81 MG tablet Take 81 mg by mouth daily.   atorvastatin 20 MG tablet Commonly known as:  LIPITOR Take 20 mg by mouth daily.   Cinnamon 500 MG Tabs Take 1,000 mg by mouth daily.   fish oil-omega-3 fatty acids 1000 MG capsule Take 2 g by mouth daily.   gabapentin 100 MG capsule Commonly known as:  NEURONTIN Take 1 capsule (100 mg total) by mouth 3 (three) times daily.   insulin aspart 100 UNIT/ML injection Commonly known as:  NOVOLOG INJECT 85 UNITS SUBCUTANEOUSLY ONCE DAILY WITH  V-GO  PUMP Dx Code E11.65   lisinopril-hydrochlorothiazide 20-12.5 MG tablet Commonly known as:  PRINZIDE,ZESTORETIC Take 1 tablet by mouth daily.   multivitamin tablet Take 1 tablet by mouth daily.   rOPINIRole 0.5 MG tablet Commonly known as:  REQUIP TAKE ONE TABLET BY MOUTH AT BEDTIME   sertraline 100 MG tablet Commonly known as:  ZOLOFT Take 150 mg by mouth daily.   V-GO 40 Kit USE ONE PER DAY   VICTOZA 18 MG/3ML Sopn Generic drug:  liraglutide INJECT 1.2 MG  SUBCUTANEOUSLY ONCE DAILY AT THE SAME TIME   Vitamin D3 3000 units Tabs Take 1 tablet by mouth daily.       Allergies: No Known Allergies  Past Medical History:  Diagnosis Date  . Degenerative arthritis of shoulder region 08/2013   left  . Disorder of bursae and tendons in left shoulder region 08/2013  . Hyperlipidemia   . Hypertension    under control with meds., has been on med. > 10 yr.  . IDDM (insulin dependent diabetes mellitus) ([place])     Past Surgical History:  Procedure Laterality Date  . CHOLECYSTECTOMY  08/29/2012   Procedure: LAPAROSCOPIC CHOLECYSTECTOMY WITH INTRAOPERATIVE CHOLANGIOGRAM;  Surgeon: Imogene Burn. Georgette Dover, MD;  Location: [facility];  Service: General;  Laterality: N/A;  . SHOULDER ARTHROSCOPY WITH SUBACROMIAL DECOMPRESSION, ROTATOR CUFF REPAIR AND BICEP TENDON REPAIR Left 09/03/2013   Procedure: LEFT  SHOULDER ARTHROSCOPY WITH EXTENSIVE DEBRIDMENT, DISTAL CLAVICULECTOMY, ROTATOR CUFF REPAIR AND SUBACROMIAL DECOMPRESSION PARTIAL ACRIOMIOPLASTY WITH CORACOACROMIAL RELEASE;  Surgeon: Renette Butters, MD;  Location: [place];  Service: Orthopedics;  Laterality: Left;  . TRANSURETHRAL RESECTION OF BLADDER TUMOR WITH MITOMYCIN-C  08/24/2008    Family History  Problem Relation Age of Onset  . Diabetes Mother   .  Cancer Father   . Coronary artery disease Father        MI at age 6.   . Sudden death Son   . CAD Maternal Grandfather        MI in his 49's  . CAD Paternal Grandfather     Social History:  reports that he quit smoking about 19 years ago. He has never used smokeless tobacco. He reports that he does not drink alcohol or use drugs.    Review of Systems     HYPERTENSION: has been treated with amlodipine A half of 10 mg, and lisinopril HCT and also lisinopril.  Blood pressure also followed by nephrologist       Lipids: he is taking Lipitor 20 mg, last triglycerides and LDL are normal in 7/18 from his PCP with LDL 68       Lab Results  Component Value Date   CHOL 108 03/28/2016   HDL 28.80 (L) 03/28/2016   LDLCALC 54 03/28/2016   TRIG 130.0 03/28/2016   CHOLHDL 4 03/28/2016               CKD: He has been followed by nephrologist for diabetic nephropathy.   Previous microalbumin/creatinine ratio was 393 and in 4/15 his 24-hour urine protein was 538 Last urine microalbumin ratio is normal  His creatinine As follows:    Lab Results  Component Value Date   CREATININE 1.66 (H) 07/21/2017   BUN 21 07/21/2017   NA 137 07/21/2017   K 3.7 07/21/2017   CL 101 07/21/2017   CO2 28 07/21/2017    Last foot exam in 7/18  Have minimal neuropathic symptoms controlled with low-dose gabapentin, also on Requip from PCP  [facility] 1/18   Physical Examination:  BP 140/78   Pulse 68   Ht 5' 6" (1.676 m)   Wt 194 lb 9.6 oz (88.3 kg)   SpO2 97%   BMI 31.41  kg/m        ASSESSMENT:  Diabetes type 2, nonobese  See history of present illness for detailed discussion of his current management, blood sugar patterns and problems identified  A1c is Fairly good at 6.6 although has been better Since his CGM was not active for more than 4-5 partial days and recently not done difficult to know what his blood sugar patterns are He is generally doing well with the V-go pump good compliance for mealtime bolusing  Mostly has high readings after breakfast and probably requiring more insulin in the morning Possibly because of his being active during the day he is requiring less insulin at lunchtime but he has not reduced his boluses as discussed on her last visit Also probably has good readings after supper but enough readings are not available again   HYPERTENSION:  Blood pressure is controlled  RENAL insufficiency: Stable, likely to be hypertension related mostly    PLAN:    Continue V-go pump with 40 unit basal.  Reduce lunchtime bolus to 4 clicks instead of 5 unless he is less active on weekends  He will take 6 clicks at breakfast instead of 5  Also continue taking 7-8 clicks at dinnertime and may adjusted based on his activity level and type of meal he is getting  Discussed that he will get much more information when he was at the freestyle [place] sensor consistently  For this he will need to use an armband or use the [place]egad[place] sample which was given to him today  Discussed needing to check  blood sugar with the sensor at least 3-4 times a day including bedtime  He will call if he has unusual high or low blood sugar patterns  Encouraged him to be active with walking daily   Patient Instructions  Take 6 clicks at Bfst and only 4 at lunch and 7 at dinner  Add protein in am    Counseling time on subjects discussed in history, assessment and plan sections is over 50% of today's 25 minute visit  Flu vaccine was given on  10/18  [facility] 07/27/2017, 9:14 PM   Note: This office note was prepared with Estate agent. Any transcriptional errors that result from this process are unintentional.

## 2017-07-25 NOTE — Patient Instructions (Addendum)
Take 6 clicks at Bfst and only 4 at lunch and 7 at dinner  Add protein in am

## 2017-07-27 ENCOUNTER — Encounter: Payer: Self-pay | Admitting: Endocrinology

## 2017-07-29 ENCOUNTER — Ambulatory Visit: Payer: Medicare Other

## 2017-08-03 ENCOUNTER — Other Ambulatory Visit: Payer: Self-pay | Admitting: Endocrinology

## 2017-09-15 ENCOUNTER — Other Ambulatory Visit: Payer: Self-pay | Admitting: Endocrinology

## 2017-09-18 DIAGNOSIS — M7521 Bicipital tendinitis, right shoulder: Secondary | ICD-10-CM | POA: Diagnosis not present

## 2017-09-18 DIAGNOSIS — M75121 Complete rotator cuff tear or rupture of right shoulder, not specified as traumatic: Secondary | ICD-10-CM | POA: Diagnosis not present

## 2017-09-18 DIAGNOSIS — X58XXXA Exposure to other specified factors, initial encounter: Secondary | ICD-10-CM | POA: Diagnosis not present

## 2017-09-18 DIAGNOSIS — Y999 Unspecified external cause status: Secondary | ICD-10-CM | POA: Diagnosis not present

## 2017-09-18 DIAGNOSIS — M19011 Primary osteoarthritis, right shoulder: Secondary | ICD-10-CM | POA: Diagnosis not present

## 2017-09-18 DIAGNOSIS — S43431A Superior glenoid labrum lesion of right shoulder, initial encounter: Secondary | ICD-10-CM | POA: Diagnosis not present

## 2017-09-18 DIAGNOSIS — L905 Scar conditions and fibrosis of skin: Secondary | ICD-10-CM | POA: Diagnosis not present

## 2017-09-18 DIAGNOSIS — G8918 Other acute postprocedural pain: Secondary | ICD-10-CM | POA: Diagnosis not present

## 2017-09-18 DIAGNOSIS — M7551 Bursitis of right shoulder: Secondary | ICD-10-CM | POA: Diagnosis not present

## 2017-09-18 DIAGNOSIS — M24111 Other articular cartilage disorders, right shoulder: Secondary | ICD-10-CM | POA: Diagnosis not present

## 2017-09-18 DIAGNOSIS — M7541 Impingement syndrome of right shoulder: Secondary | ICD-10-CM | POA: Diagnosis not present

## 2017-09-24 DIAGNOSIS — M25611 Stiffness of right shoulder, not elsewhere classified: Secondary | ICD-10-CM | POA: Diagnosis not present

## 2017-09-24 DIAGNOSIS — M6281 Muscle weakness (generalized): Secondary | ICD-10-CM | POA: Diagnosis not present

## 2017-09-24 DIAGNOSIS — M25511 Pain in right shoulder: Secondary | ICD-10-CM | POA: Diagnosis not present

## 2017-09-26 DIAGNOSIS — M6281 Muscle weakness (generalized): Secondary | ICD-10-CM | POA: Diagnosis not present

## 2017-09-26 DIAGNOSIS — M25611 Stiffness of right shoulder, not elsewhere classified: Secondary | ICD-10-CM | POA: Diagnosis not present

## 2017-09-26 DIAGNOSIS — M25511 Pain in right shoulder: Secondary | ICD-10-CM | POA: Diagnosis not present

## 2017-09-29 DIAGNOSIS — M25511 Pain in right shoulder: Secondary | ICD-10-CM | POA: Diagnosis not present

## 2017-10-01 DIAGNOSIS — M25511 Pain in right shoulder: Secondary | ICD-10-CM | POA: Diagnosis not present

## 2017-10-01 DIAGNOSIS — M6281 Muscle weakness (generalized): Secondary | ICD-10-CM | POA: Diagnosis not present

## 2017-10-01 DIAGNOSIS — M25611 Stiffness of right shoulder, not elsewhere classified: Secondary | ICD-10-CM | POA: Diagnosis not present

## 2017-10-03 DIAGNOSIS — M6281 Muscle weakness (generalized): Secondary | ICD-10-CM | POA: Diagnosis not present

## 2017-10-03 DIAGNOSIS — M25611 Stiffness of right shoulder, not elsewhere classified: Secondary | ICD-10-CM | POA: Diagnosis not present

## 2017-10-03 DIAGNOSIS — M25511 Pain in right shoulder: Secondary | ICD-10-CM | POA: Diagnosis not present

## 2017-10-05 ENCOUNTER — Other Ambulatory Visit: Payer: Self-pay | Admitting: Endocrinology

## 2017-10-07 DIAGNOSIS — M6281 Muscle weakness (generalized): Secondary | ICD-10-CM | POA: Diagnosis not present

## 2017-10-07 DIAGNOSIS — M25611 Stiffness of right shoulder, not elsewhere classified: Secondary | ICD-10-CM | POA: Diagnosis not present

## 2017-10-07 DIAGNOSIS — M25511 Pain in right shoulder: Secondary | ICD-10-CM | POA: Diagnosis not present

## 2017-10-10 DIAGNOSIS — M25611 Stiffness of right shoulder, not elsewhere classified: Secondary | ICD-10-CM | POA: Diagnosis not present

## 2017-10-10 DIAGNOSIS — M6281 Muscle weakness (generalized): Secondary | ICD-10-CM | POA: Diagnosis not present

## 2017-10-10 DIAGNOSIS — M25511 Pain in right shoulder: Secondary | ICD-10-CM | POA: Diagnosis not present

## 2017-10-15 DIAGNOSIS — H11153 Pinguecula, bilateral: Secondary | ICD-10-CM | POA: Diagnosis not present

## 2017-10-15 DIAGNOSIS — M6281 Muscle weakness (generalized): Secondary | ICD-10-CM | POA: Diagnosis not present

## 2017-10-15 DIAGNOSIS — H2513 Age-related nuclear cataract, bilateral: Secondary | ICD-10-CM | POA: Diagnosis not present

## 2017-10-15 DIAGNOSIS — M25611 Stiffness of right shoulder, not elsewhere classified: Secondary | ICD-10-CM | POA: Diagnosis not present

## 2017-10-15 DIAGNOSIS — M25511 Pain in right shoulder: Secondary | ICD-10-CM | POA: Diagnosis not present

## 2017-10-15 DIAGNOSIS — H18413 Arcus senilis, bilateral: Secondary | ICD-10-CM | POA: Diagnosis not present

## 2017-10-15 DIAGNOSIS — E119 Type 2 diabetes mellitus without complications: Secondary | ICD-10-CM | POA: Diagnosis not present

## 2017-10-17 DIAGNOSIS — M25511 Pain in right shoulder: Secondary | ICD-10-CM | POA: Diagnosis not present

## 2017-10-17 DIAGNOSIS — M6281 Muscle weakness (generalized): Secondary | ICD-10-CM | POA: Diagnosis not present

## 2017-10-17 DIAGNOSIS — M25611 Stiffness of right shoulder, not elsewhere classified: Secondary | ICD-10-CM | POA: Diagnosis not present

## 2017-10-22 DIAGNOSIS — M6281 Muscle weakness (generalized): Secondary | ICD-10-CM | POA: Diagnosis not present

## 2017-10-22 DIAGNOSIS — M25511 Pain in right shoulder: Secondary | ICD-10-CM | POA: Diagnosis not present

## 2017-10-22 DIAGNOSIS — M25611 Stiffness of right shoulder, not elsewhere classified: Secondary | ICD-10-CM | POA: Diagnosis not present

## 2017-10-23 ENCOUNTER — Other Ambulatory Visit (INDEPENDENT_AMBULATORY_CARE_PROVIDER_SITE_OTHER): Payer: Medicare Other

## 2017-10-23 DIAGNOSIS — Z794 Long term (current) use of insulin: Secondary | ICD-10-CM

## 2017-10-23 DIAGNOSIS — E1165 Type 2 diabetes mellitus with hyperglycemia: Secondary | ICD-10-CM

## 2017-10-23 LAB — HEMOGLOBIN A1C: Hgb A1c MFr Bld: 7.5 % — ABNORMAL HIGH (ref 4.6–6.5)

## 2017-10-23 LAB — BASIC METABOLIC PANEL
BUN: 28 mg/dL — ABNORMAL HIGH (ref 6–23)
CO2: 29 mEq/L (ref 19–32)
Calcium: 9.9 mg/dL (ref 8.4–10.5)
Chloride: 101 mEq/L (ref 96–112)
Creatinine, Ser: 1.6 mg/dL — ABNORMAL HIGH (ref 0.40–1.50)
GFR: 45.74 mL/min — ABNORMAL LOW (ref >60.00)
Glucose, Bld: 152 mg/dL — ABNORMAL HIGH (ref 70–99)
Potassium: 3.8 mEq/L (ref 3.5–5.1)
Sodium: 138 mEq/L (ref 135–145)

## 2017-10-24 DIAGNOSIS — M25611 Stiffness of right shoulder, not elsewhere classified: Secondary | ICD-10-CM | POA: Diagnosis not present

## 2017-10-24 DIAGNOSIS — M25511 Pain in right shoulder: Secondary | ICD-10-CM | POA: Diagnosis not present

## 2017-10-24 DIAGNOSIS — M6281 Muscle weakness (generalized): Secondary | ICD-10-CM | POA: Diagnosis not present

## 2017-10-27 ENCOUNTER — Encounter: Payer: Self-pay | Admitting: Endocrinology

## 2017-10-27 ENCOUNTER — Other Ambulatory Visit: Payer: Self-pay

## 2017-10-27 ENCOUNTER — Ambulatory Visit (INDEPENDENT_AMBULATORY_CARE_PROVIDER_SITE_OTHER): Payer: Medicare Other | Admitting: Endocrinology

## 2017-10-27 VITALS — BP 134/68 | HR 61 | Ht 66.0 in | Wt 187.6 lb

## 2017-10-27 DIAGNOSIS — N2581 Secondary hyperparathyroidism of renal origin: Secondary | ICD-10-CM | POA: Diagnosis not present

## 2017-10-27 DIAGNOSIS — E1165 Type 2 diabetes mellitus with hyperglycemia: Secondary | ICD-10-CM | POA: Diagnosis not present

## 2017-10-27 DIAGNOSIS — E1122 Type 2 diabetes mellitus with diabetic chronic kidney disease: Secondary | ICD-10-CM | POA: Diagnosis not present

## 2017-10-27 DIAGNOSIS — E782 Mixed hyperlipidemia: Secondary | ICD-10-CM | POA: Diagnosis not present

## 2017-10-27 DIAGNOSIS — N183 Chronic kidney disease, stage 3 (moderate): Secondary | ICD-10-CM | POA: Diagnosis not present

## 2017-10-27 DIAGNOSIS — R0683 Snoring: Secondary | ICD-10-CM | POA: Diagnosis not present

## 2017-10-27 DIAGNOSIS — M25511 Pain in right shoulder: Secondary | ICD-10-CM | POA: Diagnosis not present

## 2017-10-27 DIAGNOSIS — Z794 Long term (current) use of insulin: Secondary | ICD-10-CM | POA: Diagnosis not present

## 2017-10-27 DIAGNOSIS — F411 Generalized anxiety disorder: Secondary | ICD-10-CM | POA: Diagnosis not present

## 2017-10-27 DIAGNOSIS — I129 Hypertensive chronic kidney disease with stage 1 through stage 4 chronic kidney disease, or unspecified chronic kidney disease: Secondary | ICD-10-CM | POA: Diagnosis not present

## 2017-10-27 MED ORDER — INSULIN ASPART 100 UNIT/ML ~~LOC~~ SOLN: SUBCUTANEOUS | 3 refills | Status: DC

## 2017-10-27 MED ORDER — VICTOZA 18 MG/3ML ~~LOC~~ SOPN: PEN_INJECTOR | SUBCUTANEOUS | 1 refills | Status: DC

## 2017-10-27 NOTE — Progress Notes (Signed)
Patient ID: Alec [name], male   DOB: 1946/06/07, 72 y.o.   MRN: [id number]             Reason for Appointment:  Follow-up for Type 2 Diabetes  History of Present Illness:          Diagnosis: Type 2 diabetes mellitus, date of diagnosis: 2001       Prior history: He was seen in consultation in 11/2014 when his A1c was 7.4 Since his blood sugars were averaging over 200 he was given Victoza in addition to his basal bolus insulin regimen in 6/16 He started on the V go pump in October 2016  Recent history:    INSULIN regimen is described as:  V-go basal 40 units, mealtime clicks: 4-7-4/2  Non-insulin hypoglycemic drugs the patient is taking are: Victoza 1.2 mg daily   His A1c is now 7.5, compared to 6.6 on his last visit in October Also A1c appears to be gradually increasing  Current blood sugar patterns from analysis of his meter download, daily management and problems identified:  He was not able to get his freestyle Elenor Legato used consistently because of technical problems and only has blood sugars for about a week now  Also he is not checking his blood sugars in the evenings and very little information available about his blood sugars between 4 PM and 11 PM  He had prednisone in December for gout and his blood sugars may have gone up higher at that time  Also because of his shoulder surgery in late December he has been much less active  Also at times is eating out more especially breakfast  Currently HIGHEST blood sugars are in the midday and early afternoon, some probably after late breakfast  Also not clear why his OVERNIGHT blood sugars are mostly high with only a couple of days with good readings  Lowest readings overall are fasting on an average  Surprisingly however his weight has gone down  Side effects from medications have been:none  Compliance with the medical regimen: Good   Glucose monitoring:  freestyle Libre         Blood Glucose readings and averages by download:  Mean values apply above for all meters except median for One Touch  PRE-MEAL Fasting Lunch Dinner Overnight  Overall  Glucose range:       Mean/median: 139    168  168   POST-MEAL PC Breakfast PC Lunch PC Dinner  Glucose range:     Mean/median: 180   199  ?      Self-care: The diet that the patient has been following is:  variable recently  Meals: 3 meals per day. Lunch  1 pm Dinner 6 pm Breakfast is usually an egg sandwichbut sometimes biscuits   Eating a balanced meals in the evening.  He will have snacks on granola bars              Exercise: minimal recently  Dietician visit, most recent: 6/15.               Weight history:  Wt Readings from Last 3 Encounters:  10/27/17 187 lb 9.6 oz (85.1 kg)  07/25/17 194 lb 9.6 oz (88.3 kg)  04/25/17 193 lb (87.5 kg)    Glycemic control:    Lab Results  Component Value Date   HGBA1C 7.5 (H) 10/23/2017   HGBA1C 6.6 (H) 07/21/2017   HGBA1C 6.3 04/04/2017   Lab Results  Component Value Date   MICROALBUR 5.2 (  H) 07/21/2017   LDLCALC 54 03/28/2016   CREATININE 1.60 (H) 10/23/2017     Past history:  He was not symptomatic at time of diagnosis and was probably treated with Amaryl only.  No details are available of previous management However he thinks he was not given metformin at any time because of his "kidney problem" He had a A1c of over 9% in 2013 and may have been started on insulin at that time. Most likely has been taking Levemir and NovoLog since then A1c records show his results were over 8% in 2014 and in 4/15 but subsequently down to 7.1 in November 2015 Amaryl was stopped in 7/16  Other active problems are addressed in Review of systems   Allergies as of 10/27/2017   No Known Allergies     Medication List        Accurate as of 10/27/17  8:16 AM. Always use your most recent med list.          amLODipine 10 MG tablet Commonly known as:  NORVASC Take 10  mg by mouth daily.   aspirin 81 MG tablet Take 81 mg by mouth daily.   atorvastatin 20 MG tablet Commonly known as:  LIPITOR Take 20 mg by mouth daily.   Cinnamon 500 MG Tabs Take 1,000 mg by mouth daily.   fish oil-omega-3 fatty acids 1000 MG capsule Take 2 g by mouth daily.   gabapentin 100 MG capsule Commonly known as:  NEURONTIN Take 1 capsule (100 mg total) by mouth 3 (three) times daily.   insulin aspart 100 UNIT/ML injection Commonly known as:  NOVOLOG INJECT 85 UNITS SUBCUTANEOUSLY ONCE DAILY WITH  V-GO  PUMP Dx Code E11.65   lisinopril-hydrochlorothiazide 20-12.5 MG tablet Commonly known as:  PRINZIDE,ZESTORETIC Take 1 tablet by mouth daily.   multivitamin tablet Take 1 tablet by mouth daily.   rOPINIRole 0.5 MG tablet Commonly known as:  REQUIP TAKE 1 TABLET BY MOUTH AT BEDTIME   sertraline 100 MG tablet Commonly known as:  ZOLOFT Take 150 mg by mouth daily.   V-GO 40 Kit USE ONE PER DAY   VICTOZA 18 MG/3ML Sopn Generic drug:  liraglutide INJECT  1.2 MG SUBCUTANEOUSLY ONCE DAILY AT THE SAME TIME EVERYDAY   Vitamin D3 3000 units Tabs Take 1 tablet by mouth daily.       Allergies: No Known Allergies  Past Medical History:  Diagnosis Date  . Degenerative arthritis of shoulder region 08/2013   left  . Disorder of bursae and tendons in left shoulder region 08/2013  . Hyperlipidemia   . Hypertension    under control with meds., has been on med. > 10 yr.  . IDDM (insulin dependent diabetes mellitus) ([place])     Past Surgical History:  Procedure Laterality Date  . CHOLECYSTECTOMY  08/29/2012   Procedure: LAPAROSCOPIC CHOLECYSTECTOMY WITH INTRAOPERATIVE CHOLANGIOGRAM;  Surgeon: Imogene Burn. Georgette Dover, MD;  Location: [facility];  Service: General;  Laterality: N/A;  . SHOULDER ARTHROSCOPY WITH SUBACROMIAL DECOMPRESSION, ROTATOR CUFF REPAIR AND BICEP TENDON REPAIR Left 09/03/2013   Procedure: LEFT SHOULDER ARTHROSCOPY WITH EXTENSIVE DEBRIDMENT, DISTAL  CLAVICULECTOMY, ROTATOR CUFF REPAIR AND SUBACROMIAL DECOMPRESSION PARTIAL ACRIOMIOPLASTY WITH CORACOACROMIAL RELEASE;  Surgeon: Renette Butters, MD;  Location: [place];  Service: Orthopedics;  Laterality: Left;  . TRANSURETHRAL RESECTION OF BLADDER TUMOR WITH MITOMYCIN-C  08/24/2008    Family History  Problem Relation Age of Onset  . Diabetes Mother   . Cancer Father   . Coronary artery  disease Father        MI at age 75.   . Sudden death Son   . CAD Maternal Grandfather        MI in his 46's  . CAD Paternal Grandfather     Social History:  reports that he quit smoking about 20 years ago. he has never used smokeless tobacco. He reports that he does not drink alcohol or use drugs.    Review of Systems     HYPERTENSION: has been treated with amlodipine 5 mg, and lisinopril HCT and also lisinopril.  Blood pressure also followed by nephrologist       Lipids: he is taking Lipitor 20 mg, last triglycerides and LDL are normal in 7/18 from his PCP with LDL 68       Lab Results  Component Value Date   CHOL 108 03/28/2016   HDL 28.80 (L) 03/28/2016   LDLCALC 54 03/28/2016   TRIG 130.0 03/28/2016   CHOLHDL 4 03/28/2016               CKD: He has been followed by nephrologist for diabetic nephropathy.   Previous microalbumin/creatinine ratio was 393 and in 4/15 his 24-hour urine protein was 538 Last urine microalbumin ratio is normal  His creatinine is follows:  Lab Results  Component Value Date   CREATININE 1.60 (H) 10/23/2017   CREATININE 1.66 (H) 07/21/2017   CREATININE 1.63 (H) 04/04/2017     Last foot exam in 7/18  Has neuropathic symptoms controlled with low-dose gabapentin, also on Requip from PCP  Eye exam last:  1/18   Physical Examination:  BP 134/68   Pulse 61   Ht '5\' 6"'$  (1.676 m)   Wt 187 lb 9.6 oz (85.1 kg)   BMI 30.28 kg/m        ASSESSMENT:  Diabetes type 2, insulin requiring  See history of present illness for detailed  discussion of his current management, blood sugar patterns and problems identified  A1c is higher than usual at 7.5, previously 6.6  This is likely from a combination of inactivity, recent surgery, use of prednisone, higher fat meals Currently blood sugars are averaging 168 at home on his CGM but he has incomplete data  Discussed despite also combining with Victoza 1.2 mg daily Currently not having adequate blood sugar patterns available because of not checking his blood sugars at various times especially at night Blood sugars are not consistently high overnight but not clear if higher readings may be related to increased snacks at night or higher fat meals    HYPERTENSION:  Blood pressure is controlled with his current regimen of multiple medications and also followed by nephrologist  RENAL insufficiency: Stable, likely to be hypertension related rather than diabetes    PLAN:    He will need to look at the Omnipod pump for better efficacy and insulin capacity  Discussed in detail how this is different than the V-go pump and how it is used  He will get the paperwork started for this today and given him a sample And brochure  He will need to start cutting back on high-fat meals especially in the morning and also other times  To take at least 6 clicks in the morning at breakfast  Start physical activity with walking as tolerated  Encouraged to see the dietitian also, also probably would benefit from knowing how to count carbohydrates  More blood sugar testing later at night since he is not doing any monitoring after  supper  Increased Victoza up to 1.8 mg for better efficacy   There are no Patient Instructions on file for this visit.   Counseling time on subjects discussed in history, assessment and plan sections is over 50% of today's 25 minute visit   Elayne Snare 10/27/2017, 8:16 AM   Note: This office note was prepared with Dragon voice recognition system technology. Any  transcriptional errors that result from this process are unintentional.

## 2017-10-27 NOTE — Patient Instructions (Addendum)
Victoza 1.8 mg daily  Low fat meals  6 clicks in am  More testing at bedtime  Walk daily

## 2017-10-29 DIAGNOSIS — M25611 Stiffness of right shoulder, not elsewhere classified: Secondary | ICD-10-CM | POA: Diagnosis not present

## 2017-10-29 DIAGNOSIS — M25511 Pain in right shoulder: Secondary | ICD-10-CM | POA: Diagnosis not present

## 2017-10-29 DIAGNOSIS — C61 Malignant neoplasm of prostate: Secondary | ICD-10-CM | POA: Diagnosis not present

## 2017-10-29 DIAGNOSIS — M6281 Muscle weakness (generalized): Secondary | ICD-10-CM | POA: Diagnosis not present

## 2017-10-31 DIAGNOSIS — M6281 Muscle weakness (generalized): Secondary | ICD-10-CM | POA: Diagnosis not present

## 2017-10-31 DIAGNOSIS — M25611 Stiffness of right shoulder, not elsewhere classified: Secondary | ICD-10-CM | POA: Diagnosis not present

## 2017-10-31 DIAGNOSIS — M25511 Pain in right shoulder: Secondary | ICD-10-CM | POA: Diagnosis not present

## 2017-11-03 DIAGNOSIS — C61 Malignant neoplasm of prostate: Secondary | ICD-10-CM | POA: Diagnosis not present

## 2017-11-03 DIAGNOSIS — C672 Malignant neoplasm of lateral wall of bladder: Secondary | ICD-10-CM | POA: Diagnosis not present

## 2017-11-03 DIAGNOSIS — N5201 Erectile dysfunction due to arterial insufficiency: Secondary | ICD-10-CM | POA: Diagnosis not present

## 2017-11-05 DIAGNOSIS — M25611 Stiffness of right shoulder, not elsewhere classified: Secondary | ICD-10-CM | POA: Diagnosis not present

## 2017-11-05 DIAGNOSIS — M25511 Pain in right shoulder: Secondary | ICD-10-CM | POA: Diagnosis not present

## 2017-11-05 DIAGNOSIS — M6281 Muscle weakness (generalized): Secondary | ICD-10-CM | POA: Diagnosis not present

## 2017-11-07 DIAGNOSIS — M25511 Pain in right shoulder: Secondary | ICD-10-CM | POA: Diagnosis not present

## 2017-11-07 DIAGNOSIS — M6281 Muscle weakness (generalized): Secondary | ICD-10-CM | POA: Diagnosis not present

## 2017-11-07 DIAGNOSIS — M25611 Stiffness of right shoulder, not elsewhere classified: Secondary | ICD-10-CM | POA: Diagnosis not present

## 2017-11-10 ENCOUNTER — Telehealth: Payer: Self-pay

## 2017-11-10 ENCOUNTER — Telehealth: Payer: Self-pay | Admitting: Endocrinology

## 2017-11-10 NOTE — Telephone Encounter (Signed)
Patient called our office and he is out of VGo 40 insulin pumps and I have sent a message to [facility] to schedule him for his Omnipod Insulin Pump start and I have left 2 VGo 40 kits up front for him to pick up until they can schedule him for his pump start.

## 2017-11-12 DIAGNOSIS — M25511 Pain in right shoulder: Secondary | ICD-10-CM | POA: Diagnosis not present

## 2017-11-12 DIAGNOSIS — M6281 Muscle weakness (generalized): Secondary | ICD-10-CM | POA: Diagnosis not present

## 2017-11-12 DIAGNOSIS — M25611 Stiffness of right shoulder, not elsewhere classified: Secondary | ICD-10-CM | POA: Diagnosis not present

## 2017-11-14 DIAGNOSIS — M25511 Pain in right shoulder: Secondary | ICD-10-CM | POA: Diagnosis not present

## 2017-11-14 DIAGNOSIS — M25611 Stiffness of right shoulder, not elsewhere classified: Secondary | ICD-10-CM | POA: Diagnosis not present

## 2017-11-14 DIAGNOSIS — M6281 Muscle weakness (generalized): Secondary | ICD-10-CM | POA: Diagnosis not present

## 2017-11-18 ENCOUNTER — Telehealth: Payer: Self-pay | Admitting: Family Medicine

## 2017-11-18 ENCOUNTER — Other Ambulatory Visit: Payer: Self-pay

## 2017-11-18 ENCOUNTER — Telehealth: Payer: Self-pay | Admitting: Nutrition

## 2017-11-18 ENCOUNTER — Telehealth: Payer: Self-pay

## 2017-11-18 MED ORDER — V-GO 40 KIT: PACK | 3 refills | Status: DC

## 2017-11-18 NOTE — Telephone Encounter (Signed)
Patient has been approved through [employer] for the V-Go 40 from 09/30/17 until 10/23/18- patient has been notified

## 2017-11-18 NOTE — Telephone Encounter (Signed)
Spoke with the patient and told him about his approval for V-Go but patient stated he is switching to [employer]

## 2017-11-18 NOTE — Telephone Encounter (Signed)
Patient is requesting Alec [name] to call him back. He has a question about his medication.

## 2017-11-18 NOTE — Telephone Encounter (Signed)
Patient was called .

## 2017-11-18 NOTE — Telephone Encounter (Signed)
Need to send a prescription for this patient's Novolog insulin to [facility] in [place]. He will be starting on OmniPod next week.   It will be filed under part B so will need ERx with max daily dose of pump and diagnosis code   Please fax script to [phone]

## 2017-11-19 DIAGNOSIS — M25511 Pain in right shoulder: Secondary | ICD-10-CM | POA: Diagnosis not present

## 2017-11-19 DIAGNOSIS — M6281 Muscle weakness (generalized): Secondary | ICD-10-CM | POA: Diagnosis not present

## 2017-11-19 DIAGNOSIS — M25611 Stiffness of right shoulder, not elsewhere classified: Secondary | ICD-10-CM | POA: Diagnosis not present

## 2017-11-19 NOTE — Telephone Encounter (Signed)
Need to know specifically which [facility].  Please send prescription for NovoLog vials, directions 80 units in the pump daily

## 2017-11-20 ENCOUNTER — Other Ambulatory Visit: Payer: Self-pay

## 2017-11-20 ENCOUNTER — Telehealth: Payer: Self-pay | Admitting: Dietician

## 2017-11-20 MED ORDER — INSULIN ASPART 100 UNIT/ML ~~LOC~~ SOLN: SUBCUTANEOUS | 3 refills | Status: DC

## 2017-11-20 NOTE — Telephone Encounter (Signed)
Called patient and he stated that he has called in a refill for Novolog at [facility] and he stated that he was told by the [facility] in [place] that he needed to get his insulin through them so they could charge to his Part B Medicare so that he would not have to pay for it. The number to [facility] is [phone]. He is going to call them to find out what he needs to do and call us back to let us know what we need to do for his insulin.

## 2017-11-20 NOTE — Telephone Encounter (Signed)
Patient called back and stated that he wants [place] to send a print prescription to the [facility] fax number provided so they can help him get better coverage on his Novolog insulin.

## 2017-11-21 DIAGNOSIS — M25611 Stiffness of right shoulder, not elsewhere classified: Secondary | ICD-10-CM | POA: Diagnosis not present

## 2017-11-21 DIAGNOSIS — M6281 Muscle weakness (generalized): Secondary | ICD-10-CM | POA: Diagnosis not present

## 2017-11-21 DIAGNOSIS — M25511 Pain in right shoulder: Secondary | ICD-10-CM | POA: Diagnosis not present

## 2017-11-24 ENCOUNTER — Encounter: Payer: Medicare Other | Attending: Endocrinology | Admitting: Nutrition

## 2017-11-24 DIAGNOSIS — Z794 Long term (current) use of insulin: Principal | ICD-10-CM

## 2017-11-24 DIAGNOSIS — E119 Type 2 diabetes mellitus without complications: Principal | ICD-10-CM

## 2017-11-24 DIAGNOSIS — IMO0001 Reserved for inherently not codable concepts without codable children: Secondary | ICD-10-CM

## 2017-11-24 NOTE — Patient Instructions (Addendum)
Test blood sugars before meals and at bedtime. Call pump 800 number if questions or problems Call office if blood sugars drop below 70, or remain above 250

## 2017-11-24 NOTE — Progress Notes (Signed)
Patient and his wife are here to learn how to use the OmniPod insulin pump.  He is currectly wearing a Freestyle sensor.   He was trained on how to fill, apply and use the OmniPod.  Settings were put into his pump per Dr. [name] Derby orders: Basal rate: MN: 1.5, 6AM: 1.7,  I/C: 10, ISF: 50,  Target 100 (per patient's request), with correctly over 100, timing: 4 hours.  Max basal rate: 3.0, Max bolus: 20u.  Fixed carb boluses:  Bfast: 120carbs (12u),  Lunch: 120 carbs,  Supper: 120 carbs, snack: 40 carbs.  He redemonstrated how to give a bolus X3,  and reported good understanding of this.  Written instructions were given for this.  His PDM would not allow for a carb bolus for the next 4 hours, so he was shown how to give a 12u bolus for his lunch meal.  Written instructions were give for this. Wife says he snacks during the day and evening, and it was stressed of the need to bolus for any snacks eaten.  He agreed to do this.   He filled the pod with Novolog insulin, and attached it to his upper left abdomen.  He had much skin redness and itching when removing his V-Go.  He was given samples of skin barriers to try to help with this.   They had no final questions.

## 2017-11-25 ENCOUNTER — Encounter: Payer: Self-pay | Admitting: Endocrinology

## 2017-11-25 ENCOUNTER — Encounter: Payer: Medicare Other | Admitting: Nutrition

## 2017-11-25 ENCOUNTER — Ambulatory Visit (INDEPENDENT_AMBULATORY_CARE_PROVIDER_SITE_OTHER): Payer: Medicare Other | Admitting: Endocrinology

## 2017-11-25 ENCOUNTER — Other Ambulatory Visit: Payer: Self-pay

## 2017-11-25 VITALS — BP 140/80 | HR 63 | Ht 66.0 in | Wt 190.4 lb

## 2017-11-25 DIAGNOSIS — Z794 Long term (current) use of insulin: Secondary | ICD-10-CM | POA: Diagnosis not present

## 2017-11-25 DIAGNOSIS — E1121 Type 2 diabetes mellitus with diabetic nephropathy: Secondary | ICD-10-CM

## 2017-11-25 DIAGNOSIS — E1165 Type 2 diabetes mellitus with hyperglycemia: Secondary | ICD-10-CM | POA: Diagnosis not present

## 2017-11-25 MED ORDER — GLUCOSE BLOOD VI STRP: ORAL_STRIP | 3 refills | Status: DC

## 2017-11-25 NOTE — Progress Notes (Signed)
Patient ID: Alec [name], male   DOB: 1946/08/16, 72 y.o.   MRN: [id number]             Reason for Appointment:  Follow-up for Type 2 Diabetes  History of Present Illness:          Diagnosis: Type 2 diabetes mellitus, date of diagnosis: 2001       Prior history: He was seen in consultation in 11/2014 when his A1c was 7.4 Since his blood sugars were averaging over 200 he was given Victoza in addition to his basal bolus insulin regimen in 6/16 He started on the V go pump in October 2016  Recent history:    INSULIN regimen is described as:  BASAL rate on Omnipod pump midnight = 1.5 and 6 AM = 1.7 Boluses 12 units before meals Previous regimen: V-go basal 40 units, mealtime clicks: 4-0-9/8  Non-insulin hypoglycemic drugs the patient is taking are: Victoza 1.2 mg daily   His A1c in 1/19 was 7.5, compared to 6.6 on his last visit in October  Current blood sugar patterns from analysis of his meter download, daily management and problems identified:  He is now starting the insulin pump with the Omnipod instead of the V-go as of 2/25  His blood sugar was high at suppertime because he was not able to bolus for lunch right after starting the pump, glucose was 252 before eating  However it was 181 right after eating and 174 at about 9 PM; he took 12 units bolus as programmed with fixed boluses using 1:10 carb ratio  Without any extra snack his blood sugar was 215 this morning even though his basal rate was active overnight  Not clear if he got his with boluses this morning and the first bolus of 7.7 units was suspended a few minutes later and subsequently got 10 units  With this his blood sugar after breakfast was 252  He still has not been able to use a FreeStyle libre sensor because of problems with the prescription and not clear what the issues  Previously with his V-go pump he would have high readings overnight but better fasting, average  blood sugar on his previous download was about 160  Side effects from medications have been:none  Compliance with the medical regimen: Good   Glucose monitoring:  freestyle Libre/Freestyle       Blood Glucose readings as above    Self-care: The diet that the patient has been following is:  variable recently  Meals: 3 meals per day. Lunch  1 pm Dinner 6 pm Breakfast is usually an egg sandwichbut sometimes biscuits   Eating a balanced meals in the evening.  He will have snacks on granola bars              Exercise: minimal recently  Dietician visit, most recent: 6/15.               Weight history:  Wt Readings from Last 3 Encounters:  11/25/17 190 lb 6.4 oz (86.4 kg)  10/27/17 187 lb 9.6 oz (85.1 kg)  07/25/17 194 lb 9.6 oz (88.3 kg)    Glycemic control:    Lab Results  Component Value Date   HGBA1C 7.5 (H) 10/23/2017   HGBA1C 6.6 (H) 07/21/2017   HGBA1C 6.3 04/04/2017   Lab Results  Component Value Date   MICROALBUR 5.2 (H) 07/21/2017   LDLCALC 54 03/28/2016   CREATININE 1.60 (H) 10/23/2017     Past history:  He was  not symptomatic at time of diagnosis and was probably treated with Amaryl only.  No details are available of previous management However he thinks he was not given metformin at any time because of his "kidney problem" He had a A1c of over 9% in 2013 and may have been started on insulin at that time. Most likely has been taking Levemir and NovoLog since then A1c records show his results were over 8% in 2014 and in 4/15 but subsequently down to 7.1 in November 2015 Amaryl was stopped in 7/16  Other active problems are addressed in Review of systems   Allergies as of 11/25/2017   No Known Allergies     Medication List        Accurate as of 11/25/17 11:34 AM. Always use your most recent med list.          amLODipine 10 MG tablet Commonly known as:  NORVASC Take 10 mg by mouth daily.   aspirin 81 MG tablet Take 81 mg by mouth daily.     atorvastatin 20 MG tablet Commonly known as:  LIPITOR Take 20 mg by mouth daily.   Cinnamon 500 MG Tabs Take 1,000 mg by mouth daily.   fish oil-omega-3 fatty acids 1000 MG capsule Take 2 g by mouth daily.   gabapentin 100 MG capsule Commonly known as:  NEURONTIN Take 1 capsule (100 mg total) by mouth 3 (three) times daily.   glucose blood test strip Commonly known as:  FREESTYLE LITE Use as instructed to test blood sugars 3 times daily.   insulin aspart 100 UNIT/ML injection Commonly known as:  NOVOLOG INJECT 85 UNITS SUBCUTANEOUSLY ONCE DAILY WITH  INSULIN  PUMP Dx Code E11.65   lisinopril-hydrochlorothiazide 20-12.5 MG tablet Commonly known as:  PRINZIDE,ZESTORETIC Take 1 tablet by mouth daily.   multivitamin tablet Take 1 tablet by mouth daily.   rOPINIRole 0.5 MG tablet Commonly known as:  REQUIP TAKE 1 TABLET BY MOUTH AT BEDTIME   sertraline 100 MG tablet Commonly known as:  ZOLOFT Take 150 mg by mouth daily.   V-GO 40 Kit USE ONE PER DAY   VICTOZA 18 MG/3ML Sopn Generic drug:  liraglutide INJECT  1.8 MG SUBCUTANEOUSLY ONCE DAILY AT THE SAME TIME EVERYDAY   Vitamin D3 3000 units Tabs Take 1 tablet by mouth daily.       Allergies: No Known Allergies  Past Medical History:  Diagnosis Date  . Degenerative arthritis of shoulder region 08/2013   left  . Disorder of bursae and tendons in left shoulder region 08/2013  . Hyperlipidemia   . Hypertension    under control with meds., has been on med. > 10 yr.  . IDDM (insulin dependent diabetes mellitus) ([place])     Past Surgical History:  Procedure Laterality Date  . CHOLECYSTECTOMY  08/29/2012   Procedure: LAPAROSCOPIC CHOLECYSTECTOMY WITH INTRAOPERATIVE CHOLANGIOGRAM;  Surgeon: Imogene Burn. Georgette Dover, MD;  Location: [facility];  Service: General;  Laterality: N/A;  . SHOULDER ARTHROSCOPY WITH SUBACROMIAL DECOMPRESSION, ROTATOR CUFF REPAIR AND BICEP TENDON REPAIR Left 09/03/2013   Procedure: LEFT SHOULDER  ARTHROSCOPY WITH EXTENSIVE DEBRIDMENT, DISTAL CLAVICULECTOMY, ROTATOR CUFF REPAIR AND SUBACROMIAL DECOMPRESSION PARTIAL ACRIOMIOPLASTY WITH CORACOACROMIAL RELEASE;  Surgeon: Renette Butters, MD;  Location: [place];  Service: Orthopedics;  Laterality: Left;  . TRANSURETHRAL RESECTION OF BLADDER TUMOR WITH MITOMYCIN-C  08/24/2008    Family History  Problem Relation Age of Onset  . Diabetes Mother   . Cancer Father   . Coronary artery  disease Father        MI at age 71.   . Sudden death Son   . CAD Maternal Grandfather        MI in his 56's  . CAD Paternal Grandfather     Social History:  reports that he quit smoking about 20 years ago. he has never used smokeless tobacco. He reports that he does not drink alcohol or use drugs.    Review of Systems   Following is a copy of previous note    HYPERTENSION: has been treated with amlodipine 5 mg, and lisinopril HCT and also lisinopril.  Blood pressure also followed by nephrologist       Lipids: he is taking Lipitor 20 mg, last triglycerides and LDL are normal in 7/18 from his PCP with LDL 68       Lab Results  Component Value Date   CHOL 108 03/28/2016   HDL 28.80 (L) 03/28/2016   LDLCALC 54 03/28/2016   TRIG 130.0 03/28/2016   CHOLHDL 4 03/28/2016               CKD: He has been followed by nephrologist for diabetic nephropathy.   Previous microalbumin/creatinine ratio was 393 and in 4/15 his 24-hour urine protein was 538 Last urine microalbumin ratio is normal  His creatinine is follows:  Lab Results  Component Value Date   CREATININE 1.60 (H) 10/23/2017   CREATININE 1.66 (H) 07/21/2017   CREATININE 1.63 (H) 04/04/2017     Last foot exam in 7/18  Has neuropathic symptoms controlled with low-dose gabapentin, also on Requip from PCP  Eye exam last:  1/18   Physical Examination:  BP 140/80 (BP Location: Left Arm, Patient Position: Sitting, Cuff Size: Normal)   Pulse 63   Ht '5\' 6"'$  (1.676 m)    Wt 190 lb 6.4 oz (86.4 kg)   SpO2 96%   BMI 30.73 kg/m        ASSESSMENT:  Diabetes type 2, insulin requiring  See history of present illness for detailed discussion of his current management, blood sugar patterns and problems identified  Although he has been on the Omnipod insulin pump he does not have enough information to adjust his settings except he appears to need a higher basal rate at least overnight Currently not using the freestyle Libre sensor He is currently getting a total of about 40 units basal similar to the V-go pump but more during the daytime His bolus of 12 units was adequate last evening for his meal but blood sugar was still relatively high after breakfast today   PLAN:    He will discussed the bolus technique with the nurse educator since he had difficulty this morning  Basal rate changes: Midnight = 1.8 and 8 AM = 1.7  He will have a correction factor as before of 1:50 with target 120  Same boluses for now  Review management tomorrow again   There are no Patient Instructions on file for this visit.     Elayne Snare 11/25/2017, 11:34 AM   Note: This office note was prepared with Dragon voice recognition system technology. Any transcriptional errors that result from this process are unintentional.

## 2017-11-25 NOTE — Patient Instructions (Signed)
New settings

## 2017-11-26 ENCOUNTER — Other Ambulatory Visit: Payer: Self-pay

## 2017-11-26 ENCOUNTER — Encounter: Payer: Medicare Other | Admitting: Nutrition

## 2017-11-26 ENCOUNTER — Ambulatory Visit (INDEPENDENT_AMBULATORY_CARE_PROVIDER_SITE_OTHER): Payer: Medicare Other | Admitting: Endocrinology

## 2017-11-26 ENCOUNTER — Encounter: Payer: Self-pay | Admitting: Endocrinology

## 2017-11-26 VITALS — BP 156/92 | HR 60 | Ht 66.0 in | Wt 189.8 lb

## 2017-11-26 DIAGNOSIS — E1165 Type 2 diabetes mellitus with hyperglycemia: Secondary | ICD-10-CM

## 2017-11-26 DIAGNOSIS — M25511 Pain in right shoulder: Secondary | ICD-10-CM | POA: Diagnosis not present

## 2017-11-26 DIAGNOSIS — Z794 Long term (current) use of insulin: Secondary | ICD-10-CM

## 2017-11-26 DIAGNOSIS — M25611 Stiffness of right shoulder, not elsewhere classified: Secondary | ICD-10-CM | POA: Diagnosis not present

## 2017-11-26 DIAGNOSIS — M6281 Muscle weakness (generalized): Secondary | ICD-10-CM | POA: Diagnosis not present

## 2017-11-26 DIAGNOSIS — E1121 Type 2 diabetes mellitus with diabetic nephropathy: Secondary | ICD-10-CM

## 2017-11-26 MED ORDER — GLUCOSE BLOOD VI STRP: ORAL_STRIP | 3 refills | Status: DC

## 2017-11-26 NOTE — Progress Notes (Signed)
Patient ID: Alec [name], male   DOB: August 01, 1946, 72 y.o.   MRN: [id number]             Reason for Appointment:  Follow-up for Type 2 Diabetes  History of Present Illness:          Diagnosis: Type 2 diabetes mellitus, date of diagnosis: 2001       Prior history: He was seen in consultation in 11/2014 when his A1c was 7.4 Since his blood sugars were averaging over 200 he was given Victoza in addition to his basal bolus insulin regimen in 6/16 He started on the V go pump in October 2016  Recent history:    INSULIN regimen is described as:  BASAL rate on Omnipod pump midnight = 1.8 and 6 AM = 1.7 Boluses 12 units before meals  Previous regimen: V-go basal 40 units, mealtime clicks: 2-9-9/3  Non-insulin hypoglycemic drugs the patient is taking are: Victoza 1.2 mg daily   His A1c in 1/19 was 7.5, compared to 6.6 on his last visit in October  Current blood sugar patterns from analysis of his meter download, daily management and problems identified:  He is on the Omnipod instead of the V-go as of 2/25  Despite increasing his basal rates overnight he still has readings over 200 his morning  Also except for at lunchtime his blood sugars were over 200 yesterday also  Not clear why he is needing more insulin than when he was in the V-go pump, currently getting a daily basis about 42 units compared to 40 units on the V-go  He has not had any excessive carbohydrates or high-fat meals or snacks to make his blood sugar go up  Postprandial readings: 220 after dinner and 303 today after breakfast, only higher today with eating a biscuit  He is apparently programming his pump fairly consistently and bolusing at mealtimes as directed  Also has continued his Victoza unchanged  He has not been able to get the freestyle [place] for some reason and FreeStyle test strips are apparently not covered at the local drugstore  Side effects from medications  have been:none  Compliance with the medical regimen: Good   Glucose monitoring:  Freestyle Neo       Blood Glucose readings as above    Self-care: The diet that the patient has been following is:  variable recently  Meals: 3 meals per day. Lunch  1 pm Dinner 6 pm Breakfast is usually an egg sandwichbut sometimes biscuits   Eating a balanced meals in the evening.  He will have snacks on granola bars              Exercise: minimal recently  Dietician visit, most recent: 6/15.               Weight history:  Wt Readings from Last 3 Encounters:  11/26/17 189 lb 12.8 oz (86.1 kg)  11/25/17 190 lb 6.4 oz (86.4 kg)  10/27/17 187 lb 9.6 oz (85.1 kg)    Glycemic control:    Lab Results  Component Value Date   HGBA1C 7.5 (H) 10/23/2017   HGBA1C 6.6 (H) 07/21/2017   HGBA1C 6.3 04/04/2017   Lab Results  Component Value Date   MICROALBUR 5.2 (H) 07/21/2017   LDLCALC 54 03/28/2016   CREATININE 1.60 (H) 10/23/2017     Past history:  He was not symptomatic at time of diagnosis and was probably treated with Amaryl only.  No details are available of previous management  However he thinks he was not given metformin at any time because of his "kidney problem" He had a A1c of over 9% in 2013 and may have been started on insulin at that time. Most likely has been taking Levemir and NovoLog since then A1c records show his results were over 8% in 2014 and in 4/15 but subsequently down to 7.1 in November 2015 Amaryl was stopped in 7/16  Other active problems are addressed in Review of systems   Allergies as of 11/26/2017   No Known Allergies     Medication List        Accurate as of 11/26/17 12:15 PM. Always use your most recent med list.          amLODipine 10 MG tablet Commonly known as:  NORVASC Take 10 mg by mouth daily.   aspirin 81 MG tablet Take 81 mg by mouth daily.   atorvastatin 20 MG tablet Commonly known as:  LIPITOR Take 20 mg by mouth daily.   Cinnamon  500 MG Tabs Take 1,000 mg by mouth daily.   fish oil-omega-3 fatty acids 1000 MG capsule Take 2 g by mouth daily.   gabapentin 100 MG capsule Commonly known as:  NEURONTIN Take 1 capsule (100 mg total) by mouth 3 (three) times daily.   glucose blood test strip Commonly known as:  FREESTYLE LITE Use as instructed to test blood sugars 3 times daily.   insulin aspart 100 UNIT/ML injection Commonly known as:  NOVOLOG INJECT 85 UNITS SUBCUTANEOUSLY ONCE DAILY WITH  INSULIN  PUMP Dx Code E11.65   lisinopril-hydrochlorothiazide 20-12.5 MG tablet Commonly known as:  PRINZIDE,ZESTORETIC Take 1 tablet by mouth daily.   multivitamin tablet Take 1 tablet by mouth daily.   rOPINIRole 0.5 MG tablet Commonly known as:  REQUIP TAKE 1 TABLET BY MOUTH AT BEDTIME   sertraline 100 MG tablet Commonly known as:  ZOLOFT Take 150 mg by mouth daily.   V-GO 40 Kit USE ONE PER DAY   VICTOZA 18 MG/3ML Sopn Generic drug:  liraglutide INJECT  1.8 MG SUBCUTANEOUSLY ONCE DAILY AT THE SAME TIME EVERYDAY   Vitamin D3 3000 units Tabs Take 1 tablet by mouth daily.       Allergies: No Known Allergies  Past Medical History:  Diagnosis Date  . Degenerative arthritis of shoulder region 08/2013   left  . Disorder of bursae and tendons in left shoulder region 08/2013  . Hyperlipidemia   . Hypertension    under control with meds., has been on med. > 10 yr.  . IDDM (insulin dependent diabetes mellitus) ([place])     Past Surgical History:  Procedure Laterality Date  . CHOLECYSTECTOMY  08/29/2012   Procedure: LAPAROSCOPIC CHOLECYSTECTOMY WITH INTRAOPERATIVE CHOLANGIOGRAM;  Surgeon: Imogene Burn. Georgette Dover, MD;  Location: [facility];  Service: General;  Laterality: N/A;  . SHOULDER ARTHROSCOPY WITH SUBACROMIAL DECOMPRESSION, ROTATOR CUFF REPAIR AND BICEP TENDON REPAIR Left 09/03/2013   Procedure: LEFT SHOULDER ARTHROSCOPY WITH EXTENSIVE DEBRIDMENT, DISTAL CLAVICULECTOMY, ROTATOR CUFF REPAIR AND SUBACROMIAL  DECOMPRESSION PARTIAL ACRIOMIOPLASTY WITH CORACOACROMIAL RELEASE;  Surgeon: Renette Butters, MD;  Location: [place];  Service: Orthopedics;  Laterality: Left;  . TRANSURETHRAL RESECTION OF BLADDER TUMOR WITH MITOMYCIN-C  08/24/2008    Family History  Problem Relation Age of Onset  . Diabetes Mother   . Cancer Father   . Coronary artery disease Father        MI at age 81.   . Sudden death Son   .  CAD Maternal Grandfather        MI in his 100's  . CAD Paternal Grandfather     Social History:  reports that he quit smoking about 20 years ago. he has never used smokeless tobacco. He reports that he does not drink alcohol or use drugs.    Review of Systems   Following is a copy of previous note    HYPERTENSION: has been treated with amlodipine 5 mg, and lisinopril HCT and also lisinopril.  Blood pressure also followed by nephrologist       Lipids: he is taking Lipitor 20 mg, last triglycerides and LDL are normal in 7/18 from his PCP with LDL 68       Lab Results  Component Value Date   CHOL 108 03/28/2016   HDL 28.80 (L) 03/28/2016   LDLCALC 54 03/28/2016   TRIG 130.0 03/28/2016   CHOLHDL 4 03/28/2016               CKD: He has been followed by nephrologist for diabetic nephropathy.   Previous microalbumin/creatinine ratio was 393 and in 4/15 his 24-hour urine protein was 538 Last urine microalbumin ratio is normal  His creatinine is follows:  Lab Results  Component Value Date   CREATININE 1.60 (H) 10/23/2017   CREATININE 1.66 (H) 07/21/2017   CREATININE 1.63 (H) 04/04/2017     Last foot exam in 7/18  Has neuropathic symptoms controlled with low-dose gabapentin, also on Requip from PCP  Eye exam last:  1/18   Physical Examination:  BP (!) 156/92   Pulse 60   Ht '5\' 6"'$  (1.676 m)   Wt 189 lb 12.8 oz (86.1 kg)   BMI 30.63 kg/m        ASSESSMENT:  Diabetes type 2, insulin requiring  See history of present illness for detailed discussion  of his current management, blood sugar patterns and problems identified  Although he has been on the Omnipod insulin pump with about the same basal rate in boluses he is getting blood sugars over 200 Fasting blood sugar was 249 today and highest blood sugar was after breakfast today at 303  He is also continuing the Victoza He is not doing anything unusual with his diet Also is doing his boluses before meals as directed with the new pump and not clear why his blood sugars are higher He was also seen by nurse educator today for the training   PLAN:    He will increase basal rates again  For now he will take a basal rate of 2.2 round the clock, will be getting about 53 units over 24 hours  He will increase his bolus at breakfast to 15 units instead of 12  He will call tomorrow to report his blood sugars and make adjustments again The meantime he was encouraged to start walking or other exercise    There are no Patient Instructions on file for this visit.     Alec [name] 11/26/2017, 12:15 PM   Note: This office note was prepared with Dragon voice recognition system technology. Any transcriptional errors that result from this process are unintentional.

## 2017-11-26 NOTE — Patient Instructions (Signed)
Basal 2.2

## 2017-11-27 ENCOUNTER — Ambulatory Visit: Payer: Medicare Other | Admitting: Endocrinology

## 2017-11-27 ENCOUNTER — Telehealth: Payer: Self-pay | Admitting: Endocrinology

## 2017-11-27 NOTE — Telephone Encounter (Signed)
Dr Dwyane Dee told pt to call with blood sugar readings: 11/26/17 at 4 pm= 146, last night = 219,  11/27/17 at 3:30 a.m. = 129, 9 a.m. = 136, 11am = 100

## 2017-11-27 NOTE — Telephone Encounter (Signed)
He will continue the same basal rate.  If he took 15 units bolus for breakfast today he can change it to 14 units for tomorrow breakfast.  Confirm that last night 219 glucose was after supper  If that is the case instead of 12 units he can try 14 units bolus for dinnertime

## 2017-11-27 NOTE — Telephone Encounter (Signed)
Gave patient the advice from note below and he stated an understanding

## 2017-11-28 DIAGNOSIS — M25611 Stiffness of right shoulder, not elsewhere classified: Secondary | ICD-10-CM | POA: Diagnosis not present

## 2017-11-28 DIAGNOSIS — Z872 Personal history of diseases of the skin and subcutaneous tissue: Secondary | ICD-10-CM

## 2017-11-28 DIAGNOSIS — M25511 Pain in right shoulder: Secondary | ICD-10-CM | POA: Diagnosis not present

## 2017-11-28 DIAGNOSIS — M6281 Muscle weakness (generalized): Secondary | ICD-10-CM | POA: Diagnosis not present

## 2017-11-28 HISTORY — DX: Personal history of diseases of the skin and subcutaneous tissue: Z87.2

## 2017-11-28 NOTE — Progress Notes (Signed)
Mr. Beitler reported not understanding, that while the pump closed the viewing window after 1 min., if his bolus was still infusing.  When he then opened the window again, he would accidentally stop the bolus.  I Explained that the bolus will continue to infuse, if the window on the PDM goes blank, and that he will hear a beep when it finishes.  He reported good understanding of this.  Looking into the bolus history, he had stopped the bolus for dinner X2, and managed to give the bolus 2 more times, but giving extra insulin for this meal.   We reviewed again the steps to give the bolus and he reported good understanding of this.  We also reviewed alerts and alarms, and he reported good understanding of how to confirm the alerts and what to do.  After seeing Dr. Dwyane Dee, he was instructed again how to change the basal rate,and this was done with some assistance from me. We also changed his carb ratio at breakfast to 150g (15u).

## 2017-11-28 NOTE — Progress Notes (Signed)
Bolus history showed not boluses stopped.  We reviewed alerts and alarms, temp basal rate--when/how to use it, how to adjust settings, high blood sugar protocol, and sick day guidelines.  We also dicussed how to do a correction bolus, if his HS reading is high.  He reported good understanding of this.   He signed off the checklist as understanding all topics and had no final questions.

## 2017-11-28 NOTE — Patient Instructions (Signed)
Continue to test before meals and at bedtime

## 2017-11-28 NOTE — Patient Instructions (Signed)
Review booklet given on how to change the basal rates, give a bolus and call if questions

## 2017-12-03 DIAGNOSIS — M6281 Muscle weakness (generalized): Secondary | ICD-10-CM | POA: Diagnosis not present

## 2017-12-03 DIAGNOSIS — M25511 Pain in right shoulder: Secondary | ICD-10-CM | POA: Diagnosis not present

## 2017-12-03 DIAGNOSIS — M25611 Stiffness of right shoulder, not elsewhere classified: Secondary | ICD-10-CM | POA: Diagnosis not present

## 2017-12-04 ENCOUNTER — Other Ambulatory Visit: Payer: Medicare Other

## 2017-12-07 ENCOUNTER — Telehealth: Payer: Self-pay | Admitting: Nutrition

## 2017-12-07 ENCOUNTER — Emergency Department (HOSPITAL_COMMUNITY)
Admission: EM | Admit: 2017-12-07 | Discharge: 2017-12-08 | Disposition: A | Discharge status: Home / Self Care | Payer: Medicare Other | Source: Home / Self Care | Attending: Emergency Medicine | Admitting: Emergency Medicine

## 2017-12-07 ENCOUNTER — Encounter (HOSPITAL_COMMUNITY): Payer: Self-pay

## 2017-12-07 ENCOUNTER — Other Ambulatory Visit: Payer: Self-pay

## 2017-12-07 DIAGNOSIS — E1122 Type 2 diabetes mellitus with diabetic chronic kidney disease: Secondary | ICD-10-CM | POA: Diagnosis not present

## 2017-12-07 DIAGNOSIS — Z87891 Personal history of nicotine dependence: Secondary | ICD-10-CM

## 2017-12-07 DIAGNOSIS — I10 Essential (primary) hypertension: Secondary | ICD-10-CM | POA: Insufficient documentation

## 2017-12-07 DIAGNOSIS — I129 Hypertensive chronic kidney disease with stage 1 through stage 4 chronic kidney disease, or unspecified chronic kidney disease: Secondary | ICD-10-CM | POA: Diagnosis not present

## 2017-12-07 DIAGNOSIS — Z794 Long term (current) use of insulin: Secondary | ICD-10-CM

## 2017-12-07 DIAGNOSIS — E119 Type 2 diabetes mellitus without complications: Secondary | ICD-10-CM | POA: Insufficient documentation

## 2017-12-07 DIAGNOSIS — L03116 Cellulitis of left lower limb: Secondary | ICD-10-CM | POA: Diagnosis not present

## 2017-12-07 DIAGNOSIS — Z79899 Other long term (current) drug therapy: Secondary | ICD-10-CM

## 2017-12-07 DIAGNOSIS — E876 Hypokalemia: Secondary | ICD-10-CM | POA: Diagnosis not present

## 2017-12-07 DIAGNOSIS — T8572XA Infection and inflammatory reaction due to insulin pump, initial encounter: Secondary | ICD-10-CM | POA: Diagnosis not present

## 2017-12-07 DIAGNOSIS — L03114 Cellulitis of left upper limb: Secondary | ICD-10-CM

## 2017-12-07 DIAGNOSIS — E785 Hyperlipidemia, unspecified: Secondary | ICD-10-CM | POA: Diagnosis not present

## 2017-12-07 MED ORDER — CEFAZOLIN SODIUM-DEXTROSE 1-4 GM/50ML-% IV SOLN
1.0000 g | Freq: Once | INTRAVENOUS | Status: AC
  Administered 2017-12-07: 1 g via INTRAVENOUS
  Filled 2017-12-07: qty 50

## 2017-12-07 NOTE — Telephone Encounter (Signed)
Attempted to call the patient twice, no response, left message for him to call back to discuss

## 2017-12-07 NOTE — Telephone Encounter (Signed)
Pt. Called saying he was woke up last night having extreme pain in his upper arm where his pod was placed.  He removed the pod and found it extremely swollen and red. He put on a new pod, and this morning it is still extremely painful, with it radiating down to his elbow. He was told to come to the office tomorrow morning, but said he was starting back to work and could only come in the afternoon after 4PM.  He was told to do this, and to put neosporin on the site q 4 hours, and if a fever develops before tomorrow, to go to the ER.  He agreed to do this.

## 2017-12-07 NOTE — ED Provider Notes (Addendum)
[place] DEPT Provider Note: Georgena Spurling, MD, FACEP  CSN: [id number] MRN: [id number] ARRIVAL: 12/07/17 at 2139 ROOM: [place]  Arm Pain   HISTORY OF PRESENT ILLNESS  12/07/17 11:21 PM Alec [name] is a 72 y.o. male with diabetes.  He has been using disposable insulin pumps for about the past 6 weeks.  His most recent insulin pump was placed on his left upper arm 2 days ago.  The site became painful earlier this morning and he removed the pump.  He continues to have pain at that site with surrounding redness.  The area is tender.  He rates his pain as a 3 out of 10, worse with movement or palpation.  He denies fever or chills but was noted to have a temperature of 99 on arrival.   Past Medical History:  Diagnosis Date  . Degenerative arthritis of shoulder region 08/2013   left  . Disorder of bursae and tendons in left shoulder region 08/2013  . Hyperlipidemia   . Hypertension    under control with meds., has been on med. > 10 yr.  . IDDM (insulin dependent diabetes mellitus) ([place])     Past Surgical History:  Procedure Laterality Date  . CHOLECYSTECTOMY  08/29/2012   Procedure: LAPAROSCOPIC CHOLECYSTECTOMY WITH INTRAOPERATIVE CHOLANGIOGRAM;  Surgeon: Imogene Burn. Georgette Dover, MD;  Location: [facility];  Service: General;  Laterality: N/A;  . SHOULDER ARTHROSCOPY WITH SUBACROMIAL DECOMPRESSION, ROTATOR CUFF REPAIR AND BICEP TENDON REPAIR Left 09/03/2013   Procedure: LEFT SHOULDER ARTHROSCOPY WITH EXTENSIVE DEBRIDMENT, DISTAL CLAVICULECTOMY, ROTATOR CUFF REPAIR AND SUBACROMIAL DECOMPRESSION PARTIAL ACRIOMIOPLASTY WITH CORACOACROMIAL RELEASE;  Surgeon: Renette Butters, MD;  Location: [facility];  Service: Orthopedics;  Laterality: Left;  . TRANSURETHRAL RESECTION OF BLADDER TUMOR WITH MITOMYCIN-C  08/24/2008    Family History  Problem Relation Age of Onset  . Diabetes Mother   . Cancer Father   . Coronary artery disease Father        MI at age 26.    . Sudden death Son   . CAD Maternal Grandfather        MI in his 81's  . CAD Paternal Grandfather     Social History   Tobacco Use  . Smoking status: Former Smoker    Last attempt to quit: 09/11/1997    Years since quitting: 20.2  . Smokeless tobacco: Never Used  Substance Use Topics  . Alcohol use: No  . Drug use: No    Prior to Admission medications   Medication Sig Start Date End Date Taking? Authorizing Provider  amLODipine (NORVASC) 10 MG tablet Take 10 mg by mouth daily.    [provider]  aspirin 81 MG tablet Take 81 mg by mouth daily.    [provider]  atorvastatin (LIPITOR) 20 MG tablet Take 20 mg by mouth daily.    [provider]  Cholecalciferol (VITAMIN D3) 3000 UNITS TABS Take 1 tablet by mouth daily.    [provider]  Cinnamon 500 MG TABS Take 1,000 mg by mouth daily.    [provider]  fish oil-omega-3 fatty acids 1000 MG capsule Take 2 g by mouth daily.    [provider]  gabapentin (NEURONTIN) 100 MG capsule Take 1 capsule (100 mg total) by mouth 3 (three) times daily. 07/25/17   Elayne Snare, MD  glucose blood (FREESTYLE LITE) test strip Use as instructed to test blood sugars 3 times daily. Dx code-E11.65 11/26/17   Dwyane Dee,  Vicenta Aly, MD  insulin aspart (NOVOLOG) 100 UNIT/ML injection INJECT 85 UNITS SUBCUTANEOUSLY ONCE DAILY WITH  INSULIN  PUMP Dx Code E11.65 11/20/17   Elayne Snare, MD  Insulin Disposable Pump (V-GO 40) KIT USE ONE PER DAY Patient not taking: Reported on 11/26/2017 11/18/17   Elayne Snare, MD  lisinopril-hydrochlorothiazide (PRINZIDE,ZESTORETIC) 20-12.5 MG tablet Take 1 tablet by mouth daily.  07/09/17   [provider]  Multiple Vitamin (MULTIVITAMIN) tablet Take 1 tablet by mouth daily.    [provider]  rOPINIRole (REQUIP) 0.5 MG tablet TAKE 1 TABLET BY MOUTH AT BEDTIME 09/15/17   Elayne Snare, MD  sertraline (ZOLOFT) 100 MG tablet Take 150 mg by mouth daily.     [provider]  VICTOZA 18 MG/3ML SOPN INJECT  1.8 MG SUBCUTANEOUSLY ONCE DAILY AT THE SAME TIME EVERYDAY 10/27/17   Elayne Snare, MD    Allergies Patient has no known allergies.   REVIEW OF SYSTEMS  Negative except as noted here or in the History of Present Illness.   PHYSICAL EXAMINATION  Initial Vital Signs Blood pressure (!) 121/95, pulse 88, temperature 99 F (37.2 C), temperature source Oral, resp. rate 18, SpO2 99 %.  Examination General: Well-developed, well-nourished male in no acute distress; appearance consistent with age of record HENT: normocephalic; atraumatic Eyes: pupils equal, round and reactive to light; extraocular muscles intact Neck: supple Heart: regular rate and rhythm Lungs: clear to auscultation bilaterally Abdomen: soft; nondistended; nontender; bowel sounds present Extremities: No deformity; full range of motion; pulses normal Neurologic: Awake, alert and oriented; motor function intact in all extremities and symmetric; no facial droop Skin: Warm and dry; tender, erythematous region left upper arm:  Psychiatric: Normal mood and affect   RESULTS  Summary of this visit's results, reviewed by myself:   EKG Interpretation  Date/Time:    Ventricular Rate:    PR Interval:    QRS Duration:   QT Interval:    QTC Calculation:   R Axis:     Text Interpretation:        Laboratory Studies: Results for orders placed or performed during the hospital encounter of 12/07/17 (from the past 24 hour(s))  CBC with Differential/Platelet     Status: Abnormal   Collection Time: 12/07/17 11:49 PM  Result Value Ref Range   WBC 6.7 4.0 - 10.5 K/uL   RBC 4.27 4.22 - 5.81 MIL/uL   Hemoglobin 13.4 13.0 - 17.0 g/dL   HCT 37.5 (L) 39.0 - 52.0 %   MCV 87.8 78.0 - 100.0 fL   MCH 31.4 26.0 - 34.0 pg   MCHC 35.7 30.0 - 36.0 g/dL   RDW 13.1 11.5 - 15.5 %   Platelets 126 (L) 150 - 400 K/uL   Neutrophils Relative % 67 %   Neutro Abs 4.5 1.7 - 7.7 K/uL   Lymphocytes  Relative 23 %   Lymphs Abs 1.5 0.7 - 4.0 K/uL   Monocytes Relative 9 %   Monocytes Absolute 0.6 0.1 - 1.0 K/uL   Eosinophils Relative 1 %   Eosinophils Absolute 0.0 0.0 - 0.7 K/uL   Basophils Relative 0 %   Basophils Absolute 0.0 0.0 - 0.1 K/uL  Basic metabolic panel     Status: Abnormal   Collection Time: 12/07/17 11:49 PM  Result Value Ref Range   Sodium 140 135 - 145 mmol/L   Potassium 3.7 3.5 - 5.1 mmol/L   Chloride 107 101 - 111 mmol/L   CO2 25 22 - 32 mmol/L  Glucose, Bld 144 (H) 65 - 99 mg/dL   BUN 20 6 - 20 mg/dL   Creatinine, Ser 1.46 (H) 0.61 - 1.24 mg/dL   Calcium 9.4 8.9 - 10.3 mg/dL   GFR calc non Af Amer 47 (L) >60 mL/min   GFR calc Af Amer 55 (L) >60 mL/min   Anion gap 8 5 - 15   Imaging Studies: No results found.  ED COURSE  Nursing notes and initial vitals signs, including pulse oximetry, reviewed.  Vitals:   12/07/17 2240 12/07/17 2343  BP: (!) 121/95   Pulse: 88   Resp: 18   Temp: 99 F (37.2 C)   TempSrc: Oral   SpO2: 99%   Weight:  87.1 kg (192 lb)  Height:  '5\' 6"'$  (1.676 m)   11:35 PM Ancef 1 g ordered for cellulitis.  Extent of erythema marked with a skin marking pen.  12:31 AM We will treat patient with Keflex as an outpatient.  He was advised that if symptoms worsen or do not improve over the next 24-48 hours he should be reevaluated as he may need to be admitted for IV antibiotics or changed to a different antibiotic.  He was advised not to wear his insulin pump on the left arm until he has fully recovered.  PROCEDURES    ED DIAGNOSES     ICD-10-CM   1. Cellulitis of left upper arm [id number]        Caly Pellum, MD 12/08/17 0032    Shanon Rosser, MD 12/08/17 [phone]

## 2017-12-07 NOTE — ED Triage Notes (Signed)
Pt wears the insulin pack that sticks and yesterday his arm started to hurt and become red, he's wore them for about 6 weeks now and hasn't had any issues

## 2017-12-08 ENCOUNTER — Telehealth: Payer: Self-pay | Admitting: Endocrinology

## 2017-12-08 ENCOUNTER — Ambulatory Visit: Payer: Medicare Other | Admitting: Endocrinology

## 2017-12-08 ENCOUNTER — Telehealth: Payer: Self-pay | Admitting: Nutrition

## 2017-12-08 LAB — CBC WITH DIFFERENTIAL/PLATELET
Basophils Absolute: 0 10*3/uL (ref 0.0–0.1)
Basophils Relative: 0 %
Eosinophils Absolute: 0 10*3/uL (ref 0.0–0.7)
Eosinophils Relative: 1 %
HCT: 37.5 % — ABNORMAL LOW (ref 39.0–52.0)
Hemoglobin: 13.4 g/dL (ref 13.0–17.0)
Lymphocytes Relative: 23 %
Lymphs Abs: 1.5 10*3/uL (ref 0.7–4.0)
MCH: 31.4 pg (ref 26.0–34.0)
MCHC: 35.7 g/dL (ref 30.0–36.0)
MCV: 87.8 fL (ref 78.0–100.0)
Monocytes Absolute: 0.6 10*3/uL (ref 0.1–1.0)
Monocytes Relative: 9 %
Neutro Abs: 4.5 10*3/uL (ref 1.7–7.7)
Neutrophils Relative %: 67 %
Platelets: 126 10*3/uL — ABNORMAL LOW (ref 150–400)
RBC: 4.27 MIL/uL (ref 4.22–5.81)
RDW: 13.1 % (ref 11.5–15.5)
WBC: 6.7 10*3/uL (ref 4.0–10.5)

## 2017-12-08 LAB — BASIC METABOLIC PANEL
Anion gap: 8 (ref 5–15)
BUN: 20 mg/dL (ref 6–20)
CO2: 25 mmol/L (ref 22–32)
Calcium: 9.4 mg/dL (ref 8.9–10.3)
Chloride: 107 mmol/L (ref 101–111)
Creatinine, Ser: 1.46 mg/dL — ABNORMAL HIGH (ref 0.61–1.24)
GFR calc Af Amer: 55 mL/min — ABNORMAL LOW (ref >60)
GFR calc non Af Amer: 47 mL/min — ABNORMAL LOW (ref >60)
Glucose, Bld: 144 mg/dL — ABNORMAL HIGH (ref 65–99)
Potassium: 3.7 mmol/L (ref 3.5–5.1)
Sodium: 140 mmol/L (ref 135–145)

## 2017-12-08 MED ORDER — CEPHALEXIN 500 MG PO CAPS: 500.0000 mg | ORAL_CAPSULE | Freq: Four times a day (QID) | ORAL | 0 refills | Status: DC

## 2017-12-08 NOTE — Telephone Encounter (Signed)
Pt. Was contacted via text to contact the office concerning his arm, with a note to contact me that  he got this message.  He contacted me via phone that he had been out that evening and forgot his phone.  He promised to call the office.  Telephone number given.

## 2017-12-09 ENCOUNTER — Telehealth: Payer: Self-pay | Admitting: Endocrinology

## 2017-12-09 ENCOUNTER — Encounter (HOSPITAL_COMMUNITY): Payer: Self-pay

## 2017-12-09 ENCOUNTER — Other Ambulatory Visit: Payer: Self-pay

## 2017-12-09 ENCOUNTER — Inpatient Hospital Stay (HOSPITAL_COMMUNITY)
Admission: EM | Admit: 2017-12-09 | Discharge: 2017-12-11 | DRG: 920 | Disposition: A | Discharge status: Home / Self Care | Payer: Medicare Other | Attending: Family Medicine | Admitting: Family Medicine

## 2017-12-09 ENCOUNTER — Telehealth: Payer: Self-pay | Admitting: Nutrition

## 2017-12-09 DIAGNOSIS — IMO0001 Reserved for inherently not codable concepts without codable children: Secondary | ICD-10-CM

## 2017-12-09 DIAGNOSIS — I10 Essential (primary) hypertension: Secondary | ICD-10-CM | POA: Diagnosis present

## 2017-12-09 DIAGNOSIS — Z87891 Personal history of nicotine dependence: Secondary | ICD-10-CM

## 2017-12-09 DIAGNOSIS — E876 Hypokalemia: Secondary | ICD-10-CM | POA: Diagnosis present

## 2017-12-09 DIAGNOSIS — E1121 Type 2 diabetes mellitus with diabetic nephropathy: Secondary | ICD-10-CM | POA: Diagnosis present

## 2017-12-09 DIAGNOSIS — Z79899 Other long term (current) drug therapy: Secondary | ICD-10-CM

## 2017-12-09 DIAGNOSIS — Z8639 Personal history of other endocrine, nutritional and metabolic disease: Secondary | ICD-10-CM

## 2017-12-09 DIAGNOSIS — L0390 Cellulitis, unspecified: Secondary | ICD-10-CM | POA: Diagnosis present

## 2017-12-09 DIAGNOSIS — L03116 Cellulitis of left lower limb: Secondary | ICD-10-CM | POA: Diagnosis not present

## 2017-12-09 DIAGNOSIS — N182 Chronic kidney disease, stage 2 (mild): Secondary | ICD-10-CM | POA: Diagnosis present

## 2017-12-09 DIAGNOSIS — E119 Type 2 diabetes mellitus without complications: Secondary | ICD-10-CM

## 2017-12-09 DIAGNOSIS — L03114 Cellulitis of left upper limb: Secondary | ICD-10-CM | POA: Diagnosis present

## 2017-12-09 DIAGNOSIS — I129 Hypertensive chronic kidney disease with stage 1 through stage 4 chronic kidney disease, or unspecified chronic kidney disease: Secondary | ICD-10-CM | POA: Diagnosis present

## 2017-12-09 DIAGNOSIS — Z7982 Long term (current) use of aspirin: Secondary | ICD-10-CM

## 2017-12-09 DIAGNOSIS — E1122 Type 2 diabetes mellitus with diabetic chronic kidney disease: Secondary | ICD-10-CM

## 2017-12-09 DIAGNOSIS — T502X5A Adverse effect of carbonic-anhydrase inhibitors, benzothiadiazides and other diuretics, initial encounter: Secondary | ICD-10-CM | POA: Diagnosis present

## 2017-12-09 DIAGNOSIS — T8572XA Infection and inflammatory reaction due to insulin pump, initial encounter: Principal | ICD-10-CM | POA: Diagnosis present

## 2017-12-09 DIAGNOSIS — Z794 Long term (current) use of insulin: Secondary | ICD-10-CM

## 2017-12-09 DIAGNOSIS — E785 Hyperlipidemia, unspecified: Secondary | ICD-10-CM | POA: Diagnosis present

## 2017-12-09 LAB — CBC WITH DIFFERENTIAL/PLATELET
Basophils Absolute: 0 10*3/uL (ref 0.0–0.1)
Basophils Relative: 0 %
Eosinophils Absolute: 0.1 10*3/uL (ref 0.0–0.7)
Eosinophils Relative: 1 %
HCT: 37.4 % — ABNORMAL LOW (ref 39.0–52.0)
Hemoglobin: 13.3 g/dL (ref 13.0–17.0)
Lymphocytes Relative: 22 %
Lymphs Abs: 1.4 10*3/uL (ref 0.7–4.0)
MCH: 31.1 pg (ref 26.0–34.0)
MCHC: 35.6 g/dL (ref 30.0–36.0)
MCV: 87.6 fL (ref 78.0–100.0)
Monocytes Absolute: 0.5 10*3/uL (ref 0.1–1.0)
Monocytes Relative: 8 %
Neutro Abs: 4.2 10*3/uL (ref 1.7–7.7)
Neutrophils Relative %: 69 %
Platelets: 130 10*3/uL — ABNORMAL LOW (ref 150–400)
RBC: 4.27 MIL/uL (ref 4.22–5.81)
RDW: 13.2 % (ref 11.5–15.5)
WBC: 6.1 10*3/uL (ref 4.0–10.5)

## 2017-12-09 LAB — CBG MONITORING, ED: Glucose-Capillary: 78 mg/dL (ref 65–99)

## 2017-12-09 LAB — BASIC METABOLIC PANEL
Anion gap: 10 (ref 5–15)
BUN: 24 mg/dL — ABNORMAL HIGH (ref 6–20)
CO2: 25 mmol/L (ref 22–32)
Calcium: 9.4 mg/dL (ref 8.9–10.3)
Chloride: 105 mmol/L (ref 101–111)
Creatinine, Ser: 1.45 mg/dL — ABNORMAL HIGH (ref 0.61–1.24)
GFR calc Af Amer: 55 mL/min — ABNORMAL LOW (ref >60)
GFR calc non Af Amer: 48 mL/min — ABNORMAL LOW (ref >60)
Glucose, Bld: 105 mg/dL — ABNORMAL HIGH (ref 65–99)
Potassium: 3.2 mmol/L — ABNORMAL LOW (ref 3.5–5.1)
Sodium: 140 mmol/L (ref 135–145)

## 2017-12-09 LAB — I-STAT CG4 LACTIC ACID, ED: Lactic Acid, Venous: 0.76 mmol/L (ref 0.5–1.9)

## 2017-12-09 MED ORDER — POTASSIUM CHLORIDE CRYS ER 20 MEQ PO TBCR
40.0000 meq | EXTENDED_RELEASE_TABLET | Freq: Once | ORAL | Status: AC
  Administered 2017-12-10: 40 meq via ORAL
  Filled 2017-12-09: qty 2

## 2017-12-09 MED ORDER — LIDOCAINE-EPINEPHRINE (PF) 2 %-1:200000 IJ SOLN
10.0000 mL | Freq: Once | INTRAMUSCULAR | Status: AC
  Administered 2017-12-09: 10 mL
  Filled 2017-12-09: qty 20

## 2017-12-09 MED ORDER — VANCOMYCIN HCL IN DEXTROSE 1-5 GM/200ML-% IV SOLN
1000.0000 mg | Freq: Once | INTRAVENOUS | Status: AC
  Administered 2017-12-09: 1000 mg via INTRAVENOUS
  Filled 2017-12-09: qty 200

## 2017-12-09 NOTE — Telephone Encounter (Signed)
[place] ph# [phone] needs e-scribe for Novalog RX - Medicare has new rules that the medication needs to be e-scribed with diagnosis code-patient is out of insulin

## 2017-12-09 NOTE — Telephone Encounter (Signed)
Phone Call to patient to see how he is doing.  Pt. Reports that he was diagnosed with cellulitis, and that his arm is still very sore.  He is on antibiotics.  FBS today was 149, and acL: 116.  He reports he is still wearing the pod, but now on his abdomen with no problems.

## 2017-12-09 NOTE — ED Provider Notes (Signed)
[facility] DEPT Provider Note   CSN: [id number] Arrival date & time: 12/09/17  1717     History   Chief Complaint Chief Complaint  Patient presents with  . Cellulitis    HPI Alec [name] is a 72 y.o. left upper extremity male with past medical history significant for insulin-dependent diabetes who presents the emergency department today for worsening cellulitis.  This was precipitated by using an insulin pump over his left upper arm 4 days ago. Patient was seen here 2 days ago and found to have cellulitis on his left upper extremity.  The patient was given 1 g of Ancef in the department and the site was marked.  He was discharged home on Keflex 4 times daily.  He has had 7 doses of this at home but notes that the area has now spread and he has streaking of the redness up his arm.  He denies any trauma, joint swelling, drainage, fever, chills, nausea or vomiting.  HPI  Past Medical History:  Diagnosis Date  . Degenerative arthritis of shoulder region 08/2013   left  . Disorder of bursae and tendons in left shoulder region 08/2013  . Hyperlipidemia   . Hypertension    under control with meds., has been on med. > 10 yr.  . IDDM (insulin dependent diabetes mellitus) [facility])     Patient Active Problem List   Diagnosis Date Noted  . Hyperlipidemia LDL goal <100 04/17/2017  . Diabetic nephropathy associated with type 2 diabetes mellitus ([place]) 12/14/2014  . Essential hypertension, benign 12/14/2014  . IDDM (insulin dependent diabetes mellitus) ([place]) 08/29/2012  . HTN (hypertension) 08/29/2012  . Dyslipidemia 08/29/2012  . Abdominal pain 08/29/2012    Past Surgical History:  Procedure Laterality Date  . CHOLECYSTECTOMY  08/29/2012   Procedure: LAPAROSCOPIC CHOLECYSTECTOMY WITH INTRAOPERATIVE CHOLANGIOGRAM;  Surgeon: Imogene Burn. Georgette Dover, MD;  Location: [facility];  Service: General;  Laterality: N/A;  . SHOULDER ARTHROSCOPY WITH SUBACROMIAL DECOMPRESSION,  ROTATOR CUFF REPAIR AND BICEP TENDON REPAIR Left 09/03/2013   Procedure: LEFT SHOULDER ARTHROSCOPY WITH EXTENSIVE DEBRIDMENT, DISTAL CLAVICULECTOMY, ROTATOR CUFF REPAIR AND SUBACROMIAL DECOMPRESSION PARTIAL ACRIOMIOPLASTY WITH CORACOACROMIAL RELEASE;  Surgeon: Renette Butters, MD;  Location: [facility];  Service: Orthopedics;  Laterality: Left;  . TRANSURETHRAL RESECTION OF BLADDER TUMOR WITH MITOMYCIN-C  08/24/2008       Home Medications    Prior to Admission medications   Medication Sig Start Date End Date Taking? Authorizing Provider  amLODipine (NORVASC) 10 MG tablet Take 10 mg by mouth daily.    [provider]  aspirin 81 MG tablet Take 81 mg by mouth daily.    [provider]  atorvastatin (LIPITOR) 20 MG tablet Take 20 mg by mouth daily.    [provider]  cephALEXin (KEFLEX) 500 MG capsule Take 1 capsule (500 mg total) by mouth 4 (four) times daily. 12/08/17   Molpus, John, MD  Cholecalciferol (VITAMIN D3) 3000 UNITS TABS Take 1 tablet by mouth daily.    [provider]  Cinnamon 500 MG TABS Take 1,000 mg by mouth daily.    [provider]  fish oil-omega-3 fatty acids 1000 MG capsule Take 2 g by mouth daily.    [provider]  gabapentin (NEURONTIN) 100 MG capsule Take 1 capsule (100 mg total) by mouth 3 (three) times daily. 07/25/17   Elayne Snare, MD  glucose blood (FREESTYLE LITE) test strip Use as instructed to test blood sugars 3 times daily. Dx  code-E11.65 11/26/17   Elayne Snare, MD  insulin aspart (NOVOLOG) 100 UNIT/ML injection INJECT 85 UNITS SUBCUTANEOUSLY ONCE DAILY WITH  INSULIN  PUMP Dx Code E11.65 11/20/17   Elayne Snare, MD  Insulin Disposable Pump (V-GO 40) KIT USE ONE PER DAY Patient not taking: Reported on 11/26/2017 11/18/17   Elayne Snare, MD  lisinopril-hydrochlorothiazide (PRINZIDE,ZESTORETIC) 20-12.5 MG tablet Take 1 tablet by mouth daily.  07/09/17   [provider]  Multiple Vitamin  (MULTIVITAMIN) tablet Take 1 tablet by mouth daily.    [provider]  rOPINIRole (REQUIP) 0.5 MG tablet TAKE 1 TABLET BY MOUTH AT BEDTIME 09/15/17   Elayne Snare, MD  sertraline (ZOLOFT) 100 MG tablet Take 150 mg by mouth daily.     [provider]  VICTOZA 18 MG/3ML SOPN INJECT  1.8 MG SUBCUTANEOUSLY ONCE DAILY AT THE SAME TIME EVERYDAY 10/27/17   Elayne Snare, MD    Family History Family History  Problem Relation Age of Onset  . Diabetes Mother   . Cancer Father   . Coronary artery disease Father        MI at age 75.   . Sudden death Son   . CAD Maternal Grandfather        MI in his 39's  . CAD Paternal Grandfather     Social History Social History   Tobacco Use  . Smoking status: Former Smoker    Last attempt to quit: 09/11/1997    Years since quitting: 20.2  . Smokeless tobacco: Never Used  Substance Use Topics  . Alcohol use: No  . Drug use: No     Allergies   Patient has no known allergies.   Review of Systems Review of Systems  All other systems reviewed and are negative.    Physical Exam Updated Vital Signs BP (!) 170/91 (BP Location: Left Arm)   Pulse 67   Temp 98.2 F (36.8 C) (Oral)   Resp 18   Ht _0  (1.676 m)   Wt 87.1 kg (192 lb)   SpO2 98%   BMI 30.99 kg/m   Physical Exam  Constitutional: He appears well-developed and well-nourished.  HENT:  Head: Normocephalic and atraumatic.  Right Ear: External ear normal.  Left Ear: External ear normal.  Nose: Nose normal.  Mouth/Throat: Uvula is midline, oropharynx is clear and moist and mucous membranes are normal. No tonsillar exudate.  Eyes: Conjunctivae are normal. Pupils are equal, round, and reactive to light. Right eye exhibits no discharge. Left eye exhibits no discharge. No scleral icterus.  Neck: Trachea normal. Neck supple. No spinous process tenderness present. No neck rigidity. Normal range of motion present.  Cardiovascular: Normal rate, regular rhythm and intact  distal pulses.  No murmur heard. Pulses:      Radial pulses are 2+ on the right side, and 2+ on the left side.       Dorsalis pedis pulses are 2+ on the right side, and 2+ on the left side.       Posterior tibial pulses are 2+ on the right side, and 2+ on the left side.  Pulmonary/Chest: Effort normal and breath sounds normal. No respiratory distress. He exhibits no tenderness.  Abdominal: Soft. Bowel sounds are normal. There is no tenderness. There is no rebound and no guarding.  Musculoskeletal: He exhibits no edema.  Patient with full and active range of motion of his left shoulder, elbow and wrist.  Lymphadenopathy:    He has no cervical adenopathy.  Neurological: He  is alert.  Skin: Skin is warm and dry. No rash noted. He is not diaphoretic. No pallor.  Please see picture. There is blanchable erythema and heat over the left proximal forearm that extends past the elbow and into the proximal forearm. This extends past the area that was previously marked and is with signs of streaking. The patient does have focal area of induration of the proximal forearm, just inside area marked. This measures 2cmx3cm and is without obvious area of fluctuance. No drainage noted.    Psychiatric: He has a normal mood and affect.  Nursing note and vitals reviewed.  Unable to attach all photo's - please see media page for more details.     ED Treatments / Results  Labs (all labs ordered are listed, but only abnormal results are displayed) Labs Reviewed - No data to display  EKG  EKG Interpretation None       Radiology No results found.  Procedures .Marland KitchenIncision and Drainage Date/Time: 12/09/2017 10:32 PM Performed by: Jillyn Ledger, PA-C Authorized by: Jillyn Ledger, PA-C   Consent:    Consent obtained:  Verbal   Consent given by:  Patient   Risks discussed:  Bleeding, damage to other organs, infection, incomplete drainage and pain   Alternatives discussed:  No treatment Location:      Type:  Fluid collection   Size:  1cm   Location:  Upper extremity   Upper extremity location:  Arm   Arm location:  L upper arm Pre-procedure details:    Skin preparation:  Betadine Anesthesia (see MAR for exact dosages):    Anesthesia method:  Local infiltration   Local anesthetic:  Lidocaine 2% WITH epi Procedure type:    Complexity:  Simple Procedure details:    Needle aspiration: no     Incision types:  Stab incision   Scalpel blade:  11   Drainage:  Bloody   Drainage amount:  Scant   Wound treatment:  Wound left open   Packing materials:  None Post-procedure details:    Patient tolerance of procedure:  Tolerated well, no immediate complications   (including critical care time) EMERGENCY DEPARTMENT US SOFT TISSUE INTERPRETATION "Study: Limited Soft Tissue Ultrasound"  INDICATIONS: Pain and Soft tissue infection Multiple views of the body part were obtained in real-time with a multi-frequency linear probe  PERFORMED BY: Myself IMAGES ARCHIVED?: Yes SIDE:Left BODY PART:Upper extremity INTERPRETATION:  Cellulitis present and there is also noted to be a focal area of edema with small fluid collection without obvious drainable abscess  Medications Ordered in ED Medications  vancomycin (VANCOCIN) IVPB 1000 mg/200 mL premix (1,000 mg Intravenous New Bag/Given 12/09/17 2225)  potassium chloride SA (K-DUR,KLOR-CON) CR tablet 40 mEq (not administered)  lidocaine-EPINEPHrine (XYLOCAINE W/EPI) 2 %-1:200000 (PF) injection 10 mL (10 mLs Infiltration Given 12/09/17 2226)     Initial Impression / Assessment and Plan / ED Course  I have reviewed the triage vital signs and the nursing notes.  Pertinent labs & imaging results that were available during my care of the patient were reviewed by me and considered in my medical decision making (see chart for details).     72 y.o. male with past medical history significant for insulin-dependent diabetes who presents the emergency  department today for worsening cellulitis.  This was precipitated by using an insulin pump over his left upper arm 4 days ago. Patient was seen here 2 days ago and found to have cellulitis on his left upper extremity.  The patient  was given 1 g of Ancef in the department and the site was marked.  He was discharged home on Keflex 4 times daily.  He has had 7 doses of this at home but notes that the area has now spread and he has streaking of the redness up his arm.  He denies any trauma, joint swelling, drainage, fever, chills, nausea or vomiting.  Patients vital signs without evidence of sepsis.  On exam patient has significant erythema, heat that spreads past the area of marking.  There is a focal area of induration that measures 2 x 3 cm.  On ultrasound exam there is small area with edema without very obvious abscess.  I reviewed this with my attending and will attempt a stab incision for possible developing abscess.  There is no concern for septic joint at this time as the patient has full range of motion of the upper extremity joints without pain.  Will obtain blood cultures, lactic acid, basic blood work and start the patient on IV vancomycin.  Anticipate admission for failed outpatient cellulitis in a insulin-dependent diabetic.  I&D without evidence of abscess. Vancomycin running.  Patient lab work is reassuring.  There is no leukocytosis.  He is noted to have elevated creatinine of 1.45 which appears near patient's baseline and similar to visit 2 days ago.  No evidence of acute kidney injury.  He has developed some hypokalemia at 3.2.  Will replace orally.  No anion gap acidosis.  No evidence of DKA. Lactic acid wnl. Will consult for admission.   Dr. Hal Hope to admit the patient.  He appears safe for admission.  Final Clinical Impressions(s) / ED Diagnoses   Final diagnoses:  Cellulitis of left upper extremity  History of diabetes mellitus    ED Discharge Orders    None       Lorelle Gibbs 12/09/17 2314    Duffy Bruce, MD 12/10/17 [phone]

## 2017-12-09 NOTE — Telephone Encounter (Signed)
Patient states that his omni pod is causing him pain redness and swelling in his arm. He stated he took it off and put another insulin pump on, redness has spread to his arm.   Please advise on what to do  Called [facility]

## 2017-12-09 NOTE — ED Triage Notes (Signed)
Patient was seen 2 days ago in the ED for cellulitis of the left upper arm. Area was marked. Redness has increased since previous ED visit.

## 2017-12-10 ENCOUNTER — Encounter (HOSPITAL_COMMUNITY): Payer: Self-pay | Admitting: Internal Medicine

## 2017-12-10 ENCOUNTER — Other Ambulatory Visit: Payer: Self-pay

## 2017-12-10 DIAGNOSIS — Z794 Long term (current) use of insulin: Secondary | ICD-10-CM | POA: Diagnosis not present

## 2017-12-10 DIAGNOSIS — Z7982 Long term (current) use of aspirin: Secondary | ICD-10-CM | POA: Diagnosis not present

## 2017-12-10 DIAGNOSIS — I10 Essential (primary) hypertension: Secondary | ICD-10-CM

## 2017-12-10 DIAGNOSIS — N182 Chronic kidney disease, stage 2 (mild): Secondary | ICD-10-CM | POA: Diagnosis present

## 2017-12-10 DIAGNOSIS — E119 Type 2 diabetes mellitus without complications: Secondary | ICD-10-CM

## 2017-12-10 DIAGNOSIS — T8572XA Infection and inflammatory reaction due to insulin pump, initial encounter: Secondary | ICD-10-CM | POA: Diagnosis present

## 2017-12-10 DIAGNOSIS — Z79899 Other long term (current) drug therapy: Secondary | ICD-10-CM | POA: Diagnosis not present

## 2017-12-10 DIAGNOSIS — T502X5A Adverse effect of carbonic-anhydrase inhibitors, benzothiadiazides and other diuretics, initial encounter: Secondary | ICD-10-CM | POA: Diagnosis present

## 2017-12-10 DIAGNOSIS — L03114 Cellulitis of left upper limb: Secondary | ICD-10-CM | POA: Diagnosis present

## 2017-12-10 DIAGNOSIS — I129 Hypertensive chronic kidney disease with stage 1 through stage 4 chronic kidney disease, or unspecified chronic kidney disease: Secondary | ICD-10-CM | POA: Diagnosis present

## 2017-12-10 DIAGNOSIS — E785 Hyperlipidemia, unspecified: Secondary | ICD-10-CM | POA: Diagnosis present

## 2017-12-10 DIAGNOSIS — Z87891 Personal history of nicotine dependence: Secondary | ICD-10-CM | POA: Diagnosis not present

## 2017-12-10 DIAGNOSIS — E876 Hypokalemia: Secondary | ICD-10-CM | POA: Diagnosis present

## 2017-12-10 DIAGNOSIS — E1122 Type 2 diabetes mellitus with diabetic chronic kidney disease: Secondary | ICD-10-CM | POA: Diagnosis present

## 2017-12-10 LAB — BASIC METABOLIC PANEL
Anion gap: 7 (ref 5–15)
BUN: 20 mg/dL (ref 6–20)
CO2: 24 mmol/L (ref 22–32)
Calcium: 9.1 mg/dL (ref 8.9–10.3)
Chloride: 108 mmol/L (ref 101–111)
Creatinine, Ser: 1.26 mg/dL — ABNORMAL HIGH (ref 0.61–1.24)
GFR calc Af Amer: >60 mL/min (ref >60)
GFR calc non Af Amer: 57 mL/min — ABNORMAL LOW (ref >60)
Glucose, Bld: 99 mg/dL (ref 65–99)
Potassium: 3.6 mmol/L (ref 3.5–5.1)
Sodium: 139 mmol/L (ref 135–145)

## 2017-12-10 LAB — GLUCOSE, CAPILLARY
Glucose-Capillary: 65 mg/dL (ref 65–99)
Glucose-Capillary: 133 mg/dL — ABNORMAL HIGH (ref 65–99)
Glucose-Capillary: 129 mg/dL — ABNORMAL HIGH (ref 65–99)
Glucose-Capillary: 247 mg/dL — ABNORMAL HIGH (ref 65–99)
Glucose-Capillary: 128 mg/dL — ABNORMAL HIGH (ref 65–99)

## 2017-12-10 LAB — CBC
HCT: 36 % — ABNORMAL LOW (ref 39.0–52.0)
Hemoglobin: 12.8 g/dL — ABNORMAL LOW (ref 13.0–17.0)
MCH: 31.5 pg (ref 26.0–34.0)
MCHC: 35.6 g/dL (ref 30.0–36.0)
MCV: 88.7 fL (ref 78.0–100.0)
Platelets: 121 10*3/uL — ABNORMAL LOW (ref 150–400)
RBC: 4.06 MIL/uL — ABNORMAL LOW (ref 4.22–5.81)
RDW: 13.1 % (ref 11.5–15.5)
WBC: 5.8 10*3/uL (ref 4.0–10.5)

## 2017-12-10 MED ORDER — ASPIRIN EC 81 MG PO TBEC
81.0000 mg | DELAYED_RELEASE_TABLET | Freq: Every day | ORAL | Status: DC
  Administered 2017-12-10 – 2017-12-11 (×2): 81 mg via ORAL
  Filled 2017-12-10 (×2): qty 1

## 2017-12-10 MED ORDER — ONDANSETRON HCL 4 MG PO TABS: 4.0000 mg | ORAL_TABLET | Freq: Four times a day (QID) | ORAL | Status: DC | PRN

## 2017-12-10 MED ORDER — AMLODIPINE BESYLATE 10 MG PO TABS
10.0000 mg | ORAL_TABLET | Freq: Every day | ORAL | Status: DC
  Administered 2017-12-10 – 2017-12-11 (×2): 10 mg via ORAL
  Filled 2017-12-10 (×2): qty 1

## 2017-12-10 MED ORDER — ONDANSETRON HCL 4 MG/2ML IJ SOLN: 4.0000 mg | Freq: Four times a day (QID) | INTRAMUSCULAR | Status: DC | PRN

## 2017-12-10 MED ORDER — VANCOMYCIN HCL IN DEXTROSE 1-5 GM/200ML-% IV SOLN
1000.0000 mg | INTRAVENOUS | Status: DC
  Administered 2017-12-10 – 2017-12-11 (×2): 1000 mg via INTRAVENOUS
  Filled 2017-12-10 (×2): qty 200

## 2017-12-10 MED ORDER — TRAMADOL HCL 50 MG PO TABS
50.0000 mg | ORAL_TABLET | Freq: Four times a day (QID) | ORAL | Status: DC | PRN
  Administered 2017-12-10 (×2): 50 mg via ORAL
  Filled 2017-12-10 (×2): qty 1

## 2017-12-10 MED ORDER — HYDROCHLOROTHIAZIDE 25 MG PO TABS: 25.0000 mg | ORAL_TABLET | Freq: Every day | ORAL | Status: DC

## 2017-12-10 MED ORDER — ACETAMINOPHEN 650 MG RE SUPP: 650.0000 mg | Freq: Four times a day (QID) | RECTAL | Status: DC | PRN

## 2017-12-10 MED ORDER — VANCOMYCIN HCL 10 G IV SOLR: 1250.0000 mg | INTRAVENOUS | Status: DC

## 2017-12-10 MED ORDER — ENOXAPARIN SODIUM 40 MG/0.4ML ~~LOC~~ SOLN
40.0000 mg | SUBCUTANEOUS | Status: DC
  Administered 2017-12-10 – 2017-12-11 (×2): 40 mg via SUBCUTANEOUS
  Filled 2017-12-10 (×3): qty 0.4

## 2017-12-10 MED ORDER — HYDROCHLOROTHIAZIDE 12.5 MG PO CAPS
12.5000 mg | ORAL_CAPSULE | Freq: Every day | ORAL | Status: DC
  Administered 2017-12-10 – 2017-12-11 (×2): 12.5 mg via ORAL
  Filled 2017-12-10 (×2): qty 1

## 2017-12-10 MED ORDER — PIPERACILLIN-TAZOBACTAM 3.375 G IVPB
3.3750 g | Freq: Three times a day (TID) | INTRAVENOUS | Status: DC
  Administered 2017-12-10 – 2017-12-11 (×5): 3.375 g via INTRAVENOUS
  Filled 2017-12-10 (×6): qty 50

## 2017-12-10 MED ORDER — LISINOPRIL-HYDROCHLOROTHIAZIDE 20-12.5 MG PO TABS: 1.0000 | ORAL_TABLET | Freq: Every day | ORAL | Status: DC

## 2017-12-10 MED ORDER — LIRAGLUTIDE 18 MG/3ML ~~LOC~~ SOPN
1.8000 mg | PEN_INJECTOR | Freq: Every day | SUBCUTANEOUS | Status: DC
  Administered 2017-12-10: 1.8 mg via SUBCUTANEOUS

## 2017-12-10 MED ORDER — ROPINIROLE HCL 1 MG PO TABS
0.5000 mg | ORAL_TABLET | Freq: Every day | ORAL | Status: DC
  Administered 2017-12-10: 0.5 mg via ORAL
  Filled 2017-12-10: qty 1

## 2017-12-10 MED ORDER — INSULIN ASPART 100 UNIT/ML ~~LOC~~ SOLN
0.0000 [IU] | Freq: Three times a day (TID) | SUBCUTANEOUS | Status: DC
  Administered 2017-12-10: 1 [IU] via SUBCUTANEOUS
  Administered 2017-12-10: 3 [IU] via SUBCUTANEOUS
  Administered 2017-12-11: 2 [IU] via SUBCUTANEOUS

## 2017-12-10 MED ORDER — POTASSIUM CHLORIDE CRYS ER 20 MEQ PO TBCR
20.0000 meq | EXTENDED_RELEASE_TABLET | Freq: Once | ORAL | Status: AC
  Administered 2017-12-10: 20 meq via ORAL
  Filled 2017-12-10: qty 1

## 2017-12-10 MED ORDER — INSULIN ASPART 100 UNIT/ML ~~LOC~~ SOLN: 0.0000 [IU] | Freq: Every day | SUBCUTANEOUS | Status: DC

## 2017-12-10 MED ORDER — SERTRALINE HCL 50 MG PO TABS
150.0000 mg | ORAL_TABLET | Freq: Every day | ORAL | Status: DC
  Administered 2017-12-10 – 2017-12-11 (×2): 150 mg via ORAL
  Filled 2017-12-10 (×3): qty 1

## 2017-12-10 MED ORDER — ATORVASTATIN CALCIUM 20 MG PO TABS
20.0000 mg | ORAL_TABLET | Freq: Every day | ORAL | Status: DC
  Administered 2017-12-10 – 2017-12-11 (×2): 20 mg via ORAL
  Filled 2017-12-10 (×2): qty 1

## 2017-12-10 MED ORDER — LISINOPRIL 20 MG PO TABS
20.0000 mg | ORAL_TABLET | Freq: Every day | ORAL | Status: DC
  Administered 2017-12-10 – 2017-12-11 (×2): 20 mg via ORAL
  Filled 2017-12-10 (×2): qty 1

## 2017-12-10 MED ORDER — OMEGA-3-ACID ETHYL ESTERS 1 G PO CAPS
2.0000 g | ORAL_CAPSULE | Freq: Every day | ORAL | Status: DC
Start: 2017-12-10 — End: 2017-12-11
  Administered 2017-12-10 – 2017-12-11 (×2): 2 g via ORAL
  Filled 2017-12-10 (×2): qty 2

## 2017-12-10 MED ORDER — ACETAMINOPHEN 325 MG PO TABS: 650.0000 mg | ORAL_TABLET | Freq: Four times a day (QID) | ORAL | Status: DC | PRN

## 2017-12-10 NOTE — Telephone Encounter (Signed)
Please advise 

## 2017-12-10 NOTE — Telephone Encounter (Signed)
He will have to go back to the V-go pump as before, may need prescription for the 40 unit pump

## 2017-12-10 NOTE — Telephone Encounter (Signed)
This problem was addressed yesterday, please find out if this is a new problem,: Needs to start using his abdomen if there is a problem

## 2017-12-10 NOTE — H&P (Signed)
History and Physical    Alec [name] [id number]:[id number] DOB: Aug 18, 1946 DOA: 12/09/2017  PCP: Mayra Neer, MD  Patient coming from: Home.  Chief Complaint: Left arm erythema.  HPI: Alec [name] is a 72 y.o. male with history of diabetes mellitus type 2 on insulin pump, hypertension, hyperlipidemia and chronic kidney disease presents to the ER because of worsening redness in the left arm.  Patient had come to the ER 3 days ago after patient started developing mild erythema in the left arm where patient used his insulin pump.  Patient was discharged on oral antibiotics.  And was instructed to come to the ER if the redness worsens.  Despite taking antibiotics for a day patient's redness worsen and came to the ER.  It also is mild thickening of the skin.  Denies any fever chills.  Denies any insect bite or trauma.  ED Course: In the ER patient had mild achiness around the site which was incised and drained not much pus came out as per the ER physician.  No erythema of the skin noted on the left arm has gone beyond the marked spot during the recent ER visit.  Patient is started on IV antibiotics and admitted for further observation.  Review of Systems: As per HPI, rest all negative.   Past Medical History:  Diagnosis Date  . Degenerative arthritis of shoulder region 08/2013   left  . Disorder of bursae and tendons in left shoulder region 08/2013  . Hyperlipidemia   . Hypertension    under control with meds., has been on med. > 10 yr.  . IDDM (insulin dependent diabetes mellitus) ([place])     Past Surgical History:  Procedure Laterality Date  . CHOLECYSTECTOMY  08/29/2012   Procedure: LAPAROSCOPIC CHOLECYSTECTOMY WITH INTRAOPERATIVE CHOLANGIOGRAM;  Surgeon: Imogene Burn. Georgette Dover, MD;  Location: [facility];  Service: General;  Laterality: N/A;  . SHOULDER ARTHROSCOPY WITH SUBACROMIAL DECOMPRESSION, ROTATOR CUFF REPAIR AND BICEP TENDON REPAIR Left 09/03/2013   Procedure: LEFT SHOULDER ARTHROSCOPY  WITH EXTENSIVE DEBRIDMENT, DISTAL CLAVICULECTOMY, ROTATOR CUFF REPAIR AND SUBACROMIAL DECOMPRESSION PARTIAL ACRIOMIOPLASTY WITH CORACOACROMIAL RELEASE;  Surgeon: Renette Butters, MD;  Location: [facility];  Service: Orthopedics;  Laterality: Left;  . TRANSURETHRAL RESECTION OF BLADDER TUMOR WITH MITOMYCIN-C  08/24/2008     reports that he quit smoking about 20 years ago. he has never used smokeless tobacco. He reports that he does not drink alcohol or use drugs.  No Known Allergies  Family History  Problem Relation Age of Onset  . Diabetes Mother   . Cancer Father   . Coronary artery disease Father        MI at age 55.   . Sudden death Son   . CAD Maternal Grandfather        MI in his 75's  . CAD Paternal Grandfather     Prior to Admission medications   Medication Sig Start Date End Date Taking? Authorizing Provider  amLODipine (NORVASC) 10 MG tablet Take 10 mg by mouth daily.   Yes [provider]  aspirin 81 MG tablet Take 81 mg by mouth daily.   Yes [provider]  atorvastatin (LIPITOR) 20 MG tablet Take 20 mg by mouth daily.   Yes [provider]  cephALEXin (KEFLEX) 500 MG capsule Take 1 capsule (500 mg total) by mouth 4 (four) times daily. 12/08/17  Yes Molpus, John, MD  Cholecalciferol (VITAMIN D3) 3000 UNITS TABS Take 1 tablet by mouth daily.  Yes [provider]  Cinnamon 500 MG TABS Take 1,000 mg by mouth daily.   Yes [provider]  fish oil-omega-3 fatty acids 1000 MG capsule Take 2 g by mouth daily.   Yes [provider]  insulin aspart (NOVOLOG) 100 UNIT/ML injection INJECT 85 UNITS SUBCUTANEOUSLY ONCE DAILY WITH  INSULIN  PUMP Dx Code E11.65 11/20/17  Yes Kumar, Ajay, MD  Insulin Disposable Pump (OMNIPOD 5 PACK) MISC 1 each as directed. 12/08/17  Yes [provider]  lisinopril-hydrochlorothiazide (PRINZIDE,ZESTORETIC) 20-12.5 MG tablet Take 1 tablet by mouth daily.  07/09/17  Yes [provider]  Multiple Vitamin (MULTIVITAMIN) tablet Take 1 tablet by mouth daily.   Yes [provider]  rOPINIRole (REQUIP) 0.5 MG tablet TAKE 1 TABLET BY MOUTH AT BEDTIME 09/15/17  Yes Kumar, Ajay, MD  sertraline (ZOLOFT) 100 MG tablet Take 150 mg by mouth daily.    Yes [provider]  VICTOZA 18 MG/3ML SOPN INJECT  1.8 MG SUBCUTANEOUSLY ONCE DAILY AT THE SAME TIME EVERYDAY 10/27/17  Yes Kumar, Ajay, MD  gabapentin (NEURONTIN) 100 MG capsule Take 1 capsule (100 mg total) by mouth 3 (three) times daily. Patient not taking: Reported on 12/09/2017 07/25/17   Kumar, Ajay, MD  glucose blood (FREESTYLE LITE) test strip Use as instructed to test blood sugars 3 times daily. Dx code-E11.65 11/26/17   Kumar, Ajay, MD  Insulin Disposable Pump (V-GO 40) KIT USE ONE PER DAY Patient not taking: Reported on 11/26/2017 11/18/17   Kumar, Ajay, MD    Physical Exam: Vitals:   12/09/17 1729 12/09/17 1732 12/09/17 2056 12/10/17 0133  BP: 138/64  (!) 170/91 (!) 155/58  Pulse: 65  67 68  Resp:   18 16  Temp: 98.2 F (36.8 C)   98 F (36.7 C)  TempSrc: Oral   Oral  SpO2: 98%  98% 99%  Weight:  87.1 kg (192 lb)    Height:  5' 6" (1.676 m)        Constitutional: Moderately built and nourished. Vitals:   12/09/17 1729 12/09/17 1732 12/09/17 2056 12/10/17 0133  BP: 138/64  (!) 170/91 (!) 155/58  Pulse: 65  67 68  Resp:   18 16  Temp: 98.2 F (36.8 C)   98 F (36.7 C)  TempSrc: Oral   Oral  SpO2: 98%  98% 99%  Weight:  87.1 kg (192 lb)    Height:  5' 6" (1.676 m)     Eyes: Anicteric no pallor. ENMT: No discharge from the ears eyes nose or mouth. Neck: No mass felt.  No neck rigidity. Respiratory: No rhonchi or crepitations. Cardiovascular: S1-S2 heard no murmurs appreciated. Abdomen: Soft nontender bowel sounds present. Musculoskeletal: No edema.  Has mild tightness on moving the elbow but no definite restriction on the left side. Skin: Left arm has erythema extending up to  the mid. Neurologic: Alert awake oriented to time place and person.  Moves all extremities. Psychiatric: Appears normal.  Normal affect.   Labs on Admission: I have personally reviewed following labs and imaging studies  CBC: Recent Labs  Lab 12/07/17 2349 12/09/17 2120  WBC 6.7 6.1  NEUTROABS 4.5 4.2  HGB 13.4 13.3  HCT 37.5* 37.4*  MCV 87.8 87.6  PLT 126* 130*   Basic Metabolic Panel: Recent Labs  Lab 12/07/17 2349 12/09/17 2120  NA 140 140  K 3.7 3.2*  CL 107 105  CO2 25 25  GLUCOSE 144* 105*  BUN 20 24*  CREATININE   1.46* 1.45*  CALCIUM 9.4 9.4   GFR: Estimated Creatinine Clearance: 49.7 mL/min (A) (by C-G formula based on SCr of 1.45 mg/dL (H)). Liver Function Tests: No results for input(s): AST, ALT, ALKPHOS, BILITOT, PROT, ALBUMIN in the last 168 hours. No results for input(s): LIPASE, AMYLASE in the last 168 hours. No results for input(s): AMMONIA in the last 168 hours. Coagulation Profile: No results for input(s): INR, PROTIME in the last 168 hours. Cardiac Enzymes: No results for input(s): CKTOTAL, CKMB, CKMBINDEX, TROPONINI in the last 168 hours. BNP (last 3 results) No results for input(s): PROBNP in the last 8760 hours. HbA1C: No results for input(s): HGBA1C in the last 72 hours. CBG: Recent Labs  Lab 12/09/17 2212  GLUCAP 78   Lipid Profile: No results for input(s): CHOL, HDL, LDLCALC, TRIG, CHOLHDL, LDLDIRECT in the last 72 hours. Thyroid Function Tests: No results for input(s): TSH, T4TOTAL, FREET4, T3FREE, THYROIDAB in the last 72 hours. Anemia Panel: No results for input(s): VITAMINB12, FOLATE, FERRITIN, TIBC, IRON, RETICCTPCT in the last 72 hours. Urine analysis:    Component Value Date/Time   COLORURINE YELLOW 08/29/2012 0550   APPEARANCEUR CLEAR 08/29/2012 0550   LABSPEC 1.022 08/29/2012 0550   PHURINE 5.5 08/29/2012 0550   GLUCOSEU >1000 (A) 08/29/2012 0550   HGBUR NEGATIVE 08/29/2012 0550   BILIRUBINUR NEGATIVE 08/29/2012 0550     KETONESUR NEGATIVE 08/29/2012 0550   PROTEINUR 100 (A) 08/29/2012 0550   UROBILINOGEN 0.2 08/29/2012 0550   NITRITE NEGATIVE 08/29/2012 0550   LEUKOCYTESUR NEGATIVE 08/29/2012 0550   Sepsis Labs: _0 (procalcitonin:4,lacticidven:4) )No results found for this or any previous visit (from the past 240 hour(s)).   Radiological Exams on Admission: No results found.   Assessment/Plan Principal Problem:   Cellulitis of left arm Active Problems:   IDDM (insulin dependent diabetes mellitus) ([place])   Diabetic nephropathy associated with type 2 diabetes mellitus ([place])   Essential hypertension, benign    1. Cellulitis of the left arm -failed outpatient oral antibiotics and has been placed on IV vancomycin and Zosyn and admitted for observation. 2. Diabetes mellitus type 2 on insulin pump which will be continued along with Victoza. 3. Mild hypokalemia likely from diuretic use.  Replace and recheck. 4. Hypertension on lisinopril hydrochlorothiazide and amlodipine.  Follow metabolic panel.  Follow blood pressure trends. 5. Hyperlipidemia on statins. 6. Thrombocytopenia -reviewing patient's blood work patient has chronic thrombocytopenia and at times pancytopenia.  Follow CBC. 7. Chronic kidney disease stage II -creatinine appears to be at baseline.  Follow metabolic panel since patient is on lisinopril and hydrochlorothiazide.   DVT prophylaxis: Lovenox. Code Status: Full code. Family Communication: Discussed with patient. Disposition Plan: Home. Consults called: None. Admission status: Observation.   Rise Patience MD [facility] Pager [phone].  If 7PM-7AM, please contact night-coverage [email or web address] Password [facility]  12/10/2017, 2:53 AM

## 2017-12-10 NOTE — Care Management Obs Status (Signed)
[facility] NOTIFICATION   Patient Details  Name: Alec [name] MRN: [id number] Date of Birth: 1946/03/15   Medicare Observation Status Notification Given:  Yes    Dorance Spink, Benjaman Lobe, RN 12/10/2017, 12:13 PM

## 2017-12-10 NOTE — Progress Notes (Signed)
Pharmacy Antibiotic Note  Alec [name] is a 72 y.o. male admitted on 12/09/2017 with Cellulitis.  Pharmacy has been consulted for Vancomycin and Zosyn dosing.  Plan: Zosyn 3.375g IV q8h (4 hour infusion).   Vancomycin 1gm iv x1, then 1250mg  iv q36hr  Goal AUC = 400 - 500 for all indications, except meningitis (goal AUC > 500 and Cmin 15-20 mcg/mL)   Height: 5\' 6"  (167.6 cm) Weight: 192 lb (87.1 kg) IBW/kg (Calculated) : 63.8  Temp (24hrs), Avg:98.1 F (36.7 C), Min:98 F (36.7 C), Max:98.2 F (36.8 C)  Recent Labs  Lab 12/07/17 2349 12/09/17 2120 12/09/17 2136  WBC 6.7 6.1  --   CREATININE 1.46* 1.45*  --   LATICACIDVEN  --   --  0.76    Estimated Creatinine Clearance: 49.7 mL/min (A) (by C-G formula based on SCr of 1.45 mg/dL (H)).    No Known Allergies  Antimicrobials this admission: Vancomycin 12/09/2017 >> Zosyn 12/10/2017 >>   Dose adjustments this admission: -  Microbiology results: pending  Thank you for allowing pharmacy to be a part of this patient's care.  Nani Skillern Crowford 12/10/2017 4:43 AM

## 2017-12-10 NOTE — Progress Notes (Addendum)
Pharmacy Antibiotic Note  Alec [name] is a 72 y.o. male admitted on 12/09/2017 with cellulitis of the left arm.  Pharmacy has been consulted for Vancomycin and Zosyn dosing.  Plan: Adjust maintenance dose of Vancomycin to 1g IV q24h for improved renal function. Plan for Vancomycin levels at steady state, as indicated. Continue Zosyn 3.375g IV q8h (infuse over 4 hours). Monitor renal function (daily SCr), cultures, clinical course.   Height: 5\' 6"  (167.6 cm) Weight: 192 lb (87.1 kg) IBW/kg (Calculated) : 63.8  Temp (24hrs), Avg:98.4 F (36.9 C), Min:98 F (36.7 C), Max:98.9 F (37.2 C)  Recent Labs  Lab 12/07/17 2349 12/09/17 2120 12/09/17 2136 12/10/17 0426  WBC 6.7 6.1  --  5.8  CREATININE 1.46* 1.45*  --  1.26*  LATICACIDVEN  --   --  0.76  --     Estimated Creatinine Clearance: 57.2 mL/min (A) (by C-G formula based on SCr of 1.26 mg/dL (H)).    No Known Allergies  Antimicrobials this admission: 3/12 Vancomycin >> 3/13 Zosyn >>   Microbiology results: 3/12 BCx: sent  Thank you for allowing pharmacy to be a part of this patient's care.   Lindell Spar, PharmD, BCPS Pager: [phone] 12/10/2017 10:49 AM

## 2017-12-10 NOTE — Plan of Care (Signed)
  Pain Managment: General experience of comfort will improve 12/10/2017 2032 - Progressing by Mickie Kay, RN

## 2017-12-10 NOTE — Telephone Encounter (Signed)
Spoke with patient and he stated he is in the hospital at [facility]- he stated the omnipod gave him cellulitis and is not using the pod now please advise what the patient should do from this point- he does not know when he will be released but he had to have are lanced due to the swelling from the pod

## 2017-12-10 NOTE — ED Notes (Signed)
ED TO INPATIENT HANDOFF REPORT  Name/Age/Gender Alec [name] 72 y.o. male  Code Status Code Status History    Date Active Date Inactive Code Status Order ID Comments User Context   08/29/2012 17:54 08/30/2012 15:03 Full Code [id number]  Sonda Rumble, RN Inpatient   08/29/2012 11:40 08/29/2012 17:54 Full Code [id number]  Council, Lovena Le, RN Inpatient      Home/SNF/Other Home  Chief Complaint possible cellulitis   Level of Care/Admitting Diagnosis ED Disposition    ED Disposition Condition [place]: [facility] [834196]  Level of Care: Med-Surg [16]  Diagnosis: Cellulitis [222979]  Admitting Physician: Rise Patience [phone]  Attending Physician: Rise Patience [phone]  PT Class (Do Not Modify): Observation [104]  PT Acc Code (Do Not Modify): Observation [10022]       Medical History Past Medical History:  Diagnosis Date  . Degenerative arthritis of shoulder region 08/2013   left  . Disorder of bursae and tendons in left shoulder region 08/2013  . Hyperlipidemia   . Hypertension    under control with meds., has been on med. > 10 yr.  . IDDM (insulin dependent diabetes mellitus) ([place])     Allergies No Known Allergies  IV Location/Drains/Wounds Patient Lines/Drains/Airways Status   Active Line/Drains/Airways    Name:   Placement date:   Placement time:   Site:   Days:   Peripheral IV 12/09/17 Right Hand   12/09/17    2123    Hand   1   Incision 08/29/12 Abdomen   08/29/12    1414     1929   Incision 08/29/12 Abdomen Other (Comment)   08/29/12    1700     1929   Incision 09/03/13 Shoulder Left   09/03/13    1411     1559   Incision - 6 Ports Abdomen 1: Right 2: Right;Upper 3: Right;Upper 4: Umbilicus 5: Left 6: Left   08/29/12    -     1929          Labs/Imaging Results for orders placed or performed during the hospital encounter of 12/09/17 (from the past 48 hour(s))  Basic metabolic panel     Status:  Abnormal   Collection Time: 12/09/17  9:20 PM  Result Value Ref Range   Sodium 140 135 - 145 mmol/L   Potassium 3.2 (L) 3.5 - 5.1 mmol/L   Chloride 105 101 - 111 mmol/L   CO2 25 22 - 32 mmol/L   Glucose, Bld 105 (H) 65 - 99 mg/dL   BUN 24 (H) 6 - 20 mg/dL   Creatinine, Ser 1.45 (H) 0.61 - 1.24 mg/dL   Calcium 9.4 8.9 - 10.3 mg/dL   GFR calc non Af Amer 48 (L) >60 mL/min   GFR calc Af Amer 55 (L) >60 mL/min    Comment: (NOTE) The eGFR has been calculated using the CKD EPI equation. This calculation has not been validated in all clinical situations. eGFR's persistently <60 mL/min signify possible Chronic Kidney Disease.    Anion gap 10 5 - 15    Comment: Performed at [facility], [address], [place], [place] [address]  CBC with Differential     Status: Abnormal   Collection Time: 12/09/17  9:20 PM  Result Value Ref Range   WBC 6.1 4.0 - 10.5 K/uL   RBC 4.27 4.22 - 5.81 MIL/uL   Hemoglobin 13.3 13.0 - 17.0 g/dL   HCT  37.4 (L) 39.0 - 52.0 %   MCV 87.6 78.0 - 100.0 fL   MCH 31.1 26.0 - 34.0 pg   MCHC 35.6 30.0 - 36.0 g/dL   RDW 13.2 11.5 - 15.5 %   Platelets 130 (L) 150 - 400 K/uL   Neutrophils Relative % 69 %   Neutro Abs 4.2 1.7 - 7.7 K/uL   Lymphocytes Relative 22 %   Lymphs Abs 1.4 0.7 - 4.0 K/uL   Monocytes Relative 8 %   Monocytes Absolute 0.5 0.1 - 1.0 K/uL   Eosinophils Relative 1 %   Eosinophils Absolute 0.1 0.0 - 0.7 K/uL   Basophils Relative 0 %   Basophils Absolute 0.0 0.0 - 0.1 K/uL    Comment: Performed at [facility], [place] [address] [place], [place] [address]  I-Stat CG4 Lactic Acid, ED     Status: None   Collection Time: 12/09/17  9:36 PM  Result Value Ref Range   Lactic Acid, Venous 0.76 0.5 - 1.9 mmol/L  CBG monitoring, ED     Status: None   Collection Time: 12/09/17 10:12 PM  Result Value Ref Range   Glucose-Capillary 78 65 - 99 mg/dL   Comment 1 Notify RN    No results found.  Pending Labs Unresulted  Labs (From admission, onward)   Start     Ordered   12/09/17 2125  Culture, blood (routine x 2)  BLOOD CULTURE X 2,   STAT     12/09/17 2125      Vitals/Pain Today's Vitals   12/09/17 1729 12/09/17 1731 12/09/17 1732 12/09/17 2056  BP: 138/64   (!) 170/91  Pulse: 65   67  Resp:    18  Temp: 98.2 F (36.8 C)     TempSrc: Oral     SpO2: 98%   98%  Weight:   192 lb (87.1 kg)   Height:   '5\' 6"'$  (1.676 m)   PainSc:  5       Isolation Precautions No active isolations  Medications Medications  vancomycin (VANCOCIN) IVPB 1000 mg/200 mL premix (0 mg Intravenous Stopped 12/10/17 0002)  lidocaine-EPINEPHrine (XYLOCAINE W/EPI) 2 %-1:200000 (PF) injection 10 mL (10 mLs Infiltration Given 12/09/17 2226)  potassium chloride SA (K-DUR,KLOR-CON) CR tablet 40 mEq (40 mEq Oral Given 12/10/17 0017)    Mobility walks

## 2017-12-10 NOTE — Progress Notes (Signed)
Patient seen and evaluated, chart reviewed, please see EMR for updated orders. Please see full H&P dictated by admitting physician Dr Renetta Chalk for same date of service.    1) infected left arm insulin pump site with cellulitis-failed Keflex as outpatient, continue current Zosyn,  2)DM-continue insulin regimen    Patient seen and evaluated, chart reviewed, please see EMR for updated orders. Please see full H&P dictated by admitting physician Dr Renetta Chalk for same date of service.

## 2017-12-11 ENCOUNTER — Telehealth: Payer: Self-pay | Admitting: Endocrinology

## 2017-12-11 LAB — GLUCOSE, CAPILLARY
Glucose-Capillary: 53 mg/dL — ABNORMAL LOW (ref 65–99)
Glucose-Capillary: 92 mg/dL (ref 65–99)
Glucose-Capillary: 123 mg/dL — ABNORMAL HIGH (ref 65–99)
Glucose-Capillary: 154 mg/dL — ABNORMAL HIGH (ref 65–99)

## 2017-12-11 LAB — CREATININE, SERUM
Creatinine, Ser: 1.5 mg/dL — ABNORMAL HIGH (ref 0.61–1.24)
GFR calc Af Amer: 53 mL/min — ABNORMAL LOW (ref >60)
GFR calc non Af Amer: 46 mL/min — ABNORMAL LOW (ref >60)

## 2017-12-11 MED ORDER — DOXYCYCLINE HYCLATE 100 MG PO TABS: 100.0000 mg | ORAL_TABLET | Freq: Two times a day (BID) | ORAL | 0 refills | Status: DC

## 2017-12-11 NOTE — Progress Notes (Signed)
Hypoglycemic Event  CBG: 53  Treatment: 15 GM carbohydrate snack  Symptoms: Shaky  Follow-up CBG: [id number] CBG Result92  Possible Reasons for Event: medication   Comments/MD notified:na    Jaynie Crumble

## 2017-12-11 NOTE — Telephone Encounter (Signed)
Alec [name] from [facility] calling stated she haven't heard anything back from our office concerning patient insulin Novolog. Please advise  P# [phone]

## 2017-12-11 NOTE — Discharge Summary (Signed)
Alec [name], is a 72 y.o. male  DOB [birth year]  MRN [id number].  Admission date:  12/09/2017  Admitting Physician  Rise Patience, MD  Discharge Date:  12/11/2017   Primary MD  Mayra Neer, MD  Recommendations for primary care physician for things to follow:   1)Take Doxycycline 100 mg twice a day for 10 days for left arm infection/cellulitis, avoid excessive sun exposure while on doxycycline 2)Take all your other medications as prescribed 3)Follow-up with your primary care doctor within 7-10 days for recheck   Admission Diagnosis  History of diabetes mellitus [Z86.39] Cellulitis of left upper extremity [L03.114]   Discharge Diagnosis  History of diabetes mellitus [Z86.39] Cellulitis of left upper extremity [L03.114]    Principal Problem:   Cellulitis of left arm Active Problems:   IDDM (insulin dependent diabetes mellitus) ([facility])   Diabetic nephropathy associated with type 2 diabetes mellitus ([place])   Essential hypertension, benign   Cellulitis      Past Medical History:  Diagnosis Date  . Degenerative arthritis of shoulder region 08/2013   left  . Disorder of bursae and tendons in left shoulder region 08/2013  . Hyperlipidemia   . Hypertension    under control with meds., has been on med. > 10 yr.  . IDDM (insulin dependent diabetes mellitus) ([place])     Past Surgical History:  Procedure Laterality Date  . CHOLECYSTECTOMY  08/29/2012   Procedure: LAPAROSCOPIC CHOLECYSTECTOMY WITH INTRAOPERATIVE CHOLANGIOGRAM;  Surgeon: Imogene Burn. Georgette Dover, MD;  Location: [facility];  Service: General;  Laterality: N/A;  . SHOULDER ARTHROSCOPY WITH SUBACROMIAL DECOMPRESSION, ROTATOR CUFF REPAIR AND BICEP TENDON REPAIR Left 09/03/2013   Procedure: LEFT SHOULDER ARTHROSCOPY WITH EXTENSIVE DEBRIDMENT, DISTAL CLAVICULECTOMY, ROTATOR CUFF REPAIR AND SUBACROMIAL DECOMPRESSION PARTIAL ACRIOMIOPLASTY WITH  CORACOACROMIAL RELEASE;  Surgeon: Renette Butters, MD;  Location: [facility];  Service: Orthopedics;  Laterality: Left;  . TRANSURETHRAL RESECTION OF BLADDER TUMOR WITH MITOMYCIN-C  08/24/2008       HPI  from the history and physical done on the day of admission:     HPI: Alec [name] is a 72 y.o. male with history of diabetes mellitus type 2 on insulin pump, hypertension, hyperlipidemia and chronic kidney disease presents to the ER because of worsening redness in the left arm.  Patient had come to the ER 3 days ago after patient started developing mild erythema in the left arm where patient used his insulin pump.  Patient was discharged on oral antibiotics.  And was instructed to come to the ER if the redness worsens.  Despite taking antibiotics for a day patient's redness worsen and came to the ER.  It also is mild thickening of the skin.  Denies any fever chills.  Denies any insect bite or trauma.  ED Course: In the ER patient had mild achiness around the site which was incised and drained not much pus came out as per the ER physician.  No erythema of the skin noted on the left arm has gone beyond the  marked spot during the recent ER visit.  Patient is started on IV antibiotics and admitted for further observation.  Review of Systems: As per HPI, rest all negative.    Hospital Course:    1)Infected Left Arm insulin pump site with Cellulitis-  failed Keflex as outpatient, overall much improved with IV Vanco and Zosyn, no leukocytosis, no fevers, no chills, exam reveals no definite abscess.  May discharge home on doxycycline, follow-up with PCP as advised  2)DM-continue insulin regimen and Victoza  3)CKD II-renal function appears to be at baseline at this time, avoid nephrotoxic agents  4)HTN-stable, resume Lisinopril/ HCTZ and Amlodipine  Discharge Condition:  stable  Follow UP-   With PCP for Recheck within 1 week  Diet and Activity recommendation:  As  advised  Discharge Instructions    Discharge Instructions    Call MD for:  difficulty breathing, headache or visual disturbances   Complete by:  As directed    Call MD for:  persistant dizziness or light-headedness   Complete by:  As directed    Call MD for:  persistant nausea and vomiting   Complete by:  As directed    Call MD for:  redness, tenderness, or signs of infection (pain, swelling, redness, odor or green/yellow discharge around incision site)   Complete by:  As directed    Call MD for:  temperature >100.4   Complete by:  As directed    Diet Carb Modified   Complete by:  As directed    Discharge instructions   Complete by:  As directed    1)Take Doxycycline 100 mg twice a day for 10 days for left arm infection/cellulitis, avoid excessive sun exposure while on doxycycline 2)Take all your other medications as prescribed 3)Follow-up with your primary care doctor within 7-10 days for recheck   Discharge instructions   Complete by:  As directed    Avoid ibuprofen/Advil/Aleve/Motrin/Goody Powders/Naproxen/BC powders/Meloxicam/Diclofenac/Indomethacin and other Nonsteroidal anti-inflammatory medications as these will make you more likely to bleed and can cause stomach ulcers, can also cause Kidney problems.   Increase activity slowly   Complete by:  As directed        Discharge Medications     Allergies as of 12/11/2017   No Known Allergies     Medication List    STOP taking these medications   cephALEXin 500 MG capsule Commonly known as:  KEFLEX     TAKE these medications   amLODipine 10 MG tablet Commonly known as:  NORVASC Take 10 mg by mouth daily.   aspirin 81 MG tablet Take 81 mg by mouth daily.   atorvastatin 20 MG tablet Commonly known as:  LIPITOR Take 20 mg by mouth daily.   Cinnamon 500 MG Tabs Take 1,000 mg by mouth daily.   doxycycline 100 MG tablet Commonly known as:  VIBRA-TABS Take 1 tablet (100 mg total) by mouth 2 (two) times daily.    fish oil-omega-3 fatty acids 1000 MG capsule Take 2 g by mouth daily.   gabapentin 100 MG capsule Commonly known as:  NEURONTIN Take 1 capsule (100 mg total) by mouth 3 (three) times daily.   glucose blood test strip Commonly known as:  FREESTYLE LITE Use as instructed to test blood sugars 3 times daily. Dx code-E11.65   insulin aspart 100 UNIT/ML injection Commonly known as:  NOVOLOG INJECT 85 UNITS SUBCUTANEOUSLY ONCE DAILY WITH  INSULIN  PUMP Dx Code E11.65   lisinopril-hydrochlorothiazide 20-12.5 MG tablet Commonly known as:  PRINZIDE,ZESTORETIC Take  1 tablet by mouth daily.   multivitamin tablet Take 1 tablet by mouth daily.   rOPINIRole 0.5 MG tablet Commonly known as:  REQUIP TAKE 1 TABLET BY MOUTH AT BEDTIME   sertraline 100 MG tablet Commonly known as:  ZOLOFT Take 150 mg by mouth daily.   V-GO 40 Kit USE ONE PER DAY   OMNIPOD 5 PACK Misc 1 each as directed.   VICTOZA 18 MG/3ML Sopn Generic drug:  liraglutide INJECT  1.8 MG SUBCUTANEOUSLY ONCE DAILY AT THE SAME TIME EVERYDAY   Vitamin D3 3000 units Tabs Take 1 tablet by mouth daily.       Major procedures and Radiology Reports - PLEASE review detailed and final reports for all details, in brief -   No results found.  Micro Results   Recent Results (from the past 240 hour(s))  Culture, blood (routine x 2)     Status: None (Preliminary result)   Collection Time: 12/09/17  9:20 PM  Result Value Ref Range Status   Specimen Description   Final    BLOOD RIGHT HAND Performed at [facility] [address], [place], [place] [address]    Special Requests   Final    BOTTLES DRAWN AEROBIC AND ANAEROBIC Blood Culture adequate volume Performed at [facility] [address], [place], [place] [address]    Culture   Final    NO GROWTH < 24 HOURS Performed at [facility] [address] [place], [place] [address]    Report Status PENDING  Incomplete   Culture, blood (routine x 2)     Status: None (Preliminary result)   Collection Time: 12/09/17  9:20 PM  Result Value Ref Range Status   Specimen Description   Final    BLOOD RIGHT HAND Performed at [facility] [address], [place], [place] [address]    Special Requests   Final    BOTTLES DRAWN AEROBIC ONLY Blood Culture adequate volume Performed at [facility] [address], [place], [place] [address]    Culture   Final    NO GROWTH < 24 HOURS Performed at [facility] [address] [place], [place] [address]    Report Status PENDING  Incomplete       Today   Subjective    Alec [name] today has no new complaints,  No n/v/d, no f/c,    Patient has been seen and examined prior to discharge   Objective   Blood pressure 101/65, pulse 62, temperature 98.3 F (36.8 C), temperature source Oral, resp. rate 18, height '5\' 6"'$  (1.676 m), weight 87.1 kg (192 lb), SpO2 98 %.   Intake/Output Summary (Last 24 hours) at 12/11/2017 1319 Last data filed at 12/11/2017 1000 Gross per 24 hour  Intake 510 ml  Output 500 ml  Net 10 ml    Exam Gen:- Awake  In no apparent distress  HEENT:- [place].AT,   Neck-Supple Neck,No JVD,  Lungs- mostly clear  CV- S1, S2 normal Abd-  +ve B.Sounds, Abd Soft, No tenderness,    Extremity/Skin:- Intact peripheral pulses, Lt arm redness, swelling, warmt and  tenderness has improved significantly, no fluctuance, no drainage, no streaking         Psych-oriented x3, appropriate affect   Data Review   CBC w Diff:  Lab Results  Component Value Date   WBC 5.8 12/10/2017   HGB 12.8 (L) 12/10/2017   HCT 36.0 (L) 12/10/2017  PLT 121 (L) 12/10/2017   LYMPHOPCT 22 12/09/2017   MONOPCT 8 12/09/2017   EOSPCT 1 12/09/2017   BASOPCT 0 12/09/2017    CMP:  Lab Results  Component Value Date   NA 139 12/10/2017   K 3.6 12/10/2017   CL 108 12/10/2017   CO2 24 12/10/2017   BUN 20 12/10/2017    CREATININE 1.50 (H) 12/11/2017   PROT 7.1 07/21/2017   ALBUMIN 4.2 07/21/2017   BILITOT 0.9 07/21/2017   ALKPHOS 71 07/21/2017   AST 20 07/21/2017   ALT 26 07/21/2017    Total Discharge time is about 33 minutes  Roxan Hockey M.D on 12/11/2017 at 1:19 PM  [facility]   Office  [phone]  Voice Recognition Viviann Spare dictation system was used to create this note, attempts have been made to correct errors. Please contact the author with questions and/or clarifications.

## 2017-12-11 NOTE — Discharge Instructions (Signed)
1)Take Doxycycline 100 mg twice a day for 10 days for left arm infection/cellulitis, avoid excessive sun exposure while on doxycycline 2)Take All your other medications as prescribed 3)Follow-up with your Primary Care Doctor within 7-10 days for recheck  4)Avoid ibuprofen/Advil/Aleve/Motrin/Goody Powders/Naproxen/BC powders/Meloxicam/Diclofenac/Indomethacin and other Nonsteroidal anti-inflammatory medications as these will make you more likely to bleed and can cause stomach ulcers, can also cause Kidney problems.

## 2017-12-14 LAB — CULTURE, BLOOD (ROUTINE X 2)
Culture: NO GROWTH
Culture: NO GROWTH
Special Requests: ADEQUATE
Special Requests: ADEQUATE

## 2017-12-16 ENCOUNTER — Telehealth: Payer: Self-pay | Admitting: Endocrinology

## 2017-12-16 ENCOUNTER — Other Ambulatory Visit: Payer: Self-pay

## 2017-12-16 MED ORDER — INSULIN ASPART 100 UNIT/ML ~~LOC~~ SOLN: SUBCUTANEOUS | 3 refills | Status: DC

## 2017-12-16 NOTE — Telephone Encounter (Signed)
This has been resolved patient has switched to [facility] in [place]

## 2017-12-16 NOTE — Telephone Encounter (Signed)
Alec [name] from [facility] is calling on the status of Novolog insulin aspart (NOVOLOG) 100 UNIT/ML injection [[id number]]

## 2017-12-17 ENCOUNTER — Other Ambulatory Visit: Payer: Self-pay

## 2017-12-17 MED ORDER — INSULIN ASPART 100 UNIT/ML ~~LOC~~ SOLN: SUBCUTANEOUS | 3 refills | Status: DC

## 2017-12-17 NOTE — Telephone Encounter (Signed)
novolog ordered and sent to [facility]- patient notified

## 2017-12-18 DIAGNOSIS — L03114 Cellulitis of left upper limb: Secondary | ICD-10-CM | POA: Diagnosis not present

## 2017-12-26 ENCOUNTER — Other Ambulatory Visit: Payer: Self-pay

## 2017-12-26 ENCOUNTER — Telehealth: Payer: Self-pay | Admitting: Endocrinology

## 2017-12-26 MED ORDER — INSULIN ASPART 100 UNIT/ML ~~LOC~~ SOLN: SUBCUTANEOUS | 3 refills | Status: DC

## 2017-12-26 NOTE — Telephone Encounter (Signed)
Rx sent. See meds. Pt informed.  

## 2017-12-26 NOTE — Telephone Encounter (Signed)
Patient needs RX for Novalog sent to [facility] on [address] in [place], [place] Attn: Anderson Malta asap. Patient has been trying to get this handled for approx. 1 Month. ([facility] use [facility] but too expensive for the Nov[facility])

## 2017-12-26 NOTE — Telephone Encounter (Signed)
Done

## 2017-12-26 NOTE — Telephone Encounter (Signed)
Patient called and stated [facility] is still not receiving a prescription for his NOVOLOG.        insulin aspart (NOVOLOG) 100 UNIT/ML injection [[id number]]       [place], [place]

## 2017-12-29 ENCOUNTER — Ambulatory Visit: Payer: Medicare Other | Admitting: Endocrinology

## 2017-12-29 NOTE — Telephone Encounter (Signed)
Patient is still using Omnipod and he does not need refill at this time

## 2017-12-31 ENCOUNTER — Encounter: Payer: Self-pay | Admitting: Endocrinology

## 2017-12-31 ENCOUNTER — Telehealth: Payer: Self-pay | Admitting: Endocrinology

## 2017-12-31 ENCOUNTER — Ambulatory Visit (INDEPENDENT_AMBULATORY_CARE_PROVIDER_SITE_OTHER): Payer: Medicare Other | Admitting: Endocrinology

## 2017-12-31 VITALS — BP 142/70 | HR 66 | Ht 66.0 in | Wt 193.0 lb

## 2017-12-31 DIAGNOSIS — E1165 Type 2 diabetes mellitus with hyperglycemia: Secondary | ICD-10-CM | POA: Diagnosis not present

## 2017-12-31 DIAGNOSIS — Z794 Long term (current) use of insulin: Secondary | ICD-10-CM

## 2017-12-31 NOTE — Telephone Encounter (Signed)
Please call to let him know that we need to change his basal rate from 1 PM up to 6 PM down to 2.2 on his pump as I forgot to mention it

## 2017-12-31 NOTE — Patient Instructions (Signed)
Hibiclens antiseptic

## 2017-12-31 NOTE — Progress Notes (Signed)
Patient ID: Alec [name], male   DOB: 09/30/1946, 72 y.o.   MRN: [id number]             Reason for Appointment:  Follow-up for Type 2 Diabetes  History of Present Illness:          Diagnosis: Type 2 diabetes mellitus, date of diagnosis: 2001       Prior history: He was seen in consultation in 11/2014 when his A1c was 7.4 Since his blood sugars were averaging over 200 he was given Victoza in addition to his basal bolus insulin regimen in 6/16 He started on the V go pump in October 2016  Recent history:    INSULIN regimen is described as:  BASAL rate on Omnipod pump  Boluses 12 units lunch and dinner and 13 units before breakfast   Non-insulin hypoglycemic drugs the patient is taking are: Victoza 1.2 mg daily   His A1c in 1/19 was 7.5, compared to 6.6 on his last visit in October  Current blood sugar patterns from analysis of his meter download, daily management and problems identified:  He is on the [place] on his last visit his basal rates were increased round-the-clock  His fasting readings are much better than on his last visit although still mostly fairly good in the last week except for the last 2 days  His blood sugars have been higher fasting for a couple of days and 1 of them was related to eating dessert the night before without any bolus which caused prolonged hypoglycemia  LOWEST blood sugars are in the afternoon but has had only 1 or 2 low sugars documented between 3-6 PM  Also blood sugars are excellent around suppertime  He has not done any postprandial readings and is only occasionally higher after breakfast or lunch  Last night with his eating dessert without a bolus his blood sugar went up to over 300 and stayed high during the night  He has not been counting carbohydrates and is using preset boluses  The last 3 days he has used the [employer] sensor and appears to have relatively high readings after  BREAKFAST  He is requiring higher basal insulin amount compared to the V-go pump and is concerned about needing to change the pump more frequently; on the last visit his basal rates were increased around the clock  His weight is stable  He developed cellulitis at the site of the insulin pump last month on his arm and was admitted for antibiotics for 2 days  Side effects from medications have been:none  Compliance with the medical regimen: Good   Glucose monitoring:  Freestyle Neo or libre       Blood Glucose readings   Mean values apply above for all meters except median for One Touch  PRE-MEAL Fasting Lunch Dinner Bedtime Overall  Glucose range:  100-226  109-228  70-276 ?   53-279  Mean/median:  145  161  121   133+/-53   POST-MEAL PC Breakfast PC Lunch PC Dinner  Glucose range:   53-131   Mean/median:   115      Self-care: The diet that the patient has been following is:  variable recently  Meals: 3 meals per day. Lunch  1 pm Dinner 6 pm Breakfast is usually an egg sandwichbut sometimes biscuits   Eating a balanced meals in the evening.  He will have snacks on granola bars              Exercise:  minimal recently  Dietician visit, most recent: 6/15.               Weight history:  Wt Readings from Last 3 Encounters:  12/31/17 193 lb (87.5 kg)  12/09/17 192 lb (87.1 kg)  12/07/17 192 lb (87.1 kg)    Glycemic control:    Lab Results  Component Value Date   HGBA1C 7.5 (H) 10/23/2017   HGBA1C 6.6 (H) 07/21/2017   HGBA1C 6.3 04/04/2017   Lab Results  Component Value Date   MICROALBUR 5.2 (H) 07/21/2017   LDLCALC 54 03/28/2016   CREATININE 1.50 (H) 12/11/2017     Past history:  He was not symptomatic at time of diagnosis and was probably treated with Amaryl only.  No details are available of previous management However he thinks he was not given metformin at any time because of his "kidney problem" He had a A1c of over 9% in 2013 and may have been started  on insulin at that time. Most likely has been taking Levemir and NovoLog since then A1c records show his results were over 8% in 2014 and in 4/15 but subsequently down to 7.1 in November 2015 Amaryl was stopped in 7/16  Other active problems are addressed in Review of systems   Allergies as of 12/31/2017   No Known Allergies     Medication List        Accurate as of 12/31/17  8:52 PM. Always use your most recent med list.          amLODipine 10 MG tablet Commonly known as:  NORVASC Take 10 mg by mouth daily.   aspirin 81 MG tablet Take 81 mg by mouth daily.   atorvastatin 20 MG tablet Commonly known as:  LIPITOR Take 20 mg by mouth daily.   Cinnamon 500 MG Tabs Take 1,000 mg by mouth daily.   doxycycline 100 MG tablet Commonly known as:  VIBRA-TABS Take 1 tablet (100 mg total) by mouth 2 (two) times daily.   fish oil-omega-3 fatty acids 1000 MG capsule Take 2 g by mouth daily.   gabapentin 100 MG capsule Commonly known as:  NEURONTIN Take 1 capsule (100 mg total) by mouth 3 (three) times daily.   glucose blood test strip Commonly known as:  FREESTYLE LITE Use as instructed to test blood sugars 3 times daily. Dx code-E11.65   insulin aspart 100 UNIT/ML injection Commonly known as:  NOVOLOG INJECT 85 UNITS SUBCUTANEOUSLY ONCE DAILY WITH  INSULIN  PUMP Dx Code E11.65   lisinopril-hydrochlorothiazide 20-12.5 MG tablet Commonly known as:  PRINZIDE,ZESTORETIC Take 1 tablet by mouth daily.   multivitamin tablet Take 1 tablet by mouth daily.   rOPINIRole 0.5 MG tablet Commonly known as:  REQUIP TAKE 1 TABLET BY MOUTH AT BEDTIME   sertraline 100 MG tablet Commonly known as:  ZOLOFT Take 150 mg by mouth daily.   V-GO 40 Kit USE ONE PER DAY   OMNIPOD 5 PACK Misc 1 each as directed.   VICTOZA 18 MG/3ML Sopn Generic drug:  liraglutide INJECT  1.8 MG SUBCUTANEOUSLY ONCE DAILY AT THE SAME TIME EVERYDAY   Vitamin D3 3000 units Tabs Take 1 tablet by mouth  daily.       Allergies: No Known Allergies  Past Medical History:  Diagnosis Date  . Degenerative arthritis of shoulder region 08/2013   left  . Disorder of bursae and tendons in left shoulder region 08/2013  . Hyperlipidemia   . Hypertension    under control  with meds., has been on med. > 10 yr.  . IDDM (insulin dependent diabetes mellitus) ([facility])     Past Surgical History:  Procedure Laterality Date  . CHOLECYSTECTOMY  08/29/2012   Procedure: LAPAROSCOPIC CHOLECYSTECTOMY WITH INTRAOPERATIVE CHOLANGIOGRAM;  Surgeon: Imogene Burn. Georgette Dover, MD;  Location: [facility];  Service: General;  Laterality: N/A;  . SHOULDER ARTHROSCOPY WITH SUBACROMIAL DECOMPRESSION, ROTATOR CUFF REPAIR AND BICEP TENDON REPAIR Left 09/03/2013   Procedure: LEFT SHOULDER ARTHROSCOPY WITH EXTENSIVE DEBRIDMENT, DISTAL CLAVICULECTOMY, ROTATOR CUFF REPAIR AND SUBACROMIAL DECOMPRESSION PARTIAL ACRIOMIOPLASTY WITH CORACOACROMIAL RELEASE;  Surgeon: Renette Butters, MD;  Location: [facility];  Service: Orthopedics;  Laterality: Left;  . TRANSURETHRAL RESECTION OF BLADDER TUMOR WITH MITOMYCIN-C  08/24/2008    Family History  Problem Relation Age of Onset  . Diabetes Mother   . Cancer Father   . Coronary artery disease Father        MI at age 4.   . Sudden death Son   . CAD Maternal Grandfather        MI in his 36's  . CAD Paternal Grandfather     Social History:  reports that he quit smoking about 20 years ago. He has never used smokeless tobacco. He reports that he does not drink alcohol or use drugs.    Review of Systems       HYPERTENSION: has been treated with amlodipine 10 mg, and lisinopril HCT Blood pressure also followed by nephrologist       Lipids: he is taking Lipitor 20 mg, last triglycerides and LDL are normal in 1/19 with LDL 61        Lab Results  Component Value Date   CHOL 108 03/28/2016   HDL 28.80 (L) 03/28/2016   LDLCALC 54 03/28/2016   TRIG 130.0 03/28/2016   CHOLHDL 4  03/28/2016               CKD: He has been followed by nephrologist for diabetic nephropathy.   Last urine microalbumin ratio is normal His creatinine is mildly increased but not significantly different  His creatinine is follows:  Lab Results  Component Value Date   CREATININE 1.50 (H) 12/11/2017   CREATININE 1.26 (H) 12/10/2017   CREATININE 1.45 (H) 12/09/2017     Last foot exam in 7/18  Has neuropathic symptoms controlled with low-dose gabapentin, also on Requip from PCP  Eye exam last:  1/18   Physical Examination:  BP (!) 142/70 (BP Location: Left Arm, Patient Position: Sitting, Cuff Size: Normal)   Pulse 66   Ht '5\' 6"'$  (1.676 m)   Wt 193 lb (87.5 kg)   SpO2 97%   BMI 31.15 kg/m        ASSESSMENT:  Diabetes type 2, insulin requiring  See history of present illness for detailed discussion of his current management, blood sugar patterns and problems identified  His last A1c was 7.5 in January  His OmniPod insulin pump download was reviewed and recent Freestyle libre readings were also reviewed although he has only about 3 days on the last Freestyle usage His blood sugars are much better now but appears to be requiring higher basal rates than before  Has tendency to slightly high readings fasting but not consistent on also relatively low readings before suppertime with only occasional hypoglycemia Will be able to review his postprandial readings better on his next visit when he has more data on the [employer] Because of his eating relatively higher fat meal in the  morning he appears to be requiring large amount of bolus then currently programmed   He is also continuing the Victoza    PLAN:    He will increase the bolus at breakfast time to 14.5 instead of 13  No change in correction factor  Will reduce his basal rate between 1 PM and 6 PM down to 2.2 to avoid low sugars in the afternoon  Since he has a large supply of NovoLog will wait till he  finishes this to get his insulin changed to the U-200 Humalog if covered since he will not have the change his pod as often  Continue Victoza  More regular exercise  To check readings after evening meal consistently even when on the Freestyle libre sensor  He will need to also correlate his freestyle libre readings with his fingersticks  For his site preparation he will use Hibiclens instead of alcohol to potentially prevent any further skin infections at the pump site   Patient Instructions  Hibiclens antiseptic       Elayne Snare 12/31/2017, 8:52 PM   Note: This office note was prepared with Dragon voice recognition system technology. Any transcriptional errors that result from this process are unintentional.

## 2018-01-01 NOTE — Telephone Encounter (Signed)
Pt informed of below.  

## 2018-01-02 DIAGNOSIS — Z6831 Body mass index (BMI) 31.0-31.9, adult: Secondary | ICD-10-CM | POA: Diagnosis not present

## 2018-01-02 DIAGNOSIS — M2550 Pain in unspecified joint: Secondary | ICD-10-CM | POA: Diagnosis not present

## 2018-01-02 DIAGNOSIS — E669 Obesity, unspecified: Secondary | ICD-10-CM | POA: Diagnosis not present

## 2018-01-02 DIAGNOSIS — M7989 Other specified soft tissue disorders: Secondary | ICD-10-CM | POA: Diagnosis not present

## 2018-01-19 DIAGNOSIS — Z6831 Body mass index (BMI) 31.0-31.9, adult: Secondary | ICD-10-CM | POA: Diagnosis not present

## 2018-01-19 DIAGNOSIS — M0609 Rheumatoid arthritis without rheumatoid factor, multiple sites: Secondary | ICD-10-CM | POA: Diagnosis not present

## 2018-01-19 DIAGNOSIS — M2550 Pain in unspecified joint: Secondary | ICD-10-CM | POA: Diagnosis not present

## 2018-01-19 DIAGNOSIS — M1A09X0 Idiopathic chronic gout, multiple sites, without tophus (tophi): Secondary | ICD-10-CM | POA: Diagnosis not present

## 2018-01-19 DIAGNOSIS — E669 Obesity, unspecified: Secondary | ICD-10-CM | POA: Diagnosis not present

## 2018-01-19 DIAGNOSIS — M7989 Other specified soft tissue disorders: Secondary | ICD-10-CM | POA: Diagnosis not present

## 2018-01-23 ENCOUNTER — Other Ambulatory Visit: Payer: Self-pay

## 2018-01-23 ENCOUNTER — Telehealth: Payer: Self-pay | Admitting: Endocrinology

## 2018-01-23 MED ORDER — OMNIPOD CLASSIC PODS (GEN 3) MISC: 1.0000 | 3 refills | Status: DC

## 2018-01-23 NOTE — Telephone Encounter (Signed)
Ivin Booty called from [facility] regarding pt medication of Insulin Disposable Pump (OMNIPOD 5 PACK) MISC pt is changing every 48 hours.  Pharmacy is [facility], [place]

## 2018-01-26 ENCOUNTER — Other Ambulatory Visit: Payer: Self-pay

## 2018-01-26 ENCOUNTER — Other Ambulatory Visit: Payer: Self-pay | Admitting: Endocrinology

## 2018-01-26 MED ORDER — OMNIPOD CLASSIC PODS (GEN 3) MISC: 1.0000 | 3 refills | Status: DC

## 2018-01-26 NOTE — Telephone Encounter (Signed)
He will need 15 pods per month, instead of 10.  The order is written for changing q 2 days.  Not sure what she is needing either?

## 2018-01-26 NOTE — Telephone Encounter (Signed)
Please advise 

## 2018-02-02 ENCOUNTER — Telehealth: Payer: Self-pay | Admitting: Endocrinology

## 2018-02-02 ENCOUNTER — Other Ambulatory Visit: Payer: Self-pay

## 2018-02-02 MED ORDER — ROPINIROLE HCL 0.5 MG PO TABS: 0.5000 mg | ORAL_TABLET | Freq: Every day | ORAL | 3 refills | Status: DC

## 2018-02-02 NOTE — Telephone Encounter (Signed)
Medications sent to pharmacy

## 2018-02-02 NOTE — Telephone Encounter (Signed)
Pt called about needing a refill for rOPINIRole (REQUIP) 0.5 MG tablet.   Pharmacy is [id number] - [place], [place].  Call pt @ [phone]. Thank you!

## 2018-02-20 DIAGNOSIS — N183 Chronic kidney disease, stage 3 (moderate): Secondary | ICD-10-CM | POA: Diagnosis not present

## 2018-02-20 DIAGNOSIS — N2581 Secondary hyperparathyroidism of renal origin: Secondary | ICD-10-CM | POA: Diagnosis not present

## 2018-02-20 DIAGNOSIS — D631 Anemia in chronic kidney disease: Secondary | ICD-10-CM | POA: Diagnosis not present

## 2018-02-20 DIAGNOSIS — I129 Hypertensive chronic kidney disease with stage 1 through stage 4 chronic kidney disease, or unspecified chronic kidney disease: Secondary | ICD-10-CM | POA: Diagnosis not present

## 2018-03-04 ENCOUNTER — Other Ambulatory Visit (INDEPENDENT_AMBULATORY_CARE_PROVIDER_SITE_OTHER): Payer: Medicare Other

## 2018-03-04 DIAGNOSIS — E1165 Type 2 diabetes mellitus with hyperglycemia: Secondary | ICD-10-CM | POA: Diagnosis not present

## 2018-03-04 DIAGNOSIS — Z794 Long term (current) use of insulin: Secondary | ICD-10-CM

## 2018-03-04 LAB — COMPREHENSIVE METABOLIC PANEL
ALT: 32 U/L (ref 0–53)
AST: 22 U/L (ref 0–37)
Albumin: 4.2 g/dL (ref 3.5–5.2)
Alkaline Phosphatase: 64 U/L (ref 39–117)
BUN: 23 mg/dL (ref 6–23)
CO2: 27 mEq/L (ref 19–32)
Calcium: 10.1 mg/dL (ref 8.4–10.5)
Chloride: 105 mEq/L (ref 96–112)
Creatinine, Ser: 1.58 mg/dL — ABNORMAL HIGH (ref 0.40–1.50)
GFR: 46.36 mL/min — ABNORMAL LOW (ref >60.00)
Glucose, Bld: 154 mg/dL — ABNORMAL HIGH (ref 70–99)
Potassium: 4.1 mEq/L (ref 3.5–5.1)
Sodium: 139 mEq/L (ref 135–145)
Total Bilirubin: 0.8 mg/dL (ref 0.2–1.2)
Total Protein: 7.1 g/dL (ref 6.0–8.3)

## 2018-03-04 LAB — HEMOGLOBIN A1C: Hgb A1c MFr Bld: 7.5 % — ABNORMAL HIGH (ref 4.6–6.5)

## 2018-03-08 NOTE — Progress Notes (Signed)
Patient ID: Alec [name], male   DOB: 21-Sep-1946, 72 y.o.   MRN: [id number]             Reason for Appointment:  Follow-up for Type 2 Diabetes  History of Present Illness:          Diagnosis: Type 2 diabetes mellitus, date of diagnosis: 2001       Prior history: He was seen in consultation in 11/2014 when his A1c was 7.4 Since his blood sugars were averaging over 200 he was given Victoza in addition to his basal bolus insulin regimen in 6/16 He started on the V go pump in October 2016  Recent history:    INSULIN regimen is described as:  BASAL rate on Omnipod pump 2.2 Boluses 12 units lunch and dinner and 14.5 units before breakfast   Non-insulin hypoglycemic drugs the patient is taking are: Victoza 1.8 mg daily   His A1c in 1/19 was 7.5, compared to 6.6 on his last visit in October  Current blood sugar patterns from analysis of his meter download, daily management and problems identified:  Although his blood sugars are not variable compared to the last time he is not using the [employer] and only his fingersticks  Also has no readings being done after supper  Currently not adjusting his BOLUSES based on what he is eating and his diet appears to be somewhat variable with some high readings after lunch  Also he has only one reading after supper which was significantly high  He is taking 14 units to cover his breakfast but less for his evening meal and currently is using only the programmed settings meter without any adjustment for his meals  Last night he had 2 cheeseburgers and a few [place] fries and did not adjust his insulin for that  He is using his Omnipod pump in his abdomen because of episode of cellulitis with using it on his arm  He is concerned about the cost of Victoza and also the pump  Since he is using about 88 units a day he is needing to change his pod every 2 days or so  Although he is a little more active he  is not able to lose weight  Side effects from medications have been:none  Compliance with the medical regimen: Fair  Glucose monitoring:  Freestyle       Blood Glucose readings  Mean values apply above for all meters except median for One Touch  PRE-MEAL Fasting Lunch Dinner Bedtime Overall  Glucose range: 102-251  17-264  70-233  70-264  Mean/median:  151  127  132  144   POST-MEAL PC Breakfast PC Lunch PC Dinner  Glucose range:   241  Mean/median:      Previous readings   Mean values apply above for all meters except median for One Touch  PRE-MEAL Fasting Lunch Dinner Bedtime Overall  Glucose range:  100-226  109-228  70-276 ?   53-279  Mean/median:  145  161  121   133+/-53   POST-MEAL PC Breakfast PC Lunch PC Dinner  Glucose range:   53-131   Mean/median:   115      Self-care: The diet that the patient has been following is:  variable recently  Meals: 3 meals per day. Lunch  1 pm Dinner 6 pm Breakfast is usually an egg sandwichbut sometimes biscuits   Eating a balanced meals in the evening.  He will have snacks on granola bars  Exercise: walking recently  Dietician visit, most recent: 6/15.               Weight history:  Wt Readings from Last 3 Encounters:  03/09/18 193 lb (87.5 kg)  12/31/17 193 lb (87.5 kg)  12/09/17 192 lb (87.1 kg)    Glycemic control:    Lab Results  Component Value Date   HGBA1C 7.5 (H) 03/04/2018   HGBA1C 7.5 (H) 10/23/2017   HGBA1C 6.6 (H) 07/21/2017   Lab Results  Component Value Date   MICROALBUR 5.2 (H) 07/21/2017   LDLCALC 54 03/28/2016   CREATININE 1.58 (H) 03/04/2018     Past history:  He was not symptomatic at time of diagnosis and was probably treated with Amaryl only.  No details are available of previous management However he thinks he was not given metformin at any time because of his "kidney problem" He had a A1c of over 9% in 2013 and may have been started on insulin at that time. Most likely  has been taking Levemir and NovoLog since then A1c records show his results were over 8% in 2014 and in 4/15 but subsequently down to 7.1 in November 2015 Amaryl was stopped in 7/16  Other active problems are addressed in Review of systems   Allergies as of 03/09/2018   No Known Allergies     Medication List        Accurate as of 03/09/18  8:37 AM. Always use your most recent med list.          amLODipine 10 MG tablet Commonly known as:  NORVASC Take 10 mg by mouth daily.   aspirin 81 MG tablet Take 81 mg by mouth daily.   atorvastatin 20 MG tablet Commonly known as:  LIPITOR Take 20 mg by mouth daily.   Cinnamon 500 MG Tabs Take 1,000 mg by mouth daily.   fish oil-omega-3 fatty acids 1000 MG capsule Take 2 g by mouth daily.   gabapentin 100 MG capsule Commonly known as:  NEURONTIN Take 1 capsule (100 mg total) by mouth 3 (three) times daily.   glucose blood test strip Commonly known as:  FREESTYLE LITE Use as instructed to test blood sugars 3 times daily. Dx code-E11.65   insulin aspart 100 UNIT/ML injection Commonly known as:  NOVOLOG INJECT 85 UNITS SUBCUTANEOUSLY ONCE DAILY WITH  INSULIN  PUMP Dx Code E11.65   lisinopril-hydrochlorothiazide 20-12.5 MG tablet Commonly known as:  PRINZIDE,ZESTORETIC Take 1 tablet by mouth daily.   multivitamin tablet Take 1 tablet by mouth daily.   OMNIPOD 5 PACK Misc Place 1 each onto the skin as directed. Place 1 onto skin as directed every 48 hours   rOPINIRole 0.5 MG tablet Commonly known as:  REQUIP Take 1 tablet (0.5 mg total) by mouth at bedtime.   sertraline 100 MG tablet Commonly known as:  ZOLOFT Take 150 mg by mouth daily.   VICTOZA 18 MG/3ML Sopn Generic drug:  liraglutide INJECT  1.8 MG SUBCUTANEOUSLY ONCE DAILY AT THE SAME TIME EVERYDAY   Vitamin D3 3000 units Tabs Take 1 tablet by mouth daily.       Allergies: No Known Allergies  Past Medical History:  Diagnosis Date  . Degenerative  arthritis of shoulder region 08/2013   left  . Disorder of bursae and tendons in left shoulder region 08/2013  . Hyperlipidemia   . Hypertension    under control with meds., has been on med. > 10 yr.  . IDDM (insulin dependent  diabetes mellitus) ([place])     Past Surgical History:  Procedure Laterality Date  . CHOLECYSTECTOMY  08/29/2012   Procedure: LAPAROSCOPIC CHOLECYSTECTOMY WITH INTRAOPERATIVE CHOLANGIOGRAM;  Surgeon: Imogene Burn. Georgette Dover, MD;  Location: [facility];  Service: General;  Laterality: N/A;  . SHOULDER ARTHROSCOPY WITH SUBACROMIAL DECOMPRESSION, ROTATOR CUFF REPAIR AND BICEP TENDON REPAIR Left 09/03/2013   Procedure: LEFT SHOULDER ARTHROSCOPY WITH EXTENSIVE DEBRIDMENT, DISTAL CLAVICULECTOMY, ROTATOR CUFF REPAIR AND SUBACROMIAL DECOMPRESSION PARTIAL ACRIOMIOPLASTY WITH CORACOACROMIAL RELEASE;  Surgeon: Renette Butters, MD;  Location: [place];  Service: Orthopedics;  Laterality: Left;  . TRANSURETHRAL RESECTION OF BLADDER TUMOR WITH MITOMYCIN-C  08/24/2008    Family History  Problem Relation Age of Onset  . Diabetes Mother   . Cancer Father   . Coronary artery disease Father        MI at age 71.   . Sudden death Son   . CAD Maternal Grandfather        MI in his 99's  . CAD Paternal Grandfather     Social History:  reports that he quit smoking about 20 years ago. He has never used smokeless tobacco. He reports that he does not drink alcohol or use drugs.    Review of Systems       HYPERTENSION: has been treated by his PCP with amlodipine 10 mg, and lisinopril HCT Blood pressure also followed by nephrologist       Lipids: he is taking Lipitor 20 mg from his PCP, last triglycerides and LDL are normal in 1/19 with LDL 61        Lab Results  Component Value Date   CHOL 108 03/28/2016   HDL 28.80 (L) 03/28/2016   LDLCALC 54 03/28/2016   TRIG 130.0 03/28/2016   CHOLHDL 4 03/28/2016               CKD: He has been followed by nephrologist for  diabetic nephropathy.   Last urine microalbumin ratio is normal His creatinine is mildly increased as before  His creatinine is follows:  Lab Results  Component Value Date   CREATININE 1.58 (H) 03/04/2018   CREATININE 1.50 (H) 12/11/2017   CREATININE 1.26 (H) 12/10/2017     Last foot exam in 09/2017  Has neuropathic symptoms controlled with low-dose gabapentin, also on Requip from PCP  Eye exam last:  1/19   Physical Examination:  BP 122/76 (BP Location: Left Arm, Patient Position: Sitting, Cuff Size: Normal)   Pulse 67   Ht 5\' 6"  (1.676 m)   Wt 193 lb (87.5 kg)   SpO2 97%   BMI 31.15 kg/m        ASSESSMENT:  Diabetes type 2, insulin requiring  See history of present illness for detailed discussion of his current management, blood sugar patterns and problems identified  His last A1c was 7.5 in January, is the same now,   His OmniPod insulin pump download was reviewed Although he was using the freestyle libre system previously he does not understand the need to use this for more complete management of his insulin and pump settings Although his blood sugars are on an average only 144 he is likely having high postprandial readings and possibly overnight readings may be higher Also most likely needs more insulin at suppertime although limited data available Discussed adjusting mealtime boluses based on meal size and carbohydrate and fat intake   He is also continuing the Victoza 1.8 mg daily but not clear if this is always controlling his  portions, previously this had helped his control  HYPERTENSION: Well controlled  PLAN:    He will increase the bolus at dinner time to 14 instead of 12 units and this was programmed  No change in basal rate  He did start using the freestyle libre sensor  Discussed that he will need to check his sugar at bedtime daily and if he sees blood sugars consistently higher or lower after supper he can adjust his suppertime boluses  again  He needs to also bolus 14 units for dinner daily and again adjust further up or down 2 units or more for smaller or larger meals are higher fat foods  He will also look at his blood sugars overnight and call us if they are consistently low on his sensor  Review again in 3 months  Try to have consistently low fat meals  He can cut back on the Victoza dosage down to 1.2 units since it is expensive for him   Patient Instructions  Victoza 1.2mg  daily    Counseling time on subjects discussed in assessment and plan sections is over 50% of today's 25 minute visit   Elayne Snare 03/09/2018, 8:37 AM   Note: This office note was prepared with Dragon voice recognition system technology. Any transcriptional errors that result from this process are unintentional.

## 2018-03-09 ENCOUNTER — Ambulatory Visit (INDEPENDENT_AMBULATORY_CARE_PROVIDER_SITE_OTHER): Payer: Medicare Other | Admitting: Endocrinology

## 2018-03-09 ENCOUNTER — Encounter: Payer: Self-pay | Admitting: Endocrinology

## 2018-03-09 VITALS — BP 122/76 | HR 67 | Ht 66.0 in | Wt 193.0 lb

## 2018-03-09 DIAGNOSIS — N183 Chronic kidney disease, stage 3 unspecified: Secondary | ICD-10-CM

## 2018-03-09 DIAGNOSIS — Z794 Long term (current) use of insulin: Secondary | ICD-10-CM | POA: Diagnosis not present

## 2018-03-09 DIAGNOSIS — E1165 Type 2 diabetes mellitus with hyperglycemia: Secondary | ICD-10-CM

## 2018-03-09 NOTE — Patient Instructions (Signed)
Victoza 1.2mg  daily

## 2018-03-19 DIAGNOSIS — E669 Obesity, unspecified: Secondary | ICD-10-CM | POA: Diagnosis not present

## 2018-03-19 DIAGNOSIS — M2550 Pain in unspecified joint: Secondary | ICD-10-CM | POA: Diagnosis not present

## 2018-03-19 DIAGNOSIS — M7989 Other specified soft tissue disorders: Secondary | ICD-10-CM | POA: Diagnosis not present

## 2018-03-19 DIAGNOSIS — M0609 Rheumatoid arthritis without rheumatoid factor, multiple sites: Secondary | ICD-10-CM | POA: Diagnosis not present

## 2018-03-19 DIAGNOSIS — Z6831 Body mass index (BMI) 31.0-31.9, adult: Secondary | ICD-10-CM | POA: Diagnosis not present

## 2018-03-19 DIAGNOSIS — M1A09X0 Idiopathic chronic gout, multiple sites, without tophus (tophi): Secondary | ICD-10-CM | POA: Diagnosis not present

## 2018-03-23 ENCOUNTER — Telehealth: Payer: Self-pay | Admitting: Cardiology

## 2018-03-23 NOTE — Telephone Encounter (Signed)
New Message:       Pt c/o Shortness Of Breath: STAT if SOB developed within the last 24 hours or pt is noticeably SOB on the phone  1. Are you currently SOB (can you hear that pt is SOB on the phone)? Pt's spouse  2. How long have you been experiencing SOB? Couple of weeks but has gotten  3. Are you SOB when sitting or when up moving around? Moving around  4. Are you currently experiencing any other symptoms? No

## 2018-03-23 NOTE — Telephone Encounter (Signed)
Spoke with pt, he feels like he is getting more SOB and tired faster than previous. He denies any chest pain. He can have swelling by the end of the day in his feet but denies orthopnea. He has a follow up appointment with dr hochrein and feels he will be fine until that appt. Patient voiced understanding if symptoms change or worsen prior to appt.

## 2018-03-25 DIAGNOSIS — R05 Cough: Secondary | ICD-10-CM | POA: Diagnosis not present

## 2018-04-07 ENCOUNTER — Other Ambulatory Visit: Payer: Self-pay | Admitting: Endocrinology

## 2018-04-20 DIAGNOSIS — I129 Hypertensive chronic kidney disease with stage 1 through stage 4 chronic kidney disease, or unspecified chronic kidney disease: Secondary | ICD-10-CM | POA: Diagnosis not present

## 2018-04-20 DIAGNOSIS — E782 Mixed hyperlipidemia: Secondary | ICD-10-CM | POA: Diagnosis not present

## 2018-04-20 DIAGNOSIS — N2581 Secondary hyperparathyroidism of renal origin: Secondary | ICD-10-CM | POA: Diagnosis not present

## 2018-04-20 DIAGNOSIS — Z0000 Encounter for general adult medical examination without abnormal findings: Secondary | ICD-10-CM | POA: Diagnosis not present

## 2018-04-20 DIAGNOSIS — N183 Chronic kidney disease, stage 3 (moderate): Secondary | ICD-10-CM | POA: Diagnosis not present

## 2018-04-20 DIAGNOSIS — M150 Primary generalized (osteo)arthritis: Secondary | ICD-10-CM | POA: Diagnosis not present

## 2018-04-20 DIAGNOSIS — F411 Generalized anxiety disorder: Secondary | ICD-10-CM | POA: Diagnosis not present

## 2018-04-20 DIAGNOSIS — C61 Malignant neoplasm of prostate: Secondary | ICD-10-CM | POA: Diagnosis not present

## 2018-04-20 DIAGNOSIS — E1122 Type 2 diabetes mellitus with diabetic chronic kidney disease: Secondary | ICD-10-CM | POA: Diagnosis not present

## 2018-04-20 DIAGNOSIS — G2581 Restless legs syndrome: Secondary | ICD-10-CM | POA: Diagnosis not present

## 2018-04-20 DIAGNOSIS — M109 Gout, unspecified: Secondary | ICD-10-CM | POA: Diagnosis not present

## 2018-04-20 DIAGNOSIS — Z794 Long term (current) use of insulin: Secondary | ICD-10-CM | POA: Diagnosis not present

## 2018-04-23 NOTE — Progress Notes (Signed)
Cardiology Office Note   Date:  04/24/2018   ID:  Alec [name], DOB Nov 29, 1945, MRN [id number]  PCP:  Mayra Neer, MD  Cardiologist:   No primary care provider on file.   Chief Complaint  Patient presents with  . Chest Pain      History of Present Illness: Alec [name] is a 72 y.o. male who presents for follow up of chest pain.  He was seen last year for this.  He had a perfusion study with a mild apical defect that was felt to be artifact versus low risk.  He came back for 1 year follow-up to review.  He is not particularly active though he still working.  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  He reports this has been a stable pattern.  He does not have any resting complaints and he has no shortness of breath, PND or orthopnea.  He does have poorly controlled diabetes and is not particularly active.  It does not seem like he is watching his diet.   Past Medical History:  Diagnosis Date  . Degenerative arthritis of shoulder region 08/2013   left  . Disorder of bursae and tendons in left shoulder region 08/2013  . Hyperlipidemia   . Hypertension    under control with meds., has been on med. > 10 yr.  . IDDM (insulin dependent diabetes mellitus) ([place])     Past Surgical History:  Procedure Laterality Date  . CHOLECYSTECTOMY  08/29/2012   Procedure: LAPAROSCOPIC CHOLECYSTECTOMY WITH INTRAOPERATIVE CHOLANGIOGRAM;  Surgeon: Imogene Burn. Georgette Dover, MD;  Location: [facility];  Service: General;  Laterality: N/A;  . SHOULDER ARTHROSCOPY WITH SUBACROMIAL DECOMPRESSION, ROTATOR CUFF REPAIR AND BICEP TENDON REPAIR Left 09/03/2013   Procedure: LEFT SHOULDER ARTHROSCOPY WITH EXTENSIVE DEBRIDMENT, DISTAL CLAVICULECTOMY, ROTATOR CUFF REPAIR AND SUBACROMIAL DECOMPRESSION PARTIAL ACRIOMIOPLASTY WITH CORACOACROMIAL RELEASE;  Surgeon: Renette Butters, MD;  Location: [place];  Service: Orthopedics;  Laterality: Left;  .  TRANSURETHRAL RESECTION OF BLADDER TUMOR WITH MITOMYCIN-C  08/24/2008     Current Outpatient Medications  Medication Sig Dispense Refill  . amLODipine (NORVASC) 10 MG tablet Take 10 mg by mouth daily.    Marland Kitchen aspirin 81 MG tablet Take 81 mg by mouth daily.    Marland Kitchen atorvastatin (LIPITOR) 20 MG tablet Take 20 mg by mouth daily.    Marland Kitchen buPROPion (WELLBUTRIN XL) 150 MG 24 hr tablet Take 150 mg by mouth daily.    . Cholecalciferol (VITAMIN D3) 3000 UNITS TABS Take 1 tablet by mouth daily.    . fish oil-omega-3 fatty acids 1000 MG capsule Take 2 g by mouth daily.    Marland Kitchen glucose blood (FREESTYLE LITE) test strip USE AS INSTRUCTED TO TEST BLOOD SUGARS 3 TIMES DAILY. DX CODE-E11.65 100 each 3  . insulin aspart (NOVOLOG) 100 UNIT/ML injection INJECT 85 UNITS SUBCUTANEOUSLY ONCE DAILY WITH  INSULIN  PUMP Dx Code E11.65 70 mL 3  . Insulin Disposable Pump (OMNIPOD 5 PACK) MISC Place 1 each onto the skin as directed. Place 1 onto skin as directed every 48 hours 15 each 3  . lisinopril-hydrochlorothiazide (PRINZIDE,ZESTORETIC) 20-12.5 MG tablet Take 1 tablet by mouth daily.     . Multiple Vitamin (MULTIVITAMIN) tablet Take 1 tablet by mouth daily.    Marland Kitchen rOPINIRole (REQUIP) 0.5 MG tablet Take 1 tablet (0.5 mg total) by mouth at bedtime. 30 tablet 3  . sertraline (ZOLOFT) 100 MG tablet Take  150 mg by mouth daily.     Marland Kitchen VICTOZA 18 MG/3ML SOPN INJECT  1.8 MG SUBCUTANEOUSLY ONCE DAILY AT THE SAME TIME EVERYDAY (Patient taking differently: 1.2 mg daily. INJECT  1.2 MG SUBCUTANEOUSLY ONCE DAILY AT THE SAME TIME EVERYDAY) 6 pen 1   No current facility-administered medications for this visit.     Allergies:   Patient has no known allergies.     ROS:  Please see the history of present illness.   Otherwise, review of systems are positive for none.   All other systems are reviewed and negative.    PHYSICAL EXAM: VS:  BP 120/68   Pulse (!) 59   Ht 5\' 6"  (1.676 m)   Wt 195 lb (88.5 kg)   BMI 31.47 kg/m  , BMI Body mass  index is 31.47 kg/m. GENERAL:  Well appearing HEENT:  Pupils equal round and reactive, fundi not visualized, oral mucosa unremarkable NECK:  No jugular venous distention, waveform within normal limits, carotid upstroke brisk and symmetric, no bruits, no thyromegaly LYMPHATICS:  No cervical, inguinal adenopathy LUNGS:  Clear to auscultation bilaterally BACK:  No CVA tenderness CHEST:  Unremarkable HEART:  PMI not displaced or sustained,S1 and S2 within normal limits, no S3, no S4, no clicks, no rubs, no murmurs ABD:  Flat, positive bowel sounds normal in frequency in pitch, no bruits, no rebound, no guarding, no midline pulsatile mass, no hepatomegaly, no splenomegaly EXT:  2 plus pulses throughout, no edema, no cyanosis no clubbing SKIN:  No rashes no nodules NEURO:  Cranial nerves II through XII grossly intact, motor grossly intact throughout PSYCH:  Cognitively intact, oriented to person place and time    EKG:  EKG is ordered today. The ekg ordered today demonstrates Sinus rhythm, rate 59, axis within normal limits, intervals within normal limits, no ST-T wave changes.   Recent Labs: 12/10/2017: Hemoglobin 12.8; Platelets 121 03/04/2018: ALT 32; BUN 23; Creatinine, Ser 1.58; Potassium 4.1; Sodium 139    Lipid Panel    Component Value Date/Time   CHOL 108 03/28/2016 0803   TRIG 130.0 03/28/2016 0803   HDL 28.80 (L) 03/28/2016 0803   CHOLHDL 4 03/28/2016 0803   VLDL 26.0 03/28/2016 0803   LDLCALC 54 03/28/2016 0803   Lab Results  Component Value Date   CREATININE 1.58 (H) 03/04/2018   Lab Results  Component Value Date   HGBA1C 7.5 (H) 03/04/2018     Wt Readings from Last 3 Encounters:  04/24/18 195 lb (88.5 kg)  03/09/18 193 lb (87.5 kg)  12/31/17 193 lb (87.5 kg)      Other studies Reviewed: Additional studies/ records that were reviewed today include: Lexiscan Myoview.. Review of the above records demonstrates:  Please see elsewhere in the note.      ASSESSMENT AND PLAN:  CHEST PAIN: He does have some chest discomfort.  It seems to be stable but he has significant cardiovascular risk factors.  I will bring the patient back for a POET (Plain Old Exercise Test). This will allow me to screen for obstructive coronary disease, risk stratify and very importantly provide a prescription for exercise.  HTN:  The blood pressure is at target. No change in medications is indicated. We will continue with therapeutic lifestyle changes (TLC).  HYPERLIPIDEMIA:    His lipids are excellent.    DM:   Diabetes is not well controlled.  I will defer follow up to Mayra Neer, MD  CKD III:   Creat is as above.  He is followed by Dr. Posey Pronto.       Current medicines are reviewed at length with the patient today.  The patient does not have concerns regarding medicines.  The following changes have been made:  no change  Labs/ tests ordered today include:   Orders Placed This Encounter  Procedures  . EXERCISE TOLERANCE TEST (ETT)  . EKG 12-Lead     Disposition:   FU with me in one year     Signed, Minus Breeding, MD  04/24/2018 5:27 PM    [facility]

## 2018-04-24 ENCOUNTER — Encounter: Payer: Self-pay | Admitting: Cardiology

## 2018-04-24 ENCOUNTER — Ambulatory Visit (INDEPENDENT_AMBULATORY_CARE_PROVIDER_SITE_OTHER): Payer: Medicare Other | Admitting: Cardiology

## 2018-04-24 ENCOUNTER — Encounter

## 2018-04-24 VITALS — BP 120/68 | HR 59 | Ht 66.0 in | Wt 195.0 lb

## 2018-04-24 DIAGNOSIS — R079 Chest pain, unspecified: Secondary | ICD-10-CM

## 2018-04-24 DIAGNOSIS — I10 Essential (primary) hypertension: Secondary | ICD-10-CM | POA: Diagnosis not present

## 2018-04-24 NOTE — Patient Instructions (Addendum)
Medication Instructions:  Continue current medications  If you need a refill on your cardiac medications before your next appointment, please call your pharmacy.  Labwork: None Ordered   Testing/Procedures: Your physician has requested that you have an exercise tolerance test. For further information please visit [email or web address]. Please also follow instruction sheet, as given.   Follow-Up: Your physician wants you to follow-up in: 1 Year. You should receive a reminder letter in the mail two months in advance. If you do not receive a letter, please call our office [phone].       Thank you for choosing [facility] at [facility]!!

## 2018-05-12 ENCOUNTER — Other Ambulatory Visit: Payer: Self-pay | Admitting: Endocrinology

## 2018-05-26 DIAGNOSIS — E669 Obesity, unspecified: Secondary | ICD-10-CM | POA: Diagnosis not present

## 2018-05-26 DIAGNOSIS — M2550 Pain in unspecified joint: Secondary | ICD-10-CM | POA: Diagnosis not present

## 2018-05-26 DIAGNOSIS — M0609 Rheumatoid arthritis without rheumatoid factor, multiple sites: Secondary | ICD-10-CM | POA: Diagnosis not present

## 2018-05-26 DIAGNOSIS — M7989 Other specified soft tissue disorders: Secondary | ICD-10-CM | POA: Diagnosis not present

## 2018-05-26 DIAGNOSIS — M1A09X0 Idiopathic chronic gout, multiple sites, without tophus (tophi): Secondary | ICD-10-CM | POA: Diagnosis not present

## 2018-05-26 DIAGNOSIS — Z6831 Body mass index (BMI) 31.0-31.9, adult: Secondary | ICD-10-CM | POA: Diagnosis not present

## 2018-06-15 ENCOUNTER — Other Ambulatory Visit: Payer: Medicare Other

## 2018-06-16 ENCOUNTER — Other Ambulatory Visit (INDEPENDENT_AMBULATORY_CARE_PROVIDER_SITE_OTHER): Payer: Medicare Other

## 2018-06-16 DIAGNOSIS — E1165 Type 2 diabetes mellitus with hyperglycemia: Secondary | ICD-10-CM | POA: Diagnosis not present

## 2018-06-16 DIAGNOSIS — Z794 Long term (current) use of insulin: Secondary | ICD-10-CM

## 2018-06-16 LAB — COMPREHENSIVE METABOLIC PANEL
ALT: 32 U/L (ref 0–53)
AST: 23 U/L (ref 0–37)
Albumin: 4.4 g/dL (ref 3.5–5.2)
Alkaline Phosphatase: 69 U/L (ref 39–117)
BUN: 23 mg/dL (ref 6–23)
CO2: 23 mEq/L (ref 19–32)
Calcium: 10.2 mg/dL (ref 8.4–10.5)
Chloride: 106 mEq/L (ref 96–112)
Creatinine, Ser: 1.54 mg/dL — ABNORMAL HIGH (ref 0.40–1.50)
GFR: 47.72 mL/min — ABNORMAL LOW (ref >60.00)
Glucose, Bld: 113 mg/dL — ABNORMAL HIGH (ref 70–99)
Potassium: 4.2 mEq/L (ref 3.5–5.1)
Sodium: 140 mEq/L (ref 135–145)
Total Bilirubin: 0.9 mg/dL (ref 0.2–1.2)
Total Protein: 7.6 g/dL (ref 6.0–8.3)

## 2018-06-16 LAB — HEMOGLOBIN A1C: Hgb A1c MFr Bld: 6.8 % — ABNORMAL HIGH (ref 4.6–6.5)

## 2018-06-16 LAB — MICROALBUMIN / CREATININE URINE RATIO
Creatinine,U: 104.4 mg/dL
Microalb Creat Ratio: 5.7 mg/g (ref 0.0–30.0)
Microalb, Ur: 5.9 mg/dL — ABNORMAL HIGH (ref 0.0–1.9)

## 2018-06-16 NOTE — Progress Notes (Signed)
Patient ID: Alec [name], male   DOB: October 22, 1945, 72 y.o.   MRN: [id number]             Reason for Appointment:  Follow-up for Type 2 Diabetes  History of Present Illness:          Diagnosis: Type 2 diabetes mellitus, date of diagnosis: 2001       Prior history: He was seen in consultation in 11/2014 when his A1c was 7.4 Since his blood sugars were averaging over 200 he was given Victoza in addition to his basal bolus insulin regimen in 6/16 He started on the V go pump in October 2016  Recent history:    INSULIN regimen is described as:  BASAL rate on Omnipod pump 2.2 over 24 hours Boluses 12 units lunch and dinner and 14.5 units before breakfast   Non-insulin hypoglycemic drugs the patient is taking are: Victoza 1.2 mg daily   His A1c in 9/19 is 6.8 compared to 7.5 previously  Current blood sugar patterns from analysis of his meter download, daily management and problems identified:  He has used his FreeStyle libre intermittently but does not have it on currently and did not bring his meter for download  He is using his Omnipod pump in his abdomen because of episode of cellulitis with using it on his arm  In the last week or so he has had much higher readings at the time because of being on vacation and eating differently  He does not adjust his boluses based on what he is eating as far as carbohydrates or meal size; yesterday with eating a [employer] sandwich his blood sugar went up to 179 before dinnertime  Otherwise blood sugars do not show any consistent pattern  Currently on the pump he does not have any blood sugars after his evening meal  He is concerned about the cost of Victoza and not using 1.2 mg daily without any significant change in his control or weight  He is still trying to walk at least every other day and probably somewhat more active at work  Side effects from medications have been:none  Glucose monitoring:   Freestyle       Blood Glucose readings   PRE-MEAL Fasting Lunch Dinner Bedtime Overall  Glucose range:  87-240  107-276  77-241    Mean/median:  148  178  132  153   POST-MEAL PC Breakfast PC Lunch PC Dinner  Glucose range:   ?  Mean/median:       Previous readings: Mean values apply above for all meters except median for One Touch  PRE-MEAL Fasting Lunch Dinner Bedtime Overall  Glucose range: 102-251  17-264  70-233  70-264  Mean/median:  151  127  132  144   POST-MEAL PC Breakfast PC Lunch PC Dinner  Glucose range:   241  Mean/median:        Self-care:  Meals: 3 meals per day. Lunch  1 pm Dinner 6 pm Breakfast is usually an egg sandwich but sometimes biscuits   Eating a balanced meals in the evening.  He will have snacks on granola bars              Exercise: walking qod   Dietician visit, most recent: 6/15.               Weight history:  Wt Readings from Last 3 Encounters:  06/17/18 196 lb 4 oz (89 kg)  04/24/18 195 lb (88.5 kg)  03/09/18 193  lb (87.5 kg)    Glycemic control:    Lab Results  Component Value Date   HGBA1C 6.8 (H) 06/16/2018   HGBA1C 7.5 (H) 03/04/2018   HGBA1C 7.5 (H) 10/23/2017   Lab Results  Component Value Date   MICROALBUR 5.9 (H) 06/16/2018   LDLCALC 54 03/28/2016   CREATININE 1.54 (H) 06/16/2018     Past history:  He was not symptomatic at time of diagnosis and was probably treated with Amaryl only.  No details are available of previous management However he thinks he was not given metformin at any time because of his "kidney problem" He had a A1c of over 9% in 2013 and may have been started on insulin at that time. Most likely has been taking Levemir and NovoLog since then A1c records show his results were over 8% in 2014 and in 4/15 but subsequently down to 7.1 in November 2015 Amaryl was stopped in 7/16  Other active problems are addressed in Review of systems   Allergies as of 06/17/2018   No Known Allergies       Medication List        Accurate as of 06/17/18  8:35 AM. Always use your most recent med list.          amLODipine 10 MG tablet Commonly known as:  NORVASC Take 10 mg by mouth daily.   aspirin 81 MG tablet Take 81 mg by mouth daily.   atorvastatin 20 MG tablet Commonly known as:  LIPITOR Take 20 mg by mouth daily.   buPROPion 150 MG 24 hr tablet Commonly known as:  WELLBUTRIN XL Take 150 mg by mouth daily.   fish oil-omega-3 fatty acids 1000 MG capsule Take 2 g by mouth daily.   glucose blood test strip USE AS INSTRUCTED TO TEST BLOOD SUGARS 3 TIMES DAILY. DX CODE-E11.65   insulin aspart 100 UNIT/ML injection Commonly known as:  novoLOG INJECT 85 UNITS SUBCUTANEOUSLY ONCE DAILY WITH  INSULIN  PUMP Dx Code E11.65   lisinopril-hydrochlorothiazide 20-12.5 MG tablet Commonly known as:  PRINZIDE,ZESTORETIC Take 1 tablet by mouth daily.   multivitamin tablet Take 1 tablet by mouth daily.   OMNIPOD 5 PACK Misc Place 1 each onto the skin as directed. Place 1 onto skin as directed every 48 hours   rOPINIRole 0.5 MG tablet Commonly known as:  REQUIP TAKE 1 TABLET (0.5 MG TOTAL) BY MOUTH AT BEDTIME.   sertraline 100 MG tablet Commonly known as:  ZOLOFT Take 150 mg by mouth daily.   VICTOZA 18 MG/3ML Sopn Generic drug:  liraglutide INJECT 1.8 MG SUBCUTANEOUSLY ONCE DAILY AT THE SAME TIME EVERYDAY   Vitamin D3 3000 units Tabs Take 1 tablet by mouth daily.       Allergies: No Known Allergies  Past Medical History:  Diagnosis Date  . Degenerative arthritis of shoulder region 08/2013   left  . Disorder of bursae and tendons in left shoulder region 08/2013  . Hyperlipidemia   . Hypertension    under control with meds., has been on med. > 10 yr.  . IDDM (insulin dependent diabetes mellitus) ([place])     Past Surgical History:  Procedure Laterality Date  . CHOLECYSTECTOMY  08/29/2012   Procedure: LAPAROSCOPIC CHOLECYSTECTOMY WITH INTRAOPERATIVE CHOLANGIOGRAM;   Surgeon: Imogene Burn. Georgette Dover, MD;  Location: [facility];  Service: General;  Laterality: N/A;  . SHOULDER ARTHROSCOPY WITH SUBACROMIAL DECOMPRESSION, ROTATOR CUFF REPAIR AND BICEP TENDON REPAIR Left 09/03/2013   Procedure: LEFT SHOULDER ARTHROSCOPY WITH EXTENSIVE DEBRIDMENT,  DISTAL CLAVICULECTOMY, ROTATOR CUFF REPAIR AND SUBACROMIAL DECOMPRESSION PARTIAL ACRIOMIOPLASTY WITH CORACOACROMIAL RELEASE;  Surgeon: Renette Butters, MD;  Location: [facility];  Service: Orthopedics;  Laterality: Left;  . TRANSURETHRAL RESECTION OF BLADDER TUMOR WITH MITOMYCIN-C  08/24/2008    Family History  Problem Relation Age of Onset  . Diabetes Mother   . Cancer Father   . Coronary artery disease Father        MI at age 38.   . Sudden death Son   . CAD Maternal Grandfather        MI in his 37's  . CAD Paternal Grandfather     Social History:  reports that he quit smoking about 20 years ago. He has never used smokeless tobacco. He reports that he does not drink alcohol or use drugs.    Review of Systems       HYPERTENSION: has been treated by his PCP with amlodipine 10 mg, and lisinopril HCT Blood pressure management also followed by nephrologist       Lipids: he is taking Lipitor 20 mg from his PCP, last triglycerides and LDL are normal in 7/19 LDL was 40 and triglycerides 146        Lab Results  Component Value Date   CHOL 108 03/28/2016   HDL 28.80 (L) 03/28/2016   LDLCALC 54 03/28/2016   TRIG 130.0 03/28/2016   CHOLHDL 4 03/28/2016               CKD: He has been followed by nephrologist for diabetic nephropathy.   Last urine microalbumin ratio is normal His creatinine is mildly increased as before  His creatinine is stable follows:  Lab Results  Component Value Date   CREATININE 1.54 (H) 06/16/2018   CREATININE 1.58 (H) 03/04/2018   CREATININE 1.50 (H) 12/11/2017     Last foot exam in 09/2017  Has neuropathic symptoms controlled with low-dose gabapentin, also on Requip  from PCP  Eye exam last:  1/19   Physical Examination:  BP 136/78 (BP Location: Left Arm, Patient Position: Sitting, Cuff Size: Normal)   Pulse 74   Resp 20   Ht 5\' 7"  (1.702 m)   Wt 196 lb 4 oz (89 kg)   BMI 30.74 kg/m        ASSESSMENT:  Diabetes type 2, insulin requiring  See history of present illness for detailed discussion of his current management, blood sugar patterns and problems identified  His A1c is improved at 6.8  His OmniPod insulin pump download was reviewed but he does not have his freestyle libre for review His blood sugars have been variable and highest readings are around lunchtime He thinks most of his high readings are because of not eating well on medication and also sometimes eating more carbohydrates in the morning He still requires relatively large amounts of insulin overall Discussed that his boluses may need to be adjusted at least 2 to 4 units based on his meal size and not just use the preset meal boluses Currently not comfortable counting carbohydrates also He does have somewhat higher fasting readings overall but not consistent in some mornings they may be quite normal including in the lab; need better idea of his blood sugar patterns overnight especially in relationship to his evening meals    HYPERTENSION: Well controlled CKD: Stable  PLAN:    He will increase the bolus amounts as directed by 2 to 4 units for larger meals  May need to try moderate his  food intake when eating out  Consider consultation with dietitian  If postprandial readings are higher he will be recommended 1.8 mg Victoza  Some exercise  To start using freestyle libre system and use skin Tac to keep it on, currently not preferring to use the arm band  To let us know if he has any consistent blood sugars at any given time  No change in basal rates at this time  Influenza vaccine given and patient information given to him also  Patient Instructions  More  sugars at nite  Change bolus amount for type of meal and Carbs    Counseling time on subjects discussed in assessment and plan sections is over 50% of today's 25 minute visit   Elayne Snare 06/17/2018, 8:35 AM   Note: This office note was prepared with Dragon voice recognition system technology. Any transcriptional errors that result from this process are unintentional.

## 2018-06-17 ENCOUNTER — Encounter: Payer: Self-pay | Admitting: Endocrinology

## 2018-06-17 ENCOUNTER — Ambulatory Visit (INDEPENDENT_AMBULATORY_CARE_PROVIDER_SITE_OTHER): Payer: Medicare Other | Admitting: Endocrinology

## 2018-06-17 VITALS — BP 136/78 | HR 74 | Resp 20 | Ht 67.0 in | Wt 196.2 lb

## 2018-06-17 DIAGNOSIS — N183 Chronic kidney disease, stage 3 unspecified: Secondary | ICD-10-CM

## 2018-06-17 DIAGNOSIS — Z794 Long term (current) use of insulin: Secondary | ICD-10-CM | POA: Diagnosis not present

## 2018-06-17 DIAGNOSIS — Z23 Encounter for immunization: Secondary | ICD-10-CM

## 2018-06-17 DIAGNOSIS — E1165 Type 2 diabetes mellitus with hyperglycemia: Secondary | ICD-10-CM | POA: Diagnosis not present

## 2018-06-17 NOTE — Patient Instructions (Addendum)
More sugars at nite  Change bolus amount for type of meal and Carbs

## 2018-06-24 ENCOUNTER — Telehealth: Payer: Self-pay | Admitting: Cardiology

## 2018-06-24 DIAGNOSIS — R079 Chest pain, unspecified: Secondary | ICD-10-CM

## 2018-06-24 NOTE — Telephone Encounter (Signed)
Patient saw Dr Percival Spanish 04/24/18 and was calling to confirm if he is needing a stress test scheduled  Clarification needed on type of stress test

## 2018-06-24 NOTE — Telephone Encounter (Signed)
Returned call to patient he stated he was ready to schedule stress test.Stated he had episode of chest pain today while mowing lawn.No chest pain at present.After reviewing chart Dr.Hochrein ordered GXT.Advised scheduler will call back to schedule.Advised to go to ED if needed.

## 2018-07-02 ENCOUNTER — Telehealth (HOSPITAL_COMMUNITY): Payer: Self-pay

## 2018-07-02 NOTE — Telephone Encounter (Signed)
Encounter complete. 

## 2018-07-07 ENCOUNTER — Encounter (HOSPITAL_COMMUNITY): Payer: Self-pay | Admitting: *Deleted

## 2018-07-07 ENCOUNTER — Ambulatory Visit (HOSPITAL_COMMUNITY)
Admission: RE | Admit: 2018-07-07 | Discharge: 2018-07-07 | Disposition: A | Discharge status: Home / Self Care | Payer: Medicare Other | Source: Ambulatory Visit | Attending: Cardiovascular Disease | Admitting: Cardiovascular Disease

## 2018-07-07 DIAGNOSIS — C678 Malignant neoplasm of overlapping sites of bladder: Secondary | ICD-10-CM | POA: Diagnosis not present

## 2018-07-07 DIAGNOSIS — C61 Malignant neoplasm of prostate: Secondary | ICD-10-CM | POA: Diagnosis not present

## 2018-07-07 DIAGNOSIS — N5201 Erectile dysfunction due to arterial insufficiency: Secondary | ICD-10-CM | POA: Diagnosis not present

## 2018-07-07 DIAGNOSIS — R079 Chest pain, unspecified: Secondary | ICD-10-CM | POA: Diagnosis not present

## 2018-07-07 LAB — EXERCISE TOLERANCE TEST
Estimated workload: 9.7 METS
Exercise duration (min): 7 min
Exercise duration (sec): 46 s
MPHR: 151 {beats}/min
Peak HR: 123 {beats}/min
Percent HR: 81 %
RPE: 19
Rest HR: 65 {beats}/min

## 2018-07-07 NOTE — Progress Notes (Unsigned)
Abnormal  ETT was reviewed by Dr. Gwenlyn Found. Patient was advised to schedule a follow-up appointment to see Dr. Percival Spanish in two weeks. Patient was discharged to go home.

## 2018-07-10 ENCOUNTER — Other Ambulatory Visit: Payer: Self-pay | Admitting: Endocrinology

## 2018-07-10 ENCOUNTER — Telehealth: Payer: Self-pay | Admitting: *Deleted

## 2018-07-10 DIAGNOSIS — R9439 Abnormal result of other cardiovascular function study: Secondary | ICD-10-CM

## 2018-07-10 DIAGNOSIS — R5383 Other fatigue: Secondary | ICD-10-CM

## 2018-07-10 DIAGNOSIS — Z01812 Encounter for preprocedural laboratory examination: Secondary | ICD-10-CM

## 2018-07-10 DIAGNOSIS — Z79899 Other long term (current) drug therapy: Secondary | ICD-10-CM

## 2018-07-10 NOTE — Telephone Encounter (Addendum)
 @  [facility] [place] [address] Dept: [phone] Loc: [phone]  NASIM HABEEB  07/10/2018  You are scheduled for a Cardiac Catheterization on Wednesday, October 16 with Dr. Glenetta Hew.  1. Please arrive at the [facility] (Main Entrance A) at [facility]: [address] [place], [place] [address] at 6:30 AM (This time is two hours before your procedure to ensure your preparation). Free valet parking service is available.   Special note: Every effort is made to have your procedure done on time. Please understand that emergencies sometimes delay scheduled procedures.  2. Diet: Do not eat solid foods after midnight.  The patient may have clear liquids until 5am upon the day of the procedure.  3. Labs: You will need to have blood drawn on Monday, October 14 at [place]  Open: 8am - 5pm (Lunch 12:30 - 1:30)   Phone: [phone]. You do not need to be fasting.  4. Medication instructions in preparation for your procedure:   Contrast Allergy: No   Don't take Novolog and Victoza day of procedure  On the morning of your procedure, take your Aspirin and any morning medicines NOT listed above.  You may use sips of water.  5. Plan for one night stay--bring personal belongings. 6. Bring a current list of your medications and current insurance cards. 7. You MUST have a responsible person to drive you home. 8. Someone MUST be with you the first 24 hours after you arrive home or your discharge will be delayed. 9. Please wear clothes that are easy to get on and off and wear slip-on shoes.  Thank you for allowing [place] to care for you!   -- [facility] [facility]

## 2018-07-10 NOTE — Telephone Encounter (Signed)
-----   Message from Minus Breeding, MD sent at 07/09/2018  1:25 PM EDT ----- I called and spoke with the patient.  I reviewed the POET (Plain Old Exercise Treadmill).  Cardiac cath is indicated.  The patient understands that risks included but are not limited to stroke (1 in 1000), death (1 in 54), kidney failure [usually temporary] (1 in 500), bleeding (1 in 200), allergic reaction [possibly serious] (1 in 200).  The patient understands and agrees to proceed. .  Please call to arrange and send results to Mayra Neer, MD

## 2018-07-14 ENCOUNTER — Encounter: Payer: Self-pay | Admitting: Adult Health

## 2018-07-14 ENCOUNTER — Encounter: Payer: Self-pay | Admitting: *Deleted

## 2018-07-14 ENCOUNTER — Telehealth: Payer: Self-pay

## 2018-07-14 ENCOUNTER — Ambulatory Visit (INDEPENDENT_AMBULATORY_CARE_PROVIDER_SITE_OTHER): Payer: Medicare Other | Admitting: Adult Health

## 2018-07-14 VITALS — BP 148/72 | HR 70 | Ht 67.0 in | Wt 196.0 lb

## 2018-07-14 DIAGNOSIS — E7800 Pure hypercholesterolemia, unspecified: Secondary | ICD-10-CM

## 2018-07-14 DIAGNOSIS — I10 Essential (primary) hypertension: Secondary | ICD-10-CM

## 2018-07-14 DIAGNOSIS — Z794 Long term (current) use of insulin: Secondary | ICD-10-CM

## 2018-07-14 DIAGNOSIS — R9439 Abnormal result of other cardiovascular function study: Secondary | ICD-10-CM

## 2018-07-14 DIAGNOSIS — I209 Angina pectoris, unspecified: Secondary | ICD-10-CM | POA: Clinically undetermined

## 2018-07-14 DIAGNOSIS — Z01812 Encounter for preprocedural laboratory examination: Secondary | ICD-10-CM | POA: Diagnosis not present

## 2018-07-14 DIAGNOSIS — R5383 Other fatigue: Secondary | ICD-10-CM | POA: Diagnosis not present

## 2018-07-14 DIAGNOSIS — E119 Type 2 diabetes mellitus without complications: Secondary | ICD-10-CM | POA: Diagnosis not present

## 2018-07-14 DIAGNOSIS — IMO0001 Reserved for inherently not codable concepts without codable children: Secondary | ICD-10-CM

## 2018-07-14 DIAGNOSIS — Z79899 Other long term (current) drug therapy: Secondary | ICD-10-CM | POA: Diagnosis not present

## 2018-07-14 DIAGNOSIS — Z01810 Encounter for preprocedural cardiovascular examination: Secondary | ICD-10-CM

## 2018-07-14 NOTE — Telephone Encounter (Signed)
Per cath lab-Pt needs to be seen today for pre cath work up.  Ordering physician's nurse aware.

## 2018-07-14 NOTE — Progress Notes (Signed)
Cardiology Office Note   Date:  07/14/2018   ID:  Alec [name], DOB 07-20-1946, MRN [id number]  PCP:  Mayra Neer, MD  Cardiologist:  [facility]   Chief Complaint  Patient presents with  . Follow-up    Abnormal NM Stress test  . Chest Pain     History of Present Illness: Alec [name] is a 72 y.o. male who presents for follow up for pre-cardiac cath evaluation. 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 Due to CVRF of DM, Age, Gender, HTN, and HL, he was schedule for POET. Stress test was completed on 07/07/2018 and found to be abnormal with ST depression in II, III, an aVF leads. There was also T-wave inversion during stress test. These changes evolved into T-wave inversion during the recovery phase.   He is planned for cardiac cath on 07/15/2018 and is here to discuss test results, and provide explanation of cath along with physical assessment as he has not been seen since July. He continues to have mild chest pressure with exertion but has been careful not to over do activity.   Past Medical History:  Diagnosis Date  . Degenerative arthritis of shoulder region 08/2013   left  . Disorder of bursae and tendons in left shoulder region 08/2013  . Hyperlipidemia   . Hypertension    under control with meds., has been on med. > 10 yr.  . IDDM (insulin dependent diabetes mellitus) ([place])     Past Surgical History:  Procedure Laterality Date  . CHOLECYSTECTOMY  08/29/2012   Procedure: LAPAROSCOPIC CHOLECYSTECTOMY WITH INTRAOPERATIVE CHOLANGIOGRAM;  Surgeon: Imogene Burn. Georgette Dover, MD;  Location: [facility];  Service: General;  Laterality: N/A;  . SHOULDER ARTHROSCOPY WITH SUBACROMIAL DECOMPRESSION, ROTATOR CUFF REPAIR AND BICEP TENDON REPAIR Left 09/03/2013   Procedure: LEFT SHOULDER ARTHROSCOPY WITH EXTENSIVE DEBRIDMENT, DISTAL CLAVICULECTOMY, ROTATOR CUFF  REPAIR AND SUBACROMIAL DECOMPRESSION PARTIAL ACRIOMIOPLASTY WITH CORACOACROMIAL RELEASE;  Surgeon: Renette Butters, MD;  Location: [facility] [facility];  Service: Orthopedics;  Laterality: Left;  . TRANSURETHRAL RESECTION OF BLADDER TUMOR WITH MITOMYCIN-C  08/24/2008     Current Outpatient Medications  Medication Sig Dispense Refill  . allopurinol (ZYLOPRIM) 100 MG tablet Take 200 mg by mouth daily after supper.    Marland Kitchen amLODipine (NORVASC) 10 MG tablet Take 10 mg by mouth daily after supper.     Marland Kitchen aspirin 81 MG tablet Take 81 mg by mouth daily after supper.     Marland Kitchen atorvastatin (LIPITOR) 20 MG tablet Take 20 mg by mouth daily after supper.     Marland Kitchen buPROPion (WELLBUTRIN XL) 150 MG 24 hr tablet Take 150 mg by mouth daily.    . Cholecalciferol (VITAMIN D) 2000 units tablet Take 2,000 Units by mouth daily after supper.    . folic acid (FOLVITE) 1 MG tablet Take 1 mg by mouth daily after supper.    . Glucosamine-Chondroitin (OSTEO BI-FLEX REGULAR STRENGTH PO) Take 1 tablet by mouth daily after supper.    Marland Kitchen glucose blood (FREESTYLE LITE) test strip USE AS INSTRUCTED TO TEST BLOOD SUGARS 3 TIMES DAILY. DX CODE-E11.65 100 each 3  . insulin aspart (NOVOLOG) 100 UNIT/ML injection INJECT 85 UNITS SUBCUTANEOUSLY ONCE DAILY WITH  INSULIN  PUMP Dx Code E11.65 70 mL 3  . Insulin Disposable Pump (OMNIPOD 5 PACK) MISC Place 1 each onto the skin as directed.  Place 1 onto skin as directed every 48 hours 15 each 3  . lisinopril-hydrochlorothiazide (PRINZIDE,ZESTORETIC) 20-12.5 MG tablet Take 2 tablets by mouth daily after supper.     . methotrexate (RHEUMATREX) 2.5 MG tablet Take 15 mg by mouth every Thursday. Caution:Chemotherapy. Protect from light.    . Multiple Vitamins-Minerals (ADVANCED DIABETIC MULTIVITAMIN) TABS Take 2 tablets by mouth daily after supper.    Marland Kitchen OVER THE COUNTER MEDICATION Take 1 tablet by mouth 2 (two) times daily. cognium otc supplement    . rOPINIRole (REQUIP) 0.5 MG tablet TAKE 1 TABLET  (0.5 MG TOTAL) BY MOUTH AT BEDTIME. 30 tablet 3  . VICTOZA 18 MG/3ML SOPN INJECT 1.8 MG SUBCUTANEOUSLY ONCE DAILY AT THE SAME TIME EVERYDAY (Patient taking differently: Inject 1.2 mg into the skin daily. ) 27 mL 1   No current facility-administered medications for this visit.     Allergies:   Patient has no known allergies.    Social History:  The patient  reports that he quit smoking about 20 years ago. He has never used smokeless tobacco. He reports that he does not drink alcohol or use drugs.   Family History:  The patient's family history includes CAD in his maternal grandfather and paternal grandfather; Cancer in his father; Coronary artery disease in his father; Diabetes in his mother; Sudden death in his son.    ROS: All other systems are reviewed and negative. Unless otherwise mentioned in H&P    PHYSICAL EXAM: VS:  BP (!) 148/72   Pulse 70   Ht 5\' 7"  (1.702 m)   Wt 196 lb (88.9 kg)   BMI 30.70 kg/m  , BMI Body mass index is 30.7 kg/m. GEN: Well nourished, well developed, in no acute distress HEENT: normal Neck: no JVD, soft right carotid bruit, non on the left, or masses Cardiac: RRR; no murmurs, rubs, or gallops,no edema  Respiratory:  Clear to auscultation bilaterally, normal work of breathing GI: soft, nontender, nondistended, + BS MS: no deformity or atrophy Skin: warm and dry, no rash Neuro:  Strength and sensation are intact Psych: euthymic mood, full affect   EKG: NSR rate of 70 bpm.  Recent Labs: 12/10/2017: Hemoglobin 12.8; Platelets 121 06/16/2018: ALT 32; BUN 23; Creatinine, Ser 1.54; Potassium 4.2; Sodium 140    Lipid Panel    Component Value Date/Time   CHOL 108 03/28/2016 0803   TRIG 130.0 03/28/2016 0803   HDL 28.80 (L) 03/28/2016 0803   CHOLHDL 4 03/28/2016 0803   VLDL 26.0 03/28/2016 0803   LDLCALC 54 03/28/2016 0803      Wt Readings from Last 3 Encounters:  07/14/18 196 lb (88.9 kg)  06/17/18 196 lb 4 oz (89 kg)  04/24/18 195 lb  (88.5 kg)      Other studies Reviewed: Study Highlights GXT  07/07/2018    ST segment depression was noted during stress in the II, III and aVF leads, and returning to baseline after 5-9 minutes of recovery.  T wave inversion was noted during stress, and returning to baseline after 5-9 mins of recovery.  The patient walked for 7:46 of a Bruce protocol GXT. Peak HR of 123 which is 81% predicted maxinal HR. the treadmill was stopped because of angina / chest pai  The patient developed 1-2 mm ST depression in leads II, III, aVF. These ST changes evolved into TWI during the recovery phase  BP response was normal  This is a abnormal treadmill test - c/w ischemia .  ASSESSMENT AND PLAN:  1.  Chest pain: Abnormal GXT revealing inferior ST depression with TWI in recovery. He is planned for cardiac cath on Jul 15, 2018 with Dr. Ellyn Hack. I have explained the procedure. The patient understands that risks include but are not limited to stroke (1 in 1000), death (1 in 63), kidney failure [usually temporary] (1 in 500), bleeding (1 in 200), allergic reaction [possibly serious] (1 in 200), and agrees to proceed.   2. Insulin Dependent Diabetes: PATIENT HAS INSULIN PUMP. He has held Victoza for am.   3. Hypertension: He will hold lisinopril/HCTZ tonight but take amlodipine as directed. In the am, he will take ASA as directed.   4. Hypercholesterolemia: Continue statin therapy, with atorvastatin 20 mg daily. Will likely need increase on dose if he does indeed have CAD.    Current medicines are reviewed at length with the patient today.    Labs/ tests ordered today include: None  Phill Myron. [place], ANP, AACC   07/14/2018 4:26 PM    [place] [address] Office [phone] Fax [phone]

## 2018-07-14 NOTE — Telephone Encounter (Signed)
Left detailed message on Pt VM.  Pt did not get repeat labs as instructed.  Pt last GFR was 47.  Requested Pt get lab work today or he would have labs drawn in the morning and his cath may be delayed.  Notified Pt to hold lisinopril-HCTZ tonight and hydrate well.  Notified cath lab.  Left this nurse name and # to call back if any questions.

## 2018-07-14 NOTE — Telephone Encounter (Signed)
Pt is being seen by Jory Sims, DNP today.

## 2018-07-14 NOTE — Patient Instructions (Addendum)
Letter by Vennie Homans on 07/14/2018    [place], [place]  [address] Phone:  [phone]   Fax:  [phone] July 14, 2018  Patient: Alec [name]  MRN: [id number]  Date of Birth: Jul 01, 1946  Date of Visit: 07/14/2018    Dear [name] :   @[facility] @ [place] [address] Dept: [phone] Loc: [place] R P[place]10/07/2018  You are scheduled for aCardiac Catheterizationon [id number] Dr. Glenetta Hew.  1. Please arrive at the [facility] (Main Entrance A) at [facility]: [address] [place], [place] [address] at6:30 AM(This time is two hours before your procedure to ensure your preparation). Free valet parking service is available.   Special note: Every effort is made to have your procedure done on time. Please understand that emergencies sometimes delay scheduled procedures.  2. Diet: Do not eat solid foods after midnight. The patient may have clear liquids until 5am upon the day of the procedure.  3. Labs: You will need to have blood drawn onMonday,October14at [place] [address], [place]  Open: 8am - 5pm (Lunch 12:30 - 1:30) Phone: [phone]. You do not need to be fasting.  4. Medication instructions in preparation for your procedure:  Contrast Allergy:No  Hold lisinopril-HCTZ tonight and hydrate well.  Don't take Novolog and Victoza day of procedure  On the morning of your procedure, take yourAspirinand any morning medicines NOT listed above. You may use sips of water.  5. Plan for one night stay--bring personal belongings. 6. Bring a current list of your medications and current insurance cards. 7. You MUST have a responsible person to drive you home. 8. Someone MUST be with you the  first 24 hours after you arrive home or your discharge will be delayed. 9. Please wear clothes that are easy to get on and off and wear slip-on shoes.  Thank you for allowing [place] to care for you! -- [place] [facility]  Sincerely,  Vennie Homans

## 2018-07-14 NOTE — H&P (View-Only) (Signed)
Cardiology Office Note   Date:  07/14/2018   ID:  Alec [name], DOB 1946/08/24, MRN [id number]  PCP:  Mayra Neer, MD  Cardiologist:  [facility]   Chief Complaint  Patient presents with  . Follow-up    Abnormal NM Stress test  . Chest Pain     History of Present Illness: Alec [name] is a 72 y.o. male who presents for follow up for pre-cardiac cath evaluation. 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 Due to CVRF of DM, Age, Gender, HTN, and HL, he was schedule for POET. Stress test was completed on 07/07/2018 and found to be abnormal with ST depression in II, III, an aVF leads. There was also T-wave inversion during stress test. These changes evolved into T-wave inversion during the recovery phase.   He is planned for cardiac cath on 07/15/2018 and is here to discuss test results, and provide explanation of cath along with physical assessment as he has not been seen since July. He continues to have mild chest pressure with exertion but has been careful not to over do activity.   Past Medical History:  Diagnosis Date  . Degenerative arthritis of shoulder region 08/2013   left  . Disorder of bursae and tendons in left shoulder region 08/2013  . Hyperlipidemia   . Hypertension    under control with meds., has been on med. > 10 yr.  . IDDM (insulin dependent diabetes mellitus) ([place])     Past Surgical History:  Procedure Laterality Date  . CHOLECYSTECTOMY  08/29/2012   Procedure: LAPAROSCOPIC CHOLECYSTECTOMY WITH INTRAOPERATIVE CHOLANGIOGRAM;  Surgeon: Imogene Burn. Georgette Dover, MD;  Location: [facility];  Service: General;  Laterality: N/A;  . SHOULDER ARTHROSCOPY WITH SUBACROMIAL DECOMPRESSION, ROTATOR CUFF REPAIR AND BICEP TENDON REPAIR Left 09/03/2013   Procedure: LEFT SHOULDER ARTHROSCOPY WITH EXTENSIVE DEBRIDMENT, DISTAL CLAVICULECTOMY, ROTATOR CUFF  REPAIR AND SUBACROMIAL DECOMPRESSION PARTIAL ACRIOMIOPLASTY WITH CORACOACROMIAL RELEASE;  Surgeon: Renette Butters, MD;  Location: [facility];  Service: Orthopedics;  Laterality: Left;  . TRANSURETHRAL RESECTION OF BLADDER TUMOR WITH MITOMYCIN-C  08/24/2008     Current Outpatient Medications  Medication Sig Dispense Refill  . allopurinol (ZYLOPRIM) 100 MG tablet Take 200 mg by mouth daily after supper.    Marland Kitchen amLODipine (NORVASC) 10 MG tablet Take 10 mg by mouth daily after supper.     Marland Kitchen aspirin 81 MG tablet Take 81 mg by mouth daily after supper.     Marland Kitchen atorvastatin (LIPITOR) 20 MG tablet Take 20 mg by mouth daily after supper.     Marland Kitchen buPROPion (WELLBUTRIN XL) 150 MG 24 hr tablet Take 150 mg by mouth daily.    . Cholecalciferol (VITAMIN D) 2000 units tablet Take 2,000 Units by mouth daily after supper.    . folic acid (FOLVITE) 1 MG tablet Take 1 mg by mouth daily after supper.    . Glucosamine-Chondroitin (OSTEO BI-FLEX REGULAR STRENGTH PO) Take 1 tablet by mouth daily after supper.    Marland Kitchen glucose blood (FREESTYLE LITE) test strip USE AS INSTRUCTED TO TEST BLOOD SUGARS 3 TIMES DAILY. DX CODE-E11.65 100 each 3  . insulin aspart (NOVOLOG) 100 UNIT/ML injection INJECT 85 UNITS SUBCUTANEOUSLY ONCE DAILY WITH  INSULIN  PUMP Dx Code E11.65 70 mL 3  . Insulin Disposable Pump (OMNIPOD 5 PACK) MISC Place 1 each onto the skin as directed.  Place 1 onto skin as directed every 48 hours 15 each 3  . lisinopril-hydrochlorothiazide (PRINZIDE,ZESTORETIC) 20-12.5 MG tablet Take 2 tablets by mouth daily after supper.     . methotrexate (RHEUMATREX) 2.5 MG tablet Take 15 mg by mouth every Thursday. Caution:Chemotherapy. Protect from light.    . Multiple Vitamins-Minerals (ADVANCED DIABETIC MULTIVITAMIN) TABS Take 2 tablets by mouth daily after supper.    Marland Kitchen OVER THE COUNTER MEDICATION Take 1 tablet by mouth 2 (two) times daily. cognium otc supplement    . rOPINIRole (REQUIP) 0.5 MG tablet TAKE 1 TABLET  (0.5 MG TOTAL) BY MOUTH AT BEDTIME. 30 tablet 3  . VICTOZA 18 MG/3ML SOPN INJECT 1.8 MG SUBCUTANEOUSLY ONCE DAILY AT THE SAME TIME EVERYDAY (Patient taking differently: Inject 1.2 mg into the skin daily. ) 27 mL 1   No current facility-administered medications for this visit.     Allergies:   Patient has no known allergies.    Social History:  The patient  reports that he quit smoking about 20 years ago. He has never used smokeless tobacco. He reports that he does not drink alcohol or use drugs.   Family History:  The patient's family history includes CAD in his maternal grandfather and paternal grandfather; Cancer in his father; Coronary artery disease in his father; Diabetes in his mother; Sudden death in his son.    ROS: All other systems are reviewed and negative. Unless otherwise mentioned in H&P    PHYSICAL EXAM: VS:  BP (!) 148/72   Pulse 70   Ht 5\' 7"  (1.702 m)   Wt 196 lb (88.9 kg)   BMI 30.70 kg/m  , BMI Body mass index is 30.7 kg/m. GEN: Well nourished, well developed, in no acute distress HEENT: normal Neck: no JVD, soft right carotid bruit, non on the left, or masses Cardiac: RRR; no murmurs, rubs, or gallops,no edema  Respiratory:  Clear to auscultation bilaterally, normal work of breathing GI: soft, nontender, nondistended, + BS MS: no deformity or atrophy Skin: warm and dry, no rash Neuro:  Strength and sensation are intact Psych: euthymic mood, full affect   EKG: NSR rate of 70 bpm.  Recent Labs: 12/10/2017: Hemoglobin 12.8; Platelets 121 06/16/2018: ALT 32; BUN 23; Creatinine, Ser 1.54; Potassium 4.2; Sodium 140    Lipid Panel    Component Value Date/Time   CHOL 108 03/28/2016 0803   TRIG 130.0 03/28/2016 0803   HDL 28.80 (L) 03/28/2016 0803   CHOLHDL 4 03/28/2016 0803   VLDL 26.0 03/28/2016 0803   LDLCALC 54 03/28/2016 0803      Wt Readings from Last 3 Encounters:  07/14/18 196 lb (88.9 kg)  06/17/18 196 lb 4 oz (89 kg)  04/24/18 195 lb  (88.5 kg)      Other studies Reviewed: Study Highlights GXT  07/07/2018    ST segment depression was noted during stress in the II, III and aVF leads, and returning to baseline after 5-9 minutes of recovery.  T wave inversion was noted during stress, and returning to baseline after 5-9 mins of recovery.  The patient walked for 7:46 of a [place] protocol GXT. Peak HR of 123 which is 81% predicted maxinal HR. the treadmill was stopped because of angina / chest pai  The patient developed 1-2 mm ST depression in leads II, III, aVF. These ST changes evolved into TWI during the recovery phase  BP response was normal  This is a abnormal treadmill test - c/w ischemia .  ASSESSMENT AND PLAN:  1.  Chest pain: Abnormal GXT revealing inferior ST depression with TWI in recovery. He is planned for cardiac cath on Jul 15, 2018 with Dr. Ellyn Hack. I have explained the procedure. The patient understands that risks include but are not limited to stroke (1 in 1000), death (1 in 22), kidney failure [usually temporary] (1 in 500), bleeding (1 in 200), allergic reaction [possibly serious] (1 in 200), and agrees to proceed.   2. Insulin Dependent Diabetes: PATIENT HAS INSULIN PUMP. He has held Victoza for am.   3. Hypertension: He will hold lisinopril/HCTZ tonight but take amlodipine as directed. In the am, he will take ASA as directed.   4. Hypercholesterolemia: Continue statin therapy, with atorvastatin 20 mg daily. Will likely need increase on dose if he does indeed have CAD.    Current medicines are reviewed at length with the patient today.    Labs/ tests ordered today include: None  Phill Myron. [place] Niederer, ANP, AACC   07/14/2018 4:26 PM    [address] Office [phone] Fax [phone]

## 2018-07-15 ENCOUNTER — Ambulatory Visit (HOSPITAL_COMMUNITY): Payer: Medicare Other

## 2018-07-15 ENCOUNTER — Encounter (HOSPITAL_COMMUNITY)
Admission: RE | Disposition: A | Discharge status: Home / Self Care | Payer: Self-pay | Source: Ambulatory Visit | Attending: Cardiology

## 2018-07-15 ENCOUNTER — Other Ambulatory Visit: Payer: Self-pay

## 2018-07-15 ENCOUNTER — Other Ambulatory Visit: Payer: Self-pay | Admitting: *Deleted

## 2018-07-15 ENCOUNTER — Encounter (HOSPITAL_COMMUNITY): Payer: Self-pay | Admitting: General Practice

## 2018-07-15 ENCOUNTER — Ambulatory Visit (HOSPITAL_COMMUNITY)
Admission: RE | Admit: 2018-07-15 | Discharge: 2018-07-16 | Disposition: A | Discharge status: Home / Self Care | Payer: Medicare Other | Source: Ambulatory Visit | Attending: Cardiology | Admitting: Cardiology

## 2018-07-15 DIAGNOSIS — I209 Angina pectoris, unspecified: Secondary | ICD-10-CM | POA: Clinically undetermined

## 2018-07-15 DIAGNOSIS — I129 Hypertensive chronic kidney disease with stage 1 through stage 4 chronic kidney disease, or unspecified chronic kidney disease: Secondary | ICD-10-CM | POA: Insufficient documentation

## 2018-07-15 DIAGNOSIS — Z9641 Presence of insulin pump (external) (internal): Secondary | ICD-10-CM | POA: Insufficient documentation

## 2018-07-15 DIAGNOSIS — N183 Chronic kidney disease, stage 3 (moderate): Secondary | ICD-10-CM | POA: Diagnosis not present

## 2018-07-15 DIAGNOSIS — I25119 Atherosclerotic heart disease of native coronary artery with unspecified angina pectoris: Secondary | ICD-10-CM | POA: Diagnosis not present

## 2018-07-15 DIAGNOSIS — I2584 Coronary atherosclerosis due to calcified coronary lesion: Secondary | ICD-10-CM | POA: Insufficient documentation

## 2018-07-15 DIAGNOSIS — E785 Hyperlipidemia, unspecified: Secondary | ICD-10-CM | POA: Diagnosis not present

## 2018-07-15 DIAGNOSIS — R9439 Abnormal result of other cardiovascular function study: Secondary | ICD-10-CM | POA: Diagnosis not present

## 2018-07-15 DIAGNOSIS — Z7982 Long term (current) use of aspirin: Secondary | ICD-10-CM | POA: Diagnosis not present

## 2018-07-15 DIAGNOSIS — I420 Dilated cardiomyopathy: Secondary | ICD-10-CM | POA: Insufficient documentation

## 2018-07-15 DIAGNOSIS — Z794 Long term (current) use of insulin: Secondary | ICD-10-CM | POA: Diagnosis not present

## 2018-07-15 DIAGNOSIS — Z9049 Acquired absence of other specified parts of digestive tract: Secondary | ICD-10-CM | POA: Insufficient documentation

## 2018-07-15 DIAGNOSIS — Z79899 Other long term (current) drug therapy: Secondary | ICD-10-CM | POA: Diagnosis not present

## 2018-07-15 DIAGNOSIS — Z951 Presence of aortocoronary bypass graft: Secondary | ICD-10-CM

## 2018-07-15 DIAGNOSIS — Z9889 Other specified postprocedural states: Secondary | ICD-10-CM | POA: Insufficient documentation

## 2018-07-15 DIAGNOSIS — I2510 Atherosclerotic heart disease of native coronary artery without angina pectoris: Secondary | ICD-10-CM

## 2018-07-15 DIAGNOSIS — I348 Other nonrheumatic mitral valve disorders: Secondary | ICD-10-CM | POA: Diagnosis not present

## 2018-07-15 DIAGNOSIS — Z8249 Family history of ischemic heart disease and other diseases of the circulatory system: Secondary | ICD-10-CM | POA: Diagnosis not present

## 2018-07-15 DIAGNOSIS — E1121 Type 2 diabetes mellitus with diabetic nephropathy: Secondary | ICD-10-CM | POA: Diagnosis present

## 2018-07-15 DIAGNOSIS — E1122 Type 2 diabetes mellitus with diabetic chronic kidney disease: Secondary | ICD-10-CM | POA: Diagnosis not present

## 2018-07-15 DIAGNOSIS — Z87891 Personal history of nicotine dependence: Secondary | ICD-10-CM | POA: Diagnosis not present

## 2018-07-15 DIAGNOSIS — Z01811 Encounter for preprocedural respiratory examination: Secondary | ICD-10-CM

## 2018-07-15 DIAGNOSIS — I25110 Atherosclerotic heart disease of native coronary artery with unstable angina pectoris: Secondary | ICD-10-CM | POA: Diagnosis not present

## 2018-07-15 DIAGNOSIS — Z833 Family history of diabetes mellitus: Secondary | ICD-10-CM | POA: Insufficient documentation

## 2018-07-15 DIAGNOSIS — I10 Essential (primary) hypertension: Secondary | ICD-10-CM | POA: Diagnosis present

## 2018-07-15 HISTORY — PX: CARDIAC CATHETERIZATION: SHX172

## 2018-07-15 HISTORY — DX: Chronic kidney disease, stage 3 unspecified: N18.30

## 2018-07-15 HISTORY — DX: Chronic kidney disease, stage 3 (moderate): N18.3

## 2018-07-15 HISTORY — DX: Personal history of urinary calculi: Z87.442

## 2018-07-15 HISTORY — DX: Malignant neoplasm of bladder, unspecified: C67.9

## 2018-07-15 HISTORY — PX: LEFT HEART CATH AND CORONARY ANGIOGRAPHY: CATH118249

## 2018-07-15 HISTORY — DX: Presence of aortocoronary bypass graft: Z95.1

## 2018-07-15 HISTORY — DX: Unspecified osteoarthritis, unspecified site: M19.90

## 2018-07-15 HISTORY — DX: Gout, unspecified: M10.9

## 2018-07-15 LAB — CBC
HCT: 34.7 % — ABNORMAL LOW (ref 39.0–52.0)
Hematocrit: 39.1 % (ref 37.5–51.0)
Hemoglobin: 13.9 g/dL (ref 13.0–17.7)
Hemoglobin: 12.1 g/dL — ABNORMAL LOW (ref 13.0–17.0)
MCH: 33.9 pg — ABNORMAL HIGH (ref 26.6–33.0)
MCH: 32.6 pg (ref 26.0–34.0)
MCHC: 35.5 g/dL (ref 31.5–35.7)
MCHC: 34.9 g/dL (ref 30.0–36.0)
MCV: 95 fL (ref 79–97)
MCV: 93.5 fL (ref 80.0–100.0)
Platelets: 180 10*3/uL (ref 150–450)
Platelets: 152 10*3/uL (ref 150–400)
RBC: 4.1 x10E6/uL — ABNORMAL LOW (ref 4.14–5.80)
RBC: 3.71 MIL/uL — ABNORMAL LOW (ref 4.22–5.81)
RDW: 13.3 % (ref 12.3–15.4)
RDW: 13 % (ref 11.5–15.5)
WBC: 5.4 10*3/uL (ref 3.4–10.8)
WBC: 4.5 10*3/uL (ref 4.0–10.5)
nRBC: 0 % (ref 0.0–0.2)

## 2018-07-15 LAB — BASIC METABOLIC PANEL
Anion gap: 11 (ref 5–15)
BUN/Creatinine Ratio: 13 (ref 10–24)
BUN: 21 mg/dL (ref 8–27)
BUN: 18 mg/dL (ref 8–23)
CO2: 23 mmol/L (ref 20–29)
CO2: 23 mmol/L (ref 22–32)
Calcium: 9.9 mg/dL (ref 8.6–10.2)
Calcium: 8.8 mg/dL — ABNORMAL LOW (ref 8.9–10.3)
Chloride: 101 mmol/L (ref 96–106)
Chloride: 104 mmol/L (ref 98–111)
Creatinine, Ser: 1.68 mg/dL — ABNORMAL HIGH (ref 0.76–1.27)
Creatinine, Ser: 1.52 mg/dL — ABNORMAL HIGH (ref 0.61–1.24)
GFR calc Af Amer: 47 mL/min/{1.73_m2} — ABNORMAL LOW (ref >59)
GFR calc Af Amer: 52 mL/min — ABNORMAL LOW (ref >60)
GFR calc non Af Amer: 41 mL/min/{1.73_m2} — ABNORMAL LOW (ref >59)
GFR calc non Af Amer: 45 mL/min — ABNORMAL LOW (ref >60)
Glucose, Bld: 211 mg/dL — ABNORMAL HIGH (ref 70–99)
Glucose: 139 mg/dL — ABNORMAL HIGH (ref 65–99)
Potassium: 4.4 mmol/L (ref 3.5–5.2)
Potassium: 3.8 mmol/L (ref 3.5–5.1)
Sodium: 142 mmol/L (ref 134–144)
Sodium: 138 mmol/L (ref 135–145)

## 2018-07-15 LAB — GLUCOSE, CAPILLARY
Glucose-Capillary: 122 mg/dL — ABNORMAL HIGH (ref 70–99)
Glucose-Capillary: 140 mg/dL — ABNORMAL HIGH (ref 70–99)
Glucose-Capillary: 186 mg/dL — ABNORMAL HIGH (ref 70–99)
Glucose-Capillary: 201 mg/dL — ABNORMAL HIGH (ref 70–99)

## 2018-07-15 LAB — TSH: TSH: 2.52 u[IU]/mL (ref 0.450–4.500)

## 2018-07-15 SURGERY — LEFT HEART CATH AND CORONARY ANGIOGRAPHY
Anesthesia: LOCAL

## 2018-07-15 MED ORDER — ATORVASTATIN CALCIUM 20 MG PO TABS
20.0000 mg | ORAL_TABLET | Freq: Every day | ORAL | Status: DC
  Administered 2018-07-15: 20 mg via ORAL
  Filled 2018-07-15: qty 1

## 2018-07-15 MED ORDER — AMLODIPINE BESYLATE 5 MG PO TABS
10.0000 mg | ORAL_TABLET | Freq: Every day | ORAL | Status: DC
  Administered 2018-07-15: 17:00:00 10 mg via ORAL
  Filled 2018-07-15: qty 2

## 2018-07-15 MED ORDER — SODIUM CHLORIDE 0.9 % IV SOLN: 250.0000 mL | INTRAVENOUS | Status: DC | PRN

## 2018-07-15 MED ORDER — BUPROPION HCL ER (XL) 150 MG PO TB24
150.0000 mg | ORAL_TABLET | Freq: Every day | ORAL | Status: DC
  Administered 2018-07-15 – 2018-07-16 (×2): 150 mg via ORAL
  Filled 2018-07-15 (×2): qty 1

## 2018-07-15 MED ORDER — ONDANSETRON HCL 4 MG/2ML IJ SOLN: 4.0000 mg | Freq: Four times a day (QID) | INTRAMUSCULAR | Status: DC | PRN

## 2018-07-15 MED ORDER — SODIUM CHLORIDE 0.9% FLUSH: 3.0000 mL | INTRAVENOUS | Status: DC | PRN

## 2018-07-15 MED ORDER — LISINOPRIL-HYDROCHLOROTHIAZIDE 20-12.5 MG PO TABS: 2.0000 | ORAL_TABLET | Freq: Every day | ORAL | Status: DC

## 2018-07-15 MED ORDER — SODIUM CHLORIDE 0.9% FLUSH
3.0000 mL | Freq: Two times a day (BID) | INTRAVENOUS | Status: DC
  Administered 2018-07-16: 10:00:00 3 mL via INTRAVENOUS

## 2018-07-15 MED ORDER — HEPARIN (PORCINE) IN NACL 1000-0.9 UT/500ML-% IV SOLN
INTRAVENOUS | Status: DC | PRN
  Administered 2018-07-15 (×2): 500 mL

## 2018-07-15 MED ORDER — INSULIN ASPART 100 UNIT/ML ~~LOC~~ SOLN: 0.0000 [IU] | Freq: Three times a day (TID) | SUBCUTANEOUS | Status: DC

## 2018-07-15 MED ORDER — HEPARIN (PORCINE) IN NACL 1000-0.9 UT/500ML-% IV SOLN
INTRAVENOUS | Status: AC
  Filled 2018-07-15: qty 1000

## 2018-07-15 MED ORDER — HEPARIN SODIUM (PORCINE) 1000 UNIT/ML IJ SOLN
INTRAMUSCULAR | Status: DC | PRN
  Administered 2018-07-15: 4500 [IU] via INTRAVENOUS

## 2018-07-15 MED ORDER — IOHEXOL 350 MG/ML SOLN
INTRAVENOUS | Status: DC | PRN
  Administered 2018-07-15: 115 mL via INTRA_ARTERIAL

## 2018-07-15 MED ORDER — LIDOCAINE HCL (PF) 1 % IJ SOLN
INTRAMUSCULAR | Status: AC
  Filled 2018-07-15: qty 30

## 2018-07-15 MED ORDER — SODIUM CHLORIDE 0.9 % IV SOLN
INTRAVENOUS | Status: AC
  Administered 2018-07-15: 16:00:00 via INTRAVENOUS

## 2018-07-15 MED ORDER — MIDAZOLAM HCL 2 MG/2ML IJ SOLN
INTRAMUSCULAR | Status: DC | PRN
  Administered 2018-07-15: 1 mg via INTRAVENOUS

## 2018-07-15 MED ORDER — HYDROCHLOROTHIAZIDE 25 MG PO TABS
25.0000 mg | ORAL_TABLET | Freq: Every day | ORAL | Status: DC
  Administered 2018-07-16: 10:00:00 25 mg via ORAL
  Filled 2018-07-15: qty 1

## 2018-07-15 MED ORDER — MIDAZOLAM HCL 2 MG/2ML IJ SOLN
INTRAMUSCULAR | Status: AC
  Filled 2018-07-15: qty 2

## 2018-07-15 MED ORDER — VERAPAMIL HCL 2.5 MG/ML IV SOLN
INTRAVENOUS | Status: DC | PRN
  Administered 2018-07-15: 10 mL via INTRA_ARTERIAL

## 2018-07-15 MED ORDER — SODIUM CHLORIDE 0.9 % WEIGHT BASED INFUSION
1.0000 mL/kg/h | INTRAVENOUS | Status: DC
  Administered 2018-07-15: 10 mL/h via INTRAVENOUS

## 2018-07-15 MED ORDER — INSULIN PUMP
Freq: Three times a day (TID) | SUBCUTANEOUS | Status: DC
  Administered 2018-07-15: 18:00:00 1.3 via SUBCUTANEOUS
  Administered 2018-07-15 – 2018-07-16 (×2): via SUBCUTANEOUS
  Administered 2018-07-16: 08:00:00 13.05 via SUBCUTANEOUS
  Filled 2018-07-15: qty 1

## 2018-07-15 MED ORDER — SODIUM CHLORIDE 0.9% FLUSH: 3.0000 mL | Freq: Two times a day (BID) | INTRAVENOUS | Status: DC

## 2018-07-15 MED ORDER — LIDOCAINE HCL (PF) 1 % IJ SOLN
INTRAMUSCULAR | Status: DC | PRN
  Administered 2018-07-15: 2 mL via SUBCUTANEOUS

## 2018-07-15 MED ORDER — PNEUMOCOCCAL VAC POLYVALENT 25 MCG/0.5ML IJ INJ: 0.5000 mL | INJECTION | INTRAMUSCULAR | Status: DC | PRN

## 2018-07-15 MED ORDER — VITAMIN D 1000 UNITS PO TABS
2000.0000 [IU] | ORAL_TABLET | Freq: Every day | ORAL | Status: DC
  Administered 2018-07-15: 17:00:00 2000 [IU] via ORAL
  Filled 2018-07-15: qty 2

## 2018-07-15 MED ORDER — ADULT MULTIVITAMIN W/MINERALS CH
1.0000 | ORAL_TABLET | Freq: Every day | ORAL | Status: DC
  Administered 2018-07-15: 17:00:00 1 via ORAL
  Filled 2018-07-15 (×3): qty 1

## 2018-07-15 MED ORDER — SODIUM CHLORIDE 0.9 % WEIGHT BASED INFUSION
3.0000 mL/kg/h | INTRAVENOUS | Status: DC
  Administered 2018-07-15: 3 mL/kg/h via INTRAVENOUS

## 2018-07-15 MED ORDER — FENTANYL CITRATE (PF) 100 MCG/2ML IJ SOLN
INTRAMUSCULAR | Status: AC
  Filled 2018-07-15: qty 2

## 2018-07-15 MED ORDER — ASPIRIN EC 81 MG PO TBEC: 81.0000 mg | DELAYED_RELEASE_TABLET | Freq: Every day | ORAL | Status: DC

## 2018-07-15 MED ORDER — OMNIPOD CLASSIC PODS (GEN 3) MISC: 1.0000 | Status: DC

## 2018-07-15 MED ORDER — LISINOPRIL 40 MG PO TABS
40.0000 mg | ORAL_TABLET | Freq: Every day | ORAL | Status: DC
  Administered 2018-07-16: 40 mg via ORAL
  Filled 2018-07-15: qty 1

## 2018-07-15 MED ORDER — ASPIRIN 81 MG PO CHEW: 81.0000 mg | CHEWABLE_TABLET | Freq: Once | ORAL | Status: DC

## 2018-07-15 MED ORDER — VERAPAMIL HCL 2.5 MG/ML IV SOLN
INTRAVENOUS | Status: AC
  Filled 2018-07-15: qty 2

## 2018-07-15 MED ORDER — FOLIC ACID 1 MG PO TABS
1.0000 mg | ORAL_TABLET | Freq: Every day | ORAL | Status: DC
  Administered 2018-07-15: 17:00:00 1 mg via ORAL
  Filled 2018-07-15: qty 1

## 2018-07-15 MED ORDER — ALLOPURINOL 100 MG PO TABS
200.0000 mg | ORAL_TABLET | Freq: Every day | ORAL | Status: DC
  Administered 2018-07-15: 200 mg via ORAL
  Filled 2018-07-15: qty 2

## 2018-07-15 MED ORDER — FENTANYL CITRATE (PF) 100 MCG/2ML IJ SOLN
INTRAMUSCULAR | Status: DC | PRN
  Administered 2018-07-15: 25 ug via INTRAVENOUS

## 2018-07-15 SURGICAL SUPPLY — 14 items
CATH INFINITI 5 FR JL3.5 (CATHETERS) ×1 IMPLANT
CATH LAUNCHER 5F EBU3.5 (CATHETERS) ×1 IMPLANT
CATH LAUNCHER 5F JL3 (CATHETERS) IMPLANT
CATH OPTITORQUE TIG 4.0 5F (CATHETERS) ×1 IMPLANT
CATHETER LAUNCHER 5F JL3 (CATHETERS) ×2
DEVICE RAD COMP TR BAND LRG (VASCULAR PRODUCTS) ×1 IMPLANT
GLIDESHEATH SLEND A-KIT 6F 22G (SHEATH) ×1 IMPLANT
GUIDEWIRE INQWIRE 1.5J.035X260 (WIRE) IMPLANT
INQWIRE 1.5J .035X260CM (WIRE) ×2
KIT HEART LEFT (KITS) ×2 IMPLANT
PACK CARDIAC CATHETERIZATION (CUSTOM PROCEDURE TRAY) ×2 IMPLANT
SHEATH PROBE COVER 6X72 (BAG) ×1 IMPLANT
TRANSDUCER W/STOPCOCK (MISCELLANEOUS) ×2 IMPLANT
TUBING CIL FLEX 10 FLL-RA (TUBING) ×2 IMPLANT

## 2018-07-15 NOTE — Progress Notes (Signed)
[facility] BAND REMOVAL  LOCATION:    right radial  DEFLATED PER PROTOCOL:    Yes.    TIME BAND OFF / DRESSING APPLIED:    1815   SITE UPON ARRIVAL:    Level 0  SITE AFTER BAND REMOVAL:    Level 0  CIRCULATION SENSATION AND MOVEMENT:    Within Normal Limits   Yes.    COMMENTS:

## 2018-07-15 NOTE — Interval H&P Note (Signed)
History and Physical Interval Note:  07/15/2018 1:20 PM  Alec [name]  has presented today for surgery, with the diagnosis of abnormal stress test as part of evaluation for would likely class II-III angina.  The various methods of treatment have been discussed with the patient and family. After consideration of risks, benefits and other options for treatment, the patient has consented to  Procedure(s): LEFT HEART CATH AND CORONARY ANGIOGRAPHY (N/A) with possible PERCUTANEOUS CORONARY INTERVENTION as a surgical intervention .    The patient's history has been reviewed, patient examined, no change in status, stable for surgery.  I have reviewed the patient's chart and labs.  Questions were answered to the patient's satisfaction.   Cath Lab Visit (complete for each Cath Lab visit)  Clinical Evaluation Leading to the Procedure:   ACS: No.  Non-ACS:    Anginal Classification: CCS II  Anti-ischemic medical therapy: Minimal Therapy (1 class of medications)  Non-Invasive Test Results: High-risk stress test findings: cardiac mortality >3%/year   Prior CABG: No previous CABG   Glenetta Hew

## 2018-07-15 NOTE — Consult Note (Addendum)
[address]       [place] [address]             [phone]        Alec [name] [facility] Medical Record #[id number] Date of Birth: 02-Apr-1946  Referring: Dr. Ellyn Hack, MD Primary Care: Mayra Neer, MD Primary Cardiologist:Alec Hochrein, MD  Chief Complaint: Chest discomfort and shortness of breath  Reason for consultation: Coronary artery disease  History of Present Illness:     This is a 72 year old Caucasian male with a past medical history of hyperlipidemia, hypertension, diabetes mellitus, and remote tobacco abuse who was initially seen by cardiology in July for complaints of chest discomfort. This happened with activity, at times radiated to neck/jaw, sometimes associated with diaphoresis, and was also associated with shortness of breath. The stress test was not arranged in July, patient notes that because of continued symptoms were increasing in frequency and severity he called the cardiology office and arranged for a stress test to be done.  his most recent episode occurred yesterday.  Stress test done last week showed abnormal with ST depression in II, III, an aVF leads. There was also T-wave inversion during stress test. These changes evolved into T-wave inversion during the recovery phase. He presented to [facility] in order to undergo a cardiac catheterization. Results showed LVEF 50-55%, mid left main to proximal LAD 50% stenosis, proximal LAD to mid LAD 80% stenosis, mid RCA 95% stenosis.  Currently, wife is at his bedside. Patient is in no acute distress, denies chest pain. He does have some shortness of breath, however.  Patient currently employed as a Administrator, daily halls.  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  He also noted increasing chest pain while using a push mower recently.  Current Activity/ Functional Status: Patient is independent with mobility/ambulation,  transfers, ADL's, IADL's.   Zubrod Score: At the time of surgery this patient's most appropriate activity status/level should be described as: []     0    Normal activity, no symptoms [x]     1    Restricted in physical strenuous activity but ambulatory, able to do out light work []     2    Ambulatory and capable of self care, unable to do work activities, up and about  more than 50%  Of the time                            []     3    Only limited self care, in bed greater than 50% of waking hours []     4    Completely disabled, no self care, confined to bed or chair []     5    Moribund  Past Medical History:  Diagnosis Date  . Arthritis    "maybe in my hands" (07/15/2018)  . Bladder cancer ([place]) 2009  . CKD (chronic kidney disease), stage III ([place])   . Degenerative arthritis of shoulder region 08/2013   left  . Disorder of bursae and tendons in left shoulder region 08/2013  . Gout    "on daily RX" (07/15/2018)  . History of kidney stones   . Hyperlipidemia   . Hypertension    under control with meds., has been on med. > 10 yr.  . IDDM (insulin dependent diabetes mellitus) ([place])     Past  Surgical History:  Procedure Laterality Date  . CARDIAC CATHETERIZATION  07/15/2018  . CHOLECYSTECTOMY  08/29/2012   Procedure: LAPAROSCOPIC CHOLECYSTECTOMY WITH INTRAOPERATIVE CHOLANGIOGRAM;  Surgeon: Imogene Burn. Georgette Dover, MD;  Location: [facility];  Service: General;  Laterality: N/A;  . SHOULDER ARTHROSCOPY W/ ROTATOR CUFF REPAIR Right 2018  . SHOULDER ARTHROSCOPY WITH SUBACROMIAL DECOMPRESSION, ROTATOR CUFF REPAIR AND BICEP TENDON REPAIR Left 09/03/2013   Procedure: LEFT SHOULDER ARTHROSCOPY WITH EXTENSIVE DEBRIDMENT, DISTAL CLAVICULECTOMY, ROTATOR CUFF REPAIR AND SUBACROMIAL DECOMPRESSION PARTIAL ACRIOMIOPLASTY WITH CORACOACROMIAL RELEASE;  Surgeon: Renette Butters, MD;  Location: [facility];  Service: Orthopedics;  Laterality: Left;  . TRANSURETHRAL RESECTION OF BLADDER TUMOR WITH  MITOMYCIN-C  08/24/2008    Social History   Tobacco Use  Smoking Status Former Smoker  . Packs/day: 2.00  . Years: 25.00  . Pack years: 50.00  . Types: Cigarettes  . Last attempt to quit: 09/11/1997  . Years since quitting: 20.8  Smokeless Tobacco Never Used    Social History   Substance and Sexual Activity  Alcohol Use Not Currently   Comment: 07/15/2018 "nothing since the mid 1990s"  Patient is still working-drives a truck locally  Allergy: No Known Allergies  Current Facility-Administered Medications  Medication Dose Route Frequency Provider Last Rate Last Dose  . 0.9 %  sodium chloride infusion   Intravenous Continuous Leonie Man, MD      . 0.9 %  sodium chloride infusion  250 mL Intravenous PRN Leonie Man, MD      . allopurinol (ZYLOPRIM) tablet 200 mg  200 mg Oral QPC supper Leonie Man, MD      . amLODipine (NORVASC) tablet 10 mg  10 mg Oral QPC supper Leonie Man, MD      . Derrill Memo ON 07/16/2018] aspirin EC tablet 81 mg  81 mg Oral QPC supper Leonie Man, MD      . atorvastatin (LIPITOR) tablet 20 mg  20 mg Oral QPC supper Leonie Man, MD      . buPROPion (WELLBUTRIN XL) 24 hr tablet 150 mg  150 mg Oral Daily Leonie Man, MD      . cholecalciferol (VITAMIN D) tablet 2,000 Units  2,000 Units Oral QPC supper Leonie Man, MD      . folic acid (FOLVITE) tablet 1 mg  1 mg Oral QPC supper Leonie Man, MD      . Derrill Memo ON 07/16/2018] lisinopril (PRINIVIL,ZESTRIL) tablet 40 mg  40 mg Oral Daily Leonie Man, MD       And  . Derrill Memo ON 07/16/2018] hydrochlorothiazide (HYDRODIURIL) tablet 25 mg  25 mg Oral Daily Leonie Man, MD      . insulin pump   Subcutaneous TID AC, HS, 0200 Leonie Man, MD      . multivitamin with minerals tablet 1 tablet  1 tablet Oral QPC supper Leonie Man, MD      . ondansetron [facility]) injection 4 mg  4 mg Intravenous Q6H PRN Leonie Man, MD      . pneumococcal 23 valent vaccine  (PNU-IMMUNE) injection 0.5 mL  0.5 mL Intramuscular Prior to discharge Leonie Man, MD      . sodium chloride flush (NS) 0.9 % injection 3 mL  3 mL Intravenous Q12H Leonie Man, MD      . sodium chloride flush (NS) 0.9 % injection 3 mL  3 mL Intravenous PRN Leonie Man, MD  Medications Prior to Admission  Medication Sig Dispense Refill Last Dose  . allopurinol (ZYLOPRIM) 100 MG tablet Take 200 mg by mouth daily after supper.   07/14/2018 at Unknown time  . amLODipine (NORVASC) 10 MG tablet Take 10 mg by mouth daily after supper.    07/14/2018 at Unknown time  . aspirin 81 MG tablet Take 81 mg by mouth daily after supper.    07/15/2018 at 0530  . atorvastatin (LIPITOR) 20 MG tablet Take 20 mg by mouth daily after supper.    07/14/2018 at Unknown time  . buPROPion (WELLBUTRIN XL) 150 MG 24 hr tablet Take 150 mg by mouth daily.   07/14/2018 at Unknown time  . Cholecalciferol (VITAMIN D) 2000 units tablet Take 2,000 Units by mouth daily after supper.   07/14/2018 at Unknown time  . folic acid (FOLVITE) 1 MG tablet Take 1 mg by mouth daily after supper.   07/14/2018 at Unknown time  . Glucosamine-Chondroitin (OSTEO BI-FLEX REGULAR STRENGTH PO) Take 1 tablet by mouth daily after supper.   07/14/2018 at Unknown time  . insulin aspart (NOVOLOG) 100 UNIT/ML injection INJECT 85 UNITS SUBCUTANEOUSLY ONCE DAILY WITH  INSULIN  PUMP Dx Code E11.65 70 mL 3 Taking  . Insulin Disposable Pump (OMNIPOD 5 PACK) MISC Place 1 each onto the skin as directed. Place 1 onto skin as directed every 48 hours 15 each 3 07/15/2018 at Unknown time  . lisinopril-hydrochlorothiazide (PRINZIDE,ZESTORETIC) 20-12.5 MG tablet Take 2 tablets by mouth daily after supper.    Past Week at Unknown time  . methotrexate (RHEUMATREX) 2.5 MG tablet Take 15 mg by mouth every Thursday. Caution:Chemotherapy. Protect from light.   Past Week at Unknown time  . Multiple Vitamins-Minerals (ADVANCED DIABETIC MULTIVITAMIN) TABS  Take 2 tablets by mouth daily after supper.   07/14/2018 at Unknown time  . OVER THE COUNTER MEDICATION Take 1 tablet by mouth 2 (two) times daily. cognium otc supplement   07/14/2018 at Unknown time  . VICTOZA 18 MG/3ML SOPN INJECT 1.8 MG SUBCUTANEOUSLY ONCE DAILY AT THE SAME TIME EVERYDAY (Patient taking differently: Inject 1.2 mg into the skin daily. ) 27 mL 1 07/14/2018 at Unknown time  . glucose blood (FREESTYLE LITE) test strip USE AS INSTRUCTED TO TEST BLOOD SUGARS 3 TIMES DAILY. DX CODE-E11.65 100 each 3 Taking  . rOPINIRole (REQUIP) 0.5 MG tablet TAKE 1 TABLET (0.5 MG TOTAL) BY MOUTH AT BEDTIME. 30 tablet 3 More than a month at Unknown time    Family History  Problem Relation Age of Onset  . Diabetes Mother   . Cancer Father   . Coronary artery disease Father        MI at age 89.   . Sudden death Son   . CAD Maternal Grandfather        MI in his 24's  . CAD Paternal Grandfather    Review of Systems:      Cardiac Review of Systems: Y or  [  N  ]= no  Chest Pain [  N  ] Exertional SOB  [Y  ]  Orthopnea Aqua.Slicker  ]  Pedal Edema [  N ]    Palpitations [  N] Syncope  Aqua.Slicker  ]   Presyncope [ N  ]  General Review of Systems: [Y] = yes [ N ]=no Constitional:night sweats Aqua.Slicker  ]; fever Aqua.Slicker  ]; or chills [ N ]  Eye : blurred vision [ Y-WHEN SUGAR IS LOW ]; diplopia [ N  ];  Amaurosis fugax[ N ]; Resp: cough Aqua.Slicker  ];  wheezing[ N ];  hemoptysis[ N ];  GI:   vomiting[ N ];  dysphagia[N  ]; melena[N  ];  hematochezia [ N ]; JM:EQASTMHDQ[ N ];   dysuria [N  ];  nocturia[N  ];                Skin: rash, swelling[ N ];,  peripheral edema[ N ];  or itching[ N ]; Insulin pump on right outer thigh currently Musculosketetal: myalgias[  N];  joint swelling[N  ]   Heme/Lymph: bruising[ N ];  bleeding[ N ];  anemia[  ];  Neuro: TIA[ N ]; ;  stroke[ N ];  vertigo[N  ];  seizures[ N ];    difficulty walking[ N ];  Endocrine: diabetes[Y  ];  thyroid  dysfunction[ N ];                            Physical Exam: BP (!) 163/64 (BP Location: Left Arm)   Pulse 64   Temp 97.9 F (36.6 C) (Oral)   Resp 17   Ht 5\' 6"  (1.676 m)   Wt 88 kg   SpO2 99%   BMI 31.31 kg/m    General appearance: alert, cooperative and no distress Head: Normocephalic, without obvious abnormality, atraumatic Neck: no carotid bruit, no JVD and supple, symmetrical, trachea midline Resp: clear to auscultation bilaterally Cardio: RRR, no murmur GI: Soft, non tender, bowel sounds present Extremities: No LE edema;palpable DP/PT bilaterally Neurologic: Grossly normal Cath site right radial is intact Patient has arthritic changes in both hands fingers, he notes he has problems with numbness in his hands  Diagnostic Studies & Laboratory data: LEFT HEART CATH AND CORONARY ANGIOGRAPHY  Conclusion     LV Findings:  The left ventricular systolic function is low-normal. The left ventricular ejection fraction is 50-55% by visual estimate.  LV end diastolic pressure is moderately elevated.  Angiographic Findings:  Mid RCA-1 lesion is 40% stenosed. Mid RCA-2 lesion is 95% stenosed. Dist RCA lesion is 95% stenosed just at the takeoff of the RPAV.  Mid LM to Prox LAD lesion is 50% stenosed. -Extensive long vessel that is calcified. Very small circumflex and ramus.  Prox LAD lesion is 90% stenosed at takeoff of 1st Diag. Prox LAD to Mid LAD lesion is 80% stenosed.   Severe multivessel disease with distal left main calcified 40 to 50% stenosis, proximal LAD tandem 90 and 80% lesions covering along area and including major first diagonal branch.  This is in conjunction with 2 significant mid and distal RCA lesions of 95% and then a 90% bifurcation lesion just prior to the takeoff of the PAB. Likely low normal EF but difficult to assess wall motion because of ectopy.  Recommend 2D echo prior to discharge. Moderate elevated LVEDP.  Based on the patient's anatomy, I  think he is best served for CABG.  We will consult CT surgery. Given his reduced renal function, we will admit him for overnight observation for IV fluid hydration.  Would like to consider increasing Crestor dose on discharge. Would start antianginal medication if blood pressure room tolerates use amlodipine versus Imdur and provide Seldinger nitroglycerin as needed.  Recommend Aspirin 81mg  daily for severe multivessel CAD.   Awaiting for echocardiogram results   Recent Radiology Findings:  Chest x-ray has been  ordered   Recent Lab Findings: Lab Results  Component Value Date   WBC 5.4 07/14/2018   HGB 13.9 07/14/2018   HCT 39.1 07/14/2018   PLT 180 07/14/2018   GLUCOSE 139 (H) 07/14/2018   CHOL 108 03/28/2016   TRIG 130.0 03/28/2016   HDL 28.80 (L) 03/28/2016   LDLCALC 54 03/28/2016   ALT 32 06/16/2018   AST 23 06/16/2018   NA 142 07/14/2018   K 4.4 07/14/2018   CL 101 07/14/2018   CREATININE 1.68 (H) 07/14/2018   BUN 21 07/14/2018   CO2 23 07/14/2018   TSH 2.520 07/14/2018   HGBA1C 6.8 (H) 06/16/2018    Assessment / Plan:   1. Coronary artery disease-would benefit from CABG 2. History of hypertension-on Amlodipine 10 mg daily, Lisinopril 40 mg daily, and HCTZ 25 mg daily. 3. History of hyperlipidemia-on Atorvastatin 4. History of IDDM-HGA1C 6.8 5. Creatinine today 1.68. He is on both Lisinopril and HCTZ. Appears creatinine has been 1.5-1.8 for several years  Chronic Kidney Disease   Stage I     GFR >90  Stage II    GFR 60-89  Stage IIIA GFR 45-59  Stage IIIB GFR 30-44  Stage IV   GFR 15-29  Stage V    GFR  <15  Lab Results  Component Value Date   CREATININE 1.68 (H) 07/14/2018   Estimated Creatinine Clearance: 42.5 mL/min (A) (by C-G formula based on SCr of 1.68 mg/dL (H)).  Stage IIIb chronic kidney disease Diabetes with complications of coronary artery disease  I reviewed the patient's cardiac cath findings symptoms of progressive angina for for the  past 3 months, positive stress test and cath findings With his diabetic state and multivessel coronary artery disease I recommended proceeding with coronary artery bypass grafting.  On review of the films there is some concern if the patient has an anomalous circumflex circumflex that was never identified.  Patient's to be hydrated because of his chronic kidney failure after cath.  He is agreeable with proceeding with coronary artery bypass grafting Monday, October 21 if his renal function remains stable.   Risk of coronary artery bypass grafting has been discussed with the patient and his wife in detail, including the risk of death infection stroke myocardial infarction bleeding blood transfusion.  He is aware of the need for intensive care's stay, tubes and lines, postop ventilation.  Their questions have been answered and he is willing to proceed.  Grace Isaac MD      [address] [place] [address] Office [phone]   [place]

## 2018-07-16 ENCOUNTER — Ambulatory Visit: Payer: Medicare Other | Admitting: Cardiology

## 2018-07-16 ENCOUNTER — Encounter (HOSPITAL_COMMUNITY): Payer: Self-pay | Admitting: Cardiology

## 2018-07-16 ENCOUNTER — Ambulatory Visit (HOSPITAL_BASED_OUTPATIENT_CLINIC_OR_DEPARTMENT_OTHER): Payer: Medicare Other

## 2018-07-16 ENCOUNTER — Ambulatory Visit (HOSPITAL_COMMUNITY): Payer: Medicare Other

## 2018-07-16 DIAGNOSIS — I10 Essential (primary) hypertension: Secondary | ICD-10-CM

## 2018-07-16 DIAGNOSIS — I2584 Coronary atherosclerosis due to calcified coronary lesion: Secondary | ICD-10-CM | POA: Diagnosis not present

## 2018-07-16 DIAGNOSIS — Z01818 Encounter for other preprocedural examination: Secondary | ICD-10-CM | POA: Diagnosis not present

## 2018-07-16 DIAGNOSIS — I129 Hypertensive chronic kidney disease with stage 1 through stage 4 chronic kidney disease, or unspecified chronic kidney disease: Secondary | ICD-10-CM | POA: Diagnosis not present

## 2018-07-16 DIAGNOSIS — N183 Chronic kidney disease, stage 3 (moderate): Secondary | ICD-10-CM | POA: Diagnosis not present

## 2018-07-16 DIAGNOSIS — I25119 Atherosclerotic heart disease of native coronary artery with unspecified angina pectoris: Secondary | ICD-10-CM | POA: Diagnosis not present

## 2018-07-16 DIAGNOSIS — E1122 Type 2 diabetes mellitus with diabetic chronic kidney disease: Secondary | ICD-10-CM | POA: Diagnosis not present

## 2018-07-16 DIAGNOSIS — R9439 Abnormal result of other cardiovascular function study: Secondary | ICD-10-CM | POA: Diagnosis not present

## 2018-07-16 DIAGNOSIS — Z01810 Encounter for preprocedural cardiovascular examination: Secondary | ICD-10-CM | POA: Diagnosis not present

## 2018-07-16 LAB — PULMONARY FUNCTION TEST
FEF 25-75 Post: 2.89 L/sec
FEF 25-75 Pre: 2.7 L/sec
FEF2575-%Change-Post: 7 %
FEF2575-%Pred-Post: 136 %
FEF2575-%Pred-Pre: 127 %
FEV1-%Change-Post: 1 %
FEV1-%Pred-Post: 96 %
FEV1-%Pred-Pre: 94 %
FEV1-Post: 2.65 L
FEV1-Pre: 2.61 L
FEV1FVC-%Change-Post: 0 %
FEV1FVC-%Pred-Pre: 111 %
FEV6-%Change-Post: 2 %
FEV6-%Pred-Post: 91 %
FEV6-%Pred-Pre: 89 %
FEV6-Post: 3.24 L
FEV6-Pre: 3.17 L
FEV6FVC-%Change-Post: 0 %
FEV6FVC-%Pred-Post: 106 %
FEV6FVC-%Pred-Pre: 105 %
FVC-%Change-Post: 1 %
FVC-%Pred-Post: 85 %
FVC-%Pred-Pre: 84 %
FVC-Post: 3.24 L
FVC-Pre: 3.19 L
Post FEV1/FVC ratio: 82 %
Post FEV6/FVC ratio: 100 %
Pre FEV1/FVC ratio: 82 %
Pre FEV6/FVC Ratio: 99 %

## 2018-07-16 LAB — LIPID PANEL
Cholesterol: 94 mg/dL (ref 0–200)
HDL: 25 mg/dL — ABNORMAL LOW (ref >40)
LDL Cholesterol: 45 mg/dL (ref 0–99)
Total CHOL/HDL Ratio: 3.8 RATIO
Triglycerides: 120 mg/dL (ref <150)
VLDL: 24 mg/dL (ref 0–40)

## 2018-07-16 LAB — BASIC METABOLIC PANEL
Anion gap: 6 (ref 5–15)
BUN: 15 mg/dL (ref 8–23)
CO2: 24 mmol/L (ref 22–32)
Calcium: 9 mg/dL (ref 8.9–10.3)
Chloride: 109 mmol/L (ref 98–111)
Creatinine, Ser: 1.5 mg/dL — ABNORMAL HIGH (ref 0.61–1.24)
GFR calc Af Amer: 53 mL/min — ABNORMAL LOW (ref >60)
GFR calc non Af Amer: 45 mL/min — ABNORMAL LOW (ref >60)
Glucose, Bld: 86 mg/dL (ref 70–99)
Potassium: 4.1 mmol/L (ref 3.5–5.1)
Sodium: 139 mmol/L (ref 135–145)

## 2018-07-16 LAB — GLUCOSE, CAPILLARY
Glucose-Capillary: 97 mg/dL (ref 70–99)
Glucose-Capillary: 79 mg/dL (ref 70–99)
Glucose-Capillary: 93 mg/dL (ref 70–99)

## 2018-07-16 LAB — ECHOCARDIOGRAM COMPLETE
Height: 66 in
Weight: 3139.35 oz

## 2018-07-16 MED ORDER — ALBUTEROL SULFATE (2.5 MG/3ML) 0.083% IN NEBU
2.5000 mg | INHALATION_SOLUTION | Freq: Once | RESPIRATORY_TRACT | Status: AC
  Administered 2018-07-16: 2.5 mg via RESPIRATORY_TRACT

## 2018-07-16 MED ORDER — ENOXAPARIN SODIUM 40 MG/0.4ML ~~LOC~~ SOLN
40.0000 mg | SUBCUTANEOUS | Status: DC
  Administered 2018-07-16: 40 mg via SUBCUTANEOUS
  Filled 2018-07-16: qty 0.4

## 2018-07-16 MED ORDER — ISOSORBIDE MONONITRATE ER 30 MG PO TB24: 30.0000 mg | ORAL_TABLET | Freq: Every day | ORAL | 3 refills | Status: DC

## 2018-07-16 MED ORDER — ROPINIROLE HCL 0.5 MG PO TABS
0.5000 mg | ORAL_TABLET | Freq: Every day | ORAL | Status: DC
  Filled 2018-07-16: qty 1

## 2018-07-16 MED ORDER — ISOSORBIDE MONONITRATE ER 30 MG PO TB24
30.0000 mg | ORAL_TABLET | Freq: Every day | ORAL | Status: DC
  Administered 2018-07-16: 30 mg via ORAL
  Filled 2018-07-16: qty 1

## 2018-07-16 NOTE — Progress Notes (Signed)
Inpatient Diabetes Program Recommendations  AACE/ADA: New Consensus Statement on Inpatient Glycemic Control (2015)  Target Ranges:  Prepandial:   less than 140 mg/dL      Peak postprandial:   less than 180 mg/dL (1-2 hours)      Critically ill patients:  140 - 180 mg/dL   Lab Results  Component Value Date   GLUCAP 93 07/16/2018   HGBA1C 6.8 (H) 06/16/2018    Review of Glycemic Control Results for Alec [name], Alec [name] (MRN [id number]) as of 07/16/2018 14:20  Ref. Range 07/15/2018 06:38 07/15/2018 09:40 07/15/2018 15:13 07/15/2018 18:17 07/15/2018 22:13 07/16/2018 06:50 07/16/2018 12:35  Glucose-Capillary Latest Ref Range: 70 - 99 mg/dL 122 (H) 140 (H) 97 186 (H) 201 (H) 79 93   Diabetes history: Type 2 DM  Outpatient Diabetes medications:  Omnipod insulin pump:  Basal rate 2.2 units/hr 1 units for ever 10 grams of CHO/1 unit drops blood sugar approximately 50 mg/dL-Goal 120 mg/dL Current orders for Inpatient glycemic control:  Insulin pump  Inpatient Diabetes Program Recommendations:    Spoke with patient and wife regarding DM. Last A1C was 6.8% which is very good.  He see's Dr. Dwyane Dee.  We discussed protocol for CABG and that he will be on insulin drip initially and then can be transitioned to insulin pump once alert and oriented.  Patient states that he has been on insulin pump for over 3 years and that he has seen improvements in A1C.  He has Rx. For freestyle CGM but has not had filled in a while due to having trouble with sensors coming off.  He now has a new "glue" that is supposed to help with adhesion.  Recommended that he call and get rx. Filled for freestyle Elenor Legato so that he can use it after surgery upon d/c home.  Patient and wife verbalized understanding.  Will follow.   Thanks,  Adah Perl, RN, BC-ADM Inpatient Diabetes Coordinator Pager [phone] (8a-5p)

## 2018-07-16 NOTE — Progress Notes (Addendum)
Progress Note  Patient Name: Alec [name] Date of Encounter: 07/16/2018  Primary Cardiologist: Minus Breeding, MD   Subjective   No chest pain, baseline shortness of breath  Inpatient Medications    Scheduled Meds: . allopurinol  200 mg Oral QPC supper  . amLODipine  10 mg Oral QPC supper  . aspirin EC  81 mg Oral QPC supper  . atorvastatin  20 mg Oral QPC supper  . buPROPion  150 mg Oral Daily  . cholecalciferol  2,000 Units Oral QPC supper  . enoxaparin (LOVENOX) injection  40 mg Subcutaneous Q24H  . folic acid  1 mg Oral QPC supper  . lisinopril  40 mg Oral Daily   And  . hydrochlorothiazide  25 mg Oral Daily  . insulin pump   Subcutaneous TID AC, HS, 0200  . multivitamin with minerals  1 tablet Oral QPC supper  . sodium chloride flush  3 mL Intravenous Q12H   Continuous Infusions: . sodium chloride     PRN Meds: sodium chloride, ondansetron (ZOFRAN) IV, pneumococcal 23 valent vaccine, sodium chloride flush   Vital Signs    Vitals:   07/15/18 1509 07/15/18 2017 07/16/18 0652 07/16/18 0815  BP: (!) 163/64 (!) 142/62 133/65 139/62  Pulse: 64 71 60 64  Resp: 17 17 18 17   Temp: 97.9 F (36.6 C) 98.3 F (36.8 C) 98.5 F (36.9 C) 98 F (36.7 C)  TempSrc: Oral Oral Oral Oral  SpO2: 99% 96% 95% 98%  Weight:   89 kg   Height:        Intake/Output Summary (Last 24 hours) at 07/16/2018 0913 Last data filed at 07/15/2018 2008 Gross per 24 hour  Intake 480 ml  Output 350 ml  Net 130 ml   Filed Weights   07/15/18 0633 07/16/18 0652  Weight: 88 kg 89 kg    Telemetry    Sinus rhythm - Personally Reviewed  ECG    No new tracings - Personally Reviewed  Physical Exam   GEN: No acute distress.   Neck: No JVD Cardiac: RRR, no murmurs, rubs, or gallops.  Respiratory: Clear to auscultation bilaterally. GI: Soft, nontender, non-distended  MS: No edema; No deformity. Neuro:  Nonfocal  Psych: Normal affect   Labs    Chemistry Recent Labs  Lab  07/14/18 0810 07/15/18 2235 07/16/18 0731  NA 142 138 139  K 4.4 3.8 4.1  CL 101 104 109  CO2 23 23 24   GLUCOSE 139* 211* 86  BUN 21 18 15   CREATININE 1.68* 1.52* 1.50*  CALCIUM 9.9 8.8* 9.0  GFRNONAA 41* 45* 45*  GFRAA 47* 52* 53*  ANIONGAP  --  11 6     Hematology Recent Labs  Lab 07/14/18 0810 07/15/18 2235  WBC 5.4 4.5  RBC 4.10* 3.71*  HGB 13.9 12.1*  HCT 39.1 34.7*  MCV 95 93.5  MCH 33.9* 32.6  MCHC 35.5 34.9  RDW 13.3 13.0  PLT 180 152    Cardiac EnzymesNo results for input(s): TROPONINI in the last 168 hours. No results for input(s): TROPIPOC in the last 168 hours.   BNPNo results for input(s): BNP, PROBNP in the last 168 hours.   DDimer No results for input(s): DDIMER in the last 168 hours.   Radiology    No results found.  Cardiac Studies    Left heart cath 07/15/18:  LV Findings:  The left ventricular systolic function is low-normal. The left ventricular ejection fraction is 50-55% by visual estimate.  LV end diastolic pressure is moderately elevated.  Angiographic Findings:  Mid RCA-1 lesion is 40% stenosed. Mid RCA-2 lesion is 95% stenosed. Dist RCA lesion is 95% stenosed just at the takeoff of the RPAV.  Mid LM to Prox LAD lesion is 50% stenosed. -Extensive long vessel that is calcified. Very small circumflex and ramus.  Prox LAD lesion is 90% stenosed at takeoff of 1st Diag. Prox LAD to Mid LAD lesion is 80% stenosed.   Severe multivessel disease with distal left main calcified 40 to 50% stenosis, proximal LAD tandem 90 and 80% lesions covering along area and including major first diagonal branch.  This is in conjunction with 2 significant mid and distal RCA lesions of 95% and then a 90% bifurcation lesion just prior to the takeoff of the PAB. Likely low normal EF but difficult to assess wall motion because of ectopy.  Recommend 2D echo prior to discharge. Moderate elevated LVEDP.  Based on the patient's anatomy, I think he is best  served for CABG.  We will consult CT surgery. Given his reduced renal function, we will admit him for overnight observation for IV fluid hydration.  Would like to consider increasing Crestor dose on discharge. Would start antianginal medication if blood pressure room tolerates use amlodipine versus Imdur and provide Seldinger nitroglycerin as needed.  Recommend Aspirin 81mg  daily for severe multivessel CAD.    Echo 07/16/18: pending read    Patient Profile     72 y.o. male with history of abnormal POET presented for scheduled heart cath on 07/15/18. He also has a history of HTN, DM, HLD, and CKD stage III.   Assessment & Plan    1. Multivessel CAD - TCTS consulted and will proceed with CABG on 07/20/18. The patient is in agreement.  - troponins not collected - heparin was not started - beta blocker held for bradycardia - telemetry in the 60s   2. CKD stage III - sCr 1.50 and K 4.1 - baseline creatinine appears to be 1.5   3. DM - insulin pump at home - continue this   4. HTN - pressure is controlled  - continue norvasc, lisinopril, and HCTZ    5. HLD - continue statin - 20 mg lipitor - 07/16/2018: Cholesterol 94; HDL 25; LDL Cholesterol 45; Triglycerides 120; VLDL 24    For questions or updates, please contact [place] Please consult [email or web address] for contact info under        Signed, Ledora Bottcher, PA  07/16/2018, 9:13 AM

## 2018-07-16 NOTE — Progress Notes (Signed)
  Echocardiogram 2D Echocardiogram has been performed.  Alec [name] 07/16/2018, 11:38 AM

## 2018-07-16 NOTE — Progress Notes (Signed)
Pre-op Cardiac Surgery  Carotid Findings:  1-39% ICA plaquing.  Vertebral artery flow is antegrade.   Upper Extremity Right Left  Brachial Pressures 138T 139T  Radial Waveforms T T  Ulnar Waveforms M T  Palmar Arch (Allen's Test) WNL WNL       Lower  Extremity Right Left  Dorsalis Pedis >255B >255B  Anterior Tibial    Posterior Tibial >255B >255B  TBI 0.65 0.76  Ankle/Brachial Indices Non comp Non comp    Findings: ABIs are not ascertained secondary to probable medial calcification.  Right TBI is abnormal.  Left TBI is within normal limits.

## 2018-07-16 NOTE — Discharge Summary (Addendum)
Discharge Summary    Patient ID: Alec [name] MRN: [id number]; DOB: 06/27/1946  Admit date: 07/15/2018 Discharge date: 07/16/2018  Primary Care Provider: Mayra Neer, MD  Primary Cardiologist: Minus Breeding, MD  Primary Electrophysiologist:  None   Discharge Diagnoses    Principal Problem:   Angina, class II ([place]) Active Problems:   Diabetic nephropathy associated with type 2 diabetes mellitus ([place])   Essential hypertension, benign   Hyperlipidemia LDL goal <100   Abnormal stress electrocardiography using treadmill   Coronary artery disease involving native coronary artery with angina pectoris ([place])   Allergies No Known Allergies  Diagnostic Studies/Procedures     LV Findings:  The left ventricular systolic function is low-normal. The left ventricular ejection fraction is 50-55% by visual estimate.  LV end diastolic pressure is moderately elevated.  Angiographic Findings:  Mid RCA-1 lesion is 40% stenosed. Mid RCA-2 lesion is 95% stenosed. Dist RCA lesion is 95% stenosed just at the takeoff of the RPAV.  Mid LM to Prox LAD lesion is 50% stenosed. -Extensive long vessel that is calcified. Very small circumflex and ramus.  Prox LAD lesion is 90% stenosed at takeoff of 1st Diag. Prox LAD to Mid LAD lesion is 80% stenosed.   Severe multivessel disease with distal left main calcified 40 to 50% stenosis, proximal LAD tandem 90 and 80% lesions covering along area and including major first diagonal branch.  This is in conjunction with 2 significant mid and distal RCA lesions of 95% and then a 90% bifurcation lesion just prior to the takeoff of the PAB. Likely low normal EF but difficult to assess wall motion because of ectopy.  Recommend 2D echo prior to discharge. Moderate elevated LVEDP.  Based on the patient's anatomy, I think he is best served for CABG.  We will consult CT surgery. Given his reduced renal function, we will admit him for overnight observation for  IV fluid hydration.  Would like to consider increasing Crestor dose on discharge. Would start antianginal medication if blood pressure room tolerates use amlodipine versus Imdur and provide Seldinger nitroglycerin as needed.  Recommend Aspirin 81mg  daily for severe multivessel CAD.   History of Present Illness     Alec [name] is a 72 y.o. male who presents for follow up for pre-cardiac cath evaluation. 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 Due to CVRF of DM, Age, Gender, HTN, and HL, he was schedule for POET. Stress test was completed on 07/07/2018 and found to be abnormal with ST depression in II, III, an aVF leads. There was also T-wave inversion during stress test. These changes evolved into T-wave inversion during the recovery phase.   He continued to have mild chest pressure with exertion but has been careful not to over do activity. He was scheduled for cardiac cath on 07/15/2018.    Hospital Course     Consultants: TCTS  CAD, abnormal stress test Patient presented for scheduled heart cath, completed on 07/15/18. Heart cath revealed multi-vessel disease thought to be amenable to CABG. TCTS consulted and deemed an appropriate candidate for CABG, scheduled for 07/20/18. Right radial site c/d/I without hematoma.  Patient will discharge today and be readmitted on Monday 07/20/18 for hydration for planned CABG.    Hypertension Continue norvasc, lisinopril, and HCTZ. Will start low dose imdur for anti-anginal properties.   Hyperlipidemia Continue statin.  DM Continue home insulin pump.   Pt seen and examined by Dr. Acie Fredrickson and deemed appropriate for discharge _____________  Discharge Vitals Blood pressure (!) 155/68, pulse 67, temperature 97.8 F (36.6 C), temperature source Oral, resp. rate 17, height 5\' 6"  (1.676 m), weight 89 kg,  SpO2 99 %.  Filed Weights   07/15/18 0633 07/16/18 0652  Weight: 88 kg 89 kg    Labs & Radiologic Studies    CBC Recent Labs    07/14/18 0810 07/15/18 2235  WBC 5.4 4.5  HGB 13.9 12.1*  HCT 39.1 34.7*  MCV 95 93.5  PLT 180 811   Basic Metabolic Panel Recent Labs    07/15/18 2235 07/16/18 0731  NA 138 139  K 3.8 4.1  CL 104 109  CO2 23 24  GLUCOSE 211* 86  BUN 18 15  CREATININE 1.52* 1.50*  CALCIUM 8.8* 9.0   Liver Function Tests No results for input(s): AST, ALT, ALKPHOS, BILITOT, PROT, ALBUMIN in the last 72 hours. No results for input(s): LIPASE, AMYLASE in the last 72 hours. Cardiac Enzymes No results for input(s): CKTOTAL, CKMB, CKMBINDEX, TROPONINI in the last 72 hours. BNP Invalid input(s): POCBNP D-Dimer No results for input(s): DDIMER in the last 72 hours. Hemoglobin A1C No results for input(s): HGBA1C in the last 72 hours. Fasting Lipid Panel Recent Labs    07/16/18 0731  CHOL 94  HDL 25*  LDLCALC 45  TRIG 120  CHOLHDL 3.8   Thyroid Function Tests Recent Labs    07/14/18 0810  TSH 2.520   _____________  Dg Chest 2 View  Result Date: 07/16/2018 CLINICAL DATA:  Preoperative evaluation EXAM: CHEST - 2 VIEW COMPARISON:  01/03/2009 FINDINGS: The heart size and mediastinal contours are within normal limits. Both lungs are clear. The visualized skeletal structures are unremarkable. IMPRESSION: No active cardiopulmonary disease. Electronically Signed   By: Inez Catalina M.D.   On: 07/16/2018 09:19   Disposition   Pt is being discharged home today in good condition.  Follow-up Plans & Appointments    Follow-up Information    Minus Breeding, MD Follow up in 4 week(s).   Specialty:  Cardiology Contact information: [address] [place] [address] [phone]          Discharge Instructions    Diet - low sodium heart healthy   Complete by:  As directed    Discharge instructions   Complete by:  As directed    No  driving for 2 days. No lifting over 5 lbs for 1 week. No sexual activity for 1 week.  Keep procedure site clean & dry. If you notice increased pain, swelling, bleeding or pus, call/return!  You may shower, but no soaking baths/hot tubs/pools for 1 week.   Increase activity slowly   Complete by:  As directed       Discharge Medications   Allergies as of 07/16/2018   No Known Allergies     Medication List    TAKE these medications   ADVANCED DIABETIC MULTIVITAMIN Tabs Take 2 tablets by mouth daily after supper.   allopurinol 100 MG tablet Commonly known as:  ZYLOPRIM Take 200 mg by mouth daily after supper.   amLODipine 10 MG tablet Commonly known as:  NORVASC Take 10 mg by mouth daily after supper.   aspirin 81 MG tablet Take 81 mg by mouth daily after supper.   atorvastatin 20 MG tablet Commonly known as:  LIPITOR Take 20 mg by mouth daily after  supper.   buPROPion 150 MG 24 hr tablet Commonly known as:  WELLBUTRIN XL Take 150 mg by mouth daily.   folic acid 1 MG tablet Commonly known as:  FOLVITE Take 1 mg by mouth daily after supper.   glucose blood test strip USE AS INSTRUCTED TO TEST BLOOD SUGARS 3 TIMES DAILY. DX CODE-E11.65   insulin aspart 100 UNIT/ML injection Commonly known as:  novoLOG INJECT 85 UNITS SUBCUTANEOUSLY ONCE DAILY WITH  INSULIN  PUMP Dx Code E11.65   isosorbide mononitrate 30 MG 24 hr tablet Commonly known as:  IMDUR Take 1 tablet (30 mg total) by mouth daily.   lisinopril-hydrochlorothiazide 20-12.5 MG tablet Commonly known as:  PRINZIDE,ZESTORETIC Take 2 tablets by mouth daily after supper.   methotrexate 2.5 MG tablet Commonly known as:  RHEUMATREX Take 15 mg by mouth every Thursday. Caution:Chemotherapy. Protect from light.   OMNIPOD 5 PACK Misc Place 1 each onto the skin as directed. Place 1 onto skin as directed every 48 hours   OSTEO BI-FLEX REGULAR STRENGTH PO Take 1 tablet by mouth daily after supper.   OVER THE COUNTER  MEDICATION Take 1 tablet by mouth 2 (two) times daily. cognium otc supplement   rOPINIRole 0.5 MG tablet Commonly known as:  REQUIP TAKE 1 TABLET (0.5 MG TOTAL) BY MOUTH AT BEDTIME.   VICTOZA 18 MG/3ML Sopn Generic drug:  liraglutide INJECT 1.8 MG SUBCUTANEOUSLY ONCE DAILY AT THE SAME TIME EVERYDAY What changed:  See the new instructions.   Vitamin D 2000 units tablet Take 2,000 Units by mouth daily after supper.        Acute coronary syndrome (MI, NSTEMI, STEMI, etc) this admission?: No.    Outstanding Labs/Studies   CABG on 07/20/18  Duration of Discharge Encounter   Greater than 30 minutes including physician time.  Signed, Tami Lin Duke, PA 07/16/2018, 1:30 PM   Attending Note:   The patient was seen and examined.  Agree with assessment and plan as noted above.  Changes made to the above note as needed.  Patient seen and independently examined with  Doreene Adas, PA .   We discussed all aspects of the encounter. I agree with the assessment and plan as stated above.  1.   CAD :   Has 3 V cad. Is stable Is coming for CABG on Monday   Stable for DC at this point.  DC to home     I have spent a total of 40 minutes with patient reviewing hospital  notes , telemetry, EKGs, labs and examining patient as well as establishing an assessment and plan that was discussed with the patient. > 50% of time was spent in direct patient care.    Thayer Headings, Brooke Bonito., MD, [facility] 07/16/2018, 1:44 PM [address]  [place] Pager [phone]

## 2018-07-16 NOTE — Progress Notes (Signed)
      [address]       [place] [address]             [phone]       I spoke with Mr. Gribble and family at the bedside about the plan for Monday morning. He is to report to [name] at 5:30am for his pre-hydration. He will be the first case in the OR to start at 7:30am. All questions and concerns addressed. Levonne Spiller our office manager will contact the patient over the weekend to answer any additional questions and provide a detailed plan for the day of surgery.   Nicholes Rough, PA-C

## 2018-07-16 NOTE — Progress Notes (Signed)
CARDIAC REHAB PHASE I   PRE:  Rate/Rhythm: 66 SR  BP:  Supine:   Sitting: 146/63  Standing:    SaO2: 98%RA  MODE:  Ambulation: 500 ft   POST:  Rate/Rhythm: 81 SR  BP:  Supine: 172/60  Sitting:   Standing:    SaO2: 99%RA 0805-0847 Pt walked 500 ft on RA with steady gait and tolerated without CP. Gave pt and wife OHS booklet and wrote down how to view pre op video. Gave care guide and in the tube handout. Pt's wife just completed CR. Discussed with pt that we will refer after surgery to program in [place]. Discussed importance of IS and mobility after surgery. Gave IS and pt able to demonstrate 1250 ml correctly. Discussed sternal precautions and staying in the tube. Wife will be available 24/7 after discharge to assist with care.   Graylon Good, RN BSN  07/16/2018 8:43 AM

## 2018-07-17 ENCOUNTER — Telehealth: Payer: Self-pay | Admitting: Endocrinology

## 2018-07-17 ENCOUNTER — Other Ambulatory Visit: Payer: Self-pay | Admitting: *Deleted

## 2018-07-17 ENCOUNTER — Encounter (HOSPITAL_COMMUNITY): Payer: Self-pay | Admitting: *Deleted

## 2018-07-17 ENCOUNTER — Other Ambulatory Visit: Payer: Self-pay

## 2018-07-17 DIAGNOSIS — I2510 Atherosclerotic heart disease of native coronary artery without angina pectoris: Secondary | ICD-10-CM

## 2018-07-17 NOTE — Progress Notes (Signed)
   07/17/18 1508  OBSTRUCTIVE SLEEP APNEA  Have you ever been diagnosed with sleep apnea through a sleep study? No  Do you snore loudly (loud enough to be heard through closed doors)?  1  Do you often feel tired, fatigued, or sleepy during the daytime (such as falling asleep during driving or talking to someone)? 0  Has anyone observed you stop breathing during your sleep? 0  Do you have, or are you being treated for high blood pressure? 1  BMI more than 35 kg/m2? 0  Age > 50 (1-yes) 1  Neck circumference greater than:Male 16 inches or larger, Male 17inches or larger?  (assess dos)  Male Gender (Yes=1) 1  Obstructive Sleep Apnea Score 4

## 2018-07-17 NOTE — Telephone Encounter (Signed)
Please continue the same pump settings until you get to the hospital Monday, then take the pump off.  They will take care of the blood sugar while you are there.

## 2018-07-17 NOTE — Telephone Encounter (Signed)
Pt has been advised to continue pump at the same settings until he arrives at the hospital. Pt is aware that he will remove pump and allow hospital staff to manage diabetes during his admission. Verbalized acceptance and understanding.

## 2018-07-17 NOTE — Telephone Encounter (Signed)
IN DR Revloc YOU PLEASE ADVISE ON THIS   Patient stated that he is having open heart surgery on Monday They advised him to call our office to see if there is anything that he needs to change to prepare for this surgery.  If there is anything different he should do with his insulin.    Please advise

## 2018-07-17 NOTE — Progress Notes (Signed)
Pt advised to reach out to Endocrinologist, Dr. Dwyane Dee regarding pre-op instructions for pending surgery. Spoke with Tillie Rung, Diabetes Coordinator, to make her aware of pt issues and upcoming surgery; no new orders.

## 2018-07-17 NOTE — Progress Notes (Signed)
Pt denies any acute cardiopulmonary issues. Pt stated that he is under the care of Dr. Percival Spanish, Cardiology. Pt stated that he was instructed to wear his insulin pump in on the DOS but was not provided with any other instructions. Pt stated that he does not know how to decrease basal rate by 20%. Diabetes Educator paged; awaiting a return call. Pt requested that spouse, Mrs. Zirbel, complete pre-op assessment and be given pre-op instructions. Spouse made aware to have pt stop taking vitamins, fish oil, Glucosamine, Cognition Supplement and herbal medications. Do not take any NSAIDs ie: Ibuprofen, Advil, Naproxen (Aleve), Motrin, BC and Goody Powder.Spouse made aware that pt do not take Victoza on DOS. Spouse made aware to have pt check BG every 2 hours prior to arrival to hospital on DOS. Spouse made aware to treat a BG < 70 with 4 ounces of apple  juice, wait 15 minutes after intervention to recheck BG, if BG remains < 70, call Short Stay unit to speak with a nurse. Spouse made aware that if pt BG is > 220 to take half of correction dose. Spouse verbalized understanding of all pre-op instructions.

## 2018-07-19 MED ORDER — VANCOMYCIN HCL 10 G IV SOLR
1500.0000 mg | INTRAVENOUS | Status: AC
  Administered 2018-07-20: 1500 mg via INTRAVENOUS
  Filled 2018-07-19: qty 1500

## 2018-07-19 MED ORDER — TRANEXAMIC ACID 1000 MG/10ML IV SOLN
1.5000 mg/kg/h | INTRAVENOUS | Status: AC
  Administered 2018-07-20: 1.5 mg/kg/h via INTRAVENOUS
  Filled 2018-07-19: qty 25

## 2018-07-19 MED ORDER — MILRINONE LACTATE IN DEXTROSE 20-5 MG/100ML-% IV SOLN
0.3000 ug/kg/min | INTRAVENOUS | Status: DC
  Filled 2018-07-19: qty 100

## 2018-07-19 MED ORDER — NOREPINEPHRINE 4 MG/250ML-% IV SOLN
0.0000 ug/min | INTRAVENOUS | Status: DC
  Filled 2018-07-19: qty 250

## 2018-07-19 MED ORDER — SODIUM CHLORIDE 0.9 % IV SOLN
1.5000 g | INTRAVENOUS | Status: AC
  Administered 2018-07-20: 1.5 g via INTRAVENOUS
  Filled 2018-07-19: qty 1.5

## 2018-07-19 MED ORDER — NITROGLYCERIN IN D5W 200-5 MCG/ML-% IV SOLN
2.0000 ug/min | INTRAVENOUS | Status: AC
  Administered 2018-07-20: 5 ug/min via INTRAVENOUS
  Filled 2018-07-19: qty 250

## 2018-07-19 MED ORDER — POTASSIUM CHLORIDE 2 MEQ/ML IV SOLN
80.0000 meq | INTRAVENOUS | Status: DC
  Filled 2018-07-19: qty 40

## 2018-07-19 MED ORDER — PHENYLEPHRINE HCL-NACL 20-0.9 MG/250ML-% IV SOLN
30.0000 ug/min | INTRAVENOUS | Status: AC
  Administered 2018-07-20: 25 ug/min via INTRAVENOUS
  Filled 2018-07-19: qty 250

## 2018-07-19 MED ORDER — PLASMA-LYTE 148 IV SOLN
INTRAVENOUS | Status: AC
  Administered 2018-07-20: 500 mL
  Filled 2018-07-19: qty 2.5

## 2018-07-19 MED ORDER — SODIUM CHLORIDE 0.9 % IV SOLN
INTRAVENOUS | Status: DC
  Filled 2018-07-19: qty 30

## 2018-07-19 MED ORDER — MAGNESIUM SULFATE 50 % IJ SOLN
40.0000 meq | INTRAMUSCULAR | Status: DC
  Filled 2018-07-19: qty 9.85

## 2018-07-19 MED ORDER — SODIUM CHLORIDE 0.9 % IV SOLN
750.0000 mg | INTRAVENOUS | Status: DC
  Filled 2018-07-19: qty 750

## 2018-07-19 MED ORDER — EPINEPHRINE PF 1 MG/ML IJ SOLN
0.0000 ug/min | INTRAVENOUS | Status: DC
  Filled 2018-07-19: qty 4

## 2018-07-19 MED ORDER — DEXMEDETOMIDINE HCL IN NACL 400 MCG/100ML IV SOLN
0.1000 ug/kg/h | INTRAVENOUS | Status: AC
  Administered 2018-07-20: 0.7 ug/kg/h via INTRAVENOUS
  Filled 2018-07-19: qty 100

## 2018-07-19 MED ORDER — TRANEXAMIC ACID (OHS) PUMP PRIME SOLUTION
2.0000 mg/kg | INTRAVENOUS | Status: DC
  Filled 2018-07-19: qty 1.78

## 2018-07-19 MED ORDER — TRANEXAMIC ACID (OHS) BOLUS VIA INFUSION
15.0000 mg/kg | INTRAVENOUS | Status: AC
  Administered 2018-07-20: 1335 mg via INTRAVENOUS
  Filled 2018-07-19: qty 1335

## 2018-07-19 MED ORDER — INSULIN REGULAR(HUMAN) IN NACL 100-0.9 UT/100ML-% IV SOLN
INTRAVENOUS | Status: AC
  Administered 2018-07-20: 1 [IU]/h via INTRAVENOUS
  Filled 2018-07-19: qty 100

## 2018-07-19 MED ORDER — DOPAMINE-DEXTROSE 3.2-5 MG/ML-% IV SOLN
0.0000 ug/kg/min | INTRAVENOUS | Status: AC
  Administered 2018-07-20: 3 ug/kg/min via INTRAVENOUS
  Filled 2018-07-19: qty 250

## 2018-07-20 ENCOUNTER — Ambulatory Visit (HOSPITAL_COMMUNITY): Payer: Medicare Other

## 2018-07-20 ENCOUNTER — Other Ambulatory Visit: Payer: Self-pay

## 2018-07-20 ENCOUNTER — Inpatient Hospital Stay (HOSPITAL_COMMUNITY): Payer: Medicare Other

## 2018-07-20 ENCOUNTER — Inpatient Hospital Stay (HOSPITAL_COMMUNITY): Payer: Medicare Other | Admitting: Certified Registered Nurse Anesthetist

## 2018-07-20 ENCOUNTER — Encounter (HOSPITAL_COMMUNITY): Payer: Self-pay

## 2018-07-20 ENCOUNTER — Inpatient Hospital Stay (HOSPITAL_COMMUNITY)
Admission: RE | Admit: 2018-07-20 | Discharge: 2018-07-25 | DRG: 236 | Disposition: A | Discharge status: Home / Self Care | Payer: Medicare Other | Source: Ambulatory Visit | Attending: Cardiothoracic Surgery | Admitting: Cardiothoracic Surgery

## 2018-07-20 ENCOUNTER — Inpatient Hospital Stay (HOSPITAL_COMMUNITY)
Admission: RE | Disposition: A | Discharge status: Home / Self Care | Payer: Self-pay | Source: Ambulatory Visit | Attending: Cardiothoracic Surgery

## 2018-07-20 DIAGNOSIS — Z951 Presence of aortocoronary bypass graft: Secondary | ICD-10-CM

## 2018-07-20 DIAGNOSIS — I10 Essential (primary) hypertension: Secondary | ICD-10-CM | POA: Diagnosis not present

## 2018-07-20 DIAGNOSIS — E781 Pure hyperglyceridemia: Secondary | ICD-10-CM | POA: Diagnosis not present

## 2018-07-20 DIAGNOSIS — Z4682 Encounter for fitting and adjustment of non-vascular catheter: Secondary | ICD-10-CM | POA: Diagnosis not present

## 2018-07-20 DIAGNOSIS — Z8551 Personal history of malignant neoplasm of bladder: Secondary | ICD-10-CM

## 2018-07-20 DIAGNOSIS — Z8249 Family history of ischemic heart disease and other diseases of the circulatory system: Secondary | ICD-10-CM | POA: Diagnosis not present

## 2018-07-20 DIAGNOSIS — N183 Chronic kidney disease, stage 3 (moderate): Secondary | ICD-10-CM | POA: Diagnosis present

## 2018-07-20 DIAGNOSIS — Z79899 Other long term (current) drug therapy: Secondary | ICD-10-CM

## 2018-07-20 DIAGNOSIS — Z9641 Presence of insulin pump (external) (internal): Secondary | ICD-10-CM | POA: Diagnosis present

## 2018-07-20 DIAGNOSIS — E1122 Type 2 diabetes mellitus with diabetic chronic kidney disease: Secondary | ICD-10-CM | POA: Diagnosis not present

## 2018-07-20 DIAGNOSIS — D62 Acute posthemorrhagic anemia: Secondary | ICD-10-CM | POA: Diagnosis not present

## 2018-07-20 DIAGNOSIS — I080 Rheumatic disorders of both mitral and aortic valves: Secondary | ICD-10-CM | POA: Diagnosis not present

## 2018-07-20 DIAGNOSIS — Z419 Encounter for procedure for purposes other than remedying health state, unspecified: Secondary | ICD-10-CM

## 2018-07-20 DIAGNOSIS — I2510 Atherosclerotic heart disease of native coronary artery without angina pectoris: Secondary | ICD-10-CM

## 2018-07-20 DIAGNOSIS — Z9689 Presence of other specified functional implants: Secondary | ICD-10-CM

## 2018-07-20 DIAGNOSIS — E8770 Fluid overload, unspecified: Secondary | ICD-10-CM | POA: Diagnosis not present

## 2018-07-20 DIAGNOSIS — M109 Gout, unspecified: Secondary | ICD-10-CM | POA: Diagnosis not present

## 2018-07-20 DIAGNOSIS — I25110 Atherosclerotic heart disease of native coronary artery with unstable angina pectoris: Secondary | ICD-10-CM | POA: Diagnosis not present

## 2018-07-20 DIAGNOSIS — I129 Hypertensive chronic kidney disease with stage 1 through stage 4 chronic kidney disease, or unspecified chronic kidney disease: Secondary | ICD-10-CM | POA: Diagnosis present

## 2018-07-20 DIAGNOSIS — Z87891 Personal history of nicotine dependence: Secondary | ICD-10-CM | POA: Diagnosis not present

## 2018-07-20 DIAGNOSIS — M1990 Unspecified osteoarthritis, unspecified site: Secondary | ICD-10-CM | POA: Diagnosis not present

## 2018-07-20 DIAGNOSIS — E785 Hyperlipidemia, unspecified: Secondary | ICD-10-CM | POA: Diagnosis present

## 2018-07-20 DIAGNOSIS — J9811 Atelectasis: Secondary | ICD-10-CM | POA: Diagnosis not present

## 2018-07-20 DIAGNOSIS — R918 Other nonspecific abnormal finding of lung field: Secondary | ICD-10-CM | POA: Diagnosis not present

## 2018-07-20 DIAGNOSIS — R0989 Other specified symptoms and signs involving the circulatory and respiratory systems: Secondary | ICD-10-CM | POA: Diagnosis not present

## 2018-07-20 DIAGNOSIS — Z09 Encounter for follow-up examination after completed treatment for conditions other than malignant neoplasm: Secondary | ICD-10-CM

## 2018-07-20 DIAGNOSIS — Z7982 Long term (current) use of aspirin: Secondary | ICD-10-CM

## 2018-07-20 HISTORY — PX: TEE WITHOUT CARDIOVERSION: SHX5443

## 2018-07-20 HISTORY — DX: Anxiety disorder, unspecified: F41.9

## 2018-07-20 HISTORY — PX: CORONARY ARTERY BYPASS GRAFT: SHX141

## 2018-07-20 HISTORY — DX: Atherosclerotic heart disease of native coronary artery without angina pectoris: I25.10

## 2018-07-20 LAB — GLUCOSE, CAPILLARY
Glucose-Capillary: 118 mg/dL — ABNORMAL HIGH (ref 70–99)
Glucose-Capillary: 133 mg/dL — ABNORMAL HIGH (ref 70–99)
Glucose-Capillary: 113 mg/dL — ABNORMAL HIGH (ref 70–99)
Glucose-Capillary: 117 mg/dL — ABNORMAL HIGH (ref 70–99)
Glucose-Capillary: 116 mg/dL — ABNORMAL HIGH (ref 70–99)
Glucose-Capillary: 115 mg/dL — ABNORMAL HIGH (ref 70–99)

## 2018-07-20 LAB — COMPREHENSIVE METABOLIC PANEL
ALT: 110 U/L — ABNORMAL HIGH (ref 0–44)
AST: 55 U/L — ABNORMAL HIGH (ref 15–41)
Albumin: 3.6 g/dL (ref 3.5–5.0)
Alkaline Phosphatase: 59 U/L (ref 38–126)
Anion gap: 5 (ref 5–15)
BUN: 20 mg/dL (ref 8–23)
CO2: 24 mmol/L (ref 22–32)
Calcium: 9.1 mg/dL (ref 8.9–10.3)
Chloride: 110 mmol/L (ref 98–111)
Creatinine, Ser: 1.78 mg/dL — ABNORMAL HIGH (ref 0.61–1.24)
GFR calc Af Amer: 43 mL/min — ABNORMAL LOW (ref >60)
GFR calc non Af Amer: 37 mL/min — ABNORMAL LOW (ref >60)
Glucose, Bld: 109 mg/dL — ABNORMAL HIGH (ref 70–99)
Potassium: 4 mmol/L (ref 3.5–5.1)
Sodium: 139 mmol/L (ref 135–145)
Total Bilirubin: 1 mg/dL (ref 0.3–1.2)
Total Protein: 6.5 g/dL (ref 6.5–8.1)

## 2018-07-20 LAB — POCT I-STAT, CHEM 8
BUN: 21 mg/dL (ref 8–23)
BUN: 21 mg/dL (ref 8–23)
BUN: 18 mg/dL (ref 8–23)
BUN: 20 mg/dL (ref 8–23)
BUN: 20 mg/dL (ref 8–23)
BUN: 20 mg/dL (ref 8–23)
BUN: 22 mg/dL (ref 8–23)
Calcium, Ion: 1.27 mmol/L (ref 1.15–1.40)
Calcium, Ion: 1.27 mmol/L (ref 1.15–1.40)
Calcium, Ion: 1.07 mmol/L — ABNORMAL LOW (ref 1.15–1.40)
Calcium, Ion: 1.25 mmol/L (ref 1.15–1.40)
Calcium, Ion: 1.14 mmol/L — ABNORMAL LOW (ref 1.15–1.40)
Calcium, Ion: 1.15 mmol/L (ref 1.15–1.40)
Calcium, Ion: 1.2 mmol/L (ref 1.15–1.40)
Chloride: 106 mmol/L (ref 98–111)
Chloride: 107 mmol/L (ref 98–111)
Chloride: 104 mmol/L (ref 98–111)
Chloride: 107 mmol/L (ref 98–111)
Chloride: 105 mmol/L (ref 98–111)
Chloride: 106 mmol/L (ref 98–111)
Chloride: 107 mmol/L (ref 98–111)
Creatinine, Ser: 1.6 mg/dL — ABNORMAL HIGH (ref 0.61–1.24)
Creatinine, Ser: 1.6 mg/dL — ABNORMAL HIGH (ref 0.61–1.24)
Creatinine, Ser: 1.4 mg/dL — ABNORMAL HIGH (ref 0.61–1.24)
Creatinine, Ser: 1.6 mg/dL — ABNORMAL HIGH (ref 0.61–1.24)
Creatinine, Ser: 1.4 mg/dL — ABNORMAL HIGH (ref 0.61–1.24)
Creatinine, Ser: 1.6 mg/dL — ABNORMAL HIGH (ref 0.61–1.24)
Creatinine, Ser: 1.6 mg/dL — ABNORMAL HIGH (ref 0.61–1.24)
Glucose, Bld: 112 mg/dL — ABNORMAL HIGH (ref 70–99)
Glucose, Bld: 111 mg/dL — ABNORMAL HIGH (ref 70–99)
Glucose, Bld: 100 mg/dL — ABNORMAL HIGH (ref 70–99)
Glucose, Bld: 108 mg/dL — ABNORMAL HIGH (ref 70–99)
Glucose, Bld: 163 mg/dL — ABNORMAL HIGH (ref 70–99)
Glucose, Bld: 161 mg/dL — ABNORMAL HIGH (ref 70–99)
Glucose, Bld: 144 mg/dL — ABNORMAL HIGH (ref 70–99)
HCT: 30 % — ABNORMAL LOW (ref 39.0–52.0)
HCT: 28 % — ABNORMAL LOW (ref 39.0–52.0)
HCT: 23 % — ABNORMAL LOW (ref 39.0–52.0)
HCT: 26 % — ABNORMAL LOW (ref 39.0–52.0)
HCT: 26 % — ABNORMAL LOW (ref 39.0–52.0)
HCT: 26 % — ABNORMAL LOW (ref 39.0–52.0)
HCT: 26 % — ABNORMAL LOW (ref 39.0–52.0)
Hemoglobin: 10.2 g/dL — ABNORMAL LOW (ref 13.0–17.0)
Hemoglobin: 9.5 g/dL — ABNORMAL LOW (ref 13.0–17.0)
Hemoglobin: 7.8 g/dL — ABNORMAL LOW (ref 13.0–17.0)
Hemoglobin: 8.8 g/dL — ABNORMAL LOW (ref 13.0–17.0)
Hemoglobin: 8.8 g/dL — ABNORMAL LOW (ref 13.0–17.0)
Hemoglobin: 8.8 g/dL — ABNORMAL LOW (ref 13.0–17.0)
Hemoglobin: 8.8 g/dL — ABNORMAL LOW (ref 13.0–17.0)
Potassium: 3.9 mmol/L (ref 3.5–5.1)
Potassium: 4.1 mmol/L (ref 3.5–5.1)
Potassium: 4.1 mmol/L (ref 3.5–5.1)
Potassium: 4.2 mmol/L (ref 3.5–5.1)
Potassium: 4.8 mmol/L (ref 3.5–5.1)
Potassium: 4.2 mmol/L (ref 3.5–5.1)
Potassium: 4.3 mmol/L (ref 3.5–5.1)
Sodium: 141 mmol/L (ref 135–145)
Sodium: 140 mmol/L (ref 135–145)
Sodium: 139 mmol/L (ref 135–145)
Sodium: 140 mmol/L (ref 135–145)
Sodium: 139 mmol/L (ref 135–145)
Sodium: 141 mmol/L (ref 135–145)
Sodium: 141 mmol/L (ref 135–145)
TCO2: 26 mmol/L (ref 22–32)
TCO2: 25 mmol/L (ref 22–32)
TCO2: 24 mmol/L (ref 22–32)
TCO2: 27 mmol/L (ref 22–32)
TCO2: 26 mmol/L (ref 22–32)
TCO2: 24 mmol/L (ref 22–32)
TCO2: 23 mmol/L (ref 22–32)

## 2018-07-20 LAB — POCT I-STAT 3, ART BLOOD GAS (G3+)
Acid-base deficit: 2 mmol/L (ref 0.0–2.0)
Acid-base deficit: 4 mmol/L — ABNORMAL HIGH (ref 0.0–2.0)
Acid-base deficit: 4 mmol/L — ABNORMAL HIGH (ref 0.0–2.0)
Acid-base deficit: 4 mmol/L — ABNORMAL HIGH (ref 0.0–2.0)
Bicarbonate: 24.8 mmol/L (ref 20.0–28.0)
Bicarbonate: 22.9 mmol/L (ref 20.0–28.0)
Bicarbonate: 21.2 mmol/L (ref 20.0–28.0)
Bicarbonate: 21.9 mmol/L (ref 20.0–28.0)
Bicarbonate: 21.6 mmol/L (ref 20.0–28.0)
O2 Saturation: 100 %
O2 Saturation: 100 %
O2 Saturation: 96 %
O2 Saturation: 97 %
O2 Saturation: 95 %
Patient temperature: 35.6
Patient temperature: 36
Patient temperature: 36.2
TCO2: 26 mmol/L (ref 22–32)
TCO2: 24 mmol/L (ref 22–32)
TCO2: 22 mmol/L (ref 22–32)
TCO2: 23 mmol/L (ref 22–32)
TCO2: 23 mmol/L (ref 22–32)
pCO2 arterial: 41.6 mmHg (ref 32.0–48.0)
pCO2 arterial: 37.4 mmHg (ref 32.0–48.0)
pCO2 arterial: 36.4 mmHg (ref 32.0–48.0)
pCO2 arterial: 39.5 mmHg (ref 32.0–48.0)
pCO2 arterial: 38.3 mmHg (ref 32.0–48.0)
pH, Arterial: 7.383 (ref 7.350–7.450)
pH, Arterial: 7.395 (ref 7.350–7.450)
pH, Arterial: 7.367 (ref 7.350–7.450)
pH, Arterial: 7.348 — ABNORMAL LOW (ref 7.350–7.450)
pH, Arterial: 7.355 (ref 7.350–7.450)
pO2, Arterial: 424 mmHg — ABNORMAL HIGH (ref 83.0–108.0)
pO2, Arterial: 227 mmHg — ABNORMAL HIGH (ref 83.0–108.0)
pO2, Arterial: 76 mmHg — ABNORMAL LOW (ref 83.0–108.0)
pO2, Arterial: 91 mmHg (ref 83.0–108.0)
pO2, Arterial: 73 mmHg — ABNORMAL LOW (ref 83.0–108.0)

## 2018-07-20 LAB — CBC
HCT: 34.8 % — ABNORMAL LOW (ref 39.0–52.0)
HCT: 28.5 % — ABNORMAL LOW (ref 39.0–52.0)
HCT: 28.7 % — ABNORMAL LOW (ref 39.0–52.0)
Hemoglobin: 12 g/dL — ABNORMAL LOW (ref 13.0–17.0)
Hemoglobin: 9.7 g/dL — ABNORMAL LOW (ref 13.0–17.0)
Hemoglobin: 9.9 g/dL — ABNORMAL LOW (ref 13.0–17.0)
MCH: 32.7 pg (ref 26.0–34.0)
MCH: 32.1 pg (ref 26.0–34.0)
MCH: 32.1 pg (ref 26.0–34.0)
MCHC: 34.5 g/dL (ref 30.0–36.0)
MCHC: 34 g/dL (ref 30.0–36.0)
MCHC: 34.5 g/dL (ref 30.0–36.0)
MCV: 94.8 fL (ref 80.0–100.0)
MCV: 94.4 fL (ref 80.0–100.0)
MCV: 93.2 fL (ref 80.0–100.0)
Platelets: 165 10*3/uL (ref 150–400)
Platelets: 114 10*3/uL — ABNORMAL LOW (ref 150–400)
Platelets: 120 10*3/uL — ABNORMAL LOW (ref 150–400)
RBC: 3.67 MIL/uL — ABNORMAL LOW (ref 4.22–5.81)
RBC: 3.02 MIL/uL — ABNORMAL LOW (ref 4.22–5.81)
RBC: 3.08 MIL/uL — ABNORMAL LOW (ref 4.22–5.81)
RDW: 12.8 % (ref 11.5–15.5)
RDW: 12.6 % (ref 11.5–15.5)
RDW: 12.6 % (ref 11.5–15.5)
WBC: 3.9 10*3/uL — ABNORMAL LOW (ref 4.0–10.5)
WBC: 6.1 10*3/uL (ref 4.0–10.5)
WBC: 8 10*3/uL (ref 4.0–10.5)
nRBC: 0 % (ref 0.0–0.2)
nRBC: 0 % (ref 0.0–0.2)
nRBC: 0 % (ref 0.0–0.2)

## 2018-07-20 LAB — URINALYSIS, ROUTINE W REFLEX MICROSCOPIC
Bilirubin Urine: NEGATIVE
Glucose, UA: NEGATIVE mg/dL
Hgb urine dipstick: NEGATIVE
Ketones, ur: NEGATIVE mg/dL
Leukocytes, UA: NEGATIVE
Nitrite: NEGATIVE
Protein, ur: NEGATIVE mg/dL
Specific Gravity, Urine: 1.018 (ref 1.005–1.030)
pH: 5 (ref 5.0–8.0)

## 2018-07-20 LAB — SURGICAL PCR SCREEN
MRSA, PCR: NEGATIVE
Staphylococcus aureus: POSITIVE — AB

## 2018-07-20 LAB — FIBRINOGEN: Fibrinogen: 253 mg/dL (ref 210–475)

## 2018-07-20 LAB — BLOOD GAS, ARTERIAL
Acid-base deficit: 0.9 mmol/L (ref 0.0–2.0)
Bicarbonate: 23.5 mmol/L (ref 20.0–28.0)
Drawn by: 449841
FIO2: 21
O2 Saturation: 99.2 %
Patient temperature: 98.6
pCO2 arterial: 41 mmHg (ref 32.0–48.0)
pH, Arterial: 7.378 (ref 7.350–7.450)
pO2, Arterial: 166 mmHg — ABNORMAL HIGH (ref 83.0–108.0)

## 2018-07-20 LAB — PROTIME-INR
INR: 1.11
INR: 1.62
Prothrombin Time: 14.2 seconds (ref 11.4–15.2)
Prothrombin Time: 19 seconds — ABNORMAL HIGH (ref 11.4–15.2)

## 2018-07-20 LAB — HEMOGLOBIN AND HEMATOCRIT, BLOOD
HCT: 26.9 % — ABNORMAL LOW (ref 39.0–52.0)
Hemoglobin: 9.3 g/dL — ABNORMAL LOW (ref 13.0–17.0)

## 2018-07-20 LAB — POCT I-STAT 4, (NA,K, GLUC, HGB,HCT)
Glucose, Bld: 138 mg/dL — ABNORMAL HIGH (ref 70–99)
HCT: 27 % — ABNORMAL LOW (ref 39.0–52.0)
Hemoglobin: 9.2 g/dL — ABNORMAL LOW (ref 13.0–17.0)
Potassium: 3.7 mmol/L (ref 3.5–5.1)
Sodium: 142 mmol/L (ref 135–145)

## 2018-07-20 LAB — APTT
aPTT: 32 seconds (ref 24–36)
aPTT: 34 seconds (ref 24–36)

## 2018-07-20 LAB — CREATININE, SERUM
Creatinine, Ser: 1.58 mg/dL — ABNORMAL HIGH (ref 0.61–1.24)
GFR calc Af Amer: 49 mL/min — ABNORMAL LOW (ref >60)
GFR calc non Af Amer: 43 mL/min — ABNORMAL LOW (ref >60)

## 2018-07-20 LAB — ABO/RH: ABO/RH(D): O POS

## 2018-07-20 LAB — PLATELET COUNT: Platelets: 172 10*3/uL (ref 150–400)

## 2018-07-20 LAB — MAGNESIUM: Magnesium: 2.1 mg/dL (ref 1.7–2.4)

## 2018-07-20 SURGERY — CORONARY ARTERY BYPASS GRAFTING (CABG)
Anesthesia: General | Site: Chest

## 2018-07-20 MED ORDER — HEPARIN SODIUM (PORCINE) 1000 UNIT/ML IJ SOLN
INTRAMUSCULAR | Status: AC
  Filled 2018-07-20: qty 1

## 2018-07-20 MED ORDER — SODIUM CHLORIDE 0.9 % IV SOLN
INTRAVENOUS | Status: DC
  Administered 2018-07-20: 13:00:00 via INTRAVENOUS

## 2018-07-20 MED ORDER — METOPROLOL TARTRATE 12.5 MG HALF TABLET
12.5000 mg | ORAL_TABLET | Freq: Two times a day (BID) | ORAL | Status: DC
  Administered 2018-07-21 – 2018-07-22 (×3): 12.5 mg via ORAL
  Filled 2018-07-20 (×3): qty 1

## 2018-07-20 MED ORDER — METOPROLOL TARTRATE 25 MG/10 ML ORAL SUSPENSION: 12.5000 mg | Freq: Two times a day (BID) | ORAL | Status: DC

## 2018-07-20 MED ORDER — BISACODYL 10 MG RE SUPP: 10.0000 mg | Freq: Every day | RECTAL | Status: DC

## 2018-07-20 MED ORDER — ORAL CARE MOUTH RINSE
15.0000 mL | OROMUCOSAL | Status: DC
  Administered 2018-07-20: 15 mL via OROMUCOSAL

## 2018-07-20 MED ORDER — SODIUM CHLORIDE 0.9 % IJ SOLN
INTRAMUSCULAR | Status: AC
  Filled 2018-07-20: qty 10

## 2018-07-20 MED ORDER — CHLORHEXIDINE GLUCONATE 4 % EX LIQD: 30.0000 mL | CUTANEOUS | Status: DC

## 2018-07-20 MED ORDER — ASPIRIN EC 325 MG PO TBEC
325.0000 mg | DELAYED_RELEASE_TABLET | Freq: Every day | ORAL | Status: DC
  Administered 2018-07-21 – 2018-07-23 (×3): 325 mg via ORAL
  Filled 2018-07-20 (×3): qty 1

## 2018-07-20 MED ORDER — DOCUSATE SODIUM 100 MG PO CAPS
200.0000 mg | ORAL_CAPSULE | Freq: Every day | ORAL | Status: DC
  Administered 2018-07-21 – 2018-07-23 (×3): 200 mg via ORAL
  Filled 2018-07-20 (×3): qty 2

## 2018-07-20 MED ORDER — HEMOSTATIC AGENTS (NO CHARGE) OPTIME
TOPICAL | Status: DC | PRN
  Administered 2018-07-20: 3 via TOPICAL

## 2018-07-20 MED ORDER — ARTIFICIAL TEARS OPHTHALMIC OINT
TOPICAL_OINTMENT | OPHTHALMIC | Status: AC
  Filled 2018-07-20: qty 3.5

## 2018-07-20 MED ORDER — OXYCODONE HCL 5 MG PO TABS
5.0000 mg | ORAL_TABLET | ORAL | Status: DC | PRN
  Administered 2018-07-21 (×2): 10 mg via ORAL
  Administered 2018-07-21: 5 mg via ORAL
  Filled 2018-07-20: qty 1
  Filled 2018-07-20 (×2): qty 2

## 2018-07-20 MED ORDER — POTASSIUM CHLORIDE 10 MEQ/50ML IV SOLN
10.0000 meq | INTRAVENOUS | Status: AC
  Administered 2018-07-20: 10 meq via INTRAVENOUS

## 2018-07-20 MED ORDER — ASPIRIN 81 MG PO CHEW: 324.0000 mg | CHEWABLE_TABLET | Freq: Every day | ORAL | Status: DC

## 2018-07-20 MED ORDER — FENTANYL CITRATE (PF) 250 MCG/5ML IJ SOLN
INTRAMUSCULAR | Status: DC | PRN
  Administered 2018-07-20: 50 ug via INTRAVENOUS
  Administered 2018-07-20: 100 ug via INTRAVENOUS
  Administered 2018-07-20 (×2): 50 ug via INTRAVENOUS
  Administered 2018-07-20: 100 ug via INTRAVENOUS
  Administered 2018-07-20: 50 ug via INTRAVENOUS
  Administered 2018-07-20: 100 ug via INTRAVENOUS
  Administered 2018-07-20: 250 ug via INTRAVENOUS
  Administered 2018-07-20: 100 ug via INTRAVENOUS
  Administered 2018-07-20: 50 ug via INTRAVENOUS
  Administered 2018-07-20 (×2): 100 ug via INTRAVENOUS
  Administered 2018-07-20: 50 ug via INTRAVENOUS

## 2018-07-20 MED ORDER — PROPOFOL 10 MG/ML IV BOLUS
INTRAVENOUS | Status: DC | PRN
  Administered 2018-07-20: 100 mg via INTRAVENOUS

## 2018-07-20 MED ORDER — MIDAZOLAM HCL 5 MG/5ML IJ SOLN
INTRAMUSCULAR | Status: DC | PRN
  Administered 2018-07-20: 0.5 mg via INTRAVENOUS
  Administered 2018-07-20 (×2): 2 mg via INTRAVENOUS
  Administered 2018-07-20 (×2): 1 mg via INTRAVENOUS
  Administered 2018-07-20: 0.5 mg via INTRAVENOUS
  Administered 2018-07-20: 1 mg via INTRAVENOUS
  Administered 2018-07-20: 2 mg via INTRAVENOUS

## 2018-07-20 MED ORDER — SODIUM CHLORIDE 0.9 % IV SOLN
INTRAVENOUS | Status: DC | PRN
  Administered 2018-07-20: 750 mg via INTRAVENOUS

## 2018-07-20 MED ORDER — METOPROLOL TARTRATE 12.5 MG HALF TABLET
12.5000 mg | ORAL_TABLET | Freq: Once | ORAL | Status: AC
  Administered 2018-07-20: 12.5 mg via ORAL

## 2018-07-20 MED ORDER — BISACODYL 5 MG PO TBEC
10.0000 mg | DELAYED_RELEASE_TABLET | Freq: Every day | ORAL | Status: DC
  Administered 2018-07-21 – 2018-07-23 (×3): 10 mg via ORAL
  Filled 2018-07-20 (×3): qty 2

## 2018-07-20 MED ORDER — MAGNESIUM SULFATE 4 GM/100ML IV SOLN
INTRAVENOUS | Status: AC
  Filled 2018-07-20: qty 100

## 2018-07-20 MED ORDER — PHENYLEPHRINE HCL-NACL 20-0.9 MG/250ML-% IV SOLN
0.0000 ug/min | INTRAVENOUS | Status: DC
  Administered 2018-07-21: 5 ug/min via INTRAVENOUS
  Filled 2018-07-20: qty 250

## 2018-07-20 MED ORDER — MIDAZOLAM HCL 2 MG/2ML IJ SOLN
2.0000 mg | INTRAMUSCULAR | Status: DC | PRN
  Filled 2018-07-20 (×2): qty 2

## 2018-07-20 MED ORDER — ACETAMINOPHEN 160 MG/5ML PO SOLN
650.0000 mg | Freq: Once | ORAL | Status: AC
  Administered 2018-07-20: 650 mg

## 2018-07-20 MED ORDER — METOPROLOL TARTRATE 12.5 MG HALF TABLET
ORAL_TABLET | ORAL | Status: AC
  Administered 2018-07-20: 12.5 mg via ORAL
  Filled 2018-07-20: qty 1

## 2018-07-20 MED ORDER — 0.9 % SODIUM CHLORIDE (POUR BTL) OPTIME
TOPICAL | Status: DC | PRN
  Administered 2018-07-20: 5000 mL

## 2018-07-20 MED ORDER — DOPAMINE-DEXTROSE 3.2-5 MG/ML-% IV SOLN
3.0000 ug/kg/min | INTRAVENOUS | Status: DC
  Filled 2018-07-20: qty 250

## 2018-07-20 MED ORDER — PANTOPRAZOLE SODIUM 40 MG PO TBEC
40.0000 mg | DELAYED_RELEASE_TABLET | Freq: Every day | ORAL | Status: DC
  Administered 2018-07-22 – 2018-07-23 (×2): 40 mg via ORAL
  Filled 2018-07-20 (×2): qty 1

## 2018-07-20 MED ORDER — NITROGLYCERIN IN D5W 200-5 MCG/ML-% IV SOLN: 0.0000 ug/min | INTRAVENOUS | Status: DC

## 2018-07-20 MED ORDER — ROCURONIUM BROMIDE 50 MG/5ML IV SOSY
PREFILLED_SYRINGE | INTRAVENOUS | Status: AC
  Filled 2018-07-20: qty 10

## 2018-07-20 MED ORDER — LACTATED RINGERS IV SOLN: INTRAVENOUS | Status: DC

## 2018-07-20 MED ORDER — ALBUMIN HUMAN 5 % IV SOLN
INTRAVENOUS | Status: DC | PRN
  Administered 2018-07-20 (×2): via INTRAVENOUS

## 2018-07-20 MED ORDER — DEXMEDETOMIDINE HCL IN NACL 400 MCG/100ML IV SOLN: 0.0000 ug/kg/h | INTRAVENOUS | Status: DC

## 2018-07-20 MED ORDER — CHLORHEXIDINE GLUCONATE 0.12 % MT SOLN
15.0000 mL | OROMUCOSAL | Status: AC
  Administered 2018-07-20: 15 mL via OROMUCOSAL

## 2018-07-20 MED ORDER — PROTAMINE SULFATE 10 MG/ML IV SOLN
INTRAVENOUS | Status: AC
  Filled 2018-07-20: qty 5

## 2018-07-20 MED ORDER — PROPOFOL 10 MG/ML IV BOLUS
INTRAVENOUS | Status: AC
  Filled 2018-07-20: qty 20

## 2018-07-20 MED ORDER — LIDOCAINE 2% (20 MG/ML) 5 ML SYRINGE
INTRAMUSCULAR | Status: AC
  Filled 2018-07-20: qty 5

## 2018-07-20 MED ORDER — LACTATED RINGERS IV SOLN
INTRAVENOUS | Status: DC | PRN
  Administered 2018-07-20: 07:00:00 via INTRAVENOUS

## 2018-07-20 MED ORDER — INSULIN REGULAR BOLUS VIA INFUSION
0.0000 [IU] | Freq: Three times a day (TID) | INTRAVENOUS | Status: DC
  Filled 2018-07-20: qty 10

## 2018-07-20 MED ORDER — ATORVASTATIN CALCIUM 20 MG PO TABS: 20.0000 mg | ORAL_TABLET | Freq: Every day | ORAL | Status: DC

## 2018-07-20 MED ORDER — LACTATED RINGERS IV SOLN
INTRAVENOUS | Status: DC | PRN
  Administered 2018-07-20 (×2): via INTRAVENOUS

## 2018-07-20 MED ORDER — CHLORHEXIDINE GLUCONATE 0.12 % MT SOLN
OROMUCOSAL | Status: AC
  Administered 2018-07-20: 15 mL via OROMUCOSAL
  Filled 2018-07-20: qty 15

## 2018-07-20 MED ORDER — INSULIN REGULAR(HUMAN) IN NACL 100-0.9 UT/100ML-% IV SOLN
INTRAVENOUS | Status: DC
  Administered 2018-07-21: 100 [IU] via INTRAVENOUS

## 2018-07-20 MED ORDER — VANCOMYCIN HCL IN DEXTROSE 1-5 GM/200ML-% IV SOLN
1000.0000 mg | Freq: Once | INTRAVENOUS | Status: AC
  Administered 2018-07-20: 1000 mg via INTRAVENOUS

## 2018-07-20 MED ORDER — SODIUM CHLORIDE 0.9% FLUSH
3.0000 mL | Freq: Two times a day (BID) | INTRAVENOUS | Status: DC
  Administered 2018-07-21 – 2018-07-23 (×5): 3 mL via INTRAVENOUS

## 2018-07-20 MED ORDER — ACETAMINOPHEN 500 MG PO TABS
1000.0000 mg | ORAL_TABLET | Freq: Four times a day (QID) | ORAL | Status: DC
  Administered 2018-07-21 – 2018-07-23 (×11): 1000 mg via ORAL
  Filled 2018-07-20 (×11): qty 2

## 2018-07-20 MED ORDER — CHLORHEXIDINE GLUCONATE 0.12% ORAL RINSE (MEDLINE KIT): 15.0000 mL | Freq: Two times a day (BID) | OROMUCOSAL | Status: DC

## 2018-07-20 MED ORDER — ROCURONIUM BROMIDE 10 MG/ML (PF) SYRINGE
PREFILLED_SYRINGE | INTRAVENOUS | Status: DC | PRN
  Administered 2018-07-20 (×3): 50 mg via INTRAVENOUS
  Administered 2018-07-20: 30 mg via INTRAVENOUS

## 2018-07-20 MED ORDER — ACETAMINOPHEN 650 MG RE SUPP: 650.0000 mg | Freq: Once | RECTAL | Status: AC

## 2018-07-20 MED ORDER — PROTAMINE SULFATE 10 MG/ML IV SOLN
INTRAVENOUS | Status: DC | PRN
  Administered 2018-07-20: 300 mg via INTRAVENOUS

## 2018-07-20 MED ORDER — LACTATED RINGERS IV SOLN: 500.0000 mL | Freq: Once | INTRAVENOUS | Status: DC | PRN

## 2018-07-20 MED ORDER — MORPHINE SULFATE (PF) 2 MG/ML IV SOLN: 1.0000 mg | INTRAVENOUS | Status: AC | PRN

## 2018-07-20 MED ORDER — METOPROLOL TARTRATE 5 MG/5ML IV SOLN: 2.5000 mg | INTRAVENOUS | Status: DC | PRN

## 2018-07-20 MED ORDER — FOLIC ACID 1 MG PO TABS
1.0000 mg | ORAL_TABLET | Freq: Every day | ORAL | Status: DC
  Administered 2018-07-21 – 2018-07-24 (×4): 1 mg via ORAL
  Filled 2018-07-20 (×4): qty 1

## 2018-07-20 MED ORDER — SODIUM CHLORIDE 0.9 % IV SOLN: 250.0000 mL | INTRAVENOUS | Status: DC

## 2018-07-20 MED ORDER — ACETAMINOPHEN 160 MG/5ML PO SOLN: 1000.0000 mg | Freq: Four times a day (QID) | ORAL | Status: DC

## 2018-07-20 MED ORDER — PHENYLEPHRINE 40 MCG/ML (10ML) SYRINGE FOR IV PUSH (FOR BLOOD PRESSURE SUPPORT)
PREFILLED_SYRINGE | INTRAVENOUS | Status: AC
  Filled 2018-07-20: qty 10

## 2018-07-20 MED ORDER — ORAL CARE MOUTH RINSE
15.0000 mL | Freq: Two times a day (BID) | OROMUCOSAL | Status: DC
  Administered 2018-07-21 – 2018-07-25 (×5): 15 mL via OROMUCOSAL

## 2018-07-20 MED ORDER — FAMOTIDINE IN NACL 20-0.9 MG/50ML-% IV SOLN
20.0000 mg | Freq: Two times a day (BID) | INTRAVENOUS | Status: DC
  Administered 2018-07-20: 20 mg via INTRAVENOUS

## 2018-07-20 MED ORDER — MUPIROCIN 2 % EX OINT
1.0000 "application " | TOPICAL_OINTMENT | Freq: Once | CUTANEOUS | Status: AC
  Administered 2018-07-20: 1 via TOPICAL

## 2018-07-20 MED ORDER — BUPROPION HCL ER (XL) 150 MG PO TB24
150.0000 mg | ORAL_TABLET | Freq: Every day | ORAL | Status: DC
  Administered 2018-07-21 – 2018-07-25 (×5): 150 mg via ORAL
  Filled 2018-07-20 (×5): qty 1

## 2018-07-20 MED ORDER — PROTAMINE SULFATE 10 MG/ML IV SOLN
INTRAVENOUS | Status: AC
  Filled 2018-07-20: qty 25

## 2018-07-20 MED ORDER — MIDAZOLAM HCL 10 MG/2ML IJ SOLN
INTRAMUSCULAR | Status: AC
  Filled 2018-07-20: qty 2

## 2018-07-20 MED ORDER — ALBUMIN HUMAN 5 % IV SOLN
250.0000 mL | INTRAVENOUS | Status: AC | PRN
  Administered 2018-07-20: 12.5 g via INTRAVENOUS

## 2018-07-20 MED ORDER — HEPARIN SODIUM (PORCINE) 1000 UNIT/ML IJ SOLN
INTRAMUSCULAR | Status: DC | PRN
  Administered 2018-07-20: 30000 [IU] via INTRAVENOUS

## 2018-07-20 MED ORDER — CHLORHEXIDINE GLUCONATE 0.12 % MT SOLN
15.0000 mL | Freq: Once | OROMUCOSAL | Status: AC
  Administered 2018-07-20: 15 mL via OROMUCOSAL

## 2018-07-20 MED ORDER — MUPIROCIN 2 % EX OINT
TOPICAL_OINTMENT | CUTANEOUS | Status: AC
  Administered 2018-07-20: 1 via TOPICAL
  Filled 2018-07-20: qty 22

## 2018-07-20 MED ORDER — SODIUM CHLORIDE 0.9 % IV SOLN
1.5000 g | Freq: Two times a day (BID) | INTRAVENOUS | Status: AC
  Administered 2018-07-20 – 2018-07-22 (×4): 1.5 g via INTRAVENOUS
  Filled 2018-07-20 (×4): qty 1.5

## 2018-07-20 MED ORDER — MORPHINE SULFATE (PF) 2 MG/ML IV SOLN
2.0000 mg | INTRAVENOUS | Status: DC | PRN
  Administered 2018-07-20 – 2018-07-21 (×4): 2 mg via INTRAVENOUS
  Filled 2018-07-20 (×4): qty 1

## 2018-07-20 MED ORDER — SODIUM CHLORIDE 0.45 % IV SOLN: INTRAVENOUS | Status: DC | PRN

## 2018-07-20 MED ORDER — FENTANYL CITRATE (PF) 250 MCG/5ML IJ SOLN
INTRAMUSCULAR | Status: AC
  Filled 2018-07-20: qty 25

## 2018-07-20 MED ORDER — ROCURONIUM BROMIDE 50 MG/5ML IV SOSY
PREFILLED_SYRINGE | INTRAVENOUS | Status: AC
  Filled 2018-07-20: qty 5

## 2018-07-20 MED ORDER — ONDANSETRON HCL 4 MG/2ML IJ SOLN
4.0000 mg | Freq: Four times a day (QID) | INTRAMUSCULAR | Status: DC | PRN
  Administered 2018-07-20 – 2018-07-21 (×3): 4 mg via INTRAVENOUS
  Filled 2018-07-20 (×3): qty 2

## 2018-07-20 MED ORDER — TRAMADOL HCL 50 MG PO TABS: 50.0000 mg | ORAL_TABLET | ORAL | Status: DC | PRN

## 2018-07-20 MED ORDER — MAGNESIUM SULFATE 4 GM/100ML IV SOLN
4.0000 g | Freq: Once | INTRAVENOUS | Status: AC
  Administered 2018-07-20: 4 g via INTRAVENOUS

## 2018-07-20 MED ORDER — SODIUM CHLORIDE 0.9% FLUSH: 3.0000 mL | INTRAVENOUS | Status: DC | PRN

## 2018-07-20 SURGICAL SUPPLY — 66 items
BAG DECANTER FOR FLEXI CONT (MISCELLANEOUS) ×3 IMPLANT
BANDAGE ACE 4X5 VEL STRL LF (GAUZE/BANDAGES/DRESSINGS) ×3 IMPLANT
BANDAGE ACE 6X5 VEL STRL LF (GAUZE/BANDAGES/DRESSINGS) ×3 IMPLANT
BANDAGE ELASTIC 4 VELCRO ST LF (GAUZE/BANDAGES/DRESSINGS) ×1 IMPLANT
BANDAGE ELASTIC 6 VELCRO ST LF (GAUZE/BANDAGES/DRESSINGS) ×1 IMPLANT
BLADE STERNUM SYSTEM 6 (BLADE) ×3 IMPLANT
BNDG GAUZE ELAST 4 BULKY (GAUZE/BANDAGES/DRESSINGS) ×3 IMPLANT
CANISTER SUCT 3000ML PPV (MISCELLANEOUS) ×3 IMPLANT
CATH CPB KIT GERHARDT (MISCELLANEOUS) ×3 IMPLANT
CATH THORACIC 28FR (CATHETERS) ×3 IMPLANT
COVER WAND RF STERILE (DRAPES) ×3 IMPLANT
CRADLE DONUT ADULT HEAD (MISCELLANEOUS) ×3 IMPLANT
DRAIN CHANNEL 28F RND 3/8 FF (WOUND CARE) ×3 IMPLANT
DRAPE CARDIOVASCULAR INCISE (DRAPES) ×3
DRAPE SLUSH/WARMER DISC (DRAPES) ×3 IMPLANT
DRAPE SRG 135X102X78XABS (DRAPES) ×2 IMPLANT
DRSG AQUACEL AG ADV 3.5X14 (GAUZE/BANDAGES/DRESSINGS) ×3 IMPLANT
ELECT BLADE 4.0 EZ CLEAN MEGAD (MISCELLANEOUS) ×3
ELECT REM PT RETURN 9FT ADLT (ELECTROSURGICAL) ×6
ELECTRODE BLDE 4.0 EZ CLN MEGD (MISCELLANEOUS) ×2 IMPLANT
ELECTRODE REM PT RTRN 9FT ADLT (ELECTROSURGICAL) ×4 IMPLANT
FELT TEFLON 1X6 (MISCELLANEOUS) ×6 IMPLANT
GAUZE SPONGE 4X4 12PLY STRL (GAUZE/BANDAGES/DRESSINGS) ×6 IMPLANT
GAUZE SPONGE 4X4 12PLY STRL LF (GAUZE/BANDAGES/DRESSINGS) ×1 IMPLANT
GLOVE BIO SURGEON STRL SZ 6.5 (GLOVE) ×9 IMPLANT
GOWN STRL REUS W/ TWL LRG LVL3 (GOWN DISPOSABLE) ×8 IMPLANT
GOWN STRL REUS W/TWL LRG LVL3 (GOWN DISPOSABLE) ×12
HEMOSTAT POWDER SURGIFOAM 1G (HEMOSTASIS) ×9 IMPLANT
HEMOSTAT SURGICEL 2X14 (HEMOSTASIS) ×3 IMPLANT
KIT BASIN OR (CUSTOM PROCEDURE TRAY) ×3 IMPLANT
KIT CATH SUCT 8FR (CATHETERS) ×3 IMPLANT
KIT SUCTION CATH 14FR (SUCTIONS) ×6 IMPLANT
KIT TURNOVER KIT B (KITS) ×3 IMPLANT
KIT VASOVIEW HEMOPRO VH 3000 (KITS) ×3 IMPLANT
LEAD PACING MYOCARDI (MISCELLANEOUS) ×3 IMPLANT
MARKER GRAFT CORONARY BYPASS (MISCELLANEOUS) ×9 IMPLANT
NS IRRIG 1000ML POUR BTL (IV SOLUTION) ×15 IMPLANT
PACK E OPEN HEART (SUTURE) ×3 IMPLANT
PACK OPEN HEART (CUSTOM PROCEDURE TRAY) ×3 IMPLANT
PAD ARMBOARD 7.5X6 YLW CONV (MISCELLANEOUS) ×6 IMPLANT
PAD ELECT DEFIB RADIOL ZOLL (MISCELLANEOUS) ×3 IMPLANT
PENCIL BUTTON HOLSTER BLD 10FT (ELECTRODE) ×3 IMPLANT
PUNCH AORTIC ROTATE  4.5MM 8IN (MISCELLANEOUS) ×1 IMPLANT
SET CARDIOPLEGIA MPS 5001102 (MISCELLANEOUS) ×1 IMPLANT
SPONGE LAP 18X18 RF (DISPOSABLE) ×2 IMPLANT
SUT BONE WAX W31G (SUTURE) ×3 IMPLANT
SUT MNCRL AB 4-0 PS2 18 (SUTURE) ×1 IMPLANT
SUT PROLENE 3 0 SH1 36 (SUTURE) ×3 IMPLANT
SUT PROLENE 4 0 TF (SUTURE) ×6 IMPLANT
SUT PROLENE 6 0 CC (SUTURE) ×6 IMPLANT
SUT PROLENE 7 0 BV1 MDA (SUTURE) ×3 IMPLANT
SUT PROLENE 7.0 RB 3 (SUTURE) ×1 IMPLANT
SUT PROLENE 8 0 BV175 6 (SUTURE) ×3 IMPLANT
SUT STEEL 6MS V (SUTURE) ×3 IMPLANT
SUT STEEL SZ 6 DBL 3X14 BALL (SUTURE) ×3 IMPLANT
SUT VIC AB 1 CTX 18 (SUTURE) ×6 IMPLANT
SUT VIC AB 2-0 CT1 27 (SUTURE) ×3
SUT VIC AB 2-0 CT1 TAPERPNT 27 (SUTURE) IMPLANT
SYSTEM SAHARA CHEST DRAIN ATS (WOUND CARE) ×3 IMPLANT
TAPE CLOTH SURG 4X10 WHT LF (GAUZE/BANDAGES/DRESSINGS) ×1 IMPLANT
TOWEL GREEN STERILE (TOWEL DISPOSABLE) ×3 IMPLANT
TOWEL GREEN STERILE FF (TOWEL DISPOSABLE) ×3 IMPLANT
TRAY FOLEY SLVR 16FR TEMP STAT (SET/KITS/TRAYS/PACK) ×3 IMPLANT
TUBING INSUFFLATION (TUBING) ×3 IMPLANT
UNDERPAD 30X30 (UNDERPADS AND DIAPERS) ×3 IMPLANT
WATER STERILE IRR 1000ML POUR (IV SOLUTION) ×6 IMPLANT

## 2018-07-20 NOTE — Transfer of Care (Signed)
Immediate Anesthesia Transfer of Care Note  Patient: Alec [name]  Procedure(s) Performed: CORONARY ARTERY BYPASS GRAFTING (CABG) times three using left internal mammary artery to the LAD, and using endoscopically harvested right saphenous vein to PDA and intermedius. (N/A Chest) TRANSESOPHAGEAL ECHOCARDIOGRAM (TEE) (N/A )  Patient Location: SICU  Anesthesia Type:General  Level of Consciousness: Patient remains intubated per anesthesia plan  Airway & Oxygen Therapy: Patient remains intubated per anesthesia plan and Patient placed on Ventilator (see vital sign flow sheet for setting)  Post-op Assessment: Report given to RN and Post -op Vital signs reviewed and stable  Post vital signs: Reviewed and stable  Last Vitals:  Vitals Value Taken Time  BP    Temp    Pulse    Resp    SpO2      Last Pain:  Vitals:   07/20/18 0615  TempSrc:   PainSc: 0-No pain      Patients Stated Pain Goal: 3 (78/93/81 0175)  Complications: No apparent anesthesia complications

## 2018-07-20 NOTE — Progress Notes (Signed)
Echocardiogram OR TEE

## 2018-07-20 NOTE — Progress Notes (Signed)
      [address]       [place] [address]             [phone]      Extubated BP 101/65 (BP Location: Right Arm)   Pulse 89   Temp 97.7 F (36.5 C)   Resp 11   Ht 5\' 6"  (1.676 m)   Wt 88 kg   SpO2 99%   BMI 31.31 kg/m   Intake/Output Summary (Last 24 hours) at 07/20/2018 2109 Last data filed at 07/20/2018 2100 Gross per 24 hour  Intake 4565.49 ml  Output 1713 ml  Net 2852.49 ml   CI= 2.4 Creatinine 1.6, Hct= 26 Doing well early postop  Remo Lipps C. Roxan Hockey, MD [facility] [phone]

## 2018-07-20 NOTE — H&P (Signed)
[address]       [place] [address]             [phone]        Alec [name] [place] Medical Record #[id number] Date of Birth: 1945-11-06  Referring: Dr. Ellyn Hack, MD Primary Care: Mayra Neer, MD Primary Cardiologist:James Hochrein, MD  Chief Complaint: Chest discomfort and shortness of breath  Reason for consultation: Coronary artery disease  History of Present Illness:     This is a 72 year old Caucasian male with a past medical history of hyperlipidemia, hypertension, diabetes mellitus, and remote tobacco abuse who was initially seen by cardiology in July for complaints of chest discomfort. This happened with activity, at times radiated to neck/jaw, sometimes associated with diaphoresis, and was also associated with shortness of breath. The stress test was not arranged in July, patient notes that because of continued symptoms were increasing in frequency and severity he called the cardiology office and arranged for a stress test to be done.  his most recent episode occurred yesterday.  Stress test done last week showed abnormal with ST depression in II, III, an aVF leads. There was also T-wave inversion during stress test. These changes evolved into T-wave inversion during the recovery phase. He presented to [facility] in order to undergo a cardiac catheterization. Results showed LVEF 50-55%, mid left main to proximal LAD 50% stenosis, proximal LAD to mid LAD 80% stenosis, mid RCA 95% stenosis.  Currently, wife is at his bedside. Patient is in no acute distress, denies chest pain. He does have some shortness of breath, however.  Patient currently employed as a Administrator, daily halls.  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  He also noted increasing chest pain while using a push mower recently.  Current Activity/ Functional Status: Patient is independent with mobility/ambulation,  transfers, ADL's, IADL's.   Zubrod Score: At the time of surgery this patient's most appropriate activity status/level should be described as: []     0    Normal activity, no symptoms [x]     1    Restricted in physical strenuous activity but ambulatory, able to do out light work []     2    Ambulatory and capable of self care, unable to do work activities, up and about  more than 50%  Of the time                            []     3    Only limited self care, in bed greater than 50% of waking hours []     4    Completely disabled, no self care, confined to bed or chair []     5    Moribund  Past Medical History:  Diagnosis Date  . Anxiety   . Arthritis    "maybe in my hands" (07/15/2018)  . Bladder cancer ([name]) 2009  . CKD (chronic kidney disease), stage III ([place])   . Coronary artery disease   . Degenerative arthritis of shoulder region 08/2013   left  . Disorder of bursae and tendons in left shoulder region 08/2013  . Gout    "on daily RX" (07/15/2018)  . History of kidney stones   . Hyperlipidemia   . Hypertension    under control with meds., has been on med. > 10 yr.  . IDDM (  insulin dependent diabetes mellitus) ([place])     Past Surgical History:  Procedure Laterality Date  . CARDIAC CATHETERIZATION  07/15/2018  . CHOLECYSTECTOMY  08/29/2012   Procedure: LAPAROSCOPIC CHOLECYSTECTOMY WITH INTRAOPERATIVE CHOLANGIOGRAM;  Surgeon: Imogene Burn. Georgette Dover, MD;  Location: [facility];  Service: General;  Laterality: N/A;  . LEFT HEART CATH AND CORONARY ANGIOGRAPHY N/A 07/15/2018   Procedure: LEFT HEART CATH AND CORONARY ANGIOGRAPHY;  Surgeon: Leonie Man, MD;  Location: [facility];  Service: Cardiovascular;  Laterality: N/A;  . SHOULDER ARTHROSCOPY W/ ROTATOR CUFF REPAIR Right 2018  . SHOULDER ARTHROSCOPY WITH SUBACROMIAL DECOMPRESSION, ROTATOR CUFF REPAIR AND BICEP TENDON REPAIR Left 09/03/2013   Procedure: LEFT SHOULDER ARTHROSCOPY WITH EXTENSIVE DEBRIDMENT, DISTAL CLAVICULECTOMY,  ROTATOR CUFF REPAIR AND SUBACROMIAL DECOMPRESSION PARTIAL ACRIOMIOPLASTY WITH CORACOACROMIAL RELEASE;  Surgeon: Renette Butters, MD;  Location: [facility];  Service: Orthopedics;  Laterality: Left;  . TRANSURETHRAL RESECTION OF BLADDER TUMOR WITH MITOMYCIN-C  08/24/2008    Social History   Tobacco Use  Smoking Status Former Smoker  . Packs/day: 2.00  . Years: 25.00  . Pack years: 50.00  . Types: Cigarettes  . Last attempt to quit: 09/11/1997  . Years since quitting: 20.8  Smokeless Tobacco Never Used    Social History   Substance and Sexual Activity  Alcohol Use Not Currently   Comment: 07/17/2018 "nothing since the mid 1990s"  Patient is still working-drives a truck locally  Allergy: No Known Allergies  Current Facility-Administered Medications  Medication Dose Route Frequency Provider Last Rate Last Dose  . cefUROXime (ZINACEF) 1.5 g in sodium chloride 0.9 % 100 mL IVPB  1.5 g Intravenous To OR Grace Isaac, MD      . cefUROXime (ZINACEF) 750 mg in sodium chloride 0.9 % 100 mL IVPB  750 mg Intravenous To OR Grace Isaac, MD      . chlorhexidine (HIBICLENS) 4 % liquid 2 application  30 mL Topical UD Grace Isaac, MD      . dexmedetomidine (PRECEDEX) 400 MCG/100ML (4 mcg/mL) infusion  0.1-0.7 mcg/kg/hr Intravenous To OR Grace Isaac, MD      . DOPamine (INTROPIN) 800 mg in dextrose 5 % 250 mL (3.2 mg/mL) infusion  0-10 mcg/kg/min Intravenous To OR Grace Isaac, MD      . EPINEPHrine (ADRENALIN) 4 mg in dextrose 5 % 250 mL (0.016 mg/mL) infusion  0-10 mcg/min Intravenous To OR Grace Isaac, MD      . heparin 2,500 Units, papaverine 30 mg in electrolyte-148 (PLASMALYTE-148) 500 mL irrigation   Irrigation To OR Grace Isaac, MD      . heparin 30,000 units/NS 1000 mL solution for CELLSAVER   Other To OR Grace Isaac, MD      . insulin regular, human (MYXREDLIN) 100 units/ 100 mL infusion   Intravenous To OR Grace Isaac, MD      . magnesium sulfate (IV Push/IM) injection 40 mEq  40 mEq Other To OR Grace Isaac, MD      . milrinone (PRIMACOR) 20 MG/100 ML (0.2 mg/mL) infusion  0.3 mcg/kg/min Intravenous To OR Grace Isaac, MD      . nitroGLYCERIN 50 mg in dextrose 5 % 250 mL (0.2 mg/mL) infusion  2-200 mcg/min Intravenous To OR Grace Isaac, MD      . norepinephrine (LEVOPHED) 4mg  in D5W 223mL premix infusion  0-40 mcg/min Intravenous Titrated Grace Isaac, MD      .  phenylephrine (NEOSYNEPHRINE) 20-0.9 MG/250ML-% infusion  30-200 mcg/min Intravenous To OR Grace Isaac, MD      . potassium chloride injection 80 mEq  80 mEq Other To OR Grace Isaac, MD      . tranexamic acid (CYKLOKAPRON) 2,500 mg in sodium chloride 0.9 % 250 mL (10 mg/mL) infusion  1.5 mg/kg/hr Intravenous To OR Grace Isaac, MD      . tranexamic acid (CYKLOKAPRON) bolus via infusion - over 30 minutes 1,335 mg  15 mg/kg Intravenous To OR Grace Isaac, MD      . tranexamic acid (CYKLOKAPRON) pump prime solution 178 mg  2 mg/kg Intracatheter To OR Grace Isaac, MD      . vancomycin (VANCOCIN) 1,500 mg in sodium chloride 0.9 % 250 mL IVPB  1,500 mg Intravenous To OR Grace Isaac, MD        Medications Prior to Admission  Medication Sig Dispense Refill Last Dose  . allopurinol (ZYLOPRIM) 100 MG tablet Take 200 mg by mouth daily after supper.   07/19/2018 at Unknown time  . amLODipine (NORVASC) 10 MG tablet Take 10 mg by mouth daily after supper.    07/19/2018 at Unknown time  . aspirin 81 MG tablet Take 81 mg by mouth daily after supper.    Past Week at Unknown time  . atorvastatin (LIPITOR) 20 MG tablet Take 20 mg by mouth daily after supper.    07/19/2018 at Unknown time  . buPROPion (WELLBUTRIN XL) 150 MG 24 hr tablet Take 150 mg by mouth daily.   07/20/2018 at 0430  . Cholecalciferol (VITAMIN D) 2000 units tablet Take 2,000 Units by mouth daily after supper.   Past Week at  Unknown time  . folic acid (FOLVITE) 1 MG tablet Take 1 mg by mouth daily after supper.   07/19/2018 at Unknown time  . Glucosamine-Chondroitin (OSTEO BI-FLEX REGULAR STRENGTH PO) Take 1 tablet by mouth daily after supper.   Past Week at Unknown time  . insulin aspart (NOVOLOG) 100 UNIT/ML injection INJECT 85 UNITS SUBCUTANEOUSLY ONCE DAILY WITH  INSULIN  PUMP Dx Code E11.65 70 mL 3 07/19/2018 at Unknown time  . isosorbide mononitrate (IMDUR) 30 MG 24 hr tablet Take 1 tablet (30 mg total) by mouth daily. 90 tablet 3 07/20/2018 at 0430  . lisinopril-hydrochlorothiazide (PRINZIDE,ZESTORETIC) 20-12.5 MG tablet Take 2 tablets by mouth daily after supper.    Past Week at Unknown time  . methotrexate (RHEUMATREX) 2.5 MG tablet Take 15 mg by mouth every Thursday. Caution:Chemotherapy. Protect from light.   Past Week at Unknown time  . Multiple Vitamins-Minerals (ADVANCED DIABETIC MULTIVITAMIN) TABS Take 2 tablets by mouth daily after supper.   Past Week at Unknown time  . VICTOZA 18 MG/3ML SOPN INJECT 1.8 MG SUBCUTANEOUSLY ONCE DAILY AT THE SAME TIME EVERYDAY (Patient taking differently: Inject 1.2 mg into the skin daily. ) 27 mL 1 07/19/2018 at Unknown time  . glucose blood (FREESTYLE LITE) test strip USE AS INSTRUCTED TO TEST BLOOD SUGARS 3 TIMES DAILY. DX CODE-E11.65 100 each 3 Taking  . Insulin Disposable Pump (OMNIPOD 5 PACK) MISC Place 1 each onto the skin as directed. Place 1 onto skin as directed every 48 hours 15 each 3 07/15/2018 at Unknown time  . OVER THE COUNTER MEDICATION Take 1 tablet by mouth 2 (two) times daily. cognium otc supplement   07/14/2018 at Unknown time  . rOPINIRole (REQUIP) 0.5 MG tablet TAKE 1 TABLET (0.5 MG TOTAL) BY MOUTH AT BEDTIME.  30 tablet 3 More than a month at Unknown time    Family History  Problem Relation Age of Onset  . Diabetes Mother   . Cancer Father   . Coronary artery disease Father        MI at age 54.   . Sudden death Son   . CAD Maternal Grandfather          MI in his 19's  . CAD Paternal Grandfather    Review of Systems:      Cardiac Review of Systems: Y or  [  N  ]= no  Chest Pain [ y ] Exertional SOB  [Y  ]  Vertell Limber Aqua.Slicker  ]  Pedal Edema [  N ]    Palpitations [  N] Syncope  Aqua.Slicker  ]   Presyncope [ N  ]  General Review of Systems: [Y] = yes [ N ]=no Constitional:night sweats Aqua.Slicker  ]; fever Aqua.Slicker  ]; or chills [ N ]                                                                 Eye : blurred vision [ Y-WHEN SUGAR IS LOW ]; diplopia [ N  ];  Amaurosis fugax[ N ]; Resp: cough Aqua.Slicker  ];  wheezing[ N ];  hemoptysis[ N ];  GI:   vomiting[ N ];  dysphagia[N  ]; melena[N  ];  hematochezia [ N ]; PQ:ZRAQTMAUQ[ N ];   dysuria [N  ];  nocturia[N  ];                Skin: rash, swelling[ N ];,  peripheral edema[ N ];  or itching[ N ]; Insulin pump on right outer thigh currently Musculosketetal: myalgias[  N];  joint swelling[N  ]   Heme/Lymph: bruising[ N ];  bleeding[ N ];  anemia[  ];  Neuro: TIA[ N ]; ;  stroke[ N ];  vertigo[N  ];  seizures[ N ];    difficulty walking[ N ];  Endocrine: diabetes[Y  ];  thyroid dysfunction[ N ];                            Physical Exam: BP (!) 136/58   Pulse 62   Temp 97.7 F (36.5 C) (Oral)   Resp 18   Ht 5\' 6"  (1.676 m)   Wt 88 kg   SpO2 99%   BMI 31.31 kg/m    General appearance: alert, cooperative and no distress Head: Normocephalic, without obvious abnormality, atraumatic Neck: no carotid bruit, no JVD and supple, symmetrical, trachea midline Resp: clear to auscultation bilaterally Cardio: RRR, no murmur GI: Soft, non tender, bowel sounds present Extremities: No LE edema;palpable DP/PT bilaterally Neurologic: Grossly normal Cath site right radial is intact Patient has arthritic changes in both hands fingers, he notes he has problems with numbness in his hands  Diagnostic Studies & Laboratory data: LEFT HEART CATH AND CORONARY ANGIOGRAPHY  Conclusion     LV Findings:  The left ventricular  systolic function is low-normal. The left ventricular ejection fraction is 50-55% by visual estimate.  LV end diastolic pressure is moderately elevated.  Angiographic Findings:  Mid RCA-1 lesion is 40% stenosed. Mid RCA-2 lesion is  95% stenosed. Dist RCA lesion is 95% stenosed just at the takeoff of the RPAV.  Mid LM to Prox LAD lesion is 50% stenosed. -Extensive long vessel that is calcified. Very small circumflex and ramus.  Prox LAD lesion is 90% stenosed at takeoff of 1st Diag. Prox LAD to Mid LAD lesion is 80% stenosed.   Severe multivessel disease with distal left main calcified 40 to 50% stenosis, proximal LAD tandem 90 and 80% lesions covering along area and including major first diagonal branch.  This is in conjunction with 2 significant mid and distal RCA lesions of 95% and then a 90% bifurcation lesion just prior to the takeoff of the PAB. Likely low normal EF but difficult to assess wall motion because of ectopy.  Recommend 2D echo prior to discharge. Moderate elevated LVEDP.  Based on the patient's anatomy, I think he is best served for CABG.  We will consult CT surgery. Given his reduced renal function, we will admit him for overnight observation for IV fluid hydration.  Would like to consider increasing Crestor dose on discharge. Would start antianginal medication if blood pressure room tolerates use amlodipine versus Imdur and provide Seldinger nitroglycerin as needed.  Recommend Aspirin 81mg  daily for severe multivessel CAD.   ECHO: Transthoracic Echocardiography  Patient:    Alec [name], [place]man[place] MR #:       [id number] Study Date: 07/16/2018 Gender:     M Age:        69 Height:     167.6 cm Weight:     89 kg BSA:        2.07 m^2 Pt. Status: Room:       6C08C   ADMITTING    Glenetta Hew, MD  ATTENDING    Glenetta Hew, MD  ORDERING     Glenetta Hew, MD  REFERRING    Glenetta Hew, MD  PERFORMING   [facility], Inpatient  SONOGRAPHER  [facility]  cc:  ------------------------------------------------------------------- LV EF: 55% -   60%  ------------------------------------------------------------------- Indications:      CAD of native vessels 414.01.  ------------------------------------------------------------------- History:   PMH:  Abnormal stress electrocardiography using treadmill.  Risk factors:  Hypertension. Diabetes mellitus. Dyslipidemia.  ------------------------------------------------------------------- Study Conclusions  - Left ventricle: The cavity size was normal. Wall thickness was   normal. Systolic function was normal. The estimated ejection   fraction was in the range of 55% to 60%. Basal to mid inferior   hypokinesis. Left ventricular diastolic function parameters were   normal. - Aortic valve: There was no stenosis. There was trivial   regurgitation. - Mitral valve: Mildly calcified annulus. There was trivial   regurgitation. - Left atrium: The atrium was mildly dilated. - Right ventricle: The cavity size was normal. Systolic function   was normal. - Pulmonary arteries: No complete TR doppler jet so unable to   estimate PA systolic pressure. - Inferior vena cava: The vessel was normal in size. The   respirophasic diameter changes were in the normal range (>= 50%),   consistent with normal central venous pressure.  Impressions:  - Normal LV size with EF 55-60%. Basal to mid inferior hypokinesis.   Normal RV size and systolic function. No significant valvular   abnormalities.  ------------------------------------------------------------------- Study data:  No prior study was available for comparison.  Study status:  Routine.  Procedure:  The patient reported no pain pre or post test. Transthoracic echocardiography. Image quality was adequate.  Study completion:  There were no complications. Transthoracic echocardiography.  M-mode, complete 2D, spectral Doppler, and color  Doppler.  Birthdate:  Patient birthdate: 04-26-1946.  Age:  Patient is 72 yr old.  Sex:  Gender: male. BMI: 31.7 kg/m^2.  Blood pressure:     139/62  Patient status: Inpatient.  Study date:  Study date: 07/16/2018. Study time: 10:53 AM.  Location:  Echo laboratory.  -------------------------------------------------------------------  ------------------------------------------------------------------- Left ventricle:  The cavity size was normal. Wall thickness was normal. Systolic function was normal. The estimated ejection fraction was in the range of 55% to 60%. Basal to mid inferior hypokinesis. The transmitral flow pattern was normal. The deceleration time of the early transmitral flow velocity was normal. The pulmonary vein flow pattern was normal. The tissue Doppler parameters were normal. Left ventricular diastolic function parameters were normal.  ------------------------------------------------------------------- Aortic valve:   Trileaflet; mildly calcified leaflets.  Doppler: There was no stenosis.   There was trivial regurgitation.    VTI ratio of LVOT to aortic valve: 0.73. Valve area (VTI): 2.77 cm^2. Indexed valve area (VTI): 1.34 cm^2/m^2. Peak velocity ratio of LVOT to aortic valve: 0.86. Valve area (Vmax): 3.27 cm^2. Indexed valve area (Vmax): 1.58 cm^2/m^2. Mean velocity ratio of LVOT to aortic valve: 0.89. Valve area (Vmean): 3.4 cm^2. Indexed valve area (Vmean): 1.64 cm^2/m^2.    Mean gradient (S): 5 mm Hg. Peak gradient (S): 10 mm Hg.  ------------------------------------------------------------------- Aorta:  Aortic root: The aortic root was normal in size. Ascending aorta: The ascending aorta was normal in size.  ------------------------------------------------------------------- Mitral valve:   Mildly calcified annulus.  Doppler:   There was no evidence for stenosis.   There was trivial regurgitation.    Valve area by pressure half-time: 3.49 cm^2.  Indexed valve area by pressure half-time: 1.69 cm^2/m^2.    Peak gradient (D): 5 mm Hg.   ------------------------------------------------------------------- Left atrium:  The atrium was mildly dilated.  ------------------------------------------------------------------- Right ventricle:  The cavity size was normal. Systolic function was normal.  ------------------------------------------------------------------- Pulmonic valve:    Structurally normal valve.   Cusp separation was normal.  Doppler:  Transvalvular velocity was within the normal range. There was trivial regurgitation.  ------------------------------------------------------------------- Tricuspid valve:   Doppler:  There was no significant regurgitation.  ------------------------------------------------------------------- Pulmonary artery:   No complete TR doppler jet so unable to estimate PA systolic pressure.  ------------------------------------------------------------------- Right atrium:  The atrium was normal in size.  ------------------------------------------------------------------- Pericardium:  There was no pericardial effusion.  ------------------------------------------------------------------- Systemic veins: Inferior vena cava: The vessel was normal in size. The respirophasic diameter changes were in the normal range (>= 50%), consistent with normal central venous pressure.  ------------------------------------------------------------------- Measurements   Left ventricle                           Value          Reference  LV ID, ED, PLAX chordal                  48    mm       43 - 52  LV ID, ES, PLAX chordal                  33    mm       23 - 38  LV fx shortening, PLAX chordal           31    %        >=29  LV PW thickness, ED  9     mm       ----------  IVS/LV PW ratio, ED                      1              <=1.3  Stroke volume, 2D                        105    ml       ----------  Stroke volume/bsa, 2D                    51    ml/m^2   ----------  LV ejection fraction, 1-p A4C            61    %        ----------  LV end-diastolic volume, 2-p             84    ml       ----------  LV end-systolic volume, 2-p              35    ml       ----------  LV ejection fraction, 2-p                59    %        ----------  Stroke volume, 2-p                       49    ml       ----------  LV end-diastolic volume/bsa, 2-p         41    ml/m^2   ----------  LV end-systolic volume/bsa, 2-p          17    ml/m^2   ----------  Stroke volume/bsa, 2-p                   23.8  ml/m^2   ----------  LV e&', lateral                           9.57  cm/s     ----------  LV E/e&', lateral                         11.6           ----------  LV e&', medial                            9.25  cm/s     ----------  LV E/e&', medial                          12             ----------  LV e&', average                           9.41  cm/s     ----------  LV E/e&', average                         11.8           ----------    Ventricular septum  Value          Reference  IVS thickness, ED                        9     mm       ----------    LVOT                                     Value          Reference  LVOT ID, S                               22    mm       ----------  LVOT area                                3.8   cm^2     ----------  LVOT peak velocity, S                    137   cm/s     ----------  LVOT mean velocity, S                    98.3  cm/s     ----------  LVOT VTI, S                              27.5  cm       ----------  LVOT peak gradient, S                    8     mm Hg    ----------    Aortic valve                             Value          Reference  Aortic valve peak velocity, S            159   cm/s     ----------  Aortic valve mean velocity, S            110   cm/s     ----------  Aortic valve VTI, S                      37.7  cm        ----------  Aortic mean gradient, S                  5     mm Hg    ----------  Aortic peak gradient, S                  10    mm Hg    ----------  VTI ratio, LVOT/AV                       0.73           ----------  Aortic valve area, VTI                   2.77  cm^2     ----------  Aortic valve area/bsa, VTI               1.34  cm^2/m^2 ----------  Velocity ratio, peak, LVOT/AV            0.86           ----------  Aortic valve area, peak velocity         3.27  cm^2     ----------  Aortic valve area/bsa, peak              1.58  cm^2/m^2 ----------  velocity  Velocity ratio, mean, LVOT/AV            0.89           ----------  Aortic valve area, mean velocity         3.4   cm^2     ----------  Aortic valve area/bsa, mean              1.64  cm^2/m^2 ----------  velocity    Aorta                                    Value          Reference  Aortic root ID, ED                       29    mm       ----------    Left atrium                              Value          Reference  LA ID, A-P, ES                           41    mm       ----------  LA ID/bsa, A-P                           1.98  cm/m^2   <=2.2  LA volume, S                             63.7  ml       ----------  LA volume/bsa, S                         30.8  ml/m^2   ----------  LA volume, ES, 1-p A4C                   57.8  ml       ----------  LA volume/bsa, ES, 1-p A4C               28    ml/m^2   ----------  LA volume, ES, 1-p A2C                   68    ml       ----------  LA volume/bsa, ES, 1-p A2C               32.9  ml/m^2   ----------    Mitral valve  Value          Reference  Mitral E-wave peak velocity              111   cm/s     ----------  Mitral A-wave peak velocity              95.1  cm/s     ----------  Mitral deceleration time                 215   ms       150 - 230  Mitral pressure half-time                63    ms       ----------  Mitral peak gradient, D                  5      mm Hg    ----------  Mitral E/A ratio, peak                   1.2            ----------  Mitral valve area, PHT, DP               3.49  cm^2     ----------  Mitral valve area/bsa, PHT, DP           1.69  cm^2/m^2 ----------    Right atrium                             Value          Reference  RA ID, S-I, ES, A4C              (H)     53.1  mm       34 - 49  RA area, ES, A4C                         14.4  cm^2     8.3 - 19.5  RA volume, ES, A/L                       33.5  ml       ----------  RA volume/bsa, ES, A/L                   16.2  ml/m^2   ----------    Systemic veins                           Value          Reference  Estimated CVP                            3     mm Hg    ----------    Right ventricle                          Value          Reference  TAPSE                                    15.5  mm       ----------  RV s&', lateral, S                        14.5  cm/s     ----------  Legend: (L)  and  (H)  mark values outside specified reference range.  ------------------------------------------------------------------- Prepared and Electronically Authenticated by  Loralie Champagne, M.D. 2019-10-17T16:06:11  Vascular: PREOPERATIVE VASCULAR EVALUATION Risk Factors:   Hypertension, hyperlipidemia, Diabetes, past history of         smoking, coronary artery disease. Comparison Study: No prior study on file for comparison.  Performing Technologist: Sharion Dove RVS   Examination Guidelines: A complete evaluation includes B-mode imaging, spectral Doppler, color Doppler, and power Doppler as needed of all accessible portions of each vessel. Bilateral testing is considered an integral part of a complete examination. Limited examinations for reoccurring indications may be performed as noted.   Right Carotid Findings: +----------+--------+--------+--------+------------+------------------+      PSV cm/sEDV cm/sStenosisDescribe  Comments       +----------+--------+--------+--------+------------+------------------+ CCA Prox 94   13             intimal thickening +----------+--------+--------+--------+------------+------------------+ CCA Distal77   14             intimal thickening +----------+--------+--------+--------+------------+------------------+ ICA Prox 81   21       heterogenousShadowing      +----------+--------+--------+--------+------------+------------------+ ICA Distal88   28                       +----------+--------+--------+--------+------------+------------------+ ECA    139   15                       +----------+--------+--------+--------+------------+------------------+  Portions of this table do not appear on this page.    +----------+--------+-------+--------+------------+      PSV cm/sEDV cmsDescribeArm Pressure +----------+--------+-------+--------+------------+ [id number]           138      +----------+--------+-------+--------+------------+  +---------+--------+--+--------+-+ VertebralPSV cm/s35EDV cm/s7 +---------+--------+--+--------+-+  Left Carotid Findings: +----------+--------+--------+--------+------------+------------------+      PSV cm/sEDV cm/sStenosisDescribe  Comments      +----------+--------+--------+--------+------------+------------------+ CCA Prox 123   11             intimal thickening +----------+--------+--------+--------+------------+------------------+ CCA Distal103   18             intimal thickening +----------+--------+--------+--------+------------+------------------+ ICA Prox 62   10       heterogenous          +----------+--------+--------+--------+------------+------------------+ ICA Distal88    19                       +----------+--------+--------+--------+------------+------------------+ ECA    114   13                       +----------+--------+--------+--------+------------+------------------+   +----------+--------+--------+--------+------------+ SubclavianPSV cm/sEDV cm/sDescribeArm Pressure +----------+--------+--------+--------+------------+      140           139      +----------+--------+--------+--------+------------+  +---------+--------+--+--------+--+ VertebralPSV cm/s59EDV cm/s15 +---------+--------+--+--------+--+   ABI Findings: +---------+------------------+-----+---------+--------+ Right  Rt Pressure (mmHg)IndexWaveform Comment  +---------+------------------+-----+---------+--------+ Brachial 138           triphasic     +---------+------------------+-----+---------+--------+ PTA   254        1.83 biphasic      +---------+------------------+-----+---------+--------+ DP    254        1.83 biphasic      +---------+------------------+-----+---------+--------+ Sharen Hones  0.65           +---------+------------------+-----+---------+--------+  +---------+------------------+-----+---------+-------+ Left   Lt Pressure (mmHg)IndexWaveform Comment +---------+------------------+-----+---------+-------+ Brachial 139           triphasic     +---------+------------------+-----+---------+-------+ PTA   254        1.83 biphasic      +---------+------------------+-----+---------+-------+ DP    254           biphasic      +---------+------------------+-----+---------+-------+ Great Toe105        0.76            +---------+------------------+-----+---------+-------+  +-------+---------------+----------------+ ABI/TBIToday's ABI/TBIPrevious ABI/TBI +-------+---------------+----------------+ Right 1.83               +-------+---------------+----------------+ Left  1.83               +-------+---------------+----------------+ Arterial wall calcification precludes accurate ankle pressures and ABIs.  Right Doppler Findings: +-----------+--------+-----+----------+--------+ Site    PressureIndexDoppler  Comments +-----------+--------+-----+----------+--------+ Brachial  138      triphasic      +-----------+--------+-----+----------+--------+ Radial          biphasic      +-----------+--------+-----+----------+--------+ Ulnar          monophasic     +-----------+--------+-----+----------+--------+ Palmar Arch            WNL    +-----------+--------+-----+----------+--------+    Left Doppler Findings: +-----------+--------+-----+---------+--------+ Site    PressureIndexDoppler Comments +-----------+--------+-----+---------+--------+ Subclavian                  +-----------+--------+-----+---------+--------+ Axillary                   +-----------+--------+-----+---------+--------+ Brachial  139      triphasic     +-----------+--------+-----+---------+--------+ Forearm                   +-----------+--------+-----+---------+--------+ Radial          triphasic     +-----------+--------+-----+---------+--------+ Ulnar          triphasic     +-----------+--------+-----+---------+--------+ Palmar Arch            WNL    +-----------+--------+-----+---------+--------+ Digit                     +-----------+--------+-----+---------+--------+    Summary: Right Carotid: The extracranial vessels were near-normal with only minimal wall        thickening or plaque.  Left Carotid: The extracranial vessels were near-normal with only minimal wall       thickening or plaque. Vertebrals: Bilateral vertebral arteries demonstrate antegrade flow. Subclavians: Normal flow hemodynamics were seen in bilateral subclavian       arteries.  Right ABI: Resting right ankle-brachial index indicates noncompressible right lower extremity arteries.The right toe-brachial index is abnormal. ABIs are unreliable. Left ABI: Resting left ankle-brachial index indicates noncompressible left lower extremity arteries.The left toe-brachial index is normal. Right Upper Extremity: Doppler waveforms remain within normal limits with right radial compression. Doppler waveforms remain within normal limits with right ulnar compression.     Electronically signed by Monica Martinez MD on 07/16/2018 at 6:10:40 PM.   Recent Radiology Findings:  Dg Chest 2 View  Result Date: 07/16/2018 CLINICAL DATA:  Preoperative evaluation EXAM: CHEST - 2 VIEW COMPARISON:  01/03/2009 FINDINGS: The heart size and mediastinal contours are within normal limits. Both lungs are clear. The visualized skeletal structures are unremarkable. IMPRESSION: No active cardiopulmonary disease. Electronically Signed   By: Linus Mako.D.  On: 07/16/2018 09:19     Recent Lab Findings: Lab Results  Component Value Date   WBC 4.5 07/15/2018   HGB 12.1 (L) 07/15/2018   HCT 34.7 (L) 07/15/2018   PLT 152 07/15/2018   GLUCOSE 86 07/16/2018   CHOL 94 07/16/2018   TRIG 120 07/16/2018   HDL 25 (L) 07/16/2018   LDLCALC 45 07/16/2018   ALT 32 06/16/2018   AST 23 06/16/2018   NA 139 07/16/2018   K 4.1 07/16/2018   CL 109 07/16/2018   CREATININE 1.50 (H) 07/16/2018   BUN 15 07/16/2018   CO2 24 07/16/2018   TSH  2.520 07/14/2018   HGBA1C 6.8 (H) 06/16/2018    Assessment / Plan:   1. Coronary artery disease-would benefit from CABG 2. History of hypertension-on Amlodipine 10 mg daily, Lisinopril 40 mg daily, and HCTZ 25 mg daily. 3. History of hyperlipidemia-on Atorvastatin 4. History of IDDM-HGA1C 6.8 5. Creatinine today 1.5 . He is on both Lisinopril and HCTZ. Appears creatinine has been 1.5-1.8 for several years  Chronic Kidney Disease   Stage I     GFR >90  Stage II    GFR 60-89  Stage IIIA GFR 45-59  Stage IIIB GFR 30-44  Stage IV   GFR 15-29  Stage V    GFR  <15  Lab Results  Component Value Date   CREATININE 1.50 (H) 07/16/2018   Estimated Creatinine Clearance: 47.6 mL/min (A) (by C-G formula based on SCr of 1.5 mg/dL (H)).  Stage IIIb chronic kidney disease Diabetes with complications of coronary artery disease  I reviewed the patient's cardiac cath findings symptoms of progressive angina for for the past 3 months, positive stress test and cath findings With his diabetic state and multivessel coronary artery disease I recommended proceeding with coronary artery bypass grafting.  On review of the films there is some concern if the patient has an anomalous circumflex circumflex that was never identified.  Patient's to be hydrated because of his chronic kidney failure after cath.  He is agreeable with proceeding with coronary artery bypass grafting  The goals risks and alternatives of the planned surgical procedure Procedure(s): CORONARY ARTERY BYPASS GRAFTING (CABG) (N/A) TRANSESOPHAGEAL ECHOCARDIOGRAM (TEE) (N/A)  have been discussed with the patient in detail. The risks of the procedure including death, infection, stroke, myocardial infarction, bleeding, blood transfusion have all been discussed specifically. He is aware of the need for intensive care's stay, tubes and lines, postop ventilation. I have quoted [name] a 30% of perioperative mortality and a complication rate as high  as 2 %. The patient's questions have been answered.DALTON MOLESWORTH is willing  to proceed with the planned procedure.  Grace Isaac MD      [address] [place] [address] Office [phone]   [place]

## 2018-07-20 NOTE — Brief Op Note (Signed)
      [address]       [place] [address]             [phone]      07/20/2018  12:12 PM  PATIENT:  Alec [name]  72 y.o. male  PRE-OPERATIVE DIAGNOSIS:  CAD unstable angina  POST-OPERATIVE DIAGNOSIS:  Same    PROCEDURE:  Procedure(s): CORONARY ARTERY BYPASS GRAFTING (CABG) times three using left internal mammary artery to the LAD, and using endoscopically harvested right saphenous vein to PDA and intermedius. (N/A) TRANSESOPHAGEAL ECHOCARDIOGRAM (TEE) (N/A)  SVG to PDA SVG to intermedius LIMA to LAD  SURGEON:  Surgeon(s) and Role:    * Grace Isaac, MD - Primary  PHYSICIAN ASSISTANT:  Nicholes Rough, PA-C   ANESTHESIA:   general  EBL:  750 mL   BLOOD ADMINISTERED:none  DRAINS: ROUTINE   LOCAL MEDICATIONS USED:  NONE  SPECIMEN:  No Specimen  DISPOSITION OF SPECIMEN:  N/A  COUNTS:  YES  DICTATION: .Dragon Dictation  PLAN OF CARE: Admit to inpatient   PATIENT DISPOSITION:  ICU - intubated and hemodynamically stable.   Delay start of Pharmacological VTE agent (>24hrs) due to surgical blood loss or risk of bleeding: yes

## 2018-07-20 NOTE — Anesthesia Procedure Notes (Signed)
Anesthesia Procedure Image    

## 2018-07-20 NOTE — Anesthesia Preprocedure Evaluation (Signed)
Anesthesia Evaluation  Patient identified by MRN, date of birth, ID band Patient awake    Reviewed: Allergy & Precautions, H&P , NPO status , Patient's Chart, lab work & pertinent test results  Airway Mallampati: II   Neck ROM: full    Dental   Pulmonary former smoker,    breath sounds clear to auscultation       Cardiovascular hypertension, + angina + CAD   Rhythm:regular Rate:Normal     Neuro/Psych PSYCHIATRIC DISORDERS Anxiety    GI/Hepatic   Endo/Other  diabetes, Type 2, Insulin Dependent  Renal/GU Renal InsufficiencyRenal disease     Musculoskeletal  (+) Arthritis ,   Abdominal   Peds  Hematology   Anesthesia Other Findings   Reproductive/Obstetrics                             Anesthesia Physical Anesthesia Plan  ASA: III  Anesthesia Plan: General   Post-op Pain Management:    Induction: Intravenous  PONV Risk Score and Plan: 2 and Ondansetron, Dexamethasone, Midazolam and Treatment may vary due to age or medical condition  Airway Management Planned: Oral ETT  Additional Equipment: Arterial line, CVP, PA Cath, TEE and Ultrasound Guidance Line Placement  Intra-op Plan:   Post-operative Plan: Post-operative intubation/ventilation  Informed Consent: I have reviewed the patients History and Physical, chart, labs and discussed the procedure including the risks, benefits and alternatives for the proposed anesthesia with the patient or authorized representative who has indicated his/her understanding and acceptance.     Plan Discussed with: CRNA, Anesthesiologist and Surgeon  Anesthesia Plan Comments:         Anesthesia Quick Evaluation

## 2018-07-20 NOTE — Anesthesia Procedure Notes (Signed)
Central Venous Catheter Insertion Performed by: Myrtie Soman, MD, anesthesiologist Start/End10/21/2019 6:30 AM, 07/20/2018 6:45 AM Patient location: Pre-op. Preanesthetic checklist: patient identified, IV checked, site marked, risks and benefits discussed, surgical consent, monitors and equipment checked, pre-op evaluation, timeout performed and anesthesia consent Position: Trendelenburg Lidocaine 1% used for infiltration and patient sedated Hand hygiene performed , maximum sterile barriers used  and Seldinger technique used Catheter size: 8.5 Fr Total catheter length 10. Central line was placed.Sheath introducer Swan type:thermodilution PA Cath depth:40 Procedure performed using ultrasound guided technique. Ultrasound Notes:anatomy identified, needle tip was noted to be adjacent to the nerve/plexus identified, no ultrasound evidence of intravascular and/or intraneural injection and image(s) printed for medical record Attempts: 1 Following insertion, line sutured, dressing applied and Biopatch. Post procedure assessment: blood return through all ports, free fluid flow and no air  Patient tolerated the procedure well with no immediate complications.

## 2018-07-20 NOTE — Anesthesia Procedure Notes (Signed)
Procedure Name: Intubation Date/Time: 07/20/2018 7:46 AM Performed by: Wilburn Cornelia, CRNA Pre-anesthesia Checklist: Patient identified, Emergency Drugs available, Suction available, Patient being monitored and Timeout performed Patient Re-evaluated:Patient Re-evaluated prior to induction Oxygen Delivery Method: Circle system utilized Preoxygenation: Pre-oxygenation with 100% oxygen Induction Type: IV induction Ventilation: Mask ventilation without difficulty and Oral airway inserted - appropriate to patient size Laryngoscope Size: Mac and 4 Grade View: Grade I Tube type: Oral Tube size: 8.0 mm Number of attempts: 1 Airway Equipment and Method: Stylet Placement Confirmation: ETT inserted through vocal cords under direct vision,  positive ETCO2,  CO2 detector and breath sounds checked- equal and bilateral Secured at: 22 cm Tube secured with: Tape Dental Injury: Teeth and Oropharynx as per pre-operative assessment

## 2018-07-20 NOTE — Anesthesia Procedure Notes (Signed)
Arterial Line Insertion Start/End10/21/2019 6:50 AM, 07/20/2018 6:53 AM Performed by: Wilburn Cornelia, CRNA, CRNA  Patient location: Pre-op. Preanesthetic checklist: patient identified, IV checked, site marked, risks and benefits discussed, surgical consent, monitors and equipment checked, pre-op evaluation, timeout performed and anesthesia consent Lidocaine 1% used for infiltration and patient sedated Left, radial was placed Catheter size: 20 G Hand hygiene performed  and maximum sterile barriers used  Allen's test indicative of satisfactory collateral circulation Attempts: 1 Procedure performed without using ultrasound guided technique. Following insertion, Biopatch and dressing applied. Post procedure assessment: normal  Patient tolerated the procedure well with no immediate complications.

## 2018-07-20 NOTE — Procedures (Signed)
Extubation Procedure Note  Patient Details:   Name: Alec [name] DOB: 1946/09/11 MRN: [id number]   Airway Documentation:    Vent end date: 07/20/18 Vent end time: 1730   Evaluation  O2 sats: stable throughout Complications: No apparent complications Patient did tolerate procedure well. Bilateral Breath Sounds: Clear, Diminished   Yes   Patient extubated to 4L nasal cannula per protocol.  Positive cuff leak noted.  No evidence of stridor.  Patient able to speak post extubation.  Sats currently 100%.  Vitals are stable.  NIF of -26, VC of 1.4L.  Incentive spirometry performed x5 with achieved goal of 750.  No complications noted.    Philomena Doheny 07/20/2018, 5:32 PM

## 2018-07-21 ENCOUNTER — Inpatient Hospital Stay (HOSPITAL_COMMUNITY): Payer: Medicare Other

## 2018-07-21 ENCOUNTER — Encounter (HOSPITAL_COMMUNITY): Payer: Self-pay | Admitting: Cardiothoracic Surgery

## 2018-07-21 DIAGNOSIS — Z951 Presence of aortocoronary bypass graft: Secondary | ICD-10-CM

## 2018-07-21 DIAGNOSIS — N183 Chronic kidney disease, stage 3 (moderate): Secondary | ICD-10-CM

## 2018-07-21 DIAGNOSIS — E781 Pure hyperglyceridemia: Secondary | ICD-10-CM

## 2018-07-21 LAB — POCT I-STAT, CHEM 8
BUN: 24 mg/dL — ABNORMAL HIGH (ref 8–23)
Calcium, Ion: 1.2 mmol/L (ref 1.15–1.40)
Chloride: 105 mmol/L (ref 98–111)
Creatinine, Ser: 1.6 mg/dL — ABNORMAL HIGH (ref 0.61–1.24)
Glucose, Bld: 158 mg/dL — ABNORMAL HIGH (ref 70–99)
HCT: 29 % — ABNORMAL LOW (ref 39.0–52.0)
Hemoglobin: 9.9 g/dL — ABNORMAL LOW (ref 13.0–17.0)
Potassium: 4.1 mmol/L (ref 3.5–5.1)
Sodium: 139 mmol/L (ref 135–145)
TCO2: 23 mmol/L (ref 22–32)

## 2018-07-21 LAB — CBC
HCT: 29.3 % — ABNORMAL LOW (ref 39.0–52.0)
HCT: 31.4 % — ABNORMAL LOW (ref 39.0–52.0)
Hemoglobin: 9.9 g/dL — ABNORMAL LOW (ref 13.0–17.0)
Hemoglobin: 10.3 g/dL — ABNORMAL LOW (ref 13.0–17.0)
MCH: 32 pg (ref 26.0–34.0)
MCH: 31.8 pg (ref 26.0–34.0)
MCHC: 33.8 g/dL (ref 30.0–36.0)
MCHC: 32.8 g/dL (ref 30.0–36.0)
MCV: 94.8 fL (ref 80.0–100.0)
MCV: 96.9 fL (ref 80.0–100.0)
Platelets: 141 10*3/uL — ABNORMAL LOW (ref 150–400)
Platelets: 134 10*3/uL — ABNORMAL LOW (ref 150–400)
RBC: 3.09 MIL/uL — ABNORMAL LOW (ref 4.22–5.81)
RBC: 3.24 MIL/uL — ABNORMAL LOW (ref 4.22–5.81)
RDW: 12.9 % (ref 11.5–15.5)
RDW: 13.1 % (ref 11.5–15.5)
WBC: 9 10*3/uL (ref 4.0–10.5)
WBC: 9.7 10*3/uL (ref 4.0–10.5)
nRBC: 0 % (ref 0.0–0.2)
nRBC: 0 % (ref 0.0–0.2)

## 2018-07-21 LAB — GLUCOSE, CAPILLARY
Glucose-Capillary: 103 mg/dL — ABNORMAL HIGH (ref 70–99)
Glucose-Capillary: 113 mg/dL — ABNORMAL HIGH (ref 70–99)
Glucose-Capillary: 113 mg/dL — ABNORMAL HIGH (ref 70–99)
Glucose-Capillary: 117 mg/dL — ABNORMAL HIGH (ref 70–99)
Glucose-Capillary: 121 mg/dL — ABNORMAL HIGH (ref 70–99)
Glucose-Capillary: 122 mg/dL — ABNORMAL HIGH (ref 70–99)
Glucose-Capillary: 131 mg/dL — ABNORMAL HIGH (ref 70–99)
Glucose-Capillary: 135 mg/dL — ABNORMAL HIGH (ref 70–99)
Glucose-Capillary: 149 mg/dL — ABNORMAL HIGH (ref 70–99)
Glucose-Capillary: 167 mg/dL — ABNORMAL HIGH (ref 70–99)
Glucose-Capillary: 99 mg/dL (ref 70–99)
Glucose-Capillary: 116 mg/dL — ABNORMAL HIGH (ref 70–99)
Glucose-Capillary: 110 mg/dL — ABNORMAL HIGH (ref 70–99)
Glucose-Capillary: 125 mg/dL — ABNORMAL HIGH (ref 70–99)
Glucose-Capillary: 104 mg/dL — ABNORMAL HIGH (ref 70–99)
Glucose-Capillary: 114 mg/dL — ABNORMAL HIGH (ref 70–99)
Glucose-Capillary: 124 mg/dL — ABNORMAL HIGH (ref 70–99)
Glucose-Capillary: 124 mg/dL — ABNORMAL HIGH (ref 70–99)
Glucose-Capillary: 130 mg/dL — ABNORMAL HIGH (ref 70–99)
Glucose-Capillary: 158 mg/dL — ABNORMAL HIGH (ref 70–99)
Glucose-Capillary: 116 mg/dL — ABNORMAL HIGH (ref 70–99)
Glucose-Capillary: 174 mg/dL — ABNORMAL HIGH (ref 70–99)

## 2018-07-21 LAB — BASIC METABOLIC PANEL
Anion gap: 4 — ABNORMAL LOW (ref 5–15)
BUN: 22 mg/dL (ref 8–23)
CO2: 23 mmol/L (ref 22–32)
Calcium: 8.3 mg/dL — ABNORMAL LOW (ref 8.9–10.3)
Chloride: 111 mmol/L (ref 98–111)
Creatinine, Ser: 1.52 mg/dL — ABNORMAL HIGH (ref 0.61–1.24)
GFR calc Af Amer: 52 mL/min — ABNORMAL LOW (ref >60)
GFR calc non Af Amer: 45 mL/min — ABNORMAL LOW (ref >60)
Glucose, Bld: 108 mg/dL — ABNORMAL HIGH (ref 70–99)
Potassium: 4.4 mmol/L (ref 3.5–5.1)
Sodium: 138 mmol/L (ref 135–145)

## 2018-07-21 LAB — HEMOGLOBIN A1C
Hgb A1c MFr Bld: 7 % — ABNORMAL HIGH (ref 4.8–5.6)
Mean Plasma Glucose: 154 mg/dL

## 2018-07-21 LAB — CREATININE, SERUM
Creatinine, Ser: 1.59 mg/dL — ABNORMAL HIGH (ref 0.61–1.24)
GFR calc Af Amer: 49 mL/min — ABNORMAL LOW (ref >60)
GFR calc non Af Amer: 42 mL/min — ABNORMAL LOW (ref >60)

## 2018-07-21 LAB — MAGNESIUM
Magnesium: 2.1 mg/dL (ref 1.7–2.4)
Magnesium: 2.3 mg/dL (ref 1.7–2.4)

## 2018-07-21 MED ORDER — INSULIN DETEMIR 100 UNIT/ML ~~LOC~~ SOLN
25.0000 [IU] | Freq: Once | SUBCUTANEOUS | Status: AC
  Administered 2018-07-21: 25 [IU] via SUBCUTANEOUS
  Filled 2018-07-21: qty 0.25

## 2018-07-21 MED ORDER — INSULIN DETEMIR 100 UNIT/ML ~~LOC~~ SOLN
25.0000 [IU] | Freq: Every day | SUBCUTANEOUS | Status: DC
  Administered 2018-07-22: 25 [IU] via SUBCUTANEOUS
  Filled 2018-07-21 (×2): qty 0.25

## 2018-07-21 MED ORDER — ENOXAPARIN SODIUM 30 MG/0.3ML ~~LOC~~ SOLN
30.0000 mg | Freq: Every day | SUBCUTANEOUS | Status: DC
  Administered 2018-07-21 – 2018-07-22 (×2): 30 mg via SUBCUTANEOUS
  Filled 2018-07-21 (×2): qty 0.3

## 2018-07-21 MED ORDER — METOCLOPRAMIDE HCL 5 MG/ML IJ SOLN
10.0000 mg | Freq: Three times a day (TID) | INTRAMUSCULAR | Status: AC
  Administered 2018-07-21 (×2): 10 mg via INTRAVENOUS
  Filled 2018-07-21 (×3): qty 2

## 2018-07-21 MED ORDER — ATORVASTATIN CALCIUM 40 MG PO TABS
40.0000 mg | ORAL_TABLET | Freq: Every day | ORAL | Status: DC
  Administered 2018-07-21 – 2018-07-24 (×4): 40 mg via ORAL
  Filled 2018-07-21 (×4): qty 1

## 2018-07-21 MED ORDER — TRAMADOL HCL 50 MG PO TABS
50.0000 mg | ORAL_TABLET | ORAL | Status: DC | PRN
  Administered 2018-07-22 – 2018-07-23 (×2): 50 mg via ORAL
  Filled 2018-07-21 (×2): qty 1

## 2018-07-21 MED ORDER — CHLORHEXIDINE GLUCONATE CLOTH 2 % EX PADS
6.0000 | MEDICATED_PAD | Freq: Every day | CUTANEOUS | Status: DC
  Administered 2018-07-21 – 2018-07-25 (×4): 6 via TOPICAL

## 2018-07-21 MED ORDER — MUPIROCIN 2 % EX OINT
1.0000 "application " | TOPICAL_OINTMENT | Freq: Two times a day (BID) | CUTANEOUS | Status: DC
  Administered 2018-07-21 – 2018-07-24 (×8): 1 via NASAL
  Filled 2018-07-21 (×2): qty 22

## 2018-07-21 MED ORDER — OXYCODONE HCL 5 MG PO TABS
5.0000 mg | ORAL_TABLET | ORAL | Status: DC | PRN
  Administered 2018-07-22 (×3): 5 mg via ORAL
  Filled 2018-07-21 (×3): qty 1

## 2018-07-21 MED ORDER — INSULIN ASPART 100 UNIT/ML ~~LOC~~ SOLN
0.0000 [IU] | SUBCUTANEOUS | Status: DC
  Administered 2018-07-21 (×2): 2 [IU] via SUBCUTANEOUS
  Administered 2018-07-21: 4 [IU] via SUBCUTANEOUS
  Administered 2018-07-22: 2 [IU] via SUBCUTANEOUS
  Administered 2018-07-22: 8 [IU] via SUBCUTANEOUS
  Administered 2018-07-22: 4 [IU] via SUBCUTANEOUS
  Administered 2018-07-22: 2 [IU] via SUBCUTANEOUS

## 2018-07-21 MED FILL — Magnesium Sulfate Inj 50%: INTRAMUSCULAR | Qty: 10 | Status: AC

## 2018-07-21 MED FILL — Heparin Sodium (Porcine) Inj 1000 Unit/ML: INTRAMUSCULAR | Qty: 30 | Status: AC

## 2018-07-21 MED FILL — Potassium Chloride Inj 2 mEq/ML: INTRAVENOUS | Qty: 40 | Status: AC

## 2018-07-21 NOTE — Op Note (Signed)
NAME: Alec [name], Alec [name] MEDICAL RECORD KG:[id number] ACCOUNT [id number] DATE OF BIRTH:June 20, 1946 FACILITY: [facility] LOCATION: [facility] PHYSICIAN:Tiajah Oyster Maryruth Bun, MD  OPERATIVE REPORT  DATE OF PROCEDURE:  07/20/2018  PREOPERATIVE DIAGNOSIS:  Unstable angina with 3-vessel coronary artery disease.  POSTOPERATIVE DIAGNOSES:  Unstable angina with 3-vessel coronary artery disease.  SURGICAL PROCEDURE:  Coronary artery bypass grafting x3 with the left internal mammary to the left anterior descending coronary artery, reverse saphenous vein graft to the intermediate coronary artery, reverse saphenous vein graft to the posterior  descending coronary artery with right greater saphenous endoscopic vein harvesting from the thigh.  SURGEON:  Lanelle Bal, MD  FIRST ASSISTANT:  Nicholes Rough, [place]  BRIEF HISTORY:  The patient is a 72 year old diabetic male who presented in July of 2019 with symptoms of exertional angina.  Over the past several months, these symptoms have progressively worsened.  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  He began having episodes with minimal activity around the house and sometimes at rest.  A stress test was done in October which led to cardiac catheterization by Dr. Ellyn Hack  last week.  The patient has known renal insufficiency, stage III chronic kidney disease.  After the cardiac catheterization, which demonstrated high-grade LAD disease, no significant circumflex vessel was present and right coronary artery disease with  disease in the mid right and just before the takeoff of the posterior descending coronary artery.  Overall, ventricular function was preserved.  Because of the patient's 3-vessel coronary artery disease and progressive symptoms, coronary artery bypass  grafting was recommended.  The patient agreed and signed informed consent.  DESCRIPTION OF PROCEDURE:  With Swan-Ganz and  arterial line monitors in place, the patient underwent general endotracheal anesthesia without incident.  The skin of the chest and legs was prepped with Betadine and draped in the usual sterile manner.   Appropriate timeout was performed, and we proceeded with endoscopic vein harvesting of the right greater saphenous vein in the thigh.  The vein was of excellent quality and caliber.  Median sternotomy was performed, and the left internal mammary artery  was dissected down as a pedicle graft.  The distal artery was divided and had good free flow.  The pericardium was opened.  Overall, ventricular function appeared preserved.  The patient was systemically heparinized.  The ascending aorta was cannulated.   The right atrium was cannulated with a dual-stage venous cannula.  The patient was then placed on cardiopulmonary bypass 2.4 L/min per sq m. Sites of anastomoses were selected and dissected at the epicardium.  Preoperatively, it had been considered if  the patient had an anomalous circumflex that was missed at the time of heart catheterization.  Careful inspection of the lateral wall of the heart did not reveal this to be the case.  The largest of the intermediate vessel was located intramyocardial and  dissected.  Opened the posterior descending vessel.  It was a reasonable size vessel.  The posterior lateral branch was very small.  The patient's body temperature was cooled to 32 degrees.  Aortic cross-clamp was applied, and 500 mL of cold blood  potassium cardioplegia was administered antegrade.  Myocardial septal temperature was monitored throughout the cross-clamp.  We turned our attention first to the posterior descending vessel, which was opened and admitted a 1.5 mm probe.  Using a running  7-0 Prolene, distal anastomosis was performed with a segment of reverse saphenous vein graft.  Additional cold blood cardioplegia was administered down the vein graft.  Heart was then elevated, and the largest  intermediate branch was located  intramyocardial.  The vessel was opened and admitted a 1.5 mm probe.  Using a running 7-0 Prolene, a distal anastomosis was performed.  Attention was then turned to the left anterior descending coronary artery between the mid and distal third of the LAD.   The vessel was opened and admitted a 1.5 mm probe distally.  Using a running 8-0 Prolene, the left internal mammary artery was anastomosed to the left anterior descending coronary artery.  With cross-clamp still in place, 2 punch aortotomies were  performed, and each of the 2 vein grafts were anastomosed to the ascending aorta.  The bulldog on the mammary artery was removed with prompt rise in myocardial septal temperature.  The heart was allowed to passively fill and deair.  The proximal  anastomoses were completed, and aortic cross-clamp was removed with total cross-clamp time of 63 minutes.  Sites of anastomoses were inspected and free of bleeding.  The patient spontaneously converted to a sinus rhythm.  Atrial and ventricular pacing  wires were applied with the patient's body temperature rewarmed to 37 degrees.  He was then ventilated and weaned from cardiopulmonary bypass without difficulty.  He remained hemodynamically stable.  He was decannulated in the usual fashion.  Protamine  sulfate was administered with operative field hemostatic.  Graft markers were applied.  A left pleural tube and a Blake mediastinal drain were left in place.  The pericardium was loosely reapproximated.  The sternum was closed with #6 stainless steel  wire.  Fascia closed with interrupted 0 Vicryl, running 3-0 Vicryl, subcutaneous tissue and 3-0 subcuticular stitch in skin edges.  Dry dressings were applied.  Sponge and needle count was reported as correct at the completion of the procedure.  The  electronic lap counter registered clear.  There was a discrepancy in the instrument count with 1 extra forceps.  Because of this, an x-ray was  obtained in the operating room that did not reveal any foreign bodies.  Total pump time was 87 minutes.  The  patient did not require any blood bank blood products during the operative procedure.  He was then transferred to the surgical intensive care unit for further postoperative care, having tolerated the procedure without obvious complication.  LN/NUANCE  D:07/21/2018 T:07/21/2018 JOB:[id number]

## 2018-07-21 NOTE — Progress Notes (Signed)
Progress Note  Patient Name: Alec [name] Date of Encounter: 07/21/2018  Primary Cardiologist: Minus Breeding, MD   Subjective   Overall progressing well.  Mildly uncomfortable post CABG.  Sitting up in chair.  No shortness of breath.  Inpatient Medications    Scheduled Meds: . acetaminophen  1,000 mg Oral Q6H   Or  . acetaminophen (TYLENOL) oral liquid 160 mg/5 mL  1,000 mg Per Tube Q6H  . aspirin EC  325 mg Oral Daily   Or  . aspirin  324 mg Per Tube Daily  . atorvastatin  40 mg Oral q1800  . bisacodyl  10 mg Oral Daily   Or  . bisacodyl  10 mg Rectal Daily  . buPROPion  150 mg Oral Daily  . Chlorhexidine Gluconate Cloth  6 each Topical Daily  . docusate sodium  200 mg Oral Daily  . enoxaparin (LOVENOX) injection  30 mg Subcutaneous QHS  . folic acid  1 mg Oral QPC supper  . insulin aspart  0-24 Units Subcutaneous Q4H  . insulin detemir  25 Units Subcutaneous Once  . [START ON 07/22/2018] insulin detemir  25 Units Subcutaneous Daily  . insulin regular  0-10 Units Intravenous TID WC  . mouth rinse  15 mL Mouth Rinse BID  . metoCLOPramide (REGLAN) injection  10 mg Intravenous Q8H  . metoprolol tartrate  12.5 mg Oral BID   Or  . metoprolol tartrate  12.5 mg Per Tube BID  . mupirocin ointment  1 application Nasal BID  . [START ON 07/22/2018] pantoprazole  40 mg Oral Daily  . sodium chloride flush  3 mL Intravenous Q12H   Continuous Infusions: . sodium chloride    . sodium chloride    . sodium chloride 10 mL/hr at 07/20/18 1309  . albumin human 12.5 g (07/20/18 1300)  . cefUROXime (ZINACEF)  IV Stopped (07/21/18 0801)  . dexmedetomidine (PRECEDEX) IV infusion Stopped (07/20/18 1513)  . DOPamine 3 mcg/kg/min (07/21/18 1100)  . insulin 2.6 mL/hr at 07/21/18 1100  . lactated ringers    . lactated ringers Stopped (07/20/18 1316)  . lactated ringers 10 mL/hr at 07/21/18 1100  . nitroGLYCERIN Stopped (07/20/18 1317)  . phenylephrine (NEO-SYNEPHRINE) Adult infusion  Stopped (07/21/18 0522)   PRN Meds: sodium chloride, albumin human, lactated ringers, metoprolol tartrate, midazolam, morphine injection, ondansetron (ZOFRAN) IV, oxyCODONE, sodium chloride flush, traMADol   Vital Signs    Vitals:   07/21/18 0800 07/21/18 0900 07/21/18 1000 07/21/18 1100  BP: 119/67 117/75 137/86 126/73  Pulse: 90 89 89 89  Resp: 11 (!) 9 12 (!) 9  Temp: (!) 97.5 F (36.4 C) (!) 97.3 F (36.3 C) (!) 97.3 F (36.3 C)   TempSrc:      SpO2: 96% 95% 96% 96%  Weight:      Height:        Intake/Output Summary (Last 24 hours) at 07/21/2018 1139 Last data filed at 07/21/2018 1100 Gross per 24 hour  Intake 3023.2 ml  Output 2558 ml  Net 465.2 ml   Filed Weights   07/20/18 0554 07/21/18 0500  Weight: 88 kg 93.5 kg    Telemetry    Atrial pacing noted- Personally Reviewed  ECG    Sinus rhythm no other significant changes- Personally Reviewed  Physical Exam   GEN: No acute distress.  Comfortable in chair Neck: No JVD, right IJ Cardiac: RRR, no murmurs, rubs, or gallops.  CABG scar dressed Respiratory: Clear to auscultation bilaterally.  Chest tube GI:  Soft, nontender, non-distended  MS:  Trace edema; No deformity. Neuro:  Nonfocal  Psych: Normal affect   Labs    Chemistry Recent Labs  Lab 07/16/18 0731 07/20/18 0630  07/20/18 1133 07/20/18 1252 07/20/18 1853 07/20/18 1902 07/21/18 0357  NA 139 139   < > 141 142  --  141 138  K 4.1 4.0   < > 4.2 3.7  --  4.3 4.4  CL 109 110   < > 106  --   --  107 111  CO2 24 24  --   --   --   --   --  23  GLUCOSE 86 109*   < > 161* 138*  --  144* 108*  BUN 15 20   < > 20  --   --  22 22  CREATININE 1.50* 1.78*   < > 1.60*  --  1.58* 1.60* 1.52*  CALCIUM 9.0 9.1  --   --   --   --   --  8.3*  PROT  --  6.5  --   --   --   --   --   --   ALBUMIN  --  3.6  --   --   --   --   --   --   AST  --  55*  --   --   --   --   --   --   ALT  --  110*  --   --   --   --   --   --   ALKPHOS  --  59  --   --   --    --   --   --   BILITOT  --  1.0  --   --   --   --   --   --   GFRNONAA 45* 37*  --   --   --  43*  --  45*  GFRAA 53* 43*  --   --   --  49*  --  52*  ANIONGAP 6 5  --   --   --   --   --  4*   < > = values in this interval not displayed.     Hematology Recent Labs  Lab 07/20/18 1252 07/20/18 1853 07/20/18 1902 07/21/18 0357  WBC 6.1 8.0  --  9.0  RBC 3.02* 3.08*  --  3.09*  HGB 9.2*  9.7* 9.9* 8.8* 9.9*  HCT 27.0*  28.5* 28.7* 26.0* 29.3*  MCV 94.4 93.2  --  94.8  MCH 32.1 32.1  --  32.0  MCHC 34.0 34.5  --  33.8  RDW 12.6 12.6  --  12.9  PLT 114* 120*  --  141*    Cardiac EnzymesNo results for input(s): TROPONINI in the last 168 hours. No results for input(s): TROPIPOC in the last 168 hours.   BNPNo results for input(s): BNP, PROBNP in the last 168 hours.   DDimer No results for input(s): DDIMER in the last 168 hours.   Radiology    Dg Chest Port 1 View  Result Date: 07/21/2018 CLINICAL DATA:  Status post coronary artery bypass grafting, status postextubation EXAM: PORTABLE CHEST 1 VIEW COMPARISON:  July 20, 2018 FINDINGS: Endotracheal tube and nasogastric tube have been removed. Chest tube remains on the left. There is a mediastinal drain, unchanged in position. Swan-Ganz catheter tip is in the proximal right main pulmonary artery. No evident  pneumothorax. There is no edema or consolidation. Heart size is upper normal with pulmonary vascularity normal. There is aortic atherosclerosis. Status post coronary artery bypass grafting. No bone lesions. IMPRESSION: Tube and catheter positions as described without pneumothorax. No edema or consolidation. Stable cardiac silhouette. There is aortic atherosclerosis. Aortic Atherosclerosis (ICD10-I70.0). Electronically Signed   By: Lowella Grip III M.D.   On: 07/21/2018 10:01   Dg Chest Port 1 View  Result Date: 07/20/2018 CLINICAL DATA:  Status post coronary bypass grafting EXAM: PORTABLE CHEST 1 VIEW COMPARISON:   07/20/2018 FINDINGS: Cardiac shadow is stable. Endotracheal tube, nasogastric catheter and Swan-Ganz catheter are again seen and stable. The esophageal probe is been removed in the interval. Left thoracostomy catheter is noted without evidence of pneumothorax. Mediastinal drain is seen as well. Lungs are well aerated without focal infiltrate. Mild left perihilar atelectasis is noted. IMPRESSION: Mild left perihilar atelectasis. Tubes and lines as described. Electronically Signed   By: Inez Catalina M.D.   On: 07/20/2018 14:13   Dg Chest Portable 1 View  Result Date: 07/20/2018 CLINICAL DATA:  72 year old male status post CABG. Evaluate for retained foreign bodies given incorrect instrument count. EXAM: PORTABLE CHEST 1 VIEW COMPARISON:  Preoperative chest x-ray 07/16/2018 FINDINGS: The patient is intubated. The tip of the endotracheal tube is 4.4 cm above the carina. A transesophageal ultrasound probe is present. Left-sided thoracostomy tube is present. Mediastinal drain is present. Right IJ vascular sheath convey is a Swan-Ganz catheter into the heart. The tip of the Luiz Blare is well positioned overlying the proximal right main pulmonary artery. Epicardial pacing leads project over the right heart. Patient is status post median sternotomy for multivessel CABG. Surgical clips in the right upper quadrant suggest prior cholecystectomy. Inspiratory volumes are low. No evidence of pneumothorax, pleural effusion or other acute abnormality. IMPRESSION: 1. Coronary artery bypass grafting in progress with expected support apparatus. No unexpected retained radiopaque foreign body. 2. The tip of the endotracheal tube is 4.4 cm above the carina. 3. Right IJ vascular sheath convey is a Swan-Ganz catheter into the heart which overlies the right main pulmonary artery in good position. 4. Mediastinal and left-sided chest tubes. 5. Low inspiratory volumes without evidence of pneumothorax or pleural effusion. Electronically Signed    By: Jacqulynn Cadet M.D.   On: 07/20/2018 12:29    Cardiac Studies   Echo-EF 55-60  Patient Profile     72 y.o. male with severe coronary artery disease status post CABG, chronic kidney disease stage III, hyperlipidemia  Assessment & Plan    Hyperlipidemia -I increased his atorvastatin from 20 mg to 40 mg to initiate high intensity statin dose with his coronary artery disease post CABG.  CAD post CABG -Progressing well.  Up in chair.  Atrial pacing noted.  CKD 3 -Low-dose dopamine for renal protection.  Likely will be discontinued tomorrow.  `     For questions or updates, please contact [place] Please consult [email or web address] for contact info under        Signed, Candee Furbish, MD  07/21/2018, 11:39 AM

## 2018-07-21 NOTE — Anesthesia Postprocedure Evaluation (Signed)
Anesthesia Post Note  Patient: Alec [name]  Procedure(s) Performed: CORONARY ARTERY BYPASS GRAFTING (CABG) times three using left internal mammary artery to the LAD, and using endoscopically harvested right saphenous vein to PDA and intermedius. (N/A Chest) TRANSESOPHAGEAL ECHOCARDIOGRAM (TEE) (N/A )     Patient location during evaluation: SICU Anesthesia Type: General Level of consciousness: sedated Pain management: pain level controlled Vital Signs Assessment: post-procedure vital signs reviewed and stable Respiratory status: patient remains intubated per anesthesia plan Cardiovascular status: stable Postop Assessment: no apparent nausea or vomiting Anesthetic complications: no    Last Vitals:  Vitals:   07/21/18 1100 07/21/18 1200  BP: 126/73 132/73  Pulse: 89 89  Resp: (!) 9 15  Temp:    SpO2: 96% 97%    Last Pain:  Vitals:   07/21/18 1100  TempSrc:   PainSc: [place]

## 2018-07-21 NOTE — Progress Notes (Signed)
Patient ID: Alec [name], male   DOB: Sep 28, 1946, 72 y.o.   MRN: [id number] [facility] Evening Rounds:  Hemodynamically stable in sinus rhythm with frequent single PVC's. On dopamine 3 mcg for elevated creat 1.6 K+ and Mg 2+ levels ok. Urine output ok Chest tube output low. sats 91% on 3L [place].

## 2018-07-21 NOTE — Progress Notes (Signed)
Patient ID: Alec [name], male   DOB: 11/09/1945, 72 y.o.   MRN: [id number] [facility] DAILY ICU PROGRESS NOTE                   [address]            [place] [address]          [phone]   1 Day Post-Op Procedure(s) (LRB): CORONARY ARTERY BYPASS GRAFTING (CABG) times three using left internal mammary artery to the LAD, and using endoscopically harvested right saphenous vein to PDA and intermedius. (N/A) TRANSESOPHAGEAL ECHOCARDIOGRAM (TEE) (N/A)  Total Length of Stay:  LOS: 1 day   Subjective: Patient awake alert neurologically intact, does complain of some mild dizziness  Objective: Vital signs in last 24 hours: Temp:  [95.9 F (35.5 C)-98.2 F (36.8 C)] 97.5 F (36.4 C) (10/22 0650) Pulse Rate:  [61-97] 61 (10/22 0650) Cardiac Rhythm: Atrial paced (10/22 0400) Resp:  [10-21] 15 (10/22 0650) BP: (90-118)/(47-77) 111/69 (10/22 0600) SpO2:  [96 %-100 %] 96 % (10/22 0650) Arterial Line BP: (89-226)/(49-220) 121/57 (10/22 0650) FiO2 (%):  [40 %-50 %] 40 % (10/21 1700) Weight:  [93.5 kg] 93.5 kg (10/22 0500)  Filed Weights   07/20/18 0554 07/21/18 0500  Weight: 88 kg 93.5 kg    Weight change: 5.502 kg   Hemodynamic parameters for last 24 hours: PAP: (24-36)/(13-25) 36/20 CO:  [4.5 L/min-6.1 L/min] 5.4 L/min CI:  [2.3 L/min/m2-3.1 L/min/m2] 2.8 L/min/m2  Intake/Output from previous day: 10/21 0701 - 10/22 0700 In: 5255.3 [P.O.:250; I.V.:3216.9; Blood:372; IV Piggyback:1416.3] Out: 2318 [Urine:1210; Emesis/NG output:50; Blood:750; Chest Tube:308]  Intake/Output this shift: No intake/output data recorded.  Current Meds: Scheduled Meds: . acetaminophen  1,000 mg Oral Q6H   Or  . acetaminophen (TYLENOL) oral liquid 160 mg/5 mL  1,000 mg Per Tube Q6H  . aspirin EC  325 mg Oral Daily   Or  . aspirin  324 mg Per Tube Daily  . atorvastatin  20 mg Oral q1800  . bisacodyl  10 mg Oral Daily   Or  . bisacodyl  10 mg Rectal Daily  . buPROPion  150 mg  Oral Daily  . docusate sodium  200 mg Oral Daily  . folic acid  1 mg Oral QPC supper  . insulin regular  0-10 Units Intravenous TID WC  . mouth rinse  15 mL Mouth Rinse BID  . metoprolol tartrate  12.5 mg Oral BID   Or  . metoprolol tartrate  12.5 mg Per Tube BID  . [START ON 07/22/2018] pantoprazole  40 mg Oral Daily  . sodium chloride flush  3 mL Intravenous Q12H   Continuous Infusions: . sodium chloride    . sodium chloride    . sodium chloride 10 mL/hr at 07/20/18 1309  . albumin human 12.5 g (07/20/18 1300)  . cefUROXime (ZINACEF)  IV Stopped (07/20/18 2016)  . dexmedetomidine (PRECEDEX) IV infusion Stopped (07/20/18 1513)  . DOPamine 3 mcg/kg/min (07/21/18 0700)  . insulin 2 mL/hr at 07/21/18 0700  . lactated ringers    . lactated ringers Stopped (07/20/18 1316)  . lactated ringers 10 mL/hr at 07/21/18 0700  . nitroGLYCERIN Stopped (07/20/18 1317)  . phenylephrine (NEO-SYNEPHRINE) Adult infusion Stopped (07/21/18 0522)   PRN Meds:.sodium chloride, albumin human, lactated ringers, metoprolol tartrate, midazolam, morphine injection, ondansetron (ZOFRAN) IV, oxyCODONE, sodium chloride flush, traMADol  General appearance: alert, cooperative, appears stated age and no distress Neurologic: intact Heart: regular rate and rhythm,  S1, S2 normal, no murmur, click, rub or gallop Lungs: diminished breath sounds bibasilar Abdomen: Abdomen not distended but does have hypoactive bowel sounds Extremities: extremities normal, atraumatic, no cyanosis or edema and Homans sign is negative, no sign of DVT Wound: Sternum intact dressing intact  Lab Results: CBC: Recent Labs    07/20/18 1853 07/20/18 1902 07/21/18 0357  WBC 8.0  --  9.0  HGB 9.9* 8.8* 9.9*  HCT 28.7* 26.0* 29.3*  PLT 120*  --  141*   BMET:  Recent Labs    07/20/18 0630  07/20/18 1902 07/21/18 0357  NA 139   < > 141 138  K 4.0   < > 4.3 4.4  CL 110   < > 107 111  CO2 24  --   --  23  GLUCOSE 109*   < > 144*  108*  BUN 20   < > 22 22  CREATININE 1.78*   < > 1.60* 1.52*  CALCIUM 9.1  --   --  8.3*   < > = values in this interval not displayed.    CMET: Lab Results  Component Value Date   WBC 9.0 07/21/2018   HGB 9.9 (L) 07/21/2018   HCT 29.3 (L) 07/21/2018   PLT 141 (L) 07/21/2018   GLUCOSE 108 (H) 07/21/2018   CHOL 94 07/16/2018   TRIG 120 07/16/2018   HDL 25 (L) 07/16/2018   LDLCALC 45 07/16/2018   ALT 110 (H) 07/20/2018   AST 55 (H) 07/20/2018   NA 138 07/21/2018   K 4.4 07/21/2018   CL 111 07/21/2018   CREATININE 1.52 (H) 07/21/2018   BUN 22 07/21/2018   CO2 23 07/21/2018   TSH 2.520 07/14/2018   INR 1.62 07/20/2018   HGBA1C 7.0 (H) 07/20/2018   MICROALBUR 5.9 (H) 06/16/2018      PT/INR:  Recent Labs    07/20/18 1252  LABPROT 19.0*  INR 1.62   Radiology: Dg Chest Port 1 View  Result Date: 07/20/2018 CLINICAL DATA:  Status post coronary bypass grafting EXAM: PORTABLE CHEST 1 VIEW COMPARISON:  07/20/2018 FINDINGS: Cardiac shadow is stable. Endotracheal tube, nasogastric catheter and Swan-Ganz catheter are again seen and stable. The esophageal probe is been removed in the interval. Left thoracostomy catheter is noted without evidence of pneumothorax. Mediastinal drain is seen as well. Lungs are well aerated without focal infiltrate. Mild left perihilar atelectasis is noted. IMPRESSION: Mild left perihilar atelectasis. Tubes and lines as described. Electronically Signed   By: Inez Catalina M.D.   On: 07/20/2018 14:13   Dg Chest Portable 1 View  Result Date: 07/20/2018 CLINICAL DATA:  72 year old male status post CABG. Evaluate for retained foreign bodies given incorrect instrument count. EXAM: PORTABLE CHEST 1 VIEW COMPARISON:  Preoperative chest x-ray 07/16/2018 FINDINGS: The patient is intubated. The tip of the endotracheal tube is 4.4 cm above the carina. A transesophageal ultrasound probe is present. Left-sided thoracostomy tube is present. Mediastinal drain is present.  Right IJ vascular sheath convey is a Swan-Ganz catheter into the heart. The tip of the Luiz Blare is well positioned overlying the proximal right main pulmonary artery. Epicardial pacing leads project over the right heart. Patient is status post median sternotomy for multivessel CABG. Surgical clips in the right upper quadrant suggest prior cholecystectomy. Inspiratory volumes are low. No evidence of pneumothorax, pleural effusion or other acute abnormality. IMPRESSION: 1. Coronary artery bypass grafting in progress with expected support apparatus. No unexpected retained radiopaque foreign body. 2. The tip of  the endotracheal tube is 4.4 cm above the carina. 3. Right IJ vascular sheath convey is a Swan-Ganz catheter into the heart which overlies the right main pulmonary artery in good position. 4. Mediastinal and left-sided chest tubes. 5. Low inspiratory volumes without evidence of pneumothorax or pleural effusion. Electronically Signed   By: Jacqulynn Cadet M.D.   On: 07/20/2018 12:29     Assessment/Plan: S/P Procedure(s) (LRB): CORONARY ARTERY BYPASS GRAFTING (CABG) times three using left internal mammary artery to the LAD, and using endoscopically harvested right saphenous vein to PDA and intermedius. (N/A) TRANSESOPHAGEAL ECHOCARDIOGRAM (TEE) (N/A) Mobilize Diuresis See progression orders Expected Acute  Blood - loss Anemia- continue to monitor  Stage IIIB CKD present preop Continue low-dose dopamine, monitor renal function closely Transition off insulin drip, wait to start patient's home every 48 hour disposable insulin pump until closer to discharge and p.o. diet settled Continue to monitor renal function  Grace Isaac 07/21/2018 7:01 AM

## 2018-07-22 ENCOUNTER — Inpatient Hospital Stay (HOSPITAL_COMMUNITY): Payer: Medicare Other

## 2018-07-22 LAB — CBC
HCT: 29.3 % — ABNORMAL LOW (ref 39.0–52.0)
Hemoglobin: 9.9 g/dL — ABNORMAL LOW (ref 13.0–17.0)
MCH: 32.8 pg (ref 26.0–34.0)
MCHC: 33.8 g/dL (ref 30.0–36.0)
MCV: 97 fL (ref 80.0–100.0)
Platelets: 121 10*3/uL — ABNORMAL LOW (ref 150–400)
RBC: 3.02 MIL/uL — ABNORMAL LOW (ref 4.22–5.81)
RDW: 13 % (ref 11.5–15.5)
WBC: 7.3 10*3/uL (ref 4.0–10.5)
nRBC: 0 % (ref 0.0–0.2)

## 2018-07-22 LAB — BASIC METABOLIC PANEL
Anion gap: 5 (ref 5–15)
BUN: 23 mg/dL (ref 8–23)
CO2: 24 mmol/L (ref 22–32)
Calcium: 8.4 mg/dL — ABNORMAL LOW (ref 8.9–10.3)
Chloride: 108 mmol/L (ref 98–111)
Creatinine, Ser: 1.52 mg/dL — ABNORMAL HIGH (ref 0.61–1.24)
GFR calc Af Amer: 52 mL/min — ABNORMAL LOW (ref >60)
GFR calc non Af Amer: 45 mL/min — ABNORMAL LOW (ref >60)
Glucose, Bld: 161 mg/dL — ABNORMAL HIGH (ref 70–99)
Potassium: 4 mmol/L (ref 3.5–5.1)
Sodium: 137 mmol/L (ref 135–145)

## 2018-07-22 LAB — GLUCOSE, CAPILLARY
Glucose-Capillary: 168 mg/dL — ABNORMAL HIGH (ref 70–99)
Glucose-Capillary: 139 mg/dL — ABNORMAL HIGH (ref 70–99)
Glucose-Capillary: 211 mg/dL — ABNORMAL HIGH (ref 70–99)
Glucose-Capillary: 181 mg/dL — ABNORMAL HIGH (ref 70–99)
Glucose-Capillary: 176 mg/dL — ABNORMAL HIGH (ref 70–99)

## 2018-07-22 MED ORDER — FUROSEMIDE 10 MG/ML IJ SOLN
20.0000 mg | Freq: Once | INTRAMUSCULAR | Status: AC
  Administered 2018-07-22: 20 mg via INTRAVENOUS
  Filled 2018-07-22: qty 2

## 2018-07-22 MED ORDER — INSULIN ASPART 100 UNIT/ML ~~LOC~~ SOLN: 0.0000 [IU] | Freq: Three times a day (TID) | SUBCUTANEOUS | Status: DC

## 2018-07-22 MED ORDER — SORBITOL 70 % PO SOLN
15.0000 mL | Freq: Once | ORAL | Status: DC
  Filled 2018-07-22: qty 15

## 2018-07-22 MED ORDER — INSULIN ASPART 100 UNIT/ML ~~LOC~~ SOLN
0.0000 [IU] | Freq: Three times a day (TID) | SUBCUTANEOUS | Status: DC
  Administered 2018-07-22 (×2): 4 [IU] via SUBCUTANEOUS
  Administered 2018-07-23: 8 [IU] via SUBCUTANEOUS
  Administered 2018-07-23: 12 [IU] via SUBCUTANEOUS

## 2018-07-22 MED FILL — Electrolyte-R (PH 7.4) Solution: INTRAVENOUS | Qty: 3000 | Status: AC

## 2018-07-22 MED FILL — Sodium Chloride IV Soln 0.9%: INTRAVENOUS | Qty: 2000 | Status: AC

## 2018-07-22 MED FILL — Heparin Sodium (Porcine) Inj 1000 Unit/ML: INTRAMUSCULAR | Qty: 10 | Status: AC

## 2018-07-22 MED FILL — Lidocaine HCl(Cardiac) IV PF Soln Pref Syr 100 MG/5ML (2%): INTRAVENOUS | Qty: 5 | Status: AC

## 2018-07-22 MED FILL — Sodium Bicarbonate IV Soln 8.4%: INTRAVENOUS | Qty: 50 | Status: AC

## 2018-07-22 NOTE — Progress Notes (Signed)
CT surgery p.m. Rounds  Patient resting comfortably Maintaining sinus rhythm off pacemaker Lasix ordered for postop fluid retention

## 2018-07-22 NOTE — Progress Notes (Addendum)
[facility] DAILY ICU PROGRESS NOTE                   [address]            [place] [address]          [phone]   2 Days Post-Op Procedure(s) (LRB): CORONARY ARTERY BYPASS GRAFTING (CABG) times three using left internal mammary artery to the LAD, and using endoscopically harvested right saphenous vein to PDA and intermedius. (N/A) TRANSESOPHAGEAL ECHOCARDIOGRAM (TEE) (N/A)  Total Length of Stay:  LOS: 2 days   Subjective: He had a much better night last night. His pain is well controlled this morning.   Objective: Vital signs in last 24 hours: Temp:  [97.3 F (36.3 C)-98.7 F (37.1 C)] 98.5 F (36.9 C) (10/23 0400) Pulse Rate:  [79-90] 79 (10/23 0700) Cardiac Rhythm: Atrial paced (10/23 0400) Resp:  [9-21] 10 (10/23 0700) BP: (109-144)/(64-86) 129/66 (10/23 0700) SpO2:  [90 %-100 %] 100 % (10/23 0700) Arterial Line BP: (107-152)/(51-69) 143/67 (10/22 1400) Weight:  [92.5 kg] 92.5 kg (10/23 0500)  Filed Weights   07/20/18 0554 07/21/18 0500 07/22/18 0500  Weight: 88 kg 93.5 kg 92.5 kg    Weight change: -1 kg   Hemodynamic parameters for last 24 hours: PAP: (32-44)/(18-29) 44/29  Intake/Output from previous day: 10/22 0701 - 10/23 0700 In: 438.7 [I.V.:238.9; IV Piggyback:199.9] Out: 1715 [Urine:1445; Chest Tube:270]  Intake/Output this shift: No intake/output data recorded.  Current Meds: Scheduled Meds: . acetaminophen  1,000 mg Oral Q6H   Or  . acetaminophen (TYLENOL) oral liquid 160 mg/5 mL  1,000 mg Per Tube Q6H  . aspirin EC  325 mg Oral Daily   Or  . aspirin  324 mg Per Tube Daily  . atorvastatin  40 mg Oral q1800  . bisacodyl  10 mg Oral Daily   Or  . bisacodyl  10 mg Rectal Daily  . buPROPion  150 mg Oral Daily  . Chlorhexidine Gluconate Cloth  6 each Topical Daily  . docusate sodium  200 mg Oral Daily  . enoxaparin (LOVENOX) injection  30 mg Subcutaneous QHS  . folic acid  1 mg Oral QPC supper  . insulin aspart  0-24 Units  Subcutaneous Q4H  . insulin detemir  25 Units Subcutaneous Daily  . insulin regular  0-10 Units Intravenous TID WC  . mouth rinse  15 mL Mouth Rinse BID  . metoprolol tartrate  12.5 mg Oral BID   Or  . metoprolol tartrate  12.5 mg Per Tube BID  . mupirocin ointment  1 application Nasal BID  . pantoprazole  40 mg Oral Daily  . sodium chloride flush  3 mL Intravenous Q12H   Continuous Infusions: . sodium chloride    . sodium chloride    . sodium chloride 10 mL/hr at 07/20/18 1309  . cefUROXime (ZINACEF)  IV Stopped (07/21/18 2035)  . dexmedetomidine (PRECEDEX) IV infusion Stopped (07/20/18 1513)  . DOPamine 3 mcg/kg/min (07/22/18 0200)  . insulin Stopped (07/21/18 1403)  . lactated ringers    . lactated ringers Stopped (07/20/18 1316)  . lactated ringers Stopped (07/21/18 2005)  . nitroGLYCERIN Stopped (07/20/18 1317)  . phenylephrine (NEO-SYNEPHRINE) Adult infusion Stopped (07/21/18 0522)   PRN Meds:.sodium chloride, lactated ringers, metoprolol tartrate, ondansetron (ZOFRAN) IV, oxyCODONE, sodium chloride flush, traMADol  General appearance: alert, cooperative and no distress Heart: regular rate and rhythm, S1, S2 normal, no murmur, click, rub or gallop Lungs: clear to auscultation bilaterally  Abdomen: soft, non-tender; bowel sounds normal; no masses,  no organomegaly Extremities: extremities normal, atraumatic, no cyanosis or edema Wound: clean and dry dressed with a sterile dressing  Lab Results: CBC: Recent Labs    07/21/18 1602 07/21/18 1612 07/22/18 0456  WBC 9.7  --  7.3  HGB 10.3* 9.9* 9.9*  HCT 31.4* 29.0* 29.3*  PLT 134*  --  121*   BMET:  Recent Labs    07/21/18 0357  07/21/18 1612 07/22/18 0456  NA 138  --  139 137  K 4.4  --  4.1 4.0  CL 111  --  105 108  CO2 23  --   --  24  GLUCOSE 108*  --  158* 161*  BUN 22  --  24* 23  CREATININE 1.52*   < > 1.60* 1.52*  CALCIUM 8.3*  --   --  8.4*   < > = values in this interval not displayed.     CMET: Lab Results  Component Value Date   WBC 7.3 07/22/2018   HGB 9.9 (L) 07/22/2018   HCT 29.3 (L) 07/22/2018   PLT 121 (L) 07/22/2018   GLUCOSE 161 (H) 07/22/2018   CHOL 94 07/16/2018   TRIG 120 07/16/2018   HDL 25 (L) 07/16/2018   LDLCALC 45 07/16/2018   ALT 110 (H) 07/20/2018   AST 55 (H) 07/20/2018   NA 137 07/22/2018   K 4.0 07/22/2018   CL 108 07/22/2018   CREATININE 1.52 (H) 07/22/2018   BUN 23 07/22/2018   CO2 24 07/22/2018   TSH 2.520 07/14/2018   INR 1.62 07/20/2018   HGBA1C 7.0 (H) 07/20/2018   MICROALBUR 5.9 (H) 06/16/2018      PT/INR:  Recent Labs    07/20/18 1252  LABPROT 19.0*  INR 1.62   Radiology: No results found.   Assessment/Plan: S/P Procedure(s) (LRB): CORONARY ARTERY BYPASS GRAFTING (CABG) times three using left internal mammary artery to the LAD, and using endoscopically harvested right saphenous vein to PDA and intermedius. (N/A) TRANSESOPHAGEAL ECHOCARDIOGRAM (TEE) (N/A)   1. CV-off drips. Discontinue dopamine gtt. NSR in the 70s under pacer. BP stable. Continue ASA and statin. 2. Pulm-tolerating 3L [place]. Encouraged incentive spirometer. CXR looks good this morning. No pleural effusion or pneumothorax. Mediastinal chest tubes removed-two pleural chest tubes remain. Output was 270cc-keep for now.  3. Renal-creatinine 1.52, trending down. Remains about 4kg over baseline weight. Not on lasix-autodiuresing  4. H and H stable at 9.9/29.3, expected acute blood loss anemia 5. Endo-blood glucose well controlled on current regimen 6. Continue lovenox for DVT prophylaxis  Plan: Clears this morning but advance diet if tolerating. Work on weaning oxygen supplementation. Ambulate in the halls-he already ambulated once this morning. OOB to chair. Can probably turn pacer to back-up rate.   Elgie Collard 07/22/2018 7:37 AM  pacer at [facility]VI DC dopamine, chest tube Observe in ICU patient examined and medical record reviewed,agree with above  note. Tharon Aquas Trigt III 07/22/2018

## 2018-07-22 NOTE — Progress Notes (Signed)
   Records reviewed.  Progressing well.  No new recommendations. Candee Furbish, MD

## 2018-07-23 ENCOUNTER — Inpatient Hospital Stay (HOSPITAL_COMMUNITY): Payer: Medicare Other

## 2018-07-23 LAB — TYPE AND SCREEN
ABO/RH(D): O POS
Antibody Screen: NEGATIVE
Unit division: 0
Unit division: 0
Unit division: 0
Unit division: 0

## 2018-07-23 LAB — BPAM RBC
Blood Product Expiration Date: 201911212359
Blood Product Expiration Date: 201911212359
Blood Product Expiration Date: 201911212359
Blood Product Expiration Date: 201911212359
ISSUE DATE / TIME: 201910210831
ISSUE DATE / TIME: 201910210831
Unit Type and Rh: 5100
Unit Type and Rh: 5100
Unit Type and Rh: 5100
Unit Type and Rh: 5100

## 2018-07-23 LAB — GLUCOSE, CAPILLARY
Glucose-Capillary: 139 mg/dL — ABNORMAL HIGH (ref 70–99)
Glucose-Capillary: 208 mg/dL — ABNORMAL HIGH (ref 70–99)
Glucose-Capillary: 274 mg/dL — ABNORMAL HIGH (ref 70–99)
Glucose-Capillary: 298 mg/dL — ABNORMAL HIGH (ref 70–99)
Glucose-Capillary: 201 mg/dL — ABNORMAL HIGH (ref 70–99)
Glucose-Capillary: 161 mg/dL — ABNORMAL HIGH (ref 70–99)
Glucose-Capillary: 202 mg/dL — ABNORMAL HIGH (ref 70–99)
Glucose-Capillary: 153 mg/dL — ABNORMAL HIGH (ref 70–99)

## 2018-07-23 LAB — COMPREHENSIVE METABOLIC PANEL
ALT: 49 U/L — ABNORMAL HIGH (ref 0–44)
AST: 26 U/L (ref 15–41)
Albumin: 3.2 g/dL — ABNORMAL LOW (ref 3.5–5.0)
Alkaline Phosphatase: 53 U/L (ref 38–126)
Anion gap: 7 (ref 5–15)
BUN: 22 mg/dL (ref 8–23)
CO2: 25 mmol/L (ref 22–32)
Calcium: 8.8 mg/dL — ABNORMAL LOW (ref 8.9–10.3)
Chloride: 104 mmol/L (ref 98–111)
Creatinine, Ser: 1.62 mg/dL — ABNORMAL HIGH (ref 0.61–1.24)
GFR calc Af Amer: 48 mL/min — ABNORMAL LOW (ref >60)
GFR calc non Af Amer: 41 mL/min — ABNORMAL LOW (ref >60)
Glucose, Bld: 165 mg/dL — ABNORMAL HIGH (ref 70–99)
Potassium: 4.1 mmol/L (ref 3.5–5.1)
Sodium: 136 mmol/L (ref 135–145)
Total Bilirubin: 1 mg/dL (ref 0.3–1.2)
Total Protein: 5.9 g/dL — ABNORMAL LOW (ref 6.5–8.1)

## 2018-07-23 LAB — CBC
HCT: 30.2 % — ABNORMAL LOW (ref 39.0–52.0)
Hemoglobin: 10.3 g/dL — ABNORMAL LOW (ref 13.0–17.0)
MCH: 32.6 pg (ref 26.0–34.0)
MCHC: 34.1 g/dL (ref 30.0–36.0)
MCV: 95.6 fL (ref 80.0–100.0)
Platelets: 125 10*3/uL — ABNORMAL LOW (ref 150–400)
RBC: 3.16 MIL/uL — ABNORMAL LOW (ref 4.22–5.81)
RDW: 13.4 % (ref 11.5–15.5)
WBC: 6.6 10*3/uL (ref 4.0–10.5)
nRBC: 0 % (ref 0.0–0.2)

## 2018-07-23 MED ORDER — FUROSEMIDE 40 MG PO TABS
40.0000 mg | ORAL_TABLET | Freq: Every day | ORAL | Status: AC
  Administered 2018-07-23 – 2018-07-25 (×3): 40 mg via ORAL
  Filled 2018-07-23 (×3): qty 1

## 2018-07-23 MED ORDER — ONDANSETRON HCL 4 MG/2ML IJ SOLN: 4.0000 mg | Freq: Four times a day (QID) | INTRAMUSCULAR | Status: DC | PRN

## 2018-07-23 MED ORDER — INSULIN DETEMIR 100 UNIT/ML ~~LOC~~ SOLN
35.0000 [IU] | Freq: Every day | SUBCUTANEOUS | Status: DC
  Administered 2018-07-23 – 2018-07-24 (×2): 35 [IU] via SUBCUTANEOUS
  Filled 2018-07-23 (×2): qty 0.35

## 2018-07-23 MED ORDER — OXYCODONE HCL 5 MG PO TABS: 5.0000 mg | ORAL_TABLET | ORAL | Status: DC | PRN

## 2018-07-23 MED ORDER — BISACODYL 10 MG RE SUPP: 10.0000 mg | Freq: Every day | RECTAL | Status: DC | PRN

## 2018-07-23 MED ORDER — ONDANSETRON HCL 4 MG PO TABS: 4.0000 mg | ORAL_TABLET | Freq: Four times a day (QID) | ORAL | Status: DC | PRN

## 2018-07-23 MED ORDER — METOPROLOL TARTRATE 25 MG/10 ML ORAL SUSPENSION: 25.0000 mg | Freq: Two times a day (BID) | ORAL | Status: DC

## 2018-07-23 MED ORDER — SODIUM CHLORIDE 0.9% FLUSH
3.0000 mL | Freq: Two times a day (BID) | INTRAVENOUS | Status: DC
  Administered 2018-07-23 – 2018-07-25 (×5): 3 mL via INTRAVENOUS

## 2018-07-23 MED ORDER — MOVING RIGHT ALONG BOOK
Freq: Once | Status: AC
  Administered 2018-07-23: 15:00:00
  Filled 2018-07-23: qty 1

## 2018-07-23 MED ORDER — METOPROLOL TARTRATE 25 MG PO TABS
25.0000 mg | ORAL_TABLET | Freq: Two times a day (BID) | ORAL | Status: DC
  Administered 2018-07-23 – 2018-07-25 (×4): 25 mg via ORAL
  Filled 2018-07-23 (×4): qty 1

## 2018-07-23 MED ORDER — TRAMADOL HCL 50 MG PO TABS: 50.0000 mg | ORAL_TABLET | ORAL | Status: DC | PRN

## 2018-07-23 MED ORDER — ENOXAPARIN SODIUM 40 MG/0.4ML ~~LOC~~ SOLN
40.0000 mg | Freq: Every day | SUBCUTANEOUS | Status: DC
  Administered 2018-07-23 – 2018-07-24 (×2): 40 mg via SUBCUTANEOUS
  Filled 2018-07-23 (×2): qty 0.4

## 2018-07-23 MED ORDER — ACETAMINOPHEN 325 MG PO TABS: 650.0000 mg | ORAL_TABLET | Freq: Four times a day (QID) | ORAL | Status: DC | PRN

## 2018-07-23 MED ORDER — PANTOPRAZOLE SODIUM 40 MG PO TBEC
40.0000 mg | DELAYED_RELEASE_TABLET | Freq: Every day | ORAL | Status: DC
  Administered 2018-07-25: 40 mg via ORAL
  Filled 2018-07-23 (×2): qty 1

## 2018-07-23 MED ORDER — ALLOPURINOL 100 MG PO TABS
200.0000 mg | ORAL_TABLET | Freq: Every day | ORAL | Status: DC
  Administered 2018-07-23 – 2018-07-24 (×2): 200 mg via ORAL
  Filled 2018-07-23 (×2): qty 2

## 2018-07-23 MED ORDER — SODIUM CHLORIDE 0.9% FLUSH: 3.0000 mL | INTRAVENOUS | Status: DC | PRN

## 2018-07-23 MED ORDER — ASPIRIN EC 325 MG PO TBEC
325.0000 mg | DELAYED_RELEASE_TABLET | Freq: Every day | ORAL | Status: DC
  Administered 2018-07-24 – 2018-07-25 (×2): 325 mg via ORAL
  Filled 2018-07-23 (×2): qty 1

## 2018-07-23 MED ORDER — BISACODYL 5 MG PO TBEC: 10.0000 mg | DELAYED_RELEASE_TABLET | Freq: Every day | ORAL | Status: DC | PRN

## 2018-07-23 MED ORDER — METOPROLOL TARTRATE 25 MG PO TABS
25.0000 mg | ORAL_TABLET | Freq: Two times a day (BID) | ORAL | Status: DC
  Administered 2018-07-23: 25 mg via ORAL
  Filled 2018-07-23: qty 1

## 2018-07-23 MED ORDER — DOCUSATE SODIUM 100 MG PO CAPS
200.0000 mg | ORAL_CAPSULE | Freq: Every day | ORAL | Status: DC
  Administered 2018-07-23: 200 mg via ORAL
  Administered 2018-07-24: 100 mg via ORAL
  Administered 2018-07-25: 200 mg via ORAL
  Filled 2018-07-23 (×3): qty 2

## 2018-07-23 MED ORDER — INSULIN ASPART 100 UNIT/ML ~~LOC~~ SOLN
0.0000 [IU] | Freq: Three times a day (TID) | SUBCUTANEOUS | Status: DC
  Administered 2018-07-23: 2 [IU] via SUBCUTANEOUS
  Administered 2018-07-24: 12 [IU] via SUBCUTANEOUS
  Administered 2018-07-24: 2 [IU] via SUBCUTANEOUS
  Administered 2018-07-24: 8 [IU] via SUBCUTANEOUS
  Administered 2018-07-24 – 2018-07-25 (×2): 2 [IU] via SUBCUTANEOUS

## 2018-07-23 MED ORDER — POTASSIUM CHLORIDE CRYS ER 20 MEQ PO TBCR
20.0000 meq | EXTENDED_RELEASE_TABLET | Freq: Every day | ORAL | Status: AC
  Administered 2018-07-23 – 2018-07-25 (×3): 20 meq via ORAL
  Filled 2018-07-23 (×3): qty 1

## 2018-07-23 MED ORDER — SODIUM CHLORIDE 0.9 % IV SOLN: 250.0000 mL | INTRAVENOUS | Status: DC | PRN

## 2018-07-23 NOTE — Progress Notes (Signed)
3 Days Post-Op Procedure(s) (LRB): CORONARY ARTERY BYPASS GRAFTING (CABG) times three using left internal mammary artery to the LAD, and using endoscopically harvested right saphenous vein to PDA and intermedius. (N/A) TRANSESOPHAGEAL ECHOCARDIOGRAM (TEE) (N/A) Subjective:  Feels better today. Walked 3 times already.  Objective: Vital signs in last 24 hours: Temp:  [98.1 F (36.7 C)-99.3 F (37.4 C)] 98.8 F (37.1 C) (10/24 0400) Pulse Rate:  [64-94] 74 (10/24 0600) Cardiac Rhythm: Normal sinus rhythm (10/24 0400) Resp:  [13-25] 14 (10/24 0600) BP: (116-175)/(47-131) 141/64 (10/24 0600) SpO2:  [90 %-97 %] 93 % (10/24 0600) Weight:  [91 kg] 91 kg (10/24 0400)  Hemodynamic parameters for last 24 hours:    Intake/Output from previous day: 10/23 0701 - 10/24 0700 In: 964.9 [P.O.:960; I.V.:4.9] Out: 775 [Urine:775] Intake/Output this shift: No intake/output data recorded.  General appearance: alert and cooperative Neurologic: intact Heart: regular rate and rhythm, S1, S2 normal, no murmur, click, rub or gallop Lungs: clear to auscultation bilaterally Extremities: edema minimal Wound: dressing dry.  Lab Results: Recent Labs    07/22/18 0456 07/23/18 0232  WBC 7.3 6.6  HGB 9.9* 10.3*  HCT 29.3* 30.2*  PLT 121* 125*   BMET:  Recent Labs    07/22/18 0456 07/23/18 0232  NA 137 136  K 4.0 4.1  CL 108 104  CO2 24 25  GLUCOSE 161* 165*  BUN 23 22  CREATININE 1.52* 1.62*  CALCIUM 8.4* 8.8*    PT/INR:  Recent Labs    07/20/18 1252  LABPROT 19.0*  INR 1.62   ABG    Component Value Date/Time   PHART 7.355 07/20/2018 1835   HCO3 21.6 07/20/2018 1835   TCO2 23 07/21/2018 1612   ACIDBASEDEF 4.0 (H) 07/20/2018 1835   O2SAT 95.0 07/20/2018 1835   CBG (last 3)  Recent Labs    07/22/18 1556 07/22/18 2155 07/23/18 0657  GLUCAP 181* 176* 208*   CXR: clear  Assessment/Plan: S/P Procedure(s) (LRB): CORONARY ARTERY BYPASS GRAFTING (CABG) times three using  left internal mammary artery to the LAD, and using endoscopically harvested right saphenous vein to PDA and intermedius. (N/A) TRANSESOPHAGEAL ECHOCARDIOGRAM (TEE) (N/A)  POD 3 Hemodynamically stable in sinus rhythm. Normal LV systolic function preop. Continue Lopressor. Mild volume excess: weight is 6 lbs over preop. Continue diuretic. Stage 3 CKD with baseline creat 1.5-1.6. He is back at baseline. DM: glucose still a little high. Will increase Levemir.  Transfer to 4E and continue IS, ambulation.   LOS: 3 days    Gaye Pollack 07/23/2018

## 2018-07-23 NOTE — Discharge Summary (Signed)
[address]       [place] [address]             [phone]      Physician Discharge Summary  Patient ID: Alec [name] MRN: [id number] DOB/AGE: 70-23-1947 70 y.o.  Admit date: 07/20/2018 Discharge date: 07/25/2018  Admission Diagnoses: Patient Active Problem List   Diagnosis Date Noted  . Coronary artery disease involving native coronary artery with angina pectoris ([place]) 07/15/2018  . Angina, class II ([place]) 07/14/2018  . Abnormal stress electrocardiography using treadmill 07/14/2018  . Cellulitis of left arm 12/10/2017  . Cellulitis 12/09/2017  . Hyperlipidemia LDL goal <100 04/17/2017  . Diabetic nephropathy associated with type 2 diabetes mellitus ([place]) 12/14/2014  . Essential hypertension, benign 12/14/2014  . IDDM (insulin dependent diabetes mellitus) ([place]) 08/29/2012  . HTN (hypertension) 08/29/2012  . Dyslipidemia 08/29/2012  . Abdominal pain 08/29/2012    Discharge Diagnoses:  Active Problems:   S/P CABG x 3   Discharged Condition: good  HPI:   This is a 72 year old Caucasian male with a past medical history of hyperlipidemia, hypertension, diabetes mellitus, and remote tobacco abuse who was initially seen by cardiology in July for complaints of chest discomfort. This happened with activity, at times radiated to neck/jaw, sometimes associated with diaphoresis, and was also associated with shortness of breath. The stress test was not arranged in July, patient notes that because of continued symptoms were increasing in frequency and severity he called the cardiology office and arranged for a stress test to be done.  his most recent episode occurred yesterday.  Stress test done last week showed abnormal with ST depression in II, III, an aVF leads. There was also T-wave inversion during stress test. These changes evolved into T-wave inversion during the recovery phase.He presented to [facility] in order to undergo a cardiac catheterization.  Results showed LVEF 50-55%, mid left main to proximal LAD 50% stenosis, proximal LAD to mid LAD 80% stenosis, mid RCA 95% stenosis.  Currently, wife is at his bedside. Patient is in no acute distress, denies chest pain. He does have some shortness of breath, however.  Patient currently employed as a Administrator, daily halls.  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  He also noted increasing chest pain while using a push mower recently.  Hospital Course:   On 07/20/2018 Alec [name] underwent a coronary bypass grafting x3 with Dr. Servando Snare.  He tolerated the procedure well and was transferred to the surgical ICU.  He was extubated timely manner.  Postop day 1 he did have some expected acute blood loss anemia.  He does have stage IIIb chronic kidney disease present on preop.  We trended his creatinine closely.  We continued him on low-dose dopamine.  We consulted cardiology for assistance.  Postop day 2 he was off all drips.  We discontinued his dopamine.  He was in normal sinus rhythm in the 70s.  We were weaning his oxygen requirements as tolerated.  We were able to discontinue his remaining chest tubes.  We advanced his diet as tolerated.  We encouraged ambulation around the unit.  Postop day 3 he was hemodynamically stable and a normal sinus rhythm.  He continued Lopressor for heart rate and blood pressure control.  We continued his diuretic regimen due to mild volume overload.  His creatinine was back at his baseline which is 1.5-1.6.  He  was stable to transfer to 4 E for continued care. His epicardial pacing wires were discontinued on 07/24/2018.  He was ambulating around the unit with limited assistance, tolerating room air, his incision was healing well, and he is ready for discharge home.  Consults: cardiology  Significant Diagnostic Studies:  CLINICAL DATA:  Status post coronary artery bypass graft.  EXAM: PORTABLE CHEST 1  VIEW  COMPARISON:  Radiograph of July 22, 2018.  FINDINGS: Stable cardiomediastinal silhouette. Status post coronary artery bypass graft. No pneumothorax is noted. No significant pleural effusion is noted. Minimal subsegmental atelectasis is noted in the left upper lobe in right midlung. Bony thorax is unremarkable.  IMPRESSION: Minimal bilateral subsegmental atelectasis.   Electronically Signed   By: Marijo Conception, M.D.   On: 07/23/2018 10:36   Treatments:   CLINICAL DATA:  Status post coronary artery bypass graft.  EXAM: PORTABLE CHEST 1 VIEW  COMPARISON:  Radiograph of July 22, 2018.  FINDINGS: Stable cardiomediastinal silhouette. Status post coronary artery bypass graft. No pneumothorax is noted. No significant pleural effusion is noted. Minimal subsegmental atelectasis is noted in the left upper lobe in right midlung. Bony thorax is unremarkable.  IMPRESSION: Minimal bilateral subsegmental atelectasis.   Electronically Signed   By: Marijo Conception, M.D.   On: 07/23/2018 10:36  Discharge Exam: Blood pressure 138/76, pulse 76, temperature 98.8 F (37.1 C), temperature source Oral, resp. rate (!) 23, height 5\' 6"  (1.676 m), weight 87.4 kg, SpO2 97 %.   General appearance: alert, cooperative and no distress Heart: regular rate and rhythm, S1, S2 normal, no murmur, click, rub or gallop Lungs: clear to auscultation bilaterally Abdomen: soft, non-tender; bowel sounds normal; no masses,  no organomegaly Extremities: extremities normal, atraumatic, no cyanosis or edema Wound: clean and dry  Disposition: Discharge disposition: 01-Home or Self Care       Discharge Instructions    Amb Referral to Cardiac Rehabilitation   Complete by:  As directed    Diagnosis:  CABG   CABG X ___:  3     Allergies as of 07/25/2018   No Known Allergies     Medication List    STOP taking these medications   amLODipine 10 MG tablet Commonly known as:   NORVASC   aspirin 81 MG tablet Replaced by:  aspirin 325 MG EC tablet   isosorbide mononitrate 30 MG 24 hr tablet Commonly known as:  IMDUR   lisinopril-hydrochlorothiazide 20-12.5 MG tablet Commonly known as:  PRINZIDE,ZESTORETIC   methotrexate 2.5 MG tablet Commonly known as:  RHEUMATREX   OVER THE COUNTER MEDICATION   rOPINIRole 0.5 MG tablet Commonly known as:  REQUIP     TAKE these medications   acetaminophen 325 MG tablet Commonly known as:  TYLENOL Take 2 tablets (650 mg total) by mouth every 6 (six) hours as needed for mild pain.   ADVANCED DIABETIC MULTIVITAMIN Tabs Take 2 tablets by mouth daily after supper.   allopurinol 100 MG tablet Commonly known as:  ZYLOPRIM Take 200 mg by mouth daily after supper.   aspirin 325 MG EC tablet Take 1 tablet (325 mg total) by mouth daily. Replaces:  aspirin 81 MG tablet   atorvastatin 40 MG tablet Commonly known as:  LIPITOR Take 1 tablet (40 mg total) by mouth daily at 6 PM. What changed:    medication strength  how much to take  when to take this   buPROPion 150 MG 24 hr tablet Commonly known as:  WELLBUTRIN XL  Take 150 mg by mouth daily.   folic acid 1 MG tablet Commonly known as:  FOLVITE Take 1 mg by mouth daily after supper.   glucose blood test strip USE AS INSTRUCTED TO TEST BLOOD SUGARS 3 TIMES DAILY. DX CODE-E11.65   insulin aspart 100 UNIT/ML injection Commonly known as:  novoLOG INJECT 85 UNITS SUBCUTANEOUSLY ONCE DAILY WITH  INSULIN  PUMP Dx Code E11.65   metoprolol tartrate 25 MG tablet Commonly known as:  LOPRESSOR Take 1 tablet (25 mg total) by mouth 2 (two) times daily.   OMNIPOD 5 PACK Misc Place 1 each onto the skin as directed. Place 1 onto skin as directed every 48 hours   OSTEO BI-FLEX REGULAR STRENGTH PO Take 1 tablet by mouth daily after supper.   oxyCODONE 5 MG immediate release tablet Commonly known as:  Oxy IR/ROXICODONE Take 1 tablet (5 mg total) by mouth every 4  (four) hours as needed for severe pain.   VICTOZA 18 MG/3ML Sopn Generic drug:  liraglutide INJECT 1.8 MG SUBCUTANEOUSLY ONCE DAILY AT THE SAME TIME EVERYDAY What changed:  See the new instructions.   Vitamin D 2000 units tablet Take 2,000 Units by mouth daily after supper.      Follow-up Information    Mayra Neer, MD. Call in 1 day(s).   Specialty:  Family Medicine Contact information: [address] [place] [address] [phone]        Minus Breeding, MD .   Specialty:  Cardiology Contact information: [address] [place] [address] [phone]        Erlene Quan, PA-C Follow up.   Specialties:  Cardiology, Radiology Why:  Kerin Ransom, PA-C 11/7 @9am  ([facility])  Contact information: [place] [address] [phone]        Grace Isaac, MD Follow up.   Specialty:  Cardiothoracic Surgery Why:  Your routine follow-up appointment is on 08/24/2018 at 1:30 PM.  Please arrive at 1 PM for a chest x-ray located at [facility] which is on the first floor of our building. Contact information: [place] [address] [phone]          The patient has been discharged on:   1.Beta Blocker:  Yes [ yes  ]                              No   [   ]                              If No, reason:  2.Ace Inhibitor/ARB: Yes [   ]                                     No  [ no   ]                                     If No, reason:  3.Statin:   Yes [ yes  ]                  No  [   ]  If No, reason:  4.Ecasa:  Yes  [ yes  ]                  No   [   ]                  If No, reason:   Signed: Elgie Collard 07/25/2018, 8:07 AM

## 2018-07-23 NOTE — Discharge Instructions (Signed)

## 2018-07-23 NOTE — Progress Notes (Signed)
   Notes reviewed, seems to be progressing well.  Creatinine back at baseline.  Continuing with diuresis.  Candee Furbish, MD

## 2018-07-24 ENCOUNTER — Telehealth: Payer: Self-pay | Admitting: Endocrinology

## 2018-07-24 LAB — GLUCOSE, CAPILLARY
Glucose-Capillary: 136 mg/dL — ABNORMAL HIGH (ref 70–99)
Glucose-Capillary: 223 mg/dL — ABNORMAL HIGH (ref 70–99)
Glucose-Capillary: 260 mg/dL — ABNORMAL HIGH (ref 70–99)
Glucose-Capillary: 122 mg/dL — ABNORMAL HIGH (ref 70–99)

## 2018-07-24 MED ORDER — INSULIN DETEMIR 100 UNIT/ML ~~LOC~~ SOLN
38.0000 [IU] | Freq: Every day | SUBCUTANEOUS | Status: DC
  Filled 2018-07-24: qty 0.38

## 2018-07-24 NOTE — Telephone Encounter (Signed)
I would suggest to just reattach his pump with the previous settings for now.  He may need assistance from the diabetes educator from the hospital to attach it.   We will need to see how he does on the same settings, but tell him to watch his sugars closely as they may improve after his heart surgery.

## 2018-07-24 NOTE — Progress Notes (Addendum)
      [address]       [place] [address]             [phone]      4 Days Post-Op Procedure(s) (LRB): CORONARY ARTERY BYPASS GRAFTING (CABG) times three using left internal mammary artery to the LAD, and using endoscopically harvested right saphenous vein to PDA and intermedius. (N/A) TRANSESOPHAGEAL ECHOCARDIOGRAM (TEE) (N/A) Subjective: No issues overnight. Rhythm has been stable.   Objective: Vital signs in last 24 hours: Temp:  [97.7 F (36.5 C)-98.7 F (37.1 C)] 97.7 F (36.5 C) (10/25 0605) Pulse Rate:  [75-88] 88 (10/25 0605) Cardiac Rhythm: Normal sinus rhythm (10/25 0845) Resp:  [16-22] 16 (10/25 0605) BP: (126-134)/(69-73) 126/72 (10/25 0605) SpO2:  [96 %-97 %] 97 % (10/25 0605) Weight:  [88.8 kg] 88.8 kg (10/25 4580)     Intake/Output from previous day: 10/24 0701 - 10/25 0700 In: 440 [P.O.:440] Out: -  Intake/Output this shift: No intake/output data recorded.  General appearance: alert, cooperative and no distress Heart: regular rate and rhythm, S1, S2 normal, no murmur, click, rub or gallop Lungs: clear to auscultation bilaterally Abdomen: soft, non-tender; bowel sounds normal; no masses,  no organomegaly Extremities: extremities normal, atraumatic, no cyanosis or edema Wound: clean and dry  Lab Results: Recent Labs    07/22/18 0456 07/23/18 0232  WBC 7.3 6.6  HGB 9.9* 10.3*  HCT 29.3* 30.2*  PLT 121* 125*   BMET:  Recent Labs    07/22/18 0456 07/23/18 0232  NA 137 136  K 4.0 4.1  CL 108 104  CO2 24 25  GLUCOSE 161* 165*  BUN 23 22  CREATININE 1.52* 1.62*  CALCIUM 8.4* 8.8*    PT/INR: No results for input(s): LABPROT, INR in the last 72 hours. ABG    Component Value Date/Time   PHART 7.355 07/20/2018 1835   HCO3 21.6 07/20/2018 1835   TCO2 23 07/21/2018 1612   ACIDBASEDEF 4.0 (H) 07/20/2018 1835   O2SAT 95.0 07/20/2018 1835   CBG (last 3)  Recent Labs    07/23/18 2140 07/24/18 0602 07/24/18 1056    GLUCAP 153* 136* 223*    Assessment/Plan: S/P Procedure(s) (LRB): CORONARY ARTERY BYPASS GRAFTING (CABG) times three using left internal mammary artery to the LAD, and using endoscopically harvested right saphenous vein to PDA and intermedius. (N/A) TRANSESOPHAGEAL ECHOCARDIOGRAM (TEE) (N/A)  1. CV-NSR in the 70s, BP well controlled. Continue ASA, lipitor, and lopressor.  2. Pulm-Tolerating room air with good oxygen saturation. No CXR today. 3. Renal-creatinine is 1.62, electrolytes are okay. Continue diuretics and potassium supplementation.  4. H and H 10.3/30.2 expected acute blood loss anemia-stable 5. Endo-blood glucose with moderate control. Increase Levemir-it appears that is has an insulin pump at home.  6. Continue lovenox for DVT prophylaxis   Plan: Discontinue EPW-rhythm has been stable. Consult to endo to titrate the patient's insulin pump based on what SSI and Levemir he has been receiving. I also consulted the diabetes coordinator for assistance. Discussed holding his methotrexate after discharge. Continue ambulating TID. Encouraged incentive spirometer. Likely home in the am.     LOS: 4 days    Elgie Collard 07/24/2018   Chart reviewed, patient examined, agree with above. He looks good overall. Transitioning to insulin pump.  Weight is about at baseline with stable creat. Plan home tomorrow if no changes.

## 2018-07-24 NOTE — Plan of Care (Signed)
  Problem: Health Behavior/Discharge Planning: Goal: Ability to manage health-related needs will improve Outcome: Progressing   Problem: Clinical Measurements: Goal: Will remain free from infection Outcome: Progressing Goal: Respiratory complications will improve Outcome: Progressing   Problem: Elimination: Goal: Will not experience complications related to bowel motility Outcome: Progressing Goal: Will not experience complications related to urinary retention Outcome: Progressing   Problem: Safety: Goal: Ability to remain free from injury will improve Outcome: Progressing

## 2018-07-24 NOTE — Progress Notes (Addendum)
Inpatient Diabetes Program Recommendations  AACE/ADA: New Consensus Statement on Inpatient Glycemic Control (2015)  Target Ranges:  Prepandial:   less than 140 mg/dL      Peak postprandial:   less than 180 mg/dL (1-2 hours)      Critically ill patients:  140 - 180 mg/dL   Lab Results  Component Value Date   GLUCAP 223 (H) 07/24/2018   HGBA1C 7.0 (H) 07/20/2018    Review of Glycemic ControlResults for Alec [name], Alec [name] (MRN [id number]) as of 07/24/2018 14:14  Ref. Range 07/23/2018 15:58 07/23/2018 18:37 07/23/2018 21:40 07/24/2018 06:02 07/24/2018 10:56  Glucose-Capillary Latest Ref Range: 70 - 99 mg/dL 161 (H) 202 (H) 153 (H) 136 (H) 223 (H)   Diabetes history: Type 2 DM  Outpatient Diabetes medications:  Omnipod insulin pump:  Basal rate 2.2 units/hr 1 units for ever 10 grams of CHO/1 unit drops blood sugar approximately 50 mg/dL-Goal 120 mg/dL Current orders for Inpatient glycemic control:  TCTS tid with meals and HS, Levemir 38 units daily  Inpatient Diabetes Program Recommendations:    Referral received for insulin pump.  Note that per Dr. Cruzita Lederer patient should restart insulin pump at previous settings.  However patient has received Levemir 35 units already today.  Therefore will need to delay restart of insulin pump til 0800 in the morning.  Please dc Levemir and restart insulin pump at 0800 in the morning.  Once insulin pump is resumed, may consider d/c of Novolog as well.  Patient will need to f/u with Dr. Cruzita Lederer.    Thanks,  Adah Perl, RN, BC-ADM Inpatient Diabetes Coordinator Pager [phone] (8a-5p)  Addendum:  Discussed with patient and wife plan for resuming insulin pump in the AM.  They verbalized understanding. Explained that they needed to wait til tomorrow since patient received Levemir today.   He plans to put [facility] on at D/C to assist with DM management/ monitoring as well. Discussed with RN also.  Please page for questions this weekend 8a-5p.

## 2018-07-24 NOTE — Progress Notes (Signed)
Pt walked 2 laps this AM, rolling walker, room air. Tolerated well. Pt assisted back to chair.

## 2018-07-24 NOTE — Care Management Important Message (Signed)
Important Message  Patient Details  Name: Alec [name] MRN: [id number] Date of Birth: [birth year]   Medicare Important Message Given:  Yes    Kaj Vasil 07/24/2018, 11:41 AM

## 2018-07-24 NOTE — Progress Notes (Signed)
EPWs pulled per order.  Pt tolerated very well, all tips intact, sites unremarkable.  VSS, see flow sheet.  Pt and wife understand bedrest for one hr.  CCMD notified, will monitor closely.

## 2018-07-24 NOTE — Progress Notes (Addendum)
Progress Note  Patient Name: Alec [name] Date of Encounter: 07/24/2018  Primary Cardiologist: Minus Breeding, MD   Subjective   Doing well, ambulating well, no chest pain shortness of breath.  Weight down to 88 kg from 93.  Inpatient Medications    Scheduled Meds: . allopurinol  200 mg Oral QPC supper  . aspirin EC  325 mg Oral Daily  . atorvastatin  40 mg Oral q1800  . buPROPion  150 mg Oral Daily  . Chlorhexidine Gluconate Cloth  6 each Topical Daily  . docusate sodium  200 mg Oral Daily  . enoxaparin (LOVENOX) injection  40 mg Subcutaneous QHS  . folic acid  1 mg Oral QPC supper  . furosemide  40 mg Oral Daily  . insulin aspart  0-24 Units Subcutaneous TID AC & HS  . insulin detemir  35 Units Subcutaneous Daily  . mouth rinse  15 mL Mouth Rinse BID  . metoprolol tartrate  25 mg Oral BID  . mupirocin ointment  1 application Nasal BID  . pantoprazole  40 mg Oral QAC breakfast  . potassium chloride  20 mEq Oral Daily  . sodium chloride flush  3 mL Intravenous Q12H   Continuous Infusions: . sodium chloride     PRN Meds: sodium chloride, acetaminophen, bisacodyl **OR** bisacodyl, ondansetron **OR** ondansetron (ZOFRAN) IV, oxyCODONE, sodium chloride flush, traMADol   Vital Signs    Vitals:   07/23/18 1300 07/23/18 2004 07/24/18 0605 07/24/18 0632  BP: 132/69 134/73 126/72   Pulse:   88   Resp: 18 20 16    Temp:  98.7 F (37.1 C) 97.7 F (36.5 C)   TempSrc:  Oral Oral   SpO2: 97% 96% 97%   Weight:    88.8 kg  Height:        Intake/Output Summary (Last 24 hours) at 07/24/2018 0855 Last data filed at 07/23/2018 1303 Gross per 24 hour  Intake 440 ml  Output -  Net 440 ml   Filed Weights   07/22/18 0500 07/23/18 0400 07/24/18 0632  Weight: 92.5 kg 91 kg 88.8 kg    Telemetry    No adverse arrhythmias noted.- Personally Reviewed  ECG    No new- Personally Reviewed  Physical Exam   GEN: No acute distress.   Neck: No JVD Cardiac: RRR, no  murmurs, rubs, or gallops.  CABG scar Respiratory: Clear to auscultation bilaterally. GI: Soft, nontender, non-distended  MS: No edema; No deformity. Neuro:  Nonfocal  Psych: Normal affect   Labs    Chemistry Recent Labs  Lab 07/20/18 0630  07/21/18 0357 07/21/18 1602 07/21/18 1612 07/22/18 0456 07/23/18 0232  NA 139   < > 138  --  139 137 136  K 4.0   < > 4.4  --  4.1 4.0 4.1  CL 110   < > 111  --  105 108 104  CO2 24  --  23  --   --  24 25  GLUCOSE 109*   < > 108*  --  158* 161* 165*  BUN 20   < > 22  --  24* 23 22  CREATININE 1.78*   < > 1.52* 1.59* 1.60* 1.52* 1.62*  CALCIUM 9.1  --  8.3*  --   --  8.4* 8.8*  PROT 6.5  --   --   --   --   --  5.9*  ALBUMIN 3.6  --   --   --   --   --  3.2*  AST 55*  --   --   --   --   --  26  ALT 110*  --   --   --   --   --  49*  ALKPHOS 59  --   --   --   --   --  53  BILITOT 1.0  --   --   --   --   --  1.0  GFRNONAA 37*   < > 45* 42*  --  45* 41*  GFRAA 43*   < > 52* 49*  --  52* 48*  ANIONGAP 5  --  4*  --   --  5 7   < > = values in this interval not displayed.     Hematology Recent Labs  Lab 07/21/18 1602 07/21/18 1612 07/22/18 0456 07/23/18 0232  WBC 9.7  --  7.3 6.6  RBC 3.24*  --  3.02* 3.16*  HGB 10.3* 9.9* 9.9* 10.3*  HCT 31.4* 29.0* 29.3* 30.2*  MCV 96.9  --  97.0 95.6  MCH 31.8  --  32.8 32.6  MCHC 32.8  --  33.8 34.1  RDW 13.1  --  13.0 13.4  PLT 134*  --  121* 125*    Cardiac EnzymesNo results for input(s): TROPONINI in the last 168 hours. No results for input(s): TROPIPOC in the last 168 hours.   BNPNo results for input(s): BNP, PROBNP in the last 168 hours.   DDimer No results for input(s): DDIMER in the last 168 hours.   Radiology    Dg Chest Port 1 View  Result Date: 07/23/2018 CLINICAL DATA:  Status post coronary artery bypass graft. EXAM: PORTABLE CHEST 1 VIEW COMPARISON:  Radiograph of July 22, 2018. FINDINGS: Stable cardiomediastinal silhouette. Status post coronary artery bypass  graft. No pneumothorax is noted. No significant pleural effusion is noted. Minimal subsegmental atelectasis is noted in the left upper lobe in right midlung. Bony thorax is unremarkable. IMPRESSION: Minimal bilateral subsegmental atelectasis. Electronically Signed   By: Marijo Conception, M.D.   On: 07/23/2018 10:36    Cardiac Studies   Echo EF 55-60  Patient Profile     72 y.o. male with severe CAD status post CABG with chronic kidney disease stage III  Assessment & Plan    Diabetes with CKD stage III -Creatinine hovering around baseline, 1.6.  Avoid nephrotoxic agents such as NSAIDs.  Hyperlipidemia - On atorvastatin 40 mg, high intensity statin dose.  Check lipid profile in 2 months.  ALT in 2 months.  CAD post CABG -Progressing very well.  Ambulating well.  Cardiac rehab.  Looks close to discharge.  CARDIOLOGY RECOMMENDATIONS:  Discharge is anticipated in the next 48 hours. Recommendations for medications and follow up:  Discharge Medications: Continue medications as they are currently listed in the [facility].   Follow Up: The patient's Primary Cardiologist is Minus Breeding, MD Our office has been notified to schedule follow-up in 2 weeks.   Signed,  Candee Furbish, MD  8:57 AM 07/24/2018  [facility]     For questions or updates, please contact [facility] Please consult [email or web address] for contact info under        Signed, Candee Furbish, MD  07/24/2018, 8:55 AM

## 2018-07-24 NOTE — Progress Notes (Signed)
CARDIAC REHAB PHASE I   PRE:  Rate/Rhythm: 88 SR  BP:  Supine:   Sitting: 135/72  Standing:    SaO2: 96%RA  MODE:  Ambulation: 670 ft   POST:  Rate/Rhythm: 100 SR  BP:  Supine:   Sitting: 147/72  Standing:    SaO2: 98%RA 2992-4268 Pt walked 670 ft on RA with hand held asst. Gait steady. Second walk today. To recliner after walk. Education completed with pt and wife who voiced understanding. Reviewed staying in the tube, sternal precautions, IS, ex ed and carb counting and heart healthy food choices. Discussed CRP 2 and referred to [place]. Pt and wife instructed and were viewing discharge video when I left.   Graylon Good, RN BSN  07/24/2018 9:18 AM

## 2018-07-24 NOTE — Telephone Encounter (Signed)
[name] has called stating this patient had bypass surgery and has been off his pump since and they are wanting to work him back on to it for discharge tomorrow. Unfournately, no one in there department knows how and needs assistance. Please Advise. Office Ph # [phone] Nurses pager Ph # [phone]   Thanks!

## 2018-07-25 MED ORDER — ATORVASTATIN CALCIUM 40 MG PO TABS: 40.0000 mg | ORAL_TABLET | Freq: Every day | ORAL | 1 refills | Status: DC

## 2018-07-25 MED ORDER — METOPROLOL TARTRATE 25 MG PO TABS: 25.0000 mg | ORAL_TABLET | Freq: Two times a day (BID) | ORAL | 1 refills | Status: DC

## 2018-07-25 MED ORDER — ACETAMINOPHEN 325 MG PO TABS: 650.0000 mg | ORAL_TABLET | Freq: Four times a day (QID) | ORAL | Status: DC | PRN

## 2018-07-25 MED ORDER — OXYCODONE HCL 5 MG PO TABS: 5.0000 mg | ORAL_TABLET | ORAL | 0 refills | Status: DC | PRN

## 2018-07-25 MED ORDER — ASPIRIN 325 MG PO TBEC: 325.0000 mg | DELAYED_RELEASE_TABLET | Freq: Every day | ORAL | 0 refills | Status: DC

## 2018-07-25 NOTE — Progress Notes (Signed)
      [address]       [place] [address]             [phone]      5 Days Post-Op Procedure(s) (LRB): CORONARY ARTERY BYPASS GRAFTING (CABG) times three using left internal mammary artery to the LAD, and using endoscopically harvested right saphenous vein to PDA and intermedius. (N/A) TRANSESOPHAGEAL ECHOCARDIOGRAM (TEE) (N/A) Subjective No issues overnight. He is ready for home.  Objective: Vital signs in last 24 hours: Temp:  [98.8 F (37.1 C)-99.1 F (37.3 C)] 98.8 F (37.1 C) (10/26 0523) Pulse Rate:  [76] 76 (10/25 1343) Cardiac Rhythm: Normal sinus rhythm (10/26 0500) Resp:  [16-23] 23 (10/26 0523) BP: (113-143)/(65-88) 138/76 (10/26 0523) SpO2:  [94 %-98 %] 97 % (10/26 0523) Weight:  [87.4 kg] 87.4 kg (10/26 0523)     Intake/Output from previous day: 10/25 0701 - 10/26 0700 In: 240 [P.O.:240] Out: -  Intake/Output this shift: No intake/output data recorded.  General appearance: alert, cooperative and no distress Heart: regular rate and rhythm, S1, S2 normal, no murmur, click, rub or gallop Lungs: clear to auscultation bilaterally Abdomen: soft, non-tender; bowel sounds normal; no masses,  no organomegaly Extremities: extremities normal, atraumatic, no cyanosis or edema Wound: clean and dry  Lab Results: Recent Labs    07/23/18 0232  WBC 6.6  HGB 10.3*  HCT 30.2*  PLT 125*   BMET:  Recent Labs    07/23/18 0232  NA 136  K 4.1  CL 104  CO2 25  GLUCOSE 165*  BUN 22  CREATININE 1.62*  CALCIUM 8.8*    PT/INR: No results for input(s): LABPROT, INR in the last 72 hours. ABG    Component Value Date/Time   PHART 7.355 07/20/2018 1835   HCO3 21.6 07/20/2018 1835   TCO2 23 07/21/2018 1612   ACIDBASEDEF 4.0 (H) 07/20/2018 1835   O2SAT 95.0 07/20/2018 1835   CBG (last 3)  Recent Labs    07/24/18 1056 07/24/18 1649 07/24/18 2151  GLUCAP 223* 260* 122*    Assessment/Plan: S/P Procedure(s) (LRB): CORONARY ARTERY BYPASS  GRAFTING (CABG) times three using left internal mammary artery to the LAD, and using endoscopically harvested right saphenous vein to PDA and intermedius. (N/A) TRANSESOPHAGEAL ECHOCARDIOGRAM (TEE) (N/A)   1. CV-NSR in the 80s, BP well controlled. Continue ASA, lipitor, and lopressor.  2. Pulm-Tolerating room air with good oxygen saturation. No CXR today. 3. Renal-creatinine is 1.62, electrolytes are okay. Continue diuretics and potassium supplementation.  4. H and H 10.3/30.2 expected acute blood loss anemia-stable 5. Endo-blood glucose with moderate control. Resume insulin pump today on the same settings as preop. Endocrine aware. Please keep track of sugars closely.  6. Continue lovenox for DVT prophylaxis   Plan: Discharge today. Hook up insulin pump this morning   LOS: 5 days    Alec [name] 07/25/2018

## 2018-07-25 NOTE — Progress Notes (Signed)
CT sutures removed per order Nicholes Rough, [place]).  Pt tolerated procedure well.  PIV removed for pt discharge.

## 2018-07-25 NOTE — Progress Notes (Signed)
Pt AVS reviewed with pt medications and appointments reviewed.  All questions addressed.  Pt acknowledged understanding.

## 2018-07-27 ENCOUNTER — Telehealth: Payer: Self-pay | Admitting: Cardiology

## 2018-07-27 LAB — GLUCOSE, CAPILLARY: Glucose-Capillary: 123 mg/dL — ABNORMAL HIGH (ref 70–99)

## 2018-07-27 NOTE — Telephone Encounter (Signed)
Returned call to patient's wife.She stated someone already answered her question about aspirin in my chart.Advised to continue aspirin 325 mg daily.Keep post hospital appointment with Kerin Ransom PA 08/06/18 at 9:00 am.

## 2018-07-27 NOTE — Telephone Encounter (Signed)
New Message:    Please call, pt's Aspirin dose was increased to 325 mg while he was in the hospital. Her question is, will pt stay on this dose?

## 2018-07-28 ENCOUNTER — Telehealth (HOSPITAL_COMMUNITY): Payer: Self-pay

## 2018-07-28 NOTE — Telephone Encounter (Signed)
Pt insurance is active and benefits verified through Medicare A/B. Co-pay $0.00, DED $185.00/$185.00 met, out of pocket $0.00/$0.00 met, co-insurance 20%. No pre-authorization. Passport, 07/27/18 @ 10:04AM, REF# [id number]  2ndary insurance is active and benefits verified through [place]. Co-pay $0.00, DED $0.00/$0.00 met, out of pocket $0.00/$0.00 met, co-insurance 0%. No pre-authorization. Passport, 07/27/18 @ 10:05AM, REF# [id number]   Will contact patient to see if he is interested in the Cardiac Rehab Program. If interested, patient will need to complete follow up appt. Once completed, patient will be contacted for scheduling upon review by the RN Navigator.

## 2018-07-28 NOTE — Telephone Encounter (Signed)
Called patient to see if he is interested in the Cardiac Rehab Program. Patient expressed interest. Explained scheduling process and went over insurance, patient verbalized understanding. Will contact patient for scheduling once f/u has been completed.  °

## 2018-07-28 NOTE — Telephone Encounter (Signed)
Called pt stated--did reattach his pump with assistance diabetes from the hospital. Pt stated have no concern today and if needed additional question will call the office back.

## 2018-08-04 ENCOUNTER — Telehealth: Payer: Self-pay | Admitting: Endocrinology

## 2018-08-04 NOTE — Telephone Encounter (Signed)
Patient states they would like a call back from [facility]. They are having problems with there meter. Patient states the scanning and pricking there finger is a 7 point difference. Please Advise. Ph # [phone]

## 2018-08-05 NOTE — Telephone Encounter (Signed)
Message left on machine that the sensor is not measuring blood sugar.  It is measuring the sugar level of the tissue surrounding the blood stream, and that it will never be identical.  He was given my number to call back if he has more questions.

## 2018-08-06 ENCOUNTER — Ambulatory Visit (INDEPENDENT_AMBULATORY_CARE_PROVIDER_SITE_OTHER): Payer: Medicare Other | Admitting: Cardiology

## 2018-08-06 ENCOUNTER — Encounter: Payer: Self-pay | Admitting: Cardiology

## 2018-08-06 VITALS — BP 155/87 | HR 65 | Resp 16 | Ht 66.0 in | Wt 188.2 lb

## 2018-08-06 DIAGNOSIS — E119 Type 2 diabetes mellitus without complications: Secondary | ICD-10-CM

## 2018-08-06 DIAGNOSIS — I10 Essential (primary) hypertension: Secondary | ICD-10-CM

## 2018-08-06 DIAGNOSIS — I2510 Atherosclerotic heart disease of native coronary artery without angina pectoris: Secondary | ICD-10-CM

## 2018-08-06 DIAGNOSIS — N183 Chronic kidney disease, stage 3 unspecified: Secondary | ICD-10-CM | POA: Insufficient documentation

## 2018-08-06 DIAGNOSIS — IMO0001 Reserved for inherently not codable concepts without codable children: Secondary | ICD-10-CM

## 2018-08-06 DIAGNOSIS — Z951 Presence of aortocoronary bypass graft: Secondary | ICD-10-CM | POA: Diagnosis not present

## 2018-08-06 DIAGNOSIS — Z794 Long term (current) use of insulin: Secondary | ICD-10-CM | POA: Diagnosis not present

## 2018-08-06 DIAGNOSIS — E785 Hyperlipidemia, unspecified: Secondary | ICD-10-CM | POA: Diagnosis not present

## 2018-08-06 MED ORDER — AMLODIPINE BESYLATE 5 MG PO TABS: 5.0000 mg | ORAL_TABLET | Freq: Every day | ORAL | 2 refills | Status: DC

## 2018-08-06 MED ORDER — NITROGLYCERIN 0.4 MG SL SUBL: 0.4000 mg | SUBLINGUAL_TABLET | SUBLINGUAL | 1 refills | Status: DC | PRN

## 2018-08-06 NOTE — Assessment & Plan Note (Signed)
CABG x 3 with LIMA-LAD, SVG-RI, SVG-PDA 07/20/18

## 2018-08-06 NOTE — Assessment & Plan Note (Signed)
Followed by Dr. Kumar.  ?

## 2018-08-06 NOTE — Patient Instructions (Signed)
Medication Instructions:  Sent Nitroglycerin RX as needed. Amlodipine 5 mg daily If you need a refill on your cardiac medications before your next appointment, please call your pharmacy.   Lab work: None ordered. If you have labs (blood work) drawn today and your tests are completely normal, you will receive your results only by: Marland Kitchen MyChart Message (if you have MyChart) OR . A paper copy in the mail If you have any lab test that is abnormal or we need to change your treatment, we will call you to review the results.  Testing/Procedures: None Ordered.  Follow-Up: At [facility], you and your health needs are our priority.  As part of our continuing mission to provide you with exceptional heart care, we have created designated Provider Care Teams.  These Care Teams include your primary Cardiologist (physician) and Advanced Practice Providers (APPs -  Physician Assistants and Nurse Practitioners) who all work together to provide you with the care you need, when you need it. You will need a follow up appointment in 2-3 months. You may see Alec Breeding, MD or one of the following Advanced Practice Providers on your designated Care Team:   Rosaria Ferries, PA-C . Jory Sims, DNP, ANP  Any Other Special Instructions Will Be Listed Below (If Applicable). None

## 2018-08-06 NOTE — Progress Notes (Signed)
08/06/2018 Alec [name]   06-Aug-1946  [id number]  Primary Physician Mayra Neer, MD Primary Cardiologist: Dr Percival Spanish  HPI:  Pleasant 72 y/o male followed by Dr Percival Spanish. He has a history of IDDM, CRI-3, HTN, and HLD.  In Oct this year he complained of chest pain concerning for angina.  He had  A POET which was abnormal.  Cath done 07/14/18 showed multi vessel CAD.the patient underwent CABG x 3 on 07/20/18.  He tolerated this well.  He is in the office today for follow up.  Since discharge he has done well.  He admits to some anorexia and he says he sleeps more comfortably in a recliner.  We is walking daily, anxious to "get back to work".     Current Outpatient Medications  Medication Sig Dispense Refill  . allopurinol (ZYLOPRIM) 100 MG tablet Take 200 mg by mouth daily after supper.    Marland Kitchen aspirin 325 MG EC tablet Take 1 tablet (325 mg total) by mouth daily. 30 tablet 0  . atorvastatin (LIPITOR) 40 MG tablet Take 1 tablet (40 mg total) by mouth daily at 6 PM. 30 tablet 1  . buPROPion (WELLBUTRIN XL) 150 MG 24 hr tablet Take 150 mg by mouth daily.    . Cholecalciferol (VITAMIN D) 2000 units tablet Take 2,000 Units by mouth daily after supper.    . Glucosamine-Chondroitin (OSTEO BI-FLEX REGULAR STRENGTH PO) Take 1 tablet by mouth daily after supper.    Marland Kitchen glucose blood (FREESTYLE LITE) test strip USE AS INSTRUCTED TO TEST BLOOD SUGARS 3 TIMES DAILY. DX CODE-E11.65 100 each 3  . insulin aspart (NOVOLOG) 100 UNIT/ML injection INJECT 85 UNITS SUBCUTANEOUSLY ONCE DAILY WITH  INSULIN  PUMP Dx Code E11.65 70 mL 3  . Insulin Disposable Pump (OMNIPOD 5 PACK) MISC Place 1 each onto the skin as directed. Place 1 onto skin as directed every 48 hours 15 each 3  . metoprolol tartrate (LOPRESSOR) 25 MG tablet Take 1 tablet (25 mg total) by mouth 2 (two) times daily. 60 tablet 1  . Multiple Vitamins-Minerals (ADVANCED DIABETIC MULTIVITAMIN) TABS Take 2 tablets by mouth daily after supper.    Marland Kitchen  VICTOZA 18 MG/3ML SOPN INJECT 1.8 MG SUBCUTANEOUSLY ONCE DAILY AT THE SAME TIME EVERYDAY (Patient taking differently: Inject 1.2 mg into the skin daily. ) 27 mL 1  . amLODipine (NORVASC) 5 MG tablet Take 1 tablet (5 mg total) by mouth daily. 90 tablet 2  . nitroGLYCERIN (NITROSTAT) 0.4 MG SL tablet Place 1 tablet (0.4 mg total) under the tongue every 5 (five) minutes as needed for chest pain. 30 tablet 1   No current facility-administered medications for this visit.     No Known Allergies  Past Medical History:  Diagnosis Date  . Anxiety   . Arthritis    "maybe in my hands" (07/15/2018)  . Bladder cancer ([place]) 2009  . CKD (chronic kidney disease), stage III ([place])   . Coronary artery disease   . Degenerative arthritis of shoulder region 08/2013   left  . Disorder of bursae and tendons in left shoulder region 08/2013  . Gout    "on daily RX" (07/15/2018)  . History of kidney stones   . Hyperlipidemia   . Hypertension    under control with meds., has been on med. > 10 yr.  . IDDM (insulin dependent diabetes mellitus) ([place])     Social History   Socioeconomic History  . Marital status: Married    Spouse name: Not  on file  . Number of children: Not on file  . Years of education: Not on file  . Highest education level: Not on file  Occupational History  . Not on file  Social Needs  . Financial resource strain: Not on file  . Food insecurity:    Worry: Not on file    Inability: Not on file  . Transportation needs:    Medical: Not on file    Non-medical: Not on file  Tobacco Use  . Smoking status: Former Smoker    Packs/day: 2.00    Years: 25.00    Pack years: 50.00    Types: Cigarettes    Last attempt to quit: 09/11/1997    Years since quitting: 20.9  . Smokeless tobacco: Never Used  Substance and Sexual Activity  . Alcohol use: Not Currently    Comment: 07/17/2018 "nothing since the mid 1990s"  . Drug use: Never  . Sexual activity: Yes  Lifestyle  . Physical  activity:    Days per week: Not on file    Minutes per session: Not on file  . Stress: Not on file  Relationships  . Social connections:    Talks on phone: Not on file    Gets together: Not on file    Attends religious service: Not on file    Active member of club or organization: Not on file    Attends meetings of clubs or organizations: Not on file    Relationship status: Not on file  . Intimate partner violence:    Fear of current or ex partner: Not on file    Emotionally abused: Not on file    Physically abused: Not on file    Forced sexual activity: Not on file  Other Topics Concern  . Not on file  Social History Narrative  . Not on file     Family History  Problem Relation Age of Onset  . Diabetes Mother   . Cancer Father   . Coronary artery disease Father        MI at age 73.   . Sudden death Son   . CAD Maternal Grandfather        MI in his 66's  . CAD Paternal Grandfather      Review of Systems: General: negative for chills, fever, night sweats or weight changes.  Cardiovascular: negative for chest pain, dyspnea on exertion, edema, orthopnea, palpitations, paroxysmal nocturnal dyspnea or shortness of breath Dermatological: negative for rash Respiratory: negative for cough or wheezing Urologic: negative for hematuria Abdominal: negative for nausea, vomiting, diarrhea, bright red blood per rectum, melena, or hematemesis Neurologic: negative for visual changes, syncope, or dizziness All other systems reviewed and are otherwise negative except as noted above.    Blood pressure (!) 155/87, pulse 65, resp. rate 16, height 5\' 6"  (1.676 m), weight 188 lb 3.2 oz (85.4 kg).  General appearance: alert, cooperative and no distress Neck: no JVD Lungs: clear to auscultation bilaterally Heart: regular rate and rhythm Extremities: no edema Neurologic: Grossly normal  EKG NSR- HR 62, lateral TWI  ASSESSMENT AND PLAN:   S/P CABG x 3 CABG x 3 with LIMA-LAD, SVG-RI,  SVG-PDA 07/20/18  Essential hypertension B/P 142/80  IDDM (insulin dependent diabetes mellitus) ([place]) Followed by Dr Dwyane Dee  CRI (chronic renal insufficiency), stage 3 (moderate) ([facility]) GFR 41  Dyslipidemia, goal LDL below 70 On Victoza   PLAN  I suggested we add Norvasc 5 mg.  He brought in B/P readings from  home and his systolic B/P has been running in the 130-140 range.   F/U with Dr Percival Spanish in 2-3 months.   Kerin Ransom PA-C 08/06/2018 9:21 AM

## 2018-08-06 NOTE — Assessment & Plan Note (Signed)
B/P 142/80

## 2018-08-06 NOTE — Assessment & Plan Note (Signed)
GFR 41

## 2018-08-06 NOTE — Assessment & Plan Note (Signed)
On Victoza

## 2018-08-13 ENCOUNTER — Other Ambulatory Visit: Payer: Self-pay | Admitting: Endocrinology

## 2018-08-14 ENCOUNTER — Telehealth (HOSPITAL_COMMUNITY): Payer: Self-pay

## 2018-08-14 NOTE — Telephone Encounter (Signed)
Attempted to contact patient in regards to Cardiac Rehab - lm on vm

## 2018-08-21 ENCOUNTER — Other Ambulatory Visit: Payer: Self-pay | Admitting: Cardiothoracic Surgery

## 2018-08-21 ENCOUNTER — Telehealth (HOSPITAL_COMMUNITY): Payer: Self-pay

## 2018-08-21 DIAGNOSIS — Z951 Presence of aortocoronary bypass graft: Secondary | ICD-10-CM

## 2018-08-21 NOTE — Telephone Encounter (Signed)
2nd attempt to contact patient in regards to CR - could not leave vm. Sent letter.

## 2018-08-24 ENCOUNTER — Ambulatory Visit
Admission: RE | Admit: 2018-08-24 | Discharge: 2018-08-24 | Disposition: A | Discharge status: Home / Self Care | Payer: Medicare Other | Source: Ambulatory Visit | Attending: Cardiothoracic Surgery | Admitting: Cardiothoracic Surgery

## 2018-08-24 ENCOUNTER — Ambulatory Visit (INDEPENDENT_AMBULATORY_CARE_PROVIDER_SITE_OTHER): Payer: Self-pay | Admitting: Physician Assistant

## 2018-08-24 VITALS — BP 131/68 | HR 72 | Resp 20 | Ht 66.0 in | Wt 189.0 lb

## 2018-08-24 DIAGNOSIS — Z951 Presence of aortocoronary bypass graft: Secondary | ICD-10-CM

## 2018-08-24 DIAGNOSIS — I2510 Atherosclerotic heart disease of native coronary artery without angina pectoris: Secondary | ICD-10-CM

## 2018-08-24 NOTE — Progress Notes (Signed)
Alec [name] is a 72 y.o. male patient who is s/p CABG who presents today for a routine follow-up appointment.   1. S/P CABG x 3   2. Coronary artery disease involving native heart without angina pectoris, unspecified vessel or lesion type    Past Medical History:  Diagnosis Date  . Anxiety   . Arthritis    "maybe in my hands" (07/15/2018)  . Bladder cancer ([place]) 2009  . CKD (chronic kidney disease), stage III ([place])   . Coronary artery disease   . Degenerative arthritis of shoulder region 08/2013   left  . Disorder of bursae and tendons in left shoulder region 08/2013  . Gout    "on daily RX" (07/15/2018)  . History of kidney stones   . Hyperlipidemia   . Hypertension    under control with meds., has been on med. > 10 yr.  . IDDM (insulin dependent diabetes mellitus) ([place])    No past surgical history pertinent negatives on file. Scheduled Meds: Current Outpatient Medications on File Prior to Visit  Medication Sig Dispense Refill  . allopurinol (ZYLOPRIM) 100 MG tablet Take 200 mg by mouth daily after supper.    Marland Kitchen amLODipine (NORVASC) 5 MG tablet Take 1 tablet (5 mg total) by mouth daily. 90 tablet 2  . aspirin 325 MG EC tablet Take 1 tablet (325 mg total) by mouth daily. 30 tablet 0  . atorvastatin (LIPITOR) 40 MG tablet Take 1 tablet (40 mg total) by mouth daily at 6 PM. 30 tablet 1  . buPROPion (WELLBUTRIN XL) 150 MG 24 hr tablet Take 150 mg by mouth daily.    . Cholecalciferol (VITAMIN D) 2000 units tablet Take 2,000 Units by mouth daily after supper.    . Glucosamine-Chondroitin (OSTEO BI-FLEX REGULAR STRENGTH PO) Take 1 tablet by mouth daily after supper.    Marland Kitchen glucose blood (FREESTYLE LITE) test strip USE AS INSTRUCTED TO TEST BLOOD SUGARS 3 TIMES DAILY. DX CODE-E11.65 100 each 3  . insulin aspart (NOVOLOG) 100 UNIT/ML injection INJECT 85 UNITS SUBCUTANEOUSLY ONCE DAILY WITH  INSULIN  PUMP Dx Code E11.65 70 mL 3  . Insulin Disposable Pump (OMNIPOD 5 PACK) MISC Place 1 each  onto the skin as directed. Place 1 onto skin as directed every 48 hours 15 each 3  . Insulin Disposable Pump (OMNIPOD 5 PACK) MISC CHANGE PODS EVERY 48 HOURS AS DIRECTED 25 each 0  . metoprolol tartrate (LOPRESSOR) 25 MG tablet Take 1 tablet (25 mg total) by mouth 2 (two) times daily. 60 tablet 1  . Multiple Vitamins-Minerals (ADVANCED DIABETIC MULTIVITAMIN) TABS Take 2 tablets by mouth daily after supper.    . nitroGLYCERIN (NITROSTAT) 0.4 MG SL tablet Place 1 tablet (0.4 mg total) under the tongue every 5 (five) minutes as needed for chest pain. 30 tablet 1  . VICTOZA 18 MG/3ML SOPN INJECT 1.8 MG SUBCUTANEOUSLY ONCE DAILY AT THE SAME TIME EVERYDAY (Patient taking differently: Inject 1.2 mg into the skin daily. ) 27 mL 1   No current facility-administered medications on file prior to visit.      Blood pressure 131/68, pulse 72, resp. rate 20, height 5\' 6"  (1.676 m), weight 189 lb (85.7 kg), SpO2 97 %.  Subjective  Alec [name] presents today for his routine 4-week follow-up visit.  He is status post coronary bypass grafting x3.  Objective  Cor: Normal sinus rhythm, no murmur Pulm: Clear to auscultation bilaterally and in all fields Abdomen: No tenderness Wound: Sternal incision is clean,  dry, and intact.  No drainage.  EVH site is healing well. Extremities: No edema     CLINICAL DATA:  Patient status post CABG 07/20/2018.  EXAM: CHEST - 2 VIEW  COMPARISON:  Single-view of the chest 07/23/2018.  FINDINGS: Seven intact median sternotomy wires are unchanged. The lungs are clear. There is no pneumothorax or pleural effusion. Heart size is normal. No acute or focal bony abnormality.  IMPRESSION: No acute disease.   Electronically Signed   By: Inge Rise M.D.   On: 08/24/2018 12:58  Assessment & Plan   Alec [name] is a 72 year old male patient status post coronary bypass grafting x4 by Dr. Servando Snare.  He is doing very well today.  He is ambulating without shortness of  breath at home.  He is occasionally using his incentive spirometer.  He currently does not have any pain and is no longer taking narcotic pain medication therefore, I cleared him for driving.  I did educate him about a cardiac rehab program and he does need to think about this decision.  He will contact our office with a decision.  He would like to return to work as soon as possible.  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  The longest distance he has to travel is 2 hours.  He does not have to do any heavy lifting.  I wrote him a note today to clear him for driving without heavy lifting 7 weeks out from surgery.  I discussed this decision with Dr. Servando Snare.  He will need to check with his DOT license and provide any paperwork needed for clearance.  He has followed up with his cardiology PA and is scheduled to follow-up with the cardiology physician in the next few weeks.  He also needs to follow-up with his primary care provider.  He does not need to follow-up with our practice unless things change with his medical status.  His incisions are all healing well and his chest x-ray was reviewed with the patient.  I do not have any concerns.  He is welcome to call our office with questions or concerns if they arise in the future.  Alec [name] 08/24/2018

## 2018-08-24 NOTE — Patient Instructions (Signed)
You may return to driving an automobile as long as you are no longer requiring oral narcotic pain relievers during the daytime.  It would be wise to start driving only short distances during the daylight and gradually increase from there as you feel comfortable.  You are encouraged to enroll and participate in the outpatient cardiac rehab program beginning as soon as practical.  Make every effort to stay physically active, get some type of exercise on a regular basis, and stick to a "heart healthy diet".  The long term benefits for regular exercise and a healthy diet are critically important to your overall health and wellbeing.

## 2018-09-09 ENCOUNTER — Telehealth (HOSPITAL_COMMUNITY): Payer: Self-pay

## 2018-09-09 NOTE — Telephone Encounter (Signed)
3rd Attempted to call patient in regards to Cardiac Rehab - LM on VM

## 2018-09-11 ENCOUNTER — Other Ambulatory Visit: Payer: Self-pay | Admitting: Physician Assistant

## 2018-09-21 ENCOUNTER — Other Ambulatory Visit: Payer: Self-pay | Admitting: Family Medicine

## 2018-09-22 ENCOUNTER — Other Ambulatory Visit: Payer: Self-pay | Admitting: Physician Assistant

## 2018-09-25 ENCOUNTER — Telehealth: Payer: Self-pay | Admitting: Cardiology

## 2018-09-25 MED ORDER — METOPROLOL TARTRATE 25 MG PO TABS: 25.0000 mg | ORAL_TABLET | Freq: Two times a day (BID) | ORAL | 3 refills | Status: DC

## 2018-09-25 MED ORDER — ATORVASTATIN CALCIUM 40 MG PO TABS: 40.0000 mg | ORAL_TABLET | Freq: Every day | ORAL | 3 refills | Status: DC

## 2018-09-25 NOTE — Telephone Encounter (Signed)
Returned call to patient's wife.She stated husband needs refills for atorvastatin and metoprolol.90 day refills sent to pharmacy.

## 2018-09-25 NOTE — Telephone Encounter (Signed)
New message:   Patient wife calling concerning her husband refill. She states that it says they don't have anymore refills. Please patient wife.

## 2018-09-29 NOTE — Telephone Encounter (Signed)
No response from pt.  Closed referral  

## 2018-10-05 ENCOUNTER — Telehealth: Payer: Self-pay | Admitting: Endocrinology

## 2018-10-05 ENCOUNTER — Other Ambulatory Visit: Payer: Self-pay

## 2018-10-05 MED ORDER — OMNIPOD CLASSIC PODS (GEN 3) MISC: 1.0000 | 3 refills | Status: DC

## 2018-10-05 NOTE — Telephone Encounter (Signed)
Patient has called upset stating [facility] has faxed over twice paper work for sensors. Patient is unable to give the name of the sensors. Please call patient on updates with this medication. Ph # [phone] Patient is also stating OmniPod needs to be resent to [facility] in [place].

## 2018-10-05 NOTE — Telephone Encounter (Signed)
Rx sent to pharmacy in [place]. Paperwork has already been sent and receipt confirmation received for the fax.

## 2018-10-19 ENCOUNTER — Other Ambulatory Visit (INDEPENDENT_AMBULATORY_CARE_PROVIDER_SITE_OTHER): Payer: Medicare Other

## 2018-10-19 DIAGNOSIS — E1165 Type 2 diabetes mellitus with hyperglycemia: Secondary | ICD-10-CM

## 2018-10-19 DIAGNOSIS — Z794 Long term (current) use of insulin: Secondary | ICD-10-CM | POA: Diagnosis not present

## 2018-10-19 LAB — COMPREHENSIVE METABOLIC PANEL
ALT: 19 U/L (ref 0–53)
AST: 19 U/L (ref 0–37)
Albumin: 4 g/dL (ref 3.5–5.2)
Alkaline Phosphatase: 81 U/L (ref 39–117)
BUN: 13 mg/dL (ref 6–23)
CO2: 31 mEq/L (ref 19–32)
Calcium: 9.5 mg/dL (ref 8.4–10.5)
Chloride: 106 mEq/L (ref 96–112)
Creatinine, Ser: 1.51 mg/dL — ABNORMAL HIGH (ref 0.40–1.50)
GFR: 45.88 mL/min — ABNORMAL LOW (ref >60.00)
Glucose, Bld: 135 mg/dL — ABNORMAL HIGH (ref 70–99)
Potassium: 4.3 mEq/L (ref 3.5–5.1)
Sodium: 141 mEq/L (ref 135–145)
Total Bilirubin: 0.6 mg/dL (ref 0.2–1.2)
Total Protein: 7.1 g/dL (ref 6.0–8.3)

## 2018-10-19 LAB — HEMOGLOBIN A1C: Hgb A1c MFr Bld: 7.5 % — ABNORMAL HIGH (ref 4.6–6.5)

## 2018-10-20 ENCOUNTER — Telehealth: Payer: Self-pay | Admitting: Endocrinology

## 2018-10-20 ENCOUNTER — Other Ambulatory Visit: Payer: Self-pay | Admitting: Endocrinology

## 2018-10-20 MED ORDER — ROPINIROLE HCL 0.5 MG PO TABS: 0.5000 mg | ORAL_TABLET | Freq: Every day | ORAL | 0 refills | Status: DC

## 2018-10-20 NOTE — Telephone Encounter (Signed)
He needs to start getting this from his PCP

## 2018-10-20 NOTE — Telephone Encounter (Signed)
Since this is not a diabetes medication he needs to switch prescriptions to Dr. Brigitte Pulse.  15 tablets have been sent in the interim

## 2018-10-20 NOTE — Telephone Encounter (Signed)
Medication not on pt's med list.  Refill or deny?

## 2018-10-20 NOTE — Telephone Encounter (Signed)
MEDICATION: Ropinirole  PHARMACY:  [facility] on [address]  IS THIS A 90 DAY SUPPLY :  NO  IS PATIENT OUT OF MEDICATION: no  IF NOT; HOW MUCH IS LEFT: 1 left  LAST APPOINTMENT DATE: @1 /03/2019  NEXT APPOINTMENT DATE:@1 /23/2020  DO WE HAVE YOUR PERMISSION TO LEAVE A DETAILED MESSAGE:  OTHER COMMENTS:    **Let patient know to contact pharmacy at the end of the day to make sure medication is ready. **  ** Please notify patient to allow 48-72 hours to process**  **Encourage patient to contact the pharmacy for refills or they can request refills through [facility] [employer]**

## 2018-10-20 NOTE — Telephone Encounter (Signed)
Called pt and gave him MD message. Pt began hollering and screaming and cussing. Pt stated that he would be finding a new doctor and this was "bull". Pt stated that Dr. [name] Derby named was on the bottle he currently has. Pt was then told that Dr. Dwyane Dee did not deny the fact that he had filled this medication in the past, but from this point forward, he needs to be getting it from his PCP. Pt then began hollering again and stated that his appt is this Thursday and he would be here to "prove all y'all wrong".

## 2018-10-21 NOTE — Progress Notes (Signed)
Patient ID: Alec [name], male   DOB: 1946-07-30, 73 y.o.   MRN: [id number]             Reason for Appointment:  Follow-up for Type 2 Diabetes  History of Present Illness:          Diagnosis: Type 2 diabetes mellitus, date of diagnosis: 2001       Prior history: He was seen in consultation in 11/2014 when his A1c was 7.4 Since his blood sugars were averaging over 200 he was given Victoza in addition to his basal bolus insulin regimen in 6/16 He started on the V go pump in October 2016  Recent history:    INSULIN regimen is described as:  BASAL rate on Omnipod pump 2.2 over 24 hours Boluses 12 units lunch and dinner and 14.5 units before breakfast   Non-insulin hypoglycemic drugs the patient is taking are: Victoza 1.2 mg daily   His A1c is higher at 7.5 compared to 7 and 6.8  Current blood sugar patterns from analysis of his meter download, daily management and problems identified:  He has used his FreeStyle libre only in the last 3 to 4 days as he did not have supplies and previous record not available  Again because of variable activity level his blood sugars are inconsistent in the afternoons  He is usually having high readings on weekends when he is not as active with frequent readings over 200  Otherwise on some days including yesterday when he is more active in the afternoon he will start getting mild hypoglycemia  He has been told to adjust his boluses for larger meals which occasionally he does otherwise usually taking 12 units for breakfast and lunch  He thinks he is aware of the temporary basal feature but does not use it  Currently able to continue Victoza without financial issues  Overnight blood sugars on his freestyle libre recently show variable patterns with the high readings on 2 nights but near normal readings 3 nights ago  Side effects from medications have been:none  Exercise: No formal exercise  Glucose  monitoring:  Freestyle           PRE-MEAL Fasting Lunch Dinner Bedtime Overall  Glucose range:  80-196   54-289    Averages:  162  140  164   160   POST-MEAL PC Breakfast PC Lunch PC Dinner  Glucose range:     Averages:       Previous readings  PRE-MEAL Fasting Lunch Dinner Bedtime Overall  Glucose range:  87-240  107-276  77-241    Mean/median:  148  178  132  153   Self-care:  Meals: 3 meals per day. Lunch  1 pm Dinner 6 pm Breakfast is usually an egg sandwich but sometimes biscuits   Eating balanced meals in the evening.  He will have snacks with granola bars              Dietician visit, most recent: 6/15.               Weight history:  Wt Readings from Last 3 Encounters:  10/22/18 199 lb 9.6 oz (90.5 kg)  08/24/18 189 lb (85.7 kg)  08/06/18 188 lb 3.2 oz (85.4 kg)    Glycemic control:    Lab Results  Component Value Date   HGBA1C 7.5 (H) 10/19/2018   HGBA1C 7.0 (H) 07/20/2018   HGBA1C 6.8 (H) 06/16/2018   Lab Results  Component Value Date   MICROALBUR  5.9 (H) 06/16/2018   LDLCALC 45 07/16/2018   CREATININE 1.51 (H) 10/19/2018     Past history:  He was not symptomatic at time of diagnosis and was probably treated with Amaryl only.  No details are available of previous management However he thinks he was not given metformin at any time because of his "kidney problem" He had a A1c of over 9% in 2013 and may have been started on insulin at that time. Most likely has been taking Levemir and NovoLog since then A1c records show his results were over 8% in 2014 and in 4/15 but subsequently down to 7.1 in November 2015 Amaryl was stopped in 7/16  Other active problems are addressed in Review of systems   Allergies as of 10/22/2018   No Known Allergies     Medication List       Accurate as of October 22, 2018  8:00 AM. Always use your most recent med list.        ADVANCED DIABETIC MULTIVITAMIN Tabs Take 2 tablets by mouth daily after supper.     allopurinol 100 MG tablet Commonly known as:  ZYLOPRIM Take 200 mg by mouth daily after supper.   amLODipine 5 MG tablet Commonly known as:  NORVASC Take 1 tablet (5 mg total) by mouth daily.   aspirin 325 MG EC tablet Take 1 tablet (325 mg total) by mouth daily.   atorvastatin 40 MG tablet Commonly known as:  LIPITOR Take 1 tablet (40 mg total) by mouth daily at 6 PM.   buPROPion 150 MG 24 hr tablet Commonly known as:  WELLBUTRIN XL Take 150 mg by mouth daily.   glucose blood test strip Commonly known as:  FREESTYLE LITE USE AS INSTRUCTED TO TEST BLOOD SUGARS 3 TIMES DAILY. DX CODE-E11.65   insulin aspart 100 UNIT/ML injection Commonly known as:  NOVOLOG INJECT 85 UNITS SUBCUTANEOUSLY ONCE DAILY WITH  INSULIN  PUMP Dx Code E11.65   metoprolol tartrate 25 MG tablet Commonly known as:  LOPRESSOR Take 1 tablet (25 mg total) by mouth 2 (two) times daily.   nitroGLYCERIN 0.4 MG SL tablet Commonly known as:  NITROSTAT Place 1 tablet (0.4 mg total) under the tongue every 5 (five) minutes as needed for chest pain.   OMNIPOD 5 PACK Misc Place 1 each onto the skin as directed. Place 1 onto skin as directed every 48 hours   OSTEO BI-FLEX REGULAR STRENGTH PO Take 1 tablet by mouth daily after supper.   rOPINIRole 0.5 MG tablet Commonly known as:  REQUIP Take 1 tablet (0.5 mg total) by mouth at bedtime.   VICTOZA 18 MG/3ML Sopn Generic drug:  liraglutide Inject 1.2 mg into the skin. INJECT 1.2MG  UNDER THE SKIN ONCE DAILY.   Vitamin D 50 MCG (2000 UT) tablet Take 2,000 Units by mouth daily after supper.       Allergies: No Known Allergies  Past Medical History:  Diagnosis Date  . Anxiety   . Arthritis    "maybe in my hands" (07/15/2018)  . Bladder cancer ([place]) 2009  . CKD (chronic kidney disease), stage III ([place])   . Coronary artery disease   . Degenerative arthritis of shoulder region 08/2013   left  . Disorder of bursae and tendons in left shoulder region  08/2013  . Gout    "on daily RX" (07/15/2018)  . History of kidney stones   . Hyperlipidemia   . Hypertension    under control with meds., has been on med. >  10 yr.  . IDDM (insulin dependent diabetes mellitus) ([place])     Past Surgical History:  Procedure Laterality Date  . CARDIAC CATHETERIZATION  07/15/2018  . CHOLECYSTECTOMY  08/29/2012   Procedure: LAPAROSCOPIC CHOLECYSTECTOMY WITH INTRAOPERATIVE CHOLANGIOGRAM;  Surgeon: Imogene Burn. Tsuei, MD;  Location: [facility];  Service: General;  Laterality: N/A;  . CORONARY ARTERY BYPASS GRAFT N/A 07/20/2018   Procedure: CORONARY ARTERY BYPASS GRAFTING (CABG) times three using left internal mammary artery to the LAD, and using endoscopically harvested right saphenous vein to PDA and intermedius.;  Surgeon: Grace Isaac, MD;  Location: [facility];  Service: Open Heart Surgery;  Laterality: N/A;  . LEFT HEART CATH AND CORONARY ANGIOGRAPHY N/A 07/15/2018   Procedure: LEFT HEART CATH AND CORONARY ANGIOGRAPHY;  Surgeon: Leonie Man, MD;  Location: [facility];  Service: Cardiovascular;  Laterality: N/A;  . SHOULDER ARTHROSCOPY W/ ROTATOR CUFF REPAIR Right 2018  . SHOULDER ARTHROSCOPY WITH SUBACROMIAL DECOMPRESSION, ROTATOR CUFF REPAIR AND BICEP TENDON REPAIR Left 09/03/2013   Procedure: LEFT SHOULDER ARTHROSCOPY WITH EXTENSIVE DEBRIDMENT, DISTAL CLAVICULECTOMY, ROTATOR CUFF REPAIR AND SUBACROMIAL DECOMPRESSION PARTIAL ACRIOMIOPLASTY WITH CORACOACROMIAL RELEASE;  Surgeon: Renette Butters, MD;  Location: [facility];  Service: Orthopedics;  Laterality: Left;  . TEE WITHOUT CARDIOVERSION N/A 07/20/2018   Procedure: TRANSESOPHAGEAL ECHOCARDIOGRAM (TEE);  Surgeon: Grace Isaac, MD;  Location: [facility];  Service: Open Heart Surgery;  Laterality: N/A;  . TRANSURETHRAL RESECTION OF BLADDER TUMOR WITH MITOMYCIN-C  08/24/2008    Family History  Problem Relation Age of Onset  . Diabetes Mother   . Cancer Father   . Coronary artery  disease Father        MI at age 65.   . Sudden death Son   . CAD Maternal Grandfather        MI in his 22's  . CAD Paternal Grandfather     Social History:  reports that he quit smoking about 21 years ago. His smoking use included cigarettes. He has a 50.00 pack-year smoking history. He has never used smokeless tobacco. He reports previous alcohol use. He reports that he does not use drugs.    Review of Systems     HYPERTENSION: has been treated by his PCP with amlodipine 10 mg, and lisinopril HCT Blood pressure management also followed by nephrologist       Lipids: he is taking Lipitor 40 mg from his PCP His dose has been increased since his CABG      Lab Results  Component Value Date   CHOL 94 07/16/2018   HDL 25 (L) 07/16/2018   LDLCALC 45 07/16/2018   TRIG 120 07/16/2018   CHOLHDL 3.8 07/16/2018               CKD: He has been followed by nephrologist for diabetic nephropathy.   Last urine microalbumin ratio is normal His creatinine is mildly increased as before  His creatinine is stable follows:  Lab Results  Component Value Date   CREATININE 1.51 (H) 10/19/2018   CREATININE 1.62 (H) 07/23/2018   CREATININE 1.52 (H) 07/22/2018    Last foot exam in 09/2017  Has neuropathic symptoms controlled with low-dose gabapentin, also on Requip from PCP  Eye exam last:  1/19   Physical Examination:  BP 126/80 (BP Location: Left Arm, Patient Position: Sitting, Cuff Size: Normal)   Pulse 86   Ht 5\' 6"  (1.676 m)   Wt 199 lb 9.6 oz (90.5 kg)   SpO2 98%  BMI 32.22 kg/m        ASSESSMENT:  Diabetes type 2, insulin requiring  See history of present illness for detailed discussion of his current management, blood sugar patterns and problems identified  His A1c is higher at 7.5  His blood sugars are probably higher from overall decreased activity over the last 3 months He is also not checking readings after meals and not using the freestyle libre consistently No  consistent pattern seen as discussed above Day-to-day management of his insulin with the pump was discussed in detail   HYPERTENSION: Well controlled   CKD: Stable  Restless leg syndrome: He is on chronic treatment with ropinirole discussed that he should switch his prescription to his PCP who can monitor this treatment also  PLAN:    Needs to cut back on overall calorie intake to reduce his weight  Consider consultation with dietitian  He needs to probably do more proactive adjustment of his boluses with with different types of meals especially on weekends when he is eating larger meals or snacks  Also discussed that if his blood sugars are going up on his sensor in the afternoons on weekends he can take correction boluses  On the days he is more active in the afternoon he can reduce his basal rate by 50% for couple of hours to prevent low sugars especially when blood sugars are trending down  He will need to make sure he scans his blood sugar reading at night daily   There are no Patient Instructions on file for this visit.   Counseling time on subjects discussed in assessment and plan sections is over 50% of today's 25 minute visit   Elayne Snare 10/22/2018, 8:00 AM   Note: This office note was prepared with Dragon voice recognition system technology. Any transcriptional errors that result from this process are unintentional.

## 2018-10-22 ENCOUNTER — Ambulatory Visit (INDEPENDENT_AMBULATORY_CARE_PROVIDER_SITE_OTHER): Payer: Medicare Other | Admitting: Endocrinology

## 2018-10-22 ENCOUNTER — Encounter: Payer: Self-pay | Admitting: Endocrinology

## 2018-10-22 VITALS — BP 126/80 | HR 86 | Ht 66.0 in | Wt 199.6 lb

## 2018-10-22 DIAGNOSIS — I10 Essential (primary) hypertension: Secondary | ICD-10-CM

## 2018-10-22 DIAGNOSIS — Z794 Long term (current) use of insulin: Secondary | ICD-10-CM

## 2018-10-22 DIAGNOSIS — E1165 Type 2 diabetes mellitus with hyperglycemia: Secondary | ICD-10-CM | POA: Diagnosis not present

## 2018-10-22 DIAGNOSIS — E1121 Type 2 diabetes mellitus with diabetic nephropathy: Secondary | ICD-10-CM | POA: Diagnosis not present

## 2018-10-22 DIAGNOSIS — G2581 Restless legs syndrome: Secondary | ICD-10-CM

## 2018-10-22 NOTE — Patient Instructions (Signed)
Check blood sugars on waking up 7 days a week  Also check blood sugars about 2 hours after meals and do this after different meals by rotation  Recommended blood sugar levels on waking up are 90-130 and about 2 hours after meal is 130-160  Please bring your blood sugar monitor to each visit, thank you  Temp basal for more active times  Extra bolus on Sunday lunch and if sugar higher

## 2018-10-29 DIAGNOSIS — I2510 Atherosclerotic heart disease of native coronary artery without angina pectoris: Secondary | ICD-10-CM | POA: Diagnosis not present

## 2018-10-29 DIAGNOSIS — I129 Hypertensive chronic kidney disease with stage 1 through stage 4 chronic kidney disease, or unspecified chronic kidney disease: Secondary | ICD-10-CM | POA: Diagnosis not present

## 2018-10-29 DIAGNOSIS — N183 Chronic kidney disease, stage 3 (moderate): Secondary | ICD-10-CM | POA: Diagnosis not present

## 2018-10-29 DIAGNOSIS — F411 Generalized anxiety disorder: Secondary | ICD-10-CM | POA: Diagnosis not present

## 2018-10-29 DIAGNOSIS — E782 Mixed hyperlipidemia: Secondary | ICD-10-CM | POA: Diagnosis not present

## 2018-10-29 DIAGNOSIS — Z794 Long term (current) use of insulin: Secondary | ICD-10-CM | POA: Diagnosis not present

## 2018-10-29 DIAGNOSIS — M2550 Pain in unspecified joint: Secondary | ICD-10-CM | POA: Diagnosis not present

## 2018-10-29 DIAGNOSIS — G2581 Restless legs syndrome: Secondary | ICD-10-CM | POA: Diagnosis not present

## 2018-10-29 DIAGNOSIS — E1122 Type 2 diabetes mellitus with diabetic chronic kidney disease: Secondary | ICD-10-CM | POA: Diagnosis not present

## 2018-11-05 DIAGNOSIS — J209 Acute bronchitis, unspecified: Secondary | ICD-10-CM | POA: Diagnosis not present

## 2018-11-08 NOTE — Progress Notes (Signed)
Cardiology Office Note   Date:  11/10/2018   ID:  Alec [name], DOB [birth year], MRN [id number]  PCP:  Mayra Neer, MD  Cardiologist:   Minus Breeding, MD   Chief Complaint  Patient presents with  . Chest Pain      History of Present Illness: Alec [name] is a 73 y.o. male who presents for follow up of CAD status post CABG. he returns for follow-up and from a cardiovascular standpoint he is doing relatively well.  He has a cough productive of a little bit of a gray sputum.  He was seen in urgent care and treated with a Z-Pak and said he did not get better.  He is not describing fevers or chills.  He has had a little kind of vague discomfort in his left axillary or left upper chest area.  This is not similar to any previous angina that he had.  He otherwise was doing relatively well post bypass.  He has had some swelling in his hands and was briefly treated with methotrexate but Dr. Cyndia Bent did not want him to continue this.  He has not had any palpitations, presyncope or syncope.  He is having no leg swelling.  Past Medical History:  Diagnosis Date  . Anxiety   . Arthritis    "maybe in my hands" (07/15/2018)  . Bladder cancer ([place]) 2009  . CKD (chronic kidney disease), stage III ([place])   . Coronary artery disease   . Degenerative arthritis of shoulder region 08/2013   left  . Disorder of bursae and tendons in left shoulder region 08/2013  . Gout    "on daily RX" (07/15/2018)  . History of kidney stones   . Hyperlipidemia   . Hypertension    under control with meds., has been on med. > 10 yr.  . IDDM (insulin dependent diabetes mellitus) ([place])     Past Surgical History:  Procedure Laterality Date  . CARDIAC CATHETERIZATION  07/15/2018  . CHOLECYSTECTOMY  08/29/2012   Procedure: LAPAROSCOPIC CHOLECYSTECTOMY WITH INTRAOPERATIVE CHOLANGIOGRAM;  Surgeon: Imogene Burn. Tsuei, MD;  [facility];  Service: General;  Laterality: N/A;  . CORONARY ARTERY BYPASS GRAFT N/A  07/20/2018   Procedure: CORONARY ARTERY BYPASS GRAFTING (CABG) times three using left internal mammary artery to the LAD, and using endoscopically harvested right saphenous vein to PDA and intermedius.;  Surgeon: Grace Isaac, MD;  Location: [place];  Service: Open Heart Surgery;  Laterality: N/A;  . LEFT HEART CATH AND CORONARY ANGIOGRAPHY N/A 07/15/2018   Procedure: LEFT HEART CATH AND CORONARY ANGIOGRAPHY;  Surgeon: Leonie Man, MD;  Location: [place] [facility];  Service: Cardiovascular;  Laterality: N/A;  . SHOULDER ARTHROSCOPY W/ ROTATOR CUFF REPAIR Right 2018  . SHOULDER ARTHROSCOPY WITH SUBACROMIAL DECOMPRESSION, ROTATOR CUFF REPAIR AND BICEP TENDON REPAIR Left 09/03/2013   Procedure: LEFT SHOULDER ARTHROSCOPY WITH EXTENSIVE DEBRIDMENT, DISTAL CLAVICULECTOMY, ROTATOR CUFF REPAIR AND SUBACROMIAL DECOMPRESSION PARTIAL ACRIOMIOPLASTY WITH CORACOACROMIAL RELEASE;  Surgeon: Renette Butters, MD;  Location: [place];  Service: Orthopedics;  Laterality: Left;  . TEE WITHOUT CARDIOVERSION N/A 07/20/2018   Procedure: TRANSESOPHAGEAL ECHOCARDIOGRAM (TEE);  Surgeon: Grace Isaac, MD;  Location: [place];  Service: Open Heart Surgery;  Laterality: N/A;  . TRANSURETHRAL RESECTION OF BLADDER TUMOR WITH MITOMYCIN-C  08/24/2008     Current Outpatient Medications  Medication Sig Dispense Refill  . allopurinol (ZYLOPRIM) 100 MG tablet Take 200 mg by mouth daily after supper.    Marland Kitchen  amLODipine (NORVASC) 5 MG tablet Take 1 tablet (5 mg total) by mouth daily. 90 tablet 2  . aspirin 325 MG EC tablet Take 1 tablet (325 mg total) by mouth daily. 30 tablet 0  . atorvastatin (LIPITOR) 40 MG tablet Take 1 tablet (40 mg total) by mouth daily at 6 PM. 90 tablet 3  . buPROPion (WELLBUTRIN XL) 150 MG 24 hr tablet Take 150 mg by mouth daily.    . Cholecalciferol (VITAMIN D) 2000 units tablet Take 2,000 Units by mouth daily after supper.    . Glucosamine-Chondroitin (OSTEO BI-FLEX REGULAR  STRENGTH PO) Take 1 tablet by mouth daily after supper.    Marland Kitchen glucose blood (FREESTYLE LITE) test strip USE AS INSTRUCTED TO TEST BLOOD SUGARS 3 TIMES DAILY. DX CODE-E11.65 100 each 3  . insulin aspart (NOVOLOG) 100 UNIT/ML injection INJECT 85 UNITS SUBCUTANEOUSLY ONCE DAILY WITH  INSULIN  PUMP Dx Code E11.65 70 mL 3  . Insulin Disposable Pump (OMNIPOD 5 PACK) MISC Place 1 each onto the skin as directed. Place 1 onto skin as directed every 48 hours 15 each 3  . liraglutide (VICTOZA) 18 MG/3ML SOPN Inject 1.2 mg into the skin. INJECT 1.2MG  UNDER THE SKIN ONCE DAILY.    . metoprolol tartrate (LOPRESSOR) 25 MG tablet Take 1 tablet (25 mg total) by mouth 2 (two) times daily. 180 tablet 3  . Multiple Vitamins-Minerals (ADVANCED DIABETIC MULTIVITAMIN) TABS Take 2 tablets by mouth daily after supper.    Marland Kitchen rOPINIRole (REQUIP) 0.5 MG tablet Take 1 tablet (0.5 mg total) by mouth at bedtime. 15 tablet 0  . nitroGLYCERIN (NITROSTAT) 0.4 MG SL tablet Place 1 tablet (0.4 mg total) under the tongue every 5 (five) minutes as needed for chest pain. 30 tablet 1   No current facility-administered medications for this visit.     Allergies:   Patient has no known allergies.     ROS:  Please see the history of present illness.   Otherwise, review of systems are positive for none.   All other systems are reviewed and negative.    PHYSICAL EXAM: VS:  BP 140/72   Pulse 67   Ht 5\' 6"  (1.676 m)   Wt 197 lb 12.8 oz (89.7 kg)   BMI 31.93 kg/m  , BMI Body mass index is 31.93 kg/m. GENERAL:  Well appearing NECK:  No jugular venous distention, waveform within normal limits, carotid upstroke brisk and symmetric, no bruits, no thyromegaly LUNGS:  Diffuse rhonchi and scattered wheezing. CHEST:  Well healed sternotomy scar. HEART:  PMI not displaced or sustained,S1 and S2 within normal limits, no S3, no S4, no clicks, no rubs, no murmurs ABD:  Flat, positive bowel sounds normal in frequency in pitch, no bruits, no  rebound, no guarding, no midline pulsatile mass, no hepatomegaly, no splenomegaly EXT:  2 plus pulses throughout, no edema, no cyanosis no clubbing   EKG:  EKG is not ordered today.  Obtaining  Recent Labs: 07/14/2018: TSH 2.520 07/21/2018: Magnesium 2.3 07/23/2018: Hemoglobin 10.3; Platelets 125 10/19/2018: ALT 19; BUN 13; Creatinine, Ser 1.51; Potassium 4.3; Sodium 141    Lipid Panel    Component Value Date/Time   CHOL 94 07/16/2018 0731   TRIG 120 07/16/2018 0731   HDL 25 (L) 07/16/2018 0731   CHOLHDL 3.8 07/16/2018 0731   VLDL 24 07/16/2018 0731   LDLCALC 45 07/16/2018 0731   Lab Results  Component Value Date   CREATININE 1.51 (H) 10/19/2018   Lab Results  Component Value  Date   HGBA1C 7.5 (H) 10/19/2018     Wt Readings from Last 3 Encounters:  11/10/18 197 lb 12.8 oz (89.7 kg)  10/22/18 199 lb 9.6 oz (90.5 kg)  08/24/18 189 lb (85.7 kg)      Other studies Reviewed: Additional studies/ records that were reviewed today include:  None Review of the above records demonstrates:  NA   ASSESSMENT AND PLAN:  CAD:     The patient has no new sypmtoms.  No further cardiovascular testing is indicated.  We will continue with aggressive risk reduction and meds as listed.  I will reduce him to 81 mg po daily.   HTN:  The blood pressure is well controlled.  Continue current meds.   HYPERLIPIDEMIA:     Lipids are good with an LDL of 52.  No change in therapy.  DM:   A1C was as above.  7.5.  He is being managed by Mayra Neer, MD   CKD III:   Creat is elevated and stable at 1.54.  He is followed by Dr. Posey Pronto.      WHEEZING/COUGH: He has had wheezing and a cough not responsive to his Z-Pak.  I will get a chest x-ray and will discuss further with Mayra Neer, MD   Current medicines are reviewed at length with the patient today.  The patient does not have concerns regarding medicines.  The following changes have been made:  None  Labs/ tests ordered today  include:    Orders Placed This Encounter  Procedures  . DG Chest 2 View     Disposition:   FU with Kerin Ransom PAC in six months.    Signed, Minus Breeding, MD  11/10/2018 5:20 PM    [facility]

## 2018-11-10 ENCOUNTER — Encounter: Payer: Self-pay | Admitting: Cardiology

## 2018-11-10 ENCOUNTER — Ambulatory Visit (INDEPENDENT_AMBULATORY_CARE_PROVIDER_SITE_OTHER): Payer: Medicare Other | Admitting: Cardiology

## 2018-11-10 VITALS — BP 140/72 | HR 67 | Ht 66.0 in | Wt 197.8 lb

## 2018-11-10 DIAGNOSIS — I10 Essential (primary) hypertension: Secondary | ICD-10-CM | POA: Diagnosis not present

## 2018-11-10 DIAGNOSIS — R062 Wheezing: Secondary | ICD-10-CM

## 2018-11-10 DIAGNOSIS — E785 Hyperlipidemia, unspecified: Secondary | ICD-10-CM | POA: Diagnosis not present

## 2018-11-10 DIAGNOSIS — R0602 Shortness of breath: Secondary | ICD-10-CM | POA: Diagnosis not present

## 2018-11-10 DIAGNOSIS — R05 Cough: Secondary | ICD-10-CM

## 2018-11-10 DIAGNOSIS — I2510 Atherosclerotic heart disease of native coronary artery without angina pectoris: Secondary | ICD-10-CM

## 2018-11-10 DIAGNOSIS — R059 Cough, unspecified: Secondary | ICD-10-CM

## 2018-11-10 NOTE — Patient Instructions (Addendum)
Medication Instructions:  Continue current medications  If you need a refill on your cardiac medications before your next appointment, please call your pharmacy.  Labwork: None Ordered  Take the provided lab slips with you to the lab for your blood draw.   When you have your labs (blood work) drawn today and your tests are completely normal, you will receive your results only by MyChart Message (if you have MyChart) -OR-  A paper copy in the mail.  If you have any lab test that is abnormal or we need to change your treatment, we will call you to review these results.  Testing/Procedures: A chest x-ray takes a picture of the organs and structures inside the chest, including the heart, lungs, and blood vessels. This test can show several things, including, whether the heart is enlarges; whether fluid is building up in the lungs; and whether pacemaker / defibrillator leads are still in place.  Follow-Up: . You will need a follow up appointment in 6 months with [facility], you and your health needs are our priority.  As part of our continuing mission to provide you with exceptional heart care, we have created designated Provider Care Teams.  These Care Teams include your primary Cardiologist (physician) and Advanced Practice Providers (APPs -  Physician Assistants and Nurse Practitioners) who all work together to provide you with the care you need, when you need it.   Thank you for choosing [facility] at [facility]!!

## 2018-11-12 ENCOUNTER — Ambulatory Visit
Admission: RE | Admit: 2018-11-12 | Discharge: 2018-11-12 | Disposition: A | Discharge status: Home / Self Care | Payer: Medicare Other | Source: Ambulatory Visit | Attending: Cardiology | Admitting: Cardiology

## 2018-11-12 DIAGNOSIS — R05 Cough: Secondary | ICD-10-CM | POA: Diagnosis not present

## 2018-11-12 DIAGNOSIS — R0602 Shortness of breath: Secondary | ICD-10-CM

## 2018-11-12 DIAGNOSIS — R059 Cough, unspecified: Secondary | ICD-10-CM

## 2018-11-12 DIAGNOSIS — R062 Wheezing: Secondary | ICD-10-CM

## 2018-11-13 ENCOUNTER — Telehealth: Payer: Self-pay | Admitting: Cardiology

## 2018-11-13 NOTE — Telephone Encounter (Signed)
Pt aware of his chest xray

## 2018-11-13 NOTE — Telephone Encounter (Signed)
New Message   PT is returning phone call for Alec [name] Please call back

## 2018-11-18 ENCOUNTER — Telehealth: Payer: Self-pay | Admitting: Endocrinology

## 2018-11-18 NOTE — Telephone Encounter (Signed)
Patient called re: [employer] will NOT cover Omnipod. unless authorization is sent to [facility] by Dr. Dwyane Dee. If problems Medicare will walk our office through it. (since authorization has run out patient paying over $300 per month). If questions please call patient at ph# [phone].

## 2018-11-19 NOTE — Telephone Encounter (Signed)
PA will be completed for pt.

## 2018-12-09 ENCOUNTER — Other Ambulatory Visit: Payer: Self-pay

## 2018-12-09 MED ORDER — OMNIPOD CLASSIC PODS (GEN 3) MISC: 1.0000 | 11 refills | Status: DC

## 2018-12-09 NOTE — Telephone Encounter (Signed)
PA for OmniPods were approved. Key: [id number]  PA Case ID: [id number]

## 2018-12-09 NOTE — Telephone Encounter (Signed)
PA submitted for Omnipod 5 pack via [email or web address] KEY: [id number]

## 2018-12-09 NOTE — Telephone Encounter (Signed)
Patient was notified.

## 2018-12-10 ENCOUNTER — Telehealth: Payer: Self-pay | Admitting: Endocrinology

## 2018-12-10 DIAGNOSIS — N183 Chronic kidney disease, stage 3 (moderate): Secondary | ICD-10-CM | POA: Diagnosis not present

## 2018-12-10 DIAGNOSIS — N189 Chronic kidney disease, unspecified: Secondary | ICD-10-CM | POA: Diagnosis not present

## 2018-12-10 DIAGNOSIS — N2581 Secondary hyperparathyroidism of renal origin: Secondary | ICD-10-CM | POA: Diagnosis not present

## 2018-12-10 NOTE — Telephone Encounter (Signed)
Called and spoke with rep from this pharmacy and they stated that it was run through again and is now covered and the RX has been filled.

## 2018-12-10 NOTE — Telephone Encounter (Signed)
PA was approved yesterday during normal business hours.

## 2018-12-10 NOTE — Telephone Encounter (Signed)
Alecia from [facility] reached [facility] last night @ 10:23pm Stating that the patients Omnipods require a PA.  KEY CODE- [id number]

## 2018-12-15 DIAGNOSIS — N183 Chronic kidney disease, stage 3 (moderate): Secondary | ICD-10-CM | POA: Diagnosis not present

## 2018-12-15 DIAGNOSIS — D631 Anemia in chronic kidney disease: Secondary | ICD-10-CM | POA: Diagnosis not present

## 2018-12-15 DIAGNOSIS — N2581 Secondary hyperparathyroidism of renal origin: Secondary | ICD-10-CM | POA: Diagnosis not present

## 2018-12-15 DIAGNOSIS — I129 Hypertensive chronic kidney disease with stage 1 through stage 4 chronic kidney disease, or unspecified chronic kidney disease: Secondary | ICD-10-CM | POA: Diagnosis not present

## 2018-12-24 ENCOUNTER — Other Ambulatory Visit: Payer: Self-pay

## 2018-12-24 MED ORDER — GLUCOSE BLOOD VI STRP: ORAL_STRIP | 3 refills | Status: DC

## 2018-12-30 DIAGNOSIS — M109 Gout, unspecified: Secondary | ICD-10-CM | POA: Diagnosis not present

## 2018-12-30 DIAGNOSIS — I129 Hypertensive chronic kidney disease with stage 1 through stage 4 chronic kidney disease, or unspecified chronic kidney disease: Secondary | ICD-10-CM | POA: Diagnosis not present

## 2019-01-04 ENCOUNTER — Telehealth: Payer: Self-pay | Admitting: Endocrinology

## 2019-01-04 NOTE — Telephone Encounter (Signed)
[facility] called they are following up on a fax sent 12/29/2018 regarding visit notes for this patient

## 2019-01-05 NOTE — Telephone Encounter (Signed)
Notes were faxed yesterday.

## 2019-01-06 DIAGNOSIS — M2550 Pain in unspecified joint: Secondary | ICD-10-CM | POA: Diagnosis not present

## 2019-01-06 DIAGNOSIS — M0609 Rheumatoid arthritis without rheumatoid factor, multiple sites: Secondary | ICD-10-CM | POA: Diagnosis not present

## 2019-01-06 DIAGNOSIS — M1A09X0 Idiopathic chronic gout, multiple sites, without tophus (tophi): Secondary | ICD-10-CM | POA: Diagnosis not present

## 2019-01-06 DIAGNOSIS — M7989 Other specified soft tissue disorders: Secondary | ICD-10-CM | POA: Diagnosis not present

## 2019-01-11 ENCOUNTER — Other Ambulatory Visit: Payer: Self-pay | Admitting: Endocrinology

## 2019-01-11 DIAGNOSIS — M0609 Rheumatoid arthritis without rheumatoid factor, multiple sites: Secondary | ICD-10-CM | POA: Diagnosis not present

## 2019-01-19 ENCOUNTER — Other Ambulatory Visit: Payer: Self-pay

## 2019-01-19 ENCOUNTER — Other Ambulatory Visit (INDEPENDENT_AMBULATORY_CARE_PROVIDER_SITE_OTHER): Payer: Medicare Other

## 2019-01-19 DIAGNOSIS — E1165 Type 2 diabetes mellitus with hyperglycemia: Secondary | ICD-10-CM | POA: Diagnosis not present

## 2019-01-19 DIAGNOSIS — Z794 Long term (current) use of insulin: Secondary | ICD-10-CM | POA: Diagnosis not present

## 2019-01-19 LAB — BASIC METABOLIC PANEL
BUN: 18 mg/dL (ref 6–23)
CO2: 26 mEq/L (ref 19–32)
Calcium: 9.8 mg/dL (ref 8.4–10.5)
Chloride: 103 mEq/L (ref 96–112)
Creatinine, Ser: 1.51 mg/dL — ABNORMAL HIGH (ref 0.40–1.50)
GFR: 45.85 mL/min — ABNORMAL LOW (ref >60.00)
Glucose, Bld: 139 mg/dL — ABNORMAL HIGH (ref 70–99)
Potassium: 4.2 mEq/L (ref 3.5–5.1)
Sodium: 137 mEq/L (ref 135–145)

## 2019-01-19 LAB — HEMOGLOBIN A1C: Hgb A1c MFr Bld: 8.5 % — ABNORMAL HIGH (ref 4.6–6.5)

## 2019-01-22 ENCOUNTER — Encounter: Payer: Self-pay | Admitting: Endocrinology

## 2019-01-22 ENCOUNTER — Ambulatory Visit (INDEPENDENT_AMBULATORY_CARE_PROVIDER_SITE_OTHER): Payer: Medicare Other | Admitting: Endocrinology

## 2019-01-22 ENCOUNTER — Other Ambulatory Visit: Payer: Self-pay

## 2019-01-22 DIAGNOSIS — Z794 Long term (current) use of insulin: Secondary | ICD-10-CM

## 2019-01-22 DIAGNOSIS — E1165 Type 2 diabetes mellitus with hyperglycemia: Secondary | ICD-10-CM | POA: Diagnosis not present

## 2019-01-22 NOTE — Progress Notes (Signed)
Patient ID: Alec [name], male   DOB: Jun 15, 1946, 73 y.o.   MRN: [id number]             Reason for Appointment:  Follow-up for Type 2 Diabetes   Today's office visit was provided via telemedicine using audio phone call Explained to the patient and the the limitations of evaluation and management by telemedicine and the availability of in person appointments.  The patient understood the limitations and agreed to proceed. Patient also understood that the telehealth visit is billable. . Location of the patient: Home . Location of the provider: Office Only the patient and myself were participating in the encounter  Patient data including pump download and freestyle libre download were reviewed prior to the phone call as well as relevant section of his medical records   History of Present Illness:          Diagnosis: Type 2 diabetes mellitus, date of diagnosis: 2001       Prior history: He was seen in consultation in 11/2014 when his A1c was 7.4 Since his blood sugars were averaging over 200 he was given Victoza in addition to his basal bolus insulin regimen in 6/16 He started on the V go pump in October 2016  Recent history:    INSULIN regimen is described as:  BASAL rate on Omnipod pump 2.2 over 24 hours  Boluses 12 units lunch and dinner and 14.5 units before breakfast   Non-insulin hypoglycemic drugs the patient is taking are: Victoza 1.2 mg daily   His A1c is higher at 8.5, has been as low as 6.8 previously 7.5 compared to 7 and 6.8  Current blood sugar patterns from analysis of his meter download, daily management and problems identified:  He could not use his freestyle libre in the last few days because of his sensor falling off  Usually using skin tac and does not have this problem often  Analysis of his CGM as detailed below  He is still working somewhat and his mealtimes are at variable times  Currently his pump download is not available and blood sugar patterns  reviewed only from his freestyle libre sensor  BOLUSES at mealtimes are difficult to assess without pump data, he thinks he is trying to bolus before meals consistently  Did have a couple of hypoglycemic episodes late afternoon possibly from correcting high readings but not recently  Currently able to continue Victoza but is concerned about the cost  Also concern out of the cost of his pump  Side effects from medications have been:none  Exercise: No formal exercise  Glucose monitoring:  Freestyle libre   CONTINUOUS GLUCOSE MONITORING RECORD INTERPRETATION    Dates of Recording: 3/26 through 01/06/2019  Sensor description: [employer]  Results statistics:   CGM use % of time  86  Average and SD  182  Time in range       54 %  % Time Above 180  26  % Time above 250  18  % Time Below target  2    Glycemic patterns summary:   Hyperglycemic episodes are occurring mostly after his evening meal between about 6 PM-10 PM. Also has occasional hypoglycemic episodes after lunch around 2 PM as well as rarely has persistently high readings through the night  Hypoglycemic episodes occurred once on 3/27 midafternoon and also once on 3/31 before dinnertime preceded by hyperglycemia on both occasions  Overnight periods: Blood sugars start off with an average of 180 on midnight and  start declining gradually only after about 2:30 AM but morning average is still relatively high at 151 with some variability  Preprandial periods: Fasting glucose can vary between near normal up to about 170 at breakfast time Blood sugars midday at lunch fluctuate and are close to 190 Blood sugars are also variable at dinnertime and averaging about 170  POSTPRANDIAL patterns: Blood sugars after breakfast are only occasionally higher and may be significantly high by lunchtime Afternoon blood sugars after lunch show relatively high readings only on 3 occasions in the last 2 weeks Most of his blood sugars  after evening meal are significantly high, limited data available on some days and has some variability also         Previous readings:    PRE-MEAL Fasting Lunch Dinner Bedtime Overall  Glucose range:  80-196   54-289    Averages:  162  140  164   160    Self-care:  Meals: 3 meals per day. Lunch  1 pm Dinner 6 pm Breakfast is usually an egg sandwich but sometimes biscuits   Eating balanced meals in the evening.  He will have snacks with granola bars              Dietician visit, most recent: 6/15.               Weight history:  Wt Readings from Last 3 Encounters:  11/10/18 197 lb 12.8 oz (89.7 kg)  10/22/18 199 lb 9.6 oz (90.5 kg)  08/24/18 189 lb (85.7 kg)    Glycemic control:    Lab Results  Component Value Date   HGBA1C 8.5 (H) 01/19/2019   HGBA1C 7.5 (H) 10/19/2018   HGBA1C 7.0 (H) 07/20/2018   Lab Results  Component Value Date   MICROALBUR 5.9 (H) 06/16/2018   LDLCALC 45 07/16/2018   CREATININE 1.51 (H) 01/19/2019     Past history:  He was not symptomatic at time of diagnosis and was probably treated with Amaryl only.  No details are available of previous management However he thinks he was not given metformin at any time because of his "kidney problem" He had a A1c of over 9% in 2013 and may have been started on insulin at that time. Most likely has been taking Levemir and NovoLog since then A1c records show his results were over 8% in 2014 and in 4/15 but subsequently down to 7.1 in November 2015 Amaryl was stopped in 7/16  Other active problems are addressed in Review of systems   Allergies as of 01/22/2019   No Known Allergies     Medication List       Accurate as of January 22, 2019  8:13 AM. Always use your most recent med list.        Advanced Diabetic Multivitamin Tabs Take 2 tablets by mouth daily after supper.   allopurinol 100 MG tablet Commonly known as:  ZYLOPRIM Take 200 mg by mouth daily after supper.   amLODipine 5 MG tablet  Commonly known as:  NORVASC Take 1 tablet (5 mg total) by mouth daily.   aspirin 325 MG EC tablet Take 1 tablet (325 mg total) by mouth daily.   atorvastatin 40 MG tablet Commonly known as:  LIPITOR Take 1 tablet (40 mg total) by mouth daily at 6 PM.   buPROPion 150 MG 24 hr tablet Commonly known as:  WELLBUTRIN XL Take 150 mg by mouth daily.   glucose blood test strip Commonly known as:  FREESTYLE LITE  USE AS INSTRUCTED TO TEST BLOOD SUGARS 3 TIMES DAILY. DX CODE-E11.65   insulin aspart 100 UNIT/ML injection Commonly known as:  NovoLOG INJECT 85 UNITS SUBCUTANEOUSLY ONCE DAILY WITH  INSULIN  PUMP Dx Code E11.65   metoprolol tartrate 25 MG tablet Commonly known as:  LOPRESSOR Take 1 tablet (25 mg total) by mouth 2 (two) times daily.   nitroGLYCERIN 0.4 MG SL tablet Commonly known as:  NITROSTAT Place 1 tablet (0.4 mg total) under the tongue every 5 (five) minutes as needed for chest pain.   OmniPod 5 Pack Misc Place 1 each onto the skin as directed. Place 1 onto skin as directed every 48 hours   OSTEO BI-FLEX REGULAR STRENGTH PO Take 1 tablet by mouth daily after supper.   rOPINIRole 0.5 MG tablet Commonly known as:  REQUIP Take 1 tablet (0.5 mg total) by mouth at bedtime.   Victoza 18 MG/3ML Sopn Generic drug:  liraglutide INJECT 1.8 MG SUBCUTANEOUSLY ONCE DAILY AT THE SAME TIME EVERYDAY   Vitamin D 50 MCG (2000 UT) tablet Take 2,000 Units by mouth daily after supper.       Allergies: No Known Allergies  Past Medical History:  Diagnosis Date  . Anxiety   . Arthritis    "maybe in my hands" (07/15/2018)  . Bladder cancer ([place]) 2009  . CKD (chronic kidney disease), stage III ([place])   . Coronary artery disease   . Degenerative arthritis of shoulder region 08/2013   left  . Disorder of bursae and tendons in left shoulder region 08/2013  . Gout    "on daily RX" (07/15/2018)  . History of kidney stones   . Hyperlipidemia   . Hypertension    under control  with meds., has been on med. > 10 yr.  . IDDM (insulin dependent diabetes mellitus) ([place])     Past Surgical History:  Procedure Laterality Date  . CARDIAC CATHETERIZATION  07/15/2018  . CHOLECYSTECTOMY  08/29/2012   Procedure: LAPAROSCOPIC CHOLECYSTECTOMY WITH INTRAOPERATIVE CHOLANGIOGRAM;  Surgeon: Imogene Burn. Tsuei, MD;  [facility];  Service: General;  Laterality: N/A;  . CORONARY ARTERY BYPASS GRAFT N/A 07/20/2018   Procedure: CORONARY ARTERY BYPASS GRAFTING (CABG) times three using left internal mammary artery to the LAD, and using endoscopically harvested right saphenous vein to PDA and intermedius.;  Surgeon: Grace Isaac, MD;  Location: [place];  Service: Open Heart Surgery;  Laterality: N/A;  . LEFT HEART CATH AND CORONARY ANGIOGRAPHY N/A 07/15/2018   Procedure: LEFT HEART CATH AND CORONARY ANGIOGRAPHY;  Surgeon: Leonie Man, MD;  Location: [place] [facility];  Service: Cardiovascular;  Laterality: N/A;  . SHOULDER ARTHROSCOPY W/ ROTATOR CUFF REPAIR Right 2018  . SHOULDER ARTHROSCOPY WITH SUBACROMIAL DECOMPRESSION, ROTATOR CUFF REPAIR AND BICEP TENDON REPAIR Left 09/03/2013   Procedure: LEFT SHOULDER ARTHROSCOPY WITH EXTENSIVE DEBRIDMENT, DISTAL CLAVICULECTOMY, ROTATOR CUFF REPAIR AND SUBACROMIAL DECOMPRESSION PARTIAL ACRIOMIOPLASTY WITH CORACOACROMIAL RELEASE;  Surgeon: Renette Butters, MD;  Location: [place];  Service: Orthopedics;  Laterality: Left;  . TEE WITHOUT CARDIOVERSION N/A 07/20/2018   Procedure: TRANSESOPHAGEAL ECHOCARDIOGRAM (TEE);  Surgeon: Grace Isaac, MD;  Location: [place];  Service: Open Heart Surgery;  Laterality: N/A;  . TRANSURETHRAL RESECTION OF BLADDER TUMOR WITH MITOMYCIN-C  08/24/2008    Family History  Problem Relation Age of Onset  . Diabetes Mother   . Cancer Father   . Coronary artery disease Father        MI at age 2.   Marland Kitchen  Sudden death Son   . CAD Maternal Grandfather        MI in his 32's  . CAD Paternal  Grandfather     Social History:  reports that he quit smoking about 21 years ago. His smoking use included cigarettes. He has a 50.00 pack-year smoking history. He has never used smokeless tobacco. He reports previous alcohol use. He reports that he does not use drugs.    Review of Systems     HYPERTENSION: has been treated by his PCP with amlodipine 10 mg, and lisinopril HCT Blood pressure also followed by nephrologist       Lipids: he is taking Lipitor 40 mg from his PCP His dose has been increased since his CABG      Lab Results  Component Value Date   CHOL 94 07/16/2018   HDL 25 (L) 07/16/2018   LDLCALC 45 07/16/2018   TRIG 120 07/16/2018   CHOLHDL 3.8 07/16/2018               CKD: He has been followed by nephrologist for diabetic nephropathy.   Last urine microalbumin ratio is normal His creatinine is mildly increased and stable   Lab Results  Component Value Date   CREATININE 1.51 (H) 01/19/2019   CREATININE 1.51 (H) 10/19/2018   CREATININE 1.62 (H) 07/23/2018    Last foot exam in 09/2017  Has neuropathic symptoms controlled with low-dose gabapentin, also on Requip from PCP  Eye exam last:  1/19   Physical Examination:  There were no vitals taken for this visit.       ASSESSMENT:  Diabetes type 2, insulin requiring  See history of present illness for detailed discussion of his current management, blood sugar patterns and problems identified  His A1c is higher at 8.5 compared to 7.5  His blood sugars are averaging about 180 from the information that was available from his freestyle libre but likely his blood sugars have been higher and not clear why His highest blood sugars are after dinner and early part of the night He does not have his pump data available for review  HYPERTENSION: Well controlled   CKD: Stable  PLAN: His blood sugar  data need to be updated on Monday to help adjust his basal rates However most likely will need to increase his  basal rate from midnight to 4 AM up to 2.2 and 6 PM to midnight up to 2.4 Also he will take additional 4 units bolus for his evening meal by entering 180 g of carbohydrate instead of 140. He was given the phone number to call for getting patient assistance for Victoza and also check with insurance about alternatives which will be less expensive Patient message sent through my chart regarding this We will need to reassess his control in 6 weeks if he is able to bring his meter and pump for download at that time  We will try to get an armband for his freestyle libre through [place] so he can keep it on consistently    There are no Patient Instructions on file for this visit.   Total visit time for today's telehealth visit =22 minutes   Elayne Snare 01/22/2019, 8:13 AM   Note: This office note was prepared with Dragon voice recognition system technology. Any transcriptional errors that result from this process are unintentional.

## 2019-02-01 ENCOUNTER — Telehealth: Payer: Self-pay | Admitting: Nutrition

## 2019-02-01 ENCOUNTER — Other Ambulatory Visit: Payer: Self-pay | Admitting: Endocrinology

## 2019-02-01 DIAGNOSIS — Z794 Long term (current) use of insulin: Principal | ICD-10-CM

## 2019-02-01 DIAGNOSIS — E1165 Type 2 diabetes mellitus with hyperglycemia: Secondary | ICD-10-CM

## 2019-02-01 NOTE — Telephone Encounter (Signed)
Patient was called to make changes to his bolus and basal rate settings.  He did not sound confident that he could do this.  I agreed to meet him at the car to reteach him how to do this and to download his pump.  Appointment scheduled for Wednesday

## 2019-02-03 ENCOUNTER — Encounter: Payer: Medicare Other | Attending: Endocrinology | Admitting: Nutrition

## 2019-02-03 DIAGNOSIS — E1121 Type 2 diabetes mellitus with diabetic nephropathy: Secondary | ICD-10-CM | POA: Insufficient documentation

## 2019-02-03 NOTE — Progress Notes (Signed)
Patient is here to review his basal settings and to teach him to make the changes to his basal rates.   Mr. Dulworth showed me how he boluses, and he is doing this correctly. However, He is not bolusing for his bedtime snack that he takes every night (cake and milk, popcorn, cookies and milk).  It is programed into his presets to give 4 units for this.discussed the importance of giving a bolus for his bedtime snacks--to keep his blood sugars from rising after supper).  Pump download shows that he did not make any changes to his basal rate.   Dr. Dwyane Dee was shown the download, and new changes were made to his basal rates, per the new download, and information about his not bolusing for HS snacks. New bolus amount for supper put into the PDM:  180g, per Dr. [name] Derby voice order Bolus settings:  Bfast: 14.5u,  Lunch: 12u, Supper: 18u.   Basal rate changes were done by the patient with some assistance by me. New basal rates:  MN: 2.4, 4AM: 2.2, 6PM: 2.6, 10PM: 2.4

## 2019-02-03 NOTE — Patient Instructions (Signed)
Always take insulin for your snack at bedtime.

## 2019-02-08 IMAGING — DX DG CHEST 2V
2 series · 2 of 2 positions shown · non-contrast
Comparison: 01/03/2009

CLINICAL DATA: Preoperative evaluation

EXAM:
CHEST - 2 VIEW

[chest pa]
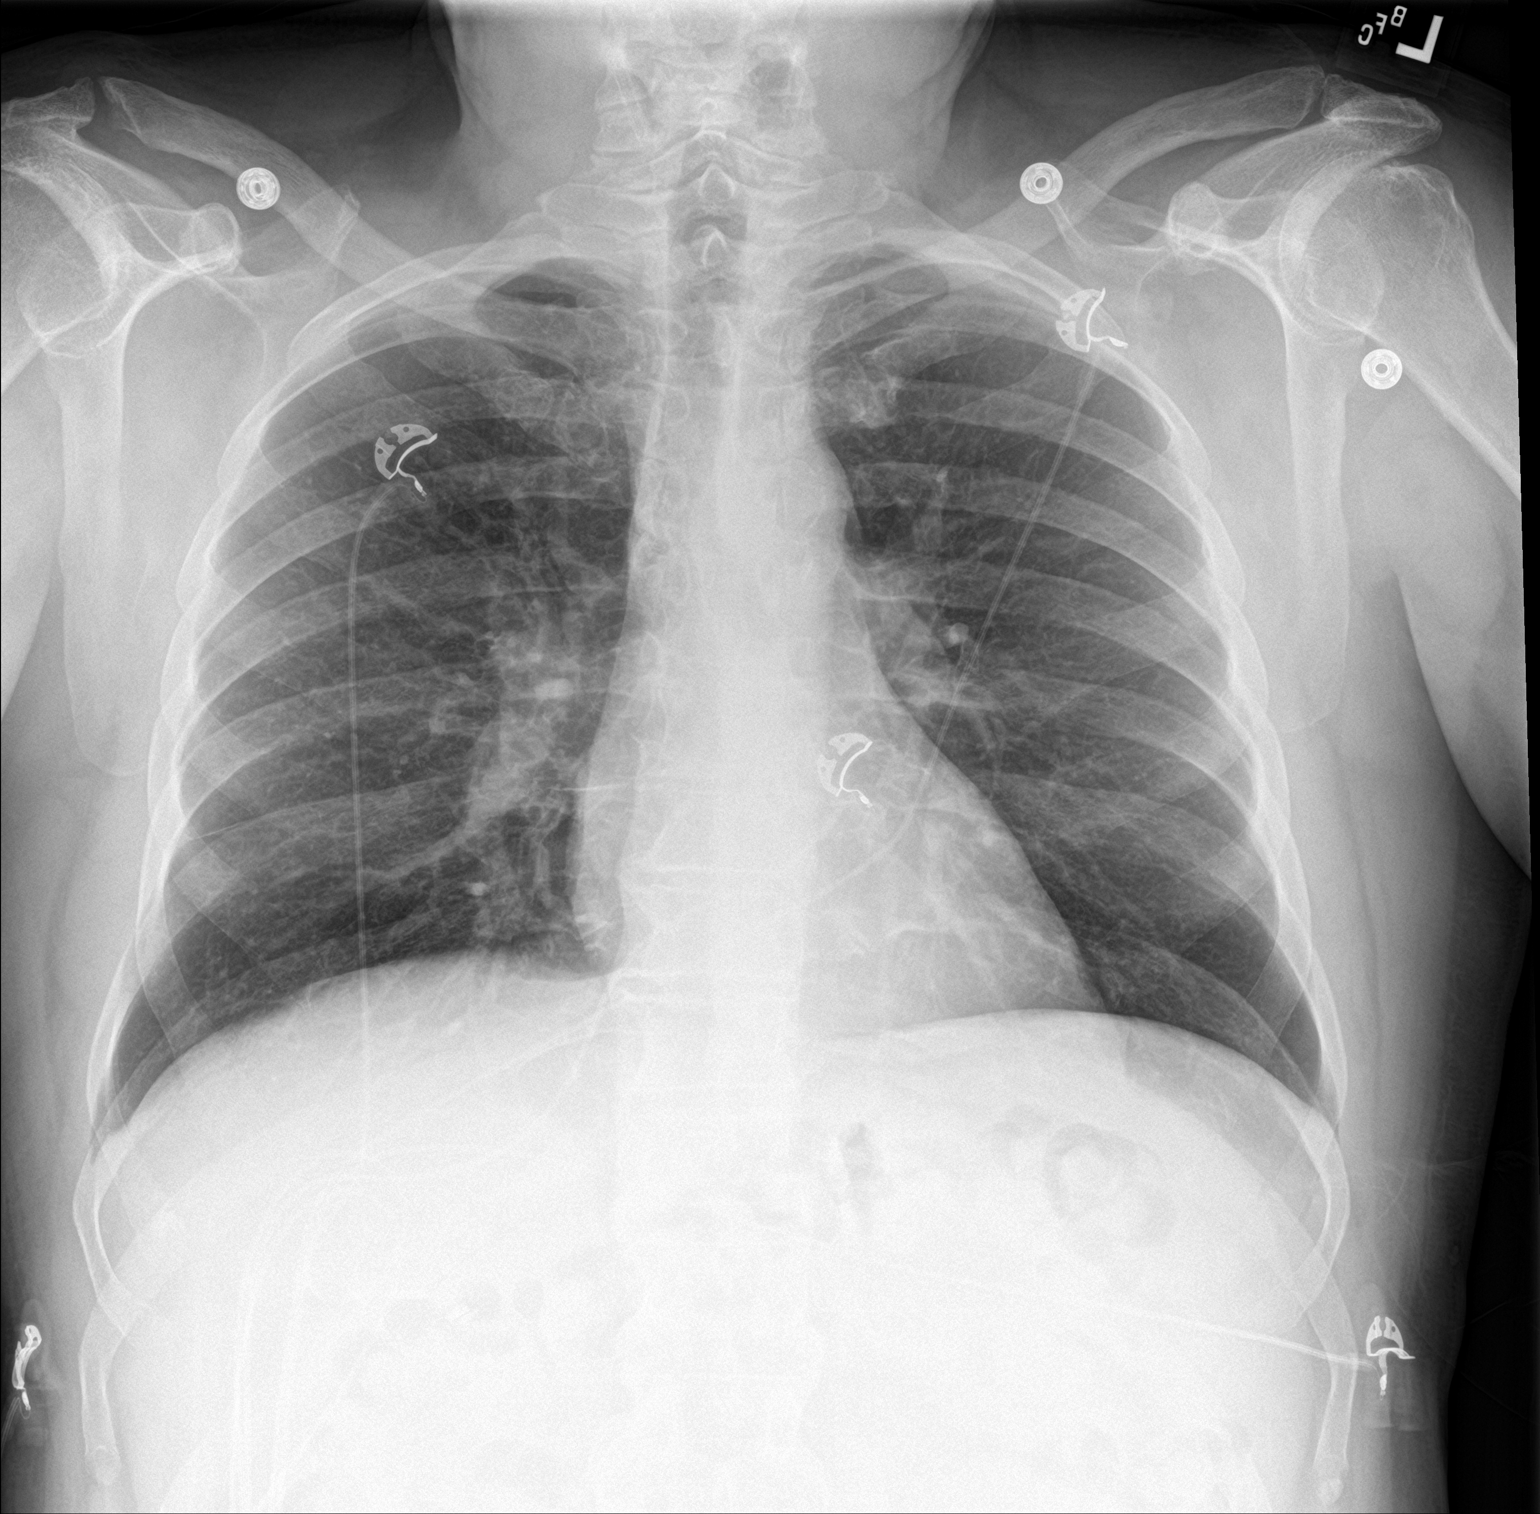

[chest lat]
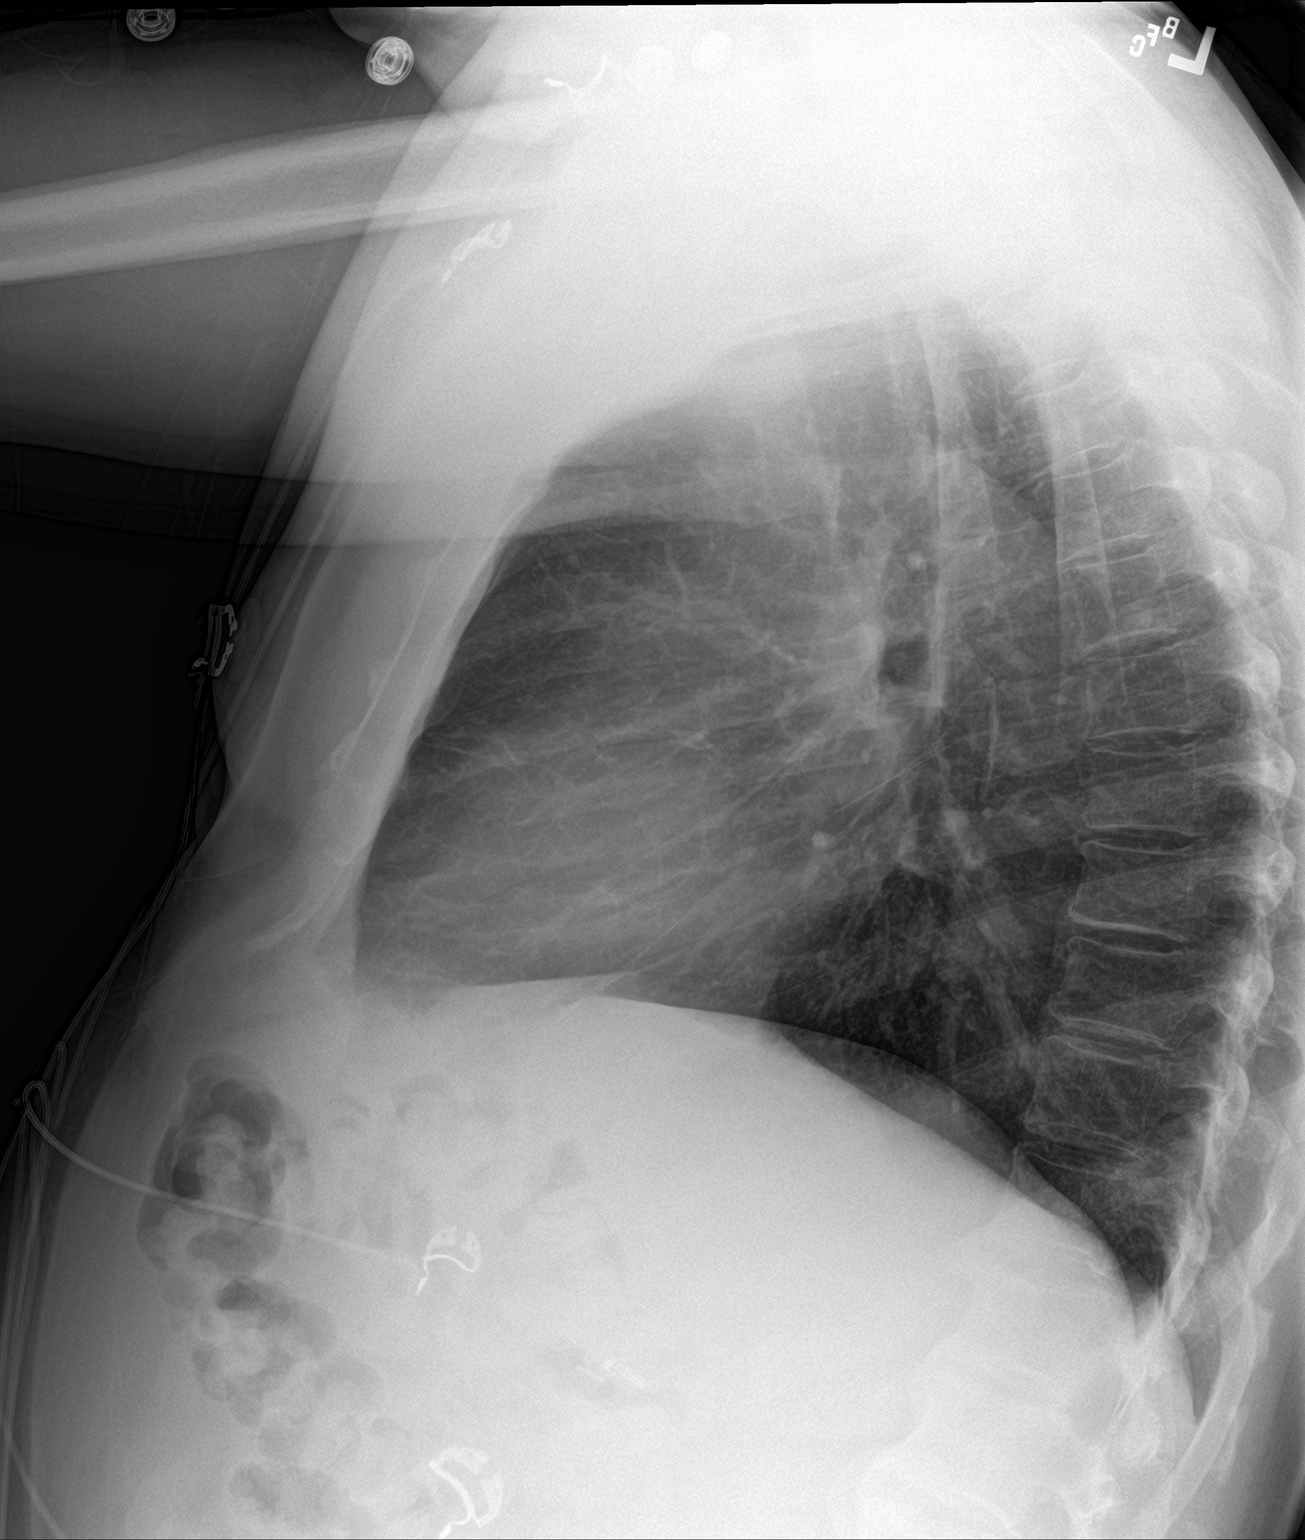

[2 of 2 positions shown; findings below may reference images not displayed]

FINDINGS: The heart size and mediastinal contours are within normal limits.
Both lungs are clear. The visualized skeletal structures are
unremarkable.
IMPRESSION: No active cardiopulmonary disease.

## 2019-02-12 IMAGING — CR DG CHEST 1V PORT
1 series · 1 of 1 positions shown · non-contrast
Comparison: Preoperative chest x-ray 07/16/2018

CLINICAL DATA: 72-year-old male status post CABG. Evaluate for
retained foreign bodies given incorrect instrument count.

EXAM:
PORTABLE CHEST 1 VIEW

[AP]
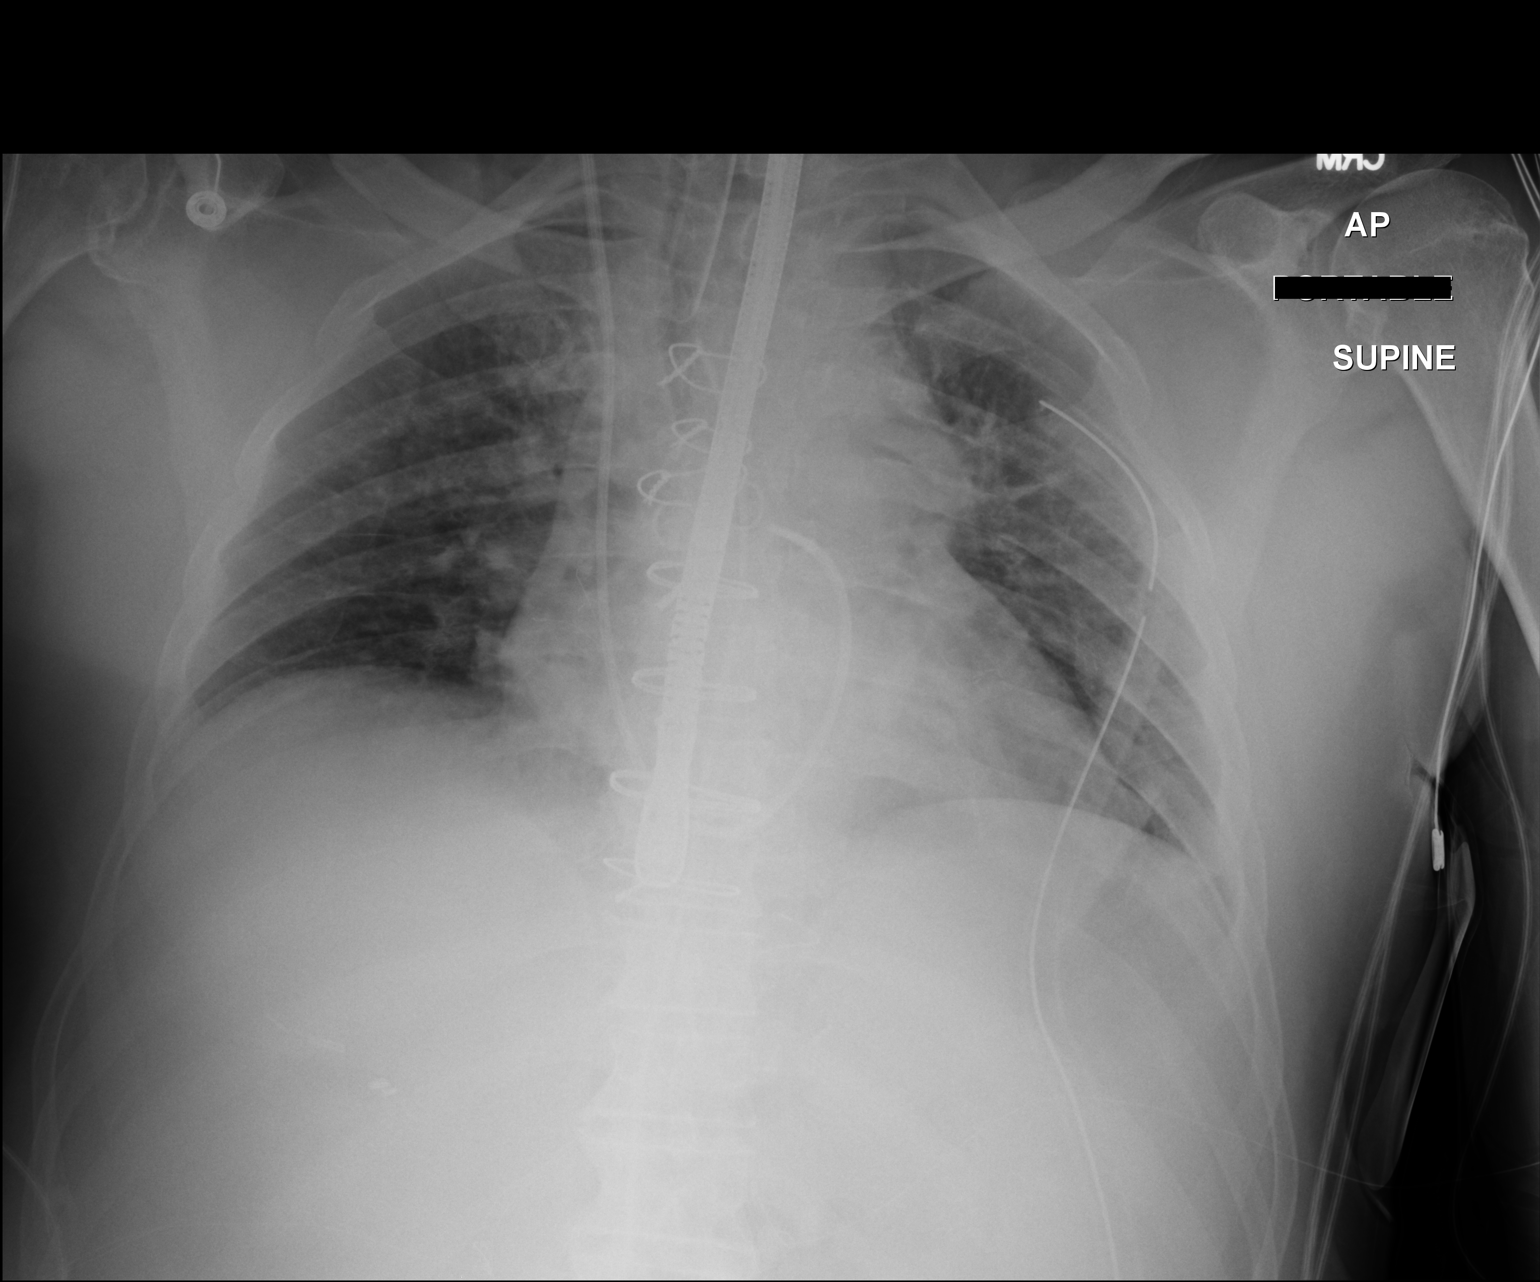

[1 of 1 positions shown; findings below may reference images not displayed]

FINDINGS: The patient is intubated. The tip of the endotracheal tube is 4.4 cm
above the carina. A transesophageal ultrasound probe is present.
Left-sided thoracostomy tube is present. Mediastinal drain is
present. Right IJ vascular sheath convey is a Swan-Ganz catheter
into the heart. The tip of the Maxim is well positioned overlying the
proximal right main pulmonary artery. Epicardial pacing leads
project over the right heart. Patient is status post median
sternotomy for multivessel CABG. Surgical clips in the right upper
quadrant suggest prior cholecystectomy. Inspiratory volumes are low.
No evidence of pneumothorax, pleural effusion or other acute
abnormality.
IMPRESSION: 1. Coronary artery bypass grafting in progress with expected support
apparatus. No unexpected retained radiopaque foreign body.
2. The tip of the endotracheal tube is 4.4 cm above the carina.
3. Right IJ vascular sheath convey is a Swan-Ganz catheter into the
heart which overlies the right main pulmonary artery in good
position.
4. Mediastinal and left-sided chest tubes.
5. Low inspiratory volumes without evidence of pneumothorax or
pleural effusion.

## 2019-02-13 IMAGING — DX DG CHEST 1V PORT
1 series · 1 of 1 positions shown · non-contrast
Comparison: July 20, 2018

CLINICAL DATA: Status post coronary artery bypass grafting, status
postextubation

EXAM:
PORTABLE CHEST 1 VIEW

[chest ap]
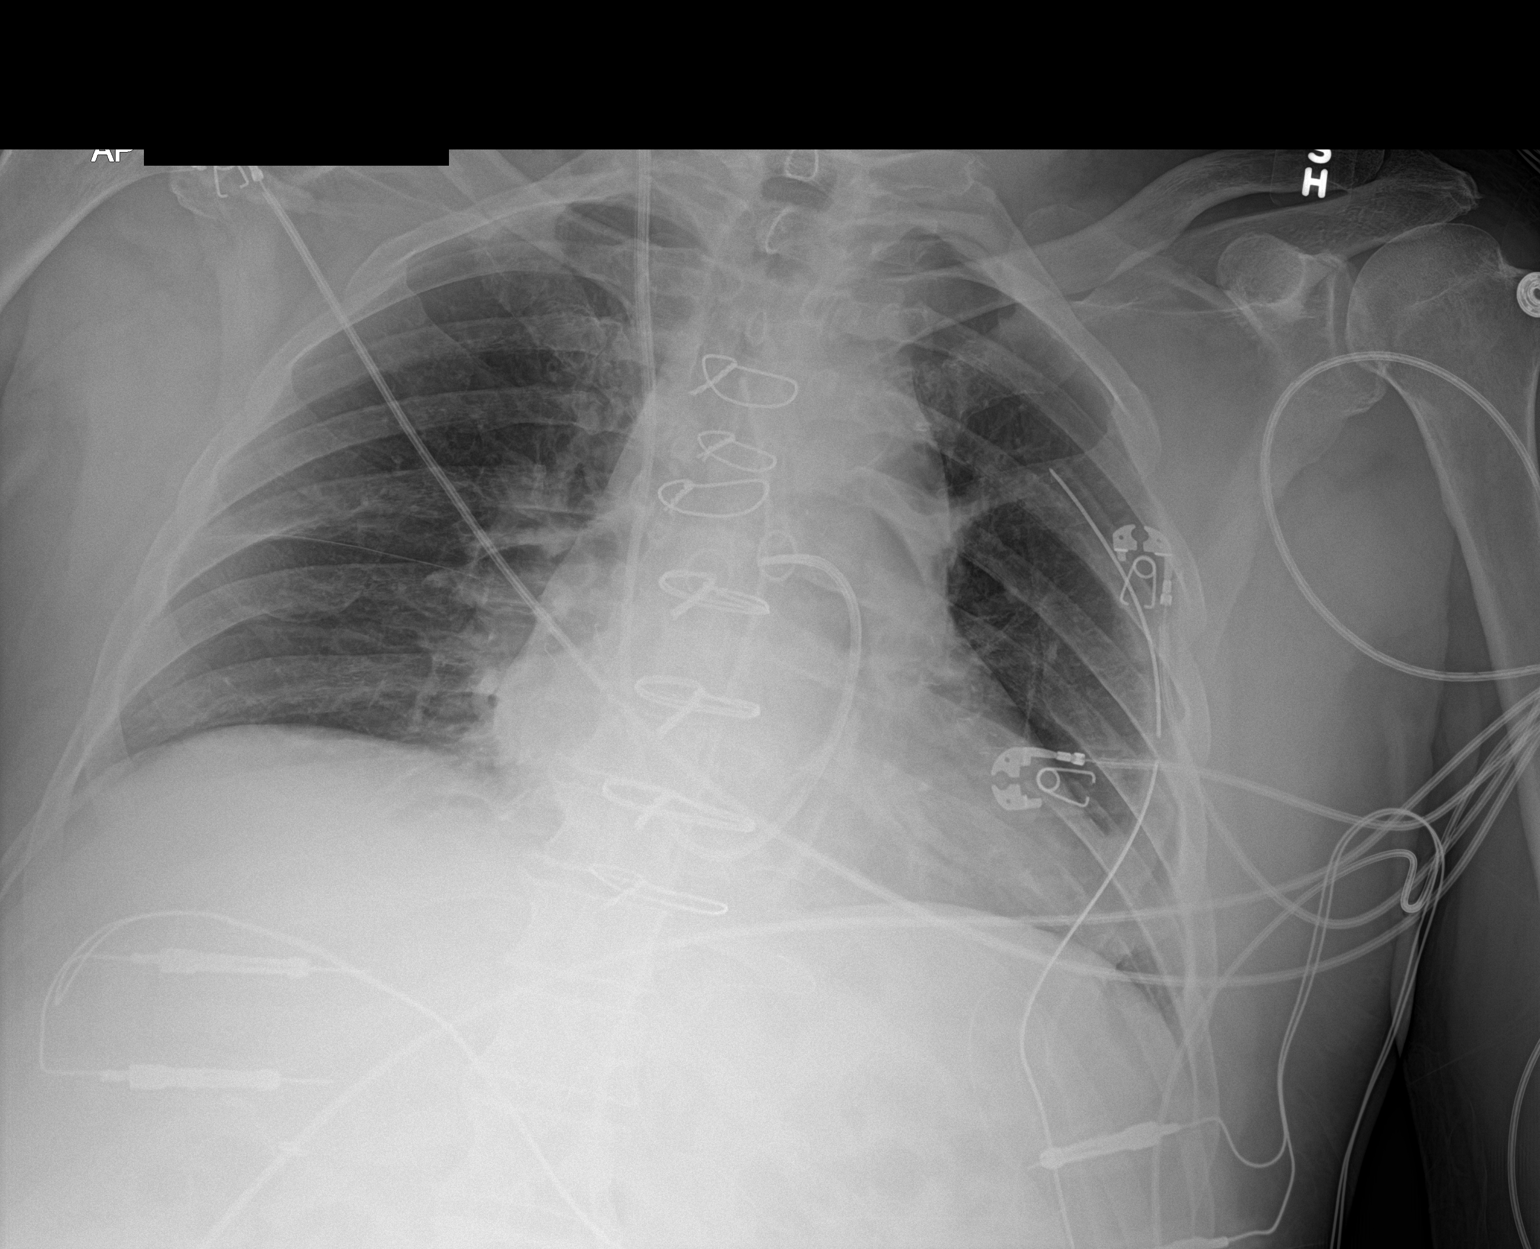

[1 of 1 positions shown; findings below may reference images not displayed]

FINDINGS: Endotracheal tube and nasogastric tube have been removed. Chest tube
remains on the left. There is a mediastinal drain, unchanged in
position. Swan-Ganz catheter tip is in the proximal right main
pulmonary artery. No evident pneumothorax. There is no edema or
consolidation. Heart size is upper normal with pulmonary vascularity
normal. There is aortic atherosclerosis. Status post coronary artery
bypass grafting. No bone lesions.
IMPRESSION: Tube and catheter positions as described without pneumothorax. No
edema or consolidation. Stable cardiac silhouette. There is aortic
atherosclerosis.

Aortic Atherosclerosis ([id number]-Z13.3).

## 2019-03-09 DIAGNOSIS — M0609 Rheumatoid arthritis without rheumatoid factor, multiple sites: Secondary | ICD-10-CM | POA: Diagnosis not present

## 2019-03-19 IMAGING — DX DG CHEST 2V
2 series · 2 of 2 positions shown · non-contrast
Comparison: Single-view of the chest 07/23/2018.

CLINICAL DATA: Patient status post CABG 07/20/2018.

EXAM:
CHEST - 2 VIEW

[dg chest 2 view (1 of 2)]
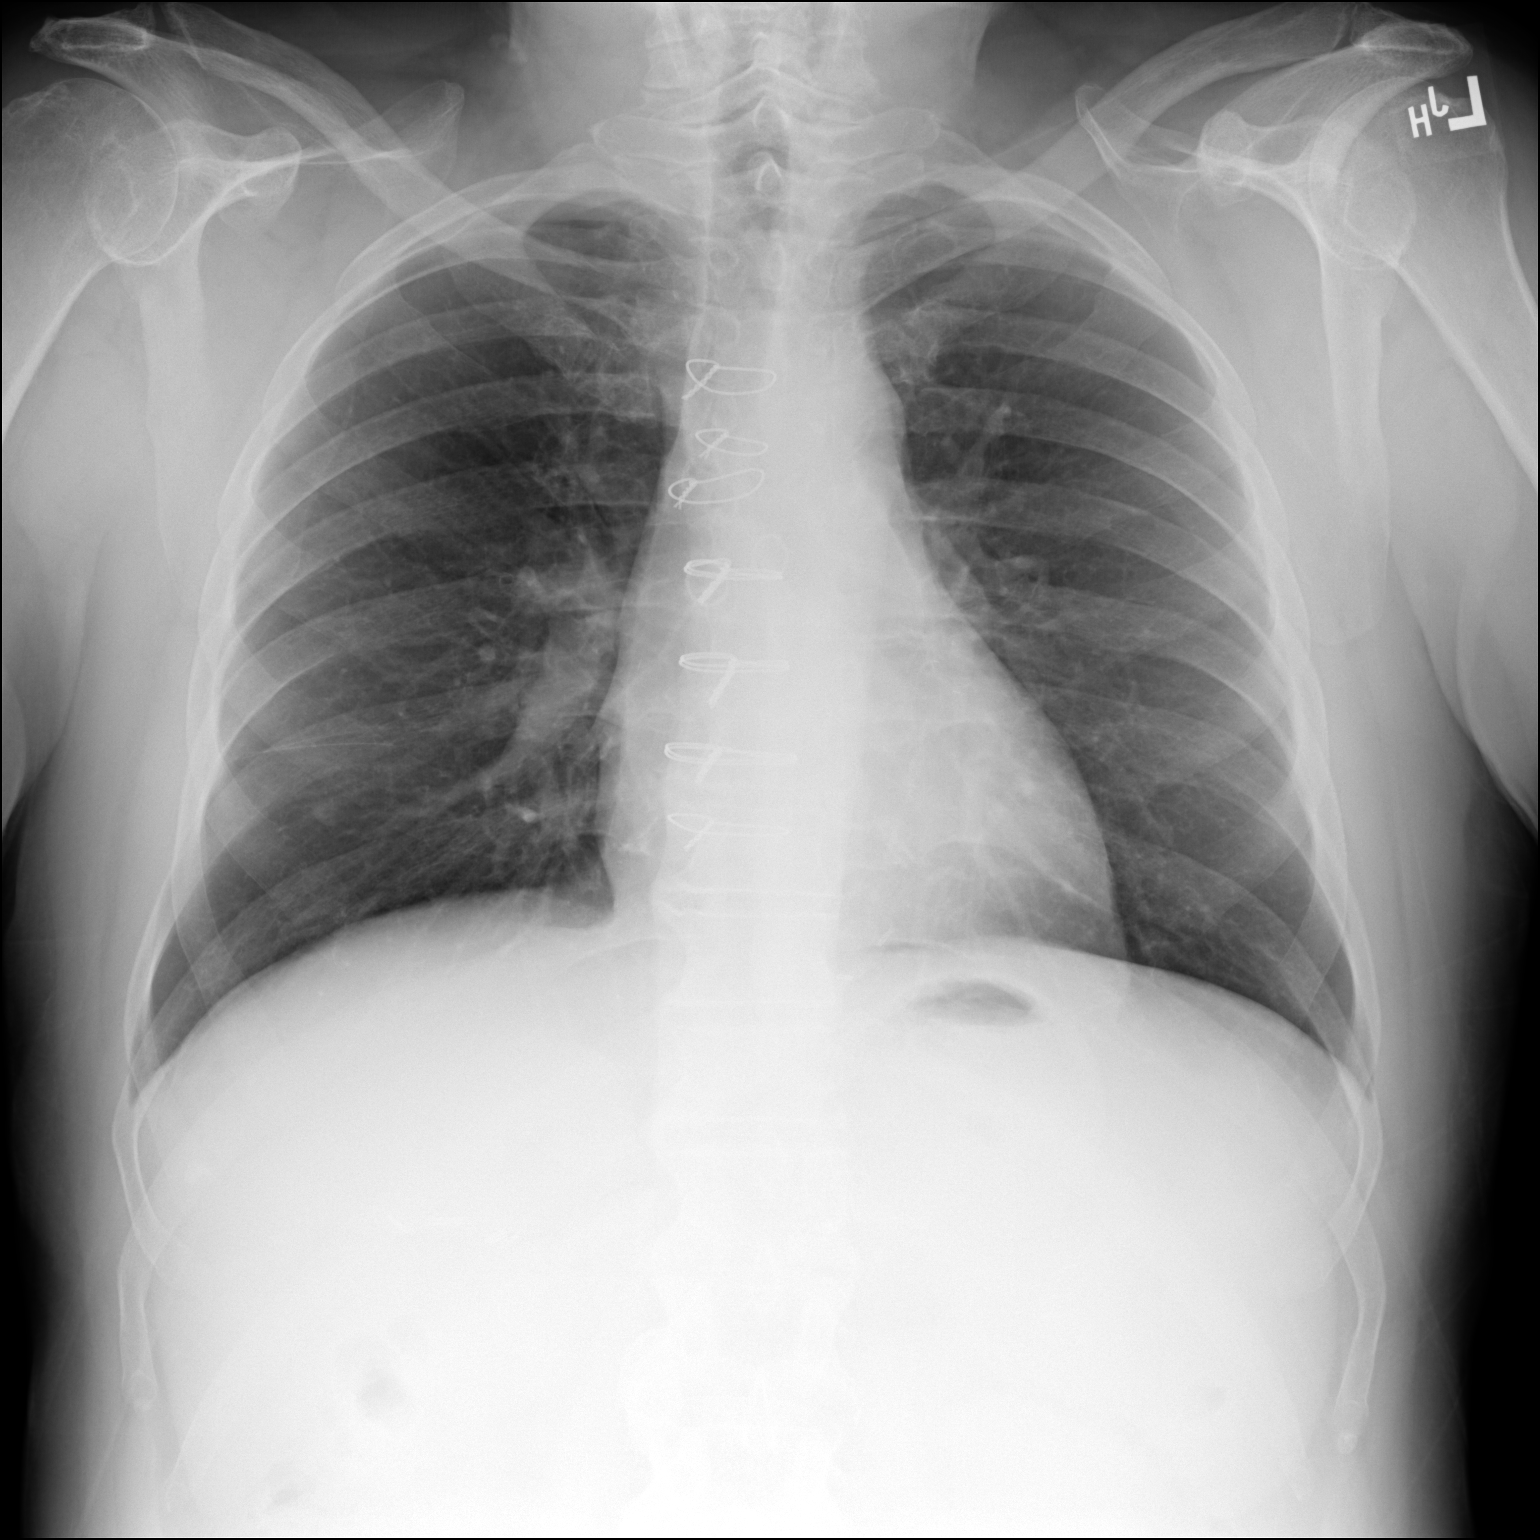

[dg chest 2 view (2 of 2)]
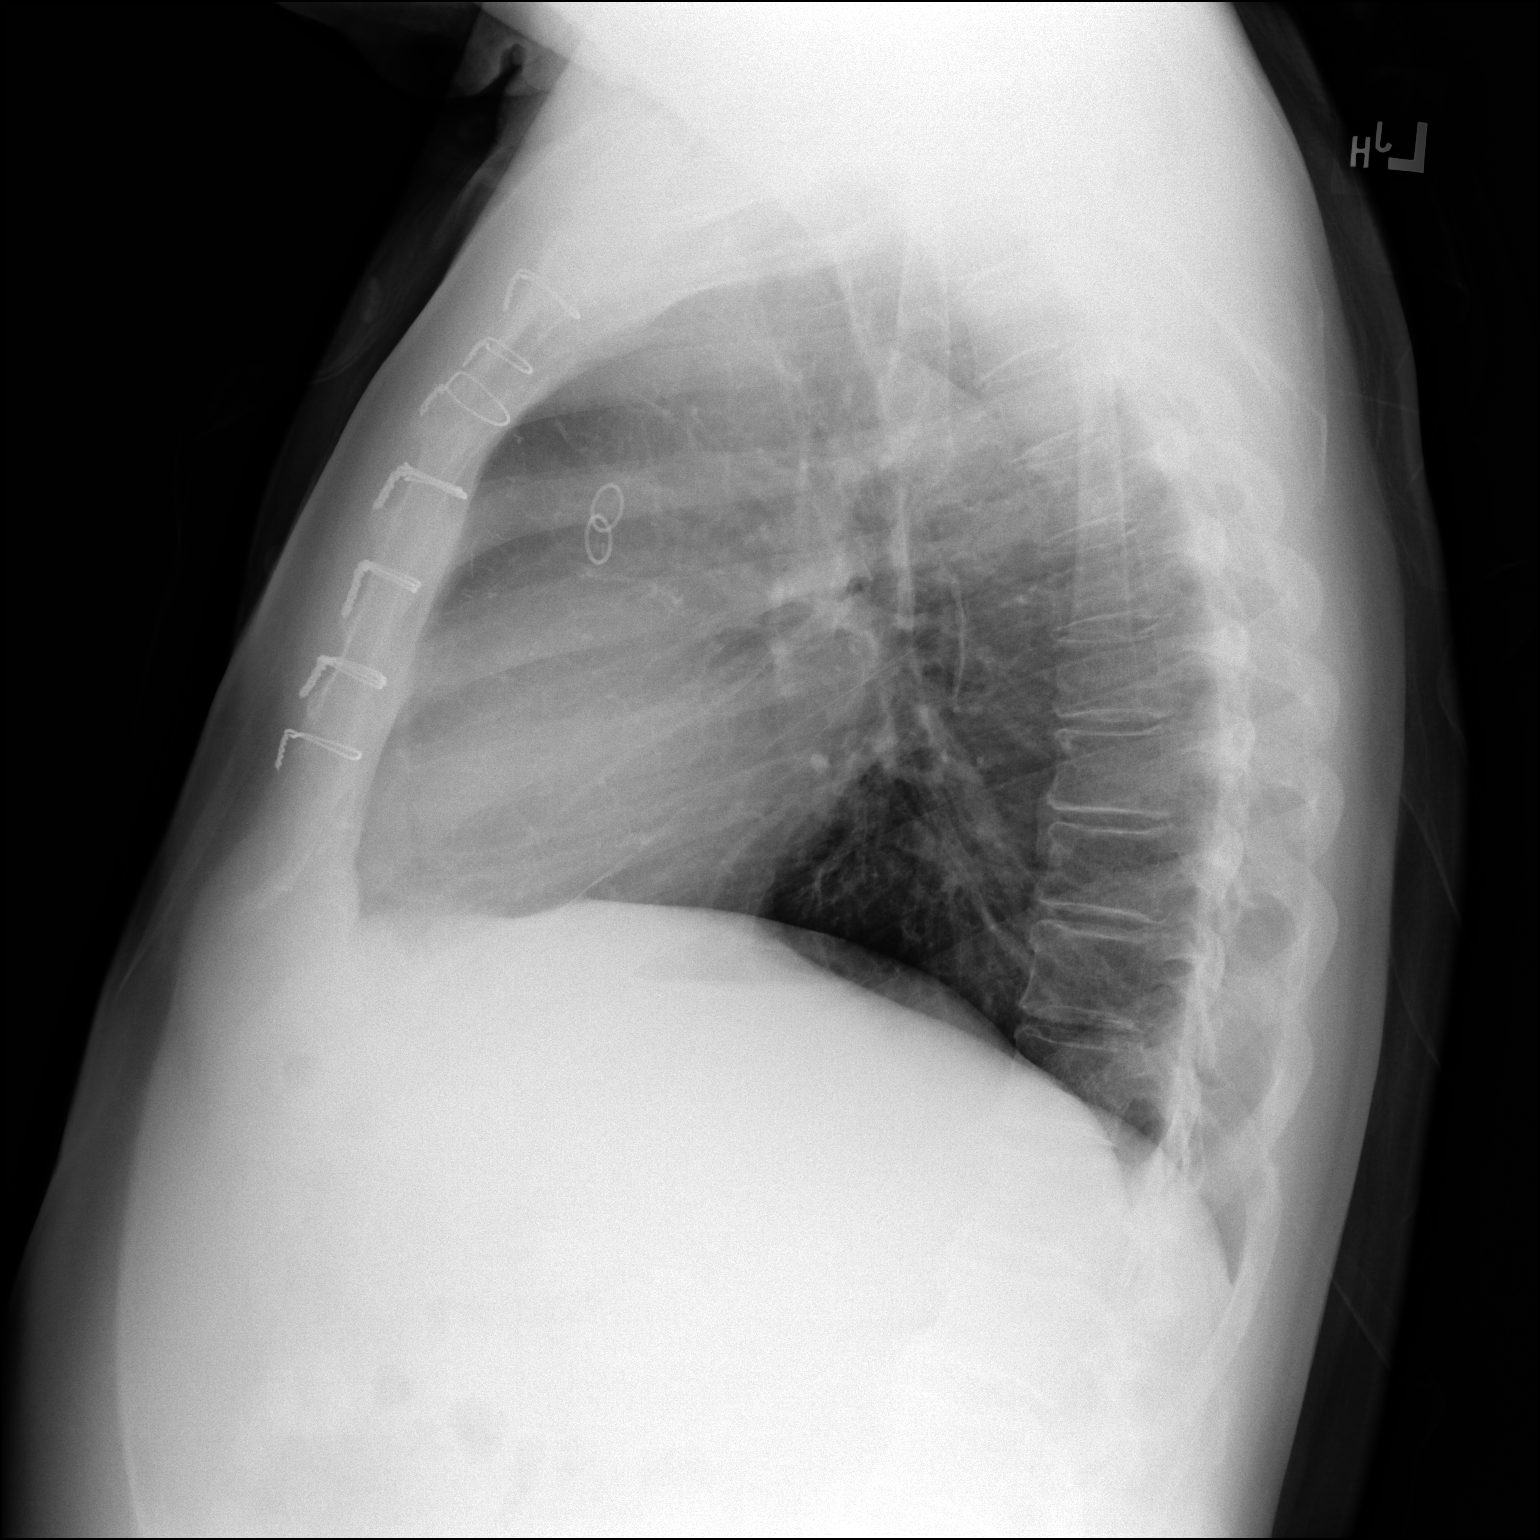

[2 of 2 positions shown; findings below may reference images not displayed]

FINDINGS: Seven intact median sternotomy wires are unchanged. The lungs are
clear. There is no pneumothorax or pleural effusion. Heart size is
normal. No acute or focal bony abnormality.
IMPRESSION: No acute disease.

## 2019-04-12 DIAGNOSIS — M1A09X0 Idiopathic chronic gout, multiple sites, without tophus (tophi): Secondary | ICD-10-CM | POA: Diagnosis not present

## 2019-04-12 DIAGNOSIS — E669 Obesity, unspecified: Secondary | ICD-10-CM | POA: Diagnosis not present

## 2019-04-12 DIAGNOSIS — Z6832 Body mass index (BMI) 32.0-32.9, adult: Secondary | ICD-10-CM | POA: Diagnosis not present

## 2019-04-12 DIAGNOSIS — M2550 Pain in unspecified joint: Secondary | ICD-10-CM | POA: Diagnosis not present

## 2019-04-12 DIAGNOSIS — M7989 Other specified soft tissue disorders: Secondary | ICD-10-CM | POA: Diagnosis not present

## 2019-04-12 DIAGNOSIS — M0609 Rheumatoid arthritis without rheumatoid factor, multiple sites: Secondary | ICD-10-CM | POA: Diagnosis not present

## 2019-04-19 ENCOUNTER — Other Ambulatory Visit: Payer: Self-pay | Admitting: Endocrinology

## 2019-04-19 ENCOUNTER — Other Ambulatory Visit: Payer: Self-pay

## 2019-04-19 ENCOUNTER — Other Ambulatory Visit (INDEPENDENT_AMBULATORY_CARE_PROVIDER_SITE_OTHER): Payer: Medicare Other

## 2019-04-19 DIAGNOSIS — Z794 Long term (current) use of insulin: Secondary | ICD-10-CM

## 2019-04-19 DIAGNOSIS — E1165 Type 2 diabetes mellitus with hyperglycemia: Secondary | ICD-10-CM

## 2019-04-19 LAB — COMPREHENSIVE METABOLIC PANEL
ALT: 30 U/L (ref 0–53)
AST: 28 U/L (ref 0–37)
Albumin: 4 g/dL (ref 3.5–5.2)
Alkaline Phosphatase: 88 U/L (ref 39–117)
BUN: 23 mg/dL (ref 6–23)
CO2: 25 mEq/L (ref 19–32)
Calcium: 9.2 mg/dL (ref 8.4–10.5)
Chloride: 107 mEq/L (ref 96–112)
Creatinine, Ser: 1.73 mg/dL — ABNORMAL HIGH (ref 0.40–1.50)
GFR: 39.16 mL/min — ABNORMAL LOW (ref >60.00)
Glucose, Bld: 195 mg/dL — ABNORMAL HIGH (ref 70–99)
Potassium: 4.2 mEq/L (ref 3.5–5.1)
Sodium: 140 mEq/L (ref 135–145)
Total Bilirubin: 0.9 mg/dL (ref 0.2–1.2)
Total Protein: 6.7 g/dL (ref 6.0–8.3)

## 2019-04-19 LAB — HEMOGLOBIN A1C: Hgb A1c MFr Bld: 7.2 % — ABNORMAL HIGH (ref 4.6–6.5)

## 2019-04-21 ENCOUNTER — Other Ambulatory Visit: Payer: Self-pay

## 2019-04-21 ENCOUNTER — Ambulatory Visit: Payer: Medicare Other | Admitting: Endocrinology

## 2019-04-22 ENCOUNTER — Telehealth: Payer: Self-pay | Admitting: Endocrinology

## 2019-04-22 NOTE — Telephone Encounter (Signed)
Called pt and he stated that he would have his wife bring his Omnipod and glucometer to the office tomorrow to be downloaded so he can then do a virtual visit with Dr. Dwyane Dee.

## 2019-04-22 NOTE — Telephone Encounter (Signed)
Patient is requesting a call back regarding his meter. Call back number is [phone]

## 2019-05-12 ENCOUNTER — Other Ambulatory Visit: Payer: Self-pay | Admitting: Cardiology

## 2019-05-22 ENCOUNTER — Ambulatory Visit
Admission: EM | Admit: 2019-05-22 | Discharge: 2019-05-22 | Disposition: A | Discharge status: Home / Self Care | Payer: Medicare Other | Attending: Physician Assistant | Admitting: Physician Assistant

## 2019-05-22 ENCOUNTER — Encounter: Payer: Self-pay | Admitting: Emergency Medicine

## 2019-05-22 ENCOUNTER — Other Ambulatory Visit: Payer: Self-pay

## 2019-05-22 DIAGNOSIS — H60392 Other infective otitis externa, left ear: Secondary | ICD-10-CM | POA: Diagnosis not present

## 2019-05-22 MED ORDER — NEOMYCIN-POLYMYXIN-HC 3.5-10000-1 OT SUSP: 4.0000 [drp] | Freq: Three times a day (TID) | OTIC | 0 refills | Status: AC

## 2019-05-22 NOTE — ED Provider Notes (Signed)
[facility]    CSN: [id number] Arrival date & time: 05/22/19  0258      History   Chief Complaint Chief Complaint  Patient presents with  . Otalgia    HPI Alec [name] is a 73 y.o. male.   73 year old male comes in for 1 week history of left ear pain.  States was worse yesterday, and decided to come in for evaluation.  Left ear has been draining clear liquid with painful palpation.  Denies changes in hearing.  Denies URI symptoms such as cough, congestion, sore throat.  Denies fever, chills, night sweats.  Denies spreading erythema, warmth.  He has recently gone to the beach, also uses cotton swabs.  States he also wears Bluetooth earpiece to the left ear.  Has not tried anything for the symptoms.     Past Medical History:  Diagnosis Date  . Anxiety   . Arthritis    "maybe in my hands" (07/15/2018)  . Bladder cancer ([place]) 2009  . CKD (chronic kidney disease), stage III ([place])   . Coronary artery disease   . Degenerative arthritis of shoulder region 08/2013   left  . Disorder of bursae and tendons in left shoulder region 08/2013  . Gout    "on daily RX" (07/15/2018)  . History of kidney stones   . Hyperlipidemia   . Hypertension    under control with meds., has been on med. > 10 yr.  . IDDM (insulin dependent diabetes mellitus) [facility])     Patient Active Problem List   Diagnosis Date Noted  . SOB (shortness of breath) 11/10/2018  . Cough 11/10/2018  . Wheezing 11/10/2018  . Coronary artery disease involving native coronary artery of native heart without angina pectoris 11/10/2018  . Dyslipidemia 11/10/2018  . CRI (chronic renal insufficiency), stage 3 (moderate) ([place]) 08/06/2018  . S/P CABG x 3 07/20/2018  . Angina, class II ([place]) 07/14/2018  . Abnormal stress electrocardiography using treadmill 07/14/2018  . Cellulitis of left arm 12/10/2017  . Cellulitis 12/09/2017  . Diabetic nephropathy associated with type 2 diabetes mellitus ([place]) 12/14/2014   . Essential hypertension 12/14/2014  . IDDM (insulin dependent diabetes mellitus) ([place]) 08/29/2012  . Dyslipidemia, goal LDL below 70 08/29/2012  . Abdominal pain 08/29/2012    Past Surgical History:  Procedure Laterality Date  . CARDIAC CATHETERIZATION  07/15/2018  . CHOLECYSTECTOMY  08/29/2012   Procedure: LAPAROSCOPIC CHOLECYSTECTOMY WITH INTRAOPERATIVE CHOLANGIOGRAM;  Surgeon: Imogene Burn. Tsuei, MD;  Location: [facility];  Service: General;  Laterality: N/A;  . CORONARY ARTERY BYPASS GRAFT N/A 07/20/2018   Procedure: CORONARY ARTERY BYPASS GRAFTING (CABG) times three using left internal mammary artery to the LAD, and using endoscopically harvested right saphenous vein to PDA and intermedius.;  Surgeon: Grace Isaac, MD;  Location: [place];  Service: Open Heart Surgery;  Laterality: N/A;  . LEFT HEART CATH AND CORONARY ANGIOGRAPHY N/A 07/15/2018   Procedure: LEFT HEART CATH AND CORONARY ANGIOGRAPHY;  Surgeon: Leonie Man, MD;  Location: [place] [facility];  Service: Cardiovascular;  Laterality: N/A;  . SHOULDER ARTHROSCOPY W/ ROTATOR CUFF REPAIR Right 2018  . SHOULDER ARTHROSCOPY WITH SUBACROMIAL DECOMPRESSION, ROTATOR CUFF REPAIR AND BICEP TENDON REPAIR Left 09/03/2013   Procedure: LEFT SHOULDER ARTHROSCOPY WITH EXTENSIVE DEBRIDMENT, DISTAL CLAVICULECTOMY, ROTATOR CUFF REPAIR AND SUBACROMIAL DECOMPRESSION PARTIAL ACRIOMIOPLASTY WITH CORACOACROMIAL RELEASE;  Surgeon: Renette Butters, MD;  Location: [place];  Service: Orthopedics;  Laterality: Left;  . TEE WITHOUT CARDIOVERSION N/A 07/20/2018  Procedure: TRANSESOPHAGEAL ECHOCARDIOGRAM (TEE);  Surgeon: Grace Isaac, MD;  Location: [place];  Service: Open Heart Surgery;  Laterality: N/A;  . TRANSURETHRAL RESECTION OF BLADDER TUMOR WITH MITOMYCIN-C  08/24/2008       Home Medications    Prior to Admission medications   Medication Sig Start Date End Date Taking? Authorizing Provider  allopurinol (ZYLOPRIM) 100  MG tablet Take 200 mg by mouth daily after supper.    [provider]  amLODipine (NORVASC) 5 MG tablet TAKE 1 TABLET BY MOUTH EVERY DAY 05/12/19   Erlene Quan, PA-C  aspirin 325 MG EC tablet Take 1 tablet (325 mg total) by mouth daily. 07/25/18   Elgie Collard, PA-C  atorvastatin (LIPITOR) 40 MG tablet Take 1 tablet (40 mg total) by mouth daily at 6 PM. 09/25/18   Minus Breeding, MD  buPROPion (WELLBUTRIN XL) 150 MG 24 hr tablet Take 150 mg by mouth daily.    [provider]  Cholecalciferol (VITAMIN D) 2000 units tablet Take 2,000 Units by mouth daily after supper.    [provider]  Glucosamine-Chondroitin (OSTEO BI-FLEX REGULAR STRENGTH PO) Take 1 tablet by mouth daily after supper.    [provider]  glucose blood (FREESTYLE LITE) test strip USE AS INSTRUCTED TO TEST BLOOD SUGARS 3 TIMES DAILY. DX CODE-E11.65 12/24/18   Elayne Snare, MD  insulin aspart (NOVOLOG) 100 UNIT/ML injection INJECT 85 UNITS SUBCUTANEOUS ONCE DAILY WITH INSULIN PUMP 02/01/19   Elayne Snare, MD  Insulin Disposable Pump (OMNIPOD 5 PACK) MISC Place 1 each onto the skin as directed. Place 1 onto skin as directed every 48 hours 12/09/18   Elayne Snare, MD  metoprolol tartrate (LOPRESSOR) 25 MG tablet Take 1 tablet (25 mg total) by mouth 2 (two) times daily. 09/25/18   Minus Breeding, MD  Multiple Vitamins-Minerals (ADVANCED DIABETIC MULTIVITAMIN) TABS Take 2 tablets by mouth daily after supper.    [provider]  neomycin-polymyxin-hydrocortisone (CORTISPORIN) 3.5-10000-1 OTIC suspension Place 4 drops into the left ear 3 (three) times daily for 7 days. 05/22/19 05/29/19  Ok Edwards, PA-C  nitroGLYCERIN (NITROSTAT) 0.4 MG SL tablet Place 1 tablet (0.4 mg total) under the tongue every 5 (five) minutes as needed for chest pain. 08/06/18 11/04/18  Erlene Quan, PA-C  rOPINIRole (REQUIP) 0.5 MG tablet Take 1 tablet (0.5 mg total) by mouth at bedtime. 10/20/18   Elayne Snare, MD  VICTOZA 18  MG/3ML SOPN INJECT 1.8 MG SUBCUTANEOUSLY ONCE DAILY AT THE SAME TIME EVERYDAY 01/11/19   Elayne Snare, MD    Family History Family History  Problem Relation Age of Onset  . Diabetes Mother   . Cancer Father   . Coronary artery disease Father        MI at age 69.   . Sudden death Son   . CAD Maternal Grandfather        MI in his 51's  . CAD Paternal Grandfather     Social History Social History   Tobacco Use  . Smoking status: Former Smoker    Packs/day: 2.00    Years: 25.00    Pack years: 50.00    Types: Cigarettes    Quit date: 09/11/1997    Years since quitting: 21.7  . Smokeless tobacco: Never Used  Substance Use Topics  . Alcohol use: Not Currently    Comment: 07/17/2018 "nothing since the mid 1990s"  . Drug use: Never     Allergies   Patient has no known allergies.  Review of Systems Review of Systems  Reason unable to perform ROS: See HPI as above.     Physical Exam Triage Vital Signs ED Triage Vitals [05/22/19 0928]  Enc Vitals Group     BP (!) 187/70     Pulse Rate (!) 59     Resp 18     Temp 98 F (36.7 C)     Temp Source Oral     SpO2 96 %     Weight      Height      Head Circumference      Peak Flow      Pain Score 7     Pain Loc      Pain Edu?      Excl. in [place]?    No data found.  Updated Vital Signs BP (!) 187/70 (BP Location: Left Arm)   Pulse (!) 59   Temp 98 F (36.7 C) (Oral)   Resp 18   SpO2 96%   Physical Exam Constitutional:      General: He is not in acute distress.    Appearance: He is well-developed. He is not diaphoretic.  HENT:     Head: Normocephalic and atraumatic.     Right Ear: Tympanic membrane, ear canal and external ear normal. Tympanic membrane is not erythematous or bulging.     Left Ear: Tympanic membrane normal. Tympanic membrane is not erythematous or bulging.     Ears:     Comments: Left ear tragus tender to palpation.  Diffuse swelling with erythema to the ear canal.  No pores visualized.  No  tenderness to palpation to the mastoid.    Mouth/Throat:     Mouth: Mucous membranes are moist.     Pharynx: Oropharynx is clear.  Eyes:     Conjunctiva/sclera: Conjunctivae normal.     Pupils: Pupils are equal, round, and reactive to light.  Neurological:     Mental Status: He is alert and oriented to person, place, and time.     UC Treatments / Results  Labs (all labs ordered are listed, but only abnormal results are displayed) Labs Reviewed - No data to display  EKG   Radiology No results found.  Procedures Procedures (including critical care time)  Medications Ordered in UC Medications - No data to display  Initial Impression / Assessment and Plan / UC Course  I have reviewed the triage vital signs and the nursing notes.  Pertinent labs & imaging results that were available during my care of the patient were reviewed by me and considered in my medical decision making (see chart for details).    History and exam consistent with otitis externa, will start Cortisporin as directed.  Other symptomatic treatment discussed.  Return precautions given.  Patient expresses understanding and agrees to plan.  Final Clinical Impressions(s) / UC Diagnoses   Final diagnoses:  Infective otitis externa of left ear   ED Prescriptions    Medication Sig Dispense Auth. Provider   neomycin-polymyxin-hydrocortisone (CORTISPORIN) 3.5-10000-1 OTIC suspension Place 4 drops into the left ear 3 (three) times daily for 7 days. 4.2 mL Tobin Chad, [place] 05/22/19 [phone]

## 2019-05-22 NOTE — ED Triage Notes (Signed)
Pt here for left ear pain x 1 week

## 2019-05-22 NOTE — Discharge Instructions (Addendum)
Start cortisporin as directed. Avoid cotton swab/swimming until symptoms resolve. Follow up for reevaluation if symptoms not improving.

## 2019-05-22 NOTE — Progress Notes (Signed)
Cardiology Office Note   Date:  05/24/2019   ID:  KREED KAUFFMAN, DOB 04/23/1946, MRN [id number]  PCP:  Mayra Neer, MD  Cardiologist: Dr. Percival Spanish No chief complaint on file.    History of Present Illness: Alec [name] is a 73 y.o. male who presents for ongoing assessment and management of coronary artery disease, with history of coronary artery bypass grafting, 07/20/2018, (LIMA to LAD, SVG to PDA, and SVG to intermedius), hypertension, hyperlipidemia, with other history to include diabetes and chronic kidney disease stage III.  He was last seen in the office by Dr. Percival Spanish on 11/10/2018 and was stable from a cardiovascular standpoint, aspirin was reduced to 81 mg daily.  No further testing or labs were planned at that time.  He is here today without any cardiac complaints.  He does have some soreness in his chest related to his surgery last year.  It does not keep him from being active.  He is about to retire in 3 weeks and is looking forward to it.  The patient has been medically compliant.  Labs are completed by his PCP.  He states that his blood pressure has been elevated at home despite use of amlodipine.  Past Medical History:  Diagnosis Date  . Anxiety   . Arthritis    "maybe in my hands" (07/15/2018)  . Bladder cancer ([place]) 2009  . CKD (chronic kidney disease), stage III ([place])   . Coronary artery disease   . Degenerative arthritis of shoulder region 08/2013   left  . Disorder of bursae and tendons in left shoulder region 08/2013  . Gout    "on daily RX" (07/15/2018)  . History of kidney stones   . Hyperlipidemia   . Hypertension    under control with meds., has been on med. > 10 yr.  . IDDM (insulin dependent diabetes mellitus) ([place])     Past Surgical History:  Procedure Laterality Date  . CARDIAC CATHETERIZATION  07/15/2018  . CHOLECYSTECTOMY  08/29/2012   Procedure: LAPAROSCOPIC CHOLECYSTECTOMY WITH INTRAOPERATIVE CHOLANGIOGRAM;  Surgeon: Imogene Burn. Tsuei,  MD;  Location: [facility];  Service: General;  Laterality: N/A;  . CORONARY ARTERY BYPASS GRAFT N/A 07/20/2018   Procedure: CORONARY ARTERY BYPASS GRAFTING (CABG) times three using left internal mammary artery to the LAD, and using endoscopically harvested right saphenous vein to PDA and intermedius.;  Surgeon: Grace Isaac, MD;  Location: [place];  Service: Open Heart Surgery;  Laterality: N/A;  . LEFT HEART CATH AND CORONARY ANGIOGRAPHY N/A 07/15/2018   Procedure: LEFT HEART CATH AND CORONARY ANGIOGRAPHY;  Surgeon: Leonie Man, MD;  Location: [place] [facility];  Service: Cardiovascular;  Laterality: N/A;  . SHOULDER ARTHROSCOPY W/ ROTATOR CUFF REPAIR Right 2018  . SHOULDER ARTHROSCOPY WITH SUBACROMIAL DECOMPRESSION, ROTATOR CUFF REPAIR AND BICEP TENDON REPAIR Left 09/03/2013   Procedure: LEFT SHOULDER ARTHROSCOPY WITH EXTENSIVE DEBRIDMENT, DISTAL CLAVICULECTOMY, ROTATOR CUFF REPAIR AND SUBACROMIAL DECOMPRESSION PARTIAL ACRIOMIOPLASTY WITH CORACOACROMIAL RELEASE;  Surgeon: Renette Butters, MD;  Location: [place];  Service: Orthopedics;  Laterality: Left;  . TEE WITHOUT CARDIOVERSION N/A 07/20/2018   Procedure: TRANSESOPHAGEAL ECHOCARDIOGRAM (TEE);  Surgeon: Grace Isaac, MD;  Location: [place];  Service: Open Heart Surgery;  Laterality: N/A;  . TRANSURETHRAL RESECTION OF BLADDER TUMOR WITH MITOMYCIN-C  08/24/2008     Current Outpatient Medications  Medication Sig Dispense Refill  . allopurinol (ZYLOPRIM) 100 MG tablet Take 200 mg by mouth daily after supper.    Marland Kitchen amLODipine (  NORVASC) 10 MG tablet Take 1 tablet (10 mg total) by mouth daily. 90 tablet 3  . atorvastatin (LIPITOR) 40 MG tablet Take 1 tablet (40 mg total) by mouth daily at 6 PM. 90 tablet 3  . buPROPion (WELLBUTRIN XL) 150 MG 24 hr tablet Take 150 mg by mouth daily.    . Cholecalciferol (VITAMIN D) 2000 units tablet Take 2,000 Units by mouth daily after supper.    . folic acid (FOLVITE) 1 MG tablet Take 1  mg by mouth daily.    . Glucosamine-Chondroitin (OSTEO BI-FLEX REGULAR STRENGTH PO) Take 1 tablet by mouth daily after supper.    . insulin aspart (NOVOLOG) 100 UNIT/ML injection INJECT 85 UNITS SUBCUTANEOUS ONCE DAILY WITH INSULIN PUMP 70 mL 3  . Insulin Disposable Pump (OMNIPOD 5 PACK) MISC Place 1 each onto the skin as directed. Place 1 onto skin as directed every 48 hours 15 each 11  . methotrexate (RHEUMATREX) 2.5 MG tablet TAKE 7 TABLETS ONCE A WEEK    . metoprolol tartrate (LOPRESSOR) 25 MG tablet Take 1 tablet (25 mg total) by mouth 2 (two) times daily. 180 tablet 3  . Multiple Vitamins-Minerals (ADVANCED DIABETIC MULTIVITAMIN) TABS Take 2 tablets by mouth daily after supper.    . neomycin-polymyxin-hydrocortisone (CORTISPORIN) 3.5-10000-1 OTIC suspension Place 4 drops into the left ear 3 (three) times daily for 7 days. 4.2 mL 0  . nitroGLYCERIN (NITROSTAT) 0.4 MG SL tablet Place 1 tablet (0.4 mg total) under the tongue every 5 (five) minutes as needed for chest pain. 30 tablet 1  . sertraline (ZOLOFT) 50 MG tablet Take 50 mg by mouth daily.    . traZODone (DESYREL) 50 MG tablet TAKE 1 TABLET BY MOUTH EVERY DAY AT BEDTIME AS NEEDED     No current facility-administered medications for this visit.     Allergies:   Patient has no known allergies.    Social History:  The patient  reports that he quit smoking about 21 years ago. His smoking use included cigarettes. He has a 50.00 pack-year smoking history. He has never used smokeless tobacco. He reports previous alcohol use. He reports that he does not use drugs.   Family History:  The patient's family history includes CAD in his maternal grandfather and paternal grandfather; Cancer in his father; Coronary artery disease in his father; Diabetes in his mother; Sudden death in his son.    ROS: All other systems are reviewed and negative. Unless otherwise mentioned in H&P    PHYSICAL EXAM: VS:  BP (!) 162/64   Pulse (!) 50   Ht 5\' 6"   (1.676 m)   Wt 201 lb (91.2 kg)   BMI 32.44 kg/m  , BMI Body mass index is 32.44 kg/m. GEN: Well nourished, well developed, in no acute distress HEENT: normal Neck: no JVD, carotid bruits, or masses Cardiac: RRR; bradycardic, no murmurs, rubs, or gallops,no edema, distal pulses are weak on the right dorsalis pedis.  Palpable on the left. Respiratory:  Clear to auscultation bilaterally, normal work of breathing GI: soft, nontender, nondistended, + BS MS: no deformity or atrophy Skin: warm and dry, no rash Neuro:  Strength and sensation are intact Psych: euthymic mood, full affect   EKG: Sinus bradycardia with first-degree AV block, PR interval 224 ms, heart rate of 50 bpm.  Recent Labs: 07/14/2018: TSH 2.520 07/21/2018: Magnesium 2.3 07/23/2018: Hemoglobin 10.3; Platelets 125 04/19/2019: ALT 30; BUN 23; Creatinine, Ser 1.73; Potassium 4.2; Sodium 140    Lipid Panel  Component Value Date/Time   CHOL 94 07/16/2018 0731   TRIG 120 07/16/2018 0731   HDL 25 (L) 07/16/2018 0731   CHOLHDL 3.8 07/16/2018 0731   VLDL 24 07/16/2018 0731   LDLCALC 45 07/16/2018 0731      Wt Readings from Last 3 Encounters:  05/24/19 201 lb (91.2 kg)  11/10/18 197 lb 12.8 oz (89.7 kg)  10/22/18 199 lb 9.6 oz (90.5 kg)      Other studies Reviewed: Cardiac Cath 07/15/2018.   LV Findings:  The left ventricular systolic function is low-normal. The left ventricular ejection fraction is 50-55% by visual estimate.  LV end diastolic pressure is moderately elevated.  Angiographic Findings:  Mid RCA-1 lesion is 40% stenosed. Mid RCA-2 lesion is 95% stenosed. Dist RCA lesion is 95% stenosed just at the takeoff of the RPAV.  Mid LM to Prox LAD lesion is 50% stenosed. -Extensive long vessel that is calcified. Very small circumflex and ramus.  Prox LAD lesion is 90% stenosed at takeoff of 1st Diag. Prox LAD to Mid LAD lesion is 80% stenosed.   Severe multivessel disease with distal left main  calcified 40 to 50% stenosis, proximal LAD tandem 90 and 80% lesions covering along area and including major first diagonal branch.  This is in conjunction with 2 significant mid and distal RCA lesions of 95% and then a 90% bifurcation lesion just prior to the takeoff of the PAB. Likely low normal EF but difficult to assess wall motion because of ectopy  Echocardiogram 07/17/2019 Left ventricle: The cavity size was normal. Wall thickness was   normal. Systolic function was normal. The estimated ejection   fraction was in the range of 55% to 60%. Basal to mid inferior   hypokinesis. Left ventricular diastolic function parameters were   normal. - Aortic valve: There was no stenosis. There was trivial   regurgitation. - Mitral valve: Mildly calcified annulus. There was trivial   regurgitation. - Left atrium: The atrium was mildly dilated. - Right ventricle: The cavity size was normal. Systolic function   was normal. - Pulmonary arteries: No complete TR doppler jet so unable to   estimate PA systolic pressure. - Inferior vena cava: The vessel was normal in size. The   respirophasic diameter changes were in the normal range (>= 50%),   consistent with normal central venous pressure.   ASSESSMENT AND PLAN  1.  Coronary artery disease: History of CABG in October 2019 with LIMA to LAD, SVG to PDA and SVG to intermedius.  He is without complaints of discomfort.  He is bradycardic today per EKG.  But unchanged since 08/06/2018 and asymptomatic.  Continue metoprolol, reduce ASA to 81 mg daily.  Have advised him also to walk 30 minutes a day to increase his stamina and promote cardiovascular health.  2.  Hypertension: Currently not well controlled in the office today.  I have rechecked it and found it to be 167/72.  Will increase amlodipine to 10 mg daily, will likely need to use a second agent.  In the setting of chronic renal insufficiency would recommend hydralazine beginning at 25 mg twice daily  to 3 times daily with goal of blood pressure 120/60 in this patient with diabetes and coronary artery disease.  3.  Hypercholesterolemia: Currently on atorvastatin 40 mg daily with goal of LDL to be less than 70.  Most recent LDL calculated in October 2019 was 45 indicating good control.  4.  Insulin-dependent diabetes: Followed by endocrinologist.  5.  Chronic  renal insufficiency: Followed by nephrology   Current medicines are reviewed at length with the patient today.    Labs/ tests ordered today include: None  Alec [name], ANP, AACC   05/24/2019 8:40 AM    [address] Office [phone] Fax [phone]

## 2019-05-24 ENCOUNTER — Other Ambulatory Visit: Payer: Self-pay

## 2019-05-24 ENCOUNTER — Ambulatory Visit (INDEPENDENT_AMBULATORY_CARE_PROVIDER_SITE_OTHER): Payer: Medicare Other | Admitting: Adult Health

## 2019-05-24 ENCOUNTER — Encounter: Payer: Self-pay | Admitting: Adult Health

## 2019-05-24 VITALS — BP 162/64 | HR 50 | Ht 66.0 in | Wt 201.0 lb

## 2019-05-24 DIAGNOSIS — I2510 Atherosclerotic heart disease of native coronary artery without angina pectoris: Secondary | ICD-10-CM

## 2019-05-24 DIAGNOSIS — Z794 Long term (current) use of insulin: Secondary | ICD-10-CM | POA: Diagnosis not present

## 2019-05-24 DIAGNOSIS — E119 Type 2 diabetes mellitus without complications: Secondary | ICD-10-CM

## 2019-05-24 DIAGNOSIS — E7800 Pure hypercholesterolemia, unspecified: Secondary | ICD-10-CM

## 2019-05-24 DIAGNOSIS — I10 Essential (primary) hypertension: Secondary | ICD-10-CM | POA: Diagnosis not present

## 2019-05-24 DIAGNOSIS — IMO0001 Reserved for inherently not codable concepts without codable children: Secondary | ICD-10-CM

## 2019-05-24 MED ORDER — AMLODIPINE BESYLATE 10 MG PO TABS: 10.0000 mg | ORAL_TABLET | Freq: Every day | ORAL | 3 refills | Status: DC

## 2019-05-24 MED ORDER — METOPROLOL TARTRATE 25 MG PO TABS: 25.0000 mg | ORAL_TABLET | Freq: Two times a day (BID) | ORAL | 3 refills | Status: DC

## 2019-05-24 MED ORDER — ATORVASTATIN CALCIUM 40 MG PO TABS: 40.0000 mg | ORAL_TABLET | Freq: Every day | ORAL | 3 refills | Status: DC

## 2019-05-24 NOTE — Patient Instructions (Signed)
Medication Instructions:   INCREASE- Amlodipine 10 mg by mouth daily  If you need a refill on your cardiac medications before your next appointment, please call your pharmacy.  Labwork: None Ordered   Testing/Procedures: None Ordered   Follow-Up: You will need a follow up appointment in 6 months.  Please call our office 2 months in advance to schedule this appointment.  You may see Minus Breeding, MD or one of the following Advanced Practice Providers on your designated Care Team:   Rosaria Ferries, PA-C . Jory Sims, DNP, ANP     At [facility], you and your health needs are our priority.  As part of our continuing mission to provide you with exceptional heart care, we have created designated Provider Care Teams.  These Care Teams include your primary Cardiologist (physician) and Advanced Practice Providers (APPs -  Physician Assistants and Nurse Practitioners) who all work together to provide you with the care you need, when you need it.  Thank you for choosing [facility] at [facility]!!

## 2019-06-07 ENCOUNTER — Telehealth: Payer: Self-pay | Admitting: Student

## 2019-06-07 DIAGNOSIS — I10 Essential (primary) hypertension: Secondary | ICD-10-CM

## 2019-06-07 MED ORDER — HYDRALAZINE HCL 25 MG PO TABS: 25.0000 mg | ORAL_TABLET | Freq: Two times a day (BID) | ORAL | 2 refills | Status: DC

## 2019-06-07 NOTE — Telephone Encounter (Signed)
   Patient and wife called Answering Service with concerns for elevated BP. Patient seen by Jory Sims, NP, on 05/24/2019 for routine follow-up. BP noted to be elevated at that visit at 167/72. Amlodipine was increased to 10mg  daily. Wife reports that BP has continued to be elevated despite this increase. BP this afternoon 172/69. BP as [place] 192[place]83 [place]. Patient reports dull headache and lightheadedness at times and states he is just very fatigued and has decreased energy level. No chest pain, palpitations, syncope, or stroke-like symptoms. Per Kathryn's office visit note, she suspected that we would have to add an addition antihypertensive and recommended starting with Hydralazine 25mg  twice daily. Patient bradycardic in office so cannot increase beta-blocker and has CKD so ACEi/ARB not ideal. Sent in prescription for Hydralazine 25mg  twice daily as Curt Bears recommended. Advised patient to keep daily log of BP's to bring to next appointment but to let us know before then if systolic BP is sustaining in the 150's or above after being on Hydralazine for a few days. Discussed signs/symptoms of stroke and explained that if patient develops any of these symptoms he needs to call 911 right away and be evaluated in the ED. Patient voiced understanding and agreed.  Darreld Mclean, PA-C 06/07/2019 5:44 PM

## 2019-06-09 DIAGNOSIS — N2581 Secondary hyperparathyroidism of renal origin: Secondary | ICD-10-CM | POA: Diagnosis not present

## 2019-06-09 DIAGNOSIS — Z0000 Encounter for general adult medical examination without abnormal findings: Secondary | ICD-10-CM | POA: Diagnosis not present

## 2019-06-09 DIAGNOSIS — E1122 Type 2 diabetes mellitus with diabetic chronic kidney disease: Secondary | ICD-10-CM | POA: Diagnosis not present

## 2019-06-09 DIAGNOSIS — N183 Chronic kidney disease, stage 3 (moderate): Secondary | ICD-10-CM | POA: Diagnosis not present

## 2019-06-09 DIAGNOSIS — M109 Gout, unspecified: Secondary | ICD-10-CM | POA: Diagnosis not present

## 2019-06-09 DIAGNOSIS — M069 Rheumatoid arthritis, unspecified: Secondary | ICD-10-CM | POA: Diagnosis not present

## 2019-06-09 DIAGNOSIS — E782 Mixed hyperlipidemia: Secondary | ICD-10-CM | POA: Diagnosis not present

## 2019-06-09 DIAGNOSIS — C61 Malignant neoplasm of prostate: Secondary | ICD-10-CM | POA: Diagnosis not present

## 2019-06-09 DIAGNOSIS — I129 Hypertensive chronic kidney disease with stage 1 through stage 4 chronic kidney disease, or unspecified chronic kidney disease: Secondary | ICD-10-CM | POA: Diagnosis not present

## 2019-06-09 DIAGNOSIS — I2510 Atherosclerotic heart disease of native coronary artery without angina pectoris: Secondary | ICD-10-CM | POA: Diagnosis not present

## 2019-06-09 DIAGNOSIS — R5383 Other fatigue: Secondary | ICD-10-CM | POA: Diagnosis not present

## 2019-06-09 DIAGNOSIS — F411 Generalized anxiety disorder: Secondary | ICD-10-CM | POA: Diagnosis not present

## 2019-06-16 ENCOUNTER — Encounter: Payer: Self-pay | Admitting: *Deleted

## 2019-06-16 DIAGNOSIS — N183 Chronic kidney disease, stage 3 (moderate): Secondary | ICD-10-CM | POA: Diagnosis not present

## 2019-06-16 DIAGNOSIS — I10 Essential (primary) hypertension: Secondary | ICD-10-CM

## 2019-06-16 DIAGNOSIS — R5383 Other fatigue: Secondary | ICD-10-CM | POA: Diagnosis not present

## 2019-06-16 DIAGNOSIS — E782 Mixed hyperlipidemia: Secondary | ICD-10-CM | POA: Diagnosis not present

## 2019-06-16 DIAGNOSIS — Z794 Long term (current) use of insulin: Secondary | ICD-10-CM | POA: Diagnosis not present

## 2019-06-16 DIAGNOSIS — I129 Hypertensive chronic kidney disease with stage 1 through stage 4 chronic kidney disease, or unspecified chronic kidney disease: Secondary | ICD-10-CM | POA: Diagnosis not present

## 2019-06-16 DIAGNOSIS — C61 Malignant neoplasm of prostate: Secondary | ICD-10-CM | POA: Diagnosis not present

## 2019-06-16 DIAGNOSIS — E1122 Type 2 diabetes mellitus with diabetic chronic kidney disease: Secondary | ICD-10-CM | POA: Diagnosis not present

## 2019-06-16 LAB — LIPID PANEL
Cholesterol: 104 (ref 0–200)
HDL: 70 (ref 35–70)
LDL Cholesterol: 49
Triglycerides: 106 (ref 40–160)

## 2019-06-16 LAB — BASIC METABOLIC PANEL: Creatinine: 1.4 — AB (ref <=1.3)

## 2019-06-16 LAB — TSH: TSH: 3.55 (ref <=5.90)

## 2019-06-16 MED ORDER — HYDRALAZINE HCL 25 MG PO TABS: 25.0000 mg | ORAL_TABLET | Freq: Three times a day (TID) | ORAL | 3 refills | Status: DC

## 2019-06-18 DIAGNOSIS — E669 Obesity, unspecified: Secondary | ICD-10-CM | POA: Diagnosis not present

## 2019-06-18 DIAGNOSIS — M1A09X0 Idiopathic chronic gout, multiple sites, without tophus (tophi): Secondary | ICD-10-CM | POA: Diagnosis not present

## 2019-06-18 DIAGNOSIS — M0609 Rheumatoid arthritis without rheumatoid factor, multiple sites: Secondary | ICD-10-CM | POA: Diagnosis not present

## 2019-06-18 DIAGNOSIS — Z6832 Body mass index (BMI) 32.0-32.9, adult: Secondary | ICD-10-CM | POA: Diagnosis not present

## 2019-06-18 DIAGNOSIS — M2550 Pain in unspecified joint: Secondary | ICD-10-CM | POA: Diagnosis not present

## 2019-06-18 DIAGNOSIS — M7989 Other specified soft tissue disorders: Secondary | ICD-10-CM | POA: Diagnosis not present

## 2019-06-21 ENCOUNTER — Telehealth: Payer: Self-pay | Admitting: Cardiology

## 2019-06-21 NOTE — Telephone Encounter (Signed)
Returned call to patient of Dr. Percival Spanish. His hydralazine was recently been increased to 6 pills per day. He reports dizziness, lightheadedness, stomach pain, N/V. Denies chest pain. Reports a little shortness of breath. Denies pre-scynope. He reports symptoms going on for about 1 week.   BP 158/68, 143/72, 159/62, 146/65, 149/71, and occasional 258-948 systolic reading  Offered 06/24/19 8am virtual visit but patient declined  Routed to MD and primary nurse for review/advice

## 2019-06-21 NOTE — Telephone Encounter (Signed)
New Message  Pt c/o medication issue:  1. Name of Medication: hydrALAZINE (APRESOLINE) 25 MG tablet  2. How are you currently taking this medication (dosage and times per day)?  Take 2 tablet (25 mg total) by mouth 3 (three) times daily. 3. Are you having a reaction (difficulty breathing--STAT)? Yes  4. What is your medication issue? Patient states that he is dizzy and has an upset stomach, nausea, vomiting, and stomach pain. States that he is also experiencing lightheadedness. Please give patient a call back to discuss.

## 2019-06-22 NOTE — Telephone Encounter (Signed)
Spoke with patient and scheduled virtual visit with Dr Percival Spanish Thursday 9/24. Patient aware it will be a telephone call. Patient also having blood sugars of over 200 at times. Advised patient to call Dr Dwyane Dee to discuss elevated BS

## 2019-06-22 NOTE — Telephone Encounter (Signed)
I would like for him to have a med reconciliation visit with the one of the PharmDs.  I don't see the most updated med list and it needs to be checked for accuracy.  He is on several meds that could cause nausea and might need some lab work but would be good for a virtual visit with a Pharm D or APP to begin with to sort out the meds.

## 2019-06-23 ENCOUNTER — Other Ambulatory Visit: Payer: Self-pay

## 2019-06-23 NOTE — Progress Notes (Signed)
Virtual Visit via Telephone Note   This visit type was conducted due to national recommendations for restrictions regarding the COVID-19 Pandemic (e.g. social distancing) in an effort to limit this patient's exposure and mitigate transmission in our community.  Due to his co-morbid illnesses, this patient is at least at moderate risk for complications without adequate follow up.  This format is felt to be most appropriate for this patient at this time.  The patient did not have access to video technology/had technical difficulties with video requiring transitioning to audio format only (telephone).  All issues noted in this document were discussed and addressed.  No physical exam could be performed with this format.  Please refer to the patient's chart for his  consent to telehealth for [facility].   Date:  06/24/2019   ID:  Alec [name], DOB 1945-10-21, MRN [id number]  Patient Location: Home Provider Location: Home  PCP:  Mayra Neer, MD  Cardiologist:  Minus Breeding, MD  Electrophysiologist:  None   Evaluation Performed:  Follow-Up Visit  Chief Complaint:  Hypertension  History of Present Illness:    Alec [name] is a 73 y.o. male who presents for ongoing assessment and management of coronary artery disease, with history of coronary artery bypass grafting, 07/20/2018, (LIMA to LAD, SVG to PDA, and SVG to intermedius), hypertension, hyperlipidemia, with other history to include diabetes and chronic kidney disease stage III.  He has called recently and had nausea and HTN.  He says that his nausea is better as he takes his meds with food.  However, his BP is going up.  It is up despite being in increasing doses of hydralazine over the past few weeks.  Nothing else has changed.  The patient denies any new symptoms such as chest discomfort, neck or arm discomfort. There has been no new shortness of breath, PND or orthopnea. There has been no presyncope or syncope. However, he does  have palpitations everynight.    The patient does not have symptoms concerning for COVID-19 infection (fever, chills, cough, or new shortness of breath).    Past Medical History:  Diagnosis Date  . Anxiety   . Arthritis    "maybe in my hands" (07/15/2018)  . Bladder cancer ([place]) 2009  . CKD (chronic kidney disease), stage III ([place])   . Coronary artery disease   . Degenerative arthritis of shoulder region 08/2013   left  . Disorder of bursae and tendons in left shoulder region 08/2013  . Gout    "on daily RX" (07/15/2018)  . History of kidney stones   . Hyperlipidemia   . Hypertension    under control with meds., has been on med. > 10 yr.  . IDDM (insulin dependent diabetes mellitus) ([place])    Past Surgical History:  Procedure Laterality Date  . CARDIAC CATHETERIZATION  07/15/2018  . CHOLECYSTECTOMY  08/29/2012   Procedure: LAPAROSCOPIC CHOLECYSTECTOMY WITH INTRAOPERATIVE CHOLANGIOGRAM;  Surgeon: Imogene Burn. Tsuei, MD;  [facility];  Service: General;  Laterality: N/A;  . CORONARY ARTERY BYPASS GRAFT N/A 07/20/2018   Procedure: CORONARY ARTERY BYPASS GRAFTING (CABG) times three using left internal mammary artery to the LAD, and using endoscopically harvested right saphenous vein to PDA and intermedius.;  Surgeon: Grace Isaac, MD;  Location: [place];  Service: Open Heart Surgery;  Laterality: N/A;  . LEFT HEART CATH AND CORONARY ANGIOGRAPHY N/A 07/15/2018   Procedure: LEFT HEART CATH AND CORONARY ANGIOGRAPHY;  Surgeon: Leonie Man, MD;  Location: [facility]  INVASIVE [facility];  Service: Cardiovascular;  Laterality: N/A;  . SHOULDER ARTHROSCOPY W/ ROTATOR CUFF REPAIR Right 2018  . SHOULDER ARTHROSCOPY WITH SUBACROMIAL DECOMPRESSION, ROTATOR CUFF REPAIR AND BICEP TENDON REPAIR Left 09/03/2013   Procedure: LEFT SHOULDER ARTHROSCOPY WITH EXTENSIVE DEBRIDMENT, DISTAL CLAVICULECTOMY, ROTATOR CUFF REPAIR AND SUBACROMIAL DECOMPRESSION PARTIAL ACRIOMIOPLASTY WITH CORACOACROMIAL RELEASE;   Surgeon: Renette Butters, MD;  Location: [place];  Service: Orthopedics;  Laterality: Left;  . TEE WITHOUT CARDIOVERSION N/A 07/20/2018   Procedure: TRANSESOPHAGEAL ECHOCARDIOGRAM (TEE);  Surgeon: Grace Isaac, MD;  Location: [place];  Service: Open Heart Surgery;  Laterality: N/A;  . TRANSURETHRAL RESECTION OF BLADDER TUMOR WITH MITOMYCIN-C  08/24/2008     Current Meds  Medication Sig  . allopurinol (ZYLOPRIM) 100 MG tablet Take 200 mg by mouth daily after supper.  Marland Kitchen amLODipine (NORVASC) 10 MG tablet Take 1 tablet (10 mg total) by mouth daily.  Marland Kitchen aspirin EC 81 MG tablet Take 81 mg by mouth daily.  Marland Kitchen atorvastatin (LIPITOR) 40 MG tablet Take 1 tablet (40 mg total) by mouth daily at 6 PM.  . Cholecalciferol (VITAMIN D) 2000 units tablet Take 2,000 Units by mouth daily after supper.  . folic acid (FOLVITE) 1 MG tablet Take 1 mg by mouth daily.  . Glucosamine-Chondroitin (OSTEO BI-FLEX REGULAR STRENGTH PO) Take 1 tablet by mouth daily after supper.  . hydrALAZINE (APRESOLINE) 25 MG tablet Take 1 tablet (25 mg total) by mouth 3 (three) times daily. (Patient taking differently: Take 50 mg by mouth 3 (three) times daily. )  . insulin aspart (NOVOLOG) 100 UNIT/ML injection INJECT 85 UNITS SUBCUTANEOUS ONCE DAILY WITH INSULIN PUMP (Patient taking differently: INJECT 85 UNITS SUBCUTANEOUS EVERY TWO DAYS WITH INSULIN PUMP)  . Insulin Disposable Pump (OMNIPOD 5 PACK) MISC Place 1 each onto the skin as directed. Place 1 onto skin as directed every 48 hours  . methotrexate (RHEUMATREX) 2.5 MG tablet Take 2.5 mg by mouth once a week. Pt take 8 tablets once a week  . metoprolol tartrate (LOPRESSOR) 25 MG tablet Take 1 tablet (25 mg total) by mouth 2 (two) times daily.  . Misc Natural Products (TART CHERRY ADVANCED PO) Take by mouth.  . Multiple Vitamins-Minerals (ADVANCED DIABETIC MULTIVITAMIN) TABS Take 2 tablets by mouth daily after supper.  . sertraline (ZOLOFT) 50 MG tablet Take 25  mg by mouth daily.      Allergies:   Patient has no known allergies.   Social History   Tobacco Use  . Smoking status: Former Smoker    Packs/day: 2.00    Years: 25.00    Pack years: 50.00    Types: Cigarettes    Quit date: 09/11/1997    Years since quitting: 21.7  . Smokeless tobacco: Never Used  Substance Use Topics  . Alcohol use: Not Currently    Comment: 07/17/2018 "nothing since the mid 1990s"  . Drug use: Never     Family Hx: The patient's family history includes CAD in his maternal grandfather and paternal grandfather; Cancer in his father; Coronary artery disease in his father; Diabetes in his mother; Sudden death in his son.  ROS:   Please see the history of present illness.     All other systems reviewed and are negative.   Prior CV studies:   The following studies were reviewed today:  None  Labs/Other Tests and Data Reviewed:    EKG:  No ECG reviewed.  Recent Labs: 07/14/2018: TSH 2.520 07/21/2018: Magnesium 2.3 07/23/2018: Hemoglobin  10.3; Platelets 125 04/19/2019: ALT 30; [name] 23; Creatinine, Ser 1.73; Potassium 4.2; Sodium 140   Recent Lipid Panel Lab Results  Component Value Date/Time   CHOL 94 07/16/2018 07:31 AM   TRIG 120 07/16/2018 07:31 AM   HDL 25 (L) 07/16/2018 07:31 AM   CHOLHDL 3.8 07/16/2018 07:31 AM   LDLCALC 45 07/16/2018 07:31 AM    Wt Readings from Last 3 Encounters:  06/24/19 203 lb (92.1 kg)  05/24/19 201 lb (91.2 kg)  11/10/18 197 lb 12.8 oz (89.7 kg)     Objective:    Vital Signs:  BP (!) 174/70   Pulse 61   Ht 5\' 6"  (1.676 m)   Wt 203 lb (92.1 kg)   BMI 32.77 kg/m    VITAL SIGNS:  reviewed  ASSESSMENT & PLAN:     Coronary artery disease: History of CABG in October 2019 with LIMA to LAD, SVG to PDA and SVG to intermedius.  He has no chest pain.  No further ischemia work up.   Hypertension:  I am not sure why the BP is going up.  I am going to get the most recent labs from the PCP .  I am going to start  clonidine 0.1 mg po bid and reduce the hydralazine to 25 mg tid and perhaps off.  He needs to come back to the office in a few weeks for BP an appt .  In the meantime he can send [place] readings.   Hypercholesterolemia:  Continue current therapy.   Insulin-dependent diabetes:   Per his endocrinologist.   Chronic renal insufficiency:   This is followed by nephrology and I will get the labs for my records.   Palpitations:  He needs a 3 day Holter.     COVID-19 Education: The signs and symptoms of COVID-19 were discussed with the patient and how to seek care for testing (follow up with PCP or arrange E-visit).  The importance of social distancing was discussed today.  Time:   Today, I have spent 25 minutes with the patient with telehealth technology discussing the above problems.     Medication Adjustments/Labs and Tests Ordered: Current medicines are reviewed at length with the patient today.  Concerns regarding medicines are outlined above.   Tests Ordered: No orders of the defined types were placed in this encounter.   Medication Changes: No orders of the defined types were placed in this encounter.   Follow Up:  In Person in three weeks  Signed, Minus Breeding, MD  06/24/2019 8:32 AM    [place]

## 2019-06-24 ENCOUNTER — Telehealth (INDEPENDENT_AMBULATORY_CARE_PROVIDER_SITE_OTHER): Payer: Medicare Other | Admitting: Cardiology

## 2019-06-24 ENCOUNTER — Encounter: Payer: Self-pay | Admitting: Cardiology

## 2019-06-24 VITALS — BP 174/70 | HR 61 | Ht 66.0 in | Wt 203.0 lb

## 2019-06-24 DIAGNOSIS — I10 Essential (primary) hypertension: Secondary | ICD-10-CM

## 2019-06-24 DIAGNOSIS — R110 Nausea: Secondary | ICD-10-CM

## 2019-06-24 DIAGNOSIS — I2510 Atherosclerotic heart disease of native coronary artery without angina pectoris: Secondary | ICD-10-CM

## 2019-06-24 DIAGNOSIS — R002 Palpitations: Secondary | ICD-10-CM

## 2019-06-24 MED ORDER — CLONIDINE HCL 0.1 MG PO TABS: 0.1000 mg | ORAL_TABLET | Freq: Once | ORAL | 3 refills | Status: DC

## 2019-06-24 NOTE — Patient Instructions (Signed)
Medication Instructions:  Decrease Hydralazine to 25mg  three times daily. Start taking Clonidine 0.1mg  twice daily.   If you need a refill on your cardiac medications before your next appointment, please call your pharmacy.   Lab work: NONE  Testing/Procedures: Your physician has recommended that you wear a 3 day DAY ZIO-PATCH monitor. The Zio patch cardiac monitor continuously records heart rhythm data for up to 14 days, this is for patients being evaluated for multiple types heart rhythms. For the first 24 hours post application, please avoid getting the Zio monitor wet in the shower or by excessive sweating during exercise. After that, feel free to carry on with regular activities. Keep soaps and lotions away from the ZIO XT Patch.  This will be mailed to you with instructions.   Follow-Up: Follow up Appointment with Alec [name] on October 14th, 2020 @ 8:45am.

## 2019-06-25 ENCOUNTER — Telehealth: Payer: Self-pay | Admitting: Endocrinology

## 2019-06-25 ENCOUNTER — Telehealth: Payer: Self-pay | Admitting: Cardiology

## 2019-06-25 NOTE — Telephone Encounter (Signed)
° °*  STAT* If patient is at the pharmacy, call can be transferred to refill team.   1. Which medications need to be refilled? (please list name of each medication and dose if known)   hydrALAZINE (APRESOLINE) 25 MG tablet  2. Which pharmacy/location (including street and city if local pharmacy) is medication to be sent to? [id number] - [place], [place] [address]  3. Do they need a 30 day or 90 day supply? 90  Patient was directed to take a total of 6 tablets daily, then Dr. Percival Spanish reduced the total amoutn to 3 tablets daily. The pharmacy will not refill the medication before 10/07 because his insurance will not cover it.   Patient is out of medication

## 2019-06-25 NOTE — Telephone Encounter (Signed)
MEDICATION: FreeStyle Lite Test Strips  PHARMACY:  [id number] - [place], [place] - [address]  IS THIS A 90 DAY SUPPLY :   IS PATIENT OUT OF MEDICATION: Yes  IF NOT; HOW MUCH IS LEFT:   LAST APPOINTMENT DATE: @7 /23/2020  NEXT APPOINTMENT DATE:@Visit  date not found  DO WE HAVE YOUR PERMISSION TO LEAVE A DETAILED MESSAGE:  OTHER COMMENTS:    **Let patient know to contact pharmacy at the end of the day to make sure medication is ready. **  ** Please notify patient to allow 48-72 hours to process**  **Encourage patient to contact the pharmacy for refills or they can request refills through [facility]**

## 2019-06-28 ENCOUNTER — Other Ambulatory Visit: Payer: Self-pay

## 2019-06-28 MED ORDER — GLUCOSE BLOOD VI STRP: ORAL_STRIP | 2 refills | Status: DC

## 2019-06-28 NOTE — Telephone Encounter (Signed)
Rx sent 

## 2019-06-29 ENCOUNTER — Other Ambulatory Visit: Payer: Self-pay

## 2019-06-29 DIAGNOSIS — I10 Essential (primary) hypertension: Secondary | ICD-10-CM

## 2019-06-29 MED ORDER — HYDRALAZINE HCL 25 MG PO TABS: 25.0000 mg | ORAL_TABLET | Freq: Three times a day (TID) | ORAL | 3 refills | Status: DC

## 2019-06-30 ENCOUNTER — Telehealth: Payer: Self-pay | Admitting: *Deleted

## 2019-06-30 NOTE — Telephone Encounter (Signed)
3 day ZIO XT long term holter monitor to be mailed to the patients home.  Instructions reviewed briefly as they are included in the monitor kit. 

## 2019-07-05 ENCOUNTER — Ambulatory Visit (INDEPENDENT_AMBULATORY_CARE_PROVIDER_SITE_OTHER): Payer: Medicare Other

## 2019-07-05 DIAGNOSIS — R002 Palpitations: Secondary | ICD-10-CM | POA: Diagnosis not present

## 2019-07-05 DIAGNOSIS — I10 Essential (primary) hypertension: Secondary | ICD-10-CM

## 2019-07-06 DIAGNOSIS — Z86010 Personal history of colonic polyps: Secondary | ICD-10-CM | POA: Diagnosis not present

## 2019-07-12 DIAGNOSIS — B349 Viral infection, unspecified: Secondary | ICD-10-CM | POA: Diagnosis not present

## 2019-07-12 DIAGNOSIS — K529 Noninfective gastroenteritis and colitis, unspecified: Secondary | ICD-10-CM | POA: Diagnosis not present

## 2019-07-14 ENCOUNTER — Ambulatory Visit: Payer: Medicare Other | Admitting: Adult Health

## 2019-07-14 DIAGNOSIS — R002 Palpitations: Secondary | ICD-10-CM | POA: Diagnosis not present

## 2019-07-21 DIAGNOSIS — H5203 Hypermetropia, bilateral: Secondary | ICD-10-CM | POA: Diagnosis not present

## 2019-07-21 DIAGNOSIS — H40013 Open angle with borderline findings, low risk, bilateral: Secondary | ICD-10-CM | POA: Diagnosis not present

## 2019-07-21 DIAGNOSIS — E119 Type 2 diabetes mellitus without complications: Secondary | ICD-10-CM | POA: Diagnosis not present

## 2019-07-21 DIAGNOSIS — H04123 Dry eye syndrome of bilateral lacrimal glands: Secondary | ICD-10-CM | POA: Diagnosis not present

## 2019-07-21 DIAGNOSIS — H259 Unspecified age-related cataract: Secondary | ICD-10-CM | POA: Diagnosis not present

## 2019-07-21 DIAGNOSIS — H52223 Regular astigmatism, bilateral: Secondary | ICD-10-CM | POA: Diagnosis not present

## 2019-07-21 DIAGNOSIS — H524 Presbyopia: Secondary | ICD-10-CM | POA: Diagnosis not present

## 2019-07-21 LAB — HM DIABETES EYE EXAM

## 2019-07-27 DIAGNOSIS — Z23 Encounter for immunization: Secondary | ICD-10-CM | POA: Diagnosis not present

## 2019-07-27 DIAGNOSIS — I129 Hypertensive chronic kidney disease with stage 1 through stage 4 chronic kidney disease, or unspecified chronic kidney disease: Secondary | ICD-10-CM | POA: Diagnosis not present

## 2019-08-05 ENCOUNTER — Telehealth: Payer: Self-pay | Admitting: Cardiology

## 2019-08-05 NOTE — Telephone Encounter (Signed)
Ask him to record his BP twice daily and bring the results to me.

## 2019-08-05 NOTE — Telephone Encounter (Signed)
Spoke to pt. States his last BP 161/90. He is not currently symptomatic but has dizziness and headache's "off and on". States that he is on 4 different BP meds and wants to figure out why he continues to have high BP. Advised pt to look for symptoms and to call back or 911 if he starts to experience blurred vision, severe headache, dizziness that does not subside, chest pain, slurred speech. Verbalized understanding. Made an appt with Dr Percival Spanish. 11/17. Will forward to Dr Percival Spanish for review.

## 2019-08-05 NOTE — Telephone Encounter (Signed)
Pt c/o BP issue: STAT if pt c/o blurred vision, one-sided weakness or slurred speech  1. What are your last 5 BP readings? 161/80  2. Are you having any other symptoms (ex. Dizziness, headache, blurred vision, passed out)? Headaches and dizziness  3. What is your BP issue? Patient states that he can not get his BP lowered. States he is on 3 medications for his bp (amLODipine (NORVASC) 10 MG tablet, atorvastatin (LIPITOR) 40 MG tablet, metoprolol tartrate (LOPRESSOR) 25 MG tablet ) Thinks something may need to be done about the medication. States he was supposed to see Bunnie Domino for an appt on 07/14/19 but had to cancel due to Covid like symptoms.

## 2019-08-05 NOTE — Telephone Encounter (Signed)
Gave pt Dr Percival Spanish advice. Verbalized understanding.

## 2019-08-16 NOTE — Progress Notes (Signed)
Cardiology Office Note   Date:  08/17/2019   ID:  Alec [name], DOB May 28, 1946, MRN [id number]  PCP:  Alec Neer, MD  Cardiologist:   Minus Breeding, MD   Chief Complaint  Patient presents with  . Shortness of Breath      History of Present Illness: Alec [name] is a 73 y.o. male who presents for follow up of CAD status post CABG.   He has had increased BPs since I last saw him.  He continues to have some blood pressures that are elevated but he has not really brought the diary today and he is recall that he has been in the 875I to 433I systolic.  He has some occasional shortness of breath doing something like raking leaves but this is not different.  He says this is better than prior to his bypass.  He is not having any new chest pressure, neck or arm discomfort.  He has some rare chest fluttering but is not really a problem.  Denies any PND or orthopnea.  He said no weight gain or edema.  He walks routinely for exercise   Past Medical History:  Diagnosis Date  . Anxiety   . Arthritis    "maybe in my hands" (07/15/2018)  . Bladder cancer ([place]) 2009  . CKD (chronic kidney disease), stage III   . Coronary artery disease   . Degenerative arthritis of shoulder region 08/2013   left  . Disorder of bursae and tendons in left shoulder region 08/2013  . Gout    "on daily RX" (07/15/2018)  . History of kidney stones   . Hyperlipidemia   . Hypertension    under control with meds., has been on med. > 10 yr.  . IDDM (insulin dependent diabetes mellitus)     Past Surgical History:  Procedure Laterality Date  . CARDIAC CATHETERIZATION  07/15/2018  . CHOLECYSTECTOMY  08/29/2012   Procedure: LAPAROSCOPIC CHOLECYSTECTOMY WITH INTRAOPERATIVE CHOLANGIOGRAM;  Surgeon: Alec Burn. Tsuei, MD;  Location: [facility];  Service: General;  Laterality: N/A;  . CORONARY ARTERY BYPASS GRAFT N/A 07/20/2018   Procedure: CORONARY ARTERY BYPASS GRAFTING (CABG) times three using left internal  mammary artery to the LAD, and using endoscopically harvested right saphenous vein to PDA and intermedius.;  Surgeon: Alec Isaac, MD;  Location: [place];  Service: Open Heart Surgery;  Laterality: N/A;  . LEFT HEART CATH AND CORONARY ANGIOGRAPHY N/A 07/15/2018   Procedure: LEFT HEART CATH AND CORONARY ANGIOGRAPHY;  Surgeon: Alec Man, MD;  Location: [facility];  Service: Cardiovascular;  Laterality: N/A;  . SHOULDER ARTHROSCOPY W/ ROTATOR CUFF REPAIR Right 2018  . SHOULDER ARTHROSCOPY WITH SUBACROMIAL DECOMPRESSION, ROTATOR CUFF REPAIR AND BICEP TENDON REPAIR Left 09/03/2013   Procedure: LEFT SHOULDER ARTHROSCOPY WITH EXTENSIVE DEBRIDMENT, DISTAL CLAVICULECTOMY, ROTATOR CUFF REPAIR AND SUBACROMIAL DECOMPRESSION PARTIAL ACRIOMIOPLASTY WITH CORACOACROMIAL RELEASE;  Surgeon: Renette Butters, MD;  Location: [facility];  Service: Orthopedics;  Laterality: Left;  . TEE WITHOUT CARDIOVERSION N/A 07/20/2018   Procedure: TRANSESOPHAGEAL ECHOCARDIOGRAM (TEE);  Surgeon: Alec Isaac, MD;  Location: [facility];  Service: Open Heart Surgery;  Laterality: N/A;  . TRANSURETHRAL RESECTION OF BLADDER TUMOR WITH MITOMYCIN-C  08/24/2008     Current Outpatient Medications  Medication Sig Dispense Refill  . allopurinol (ZYLOPRIM) 100 MG tablet Take 200 mg by mouth daily after supper.    Marland Kitchen amLODipine (NORVASC) 10 MG tablet Take 1 tablet (10 mg total) by mouth daily. [place]  tablet 3  . aspirin EC 81 MG tablet Take 81 mg by mouth daily.    Marland Kitchen atorvastatin (LIPITOR) 40 MG tablet Take 1 tablet (40 mg total) by mouth daily at 6 PM. 90 tablet 3  . buPROPion (WELLBUTRIN XL) 150 MG 24 hr tablet Take 150 mg by mouth daily.    . Cholecalciferol (VITAMIN D) 2000 units tablet Take 2,000 Units by mouth daily after supper.    . cloNIDine (CATAPRES) 0.1 MG tablet Take 1 tablet (0.1 mg total) by mouth once for 1 dose. 812 tablet 3  . folic acid (FOLVITE) 1 MG tablet Take 1 mg by mouth daily.    .  Glucosamine-Chondroitin (OSTEO BI-FLEX REGULAR STRENGTH PO) Take 1 tablet by mouth daily after supper.    Marland Kitchen glucose blood test strip Use Freestyle Lite test strips as instructed to check blood sugar 3 times daily. 100 each 2  . hydrALAZINE (APRESOLINE) 50 MG tablet Take 1 tablet (50 mg total) by mouth 3 (three) times daily. 270 tablet 3  . insulin aspart (NOVOLOG) 100 UNIT/ML injection INJECT 85 UNITS SUBCUTANEOUS ONCE DAILY WITH INSULIN PUMP (Patient taking differently: INJECT 85 UNITS SUBCUTANEOUS EVERY TWO DAYS WITH INSULIN PUMP) 70 mL 3  . Insulin Disposable Pump (OMNIPOD 5 PACK) MISC Place 1 each onto the skin as directed. Place 1 onto skin as directed every 48 hours 15 each 11  . methotrexate (RHEUMATREX) 2.5 MG tablet Take 2.5 mg by mouth once a week. Pt take 8 tablets once a week    . metoprolol tartrate (LOPRESSOR) 25 MG tablet Take 1 tablet (25 mg total) by mouth 2 (two) times daily. 180 tablet 3  . Misc Natural Products (TART CHERRY ADVANCED PO) Take by mouth.    . Multiple Vitamins-Minerals (ADVANCED DIABETIC MULTIVITAMIN) TABS Take 2 tablets by mouth daily after supper.    . nitroGLYCERIN (NITROSTAT) 0.4 MG SL tablet Place 1 tablet (0.4 mg total) under the tongue every 5 (five) minutes as needed for chest pain. (Patient not taking: Reported on 06/24/2019) 30 tablet 1  . sertraline (ZOLOFT) 50 MG tablet Take 25 mg by mouth daily.     . traZODone (DESYREL) 50 MG tablet TAKE 1 TABLET BY MOUTH EVERY DAY AT BEDTIME AS NEEDED     No current facility-administered medications for this visit.     Allergies:   Patient has no known allergies.     ROS:  Please see the history of present illness.   Otherwise, review of systems are positive for none.   All other systems are reviewed and negative.    PHYSICAL EXAM: VS:  BP (!) 150/60   Pulse 63   Wt 178 lb (80.7 kg)   SpO2 99%   BMI 28.73 kg/m  , BMI Body mass index is 28.73 kg/m. GENERAL:  Well appearing NECK:  No jugular venous  distention, waveform within normal limits, carotid upstroke brisk and symmetric, no bruits, no thyromegaly LUNGS:  Clear to auscultation bilaterally CHEST:  Well healed sternotomy scar. HEART:  PMI not displaced or sustained,S1 and S2 within normal limits, no S3, no S4, no clicks, no rubs, no murmurs ABD:  Flat, positive bowel sounds normal in frequency in pitch, no bruits, no rebound, no guarding, no midline pulsatile mass, no hepatomegaly, no splenomegaly EXT:  2 plus pulses throughout, no edema, no cyanosis no clubbing   EKG:  EKG is not ordered today.    Recent Labs: 04/19/2019: ALT 30; BUN 23; Potassium 4.2; Sodium 140 06/16/2019:  Creatinine 1.4; TSH 3.55    Lipid Panel    Component Value Date/Time   CHOL 104 06/16/2019   TRIG 106 06/16/2019   HDL 70 06/16/2019   CHOLHDL 3.8 07/16/2018 0731   VLDL 24 07/16/2018 0731   LDLCALC 49 06/16/2019   Lab Results  Component Value Date   CREATININE 1.4 (A) 06/16/2019   Lab Results  Component Value Date   HGBA1C 7.2 (H) 04/19/2019     Wt Readings from Last 3 Encounters:  08/17/19 178 lb (80.7 kg)  06/24/19 203 lb (92.1 kg)  05/24/19 201 lb (91.2 kg)      Other studies Reviewed: Additional studies/ records that were reviewed today include:  Labs Review of the above records demonstrates:  See below   ASSESSMENT AND PLAN:  CAD:    The patient has no new sypmtoms.  No further cardiovascular testing is indicated.  We will continue with aggressive risk reduction and meds as listed.  HTN:  The blood pressure is not at target.  Again increase hydralazine to 50 mg twice daily.    HYPERLIPIDEMIA:   LDL was 49 and HDL 70.  No change in therapy.   DM:   A1C was 7.1 which is down from 7.5.  No change.   CKD III:   Creat is stable at 1.44 which is down slightly from previous.      Current medicines are reviewed at length with the patient today.  The patient does not have concerns regarding medicines.  The following changes  have been made:  None  Labs/ tests ordered today include:    No orders of the defined types were placed in this encounter.    Disposition:   FU with Kerin Ransom PAC in six months.    Signed, Minus Breeding, MD  08/17/2019 4:22 PM    [facility]

## 2019-08-17 ENCOUNTER — Other Ambulatory Visit: Payer: Self-pay

## 2019-08-17 ENCOUNTER — Encounter: Payer: Self-pay | Admitting: Cardiology

## 2019-08-17 ENCOUNTER — Ambulatory Visit (INDEPENDENT_AMBULATORY_CARE_PROVIDER_SITE_OTHER): Payer: Medicare Other | Admitting: Cardiology

## 2019-08-17 VITALS — BP 150/60 | HR 63 | Wt 178.0 lb

## 2019-08-17 DIAGNOSIS — I2510 Atherosclerotic heart disease of native coronary artery without angina pectoris: Secondary | ICD-10-CM | POA: Diagnosis not present

## 2019-08-17 DIAGNOSIS — E785 Hyperlipidemia, unspecified: Secondary | ICD-10-CM

## 2019-08-17 DIAGNOSIS — I10 Essential (primary) hypertension: Secondary | ICD-10-CM | POA: Diagnosis not present

## 2019-08-17 DIAGNOSIS — N1832 Chronic kidney disease, stage 3b: Secondary | ICD-10-CM

## 2019-08-17 DIAGNOSIS — E118 Type 2 diabetes mellitus with unspecified complications: Secondary | ICD-10-CM

## 2019-08-17 MED ORDER — HYDRALAZINE HCL 50 MG PO TABS: 50.0000 mg | ORAL_TABLET | Freq: Three times a day (TID) | ORAL | 3 refills | Status: DC

## 2019-08-17 NOTE — Patient Instructions (Signed)
Medication Instructions:  Take 50mg  Hydralazine 3 times daily.  If you need a refill on your cardiac medications before your next appointment, please call your pharmacy.   Lab work: NONE  Testing/Procedures: NONE  Follow-Up: At [facility], you and your health needs are our priority.  As part of our continuing mission to provide you with exceptional heart care, we have created designated Provider Care Teams.  These Care Teams include your primary Cardiologist (physician) and Advanced Practice Providers (APPs -  Physician Assistants and Nurse Practitioners) who all work together to provide you with the care you need, when you need it. You may see Minus Breeding, MD or one of the following Advanced Practice Providers on your designated Care Team:    Rosaria Ferries, PA-C  Jory Sims, DNP, ANP  Cadence Kathlen Mody, NP  Your physician wants you to follow-up in: 6 months. You will receive a reminder letter in the mail two months in advance. If you don't receive a letter, please call our office to schedule the follow-up appointment.

## 2019-08-18 NOTE — Progress Notes (Signed)
Patient ID: Alec [name], male   DOB: [birth year], 73 y.o.   MRN: [id number]             Reason for Appointment:  Follow-up for Type 2 Diabetes    History of Present Illness:          Diagnosis: Type 2 diabetes mellitus, date of diagnosis: 2001       Prior history: He was seen in consultation in 11/2014 when his A1c was 7.4 Since his blood sugars were averaging over 200 he was given Victoza in addition to his basal bolus insulin regimen in 6/16 He started on the V go pump in October 2016  Recent history:    INSULIN regimen is described as:  BASAL rate on Omnipod pump:  Midnight = 2.4, 4 AM 2.2 and 10 PM 2.4  Boluses 12-14 at breakfast, 12 units lunch and 17-18 at dinner  Non-insulin hypoglycemic drugs the patient is taking are: Victoza 1.2 mg daily   His A1c has been better the last 2.1 mg by PCP in 9/28 was 7.1  Previously higher at 8.5, has been as low as 6.8 previously   Current blood sugar patterns from analysis of his meter download, daily management and problems identified:  He is now able to use his CGM but an armband and cover  With his being retired now he has been eating healthier meals, also likely to be more consistently active  He has lost at least 3 to 4 pounds also recently  Also continues to be on Victoza  As discussed in the CGM interpretation below his blood sugars are higher after breakfast when he is eating pancakes and oatmeal without protein  Also is bolusing higher amounts at dinnertime which may cause near normal or low sugars occasionally  Also in the prior week was getting some nocturnal hypoglycemia  Side effects from medications have been:none     Glucose monitoring:  Freestyle libre   CONTINUOUS GLUCOSE MONITORING RECORD INTERPRETATION    Dates of Recording: 11/6 through 11/19  Sensor description: [employer]  Results statistics:   CGM use % of time  90  Average and SD  136, GV 38  Time in range    76    %  % Time Above 180  15   % Time above 250  3  % Time Below target  6    PRE-MEAL Fasting Lunch Dinner Bedtime Overall  Glucose range:       Mean/median:  100  167  154  127  136   POST-MEAL PC Breakfast PC Lunch PC Dinner  Glucose range:     Mean/median:  177  160  128    Glycemic patterns summary: Blood sugars are within the target range most of the time However as significant variability between breakfast time and dinnertime especially after breakfast Blood sugars are trending lower after dinner and once had a low sugar In the first week of his data he had 3 episodes of low sugars between 2-4 AM  Hyperglycemic episodes are occurring primarily after breakfast and occasionally after lunch, usually not after dinner  Hypoglycemic episodes occurred overnight and once after dinner  Overnight periods: Blood sugars are variable after midnight but generally trending lower after 4 AM Low blood sugars occurred in the first week 3 times between 1 AM and 5 AM  Preprandial periods: Blood sugars are excellent at breakfast time Mostly high at lunchtime with some variability and averaging about 150 at dinnertime  Postprandial  periods:   After breakfast:   Blood sugars are quite variable and may go up occasionally over 200 depending on his diet  After lunch:   Blood sugars are somewhat variable but usually on an average flat after the meal  After dinner: Glucose readings are relatively level between before and after the meal but occasionally lower    Self-care:  Meals: 3 meals per day. Lunch  1 pm Dinner 6 pm Breakfast is usually an egg sandwich but sometimes biscuits   Eating balanced meals in the evening.  He will have snacks with granola bars              Dietician visit, most recent: 6/15.               Weight history:  Wt Readings from Last 3 Encounters:  08/19/19 197 lb 12.8 oz (89.7 kg)  08/17/19 178 lb (80.7 kg)  06/24/19 203 lb (92.1 kg)    Glycemic control:   Lab Results  Component Value  Date   HGBA1C 7.2 (H) 04/19/2019   HGBA1C 8.5 (H) 01/19/2019   HGBA1C 7.5 (H) 10/19/2018   Lab Results  Component Value Date   MICROALBUR 5.9 (H) 06/16/2018   LDLCALC 49 06/16/2019   CREATININE 1.4 (A) 06/16/2019     Past history:  He was not symptomatic at time of diagnosis and was probably treated with Amaryl only.  No details are available of previous management However he thinks he was not given metformin at any time because of his "kidney problem" He had a A1c of over 9% in 2013 and may have been started on insulin at that time. Most likely has been taking Levemir and NovoLog since then A1c records show his results were over 8% in 2014 and in 4/15 but subsequently down to 7.1 in November 2015 Amaryl was stopped in 7/16  Other active problems are addressed in Review of systems   Allergies as of 08/19/2019   No Known Allergies     Medication List       Accurate as of August 19, 2019  2:52 PM. If you have any questions, ask your nurse or doctor.        Advanced Diabetic Multivitamin Tabs Take 2 tablets by mouth daily after supper.   allopurinol 100 MG tablet Commonly known as: ZYLOPRIM Take 200 mg by mouth daily after supper.   amLODipine 10 MG tablet Commonly known as: NORVASC Take 1 tablet (10 mg total) by mouth daily.   aspirin EC 81 MG tablet Take 81 mg by mouth daily.   atorvastatin 40 MG tablet Commonly known as: LIPITOR Take 1 tablet (40 mg total) by mouth daily at 6 PM.   buPROPion 150 MG 24 hr tablet Commonly known as: WELLBUTRIN XL Take 150 mg by mouth daily.   cloNIDine 0.1 MG tablet Commonly known as: CATAPRES Take 1 tablet (0.1 mg total) by mouth once for 1 dose.   folic acid 1 MG tablet Commonly known as: FOLVITE Take 1 mg by mouth daily.   glucose blood test strip Use Freestyle Lite test strips as instructed to check blood sugar 3 times daily.   hydrALAZINE 50 MG tablet Commonly known as: APRESOLINE Take 1 tablet (50 mg total)  by mouth 3 (three) times daily.   insulin aspart 100 UNIT/ML injection Commonly known as: NovoLOG INJECT 85 UNITS SUBCUTANEOUS ONCE DAILY WITH INSULIN PUMP What changed: additional instructions   methotrexate 2.5 MG tablet Commonly known as: RHEUMATREX Take 2.5  mg by mouth once a week. Pt take 8 tablets once a week   metoprolol tartrate 25 MG tablet Commonly known as: LOPRESSOR Take 1 tablet (25 mg total) by mouth 2 (two) times daily.   nitroGLYCERIN 0.4 MG SL tablet Commonly known as: NITROSTAT Place 1 tablet (0.4 mg total) under the tongue every 5 (five) minutes as needed for chest pain.   OmniPod 5 Pack Misc Place 1 each onto the skin as directed. Place 1 onto skin as directed every 48 hours   OSTEO BI-FLEX REGULAR STRENGTH PO Take 1 tablet by mouth daily after supper.   sertraline 50 MG tablet Commonly known as: ZOLOFT Take 25 mg by mouth daily.   TART CHERRY ADVANCED PO Take by mouth.   traZODone 50 MG tablet Commonly known as: DESYREL TAKE 1 TABLET BY MOUTH EVERY DAY AT BEDTIME AS NEEDED   Vitamin D 50 MCG (2000 UT) tablet Take 2,000 Units by mouth daily after supper.       Allergies: No Known Allergies  Past Medical History:  Diagnosis Date  . Anxiety   . Arthritis    "maybe in my hands" (07/15/2018)  . Bladder cancer ([place]) 2009  . CKD (chronic kidney disease), stage III   . Coronary artery disease   . Degenerative arthritis of shoulder region 08/2013   left  . Disorder of bursae and tendons in left shoulder region 08/2013  . Gout    "on daily RX" (07/15/2018)  . History of kidney stones   . Hyperlipidemia   . Hypertension    under control with meds., has been on med. > 10 yr.  . IDDM (insulin dependent diabetes mellitus)     Past Surgical History:  Procedure Laterality Date  . CARDIAC CATHETERIZATION  07/15/2018  . CHOLECYSTECTOMY  08/29/2012   Procedure: LAPAROSCOPIC CHOLECYSTECTOMY WITH INTRAOPERATIVE CHOLANGIOGRAM;  Surgeon: Imogene Burn.  Tsuei, MD;  Location: [facility];  Service: General;  Laterality: N/A;  . CORONARY ARTERY BYPASS GRAFT N/A 07/20/2018   Procedure: CORONARY ARTERY BYPASS GRAFTING (CABG) times three using left internal mammary artery to the LAD, and using endoscopically harvested right saphenous vein to PDA and intermedius.;  Surgeon: Grace Isaac, MD;  Location: [facility];  Service: Open Heart Surgery;  Laterality: N/A;  . LEFT HEART CATH AND CORONARY ANGIOGRAPHY N/A 07/15/2018   Procedure: LEFT HEART CATH AND CORONARY ANGIOGRAPHY;  Surgeon: Leonie Man, MD;  Location: [facility];  Service: Cardiovascular;  Laterality: N/A;  . SHOULDER ARTHROSCOPY W/ ROTATOR CUFF REPAIR Right 2018  . SHOULDER ARTHROSCOPY WITH SUBACROMIAL DECOMPRESSION, ROTATOR CUFF REPAIR AND BICEP TENDON REPAIR Left 09/03/2013   Procedure: LEFT SHOULDER ARTHROSCOPY WITH EXTENSIVE DEBRIDMENT, DISTAL CLAVICULECTOMY, ROTATOR CUFF REPAIR AND SUBACROMIAL DECOMPRESSION PARTIAL ACRIOMIOPLASTY WITH CORACOACROMIAL RELEASE;  Surgeon: Renette Butters, MD;  Location: [facility];  Service: Orthopedics;  Laterality: Left;  . TEE WITHOUT CARDIOVERSION N/A 07/20/2018   Procedure: TRANSESOPHAGEAL ECHOCARDIOGRAM (TEE);  Surgeon: Grace Isaac, MD;  Location: [facility];  Service: Open Heart Surgery;  Laterality: N/A;  . TRANSURETHRAL RESECTION OF BLADDER TUMOR WITH MITOMYCIN-C  08/24/2008    Family History  Problem Relation Age of Onset  . Diabetes Mother   . Cancer Father   . Coronary artery disease Father        MI at age 65.   . Sudden death Son   . CAD Maternal Grandfather        MI in his 63's  . CAD Paternal Grandfather  Social History:  reports that he quit smoking about 21 years ago. His smoking use included cigarettes. He has a 50.00 pack-year smoking history. He has never used smokeless tobacco. He reports previous alcohol use. He reports that he does not use drugs.    Review of Systems     HYPERTENSION: has been  treated by his PCP with amlodipine 10 mg, hydralazine and clonidine Blood pressure also followed by cardiologist and recently hydralazine was increased        Lipids: he is taking Lipitor 40 mg from his PCP       Lab Results  Component Value Date   CHOL 104 06/16/2019   HDL 70 06/16/2019   LDLCALC 49 06/16/2019   TRIG 106 06/16/2019   CHOLHDL 3.8 07/16/2018               CKD: He has been followed by nephrologist for diabetic nephropathy.  Now being followed annually Last urine microalbumin ratio is much higher, previously had come back to normal with lisinopril  His creatinine is variable   Lab Results  Component Value Date   CREATININE 1.4 (A) 06/16/2019   CREATININE 1.73 (H) 04/19/2019   CREATININE 1.51 (H) 01/19/2019    Last foot exam in 08/2019  Has neuropathic symptoms controlled with low-dose gabapentin, also on Requip from PCP Sometimes will have calf pain at night but not on walking  Eye exam last:  1/19   Physical Examination:  BP (!) 144/66   Pulse 60   Ht 5\' 6"  (1.676 m)   Wt 197 lb 12.8 oz (89.7 kg)   SpO2 97%   BMI 31.93 kg/m      Trace ankle edema on the right  Diabetic Foot Exam - Simple   Simple Foot Form Diabetic Foot exam was performed with the following findings: Yes   Visual Inspection No deformities, no ulcerations, no other skin breakdown bilaterally: Yes Sensation Testing Intact to touch and monofilament testing bilaterally: Yes Pulse Check Posterior Tibialis and Dorsalis pulse intact bilaterally: Yes See comments: Yes Comments Cannot palpate right posterior tibialis pulse      ASSESSMENT:  Diabetes type 2, insulin requiring  See history of present illness for detailed discussion of his current management, blood sugar patterns and problems identified  His A1c is 7.1 and significantly improved compared to earlier this year  He is doing better with retiring, improved diet and likely more consistent activity and pain  attention to his pump management Also however he is taking more insulin for his boluses at dinnertime at least Most of his hyperglycemia is after breakfast when he eats a high carbohydrate meal and only occasionally after lunch Likely needs less insulin to cover most of his evening meals Also can avoid low sugars overnight with reduce basal rate between 2-4 AM from his CGM patterns above  HYPERTENSION: Appears somewhat better  NEPHROPATHY: He previously had normal urine microalbumin and now this is reported as 3026 mg from PCP office Will likely need to be back on ACE inhibitor and will defer management to his specialists Note will be sent to his cardiologist   PLAN: Basal rate 2.2 between 2-4 AM Reduce coverage for dinner to 16 instead of 18 units for average meals sizes Make sure he boluses 10 to 15-minute before eating He will need to add additional carbohydrate coverage for breakfast meals like oatmeal and pancakes and likely will need 3 to 5 minutes more Also, with restaurant and high-fat meals with additional 2 to  4 units Encouraged him to be regularly walking or other exercise daily  Management of nephropathy as above  Patient Instructions  For oatmeal add another 3 units and pancakes extra 5 units  Dinner avg bolus 16 units  Basal at 2 am is 2.2 units        Elayne Snare 08/19/2019, 2:52 PM   Note: This office note was prepared with Dragon voice recognition system technology. Any transcriptional errors that result from this process are unintentional.

## 2019-08-19 ENCOUNTER — Encounter: Payer: Self-pay | Admitting: Endocrinology

## 2019-08-19 ENCOUNTER — Other Ambulatory Visit: Payer: Self-pay

## 2019-08-19 ENCOUNTER — Ambulatory Visit (INDEPENDENT_AMBULATORY_CARE_PROVIDER_SITE_OTHER): Payer: Medicare Other | Admitting: Endocrinology

## 2019-08-19 VITALS — BP 144/66 | HR 60 | Ht 66.0 in | Wt 197.8 lb

## 2019-08-19 DIAGNOSIS — I2510 Atherosclerotic heart disease of native coronary artery without angina pectoris: Secondary | ICD-10-CM | POA: Diagnosis not present

## 2019-08-19 DIAGNOSIS — Z794 Long term (current) use of insulin: Secondary | ICD-10-CM

## 2019-08-19 DIAGNOSIS — I10 Essential (primary) hypertension: Secondary | ICD-10-CM | POA: Diagnosis not present

## 2019-08-19 DIAGNOSIS — E1121 Type 2 diabetes mellitus with diabetic nephropathy: Secondary | ICD-10-CM

## 2019-08-19 DIAGNOSIS — E1165 Type 2 diabetes mellitus with hyperglycemia: Secondary | ICD-10-CM | POA: Diagnosis not present

## 2019-08-19 NOTE — Patient Instructions (Signed)
For oatmeal add another 3 units and pancakes extra 5 units  Dinner avg bolus 16 units  Basal at 2 am is 2.2 units

## 2019-09-01 ENCOUNTER — Telehealth: Payer: Self-pay | Admitting: Nutrition

## 2019-09-01 NOTE — Telephone Encounter (Signed)
Patient reports that his pods are costing over $200.00/month.  He can not afford this and wants to know if there is another pump company that will cost less. I phoned [employer], and left him the message about this patient.  Bret phoned me and told me that he had changed plans since starting on this, and if he changes his plan to [place], [facility] spring, or [employer], the pods will be only 40$ /month. [employer] also brought by some pods to get him through the end of the year. Patient was told the above, and he will call me back with his decision

## 2019-09-06 DIAGNOSIS — Z79899 Other long term (current) drug therapy: Secondary | ICD-10-CM | POA: Diagnosis not present

## 2019-09-06 DIAGNOSIS — M0609 Rheumatoid arthritis without rheumatoid factor, multiple sites: Secondary | ICD-10-CM | POA: Insufficient documentation

## 2019-09-13 DIAGNOSIS — Z1159 Encounter for screening for other viral diseases: Secondary | ICD-10-CM | POA: Diagnosis not present

## 2019-10-13 ENCOUNTER — Telehealth: Payer: Self-pay | Admitting: Nutrition

## 2019-10-13 NOTE — Telephone Encounter (Signed)
Patient says he

## 2019-10-14 ENCOUNTER — Telehealth: Payer: Self-pay

## 2019-10-14 ENCOUNTER — Other Ambulatory Visit: Payer: Self-pay | Admitting: Endocrinology

## 2019-10-14 DIAGNOSIS — E1165 Type 2 diabetes mellitus with hyperglycemia: Secondary | ICD-10-CM

## 2019-10-14 NOTE — Telephone Encounter (Signed)
Ordered.  I presume he is continuing OmniPod?

## 2019-10-14 NOTE — Telephone Encounter (Signed)
Pt is in need of a C-peptide and concurrent fasting glucose labs. Would you please order?

## 2019-10-14 NOTE — Telephone Encounter (Signed)
Per L.Spagnola,RN,CDE the pt stated to her on 10/13/2019 that the Omnipod was becoming too expensive and he has already checked into the [employer] pump and this is reportedly something he can afford. He is wanting to change to this, and this office has already received the paperwork to get this started if you would like to do this.

## 2019-10-14 NOTE — Telephone Encounter (Signed)
Okay to switch.  Presumably he can get coverage for the Dexcom also

## 2019-10-14 NOTE — Telephone Encounter (Signed)
Called pt and scheduled him for blood work on 10/18/2019. Pt was explained what blood work was for and why it was needed and he verbalized understanding of this.

## 2019-10-18 ENCOUNTER — Other Ambulatory Visit (INDEPENDENT_AMBULATORY_CARE_PROVIDER_SITE_OTHER): Payer: Medicare Other

## 2019-10-18 ENCOUNTER — Other Ambulatory Visit: Payer: Self-pay

## 2019-10-18 DIAGNOSIS — E1121 Type 2 diabetes mellitus with diabetic nephropathy: Secondary | ICD-10-CM | POA: Diagnosis not present

## 2019-10-18 DIAGNOSIS — Z794 Long term (current) use of insulin: Secondary | ICD-10-CM | POA: Diagnosis not present

## 2019-10-18 DIAGNOSIS — E1165 Type 2 diabetes mellitus with hyperglycemia: Secondary | ICD-10-CM | POA: Diagnosis not present

## 2019-10-18 LAB — GLUCOSE, RANDOM: Glucose, Bld: 112 mg/dL — ABNORMAL HIGH (ref 70–99)

## 2019-10-19 LAB — C-PEPTIDE: C-Peptide: 1.4 ng/mL (ref 1.1–4.4)

## 2019-10-21 ENCOUNTER — Other Ambulatory Visit: Payer: Self-pay | Admitting: Endocrinology

## 2019-10-21 DIAGNOSIS — E1121 Type 2 diabetes mellitus with diabetic nephropathy: Secondary | ICD-10-CM

## 2019-10-22 ENCOUNTER — Other Ambulatory Visit: Payer: Self-pay

## 2019-10-22 LAB — SPECIMEN STATUS REPORT

## 2019-10-26 DIAGNOSIS — Z7189 Other specified counseling: Secondary | ICD-10-CM | POA: Insufficient documentation

## 2019-10-26 LAB — CREATININE, SERUM
Creatinine, Ser: 1.48 mg/dL — ABNORMAL HIGH (ref 0.76–1.27)
GFR calc Af Amer: 54 mL/min/{1.73_m2} — ABNORMAL LOW (ref >59)
GFR calc non Af Amer: 47 mL/min/{1.73_m2} — ABNORMAL LOW (ref >59)

## 2019-10-26 LAB — SPECIMEN STATUS REPORT

## 2019-10-26 NOTE — Progress Notes (Signed)
Cardiology Office Note   Date:  10/28/2019   ID:  Alec [name], DOB [birth year], MRN [id number]  PCP:  Mayra Neer, MD  Cardiologist:   Minus Breeding, MD   Chief Complaint  Patient presents with  . Coronary Artery Disease      History of Present Illness: Alec [name] is a 74 y.o. male who presents for follow up of CAD status post CABG.  Since I last saw him he has done OK.  The patient denies any new symptoms such as chest discomfort, neck or arm discomfort. There has been no new shortness of breath, PND or orthopnea. There have been no reported palpitations, presyncope or syncope.  He rides a bike and does yard work without symptoms.    Past Medical History:  Diagnosis Date  . Anxiety   . Arthritis    "maybe in my hands" (07/15/2018)  . Bladder cancer ([place]) 2009  . CKD (chronic kidney disease), stage III   . Coronary artery disease   . Degenerative arthritis of shoulder region 08/2013   left  . Disorder of bursae and tendons in left shoulder region 08/2013  . Gout    "on daily RX" (07/15/2018)  . History of kidney stones   . Hyperlipidemia   . Hypertension    under control with meds., has been on med. > 10 yr.  . IDDM (insulin dependent diabetes mellitus)     Past Surgical History:  Procedure Laterality Date  . CARDIAC CATHETERIZATION  07/15/2018  . CHOLECYSTECTOMY  08/29/2012   Procedure: LAPAROSCOPIC CHOLECYSTECTOMY WITH INTRAOPERATIVE CHOLANGIOGRAM;  Surgeon: Imogene Burn. Tsuei, MD;  [facility];  Service: General;  Laterality: N/A;  . CORONARY ARTERY BYPASS GRAFT N/A 07/20/2018   Procedure: CORONARY ARTERY BYPASS GRAFTING (CABG) times three using left internal mammary artery to the LAD, and using endoscopically harvested right saphenous vein to PDA and intermedius.;  Surgeon: Grace Isaac, MD;  Location: [facility];  Service: Open Heart Surgery;  Laterality: N/A;  . LEFT HEART CATH AND CORONARY ANGIOGRAPHY N/A 07/15/2018   Procedure: LEFT HEART  CATH AND CORONARY ANGIOGRAPHY;  Surgeon: Leonie Man, MD;  Location: [facility];  Service: Cardiovascular;  Laterality: N/A;  . SHOULDER ARTHROSCOPY W/ ROTATOR CUFF REPAIR Right 2018  . SHOULDER ARTHROSCOPY WITH SUBACROMIAL DECOMPRESSION, ROTATOR CUFF REPAIR AND BICEP TENDON REPAIR Left 09/03/2013   Procedure: LEFT SHOULDER ARTHROSCOPY WITH EXTENSIVE DEBRIDMENT, DISTAL CLAVICULECTOMY, ROTATOR CUFF REPAIR AND SUBACROMIAL DECOMPRESSION PARTIAL ACRIOMIOPLASTY WITH CORACOACROMIAL RELEASE;  Surgeon: Renette Butters, MD;  Location: [facility];  Service: Orthopedics;  Laterality: Left;  . TEE WITHOUT CARDIOVERSION N/A 07/20/2018   Procedure: TRANSESOPHAGEAL ECHOCARDIOGRAM (TEE);  Surgeon: Grace Isaac, MD;  Location: [facility];  Service: Open Heart Surgery;  Laterality: N/A;  . TRANSURETHRAL RESECTION OF BLADDER TUMOR WITH MITOMYCIN-C  08/24/2008     Current Outpatient Medications  Medication Sig Dispense Refill  . allopurinol (ZYLOPRIM) 100 MG tablet Take 200 mg by mouth daily after supper.    Marland Kitchen amLODipine (NORVASC) 10 MG tablet Take 1 tablet (10 mg total) by mouth daily. 90 tablet 3  . aspirin EC 81 MG tablet Take 81 mg by mouth daily.    Marland Kitchen atorvastatin (LIPITOR) 40 MG tablet Take 1 tablet (40 mg total) by mouth daily at 6 PM. 90 tablet 3  . buPROPion (WELLBUTRIN XL) 150 MG 24 hr tablet Take 150 mg by mouth daily.    . Cholecalciferol (VITAMIN D) 2000 units  tablet Take 2,000 Units by mouth daily after supper.    . folic acid (FOLVITE) 1 MG tablet Take 1 mg by mouth daily.    . Glucosamine-Chondroitin (OSTEO BI-FLEX REGULAR STRENGTH PO) Take 1 tablet by mouth daily after supper.    Marland Kitchen glucose blood (FREESTYLE LITE) test strip USE AS INSTRUCTED TO TEST BLOOD SUGARS 3 TIMES DAILY. DX CODE-E11.65 (PAY FOR 100 IN 90 DAYS) 100 strip 3  . hydrALAZINE (APRESOLINE) 50 MG tablet Take 1 tablet (50 mg total) by mouth 3 (three) times daily. 270 tablet 3  . insulin aspart (NOVOLOG)  100 UNIT/ML injection INJECT 85 UNITS SUBCUTANEOUS ONCE DAILY WITH INSULIN PUMP (Patient taking differently: INJECT 85 UNITS SUBCUTANEOUS EVERY TWO DAYS WITH INSULIN PUMP) 70 mL 3  . Insulin Disposable Pump (OMNIPOD 5 PACK) MISC Place 1 each onto the skin as directed. Place 1 onto skin as directed every 48 hours 15 each 11  . methotrexate (RHEUMATREX) 2.5 MG tablet Take 2.5 mg by mouth once a week. Pt take 8 tablets once a week    . metoprolol tartrate (LOPRESSOR) 50 MG tablet Take 1 tablet (50 mg total) by mouth 2 (two) times daily. 90 tablet 3  . Misc Natural Products (TART CHERRY ADVANCED PO) Take by mouth.    . Multiple Vitamins-Minerals (ADVANCED DIABETIC MULTIVITAMIN) TABS Take 2 tablets by mouth daily after supper.    . sertraline (ZOLOFT) 50 MG tablet Take 25 mg by mouth daily.     . traZODone (DESYREL) 50 MG tablet TAKE 1 TABLET BY MOUTH EVERY DAY AT BEDTIME AS NEEDED    . cloNIDine (CATAPRES) 0.1 MG tablet Take 1 tablet (0.1 mg total) by mouth once for 1 dose. 180 tablet 3  . nitroGLYCERIN (NITROSTAT) 0.4 MG SL tablet Place 1 tablet (0.4 mg total) under the tongue every 5 (five) minutes as needed for chest pain. (Patient not taking: Reported on 06/24/2019) 30 tablet 1   No current facility-administered medications for this visit.    Allergies:   Patient has no known allergies.    ROS:  Please see the history of present illness.   Otherwise, review of systems are positive for none.   All other systems are reviewed and negative.    PHYSICAL EXAM: VS:  BP (!) 162/60   Pulse 61   Temp 98.4 F (36.9 C)   Ht 5\' 6"  (1.676 m)   Wt 200 lb 3.2 oz (90.8 kg)   SpO2 96%   BMI 32.31 kg/m  , BMI Body mass index is 32.31 kg/m. GENERAL:  Well appearing NECK:  No jugular venous distention, waveform within normal limits, carotid upstroke brisk and symmetric, no bruits, no thyromegaly LUNGS:  Clear to auscultation bilaterally CHEST:  Unremarkable HEART:  PMI not displaced or sustained,S1 and  S2 within normal limits, no S3, no S4, no clicks, no rubs, no murmurs ABD:  Flat, positive bowel sounds normal in frequency in pitch, no bruits, no rebound, no guarding, no midline pulsatile mass, no hepatomegaly, no splenomegaly EXT:  2 plus pulses throughout, no edema, no cyanosis no clubbing    EKG:  EKG is ordered today. The ekg ordered today demonstrates sinus rhythm, rate 89, axis within normal, first-degree AV block, both voltage in the limb leads, no acute ST-T wave changes.   Recent Labs: 04/19/2019: ALT 30; BUN 23; Potassium 4.2; Sodium 140 06/16/2019: TSH 3.55 10/18/2019: Creatinine, Ser 1.48    Lipid Panel    Component Value Date/Time   CHOL 104 06/16/2019  0000   TRIG 106 06/16/2019 0000   HDL 70 06/16/2019 0000   CHOLHDL 3.8 07/16/2018 0731   VLDL 24 07/16/2018 0731   LDLCALC 49 06/16/2019 0000      Wt Readings from Last 3 Encounters:  10/28/19 200 lb 3.2 oz (90.8 kg)  08/19/19 197 lb 12.8 oz (89.7 kg)  08/17/19 178 lb (80.7 kg)      Other studies Reviewed: Additional studies/ records that were reviewed today include: Labs. Review of the above records demonstrates:  Please see elsewhere in the note.     ASSESSMENT AND PLAN:  CAD:    The patient has no new sypmtoms.  No further cardiovascular testing is indicated.  We will continue with aggressive risk reduction and meds as listed.  HTN:  The blood pressure is not at target.  I am getting increase his metoprolol to 50 mg twice daily.   HYPERLIPIDEMIA:   LDL was 49 and HDL 70.  No change in therapy.  DM:   A1C was 7.1.  No change in therapy.   CKD III:   Creat is 1.48 which is stable compared to a previous of 1.44.   COVID EDUCATION: We talked about the vaccine and he thinks he would get it after this conversation.      Current medicines are reviewed at length with the patient today.  The patient does not have concerns regarding medicines.  The following changes have been made:  As above  Labs/  tests ordered today include: None  Orders Placed This Encounter  Procedures  . EKG 12-Lead     Disposition:   FU with me in six months.     Signed, Minus Breeding, MD  10/28/2019 8:48 AM    [facility]

## 2019-10-27 ENCOUNTER — Other Ambulatory Visit: Payer: Self-pay | Admitting: Endocrinology

## 2019-10-28 ENCOUNTER — Ambulatory Visit (INDEPENDENT_AMBULATORY_CARE_PROVIDER_SITE_OTHER): Payer: Medicare Other | Admitting: Cardiology

## 2019-10-28 ENCOUNTER — Encounter: Payer: Self-pay | Admitting: Cardiology

## 2019-10-28 ENCOUNTER — Telehealth: Payer: Self-pay | Admitting: Cardiology

## 2019-10-28 ENCOUNTER — Other Ambulatory Visit: Payer: Self-pay

## 2019-10-28 ENCOUNTER — Other Ambulatory Visit: Payer: Self-pay | Admitting: Endocrinology

## 2019-10-28 VITALS — BP 162/60 | HR 61 | Temp 98.4°F | Ht 66.0 in | Wt 200.2 lb

## 2019-10-28 DIAGNOSIS — E118 Type 2 diabetes mellitus with unspecified complications: Secondary | ICD-10-CM

## 2019-10-28 DIAGNOSIS — I10 Essential (primary) hypertension: Secondary | ICD-10-CM

## 2019-10-28 DIAGNOSIS — N1831 Chronic kidney disease, stage 3a: Secondary | ICD-10-CM

## 2019-10-28 DIAGNOSIS — E785 Hyperlipidemia, unspecified: Secondary | ICD-10-CM

## 2019-10-28 DIAGNOSIS — Z7189 Other specified counseling: Secondary | ICD-10-CM | POA: Diagnosis not present

## 2019-10-28 DIAGNOSIS — I2510 Atherosclerotic heart disease of native coronary artery without angina pectoris: Secondary | ICD-10-CM

## 2019-10-28 MED ORDER — METOPROLOL TARTRATE 50 MG PO TABS: 50.0000 mg | ORAL_TABLET | Freq: Two times a day (BID) | ORAL | 3 refills | Status: DC

## 2019-10-28 MED ORDER — FOLIC ACID 1 MG PO TABS: 1.0000 mg | ORAL_TABLET | Freq: Every day | ORAL | 3 refills | Status: DC

## 2019-10-28 NOTE — Telephone Encounter (Signed)
Patient calling stating he was told to call with a list of his medications:  methotrexate (RHEUMATREX) 2.5 MG tablet 6 tablets once a week  hydrALAZINE (APRESOLINE) 50 MG tablet 1 tablet 3x day allopurinol (ZYLOPRIM) 100 MG tablet 2 tablets once a day amLODipine (NORVASC) 10 MG tablet 1 tablet once a day atorvastatin (LIPITOR) 40 MG tablet 1 tablet once a day cloNIDine (CATAPRES) 0.1 MG tablet 1 tablet once a day folic acid (FOLVITE) 1 MG tablet 1 tablet once a day sertraline (ZOLOFT) 50 MG tablet 1 tablet once a day metoprolol tartrate (LOPRESSOR) 50 MG tablet 1 tablet twice a day  buPROPion (WELLBUTRIN XL) 150 MG 24 hr tablet not taking   Patient is unsure of what the buPROPion medication is and would like to know if he needs to take it and if so why. He also says he was told to get a BP machine and would like to know what type to get.

## 2019-10-28 NOTE — Patient Instructions (Signed)
Medication Instructions:  INCREASE METOPROLOL TO 50MG  TWICE A DAY *If you need a refill on your cardiac medications before your next appointment, please call your pharmacy*  Lab Work: None  Testing/Procedures: None  Follow-Up: At [facility], you and your health needs are our priority.  As part of our continuing mission to provide you with exceptional heart care, we have created designated Provider Care Teams.  These Care Teams include your primary Cardiologist (physician) and Advanced Practice Providers (APPs -  Physician Assistants and Nurse Practitioners) who all work together to provide you with the care you need, when you need it.  Your next appointment:   6 month(s)  You will receive a reminder letter in the mail two months in advance. If you don't receive a letter, please call our office to schedule the follow-up appointment.   The format for your next appointment:   In Person  Provider:   Minus Breeding, MD

## 2019-10-28 NOTE — Telephone Encounter (Signed)
Called patient back- discussed the medication question- and also where to find his BP machine he needed.  Will route to MD's nurse to make her aware of the  medication list provided.

## 2019-10-28 NOTE — Addendum Note (Signed)
Addended by: Sherrie Mustache on: 10/28/2019 10:36 AM   Modules accepted: Orders

## 2019-11-02 ENCOUNTER — Telehealth: Payer: Self-pay

## 2019-11-02 NOTE — Telephone Encounter (Signed)
Last office note was faxed, but request never stated that it needed to be signed or dated by MD. Diagnosis code is already in the chart notes.  Will fax this again.

## 2019-11-02 NOTE — Telephone Encounter (Signed)
Chart notes placed with other paperwork for MD to sign.

## 2019-11-02 NOTE — Telephone Encounter (Signed)
Lloyd from [employer] is calling to see if last chart note has been faxed-must have [place] also has to be dated-please fax to [phone]

## 2019-11-04 ENCOUNTER — Telehealth: Payer: Self-pay

## 2019-11-04 NOTE — Telephone Encounter (Signed)
[facility] called and they are requesting the office notes to support the CMN that they received from [place] on 11/01/2019 for the insulin pump.  Phone: [phone]

## 2019-11-04 NOTE — Telephone Encounter (Signed)
Chart notes printed and faxed to [facility] per their request.

## 2019-11-09 DIAGNOSIS — M0609 Rheumatoid arthritis without rheumatoid factor, multiple sites: Secondary | ICD-10-CM | POA: Diagnosis not present

## 2019-11-16 ENCOUNTER — Telehealth: Payer: Self-pay | Admitting: Nutrition

## 2019-11-16 NOTE — Telephone Encounter (Signed)
Corrected lab C-peptide and creatinine clearance sent to [facility]

## 2019-11-16 NOTE — Telephone Encounter (Signed)
Medicare criteria for creatinine clearance under 50 indicates C-peptide can be up to 2.2.  May need to fax results of the creatinine/GFR report also more

## 2019-11-16 NOTE — Telephone Encounter (Signed)
[name] was 1.4 .   [facility] told him to have a GAD test.  He is coming in for blood work on Thursday,  do you think he should have this?  His insurance is denying the pump.

## 2019-11-17 ENCOUNTER — Other Ambulatory Visit: Payer: Self-pay

## 2019-11-17 ENCOUNTER — Other Ambulatory Visit (INDEPENDENT_AMBULATORY_CARE_PROVIDER_SITE_OTHER): Payer: Medicare Other

## 2019-11-17 DIAGNOSIS — Z794 Long term (current) use of insulin: Secondary | ICD-10-CM

## 2019-11-17 DIAGNOSIS — E1121 Type 2 diabetes mellitus with diabetic nephropathy: Secondary | ICD-10-CM | POA: Diagnosis not present

## 2019-11-17 DIAGNOSIS — E1165 Type 2 diabetes mellitus with hyperglycemia: Secondary | ICD-10-CM

## 2019-11-17 LAB — BASIC METABOLIC PANEL
BUN: 23 mg/dL (ref 6–23)
CO2: 28 mEq/L (ref 19–32)
Calcium: 9.7 mg/dL (ref 8.4–10.5)
Chloride: 106 mEq/L (ref 96–112)
Creatinine, Ser: 1.51 mg/dL — ABNORMAL HIGH (ref 0.40–1.50)
GFR: 45.74 mL/min — ABNORMAL LOW (ref >60.00)
Glucose, Bld: 167 mg/dL — ABNORMAL HIGH (ref 70–99)
Potassium: 4.1 mEq/L (ref 3.5–5.1)
Sodium: 139 mEq/L (ref 135–145)

## 2019-11-17 LAB — HEMOGLOBIN A1C: Hgb A1c MFr Bld: 7.3 % — ABNORMAL HIGH (ref 4.6–6.5)

## 2019-11-18 ENCOUNTER — Other Ambulatory Visit: Payer: Medicare Other

## 2019-11-18 LAB — CREATININE, SERUM
Creatinine, Ser: 1.58 mg/dL — ABNORMAL HIGH (ref 0.76–1.27)
GFR calc Af Amer: 50 mL/min/{1.73_m2} — ABNORMAL LOW (ref >59)
GFR calc non Af Amer: 43 mL/min/{1.73_m2} — ABNORMAL LOW (ref >59)

## 2019-11-20 ENCOUNTER — Ambulatory Visit: Payer: Medicare Other | Attending: Internal Medicine

## 2019-11-20 DIAGNOSIS — Z23 Encounter for immunization: Secondary | ICD-10-CM | POA: Insufficient documentation

## 2019-11-20 NOTE — Progress Notes (Signed)
   Covid-19 Vaccination Clinic  Name:  OPAL MCKELLIPS    MRN: [id number] DOB: [birth year]  11/20/2019  Mr. Budney was observed post Covid-19 immunization for 15 minutes without incidence. He was provided with Vaccine Information Sheet and instruction to access the V-Safe system.   Mr. Dutton was instructed to call 911 with any severe reactions post vaccine: Marland Kitchen Difficulty breathing  . Swelling of your face and throat  . A fast heartbeat  . A bad rash all over your body  . Dizziness and weakness    Immunizations Administered    Name Date Dose VIS Date Route   Pfizer COVID-19 Vaccine 11/20/2019 12:07 PM 0.3 mL 09/10/2019 Intramuscular   Manufacturer: [place]   Lot: [id number]   [place]: [id number]

## 2019-11-23 ENCOUNTER — Encounter: Payer: Self-pay | Admitting: Endocrinology

## 2019-11-23 ENCOUNTER — Ambulatory Visit (INDEPENDENT_AMBULATORY_CARE_PROVIDER_SITE_OTHER): Payer: Medicare Other | Admitting: Endocrinology

## 2019-11-23 ENCOUNTER — Other Ambulatory Visit: Payer: Self-pay

## 2019-11-23 VITALS — BP 150/80 | HR 70 | Ht 66.0 in | Wt 200.2 lb

## 2019-11-23 DIAGNOSIS — I10 Essential (primary) hypertension: Secondary | ICD-10-CM | POA: Diagnosis not present

## 2019-11-23 DIAGNOSIS — E1121 Type 2 diabetes mellitus with diabetic nephropathy: Secondary | ICD-10-CM | POA: Diagnosis not present

## 2019-11-23 DIAGNOSIS — E1165 Type 2 diabetes mellitus with hyperglycemia: Secondary | ICD-10-CM | POA: Diagnosis not present

## 2019-11-23 DIAGNOSIS — I2510 Atherosclerotic heart disease of native coronary artery without angina pectoris: Secondary | ICD-10-CM

## 2019-11-23 DIAGNOSIS — Z794 Long term (current) use of insulin: Secondary | ICD-10-CM | POA: Diagnosis not present

## 2019-11-23 MED ORDER — OMNIPOD CLASSIC PODS (GEN 3) MISC: 1.0000 | 0 refills | Status: DC

## 2019-11-23 NOTE — Progress Notes (Signed)
Patient ID: Alec [name], male   DOB: January 21, 1946, 74 y.o.   MRN: [id number]             Reason for Appointment:  Follow-up for Type 2 Diabetes    History of Present Illness:          Diagnosis: Type 2 diabetes mellitus, date of diagnosis: 2001       Prior history: He was seen in consultation in 11/2014 when his A1c was 7.4 Since his blood sugars were averaging over 200 he was given Victoza in addition to his basal bolus insulin regimen in 6/16 He started on the V go pump in October 2016  Recent history:    INSULIN regimen is described as:  BASAL rate on Omnipod pump:  Midnight = 2.2, 4 AM 2.2, 6 PM-10 PM = 2.6 and 10 PM 2.4  Boluses 12-14 at breakfast, 12 units lunch and 18 at dinner  Non-insulin hypoglycemic drugs the patient is taking are: Victoza 1.2 mg daily   His A1c is about the same at 7.3, was 7.1  Current blood sugar patterns from analysis of his meter download, daily management and problems identified:  He is complaining about the cost of his OmniPod pump which is not covered adequately  However he likely will be approved for the T-insulin pump with his C-peptide meeting the criteria  Blood sugar patterns are discussed below  He appears to be having higher readings at suppertime frequently but is more of a gradual increase in blood sugars after about 4 PM  Unable to get data from his pump for the last 2 weeks on his download  He is not adjusting his mealtime bolus based on what he is eating and in the morning his blood sugars are quite variable postprandially depending on whether he is having more carbohydrate or more protein  She has started walking when the weather is good and if he is walking up to 2 miles may declining after 3 AM seeing blood sugar again low also  Not blood sugars being checked at bedtime  Hypoglycemia as discussed below and occasionally this is related to boluses and rarely early morning  Side effects from medications have been:none   Glucose monitoring:  Freestyle libre   CONTINUOUS GLUCOSE MONITORING RECORD INTERPRETATION    Dates of Recording:   Sensor description:  Results statistics:   CGM use % of time  77  Average and SD  156, GV 37  Time in range     66   %  % Time Above 180  23  % Time above 250  8  % Time Below target  3    PRE-MEAL Fasting Lunch Dinner Bedtime Overall  Glucose range:       Mean/median:  117   191  190    POST-MEAL PC Breakfast PC Lunch PC Dinner  Glucose range:     Mean/median:  161  174  209    Glycemic patterns summary: His lab blood sugar is close to the freestyle libre readings around the same time Blood sugars show significant hyperglycemia in the afternoons and in the evenings and improvement overnight Significant variability present.  Hypoglycemia has been minimal and mostly early afternoon  Hyperglycemic episodes are occurring periodically midday, sometimes in the afternoons and late evening  Hypoglycemic episodes occurred infrequently couple of low readings in the early afternoon and once or twice every morning  Overnight periods: Blood sugars are modestly higher at midnight and start declining after  3 AM with lowest average 117 between 6-8 AM and only once low normal  Preprandial periods: Blood sugars are excellent before breakfast, however appears the progressively higher at lunch and dinnertime with some variability with highest readings before dinner averaging 190  Postprandial periods:   After breakfast: Blood sugars are variable and occasionally will go higher with certain foods but occasionally will also have low normal or low readings also  After lunch:   Blood sugars are usually well controlled without excessive rise but on occasion will be low normal or low also  After dinner: On average blood sugars are similar before and after meals but has only inadequate information since he does not monitor at bedtime frequently   Previous data:  CGM use %  of time  90  Average and SD  136, GV 38  Time in range    76    %  % Time Above 180  15  % Time above 250  3  % Time Below target  6    PRE-MEAL Fasting Lunch Dinner Bedtime Overall  Glucose range:       Mean/median:  100  167  154  127  136   POST-MEAL PC Breakfast PC Lunch PC Dinner  Glucose range:     Mean/median:  177  160  128     Self-care:  Meals: 3 meals per day. Lunch  1 pm Dinner 6 pm Breakfast is usually an egg sandwich but sometimes biscuits   Eating balanced meals in the evening.  He will have snacks with granola bars              Dietician visit, most recent: 6/15.               Weight history:  Wt Readings from Last 3 Encounters:  11/23/19 200 lb 3.2 oz (90.8 kg)  10/28/19 200 lb 3.2 oz (90.8 kg)  08/19/19 197 lb 12.8 oz (89.7 kg)    Glycemic control:   Lab Results  Component Value Date   HGBA1C 7.3 (H) 11/17/2019   HGBA1C 7.2 (H) 04/19/2019   HGBA1C 8.5 (H) 01/19/2019   Lab Results  Component Value Date   MICROALBUR 5.9 (H) 06/16/2018   LDLCALC 49 06/16/2019   CREATININE 1.58 (H) 11/17/2019   CREATININE 1.51 (H) 11/17/2019     Past history:  He was not symptomatic at time of diagnosis and was probably treated with Amaryl only.  No details are available of previous management However he thinks he was not given metformin at any time because of his "kidney problem" He had a A1c of over 9% in 2013 and may have been started on insulin at that time. Most likely has been taking Levemir and NovoLog since then A1c records show his results were over 8% in 2014 and in 4/15 but subsequently down to 7.1 in November 2015 Amaryl was stopped in 7/16  Other active problems are addressed in Review of systems   Allergies as of 11/23/2019   No Known Allergies     Medication List       Accurate as of November 23, 2019  8:58 AM. If you have any questions, ask your nurse or doctor.        allopurinol 100 MG tablet Commonly known as: ZYLOPRIM Take 200  mg by mouth daily after supper.   amLODipine 10 MG tablet Commonly known as: NORVASC Take 1 tablet (10 mg total) by mouth daily.   aspirin EC 81 MG  tablet Take 81 mg by mouth daily.   atorvastatin 40 MG tablet Commonly known as: LIPITOR Take 1 tablet (40 mg total) by mouth daily at 6 PM.   buPROPion 150 MG 24 hr tablet Commonly known as: WELLBUTRIN XL Take 150 mg by mouth daily.   cloNIDine 0.1 MG tablet Commonly known as: CATAPRES Take 1 tablet (0.1 mg total) by mouth once for 1 dose.   folic acid 1 MG tablet Commonly known as: FOLVITE Take 1 tablet (1 mg total) by mouth daily.   FREESTYLE LITE test strip Generic drug: glucose blood USE AS INSTRUCTED TO TEST BLOOD SUGARS 3 TIMES DAILY. DX CODE-E11.65 (PAY FOR 100 IN 90 DAYS)   hydrALAZINE 50 MG tablet Commonly known as: APRESOLINE Take 1 tablet (50 mg total) by mouth 3 (three) times daily.   insulin aspart 100 UNIT/ML injection Commonly known as: NovoLOG INJECT 85 UNITS SUBCUTANEOUSLY ONCE DAILY WITH INSULIN PUMP   methotrexate 2.5 MG tablet Commonly known as: RHEUMATREX Take 2.5 mg by mouth once a week. Pt take 6 tablets once a week   metoprolol tartrate 50 MG tablet Commonly known as: LOPRESSOR Take 1 tablet (50 mg total) by mouth 2 (two) times daily.   nitroGLYCERIN 0.4 MG SL tablet Commonly known as: NITROSTAT Place 1 tablet (0.4 mg total) under the tongue every 5 (five) minutes as needed for chest pain.   OmniPod 5 Pack Misc Place 1 each onto the skin as directed. Place 1 onto skin as directed every 48 hours   OSTEO BI-FLEX REGULAR STRENGTH PO Take 1 tablet by mouth daily after supper.   sertraline 50 MG tablet Commonly known as: ZOLOFT Take 25 mg by mouth daily.       Allergies: No Known Allergies  Past Medical History:  Diagnosis Date  . Anxiety   . Arthritis    "maybe in my hands" (07/15/2018)  . Bladder cancer ([place]) 2009  . CKD (chronic kidney disease), stage III   . Coronary artery  disease   . Degenerative arthritis of shoulder region 08/2013   left  . Disorder of bursae and tendons in left shoulder region 08/2013  . Gout    "on daily RX" (07/15/2018)  . History of kidney stones   . Hyperlipidemia   . Hypertension    under control with meds., has been on med. > 10 yr.  . IDDM (insulin dependent diabetes mellitus)     Past Surgical History:  Procedure Laterality Date  . CARDIAC CATHETERIZATION  07/15/2018  . CHOLECYSTECTOMY  08/29/2012   Procedure: LAPAROSCOPIC CHOLECYSTECTOMY WITH INTRAOPERATIVE CHOLANGIOGRAM;  Surgeon: Imogene Burn. Tsuei, MD;  Location: [facility];  Service: General;  Laterality: N/A;  . CORONARY ARTERY BYPASS GRAFT N/A 07/20/2018   Procedure: CORONARY ARTERY BYPASS GRAFTING (CABG) times three using left internal mammary artery to the LAD, and using endoscopically harvested right saphenous vein to PDA and intermedius.;  Surgeon: Grace Isaac, MD;  Location: [place];  Service: Open Heart Surgery;  Laterality: N/A;  . LEFT HEART CATH AND CORONARY ANGIOGRAPHY N/A 07/15/2018   Procedure: LEFT HEART CATH AND CORONARY ANGIOGRAPHY;  Surgeon: Leonie Man, MD;  Location: [place] [facility];  Service: Cardiovascular;  Laterality: N/A;  . SHOULDER ARTHROSCOPY W/ ROTATOR CUFF REPAIR Right 2018  . SHOULDER ARTHROSCOPY WITH SUBACROMIAL DECOMPRESSION, ROTATOR CUFF REPAIR AND BICEP TENDON REPAIR Left 09/03/2013   Procedure: LEFT SHOULDER ARTHROSCOPY WITH EXTENSIVE DEBRIDMENT, DISTAL CLAVICULECTOMY, ROTATOR CUFF REPAIR AND SUBACROMIAL DECOMPRESSION PARTIAL ACRIOMIOPLASTY WITH CORACOACROMIAL RELEASE;  Surgeon: Renette Butters, MD;  Location: [place];  Service: Orthopedics;  Laterality: Left;  . TEE WITHOUT CARDIOVERSION N/A 07/20/2018   Procedure: TRANSESOPHAGEAL ECHOCARDIOGRAM (TEE);  Surgeon: Grace Isaac, MD;  Location: [place];  Service: Open Heart Surgery;  Laterality: N/A;  . TRANSURETHRAL RESECTION OF BLADDER TUMOR WITH MITOMYCIN-C   08/24/2008    Family History  Problem Relation Age of Onset  . Diabetes Mother   . Cancer Father   . Coronary artery disease Father        MI at age 49.   . Sudden death Son   . CAD Maternal Grandfather        MI in his 34's  . CAD Paternal Grandfather     Social History:  reports that he quit smoking about 22 years ago. His smoking use included cigarettes. He has a 50.00 pack-year smoking history. He has never used smokeless tobacco. He reports previous alcohol use. He reports that he does not use drugs.    Review of Systems     HYPERTENSION: has been treated by his PCP with amlodipine 10 mg, hydralazine and clonidine Blood pressure also followed by cardiologist Still not on ACE or ARB medication Home readings usually 413K systolic       Lipids: he is taking Lipitor 40 mg from his PCP       Lab Results  Component Value Date   CHOL 104 06/16/2019   HDL 70 06/16/2019   LDLCALC 49 06/16/2019   TRIG 106 06/16/2019   CHOLHDL 3.8 07/16/2018               CKD: He has been followed by nephrologist for diabetic nephropathy.  Now being followed annually Last urine microalbumin ratio is much higher, previously had come back to normal with lisinopril  His creatinine is variable   Lab Results  Component Value Date   CREATININE 1.58 (H) 11/17/2019   CREATININE 1.51 (H) 11/17/2019   CREATININE 1.48 (H) 10/18/2019    Last foot exam in 08/2019  Has neuropathic symptoms controlled with low-dose gabapentin, also on Requip from PCP   Eye exam last:  1/19   Physical Examination:  BP (!) 150/80 (BP Location: Left Arm, Patient Position: Sitting, Cuff Size: Normal)   Pulse 70   Ht 5\' 6"  (1.676 m)   Wt 200 lb 3.2 oz (90.8 kg)   SpO2 97%   BMI 32.31 kg/m       ASSESSMENT:  Diabetes type 2, insulin requiring  See history of present illness for detailed discussion of his current management, blood sugar patterns and problems identified  His A1c is 7.3, was 7.1   Although he is still overall doing well his blood sugars are appearing to be progressively higher in the late afternoons and evenings as discussed above Still has variability in blood sugars and not adjusting his mealtime dose based on his actual carbohydrates are effectively Low blood sugars either related to over bolusing or occasionally correcting high postprandial readings  He is concerned about the cost of the OmniPod and is waiting to switch to the T-insulin pump  HYPERTENSION: Blood pressure is higher from not taking any medication but relatively better at home As before not on any ACE inhibitor or ARB despite proteinuria   PLAN: Adjust breakfast bolus based on type of meal, usually will need less insulin for higher protein breakfast like eggs and meat His bolus range in the morning will be 10 to 15 minutes Also  will adjust boluses at lunch and dinner based on amount of carbohydrates and extra 2 to 4 units for higher fat meals More blood sugar monitoring after dinner at night If he has near normal blood sugars before going for involve he can use a temporary basal of -50% and this was shown to him Basal rate changes as follows which was done in the office under supervision since he does not know how to do it himself: Basal rate between 14-10 PM = 2.8 and 10 PM += 2.5  We will continue to monitor blood sugars with the freestyle libre and when he is able to get the Dexcom will switch Paperwork will be again sent for approval for Medicare based on C-peptide criteria for renal insufficiency   Hypertension/neuropathy: We will defer management to nephrologist and cardiologist, again recommend adding an ARB drug We will check urine protein on the next visit  There are no Patient Instructions on file for this visit.       Elayne Snare 11/23/2019, 8:58 AM   Note: This office note was prepared with Dragon voice recognition system technology. Any transcriptional errors that result from  this process are unintentional.

## 2019-11-23 NOTE — Telephone Encounter (Signed)
[facility] and spoke with representative named Alec [name]. Alec [name] looked into patient's account and noted that they reportedly never received an order for the CGM or corresponding supplies. She noted that there was an order for only the T-slim insulin pump. Alec [name] then placed this order and she stated that this office would receive a fax requesting chart notes and the Rx (in the form of [place]) which is to be filled out, signed, and faxed back. She also stated that the patient has a direct contact who is in charge of coordinating with the patient as it relates to getting the pt set up with everything he needs in order to start the pump. Her name is Alec [name], and she can be reached through [employer] when needed by pt or this office. Alec [name] stated that the coordinator for the pt (Alec [name]) typically reaches out to the pt to provide updates after 7-8 business days of initiating this process, but she can be contacted sooner if needed.

## 2019-11-23 NOTE — Telephone Encounter (Signed)
Creatinine and GFR report sent to [facility]. Also attempted to call [facility] and they do not open until 0930. Will attempt again later.

## 2019-11-24 ENCOUNTER — Telehealth: Payer: Self-pay | Admitting: Endocrinology

## 2019-11-24 NOTE — Telephone Encounter (Signed)
Patient called and has questions about the [facility] prescription what was sent in for him yesterday.  Please contact at [phone]

## 2019-11-24 NOTE — Telephone Encounter (Signed)
Patient reports that he called [facility] and they have closed his claim  for his pump.  I called [facility] and spoke with Shilo R.  She said they could not read the lab work (creatine clearance) and for that reason they closed the claim.  She reopened the claim, at my insistance, and I faxed a lighter version of the lab work and sent this to [facility] and told her to please review and let us know ASAP.    There was a fax confirmation that the paperwork went through.  Patient was notified of this development.   Pods left up front for patient to pick up.

## 2019-11-24 NOTE — Telephone Encounter (Signed)
Pt. Says has not heard back about his Tandem pump. See earlier notes.  Information set per request from distributor for this.

## 2019-11-25 NOTE — Telephone Encounter (Signed)
Thank you for faxing lab work again. This office was never notified that lab work that was faxed earlier this week was unable to be read.

## 2019-12-02 NOTE — Telephone Encounter (Signed)
Received a fax from [facility] today stating that the patient referral for the T-Slim control IQ rental is ready to be shipped.  [facility] stated that "we have received PA approval and your patient's order is ready to be shipped. They will receive their equipment and supplies within 3-5 days."

## 2019-12-06 DIAGNOSIS — Z79899 Other long term (current) drug therapy: Secondary | ICD-10-CM | POA: Diagnosis not present

## 2019-12-06 DIAGNOSIS — M0609 Rheumatoid arthritis without rheumatoid factor, multiple sites: Secondary | ICD-10-CM | POA: Diagnosis not present

## 2019-12-09 DIAGNOSIS — E782 Mixed hyperlipidemia: Secondary | ICD-10-CM | POA: Diagnosis not present

## 2019-12-09 DIAGNOSIS — E1122 Type 2 diabetes mellitus with diabetic chronic kidney disease: Secondary | ICD-10-CM | POA: Diagnosis not present

## 2019-12-09 DIAGNOSIS — I129 Hypertensive chronic kidney disease with stage 1 through stage 4 chronic kidney disease, or unspecified chronic kidney disease: Secondary | ICD-10-CM | POA: Diagnosis not present

## 2019-12-09 LAB — HEMOGLOBIN A1C: Hemoglobin A1C: 6.7

## 2019-12-13 ENCOUNTER — Encounter: Payer: Medicare Other | Attending: Endocrinology | Admitting: Nutrition

## 2019-12-13 ENCOUNTER — Other Ambulatory Visit: Payer: Self-pay

## 2019-12-13 ENCOUNTER — Encounter: Payer: Medicare Other | Admitting: Nutrition

## 2019-12-13 ENCOUNTER — Ambulatory Visit: Payer: Medicare Other | Attending: Internal Medicine

## 2019-12-13 DIAGNOSIS — E1165 Type 2 diabetes mellitus with hyperglycemia: Secondary | ICD-10-CM | POA: Insufficient documentation

## 2019-12-13 DIAGNOSIS — E1121 Type 2 diabetes mellitus with diabetic nephropathy: Secondary | ICD-10-CM | POA: Insufficient documentation

## 2019-12-13 DIAGNOSIS — Z23 Encounter for immunization: Secondary | ICD-10-CM

## 2019-12-13 NOTE — Progress Notes (Signed)
   Covid-19 Vaccination Clinic  Name:  Alec [name]    MRN: [id number] DOB: 1946-03-27  12/13/2019  [name] was observed post Covid-19 immunization for 15 minutes without incident. He was provided with Vaccine Information Sheet and instruction to access the V-Safe system.   [name] was instructed to call 911 with any severe reactions post vaccine: Marland Kitchen Difficulty breathing  . Swelling of face and throat  . A fast heartbeat  . A bad rash all over body  . Dizziness and weakness   Immunizations Administered    Name Date Dose VIS Date Route   Pfizer COVID-19 Vaccine 12/13/2019  9:25 AM 0.3 mL 09/10/2019 Intramuscular   Manufacturer: [place]   Lot: [id number]   [place]: [id number]

## 2019-12-14 ENCOUNTER — Encounter: Payer: Self-pay | Admitting: Endocrinology

## 2019-12-14 ENCOUNTER — Encounter: Payer: Medicare Other | Admitting: Nutrition

## 2019-12-14 ENCOUNTER — Telehealth: Payer: Self-pay | Admitting: Nutrition

## 2019-12-14 ENCOUNTER — Ambulatory Visit (INDEPENDENT_AMBULATORY_CARE_PROVIDER_SITE_OTHER): Payer: Medicare Other | Admitting: Endocrinology

## 2019-12-14 VITALS — BP 132/86 | HR 62 | Ht 66.0 in | Wt 200.8 lb

## 2019-12-14 DIAGNOSIS — E1165 Type 2 diabetes mellitus with hyperglycemia: Secondary | ICD-10-CM

## 2019-12-14 DIAGNOSIS — Z794 Long term (current) use of insulin: Secondary | ICD-10-CM | POA: Diagnosis not present

## 2019-12-14 DIAGNOSIS — I2510 Atherosclerotic heart disease of native coronary artery without angina pectoris: Secondary | ICD-10-CM | POA: Diagnosis not present

## 2019-12-14 NOTE — Telephone Encounter (Signed)
Alec [name] reported that his blood sugars were high all afternoon.  He left here yesterday and went to eat lunch.  He believes he did not give the bolus correctly, and blood sugars remained in the 300s all afternoon.  He ate supper at 6:30, and blood sugar now (1hr. PcS)  is 257 with 15u of IOB.  He reports that the Dexcom readings linked to his pump, after the 2 hour warm up period.  We reviewed the idea of IOB, and the need to continue watching this to make sure he does not go to bed with significant IOB without eating something.  He reported good understanding of this and had no questions.

## 2019-12-14 NOTE — Patient Instructions (Addendum)
Review handouts given on how to bolus and call Tandem if questions. Call office if blood sugars remain over 250, or drop below 70 Read over pump manual

## 2019-12-14 NOTE — Progress Notes (Signed)
Alec [name] is here with his wife to learn how to use the Tandem insulin pump.  Because he was not checked in yesterday, and waited in the waiting room with his wife for one hour, before being seen, we were only able to transfer the settings into the new pump and start the sensor and link it to his pump.  Settings transferred from his OmniPod:  Basal rate:  MN: 2.4,  2AM: 2.2, 4PM:2.8, 10 PM: 2.5.  I/C: 10, ISF: 50, timing 4 hours.  Control IQ was turned on, and Dexcom was started and linked to his pump. He was shown how to fill a cartridge and he did this with Novolog insulin and started his pump at 1:15PM.  He D/C his old pump, and was shown how to give a bolus. 'He redemonstrated this correctly X2 and reported that he understood this.  He does not know how to count carbs, and since the control IQ will not work without this feature, he was told to put in the insulin amount X10 into the carb portion of his bolus wizard.  He agreed to do this and re verbalized this X2.   We will review more tomorrow after his appointment with Dr. Dwyane Dee.  I will call him tonight to see how he is doing.  They had ho final questions.

## 2019-12-14 NOTE — Progress Notes (Signed)
Patient ID: Alec [name], male   DOB: [birth year], 74 y.o.   MRN: [id number]             Reason for Appointment:  Follow-up for Type 2 Diabetes    History of Present Illness:          Diagnosis: Type 2 diabetes mellitus, date of diagnosis: 2001       Prior history: He was seen in consultation in 11/2014 when his A1c was 7.4 Since his blood sugars were averaging over 200 he was given Victoza in addition to his basal bolus insulin regimen in 6/16 He started on the V go pump in October 2016  Recent history:    INSULIN regimen is described as:  BASAL rate on T-Slim pump:  Midnight = 2.4, 2 AM 2.2, 4 PM-10 PM = 2.8 and 10 PM 2.5  Boluses 12-14 at breakfast, 12 units lunch and 18 at dinner  Non-insulin hypoglycemic drugs the patient is taking are: Victoza 1.2 mg daily   His A1c is currently 6.7 done by PCP, previously 7.3   Current blood sugar patterns from analysis of his meter download, daily management and problems identified:  He is starting the T-insulin pump because of the cost of the OmniPod  Although he was supposed to be on the control IQ setting he apparently had this active only for short time yesterday and after about 4 PM this got turned off  He had some fried food and high-fat meat at lunchtime causing his blood sugar to be over 350 after lunch despite taking 15 units bolus  Blood sugar did not come down significantly and was still high at 332 at dinnertime on his Dexcom sensor  However his blood sugar was 190 on the fingersticks at about 6:30 PM  With bolusing a total of 14 units his blood sugar gradually improved down to about 180-200 range  However blood sugar after about 12 AM was down to 68 but he appears to be that he was still getting his basal  Not clear why the basal IQ was not active at that time to prevent a low sugar  After BREAKFAST today his blood sugar went up to over 300 since he bolused only 3.4 units instead of his usual 12 units and had a  higher carbohydrate meals with pop tart  He is still not very familiar with the workings of the pump although has been able to bolus on his own  His weight is the same as before  Side effects from medications have been:none  Glucose monitoring:  Now Dexcom, blood sugars as above  Previous data:    CGM use % of time  77  Average and SD  156, GV 37  Time in range     66   %  % Time Above 180  23  % Time above 250  8  % Time Below target  3    PRE-MEAL Fasting Lunch Dinner Bedtime Overall  Glucose range:       Mean/median:  117   191  190    POST-MEAL PC Breakfast PC Lunch PC Dinner  Glucose range:     Mean/median:  161  174  209    Self-care:  Meals: 3 meals per day. Lunch  1 pm Dinner 6 pm Breakfast is usually an egg sandwich but sometimes biscuits   Eating balanced meals in the evening.  He will have snacks with granola bars  Dietician visit, most recent: 6/15.               Weight history:  Wt Readings from Last 3 Encounters:  12/14/19 200 lb 12.8 oz (91.1 kg)  11/23/19 200 lb 3.2 oz (90.8 kg)  10/28/19 200 lb 3.2 oz (90.8 kg)    Glycemic control:   Lab Results  Component Value Date   HGBA1C 7.3 (H) 11/17/2019   HGBA1C 7.2 (H) 04/19/2019   HGBA1C 8.5 (H) 01/19/2019   Lab Results  Component Value Date   MICROALBUR 5.9 (H) 06/16/2018   LDLCALC 49 06/16/2019   CREATININE 1.58 (H) 11/17/2019   CREATININE 1.51 (H) 11/17/2019     Past history:  He was not symptomatic at time of diagnosis and was probably treated with Amaryl only.  No details are available of previous management However he thinks he was not given metformin at any time because of his "kidney problem" He had a A1c of over 9% in 2013 and may have been started on insulin at that time. Most likely has been taking Levemir and NovoLog since then A1c records show his results were over 8% in 2014 and in 4/15 but subsequently down to 7.1 in November 2015 Amaryl was stopped in 7/16   Other active problems are addressed in Review of systems   Allergies as of 12/14/2019   No Known Allergies     Medication List       Accurate as of December 14, 2019 11:05 AM. If you have any questions, ask your nurse or doctor.        STOP taking these medications   OmniPod 5 Pack Misc Stopped by: Elayne Snare, MD     TAKE these medications   allopurinol 100 MG tablet Commonly known as: ZYLOPRIM Take 200 mg by mouth daily after supper.   amLODipine 10 MG tablet Commonly known as: NORVASC Take 1 tablet (10 mg total) by mouth daily.   aspirin EC 81 MG tablet Take 81 mg by mouth daily.   atorvastatin 40 MG tablet Commonly known as: LIPITOR Take 1 tablet (40 mg total) by mouth daily at 6 PM.   buPROPion 150 MG 24 hr tablet Commonly known as: WELLBUTRIN XL Take 150 mg by mouth daily.   cloNIDine 0.1 MG tablet Commonly known as: CATAPRES Take 1 tablet (0.1 mg total) by mouth once for 1 dose.   folic acid 1 MG tablet Commonly known as: FOLVITE Take 1 tablet (1 mg total) by mouth daily.   FREESTYLE LITE test strip Generic drug: glucose blood USE AS INSTRUCTED TO TEST BLOOD SUGARS 3 TIMES DAILY. DX CODE-E11.65 (PAY FOR 100 IN 90 DAYS)   hydrALAZINE 50 MG tablet Commonly known as: APRESOLINE Take 1 tablet (50 mg total) by mouth 3 (three) times daily.   insulin aspart 100 UNIT/ML injection Commonly known as: NovoLOG INJECT 85 UNITS SUBCUTANEOUSLY ONCE DAILY WITH INSULIN PUMP   methotrexate 2.5 MG tablet Commonly known as: RHEUMATREX Take 2.5 mg by mouth once a week. Pt take 6 tablets once a week   metoprolol tartrate 50 MG tablet Commonly known as: LOPRESSOR Take 1 tablet (50 mg total) by mouth 2 (two) times daily.   nitroGLYCERIN 0.4 MG SL tablet Commonly known as: NITROSTAT Place 1 tablet (0.4 mg total) under the tongue every 5 (five) minutes as needed for chest pain.   OSTEO BI-FLEX REGULAR STRENGTH PO Take 1 tablet by mouth daily after supper.    sertraline 50 MG tablet Commonly known  as: ZOLOFT Take 25 mg by mouth daily.       Allergies: No Known Allergies  Past Medical History:  Diagnosis Date  . Anxiety   . Arthritis    "maybe in my hands" (07/15/2018)  . Bladder cancer ([place]) 2009  . CKD (chronic kidney disease), stage III   . Coronary artery disease   . Degenerative arthritis of shoulder region 08/2013   left  . Disorder of bursae and tendons in left shoulder region 08/2013  . Gout    "on daily RX" (07/15/2018)  . History of kidney stones   . Hyperlipidemia   . Hypertension    under control with meds., has been on med. > 10 yr.  . IDDM (insulin dependent diabetes mellitus)     Past Surgical History:  Procedure Laterality Date  . CARDIAC CATHETERIZATION  07/15/2018  . CHOLECYSTECTOMY  08/29/2012   Procedure: LAPAROSCOPIC CHOLECYSTECTOMY WITH INTRAOPERATIVE CHOLANGIOGRAM;  Surgeon: Imogene Burn. Tsuei, MD;  Location: [facility];  Service: General;  Laterality: N/A;  . CORONARY ARTERY BYPASS GRAFT N/A 07/20/2018   Procedure: CORONARY ARTERY BYPASS GRAFTING (CABG) times three using left internal mammary artery to the LAD, and using endoscopically harvested right saphenous vein to PDA and intermedius.;  Surgeon: Grace Isaac, MD;  Location: [facility];  Service: Open Heart Surgery;  Laterality: N/A;  . LEFT HEART CATH AND CORONARY ANGIOGRAPHY N/A 07/15/2018   Procedure: LEFT HEART CATH AND CORONARY ANGIOGRAPHY;  Surgeon: Leonie Man, MD;  Location: [facility];  Service: Cardiovascular;  Laterality: N/A;  . SHOULDER ARTHROSCOPY W/ ROTATOR CUFF REPAIR Right 2018  . SHOULDER ARTHROSCOPY WITH SUBACROMIAL DECOMPRESSION, ROTATOR CUFF REPAIR AND BICEP TENDON REPAIR Left 09/03/2013   Procedure: LEFT SHOULDER ARTHROSCOPY WITH EXTENSIVE DEBRIDMENT, DISTAL CLAVICULECTOMY, ROTATOR CUFF REPAIR AND SUBACROMIAL DECOMPRESSION PARTIAL ACRIOMIOPLASTY WITH CORACOACROMIAL RELEASE;  Surgeon: Renette Butters, MD;  Location: [facility];  Service: Orthopedics;  Laterality: Left;  . TEE WITHOUT CARDIOVERSION N/A 07/20/2018   Procedure: TRANSESOPHAGEAL ECHOCARDIOGRAM (TEE);  Surgeon: Grace Isaac, MD;  Location: [facility] [place];  Service: Open Heart Surgery;  Laterality: N/A;  . TRANSURETHRAL RESECTION OF BLADDER TUMOR WITH MITOMYCIN-C  08/24/2008    Family History  Problem Relation Age of Onset  . Diabetes Mother   . Cancer Father   . Coronary artery disease Father        MI at age 29.   . Sudden death Son   . CAD Maternal Grandfather        MI in his 22's  . CAD Paternal Grandfather     Social History:  reports that he quit smoking about 22 years ago. His smoking use included cigarettes. He has a 50.00 pack-year smoking history. He has never used smokeless tobacco. He reports previous alcohol use. He reports that he does not use drugs.    Review of Systems     HYPERTENSION: has been treated by his PCP with amlodipine 10 mg, hydralazine and clonidine Blood pressure also followed by cardiologist He is not on ACE or ARB medication  BP Readings from Last 3 Encounters:  12/14/19 132/86  11/23/19 (!) 150/80  10/28/19 (!) 162/60          Lipids: he is taking Lipitor 40 mg from his PCP LDL in 11/2019 was 41      Lab Results  Component Value Date   CHOL 104 06/16/2019   HDL 70 06/16/2019   LDLCALC 49 06/16/2019   TRIG 106 06/16/2019  CHOLHDL 3.8 07/16/2018               CKD: He has been followed by nephrologist for diabetic nephropathy.  Now being followed annually Last urine microalbumin ratio is much higher, previously had come back to normal with lisinopril  His creatinine is variable, most recently 1.49   Lab Results  Component Value Date   CREATININE 1.58 (H) 11/17/2019   CREATININE 1.51 (H) 11/17/2019   CREATININE 1.48 (H) 10/18/2019    Last foot exam in 08/2019  Has neuropathic symptoms controlled with low-dose gabapentin, also on Requip from PCP   Eye exam last:  1/19    Physical Examination:  BP 132/86 (BP Location: Left Arm, Patient Position: Sitting, Cuff Size: Normal)   Pulse 62   Ht 5\' 6"  (1.676 m)   Wt 200 lb 12.8 oz (91.1 kg)   SpO2 98%   BMI 32.41 kg/m       ASSESSMENT:  Diabetes type 2, insulin requiring  See history of present illness for detailed discussion of his current management, blood sugar patterns and problems identified  His A1c is 6.7 which was done by his PCP recently  Blood sugars were reviewed from download of his pump and details were analyzed from the online information as well as printout  As above his blood sugars are not controlled with starting the T-insulin pump He seems to have excessive hyperglycemia when he is eating higher fat meals or higher carbohydrate breakfast meals Also did not bolus enough for breakfast today Blood sugar however appears to be trending much lower after about 11 PM indicating excessive basal insulin Not clear why his control IQ setting was not active anyway and basal IQ was not activated when his blood sugar was near hypoglycemia range last night  HYPERTENSION: Blood pressure is appearing to be better  Renal function recently improved also  PLAN: Basal rate to be 2.3 at 10 PM Discussed current difficulties with control and the nurse educator will evaluate the pump settings including getting back on the control IQ Adjust boluses based on what he is eating and discussed needing to add another 5 to 7 units for higher fat meals Also he will take an average of 12 units bolus in the morning and not use the actual carbohydrate amounts He can continue using the same bolus recommendations as he was doing with the OmniPod pump Continue monitoring periodic fingersticks to make sure the Dexcom is calibrated accurately Discussed continuing to treat hypoglycemia as before especially if the pump is not able to prevent low sugars in the basal IQ mode  We'll review management again tomorrow  There  are no Patient Instructions on file for this visit.       Elayne Snare 12/14/2019, 11:05 AM   Note: This office note was prepared with Dragon voice recognition system technology. Any transcriptional errors that result from this process are unintentional.

## 2019-12-15 ENCOUNTER — Other Ambulatory Visit: Payer: Self-pay

## 2019-12-15 ENCOUNTER — Encounter: Payer: Medicare Other | Admitting: Nutrition

## 2019-12-15 ENCOUNTER — Encounter: Payer: Self-pay | Admitting: Endocrinology

## 2019-12-15 ENCOUNTER — Ambulatory Visit (INDEPENDENT_AMBULATORY_CARE_PROVIDER_SITE_OTHER): Payer: Medicare Other | Admitting: Endocrinology

## 2019-12-15 DIAGNOSIS — Z794 Long term (current) use of insulin: Secondary | ICD-10-CM

## 2019-12-15 DIAGNOSIS — Z6832 Body mass index (BMI) 32.0-32.9, adult: Secondary | ICD-10-CM | POA: Diagnosis not present

## 2019-12-15 DIAGNOSIS — E661 Drug-induced obesity: Secondary | ICD-10-CM

## 2019-12-15 DIAGNOSIS — I2510 Atherosclerotic heart disease of native coronary artery without angina pectoris: Secondary | ICD-10-CM

## 2019-12-15 DIAGNOSIS — E1165 Type 2 diabetes mellitus with hyperglycemia: Secondary | ICD-10-CM

## 2019-12-15 NOTE — Progress Notes (Signed)
Patient ID: Alec [name], male   DOB: 1946-04-20, 74 y.o.   MRN: [id number]             Reason for Appointment:  Follow-up for Type 2 Diabetes    History of Present Illness:          Diagnosis: Type 2 diabetes mellitus, date of diagnosis: 2001       Prior history: He was seen in consultation in 11/2014 when his A1c was 7.4 Since his blood sugars were averaging over 200 he was given Victoza in addition to his basal bolus insulin regimen in 6/16 He started on the V go pump in October 2016  Recent history:    INSULIN regimen is described as:  BASAL rate on T-Slim pump:  Midnight = 2.4, 2 AM 2.2, 4 PM-10 PM = 2.8 and 10 PM 2.3  Boluses 12-14 at breakfast, 12 units lunch and 18 at dinner  Non-insulin hypoglycemic drugs the patient is taking are: Victoza 1.2 mg daily   His A1c is currently 6.7 done by PCP, previously 7.3   Current blood sugar patterns from analysis of his meter download, daily management and problems identified:  He is on day 3 of the T-insulin pump  Blood sugars and management since yesterday:  His control IQ setting was turned on yesterday during his office visit, not clear why it was turned off  Hypoglycemia appears to be significant yesterday again during the daytime mostly between about 9 AM and 6 PM  This is despite his being in the control IQ mode  Premeal blood sugar at lunchtime was 222  At lunchtime with a bolus of 12 units blood sugars were still in the 200+ range  Blood sugar before dinner was 179 and 2 hours after the bolus blood sugar was down to 90  He had to homemade tacos with beef and cheese and gave 19 units  However subsequent blood sugar stayed below 100 until about 11 PM  Blood sugars did tend to rise for a couple of hours after midnight and then gradually came down to 135 at breakfast time  Although usually boluses 12 units at breakfast today he only had coffee and blood sugar went up significantly without a bolus  In the  office his blood sugar is still around 200 despite bolusing for breakfast  Hypoglycemia was avoided with the basal IQ modality when blood sugar was down to 87 around 8 PM  Side effects from medications have been:none  Glucose monitoring:  Now Dexcom, blood sugars as above  Previous data:    CGM use % of time  77  Average and SD  156, GV 37  Time in range     66   %  % Time Above 180  23  % Time above 250  8  % Time Below target  3    PRE-MEAL Fasting Lunch Dinner Bedtime Overall  Glucose range:       Mean/median:  117   191  190    POST-MEAL PC Breakfast PC Lunch PC Dinner  Glucose range:     Mean/median:  161  174  209    Self-care:  Meals: 3 meals per day. Lunch  1 pm Dinner 6 pm Breakfast is usually an egg sandwich but sometimes biscuits   Eating balanced meals in the evening.  He will have snacks with granola bars              Dietician visit, most recent: 6/15.  Weight history:  Wt Readings from Last 3 Encounters:  12/14/19 200 lb 12.8 oz (91.1 kg)  11/23/19 200 lb 3.2 oz (90.8 kg)  10/28/19 200 lb 3.2 oz (90.8 kg)    Glycemic control:   Lab Results  Component Value Date   HGBA1C 6.7 12/09/2019   HGBA1C 7.3 (H) 11/17/2019   HGBA1C 7.2 (H) 04/19/2019   Lab Results  Component Value Date   MICROALBUR 5.9 (H) 06/16/2018   LDLCALC 49 06/16/2019   CREATININE 1.58 (H) 11/17/2019   CREATININE 1.51 (H) 11/17/2019     Past history:  He was not symptomatic at time of diagnosis and was probably treated with Amaryl only.  No details are available of previous management However he thinks he was not given metformin at any time because of his "kidney problem" He had a A1c of over 9% in 2013 and may have been started on insulin at that time. Most likely has been taking Levemir and NovoLog since then A1c records show his results were over 8% in 2014 and in 4/15 but subsequently down to 7.1 in November 2015 Amaryl was stopped in 7/16  Other active  problems are addressed in Review of systems   Allergies as of 12/15/2019   No Known Allergies     Medication List       Accurate as of December 15, 2019  3:59 PM. If you have any questions, ask your nurse or doctor.        allopurinol 100 MG tablet Commonly known as: ZYLOPRIM Take 200 mg by mouth daily after supper.   amLODipine 10 MG tablet Commonly known as: NORVASC Take 1 tablet (10 mg total) by mouth daily.   aspirin EC 81 MG tablet Take 81 mg by mouth daily.   atorvastatin 40 MG tablet Commonly known as: LIPITOR Take 1 tablet (40 mg total) by mouth daily at 6 PM.   buPROPion 150 MG 24 hr tablet Commonly known as: WELLBUTRIN XL Take 150 mg by mouth daily.   cloNIDine 0.1 MG tablet Commonly known as: CATAPRES Take 1 tablet (0.1 mg total) by mouth once for 1 dose.   folic acid 1 MG tablet Commonly known as: FOLVITE Take 1 tablet (1 mg total) by mouth daily.   FREESTYLE LITE test strip Generic drug: glucose blood USE AS INSTRUCTED TO TEST BLOOD SUGARS 3 TIMES DAILY. DX CODE-E11.65 (PAY FOR 100 IN 90 DAYS)   hydrALAZINE 50 MG tablet Commonly known as: APRESOLINE Take 1 tablet (50 mg total) by mouth 3 (three) times daily.   insulin aspart 100 UNIT/ML injection Commonly known as: NovoLOG INJECT 85 UNITS SUBCUTANEOUSLY ONCE DAILY WITH INSULIN PUMP   methotrexate 2.5 MG tablet Commonly known as: RHEUMATREX Take 2.5 mg by mouth once a week. Pt take 6 tablets once a week   metoprolol tartrate 50 MG tablet Commonly known as: LOPRESSOR Take 1 tablet (50 mg total) by mouth 2 (two) times daily.   nitroGLYCERIN 0.4 MG SL tablet Commonly known as: NITROSTAT Place 1 tablet (0.4 mg total) under the tongue every 5 (five) minutes as needed for chest pain.   OSTEO BI-FLEX REGULAR STRENGTH PO Take 1 tablet by mouth daily after supper.   sertraline 50 MG tablet Commonly known as: ZOLOFT Take 25 mg by mouth daily.       Allergies: No Known Allergies  Past Medical  History:  Diagnosis Date  . Anxiety   . Arthritis    "maybe in my hands" (07/15/2018)  . Bladder  cancer ([place]) 2009  . CKD (chronic kidney disease), stage III   . Coronary artery disease   . Degenerative arthritis of shoulder region 08/2013   left  . Disorder of bursae and tendons in left shoulder region 08/2013  . Gout    "on daily RX" (07/15/2018)  . History of kidney stones   . Hyperlipidemia   . Hypertension    under control with meds., has been on med. > 10 yr.  . IDDM (insulin dependent diabetes mellitus)     Past Surgical History:  Procedure Laterality Date  . CARDIAC CATHETERIZATION  07/15/2018  . CHOLECYSTECTOMY  08/29/2012   Procedure: LAPAROSCOPIC CHOLECYSTECTOMY WITH INTRAOPERATIVE CHOLANGIOGRAM;  Surgeon: Imogene Burn. Tsuei, MD;  Location: [facility];  Service: General;  Laterality: N/A;  . CORONARY ARTERY BYPASS GRAFT N/A 07/20/2018   Procedure: CORONARY ARTERY BYPASS GRAFTING (CABG) times three using left internal mammary artery to the LAD, and using endoscopically harvested right saphenous vein to PDA and intermedius.;  Surgeon: Grace Isaac, MD;  Location: [facility];  Service: Open Heart Surgery;  Laterality: N/A;  . LEFT HEART CATH AND CORONARY ANGIOGRAPHY N/A 07/15/2018   Procedure: LEFT HEART CATH AND CORONARY ANGIOGRAPHY;  Surgeon: Leonie Man, MD;  Location: [place] [facility];  Service: Cardiovascular;  Laterality: N/A;  . SHOULDER ARTHROSCOPY W/ ROTATOR CUFF REPAIR Right 2018  . SHOULDER ARTHROSCOPY WITH SUBACROMIAL DECOMPRESSION, ROTATOR CUFF REPAIR AND BICEP TENDON REPAIR Left 09/03/2013   Procedure: LEFT SHOULDER ARTHROSCOPY WITH EXTENSIVE DEBRIDMENT, DISTAL CLAVICULECTOMY, ROTATOR CUFF REPAIR AND SUBACROMIAL DECOMPRESSION PARTIAL ACRIOMIOPLASTY WITH CORACOACROMIAL RELEASE;  Surgeon: Renette Butters, MD;  Location: [facility];  Service: Orthopedics;  Laterality: Left;  . TEE WITHOUT CARDIOVERSION N/A 07/20/2018   Procedure: TRANSESOPHAGEAL  ECHOCARDIOGRAM (TEE);  Surgeon: Grace Isaac, MD;  Location: [facility];  Service: Open Heart Surgery;  Laterality: N/A;  . TRANSURETHRAL RESECTION OF BLADDER TUMOR WITH MITOMYCIN-C  08/24/2008    Family History  Problem Relation Age of Onset  . Diabetes Mother   . Cancer Father   . Coronary artery disease Father        MI at age 39.   . Sudden death Son   . CAD Maternal Grandfather        MI in his 80's  . CAD Paternal Grandfather     Social History:  reports that he quit smoking about 22 years ago. His smoking use included cigarettes. He has a 50.00 pack-year smoking history. He has never used smokeless tobacco. He reports previous alcohol use. He reports that he does not use drugs.    Review of Systems     HYPERTENSION: has been treated by his PCP with amlodipine 10 mg, hydralazine and clonidine Blood pressure also followed by cardiologist He is not on ACE or ARB medication  BP Readings from Last 3 Encounters:  12/14/19 132/86  11/23/19 (!) 150/80  10/28/19 (!) 162/60          Lipids: he is taking Lipitor 40 mg from his PCP LDL in 11/2019 was 41      Lab Results  Component Value Date   CHOL 104 06/16/2019   HDL 70 06/16/2019   LDLCALC 49 06/16/2019   TRIG 106 06/16/2019   CHOLHDL 3.8 07/16/2018               CKD: He has been followed by nephrologist for diabetic nephropathy.  Now being followed annually Last urine microalbumin ratio is much higher, previously had  come back to normal with lisinopril  His creatinine is variable but generally stable   Lab Results  Component Value Date   CREATININE 1.58 (H) 11/17/2019   CREATININE 1.51 (H) 11/17/2019   CREATININE 1.48 (H) 10/18/2019    Last foot exam in 08/2019  Has neuropathic symptoms controlled with low-dose gabapentin, also on Requip from PCP   Eye exam last:  1/19   Physical Examination:  There were no vitals taken for this visit.      ASSESSMENT:  Diabetes type 2, insulin requiring  See  history of present illness for detailed discussion of his current management, blood sugar patterns and problems identified  His A1c is 6.7 which was done by his PCP recently  He is coming here today for follow-up of his T-slim pump start on Monday Blood sugars were reviewed from download of his pump and details were analyzed from the online information as well as printout  He is back on the control IQ modality and blood sugars are overall better also As discussed above his blood sugar patterns indicate higher readings late morning and afternoons overall and tendency to lower readings late evening However there is some increase in blood sugars early part of the night Postprandial readings not consistently controlled but also not clear if he overbalanced for his evening meal although his blood sugar came down only about 3-1/2 hours after the bolus  He is trying to bolus when he needs to and getting more comfortable using the pump  His current management, blood sugar variability and causes of high and low blood sugars was discussed as well as explanation of how the pump is working in the control IQ mode  PLAN: Basal rates to be changed as follows: 12 AM-3 AM = 2.5, 3 AM-8 AM = 2.2, 8 AM-4 PM = 2.4, 4 PM-7 PM = 2.8, 7 PM = 2.6 and 10 PM = 2.3  Continue adjusting boluses based on meal size and portions If drinking coffee separately in the morning he will need to bolus 3 to 4 units separately for this Reminded him to bolus consistently before starting the meal Continue education with pump management with nurse educator Will need to likely have him reduce his targets using exercise mode if he is planning to be very active If he is starting to get low sugars again or persistently high readings he will call Follow-up next week  There are no Patient Instructions on file for this visit.       Elayne Snare 12/15/2019, 3:59 PM   Note: This office note was prepared with Dragon voice recognition  system technology. Any transcriptional errors that result from this process are unintentional.

## 2019-12-16 DIAGNOSIS — E1122 Type 2 diabetes mellitus with diabetic chronic kidney disease: Secondary | ICD-10-CM | POA: Diagnosis not present

## 2019-12-16 DIAGNOSIS — R0683 Snoring: Secondary | ICD-10-CM | POA: Diagnosis not present

## 2019-12-16 DIAGNOSIS — E782 Mixed hyperlipidemia: Secondary | ICD-10-CM | POA: Diagnosis not present

## 2019-12-16 DIAGNOSIS — N183 Chronic kidney disease, stage 3 unspecified: Secondary | ICD-10-CM | POA: Diagnosis not present

## 2019-12-16 DIAGNOSIS — G2581 Restless legs syndrome: Secondary | ICD-10-CM | POA: Diagnosis not present

## 2019-12-16 DIAGNOSIS — I129 Hypertensive chronic kidney disease with stage 1 through stage 4 chronic kidney disease, or unspecified chronic kidney disease: Secondary | ICD-10-CM | POA: Diagnosis not present

## 2019-12-16 NOTE — Patient Instructions (Signed)
Review handouts and manual. Call 800 number if questions or problems with the pump Call office if readings remain over 350, drop below 70.

## 2019-12-16 NOTE — Patient Instructions (Signed)
Review handouts given and pump manual. Call if questions

## 2019-12-16 NOTE — Progress Notes (Addendum)
Patient reports no difficulty giving boluses yesterday, and we reviewed high blood sugar protocols, and other safety features. He did a cartridge change with little assistance from me.  We review the steps and he was given a handout with there steps for this.  Settings were changed per Dr.Kumar's orders:  Basal rate: MN: 2.5, 3AM: 22, 8AM: 2.4, 4PM: 2.8, 7PM: 2.6, 10PM: 2.3.   We reviewed all other topics on pump training checklist and he signed this indicating that he understood all topics.  He had no final questions. Pump was linked to our system on 3/15, and t-connect and dexcom apps working well on his model phone, and patient is using them to view blood sugars without difficulty

## 2019-12-16 NOTE — Progress Notes (Signed)
Patient reports no difficulty giving boluses and we reviewed how to adjust settings, how to respond to alerts and alarms, how to start/stop pump, symbols showing on pump during Control IQ features.  He was given handouts on all of the above. Settings were changed by patient with some assistance from me, per DrMarland Kitchen [name] Derby orders:  10PM basal rated changed to 2.3u/hr.

## 2019-12-17 ENCOUNTER — Ambulatory Visit: Payer: Medicare Other | Admitting: Endocrinology

## 2019-12-21 ENCOUNTER — Encounter: Payer: Self-pay | Admitting: Endocrinology

## 2019-12-21 ENCOUNTER — Other Ambulatory Visit: Payer: Self-pay

## 2019-12-21 ENCOUNTER — Encounter: Payer: Medicare Other | Admitting: Nutrition

## 2019-12-21 ENCOUNTER — Ambulatory Visit (INDEPENDENT_AMBULATORY_CARE_PROVIDER_SITE_OTHER): Payer: Medicare Other | Admitting: Endocrinology

## 2019-12-21 VITALS — BP 128/54 | HR 64 | Ht 66.0 in | Wt 200.6 lb

## 2019-12-21 DIAGNOSIS — E1165 Type 2 diabetes mellitus with hyperglycemia: Secondary | ICD-10-CM | POA: Diagnosis not present

## 2019-12-21 DIAGNOSIS — E1121 Type 2 diabetes mellitus with diabetic nephropathy: Secondary | ICD-10-CM | POA: Diagnosis not present

## 2019-12-21 DIAGNOSIS — Z794 Long term (current) use of insulin: Secondary | ICD-10-CM

## 2019-12-21 DIAGNOSIS — I2510 Atherosclerotic heart disease of native coronary artery without angina pectoris: Secondary | ICD-10-CM

## 2019-12-21 NOTE — Progress Notes (Signed)
Patient ID: Alec [name], male   DOB: 12-03-1945, 74 y.o.   MRN: [id number]             Reason for Appointment:  Follow-up for Type 2 Diabetes    History of Present Illness:          Diagnosis: Type 2 diabetes mellitus, date of diagnosis: 2001       Prior history: He was seen in consultation in 11/2014 when his A1c was 7.4 Since his blood sugars were averaging over 200 he was given Victoza in addition to his basal bolus insulin regimen in 6/16 He started on the V go pump in October 2016  Recent history:    INSULIN regimen is described as:  BASAL rate on T-Slim pump:  Midnight = 2.5, 3 AM = 2.2, 8 AM = 2.4, 4 PM = 2.8, 7 PM = 2.6 and 10 PM = 2.3  Boluses 12-14 at breakfast, 12 units lunch and 18 at dinner  Recent total daily insulin dose about 55 units  Non-insulin hypoglycemic drugs the patient is taking are: Victoza 1.2 mg daily   His A1c was 6.7 done by PCP, previously 7.3   Current blood sugar patterns from analysis of his meter download, daily management and problems identified:  His control IQ setting has been active since his last visit   He is having some inconsistency in his blood sugars in the last few days  HYPOGLYCEMIA appears to be occurring mostly in the evening after about 8-9 PM  However he has somewhat inconsistent rise in blood sugars in the afternoon  POSTPRANDIAL blood sugars do not show any consistent pattern but sometimes will go up in the evening although not right after eating  Hypoglycemia appears to be minimal with only occasional low normal readings around 3 AM or yesterday before dinner  AVERAGE blood sugar is 172  HYPERGLYCEMIA above 180 is present 36% of the time currently with 64% readings within range  He is not clear which types of meals makes his blood sugars go up and is usually taking about the same amount of bolus  Side effects from medications have been:none  Glucose monitoring:  Now Dexcom, blood sugars as  above  Previous data:    CGM use % of time  77  Average and SD  156, GV 37  Time in range     66   %  % Time Above 180  23  % Time above 250  8  % Time Below target  3    PRE-MEAL Fasting Lunch Dinner Bedtime Overall  Glucose range:       Mean/median:  117   191  190    POST-MEAL PC Breakfast PC Lunch PC Dinner  Glucose range:     Mean/median:  161  174  209    Self-care:  Meals: 3 meals per day. Lunch  1 pm Dinner 6 pm Breakfast is usually an egg sandwich but sometimes biscuits   Eating balanced meals in the evening.  He will have snacks with granola bars              Dietician visit, most recent: 6/15.               Weight history:  Wt Readings from Last 3 Encounters:  12/21/19 200 lb 9.6 oz (91 kg)  12/14/19 200 lb 12.8 oz (91.1 kg)  11/23/19 200 lb 3.2 oz (90.8 kg)    Glycemic control:   Lab Results  Component Value Date   HGBA1C 6.7 12/09/2019   HGBA1C 7.3 (H) 11/17/2019   HGBA1C 7.2 (H) 04/19/2019   Lab Results  Component Value Date   MICROALBUR 5.9 (H) 06/16/2018   LDLCALC 49 06/16/2019   CREATININE 1.58 (H) 11/17/2019   CREATININE 1.51 (H) 11/17/2019     Past history:  He was not symptomatic at time of diagnosis and was probably treated with Amaryl only.  No details are available of previous management However he thinks he was not given metformin at any time because of his "kidney problem" He had a A1c of over 9% in 2013 and may have been started on insulin at that time. Most likely has been taking Levemir and NovoLog since then A1c records show his results were over 8% in 2014 and in 4/15 but subsequently down to 7.1 in November 2015 Amaryl was stopped in 7/16  Other active problems are addressed in Review of systems   Allergies as of 12/21/2019   No Known Allergies     Medication List       Accurate as of December 21, 2019  9:55 AM. If you have any questions, ask your nurse or doctor.        STOP taking these medications   cloNIDine  0.1 MG tablet Commonly known as: CATAPRES Stopped by: Elayne Snare, MD     TAKE these medications   allopurinol 100 MG tablet Commonly known as: ZYLOPRIM Take 200 mg by mouth daily after supper.   amLODipine 10 MG tablet Commonly known as: NORVASC Take 1 tablet (10 mg total) by mouth daily.   aspirin EC 81 MG tablet Take 81 mg by mouth daily.   atorvastatin 40 MG tablet Commonly known as: LIPITOR Take 1 tablet (40 mg total) by mouth daily at 6 PM.   buPROPion 150 MG 24 hr tablet Commonly known as: WELLBUTRIN XL Take 150 mg by mouth daily.   folic acid 1 MG tablet Commonly known as: FOLVITE Take 1 tablet (1 mg total) by mouth daily.   FREESTYLE LITE test strip Generic drug: glucose blood USE AS INSTRUCTED TO TEST BLOOD SUGARS 3 TIMES DAILY. DX CODE-E11.65 (PAY FOR 100 IN 90 DAYS)   hydrALAZINE 50 MG tablet Commonly known as: APRESOLINE Take 1 tablet (50 mg total) by mouth 3 (three) times daily.   insulin aspart 100 UNIT/ML injection Commonly known as: NovoLOG INJECT 85 UNITS SUBCUTANEOUSLY ONCE DAILY WITH INSULIN PUMP   methotrexate 2.5 MG tablet Commonly known as: RHEUMATREX Take 2.5 mg by mouth once a week. Pt take 5 tablets once a week   metoprolol tartrate 50 MG tablet Commonly known as: LOPRESSOR Take 1 tablet (50 mg total) by mouth 2 (two) times daily.   nitroGLYCERIN 0.4 MG SL tablet Commonly known as: NITROSTAT Place 1 tablet (0.4 mg total) under the tongue every 5 (five) minutes as needed for chest pain.   OSTEO BI-FLEX REGULAR STRENGTH PO Take 1 tablet by mouth daily after supper.   sertraline 50 MG tablet Commonly known as: ZOLOFT Take 25 mg by mouth daily.       Allergies: No Known Allergies  Past Medical History:  Diagnosis Date  . Anxiety   . Arthritis    "maybe in my hands" (07/15/2018)  . Bladder cancer ([facility]) 2009  . CKD (chronic kidney disease), stage III   . Coronary artery disease   . Degenerative arthritis of shoulder region  08/2013   left  . Disorder of bursae and tendons in  left shoulder region 08/2013  . Gout    "on daily RX" (07/15/2018)  . History of kidney stones   . Hyperlipidemia   . Hypertension    under control with meds., has been on med. > 10 yr.  . IDDM (insulin dependent diabetes mellitus)     Past Surgical History:  Procedure Laterality Date  . CARDIAC CATHETERIZATION  07/15/2018  . CHOLECYSTECTOMY  08/29/2012   Procedure: LAPAROSCOPIC CHOLECYSTECTOMY WITH INTRAOPERATIVE CHOLANGIOGRAM;  Surgeon: Imogene Burn. Tsuei, MD;  Location: [facility];  Service: General;  Laterality: N/A;  . CORONARY ARTERY BYPASS GRAFT N/A 07/20/2018   Procedure: CORONARY ARTERY BYPASS GRAFTING (CABG) times three using left internal mammary artery to the LAD, and using endoscopically harvested right saphenous vein to PDA and intermedius.;  Surgeon: Grace Isaac, MD;  Location: [facility];  Service: Open Heart Surgery;  Laterality: N/A;  . LEFT HEART CATH AND CORONARY ANGIOGRAPHY N/A 07/15/2018   Procedure: LEFT HEART CATH AND CORONARY ANGIOGRAPHY;  Surgeon: Leonie Man, MD;  Location: [facility];  Service: Cardiovascular;  Laterality: N/A;  . SHOULDER ARTHROSCOPY W/ ROTATOR CUFF REPAIR Right 2018  . SHOULDER ARTHROSCOPY WITH SUBACROMIAL DECOMPRESSION, ROTATOR CUFF REPAIR AND BICEP TENDON REPAIR Left 09/03/2013   Procedure: LEFT SHOULDER ARTHROSCOPY WITH EXTENSIVE DEBRIDMENT, DISTAL CLAVICULECTOMY, ROTATOR CUFF REPAIR AND SUBACROMIAL DECOMPRESSION PARTIAL ACRIOMIOPLASTY WITH CORACOACROMIAL RELEASE;  Surgeon: Renette Butters, MD;  Location: [facility];  Service: Orthopedics;  Laterality: Left;  . TEE WITHOUT CARDIOVERSION N/A 07/20/2018   Procedure: TRANSESOPHAGEAL ECHOCARDIOGRAM (TEE);  Surgeon: Grace Isaac, MD;  Location: [facility];  Service: Open Heart Surgery;  Laterality: N/A;  . TRANSURETHRAL RESECTION OF BLADDER TUMOR WITH MITOMYCIN-C  08/24/2008    Family History  Problem Relation Age of  Onset  . Diabetes Mother   . Cancer Father   . Coronary artery disease Father        MI at age 8.   . Sudden death Son   . CAD Maternal Grandfather        MI in his 32's  . CAD Paternal Grandfather     Social History:  reports that he quit smoking about 22 years ago. His smoking use included cigarettes. He has a 50.00 pack-year smoking history. He has never used smokeless tobacco. He reports previous alcohol use. He reports that he does not use drugs.    Review of Systems   The following is a copy of the previous note:    HYPERTENSION: has been treated by his PCP with amlodipine 10 mg, hydralazine and clonidine Blood pressure also followed by cardiologist He is not on ACE or ARB medication  BP Readings from Last 3 Encounters:  12/21/19 (!) 128/54  12/14/19 132/86  11/23/19 (!) 150/80          Lipids: he is taking Lipitor 40 mg from his PCP LDL in 11/2019 was 41      Lab Results  Component Value Date   CHOL 104 06/16/2019   HDL 70 06/16/2019   [place] 49 06/16/2019   TRIG 106 06/16/2019   CHOLHDL 3.8 07/16/2018               CKD: He has been followed by nephrologist for diabetic nephropathy.  Now being followed annually Last urine microalbumin ratio is much higher, previously had come back to normal with lisinopril  His creatinine is variable but generally stable   Lab Results  Component Value Date   CREATININE 1.58 (H)  11/17/2019   CREATININE 1.51 (H) 11/17/2019   CREATININE 1.48 (H) 10/18/2019    Last foot exam in 08/2019  Has neuropathic symptoms controlled with low-dose gabapentin, also on Requip from PCP   Eye exam last:  1/19   Physical Examination:  BP (!) 128/54 (BP Location: Left Arm, Patient Position: Sitting, Cuff Size: Normal)   Pulse 64   Ht 5\' 6"  (1.676 m)   Wt 200 lb 9.6 oz (91 kg)   SpO2 97%   BMI 32.38 kg/m       ASSESSMENT:  Diabetes type 2, insulin requiring  See history of present illness for detailed discussion of his  current management, blood sugar patterns and problems identified  His A1c is last 6.7 which was done by his PCP  His blood sugar control with the rehab insulin pump is fair with some periods of hyperglycemia but not consistently  He probably has more tendency to high readings after but 7-8 PM periodically midday and afternoon Again no consistent pattern seen Most of his blood sugar patterns overnight appear to be fairly good with only no significant rise after dinner that was prolonged after his meal on Saturday evening He is doing some yard work but does not appear to have significant hypoglycemia with this  He still not comfortable making changes in his pump and is not familiar with the exercise mode on his pump also Currently he is still getting some issues with his Dexcom  Renal dysfunction: This has been mild and stable, last creatinine about 1.5 and will need follow-up regularly  PLAN: Basal rates to be changed at 4 PM down slightly to 2.7, increase at 7 PM up to 2.8 and 10 PM up to 2.4 No change in carbohydrate ratio at this time but he may need to add more carbohydrates to his bolus amount with evening meals based on what he is eating  He will work with the diabetes educator today for troubleshooting Also he will need to learn how to adjust his basal rates himself Discussed using the exercise more when he is trying to be more active outside Blood sugar targets, prevention of hypoglycemia and management of hyperglycemia discussed  Follow-up in 3 months unless having issues with consistent hyperglycemia  There are no Patient Instructions on file for this visit.       Elayne Snare 12/21/2019, 9:55 AM   Note: This office note was prepared with Dragon voice recognition system technology. Any transcriptional errors that result from this process are unintentional.

## 2019-12-23 NOTE — Progress Notes (Signed)
Alec [name] is here with his wife to review all subjects of pump training.  He appears to be bolusing correctly, and changing his cartridge and infusion sets correctly.  He reports going on [employer] for this instruction.  He was questioning how/when to remove and reinset his new sensor with respect to his pump.  He was shown the steps for this and given a handout with written steps for this when the time is appropriate for sensor removal.  We also reviewed high blood sugar  and sick day protocols, and he had no final questions.

## 2019-12-23 NOTE — Patient Instructions (Addendum)
Review steps for removing and reinserting sensors.   Call [employer] help line if questions for this.

## 2019-12-29 ENCOUNTER — Telehealth: Payer: Self-pay | Admitting: Cardiology

## 2019-12-29 MED ORDER — AMLODIPINE BESYLATE 10 MG PO TABS: 10.0000 mg | ORAL_TABLET | Freq: Every day | ORAL | 3 refills | Status: DC

## 2019-12-29 NOTE — Telephone Encounter (Signed)
Amlodipine refill sent to [facility] on [address].

## 2019-12-29 NOTE — Telephone Encounter (Signed)
New message   Pt c/o medication issue:  1. Name of Medication: amLODipine (NORVASC) 10 MG tablet  2. How are you currently taking this medication (dosage and times per day)?as written  3. Are you having a reaction (difficulty breathing--STAT)? no  4. What is your medication issue? Patient needs a new prescription sent to [facility] on [address] in [place], [place]

## 2020-01-03 DIAGNOSIS — G4733 Obstructive sleep apnea (adult) (pediatric): Secondary | ICD-10-CM | POA: Diagnosis not present

## 2020-01-04 DIAGNOSIS — M0609 Rheumatoid arthritis without rheumatoid factor, multiple sites: Secondary | ICD-10-CM | POA: Diagnosis not present

## 2020-01-06 ENCOUNTER — Other Ambulatory Visit: Payer: Self-pay

## 2020-01-10 ENCOUNTER — Telehealth: Payer: Self-pay | Admitting: Endocrinology

## 2020-01-10 ENCOUNTER — Other Ambulatory Visit: Payer: Self-pay

## 2020-01-10 ENCOUNTER — Other Ambulatory Visit: Payer: Self-pay | Admitting: Endocrinology

## 2020-01-10 MED ORDER — INSULIN LISPRO 100 UNIT/ML ~~LOC~~ SOLN: 85.0000 [IU] | Freq: Every day | SUBCUTANEOUS | 1 refills | Status: DC

## 2020-01-10 NOTE — Telephone Encounter (Signed)
Patient requests to be called at ph# [phone]  re: Patient's PHARM [facility] ) told patient to call Olen Cordial to discuss Novolog-patient has been having trouble getting his medication-Pharmacy told patient to call Olen Cordial to see if anything re: Novolog has been changed (e.g RX).

## 2020-01-10 NOTE — Telephone Encounter (Signed)
Called pt and informed him that Humalog was sent in place of Novolog since insurance would not pay for this. Pt verbalized understanding and then requested that Humalog Rx be sent to [facility] on [address].  Rx was sent.

## 2020-01-15 ENCOUNTER — Encounter: Payer: Self-pay | Admitting: *Deleted

## 2020-01-15 ENCOUNTER — Other Ambulatory Visit: Payer: Self-pay

## 2020-01-15 ENCOUNTER — Ambulatory Visit
Admission: EM | Admit: 2020-01-15 | Discharge: 2020-01-15 | Disposition: A | Discharge status: Home / Self Care | Payer: Medicare Other

## 2020-01-15 DIAGNOSIS — M545 Low back pain, unspecified: Secondary | ICD-10-CM

## 2020-01-15 DIAGNOSIS — R001 Bradycardia, unspecified: Secondary | ICD-10-CM | POA: Diagnosis not present

## 2020-01-15 MED ORDER — DICLOFENAC SODIUM 1 % EX GEL: 4.0000 g | Freq: Four times a day (QID) | CUTANEOUS | 0 refills | Status: DC

## 2020-01-15 NOTE — ED Triage Notes (Signed)
Reports feeling sudden onset right lower back pain while swinging a golf club approx 1.5 wks ago.  States was starting to feel better, but started to get worse again when playing golf yesterday.  Right lower back tender to palpation.

## 2020-01-15 NOTE — ED Provider Notes (Signed)
[facility]    CSN: [id number] Arrival date & time: 01/15/20  1139      History   Chief Complaint Chief Complaint  Patient presents with  . Back Pain    HPI Alec [name] is a 74 y.o. male.   74 year old male comes in for right lower back pain. States 1 to 2 weeks ago, felt the pain while swinging a golf club. He rested, and pain improved. He played golf yesterday, and started feeling worsening of pain. States later on that day, when bending over doing yard work, felt worsening pain, and came in for evaluation. Denies numbness, tingling, radiation of pain. Denies saddle anesthesia, loss of bladder or bowel control. Took Tylenol without relief.  Incidental finding of bradycardia at triage. Patient is asymptomatic. Denies chest pain, shortness of breath, weakness, dizziness, syncope. Denies exertional chest pain, exertional fatigue, dyspnea on exertion. On Metoprolol.     Past Medical History:  Diagnosis Date  . Anxiety   . Arthritis    "maybe in my hands" (07/15/2018)  . Bladder cancer ([place]) 2009  . CKD (chronic kidney disease), stage III   . Coronary artery disease   . Degenerative arthritis of shoulder region 08/2013   left  . Disorder of bursae and tendons in left shoulder region 08/2013  . Gout    "on daily RX" (07/15/2018)  . History of kidney stones   . Hyperlipidemia   . Hypertension    under control with meds., has been on med. > 10 yr.  . IDDM (insulin dependent diabetes mellitus)     Patient Active Problem List   Diagnosis Date Noted  . Educated about COVID-19 virus infection 10/26/2019  . SOB (shortness of breath) 11/10/2018  . Cough 11/10/2018  . Wheezing 11/10/2018  . Coronary artery disease involving native coronary artery of native heart without angina pectoris 11/10/2018  . Dyslipidemia 11/10/2018  . CRI (chronic renal insufficiency), stage 3 (moderate) 08/06/2018  . S/P CABG x 3 07/20/2018  . Angina, class II ([place]) 07/14/2018  .  Abnormal stress electrocardiography using treadmill 07/14/2018  . Cellulitis of left arm 12/10/2017  . Cellulitis 12/09/2017  . Diabetic nephropathy associated with type 2 diabetes mellitus ([place]) 12/14/2014  . Essential hypertension 12/14/2014  . IDDM (insulin dependent diabetes mellitus) 08/29/2012  . Dyslipidemia, goal LDL below 70 08/29/2012  . Abdominal pain 08/29/2012    Past Surgical History:  Procedure Laterality Date  . CARDIAC CATHETERIZATION  07/15/2018  . CHOLECYSTECTOMY  08/29/2012   Procedure: LAPAROSCOPIC CHOLECYSTECTOMY WITH INTRAOPERATIVE CHOLANGIOGRAM;  Surgeon: Imogene Burn. Tsuei, MD;  Location: [facility];  Service: General;  Laterality: N/A;  . CORONARY ARTERY BYPASS GRAFT N/A 07/20/2018   Procedure: CORONARY ARTERY BYPASS GRAFTING (CABG) times three using left internal mammary artery to the LAD, and using endoscopically harvested right saphenous vein to PDA and intermedius.;  Surgeon: Grace Isaac, MD;  Location: [facility];  Service: Open Heart Surgery;  Laterality: N/A;  . LEFT HEART CATH AND CORONARY ANGIOGRAPHY N/A 07/15/2018   Procedure: LEFT HEART CATH AND CORONARY ANGIOGRAPHY;  Surgeon: Leonie Man, MD;  Location: [facility];  Service: Cardiovascular;  Laterality: N/A;  . SHOULDER ARTHROSCOPY W/ ROTATOR CUFF REPAIR Right 2018  . SHOULDER ARTHROSCOPY WITH SUBACROMIAL DECOMPRESSION, ROTATOR CUFF REPAIR AND BICEP TENDON REPAIR Left 09/03/2013   Procedure: LEFT SHOULDER ARTHROSCOPY WITH EXTENSIVE DEBRIDMENT, DISTAL CLAVICULECTOMY, ROTATOR CUFF REPAIR AND SUBACROMIAL DECOMPRESSION PARTIAL ACRIOMIOPLASTY WITH CORACOACROMIAL RELEASE;  Surgeon: Renette Butters, MD;  Location:  [place];  Service: Orthopedics;  Laterality: Left;  . TEE WITHOUT CARDIOVERSION N/A 07/20/2018   Procedure: TRANSESOPHAGEAL ECHOCARDIOGRAM (TEE);  Surgeon: Grace Isaac, MD;  Location: [facility];  Service: Open Heart Surgery;  Laterality: N/A;  . TRANSURETHRAL RESECTION OF  BLADDER TUMOR WITH MITOMYCIN-C  08/24/2008       Home Medications    Prior to Admission medications   Medication Sig Start Date End Date Taking? Authorizing Provider  allopurinol (ZYLOPRIM) 100 MG tablet Take 200 mg by mouth daily after supper.   Yes [provider]  amLODipine (NORVASC) 10 MG tablet Take 1 tablet (10 mg total) by mouth daily. 12/29/19  Yes Minus Breeding, MD  aspirin EC 81 MG tablet Take 81 mg by mouth daily.   Yes [provider]  atorvastatin (LIPITOR) 40 MG tablet Take 1 tablet (40 mg total) by mouth daily at 6 PM. 05/24/19  Yes Lendon Colonel, NP  Boswellia-Glucosamine-Vit D (OSTEO BI-FLEX ONE PER DAY PO) Take by mouth.   Yes [provider]  Cholecalciferol (VITAMIN D3 PO) Take by mouth.   Yes [provider]  CINNAMON PO Take by mouth.   Yes [provider]  cloNIDine (CATAPRES) 0.1 MG tablet Take 0.1 mg by mouth 2 (two) times daily.   Yes [provider]  folic acid (FOLVITE) 1 MG tablet Take 1 tablet (1 mg total) by mouth daily. 10/28/19  Yes Minus Breeding, MD  hydrALAZINE (APRESOLINE) 50 MG tablet Take 1 tablet (50 mg total) by mouth 3 (three) times daily. 08/17/19 01/15/20 Yes Hochrein, Jeneen Rinks, MD  insulin aspart (NOVOLOG) 100 UNIT/ML injection Inject into the skin. Via pump   Yes [provider]  methotrexate (RHEUMATREX) 2.5 MG tablet Take 2.5 mg by mouth once a week. Pt take 5 tablets once a week 05/03/19  Yes [provider]  metoprolol tartrate (LOPRESSOR) 50 MG tablet Take 1 tablet (50 mg total) by mouth 2 (two) times daily. 10/28/19  Yes Minus Breeding, MD  Misc Natural Products (TART CHERRY ADVANCED PO) Take by mouth.   Yes [provider]  Multiple Vitamins-Minerals (CENTRUM SILVER PO) Take by mouth.   Yes [provider]  sertraline (ZOLOFT) 50 MG tablet Take 25 mg by mouth daily.  03/26/19  Yes [provider]  buPROPion (WELLBUTRIN XL) 150 MG 24 hr  tablet Take 150 mg by mouth daily.    [provider]  diclofenac Sodium (VOLTAREN) 1 % GEL Apply 4 g topically 4 (four) times daily. 01/15/20   Tasia Catchings, Quanna Wittke V, PA-C  Glucosamine-Chondroitin (OSTEO BI-FLEX REGULAR STRENGTH PO) Take 1 tablet by mouth daily after supper.    [provider]  glucose blood (FREESTYLE LITE) test strip USE AS INSTRUCTED TO TEST BLOOD SUGARS 3 TIMES DAILY. DX CODE-E11.65 (PAY FOR 100 IN 90 DAYS) 10/27/19   Elayne Snare, MD  insulin lispro (HUMALOG) 100 UNIT/ML injection Inject 0.85 mLs (85 Units total) into the skin daily. Use max of 85 units daily via insulin pump. 01/10/20   Elayne Snare, MD  nitroGLYCERIN (NITROSTAT) 0.4 MG SL tablet Place 1 tablet (0.4 mg total) under the tongue every 5 (five) minutes as needed for chest pain. Patient not taking: Reported on 06/24/2019 08/06/18 05/24/19  Erlene Quan, PA-C    Family History Family History  Problem Relation Age of Onset  . Diabetes Mother   . Cancer Father   . Coronary artery disease Father        MI at  age 29.   . Sudden death Son   . CAD Maternal Grandfather        MI in his 52's  . CAD Paternal Grandfather     Social History Social History   Tobacco Use  . Smoking status: Former Smoker    Packs/day: 2.00    Years: 25.00    Pack years: 50.00    Types: Cigarettes    Quit date: 09/11/1997    Years since quitting: 22.3  . Smokeless tobacco: Never Used  Substance Use Topics  . Alcohol use: Not Currently    Comment: 07/17/2018 "nothing since the mid 1990s"  . Drug use: Never     Allergies   Patient has no known allergies.   Review of Systems Review of Systems  Reason unable to perform ROS: See HPI as above.     Physical Exam Triage Vital Signs ED Triage Vitals [01/15/20 1147]  Enc Vitals Group     BP 137/77     Pulse Rate (!) 53     Resp 16     Temp (!) 97.5 F (36.4 C)     Temp Source Oral     SpO2 97 %     Weight      Height      Head Circumference      Peak Flow       Pain Score 8     Pain Loc      Pain Edu?      Excl. in [place]?    No data found.  Updated Vital Signs BP 137/77   Pulse (!) 53   Temp (!) 97.5 F (36.4 C) (Oral)   Resp 16   SpO2 97%   Physical Exam Constitutional:      General: He is not in acute distress.    Appearance: He is well-developed. He is not diaphoretic.  HENT:     Head: Normocephalic and atraumatic.  Eyes:     Conjunctiva/sclera: Conjunctivae normal.     Pupils: Pupils are equal, round, and reactive to light.  Cardiovascular:     Rate and Rhythm: Regular rhythm. Bradycardia present.     Heart sounds: Normal heart sounds. No murmur. No friction rub. No gallop.   Pulmonary:     Effort: Pulmonary effort is normal. No accessory muscle usage or respiratory distress.     Breath sounds: Normal breath sounds. No stridor. No decreased breath sounds, wheezing, rhonchi or rales.  Musculoskeletal:     Comments: No tenderness on palpation of the spinous processes. Tenderness to palpation of right lumbar paraspinal area, mostly along L4-L5 region. Full range of motion of back and hip. Strength normal and equal bilaterally. Sensation intact and equal bilaterally. Negative straight leg raise.   Skin:    General: Skin is warm and dry.  Neurological:     Mental Status: He is alert and oriented to person, place, and time.      UC Treatments / Results  Labs (all labs ordered are listed, but only abnormal results are displayed) Labs Reviewed - No data to display  EKG   Radiology No results found.  Procedures Procedures (including critical care time)  Medications Ordered in UC Medications - No data to display  Initial Impression / Assessment and Plan / UC Course  I have reviewed the triage vital signs and the nursing notes.  Pertinent labs & imaging results that were available during my care of the patient were reviewed by me and considered in my  medical decision making (see chart for details).    1. Right lower  back pain without sciatica No alarming signs on exam. Patient with history of insulin-dependent DM, will avoid prednisone for management. History of CKD, creatinine fluctuating 1.4-1.5, will defer oral NSAIDs for now.  Given history and exam more consistent with muscle pain, will defer narcotics. Hesitant to start with muscle relaxant due to age. Will have patient start with Tylenol, Voltaren gel at this time. If no significant pain relief, can try low-dose tizanidine for symptoms. Return precautions given.  2. Bradycardia/1st degree AV block Patient asymptomatic. EKG sinus bradycardia with 1st degree AV block, 51bpm, no acute ST changes, rate slower, otherwise unchanged from prior. Discussed with Dr Lanny Cramp, no need for change of BB at this time. Will have patient call cardiologist of current finding. Return precautions given. Patient expresses understanding and agrees to plan.  Final Clinical Impressions(s) / UC Diagnoses   Final diagnoses:  Acute right-sided low back pain without sciatica  Bradycardia with 51-60 beats per minute   ED Prescriptions    Medication Sig Dispense Auth. Provider   diclofenac Sodium (VOLTAREN) 1 % GEL Apply 4 g topically 4 (four) times daily. 150 g Ok Edwards, PA-C     PDMP not reviewed this encounter.   Ok Edwards, PA-C 01/15/20 1321

## 2020-01-15 NOTE — Discharge Instructions (Addendum)
Back pain No alarming signs on exam. Start voltaren gel (diclofenac) as directed. Tylenol 1000mg  three times a day. Ice compress for the next 2 days, then switch to heat. Call back if you need further pain control. Follow up with PCP/orthopedics if symptoms worsen, changes for reevaluation. If experience numbness/tingling of the inner thighs, loss of bladder or bowel control, go to the emergency department for evaluation.   Bradycardia (slow heart rate) Your heart rate was 53 today. Normal is in the 60s. Your chart shows that your heart rate is normally 60-70s. Your EKG has not changed rhythm wise, just slower rate. Please call your cardiologist on Monday to let him know about your heart rate and see if you need to adjust your medicine. If feeling weak, dizzy, chest pain, shortness of breath, go to the emergency department for further evaluation needed.

## 2020-01-17 ENCOUNTER — Telehealth: Payer: Self-pay | Admitting: Cardiology

## 2020-01-17 NOTE — Telephone Encounter (Signed)
STAT if HR is under 50 or over 120 (normal HR is 60-100 beats per minute)  1) What is your heart rate? 72  2) Do you have a log of your heart rate readings (document readings)? 167/74 HR 72  3) Do you have any other symptoms? No  Pt went to the urgent care for back pain, and at the time of his Urgent Care visit his HR was in the 26s. The Urgent Care doctor told hm to contact his Cardiologist. The pt took his HR and BP on the phone and the HR was up closer to normal for him  Please let the patient know if there is anything else he needs to do

## 2020-01-17 NOTE — Telephone Encounter (Signed)
Spoke with patient. Patient injured his back and went to urgent care. While he was there they told him his heart rate was 41-50bpm and that he should follow up with cardiology. Current HR is 72 with a BP of 167/74. Patient reports his BP is up today but has been controlled when checked recently.   Patient has had no dizziness, lightheadedness or fatigue. Patient denies chest pain. Patient reports he has been feeling fine and was feeling fine besides having back pain when urgent care noticed his HR.   Medication list reviewed with patient. Patient advised to check BP and HR at least 1-2 times a day and log the readings. Patient to call back if he becomes symptomatic or notices that he is trending bradycardic. Patient to also call back if hyper or hypotensive consistently.   Patient verbalized understanding. Will route to MD for review to see if any further recommendations.

## 2020-01-17 NOTE — Telephone Encounter (Signed)
Agree with keeping a BP diary and HR diary.

## 2020-01-24 ENCOUNTER — Telehealth: Payer: Self-pay | Admitting: Emergency Medicine

## 2020-01-24 NOTE — Telephone Encounter (Signed)
Received message that patient had called.  Called phone number in chart, verified patient with 2 identifiers.  Patient reports he wanted a referral for a specialist.  He was not getting any better and actually felt he was worse.  .  Patient called a specialist while waiting for a return call from ucc.  Patient reports he has an appointment tomorrow with specialist.  Patient thanked for call and ended call

## 2020-01-25 DIAGNOSIS — Z6832 Body mass index (BMI) 32.0-32.9, adult: Secondary | ICD-10-CM | POA: Diagnosis not present

## 2020-01-25 DIAGNOSIS — M545 Low back pain: Secondary | ICD-10-CM | POA: Diagnosis not present

## 2020-01-27 DIAGNOSIS — C678 Malignant neoplasm of overlapping sites of bladder: Secondary | ICD-10-CM | POA: Diagnosis not present

## 2020-01-27 DIAGNOSIS — C61 Malignant neoplasm of prostate: Secondary | ICD-10-CM | POA: Diagnosis not present

## 2020-02-01 DIAGNOSIS — N183 Chronic kidney disease, stage 3 unspecified: Secondary | ICD-10-CM | POA: Diagnosis not present

## 2020-02-02 ENCOUNTER — Telehealth: Payer: Self-pay | Admitting: Endocrinology

## 2020-02-02 ENCOUNTER — Other Ambulatory Visit: Payer: Self-pay

## 2020-02-02 ENCOUNTER — Telehealth: Payer: Self-pay

## 2020-02-02 ENCOUNTER — Ambulatory Visit
Admission: RE | Admit: 2020-02-02 | Discharge: 2020-02-02 | Disposition: A | Discharge status: Home / Self Care | Payer: Medicare Other | Source: Ambulatory Visit | Attending: Physician Assistant | Admitting: Physician Assistant

## 2020-02-02 ENCOUNTER — Other Ambulatory Visit: Payer: Self-pay | Admitting: Physician Assistant

## 2020-02-02 DIAGNOSIS — R609 Edema, unspecified: Secondary | ICD-10-CM

## 2020-02-02 DIAGNOSIS — R0602 Shortness of breath: Secondary | ICD-10-CM

## 2020-02-02 MED ORDER — FREESTYLE LITE TEST VI STRP: ORAL_STRIP | 3 refills | Status: DC

## 2020-02-02 MED ORDER — FREESTYLE FREEDOM LITE W/DEVICE KIT: PACK | 0 refills | Status: DC

## 2020-02-02 NOTE — Telephone Encounter (Signed)
He should increase his basal rates by 0.5 throughout the 24 hours.  Has he had any prednisone or cortisone shots?  Also make sure he is changing his infusion set every 3 days

## 2020-02-02 NOTE — Telephone Encounter (Signed)
Pt called and stated that he has been having high blood sugars, and they have not been below 200 in several days. Pt has been in contact with [employer] and [employer] to see if there was an issue with his CGM or pump.  Would you like to advise further?  Pt requested a regular glucometer and test strips to check blood sugar manually to confirm the readings.

## 2020-02-02 NOTE — Telephone Encounter (Signed)
No need to increase basal rates

## 2020-02-02 NOTE — Telephone Encounter (Signed)
Called pt and he stated that [employer] instructed him to change his infusion site. When he removed the old infusion set, the cannula was bent. After putting a new infusion set on, his blood sugar is now 109mg /dL.  Would you still like him to increase the basal rates?  He has not had any steroids of any kind recently.  Pt reports that he is changing his infusion set around every 2 days because his pump keeps prompting him to do so.

## 2020-02-02 NOTE — Telephone Encounter (Signed)
error 

## 2020-02-03 NOTE — Telephone Encounter (Signed)
Called pt and gave him MD message. Pt verbalized understanding. 

## 2020-02-07 DIAGNOSIS — D631 Anemia in chronic kidney disease: Secondary | ICD-10-CM | POA: Diagnosis not present

## 2020-02-07 DIAGNOSIS — N2581 Secondary hyperparathyroidism of renal origin: Secondary | ICD-10-CM | POA: Diagnosis not present

## 2020-02-07 DIAGNOSIS — I129 Hypertensive chronic kidney disease with stage 1 through stage 4 chronic kidney disease, or unspecified chronic kidney disease: Secondary | ICD-10-CM | POA: Diagnosis not present

## 2020-02-07 DIAGNOSIS — N183 Chronic kidney disease, stage 3 unspecified: Secondary | ICD-10-CM | POA: Diagnosis not present

## 2020-02-08 DIAGNOSIS — C61 Malignant neoplasm of prostate: Secondary | ICD-10-CM | POA: Diagnosis not present

## 2020-02-14 DIAGNOSIS — M545 Low back pain: Secondary | ICD-10-CM | POA: Diagnosis not present

## 2020-02-16 DIAGNOSIS — M545 Low back pain: Secondary | ICD-10-CM | POA: Diagnosis not present

## 2020-02-18 ENCOUNTER — Other Ambulatory Visit (INDEPENDENT_AMBULATORY_CARE_PROVIDER_SITE_OTHER): Payer: Medicare Other

## 2020-02-18 ENCOUNTER — Other Ambulatory Visit: Payer: Self-pay

## 2020-02-18 DIAGNOSIS — Z794 Long term (current) use of insulin: Secondary | ICD-10-CM | POA: Diagnosis not present

## 2020-02-18 DIAGNOSIS — E1165 Type 2 diabetes mellitus with hyperglycemia: Secondary | ICD-10-CM | POA: Diagnosis not present

## 2020-02-18 LAB — COMPREHENSIVE METABOLIC PANEL
ALT: 30 U/L (ref 0–53)
AST: 29 U/L (ref 0–37)
Albumin: 4.1 g/dL (ref 3.5–5.2)
Alkaline Phosphatase: 74 U/L (ref 39–117)
BUN: 22 mg/dL (ref 6–23)
CO2: 27 mEq/L (ref 19–32)
Calcium: 9.4 mg/dL (ref 8.4–10.5)
Chloride: 107 mEq/L (ref 96–112)
Creatinine, Ser: 1.63 mg/dL — ABNORMAL HIGH (ref 0.40–1.50)
GFR: 41.84 mL/min — ABNORMAL LOW (ref >60.00)
Glucose, Bld: 116 mg/dL — ABNORMAL HIGH (ref 70–99)
Potassium: 4.1 mEq/L (ref 3.5–5.1)
Sodium: 138 mEq/L (ref 135–145)
Total Bilirubin: 0.9 mg/dL (ref 0.2–1.2)
Total Protein: 6.9 g/dL (ref 6.0–8.3)

## 2020-02-18 LAB — MICROALBUMIN / CREATININE URINE RATIO
Creatinine,U: 95 mg/dL
Microalb Creat Ratio: 201.6 mg/g — ABNORMAL HIGH (ref 0.0–30.0)
Microalb, Ur: 191.5 mg/dL — ABNORMAL HIGH (ref 0.0–1.9)

## 2020-02-18 LAB — HEMOGLOBIN A1C: Hgb A1c MFr Bld: 6.2 % (ref 4.6–6.5)

## 2020-02-21 ENCOUNTER — Ambulatory Visit (INDEPENDENT_AMBULATORY_CARE_PROVIDER_SITE_OTHER): Payer: Medicare Other | Admitting: Endocrinology

## 2020-02-21 ENCOUNTER — Telehealth: Payer: Self-pay | Admitting: Nutrition

## 2020-02-21 ENCOUNTER — Other Ambulatory Visit: Payer: Self-pay

## 2020-02-21 ENCOUNTER — Encounter: Payer: Self-pay | Admitting: Endocrinology

## 2020-02-21 VITALS — BP 110/60 | HR 57 | Temp 98.6°F | Ht 66.0 in | Wt 199.0 lb

## 2020-02-21 DIAGNOSIS — E1165 Type 2 diabetes mellitus with hyperglycemia: Secondary | ICD-10-CM | POA: Diagnosis not present

## 2020-02-21 DIAGNOSIS — I10 Essential (primary) hypertension: Secondary | ICD-10-CM

## 2020-02-21 DIAGNOSIS — E1121 Type 2 diabetes mellitus with diabetic nephropathy: Secondary | ICD-10-CM | POA: Diagnosis not present

## 2020-02-21 DIAGNOSIS — I2510 Atherosclerotic heart disease of native coronary artery without angina pectoris: Secondary | ICD-10-CM

## 2020-02-21 DIAGNOSIS — Z794 Long term (current) use of insulin: Secondary | ICD-10-CM | POA: Diagnosis not present

## 2020-02-21 NOTE — Telephone Encounter (Signed)
Per message from Dr.  Abbott Pao. Was called, and message was left to call me to schedule a time to come in to see me for another pump review.

## 2020-02-21 NOTE — Progress Notes (Signed)
Patient ID: Alec [name], male   DOB: [birth year], 74 y.o.   MRN: [id number]             Reason for Appointment:  Follow-up for Type 2 Diabetes    History of Present Illness:          Diagnosis: Type 2 diabetes mellitus, date of diagnosis: 2001       Prior history: He was seen in consultation in 11/2014 when his A1c was 7.4 Since his blood sugars were averaging over 200 he was given Victoza in addition to his basal bolus insulin regimen in 6/16 He started on the V go pump in October 2016  Recent history:    INSULIN regimen  on T-Slim pump:    BASAL rate settings:  Midnight = 2.5, 3 AM = 2.2, 8 AM = 2.4, 4 PM = 2.7, 7 PM = 2.8 and 10 PM = 2.4  Boluses 12-14 at breakfast, 12 units lunch and 19 at dinner  Recent total daily insulin dose about 126 units  Non-insulin hypoglycemic drugs the patient is taking are: Victoza 1.2 mg daily   His A1c is 6.2 compared to 6.7 done by PCP   Current blood sugar patterns from analysis of his Dexcom sensor patterns, daily management and problems identified:    He is having variable blood sugar patterns from day-to-day and still is not getting time in target about 70%  However hypoglycemia is inconsistent and not at the same time daily  He is tending to have higher fat meals at times and this may cause hyperglycemia  Bolusing timing is usually before meals consistently  He is not aware of doing manual correction boluses for persistently high readings, not able to get the control IQ basal average blood sugar targets some inconsistency in his blood sugars in the last few days  HYPOGLYCEMIA appears to be occurring mostly in the evening after about 8-9 PM  However he has somewhat inconsistent rise in blood sugars in the afternoon  POSTPRANDIAL blood sugars do not show any consistent pattern but sometimes will go up in the evening although not right after eating  Hypoglycemia appears to be minimal with only occasional low normal  readings around 3 AM or yesterday before dinner  AVERAGE blood sugar is 172  HYPERGLYCEMIA above 180 is present 36% of the time currently with 64% readings within range  He is not clear which types of meals makes his blood sugars go up and is usually taking about the same amount of bolus  Side effects from medications have been:none  Glucose monitoring:  Now Dexcom   CONTINUOUS GLUCOSE MONITORING RECORD INTERPRETATION    Dates of Recording: Last 2 weeks  Sensor description: Dexcom G6  Results statistics:   CGM use % of time   Average and SD  162+/-53  Time in range       63%  % Time Above 180  37  % Time above 250   % Time Below target  0   AVERAGE blood sugar highest in the evenings segment after 6 PM of 184  Glycemic patterns summary: Blood sugars are generally very stable overnight but show significant variability during the daytime especially midday and evenings Blood sugars are above target on an average around 1 PM as well as between 5-8 PM No hypoglycemia  Hyperglycemic episodes are occurring mostly midday and evenings around 8-9 PM but very inconsistently.  Some days he has prolonged hyperglycemia for no apparent reason occasionally overnight also  Hypoglycemic episodes not present  Overnight periods: Blood sugars are very well controlled usually except for occasional hyperglycemia  Preprandial periods: On average blood sugars are near normal at breakfast, 170 at lunch and dinner  Postprandial periods:   After breakfast:   Blood sugars are rising modestly but to a variable extent and averaging about 160-180  After lunch: On an average blood sugars are similar to preprandial readings about 170 After dinner: On an average blood sugars are not rising excessively but this is quite variable, sometimes with higher fat meals blood sugars may rise more slowly   Self-care:  Meals: 3 meals per day. Lunch  1 pm Dinner 6 pm Breakfast is usually an egg sandwich but  sometimes biscuits   Eating balanced meals in the evening.  He will have snacks with granola bars              Dietician visit, most recent: 6/15.               Weight history:  Wt Readings from Last 3 Encounters:  02/21/20 199 lb (90.3 kg)  12/21/19 200 lb 9.6 oz (91 kg)  12/14/19 200 lb 12.8 oz (91.1 kg)    Glycemic control:   Lab Results  Component Value Date   HGBA1C 6.2 02/18/2020   HGBA1C 6.7 12/09/2019   HGBA1C 7.3 (H) 11/17/2019   Lab Results  Component Value Date   MICROALBUR 191.5 (H) 02/18/2020   LDLCALC 49 06/16/2019   CREATININE 1.63 (H) 02/18/2020     Past history:  He was not symptomatic at time of diagnosis and was probably treated with Amaryl only.  No details are available of previous management However he thinks he was not given metformin at any time because of his "kidney problem" He had a A1c of over 9% in 2013 and may have been started on insulin at that time. Most likely has been taking Levemir and NovoLog since then A1c records show his results were over 8% in 2014 and in 4/15 but subsequently down to 7.1 in November 2015 Amaryl was stopped in 7/16  Other active problems are addressed in Review of systems   Allergies as of 02/21/2020   No Known Allergies     Medication List       Accurate as of Feb 21, 2020  9:11 AM. If you have any questions, ask your nurse or doctor.        allopurinol 100 MG tablet Commonly known as: ZYLOPRIM Take 200 mg by mouth daily after supper.   amLODipine 10 MG tablet Commonly known as: NORVASC Take 1 tablet (10 mg total) by mouth daily.   aspirin EC 81 MG tablet Take 81 mg by mouth daily.   atorvastatin 40 MG tablet Commonly known as: LIPITOR Take 1 tablet (40 mg total) by mouth daily at 6 PM.   buPROPion 150 MG 24 hr tablet Commonly known as: WELLBUTRIN XL Take 150 mg by mouth daily.   CENTRUM SILVER PO Take by mouth.   CINNAMON PO Take by mouth.   cloNIDine 0.1 MG tablet Commonly known  as: CATAPRES Take 0.1 mg by mouth 2 (two) times daily.   diclofenac Sodium 1 % Gel Commonly known as: VOLTAREN Apply 4 g topically 4 (four) times daily.   folic acid 1 MG tablet Commonly known as: FOLVITE Take 1 tablet (1 mg total) by mouth daily.   FreeStyle Freedom Lite w/Device Kit Use to check blood sugar three times daily. YI:A16.55  FREESTYLE LITE test strip Generic drug: glucose blood USE AS INSTRUCTED TO TEST BLOOD SUGARS 3 TIMES DAILY. DX CODE-E11.65 (PAY FOR 100 IN 90 DAYS)   hydrALAZINE 50 MG tablet Commonly known as: APRESOLINE Take 1 tablet (50 mg total) by mouth 3 (three) times daily.   insulin aspart 100 UNIT/ML injection Commonly known as: novoLOG Inject into the skin. Via pump   insulin lispro 100 UNIT/ML injection Commonly known as: HumaLOG Inject 0.85 mLs (85 Units total) into the skin daily. Use max of 85 units daily via insulin pump.   methotrexate 2.5 MG tablet Commonly known as: RHEUMATREX Take 2.5 mg by mouth once a week. Pt take 5 tablets once a week   metoprolol tartrate 50 MG tablet Commonly known as: LOPRESSOR Take 1 tablet (50 mg total) by mouth 2 (two) times daily.   nitroGLYCERIN 0.4 MG SL tablet Commonly known as: NITROSTAT Place 1 tablet (0.4 mg total) under the tongue every 5 (five) minutes as needed for chest pain.   OSTEO BI-FLEX REGULAR STRENGTH PO Take 1 tablet by mouth daily after supper.   sertraline 50 MG tablet Commonly known as: ZOLOFT Take 25 mg by mouth daily.   TART CHERRY ADVANCED PO Take by mouth.   VITAMIN D3 PO Take by mouth.       Allergies: No Known Allergies  Past Medical History:  Diagnosis Date  . Anxiety   . Arthritis    "maybe in my hands" (07/15/2018)  . Bladder cancer ([place]) 2009  . CKD (chronic kidney disease), stage III   . Coronary artery disease   . Degenerative arthritis of shoulder region 08/2013   left  . Disorder of bursae and tendons in left shoulder region 08/2013  . Gout     "on daily RX" (07/15/2018)  . History of kidney stones   . Hyperlipidemia   . Hypertension    under control with meds., has been on med. > 10 yr.  . IDDM (insulin dependent diabetes mellitus)     Past Surgical History:  Procedure Laterality Date  . CARDIAC CATHETERIZATION  07/15/2018  . CHOLECYSTECTOMY  08/29/2012   Procedure: LAPAROSCOPIC CHOLECYSTECTOMY WITH INTRAOPERATIVE CHOLANGIOGRAM;  Surgeon: Imogene Burn. Tsuei, MD;  Location: [facility];  Service: General;  Laterality: N/A;  . CORONARY ARTERY BYPASS GRAFT N/A 07/20/2018   Procedure: CORONARY ARTERY BYPASS GRAFTING (CABG) times three using left internal mammary artery to the LAD, and using endoscopically harvested right saphenous vein to PDA and intermedius.;  Surgeon: Grace Isaac, MD;  Location: [facility];  Service: Open Heart Surgery;  Laterality: N/A;  . LEFT HEART CATH AND CORONARY ANGIOGRAPHY N/A 07/15/2018   Procedure: LEFT HEART CATH AND CORONARY ANGIOGRAPHY;  Surgeon: Leonie Man, MD;  Location: [facility];  Service: Cardiovascular;  Laterality: N/A;  . SHOULDER ARTHROSCOPY W/ ROTATOR CUFF REPAIR Right 2018  . SHOULDER ARTHROSCOPY WITH SUBACROMIAL DECOMPRESSION, ROTATOR CUFF REPAIR AND BICEP TENDON REPAIR Left 09/03/2013   Procedure: LEFT SHOULDER ARTHROSCOPY WITH EXTENSIVE DEBRIDMENT, DISTAL CLAVICULECTOMY, ROTATOR CUFF REPAIR AND SUBACROMIAL DECOMPRESSION PARTIAL ACRIOMIOPLASTY WITH CORACOACROMIAL RELEASE;  Surgeon: Renette Butters, MD;  Location: [facility];  Service: Orthopedics;  Laterality: Left;  . TEE WITHOUT CARDIOVERSION N/A 07/20/2018   Procedure: TRANSESOPHAGEAL ECHOCARDIOGRAM (TEE);  Surgeon: Grace Isaac, MD;  Location: [facility];  Service: Open Heart Surgery;  Laterality: N/A;  . TRANSURETHRAL RESECTION OF BLADDER TUMOR WITH MITOMYCIN-C  08/24/2008    Family History  Problem Relation Age of Onset  .  Diabetes Mother   . Cancer Father   . Coronary artery disease Father        MI at  age 25.   . Sudden death Son   . CAD Maternal Grandfather        MI in his 57's  . CAD Paternal Grandfather     Social History:  reports that he quit smoking about 22 years ago. His smoking use included cigarettes. He has a 50.00 pack-year smoking history. He has never used smokeless tobacco. He reports previous alcohol use. He reports that he does not use drugs.    Review of Systems      HYPERTENSION: has been treated by his PCP with amlodipine 10 mg, hydralazine and clonidine Blood pressure also followed by cardiologist Blood pressure is the same checked twice in the office today but he reports readings around 202 systolic at home  He is not on ACE or ARB medication  BP Readings from Last 3 Encounters:  02/21/20 110/60  01/15/20 137/77  12/21/19 (!) 128/54          Lipids: he is taking Lipitor 40 mg from his PCP LDL in 11/2019 was 41      Lab Results  Component Value Date   CHOL 104 06/16/2019   HDL 70 06/16/2019   [place] 49 06/16/2019   TRIG 106 06/16/2019   CHOLHDL 3.8 07/16/2018               CKD: He has been followed by nephrologist for diabetic nephropathy.  Recently no changes made Last urine microalbumin ratio is much higher, previously had come back to normal with lisinopril and not clear why he is not taking this  His creatinine is variable but generally stable   Lab Results  Component Value Date   CREATININE 1.63 (H) 02/18/2020   CREATININE 1.58 (H) 11/17/2019   CREATININE 1.51 (H) 11/17/2019   Lab Results  Component Value Date   K 4.1 02/18/2020    Last foot exam in 08/2019  Has neuropathic symptoms controlled with low-dose gabapentin, also on Requip from PCP   Eye exam last:  10/20   Physical Examination:  BP 110/60 (BP Location: Left Arm, Patient Position: Sitting, Cuff Size: Large)   Pulse (!) 57   Temp 98.6 F (37 C) (Oral)   Ht '5\' 6"'$  (1.676 m)   Wt 199 lb (90.3 kg)   SpO2 97%   BMI 32.12 kg/m       ASSESSMENT:  Diabetes  type 2, insulin requiring  See history of present illness for detailed discussion of his current management, blood sugar patterns and problems identified  His A1c is 6.2  Although his A1c is about the best he has had not clear why he still has 37 % of his blood sugars are above the target range  Blood sugars are not consistent at any given time with sporadic episodes of hyperglycemia Some of this is related to higher fat meals He does try to bolus before starting to eat but not able to make many adjustments based on carbohydrate content Also not clear why his pump is not giving the auto correction boluses for high sugars Overnight blood sugars are generally well controlled  He still not able to be making changes in his pump on his own Also not doing any manual correction boluses He has forgotten to use the exercise mode on his pump for increased activity   Renal dysfunction: This has been mild and stable, however he  has significant proteinuria and not clear why he is not on an ACE/ARB drug, followed by nephrologist recently  Blood pressure is low normal today even though he has had some high readings at home Will forward notes to the nephrologist also He will need to compare his home meter to the office readings  PLAN: Basal rates to be continued unchanged He was shown how to put an extra carbohydrates for doing a correction boluses using 1: 50 factor We will try to get the nurse educator to review why his pump is not doing the auto correction boluses Again reminded him to add extra bolus amounts for higher fat meals Also try to use the exercise mode when he is more active He will let us know if he has any persistent high blood sugar Continue Victoza Make sure he changes his infusion set every 3 to 4 days and to replace it if he has persistent hyperglycemia not responding to boluses  Will send report of his renal parameters to nephrologist  Follow-up in 3 months   There are no  Patient Instructions on file for this visit.       Elayne Snare 02/21/2020, 9:11 AM   Note: This office note was prepared with Dragon voice recognition system technology. Any transcriptional errors that result from this process are unintentional.

## 2020-02-22 ENCOUNTER — Other Ambulatory Visit: Payer: Self-pay

## 2020-02-22 ENCOUNTER — Encounter: Payer: Medicare Other | Attending: Endocrinology | Admitting: Nutrition

## 2020-02-22 DIAGNOSIS — E1165 Type 2 diabetes mellitus with hyperglycemia: Secondary | ICD-10-CM | POA: Insufficient documentation

## 2020-02-22 DIAGNOSIS — E1121 Type 2 diabetes mellitus with diabetic nephropathy: Secondary | ICD-10-CM | POA: Insufficient documentation

## 2020-02-22 NOTE — Progress Notes (Signed)
Patient reports several high readings due to bent cannulas.  He was shown different sites to use and told to stay away for hardened areas on his abdomen.  We reviewed high blood sugar protocols,and the need to change his infusion set after one correction, with the blood sugar not coming down, or going higher.   He agreed to do this.  His Control IQ is turned on, and am not sure why he is not receiving auto corrections for high readings.  Message was left with [employer] representative to see where on the download you can see if this is happening, or where on the pump this is showing this.  He was also again show how to give a correction dose when his blood sugars are high.  He re demonstrated when and how to do this X2 for me to assure he is doing this correctly.   He is pleased with his 6.2 HgbA1C and says he is very happy with this pump.   We reviewed changing the cartridge, and he has forgotten that he must "fill canula" after each infusion set change.  He was given another handout on the steps for this.  He had no final questions.

## 2020-02-22 NOTE — Patient Instructions (Addendum)
Use new sites on abdomen and upper buttocks, when changing your infusion sets After doing one high blood sugar correction dose, and it does not come down, change infusion set. Do correction blood sugars when not eating, and blood sugars are high.

## 2020-02-23 DIAGNOSIS — M545 Low back pain: Secondary | ICD-10-CM | POA: Diagnosis not present

## 2020-02-25 ENCOUNTER — Other Ambulatory Visit: Payer: Self-pay

## 2020-02-25 ENCOUNTER — Telehealth: Payer: Self-pay | Admitting: Endocrinology

## 2020-02-25 DIAGNOSIS — M545 Low back pain: Secondary | ICD-10-CM | POA: Diagnosis not present

## 2020-02-25 MED ORDER — INSULIN ASPART 100 UNIT/ML ~~LOC~~ SOLN: SUBCUTANEOUS | 2 refills | Status: DC

## 2020-02-25 NOTE — Telephone Encounter (Signed)
Maximum insulin dose 140 units NovoLog.  Your note says out-of-pocket max

## 2020-02-25 NOTE — Telephone Encounter (Signed)
Rx sent and pt made aware.

## 2020-02-25 NOTE — Telephone Encounter (Signed)
Medication Refill Request  Did you call your pharmacy and request this refill first? Yes  . If patient has not contacted pharmacy first, instruct them to do so for future refills.  . Remind them that contacting the pharmacy for their refill is the quickest method to get the refill.  . Refill policy also stated that it will take anywhere between 24-72 hours to receive the refill.    Name of medication? novolog - patient said he needs novolog and not humalog - said he's always gotten novolog   Is this a 90 day supply? Patient unsure - he would like Alec [name] to call him before this is called in to discuss. [phone]  Name and location of pharmacy?  [id number]  [place], [place] [address]  Phone:  [phone] Fax:  [phone]

## 2020-02-25 NOTE — Telephone Encounter (Signed)
Please verify pt's max out of pocket max. Pt reported that CDE said he was using over 100 units daily. Pt is almost out of insulin and is concerned about getting a refill before the holiday.

## 2020-02-29 DIAGNOSIS — M0609 Rheumatoid arthritis without rheumatoid factor, multiple sites: Secondary | ICD-10-CM | POA: Diagnosis not present

## 2020-03-01 ENCOUNTER — Other Ambulatory Visit: Payer: Self-pay

## 2020-03-01 MED ORDER — INSULIN ASPART 100 UNIT/ML ~~LOC~~ SOLN: SUBCUTANEOUS | 2 refills | Status: DC

## 2020-03-06 DIAGNOSIS — I129 Hypertensive chronic kidney disease with stage 1 through stage 4 chronic kidney disease, or unspecified chronic kidney disease: Secondary | ICD-10-CM | POA: Diagnosis not present

## 2020-03-09 DIAGNOSIS — Z79899 Other long term (current) drug therapy: Secondary | ICD-10-CM | POA: Diagnosis not present

## 2020-03-09 DIAGNOSIS — D72819 Decreased white blood cell count, unspecified: Secondary | ICD-10-CM | POA: Diagnosis not present

## 2020-03-09 DIAGNOSIS — M0609 Rheumatoid arthritis without rheumatoid factor, multiple sites: Secondary | ICD-10-CM | POA: Diagnosis not present

## 2020-03-14 DIAGNOSIS — C61 Malignant neoplasm of prostate: Secondary | ICD-10-CM | POA: Diagnosis not present

## 2020-03-24 ENCOUNTER — Other Ambulatory Visit: Payer: Self-pay

## 2020-03-31 ENCOUNTER — Telehealth: Payer: Self-pay

## 2020-03-31 NOTE — Telephone Encounter (Signed)
FAXED [place]: [employer] (Faxed on 03/31/20) Document: chart notes Other records requested: none at this time.  All above requested information has been faxed successfully to [employer] listed above. Documents and fax confirmation have been placed in the faxed file for future reference.

## 2020-04-10 ENCOUNTER — Telehealth: Payer: Self-pay | Admitting: Cardiology

## 2020-04-10 NOTE — Telephone Encounter (Signed)
Pt c/o swelling: STAT is pt has developed SOB within 24 hours  1) How much weight have you gained and in what time span? Doesn't know if he has gained weight   2) If swelling, where is the swelling located? Legs, Ankles, & Feet   3) Are you currently taking a fluid pill? No   4) Are you currently SOB? Doesn't think so   5) Do you have a log of your daily weights (if so, list)? No   6) Have you gained 3 pounds in a day or 5 pounds in a week? No   7) Have you traveled recently? No   Alec [name] is calling requesting a sooner appt due to swelling. I advised Alec [name] I do not have a sooner appt available before 05/01/20, but that I would send a message. He also states his PCP put him on a medication for 5 days for his swelling, but stated it he couldn't stay on it for longer because it would affect his kidneys. Please advise.

## 2020-04-10 NOTE — Telephone Encounter (Signed)
Returned call to Pt.  Left VM.  Advised that there are many virtual visits available this week with [facility] APP's.  Advised to return call to scheduling if interested and ask for a virtual appt.  Availability with Arnold Long and L. Rosalyn Gess.

## 2020-04-12 NOTE — Telephone Encounter (Signed)
Unfortunately, I am booked the next two days and out of the office for two weeks.  Will need to see APP or wait until I return.

## 2020-04-12 NOTE — Telephone Encounter (Signed)
Spoke with pt, aware of dr hochrein's recommendations. He will wait for the appointment 05-01-20. Patient voiced understanding to call if symptoms get worse or change.

## 2020-04-12 NOTE — Telephone Encounter (Signed)
Patient states that he went to see his PCP last week regarding his lower extremity swelling but his PCP states he can not take any of the diuretics they would normally prescribe because of his reduced kidney function. He is requesting an earlier appointment with Dr. Percival Spanish. Current appt is 05/01/20.  Pt states that when he wakes up his legs feel fine but throughout the day the began to swell. He reports drinking lots of water and a couple sodas per day. His weight has not fluctuated as far as he knows. He states he does not weigh very often but does not think he has gained a significant amount of weight.   His BP at the sleep apnea office yesterday was 148/79. He denies being dizzy or lightheaded. He reports mild SOB on exertion. Advised the patient that he should avoid any additional sodium, processed food and sodas. He should elevate his legs whenever he is at rest. He verbalized understanding.   Patient is not interested in a virtual or APP appointment at this time. I will route to Dr. Percival Spanish for advisement.

## 2020-04-13 ENCOUNTER — Other Ambulatory Visit (HOSPITAL_BASED_OUTPATIENT_CLINIC_OR_DEPARTMENT_OTHER): Payer: Self-pay

## 2020-04-13 DIAGNOSIS — G4710 Hypersomnia, unspecified: Secondary | ICD-10-CM

## 2020-04-13 DIAGNOSIS — R0683 Snoring: Secondary | ICD-10-CM

## 2020-04-13 DIAGNOSIS — R0681 Apnea, not elsewhere classified: Secondary | ICD-10-CM

## 2020-04-17 ENCOUNTER — Encounter: Payer: Self-pay | Admitting: Radiation Oncology

## 2020-04-17 NOTE — Progress Notes (Addendum)
GU Location of Tumor / Histology: prostatic adenocarcinoma  If Prostate Cancer, Gleason Score is (3 + 4) and PSA is (7.27). Prostate volume: 41.80 g.  Alec [name] was diagnosed with prostate cancer in November 2017. He opted for active surveillance. Former patient of Dr. Risa Grill.  Biopsies of prostate (if applicable) revealed:   Past/Anticipated interventions by urology, if any:  TURBT, prostate biopsy, active surveillance, prostate biopsy, referral for consideration of brachytherapy  Past/Anticipated interventions by medical oncology, if any: no  Weight changes, if any: no  Bowel/Bladder complaints, if any: IPSS 17. SHIM 15. Denies dysuria, hematuria, urinary leakage or incontinence. Reports a weak urine stream. Reports nocturia x 1-2.  Denies any bowel complaints.  Nausea/Vomiting, if any: no  Pain issues, if any:  Joint pain related to RA.  SAFETY ISSUES:  Prior radiation? No  Pacemaker/ICD? No but had a triple bypass approximately 1 year ago  Possible current pregnancy? no, male patient  Is the patient on methotrexate? Yes. Receives an infusion every 2-3 months under the care of Dr. Gerilyn Nestle.  Current Complaints / other details:  74 year old male. Married. Lives in [place]. Hx of bladder ca s/p TURBT in February 2009.

## 2020-04-18 ENCOUNTER — Other Ambulatory Visit: Payer: Self-pay | Admitting: Urology

## 2020-04-18 ENCOUNTER — Encounter: Payer: Self-pay | Admitting: Medical Oncology

## 2020-04-18 ENCOUNTER — Telehealth: Payer: Self-pay | Admitting: *Deleted

## 2020-04-18 ENCOUNTER — Other Ambulatory Visit: Payer: Self-pay

## 2020-04-18 ENCOUNTER — Ambulatory Visit
Admission: RE | Admit: 2020-04-18 | Discharge: 2020-04-18 | Disposition: A | Discharge status: Home / Self Care | Payer: Medicare Other | Source: Ambulatory Visit | Attending: Radiation Oncology | Admitting: Radiation Oncology

## 2020-04-18 ENCOUNTER — Encounter: Payer: Self-pay | Admitting: Radiation Oncology

## 2020-04-18 ENCOUNTER — Encounter: Payer: Self-pay | Admitting: Urology

## 2020-04-18 VITALS — BP 151/63 | HR 56 | Temp 98.5°F | Resp 20 | Ht 66.0 in | Wt 198.2 lb

## 2020-04-18 DIAGNOSIS — C61 Malignant neoplasm of prostate: Secondary | ICD-10-CM | POA: Diagnosis not present

## 2020-04-18 DIAGNOSIS — R972 Elevated prostate specific antigen [PSA]: Secondary | ICD-10-CM | POA: Diagnosis not present

## 2020-04-18 DIAGNOSIS — Z8551 Personal history of malignant neoplasm of bladder: Secondary | ICD-10-CM | POA: Diagnosis not present

## 2020-04-18 HISTORY — DX: Malignant neoplasm of prostate: C61

## 2020-04-18 NOTE — Progress Notes (Signed)
Radiation Oncology         [phone] ________________________________  Initial outpatient Consultation  Name: Alec [name] MRN: [id number]  Date: 04/18/2020  DOB: 1946-04-12  WU:[id number], Nathen May, MD  Ardis Hughs, MD   REFERRING PHYSICIAN: Ardis Hughs, MD  DIAGNOSIS: 74 y.o. gentleman with Stage T1c adenocarcinoma of the prostate with Gleason score of 3+4, and PSA of 7.27.    ICD-10-CM   1. Malignant neoplasm of prostate ([place])  C61     HISTORY OF PRESENT ILLNESS: Alec [name] is a 74 y.o. male with a diagnosis of prostate cancer. He also has a remote history of low grade, superficial transitional cell carcinoma of the bladder, for which he underwent transurethral resection of a papillary tumor in 07/2008 with intravesicle mitomycin. There has been no evidence of recurrence since that time with his most recent cystoscopy procedure in 12/2019.  He was initially referred back to Dr. Risa Grill for evaluation of elevated PSA at 5.5 in 05/2016. A prostate biopsy performed on 08/05/2016 revealed 3/12 cores with Gleason 3+3 prostate cancer. After discussion of treatment options, he wisely elected to proceed with active surveillance at that time.   A repeat surveillance biopsy performed on 06/23/2017 showed disease stability with 2/12 cores with Gleason 3+3 disease and PSA stable to slightly decreased at 4.19. A repeat PSA in 09/2017 showed an increase to 6.94, and remained stable elevated at 6.11 on 06/2018. He was lost to follow up at this time, possibly secondary to the Covid-19 pandemic. PSA drawn by his PCP in 06/2019 showed further elevation to 7.95 so he was referred back to [facility] in 12/2019 and saw Dr. Louis Meckel.A repeat PSA in 01/2020 was stable elevated at 7.27 so he elected to proceed with a repeat surveillance transrectal ultrasound with 12 biopsies of the prostate on 03/14/2020.  The prostate volume measured 41.8 cc.  Out of 12 core biopsies, 2 were positive.  The maximum  Gleason score was 3+4, and this was seen in the right mid and right apex, which are the same locations that were positive on prior biopsy.  The patient reviewed the biopsy results with his urologist and he has kindly been referred today for discussion of potential radiation treatment options.   PREVIOUS RADIATION THERAPY: No  PAST MEDICAL HISTORY:  Past Medical History:  Diagnosis Date  . Anxiety   . Arthritis    "maybe in my hands" (07/15/2018)  . Bladder cancer ([place]) 2009  . CKD (chronic kidney disease), stage III   . Coronary artery disease   . Degenerative arthritis of shoulder region 08/2013   left  . Disorder of bursae and tendons in left shoulder region 08/2013  . Gout    "on daily RX" (07/15/2018)  . History of kidney stones   . Hyperlipidemia   . Hypertension    under control with meds., has been on med. > 10 yr.  . IDDM (insulin dependent diabetes mellitus)   . Prostate cancer ([place])       PAST SURGICAL HISTORY: Past Surgical History:  Procedure Laterality Date  . CARDIAC CATHETERIZATION  07/15/2018  . CHOLECYSTECTOMY  08/29/2012   Procedure: LAPAROSCOPIC CHOLECYSTECTOMY WITH INTRAOPERATIVE CHOLANGIOGRAM;  Surgeon: Imogene Burn. Tsuei, MD;  Location: [facility];  Service: General;  Laterality: N/A;  . CORONARY ARTERY BYPASS GRAFT N/A 07/20/2018   Procedure: CORONARY ARTERY BYPASS GRAFTING (CABG) times three using left internal mammary artery to the LAD, and using endoscopically harvested right saphenous vein to PDA and intermedius.;  Surgeon: Grace Isaac, MD;  Location: [facility];  Service: Open Heart Surgery;  Laterality: N/A;  . LEFT HEART CATH AND CORONARY ANGIOGRAPHY N/A 07/15/2018   Procedure: LEFT HEART CATH AND CORONARY ANGIOGRAPHY;  Surgeon: Leonie Man, MD;  Location: [facility];  Service: Cardiovascular;  Laterality: N/A;  . SHOULDER ARTHROSCOPY W/ ROTATOR CUFF REPAIR Right 2018  . SHOULDER ARTHROSCOPY WITH SUBACROMIAL DECOMPRESSION, ROTATOR CUFF  REPAIR AND BICEP TENDON REPAIR Left 09/03/2013   Procedure: LEFT SHOULDER ARTHROSCOPY WITH EXTENSIVE DEBRIDMENT, DISTAL CLAVICULECTOMY, ROTATOR CUFF REPAIR AND SUBACROMIAL DECOMPRESSION PARTIAL ACRIOMIOPLASTY WITH CORACOACROMIAL RELEASE;  Surgeon: Renette Butters, MD;  Location: [facility];  Service: Orthopedics;  Laterality: Left;  . TEE WITHOUT CARDIOVERSION N/A 07/20/2018   Procedure: TRANSESOPHAGEAL ECHOCARDIOGRAM (TEE);  Surgeon: Grace Isaac, MD;  Location: [facility];  Service: Open Heart Surgery;  Laterality: N/A;  . TRANSURETHRAL RESECTION OF BLADDER TUMOR WITH MITOMYCIN-C  08/24/2008    FAMILY HISTORY:  Family History  Problem Relation Age of Onset  . Diabetes Mother   . Lung cancer Mother   . Cancer Father   . Coronary artery disease Father        MI at age 31.   . Lung cancer Father   . Sudden death Son   . CAD Maternal Grandfather        MI in his 12's  . CAD Paternal Grandfather   . Breast cancer Neg Hx   . Colon cancer Neg Hx   . Pancreatic cancer Neg Hx     SOCIAL HISTORY:  Social History   Socioeconomic History  . Marital status: Married    Spouse name: Not on file  . Number of children: Not on file  . Years of education: Not on file  . Highest education level: Not on file  Occupational History  . Not on file  Tobacco Use  . Smoking status: Former Smoker    Packs/day: 2.00    Years: 25.00    Pack years: 50.00    Types: Cigarettes    Quit date: 09/11/1997    Years since quitting: 22.6  . Smokeless tobacco: Never Used  Vaping Use  . Vaping Use: Never used  Substance and Sexual Activity  . Alcohol use: Not Currently    Comment: 07/17/2018 "nothing since the mid 1990s"  . Drug use: Never  . Sexual activity: Yes  Other Topics Concern  . Not on file  Social History Narrative  . Not on file   Social Determinants of Health   Financial Resource Strain:   . Difficulty of Paying Living Expenses:   Food Insecurity:   . Worried About  Charity fundraiser in the Last Year:   . Arboriculturist in the Last Year:   Transportation Needs:   . Film/video editor (Medical):   Marland Kitchen Lack of Transportation (Non-Medical):   Physical Activity:   . Days of Exercise per Week:   . Minutes of Exercise per Session:   Stress:   . Feeling of Stress :   Social Connections:   . Frequency of Communication with Friends and Family:   . Frequency of Social Gatherings with Friends and Family:   . Attends Religious Services:   . Active Member of Clubs or Organizations:   . Attends Archivist Meetings:   Marland Kitchen Marital Status:   Intimate Partner Violence:   . Fear of Current or Ex-Partner:   . Emotionally Abused:   .  Physically Abused:   . Sexually Abused:     ALLERGIES: Patient has no known allergies.  MEDICATIONS:  Current Outpatient Medications  Medication Sig Dispense Refill  . allopurinol (ZYLOPRIM) 100 MG tablet Take 200 mg by mouth daily after supper.    Marland Kitchen amLODipine (NORVASC) 10 MG tablet Take 1 tablet (10 mg total) by mouth daily. 90 tablet 3  . aspirin EC 81 MG tablet Take 81 mg by mouth daily.    Marland Kitchen atorvastatin (LIPITOR) 40 MG tablet Take 1 tablet (40 mg total) by mouth daily at 6 PM. 90 tablet 3  . Blood Glucose Monitoring Suppl (FREESTYLE FREEDOM LITE) w/Device KIT Use to check blood sugar three times daily. DX:E11.65 1 kit 0  . [place]-Glucosamine-Vit D (OSTEO BI-FLEX ONE PER DAY PO) Take by mouth.    . Cholecalciferol (VITAMIN D3 PO) Take by mouth.    Marland Kitchen CINNAMON PO Take by mouth.    . cloNIDine (CATAPRES) 0.1 MG tablet Take 0.1 mg by mouth 2 (two) times daily.    . folic acid (FOLVITE) 1 MG tablet Take 1 tablet (1 mg total) by mouth daily. 90 tablet 3  . Glucosamine-Chondroitin (OSTEO BI-FLEX REGULAR STRENGTH PO) Take 1 tablet by mouth daily after supper.    Marland Kitchen glucose blood (FREESTYLE LITE) test strip USE AS INSTRUCTED TO TEST BLOOD SUGARS 3 TIMES DAILY. DX CODE-E11.65 (PAY FOR 100 IN 90 DAYS) 100 strip 3  .  hydrALAZINE (APRESOLINE) 50 MG tablet Take 1 tablet (50 mg total) by mouth 3 (three) times daily. 270 tablet 3  . insulin aspart (NOVOLOG) 100 UNIT/ML injection Use a max of 140 units daily via insulin pump. 130 mL 2  . metoprolol tartrate (LOPRESSOR) 50 MG tablet Take 1 tablet (50 mg total) by mouth 2 (two) times daily. 90 tablet 3  . Misc Natural Products (TART CHERRY ADVANCED PO) Take by mouth.    . Multiple Vitamins-Minerals (CENTRUM SILVER PO) Take by mouth.    . sertraline (ZOLOFT) 25 MG tablet     . ALBUTEROL SULFATE IN albuterol sulfate HFA 90 mcg/actuation aerosol inhaler  Inhale 2 puffs every 4 hours by inhalation route. (Patient not taking: Reported on 04/18/2020)    . nitroGLYCERIN (NITROSTAT) 0.4 MG SL tablet Place 1 tablet (0.4 mg total) under the tongue every 5 (five) minutes as needed for chest pain. (Patient not taking: Reported on 06/24/2019) 30 tablet 1   No current facility-administered medications for this encounter.    REVIEW OF SYSTEMS:  On review of systems, the patient reports that he is doing well overall. He denies any chest pain, shortness of breath, cough, fevers, chills, night sweats, unintended weight changes. He denies any bowel disturbances, and denies abdominal pain, nausea or vomiting. He denies any new musculoskeletal or joint aches or pains. His IPSS was 17, indicating severe urinary symptoms with a weak urine stream, hesitancy, intermittency and nocturia x1-2 all of which he reports are present since the time of biopsy.  IPSS prior to prostate biopsy was 2, indicating minimal urinary symptoms at baseline. His SHIM was 15, indicating he has moderate erectile dysfunction which responds adequately to Viagra prn. A complete review of systems is obtained and is otherwise negative.    PHYSICAL EXAM:  Wt Readings from Last 3 Encounters:  04/18/20 198 lb 3.2 oz (89.9 kg)  02/21/20 199 lb (90.3 kg)  12/21/19 200 lb 9.6 oz (91 kg)   Temp Readings from Last 3  Encounters:  04/18/20 98.5 F (36.9 C)  02/21/20 98.6 F (37 C) (Oral)  01/15/20 (!) 97.5 F (36.4 C) (Oral)   BP Readings from Last 3 Encounters:  04/18/20 (!) 151/63  02/21/20 110/60  01/15/20 137/77   Pulse Readings from Last 3 Encounters:  04/18/20 (!) 56  02/21/20 (!) 57  01/15/20 (!) 53    /10  In general this is a well appearing Caucasian male in no acute distress. He is alert and oriented x4 and appropriate throughout the examination. HEENT reveals that the patient is normocephalic, atraumatic. EOMs are intact. PERRLA. Skin is intact without any evidence of gross lesions. Cardiopulmonary assessment is negative for acute distress and he exhibits normal effort. The abdomen is soft, non tender, non distended. Lower extremities are negative for pretibial pitting edema, deep calf tenderness, cyanosis or clubbing.   KPS = 90  100 - Normal; no complaints; no evidence of disease. 90   - Able to carry on normal activity; minor signs or symptoms of disease. 80   - Normal activity with effort; some signs or symptoms of disease. 53   - Cares for self; unable to carry on normal activity or to do active work. 60   - Requires occasional assistance, but is able to care for most of his personal needs. 50   - Requires considerable assistance and frequent medical care. 81   - Disabled; requires special care and assistance. 83   - Severely disabled; hospital admission is indicated although death not imminent. 53   - Very sick; hospital admission necessary; active supportive treatment necessary. 10   - Moribund; fatal processes progressing rapidly. 0     - Dead  Karnofsky DA, Abelmann [place], Craver LS and Burchenal [facility] [phone]) The use of the nitrogen mustards in the palliative treatment of carcinoma: with particular reference to bronchogenic carcinoma Cancer 1 634-56  LABORATORY DATA:  Lab Results  Component Value Date   WBC 6.6 07/23/2018   HGB 10.3 (L) 07/23/2018   HCT 30.2 (L) 07/23/2018    MCV 95.6 07/23/2018   PLT 125 (L) 07/23/2018   Lab Results  Component Value Date   NA 138 02/18/2020   K 4.1 02/18/2020   CL 107 02/18/2020   CO2 27 02/18/2020   Lab Results  Component Value Date   ALT 30 02/18/2020   AST 29 02/18/2020   ALKPHOS 74 02/18/2020   BILITOT 0.9 02/18/2020     RADIOGRAPHY: No results found.    IMPRESSION/PLAN: 1. 74 y.o. gentleman with Stage T1c adenocarcinoma of the prostate with Gleason Score of 3+4, and PSA of 7.27. We discussed the patient's workup and outlined the nature of prostate cancer in this setting. The patient's T stage, Gleason's score, and PSA put him into the favorable intermediate risk group. Accordingly, he is eligible for a variety of potential treatment options including brachytherapy, 5.5 weeks of external radiation, or prostatectomy. We discussed the available radiation techniques, and focused on the details and logistics of delivery. We discussed and outlined the risks, benefits, short and long-term effects associated with radiotherapy and compared and contrasted these with prostatectomy. We discussed the role of SpaceOAR in reducing the rectal toxicity associated with radiotherapy. He was encouraged to ask questions that were answered to his stated satisfaction.  At the conclusion of our conversation the patient is interested in moving forward with brachytherapy and use of SpaceOAR to reduce rectal toxicity from radiotherapy.  He appears to have a good understanding of his disease and our treatment recommendations which are of curative intent.  We will share our discussion with Dr. Louis Meckel and move forward with scheduling his [facility] planning appointment in the near future.  The patient met briefly with Romie Jumper in our office who will be working closely with him to coordinate OR scheduling and pre and post procedure appointments.  We will contact the pharmaceutical rep to ensure that [facility] is available at the time of procedure.   We enjoyed meeting with him today and look forward to continuing to participate in his care.    Nicholos Johns, PA-C    Tyler Pita, MD  [facility] Direct Dial: [phone]  Fax: [phone] [email or web address]  Skype  [email or web address]   This document serves as a record of services personally performed by Tyler Pita, MD and Freeman Caldron, PA-C. It was created on their behalf by Wilburn Mylar, a trained medical scribe. The creation of this record is based on the scribe's personal observations and the provider's statements to them. This document has been checked and approved by the attending provider.

## 2020-04-18 NOTE — Telephone Encounter (Signed)
Called patient to inform of pre-seed appts. and implant, lvm for a return call

## 2020-04-18 NOTE — Progress Notes (Signed)
Introduced myself to patient and his wife as the prostate nurse navigator and discussed my role. He is here today to discuss his radiation options but after discussions with Dr. Louis Meckel and research, he is leaning towards brachytherapy. We discussed the process and the appointments to be scheduled. I gave them my business card and asked them to call me with questions or concerns.

## 2020-04-19 ENCOUNTER — Telehealth: Payer: Self-pay | Admitting: *Deleted

## 2020-04-19 NOTE — Telephone Encounter (Signed)
CALLED PATIENT TO INFORM OF PRE-SEED APPTS. AND IMPLANT, SPOKE WITH PATIENT AND HE IS AWARE OF THESE APPTS. 

## 2020-04-25 DIAGNOSIS — M0609 Rheumatoid arthritis without rheumatoid factor, multiple sites: Secondary | ICD-10-CM | POA: Diagnosis not present

## 2020-04-26 ENCOUNTER — Other Ambulatory Visit: Payer: Self-pay

## 2020-04-26 MED ORDER — INSULIN LISPRO 100 UNIT/ML ~~LOC~~ SOLN: SUBCUTANEOUS | 1 refills | Status: DC

## 2020-04-30 ENCOUNTER — Encounter: Payer: Self-pay | Admitting: Cardiology

## 2020-04-30 DIAGNOSIS — M7989 Other specified soft tissue disorders: Secondary | ICD-10-CM | POA: Insufficient documentation

## 2020-04-30 DIAGNOSIS — R001 Bradycardia, unspecified: Secondary | ICD-10-CM | POA: Insufficient documentation

## 2020-04-30 NOTE — Progress Notes (Signed)
Cardiology Office Note   Date:  05/01/2020   ID:  Alec [name], DOB 1946-04-01, MRN [id number]  PCP:  Mayra Neer, MD  Cardiologist:   Minus Breeding, MD   Chief Complaint  Patient presents with  . Edema      History of Present Illness: Alec [name] is a 74 y.o. male who presents for follow up of CAD status post CABG.  Since I last saw him he has called because of low heart rate, increased BP and lower extremity swelling.  He has multiple complaints.  He has had some problems with his blood pressure cuff not being accurate but he says he thinks his blood pressure is okay at home but it is elevated when he comes into the office like today.  I reviewed the readings in various offices and it fluctuating between 350 and 093 systolic.  He has had some lower heart rates but these do not particularly bother him.  He has some tachypalpitations occasionally it seems to be short-lived.  Has not had any presyncope or syncope.  He has not had any chest discomfort, neck or arm discomfort.  He does have some shortness of breath with activity but is not being particularly active.  He denies any PND or orthopnea.  He does have some lower extremity swelling that seems to come and go.  He took some Lasix that he was given for short course.  Past Medical History:  Diagnosis Date  . Anxiety   . Arthritis   . Bladder cancer ([place]) 2009  . CKD (chronic kidney disease), stage III   . Coronary artery disease   . Degenerative arthritis of shoulder region 08/2013   left  . Disorder of bursae and tendons in left shoulder region 08/2013  . Gout    "on daily RX" (07/15/2018)  . History of kidney stones   . Hyperlipidemia   . Hypertension    under control with meds., has been on med. > 10 yr.  . IDDM (insulin dependent diabetes mellitus)   . Prostate cancer [facility])     Past Surgical History:  Procedure Laterality Date  . CARDIAC CATHETERIZATION  07/15/2018  . CHOLECYSTECTOMY  08/29/2012    Procedure: LAPAROSCOPIC CHOLECYSTECTOMY WITH INTRAOPERATIVE CHOLANGIOGRAM;  Surgeon: Imogene Burn. Tsuei, MD;  Location: [facility];  Service: General;  Laterality: N/A;  . CORONARY ARTERY BYPASS GRAFT N/A 07/20/2018   Procedure: CORONARY ARTERY BYPASS GRAFTING (CABG) times three using left internal mammary artery to the LAD, and using endoscopically harvested right saphenous vein to PDA and intermedius.;  Surgeon: Grace Isaac, MD;  Location: [place];  Service: Open Heart Surgery;  Laterality: N/A;  . LEFT HEART CATH AND CORONARY ANGIOGRAPHY N/A 07/15/2018   Procedure: LEFT HEART CATH AND CORONARY ANGIOGRAPHY;  Surgeon: Leonie Man, MD;  Location: [place] [facility];  Service: Cardiovascular;  Laterality: N/A;  . SHOULDER ARTHROSCOPY W/ ROTATOR CUFF REPAIR Right 2018  . SHOULDER ARTHROSCOPY WITH SUBACROMIAL DECOMPRESSION, ROTATOR CUFF REPAIR AND BICEP TENDON REPAIR Left 09/03/2013   Procedure: LEFT SHOULDER ARTHROSCOPY WITH EXTENSIVE DEBRIDMENT, DISTAL CLAVICULECTOMY, ROTATOR CUFF REPAIR AND SUBACROMIAL DECOMPRESSION PARTIAL ACRIOMIOPLASTY WITH CORACOACROMIAL RELEASE;  Surgeon: Renette Butters, MD;  Location: [place];  Service: Orthopedics;  Laterality: Left;  . TEE WITHOUT CARDIOVERSION N/A 07/20/2018   Procedure: TRANSESOPHAGEAL ECHOCARDIOGRAM (TEE);  Surgeon: Grace Isaac, MD;  Location: [place];  Service: Open Heart Surgery;  Laterality: N/A;  . TRANSURETHRAL RESECTION OF BLADDER TUMOR WITH  MITOMYCIN-C  08/24/2008     Current Outpatient Medications  Medication Sig Dispense Refill  . allopurinol (ZYLOPRIM) 100 MG tablet Take 200 mg by mouth daily after supper.    Marland Kitchen amLODipine (NORVASC) 10 MG tablet Take 1 tablet (10 mg total) by mouth daily. 90 tablet 3  . aspirin EC 81 MG tablet Take 81 mg by mouth daily.    Marland Kitchen atorvastatin (LIPITOR) 40 MG tablet Take 1 tablet (40 mg total) by mouth daily at 6 PM. 90 tablet 3  . Blood Glucose Monitoring Suppl (FREESTYLE FREEDOM LITE)  w/Device KIT Use to check blood sugar three times daily. DX:E11.65 1 kit 0  . [place]-Glucosamine-Vit D (OSTEO BI-FLEX ONE PER DAY PO) Take by mouth.    . Cholecalciferol (VITAMIN D3 PO) Take by mouth.    Marland Kitchen CINNAMON PO Take by mouth.    . cloNIDine (CATAPRES) 0.1 MG tablet Take 0.1 mg by mouth 2 (two) times daily.    . folic acid (FOLVITE) 1 MG tablet Take 1 tablet (1 mg total) by mouth daily. 90 tablet 3  . Glucosamine-Chondroitin (OSTEO BI-FLEX REGULAR STRENGTH PO) Take 1 tablet by mouth daily after supper.    Marland Kitchen glucose blood (FREESTYLE LITE) test strip USE AS INSTRUCTED TO TEST BLOOD SUGARS 3 TIMES DAILY. DX CODE-E11.65 (PAY FOR 100 IN 90 DAYS) 100 strip 3  . inFLIXimab (REMICADE) 100 MG injection Inject into the vein.    Marland Kitchen insulin lispro (HUMALOG) 100 UNIT/ML injection Use max of 140 units daily via insulin pump. 130 mL 1  . metoprolol tartrate (LOPRESSOR) 50 MG tablet Take 1 tablet (50 mg total) by mouth 2 (two) times daily. 90 tablet 3  . Misc Natural Products (TART CHERRY ADVANCED PO) Take by mouth.    . Multiple Vitamins-Minerals (CENTRUM SILVER PO) Take by mouth.    . sertraline (ZOLOFT) 25 MG tablet     . furosemide (LASIX) 20 MG tablet Take 1 tablet (20 mg total) by mouth as needed for edema. 30 tablet 4  . hydrALAZINE (APRESOLINE) 50 MG tablet Take 1 tablet (50 mg total) by mouth 3 (three) times daily. 270 tablet 3  . nitroGLYCERIN (NITROSTAT) 0.4 MG SL tablet Place 1 tablet (0.4 mg total) under the tongue every 5 (five) minutes as needed for chest pain. (Patient not taking: Reported on 06/24/2019) 30 tablet 1   No current facility-administered medications for this visit.    Allergies:   Patient has no known allergies.    ROS:  Please see the history of present illness.   Otherwise, review of systems are positive for resting tremor, fatigue.   All other systems are reviewed and negative.    PHYSICAL EXAM: VS:  BP (!) 158/60 (BP Location: Left Arm, Patient Position: Sitting,  Cuff Size: Large)   Pulse 59   Ht '5\' 6"'$  (1.676 m)   Wt 200 lb 12.8 oz (91.1 kg)   SpO2 95%   BMI 32.41 kg/m  , BMI Body mass index is 32.41 kg/m. GENERAL:  Well appearing NECK:  No jugular venous distention, waveform within normal limits, carotid upstroke brisk and symmetric, no bruits, no thyromegaly LUNGS:  Clear to auscultation bilaterally CHEST:  Well healed sternotomy scar. HEART:  PMI not displaced or sustained,S1 and S2 within normal limits, no S3, no S4, no clicks, no rubs, no murmurs ABD:  Flat, positive bowel sounds normal in frequency in pitch, no bruits, no rebound, no guarding, no midline pulsatile mass, no hepatomegaly, no splenomegaly EXT:  2  plus pulses throughout, mild foot edema, no cyanosis no clubbing   EKG:  EKG is not ordered today.    Recent Labs: 06/16/2019: TSH 3.55 02/18/2020: ALT 30; BUN 22; Creatinine, Ser 1.63; Potassium 4.1; Sodium 138    Lipid Panel    Component Value Date/Time   CHOL 104 06/16/2019 0000   TRIG 106 06/16/2019 0000   HDL 70 06/16/2019 0000   CHOLHDL 3.8 07/16/2018 0731   VLDL 24 07/16/2018 0731   LDLCALC 49 06/16/2019 0000      Wt Readings from Last 3 Encounters:  05/01/20 200 lb 12.8 oz (91.1 kg)  04/18/20 198 lb 3.2 oz (89.9 kg)  02/21/20 199 lb (90.3 kg)      Other studies Reviewed: Additional studies/ records that were reviewed today include: CareEverywhere. Review of the above records demonstrates:  Please see elsewhere in the note.     ASSESSMENT AND PLAN:  LEG SWELLING:   He has some mild leg swelling that might be related to his Norvasc as well as salt intake.  I will give him Lasix 20 mg as needed but he understands he should take this sparingly and let us know if he is using it frequently because we would need to watch his renal insufficiency.  I am going to give him a flyer to get his compression stockings to use as needed.  BRADYCARDIA: This is not problematic.  In fact he has some tachypalpitations we  talked about as needed taking an extra 25 mg of metoprolol if he is having lots of palpitations.  CAD:    He has some dyspnea but I do not think this is an anginal equivalent.  I think this is probably related to some weight and deconditioning as his not exercising.  Of asked him to do some walking.   HTN:  The blood pressure is fluctuating.  He says his blood pressure cuffs have not been accurately when he has used a home blood pressure cuff..  I asked him to try again so that we get a home blood pressure diary.  Also he supposed to be taking his clonidine twice daily and I clarified this.   HYPERLIPIDEMIA:   LDL was 41.  HDL 28.  No change in therapy.   SLEEP APNEA: He is not doing well with CPAP but he is going to get an sleep study soon.  DM:   A1C was his A1c is down to 6.2.  No change in therapy.   CKD III:   Creat is 1.63.  He is followed by nephrology.   COVID EDUCATION:    He has been vaccinated.   Current medicines are reviewed at length with the patient today.  The patient does not have concerns regarding medicines.  The following changes have been made:  As above  Labs/ tests ordered today include: None  No orders of the defined types were placed in this encounter.    Disposition:   FU with APP in six months.     Signed, Minus Breeding, MD  05/01/2020 9:41 AM    [facility]

## 2020-05-01 ENCOUNTER — Ambulatory Visit (INDEPENDENT_AMBULATORY_CARE_PROVIDER_SITE_OTHER): Payer: Medicare Other | Admitting: Cardiology

## 2020-05-01 ENCOUNTER — Telehealth: Payer: Self-pay | Admitting: Cardiology

## 2020-05-01 ENCOUNTER — Other Ambulatory Visit: Payer: Self-pay

## 2020-05-01 ENCOUNTER — Encounter: Payer: Self-pay | Admitting: Cardiology

## 2020-05-01 VITALS — BP 158/60 | HR 59 | Ht 66.0 in | Wt 200.8 lb

## 2020-05-01 DIAGNOSIS — E118 Type 2 diabetes mellitus with unspecified complications: Secondary | ICD-10-CM

## 2020-05-01 DIAGNOSIS — I2510 Atherosclerotic heart disease of native coronary artery without angina pectoris: Secondary | ICD-10-CM

## 2020-05-01 DIAGNOSIS — M7989 Other specified soft tissue disorders: Secondary | ICD-10-CM | POA: Diagnosis not present

## 2020-05-01 DIAGNOSIS — Z794 Long term (current) use of insulin: Secondary | ICD-10-CM

## 2020-05-01 DIAGNOSIS — N1831 Chronic kidney disease, stage 3a: Secondary | ICD-10-CM

## 2020-05-01 DIAGNOSIS — I10 Essential (primary) hypertension: Secondary | ICD-10-CM | POA: Diagnosis not present

## 2020-05-01 DIAGNOSIS — R001 Bradycardia, unspecified: Secondary | ICD-10-CM

## 2020-05-01 MED ORDER — FUROSEMIDE 20 MG PO TABS
20.0000 mg | ORAL_TABLET | ORAL | 4 refills | Status: DC | PRN
Start: 2020-05-01 — End: 2020-08-03

## 2020-05-01 NOTE — Telephone Encounter (Signed)
Patient is calling to follow up with Dr. Rosezella [place] nurse in regards to cloNIDine (CATAPRES) 0.1 MG tablet medication. He states he would like to clarify dosage instructions that are on the pill bottle, as discussed during appointment today, 05/19/20 at 9:00 AM. Please call.

## 2020-05-01 NOTE — Telephone Encounter (Signed)
Called patient- he states he just wants to make Dr.Hochrein aware that his last pill bottle for this medication was 1 tablet a day. He will take it as discussed at visit today, he just wanted to make him aware.   Thank you!

## 2020-05-01 NOTE — Patient Instructions (Addendum)
Medication Instructions:   START Lasix 20 mg as needed for swelling or shortness of breath  May take an extra 25 mg tablet of Metoprolol Tartrate for palpitations  *If you need a refill on your cardiac medications before your next appointment, please call your pharmacy*  Lab Work: NONE ordered at this time of appointment   If you have labs (blood work) drawn today and your tests are completely normal, you will receive your results only by: Marland Kitchen MyChart Message (if you have MyChart) OR . A paper copy in the mail If you have any lab test that is abnormal or we need to change your treatment, we will call you to review the results.  Testing/Procedures: NONE ordered at this time of appointment   Follow-Up: At [facility], you and your health needs are our priority.  As part of our continuing mission to provide you with exceptional heart care, we have created designated Provider Care Teams.  These Care Teams include your primary Cardiologist (physician) and Advanced Practice Providers (APPs -  Physician Assistants and Nurse Practitioners) who all work together to provide you with the care you need, when you need it.  Your next appointment:   6 month(s)  The format for your next appointment:   In Person  Provider:   Rosaria Ferries, PA-C, Jory Sims, DNP, ANP or Almyra Deforest, PA-C, Kerin Ransom, PA-C  Other Instructions  Flyer for compression stockings   Give our a call back to verify the directions on bottle for Clonidine

## 2020-05-18 ENCOUNTER — Ambulatory Visit (HOSPITAL_BASED_OUTPATIENT_CLINIC_OR_DEPARTMENT_OTHER): Payer: Medicare Other | Attending: Internal Medicine | Admitting: Internal Medicine

## 2020-05-18 ENCOUNTER — Other Ambulatory Visit: Payer: Self-pay

## 2020-05-18 DIAGNOSIS — G4733 Obstructive sleep apnea (adult) (pediatric): Secondary | ICD-10-CM | POA: Diagnosis not present

## 2020-05-18 DIAGNOSIS — G4710 Hypersomnia, unspecified: Secondary | ICD-10-CM

## 2020-05-18 DIAGNOSIS — R0683 Snoring: Secondary | ICD-10-CM

## 2020-05-18 DIAGNOSIS — R0681 Apnea, not elsewhere classified: Secondary | ICD-10-CM

## 2020-05-21 DIAGNOSIS — G4710 Hypersomnia, unspecified: Secondary | ICD-10-CM | POA: Diagnosis not present

## 2020-05-21 NOTE — Procedures (Signed)
   NAME: Alec [name] DATE OF BIRTH:  05/03/1946 MEDICAL RECORD NUMBER [id number]  LOCATION: [facility]  PHYSICIAN: Marius Ditch  DATE OF STUDY: 05/18/2020  SLEEP STUDY TYPE: Nocturnal Polysomnogram               REFERRING PHYSICIAN: Marius Ditch, MD  INDICATION FOR STUDY: Severe OSA on HSAT and failure to attain compliance on initial APAP period. Study done to resume CPAP.   EPWORTH SLEEPINESS SCORE:  14 HEIGHT: 5\' 6"  (167.6 cm)  WEIGHT: 200 lb (90.7 kg)    Body mass index is 32.28 kg/m.  NECK SIZE: 18 in.  MEDICATIONS Patient self administered medications include: N/A. Medications administered during study include No sleep medicine administered.Marland Kitchen  SLEEP STUDY TECHNIQUE A multi-channel overnight Polysomnography study was performed. The channels recorded and monitored were central and occipital EEG, electrooculogram (EOG), submentalis EMG (chin), nasal and oral airflow, thoracic and abdominal wall motion, anterior tibialis EMG, snore microphone, electrocardiogram, and a pulse oximetry.  TECHNICAL COMMENTS Comments added by Technician: Patient had difficulty initiating sleep. Patient was restless all through the night. PATIENT DID NOT SLEEP GOOD Comments added by Scorer: N/A  SLEEP ARCHITECTURE The study was initiated at 10:34:08 PM and terminated at 4:45:20 AM. The total recorded time was 371.2 minutes. EEG confirmed total sleep time was 202.5 minutes yielding a sleep efficiency of 54.6%%. Sleep onset after lights out was 21.4 minutes with a REM latency of 90.5 minutes. The patient spent 6.9%% of the night in stage N1 sleep, 90.9%% in stage N2 sleep, 0.0%% in stage N3 and 2.2% in REM. Wake after sleep onset (WASO) was 147.3 minutes. The Arousal Index was 1.5/hour.  RESPIRATORY PARAMETERS There were a total of 11 respiratory disturbances out of which 8 were apneas ( 6 obstructive, 0 mixed, 2 central) and 3 hypopneas. The apnea/hypopnea index (AHI) was  3.3 events/hour. The central sleep apnea index was 0.6 events/hour. The REM AHI was 0.0 events/hour and NREM AHI was 3.3 events/hour. The supine AHI was 4.5 events/hour and the non supine AHI was 2.5 events/hour. Respiratory disturbance index was 3.6 events/hour and abesnt in REM sleep. Respiratory disturbances were associated with oxygen desaturation down to a nadir of 88.0% during sleep. The mean oxygen saturation during the study was 91.8%. The cumulative time under 88% oxygen saturation was 5.5 minutes.  LEG MOVEMENT DATA The total leg movements were 0 with a resulting leg movement index of 0.0/hr . Associated arousal with leg movement index was 0.0/hr.  CARDIAC DATA The underlying cardiac rhythm was most consistent with sinus rhythm. Mean heart rate during sleep was 47.6 bpm. Additional rhythm abnormalities include None.  IMPRESSIONS - No Significant Obstructive Sleep apnea(OSA). This is very odd given severe OSA on home sleep apnea testing.  - On potential explanation is that he slept poorly during this study  DIAGNOSIS - Excessive daytime sleepiness  RECOMMENDATIONS - Consider repeat PSG or doing an ONO or repeat HSAT through the office.   Marius Ditch Sleep specialist, [place] Board of Internal Medicine  ELECTRONICALLY SIGNED ON:  05/21/2020, 1:35 PM [place] PH: [phone]   FX: [phone] [place]

## 2020-05-22 ENCOUNTER — Other Ambulatory Visit (INDEPENDENT_AMBULATORY_CARE_PROVIDER_SITE_OTHER): Payer: Medicare Other

## 2020-05-22 ENCOUNTER — Other Ambulatory Visit: Payer: Self-pay

## 2020-05-22 DIAGNOSIS — E1165 Type 2 diabetes mellitus with hyperglycemia: Secondary | ICD-10-CM | POA: Diagnosis not present

## 2020-05-22 DIAGNOSIS — Z794 Long term (current) use of insulin: Secondary | ICD-10-CM

## 2020-05-22 LAB — BASIC METABOLIC PANEL
BUN: 19 mg/dL (ref 6–23)
CO2: 25 mEq/L (ref 19–32)
Calcium: 9.2 mg/dL (ref 8.4–10.5)
Chloride: 107 mEq/L (ref 96–112)
Creatinine, Ser: 1.82 mg/dL — ABNORMAL HIGH (ref 0.40–1.50)
GFR: 36.82 mL/min — ABNORMAL LOW (ref >60.00)
Glucose, Bld: 97 mg/dL (ref 70–99)
Potassium: 4.1 mEq/L (ref 3.5–5.1)
Sodium: 141 mEq/L (ref 135–145)

## 2020-05-22 LAB — HEMOGLOBIN A1C: Hgb A1c MFr Bld: 6.1 % (ref 4.6–6.5)

## 2020-05-23 ENCOUNTER — Other Ambulatory Visit: Payer: Self-pay

## 2020-05-23 ENCOUNTER — Telehealth: Payer: Self-pay | Admitting: Cardiology

## 2020-05-23 ENCOUNTER — Encounter: Payer: Self-pay | Admitting: Endocrinology

## 2020-05-23 MED ORDER — CLONIDINE HCL 0.1 MG PO TABS: 0.1000 mg | ORAL_TABLET | Freq: Two times a day (BID) | ORAL | 10 refills | Status: DC

## 2020-05-23 NOTE — Telephone Encounter (Signed)
   *  STAT* If patient is at the pharmacy, call can be transferred to refill team.   1. Which medications need to be refilled? (please list name of each medication and dose if known) cloNIDine (CATAPRES) 0.1 MG tablet  2. Which pharmacy/location (including street and city if local pharmacy) is medication to be sent to? [place] ([place]), [place] - [address]  3. Do they need a 30 day or 90 day supply? 90 days   Pt said Dr, Percival Spanish increased his dosage to 2 tablets a day. Pharmacy wont refill it pls sent new prescription with script to take 2 tablets a day. Pt is out of medications

## 2020-05-24 ENCOUNTER — Other Ambulatory Visit: Payer: Self-pay

## 2020-05-24 ENCOUNTER — Encounter: Payer: Self-pay | Admitting: Endocrinology

## 2020-05-24 ENCOUNTER — Ambulatory Visit (INDEPENDENT_AMBULATORY_CARE_PROVIDER_SITE_OTHER): Payer: Medicare Other | Admitting: Endocrinology

## 2020-05-24 ENCOUNTER — Telehealth: Payer: Self-pay | Admitting: *Deleted

## 2020-05-24 VITALS — BP 150/60 | HR 56 | Ht 66.0 in | Wt 204.4 lb

## 2020-05-24 DIAGNOSIS — I2510 Atherosclerotic heart disease of native coronary artery without angina pectoris: Secondary | ICD-10-CM

## 2020-05-24 DIAGNOSIS — E1165 Type 2 diabetes mellitus with hyperglycemia: Secondary | ICD-10-CM | POA: Diagnosis not present

## 2020-05-24 DIAGNOSIS — Z794 Long term (current) use of insulin: Secondary | ICD-10-CM | POA: Diagnosis not present

## 2020-05-24 DIAGNOSIS — N183 Chronic kidney disease, stage 3 unspecified: Secondary | ICD-10-CM

## 2020-05-24 NOTE — Patient Instructions (Signed)
Cut back on high fat and high Carb foods

## 2020-05-24 NOTE — Telephone Encounter (Signed)
CALLED PATIENT TO REMIND OF PRE-SEED APPTS. FOR 05-25-20, SPOKE WITH PATIENT AND HE IS AWARE OF THESE APPTS.

## 2020-05-24 NOTE — Progress Notes (Signed)
Patient ID: Alec [name], male   DOB: December 13, 1945, 74 y.o.   MRN: [id number]             Reason for Appointment:  Follow-up for Type 2 Diabetes    History of Present Illness:          Diagnosis: Type 2 diabetes mellitus, date of diagnosis: 2001       Prior history: He was seen in consultation in 11/2014 when his A1c was 7.4 Since his blood sugars were averaging over 200 he was given Victoza in addition to his basal bolus insulin regimen in 6/16 He started on the V go pump in October 2016  Recent history:    INSULIN regimen  on T-Slim pump:    BASAL rate settings:  Midnight = 2.5, 3 AM = 2.2, 8 AM = 2.4, 4 PM = 2.7, 7 PM = 2.8 and 10 PM = 2.4  Boluses 12-14 at breakfast, 12 units lunch and 19 at dinner  Recent total daily insulin dose about 126 units  Non-insulin hypoglycemic drugs the patient is taking are: Victoza 1.2 mg daily   His A1c is very stable at 6.1%   Current blood sugar patterns from analysis of his Dexcom sensor patterns, daily management and problems identified:    His Dexcom data is as below  Is having an average blood sugar below his previous visit  Also time in TARGET is much better  He does have very stable blood sugars in the early morning hours before waking up  However has more VARIABILITY late in the evening after 10 PM until at least 3 AM  Also has periods of HYPERGLYCEMIA midmorning and before noon until about 1 PM  He has only transient mild low normal sugars in the mid afternoon, this may be related to increased physical activity when he does not use the exercise mode  His postprandial readings are somewhat inconsistent but may depend on his judging his carbohydrate coverage  Only once he has had significant persistent hyperglycemia related to an infusion cannula being bent, not recently  Also not clear why he occasionally has unusually high readings late at night, may need to take occasional correction boluses for this  Has no  difficulties with the sensor function and accuracy  Side effects from medications have been:none  Glucose monitoring:  Dexcom  CGM use % of time  97  2-week average/SD  144+/-42  Time in range       81%, was 63  % Time Above 180  18  % Time above 250   % Time Below 70 1     PRE-MEAL  overnight  mornings  afternoon  evening Overall  Glucose range:       Averages:  132  142  149+/-43  152+/-40    PREVIOUS data:    CGM use % of time   Average and SD  162+/-53  Time in range       63%  % Time Above 180  37  % Time above 250   % Time Below target  0   AVERAGE blood sugar highest in the evenings segment after 6 PM of 184  Glycemic patterns summary: Blood sugars are generally very stable overnight but show significant variability during the daytime especially midday and evenings Blood sugars are above target on an average around 1 PM as well as between 5-8 PM    Self-care:  Meals: 3 meals per day. Lunch  1 pm Dinner 6  pm Breakfast is usually an egg sandwich but sometimes biscuits   Eating balanced meals in the evening.  He will have snacks with granola bars              Dietician visit, most recent: 6/15.               Weight history:  Wt Readings from Last 3 Encounters:  05/24/20 204 lb 6.4 oz (92.7 kg)  05/19/20 200 lb (90.7 kg)  05/01/20 200 lb 12.8 oz (91.1 kg)    Glycemic control:   Lab Results  Component Value Date   HGBA1C 6.1 05/22/2020   HGBA1C 6.2 02/18/2020   HGBA1C 6.7 12/09/2019   Lab Results  Component Value Date   MICROALBUR 191.5 (H) 02/18/2020   LDLCALC 49 06/16/2019   CREATININE 1.82 (H) 05/22/2020     Past history:  He was not symptomatic at time of diagnosis and was probably treated with Amaryl only.  No details are available of previous management However he thinks he was not given metformin at any time because of his "kidney problem" He had a A1c of over 9% in 2013 and may have been started on insulin at that time. Most likely has  been taking Levemir and NovoLog since then A1c records show his results were over 8% in 2014 and in 4/15 but subsequently down to 7.1 in November 2015 Amaryl was stopped in 7/16  Other active problems are addressed in Review of systems   Allergies as of 05/24/2020   No Known Allergies     Medication List       Accurate as of May 24, 2020  2:06 PM. If you have any questions, ask your nurse or doctor.        allopurinol 100 MG tablet Commonly known as: ZYLOPRIM Take 200 mg by mouth daily after supper.   amLODipine 10 MG tablet Commonly known as: NORVASC Take 1 tablet (10 mg total) by mouth daily.   aspirin EC 81 MG tablet Take 81 mg by mouth daily.   atorvastatin 40 MG tablet Commonly known as: LIPITOR Take 1 tablet (40 mg total) by mouth daily at 6 PM.   CENTRUM SILVER PO Take by mouth.   CINNAMON PO Take by mouth.   cloNIDine 0.1 MG tablet Commonly known as: CATAPRES Take 1 tablet (0.1 mg total) by mouth 2 (two) times daily.   folic acid 1 MG tablet Commonly known as: FOLVITE Take 1 tablet (1 mg total) by mouth daily.   FreeStyle Freedom Lite w/Device Kit Use to check blood sugar three times daily. DX:E11.65   FREESTYLE LITE test strip Generic drug: glucose blood USE AS INSTRUCTED TO TEST BLOOD SUGARS 3 TIMES DAILY. DX CODE-E11.65 (PAY FOR 100 IN 90 DAYS)   furosemide 20 MG tablet Commonly known as: Lasix Take 1 tablet (20 mg total) by mouth as needed for edema.   hydrALAZINE 50 MG tablet Commonly known as: APRESOLINE Take 1 tablet (50 mg total) by mouth 3 (three) times daily.   inFLIXimab 100 MG injection Commonly known as: REMICADE Inject into the vein.   insulin lispro 100 UNIT/ML injection Commonly known as: HumaLOG Use max of 140 units daily via insulin pump.   metoprolol tartrate 50 MG tablet Commonly known as: LOPRESSOR Take 1 tablet (50 mg total) by mouth 2 (two) times daily.   nitroGLYCERIN 0.4 MG SL tablet Commonly known as:  NITROSTAT Place 1 tablet (0.4 mg total) under the tongue every 5 (five) minutes  as needed for chest pain.   OSTEO BI-FLEX ONE PER DAY PO Take by mouth.   OSTEO BI-FLEX REGULAR STRENGTH PO Take 1 tablet by mouth daily after supper.   sertraline 25 MG tablet Commonly known as: ZOLOFT   TART CHERRY ADVANCED PO Take by mouth.   VITAMIN D3 PO Take by mouth.       Allergies: No Known Allergies  Past Medical History:  Diagnosis Date  . Anxiety   . Arthritis   . Bladder cancer ([place]) 2009  . CKD (chronic kidney disease), stage III   . Coronary artery disease   . Degenerative arthritis of shoulder region 08/2013   left  . Disorder of bursae and tendons in left shoulder region 08/2013  . Gout    "on daily RX" (07/15/2018)  . History of kidney stones   . Hyperlipidemia   . Hypertension    under control with meds., has been on med. > 10 yr.  . IDDM (insulin dependent diabetes mellitus)   . Prostate cancer [facility])     Past Surgical History:  Procedure Laterality Date  . CARDIAC CATHETERIZATION  07/15/2018  . CHOLECYSTECTOMY  08/29/2012   Procedure: LAPAROSCOPIC CHOLECYSTECTOMY WITH INTRAOPERATIVE CHOLANGIOGRAM;  Surgeon: Imogene Burn. Tsuei, MD;  Location: [facility];  Service: General;  Laterality: N/A;  . CORONARY ARTERY BYPASS GRAFT N/A 07/20/2018   Procedure: CORONARY ARTERY BYPASS GRAFTING (CABG) times three using left internal mammary artery to the LAD, and using endoscopically harvested right saphenous vein to PDA and intermedius.;  Surgeon: Grace Isaac, MD;  Location: [place];  Service: Open Heart Surgery;  Laterality: N/A;  . LEFT HEART CATH AND CORONARY ANGIOGRAPHY N/A 07/15/2018   Procedure: LEFT HEART CATH AND CORONARY ANGIOGRAPHY;  Surgeon: Leonie Man, MD;  Location: [name];  Service: Cardiovascular;  Laterality: N/A;  . SHOULDER ARTHROSCOPY W/ ROTATOR CUFF REPAIR Right 2018  . SHOULDER ARTHROSCOPY WITH SUBACROMIAL DECOMPRESSION, ROTATOR CUFF REPAIR  AND BICEP TENDON REPAIR Left 09/03/2013   Procedure: LEFT SHOULDER ARTHROSCOPY WITH EXTENSIVE DEBRIDMENT, DISTAL CLAVICULECTOMY, ROTATOR CUFF REPAIR AND SUBACROMIAL DECOMPRESSION PARTIAL ACRIOMIOPLASTY WITH CORACOACROMIAL RELEASE;  Surgeon: Renette Butters, MD;  Location: [place];  Service: Orthopedics;  Laterality: Left;  . TEE WITHOUT CARDIOVERSION N/A 07/20/2018   Procedure: TRANSESOPHAGEAL ECHOCARDIOGRAM (TEE);  Surgeon: Grace Isaac, MD;  Location: [place];  Service: Open Heart Surgery;  Laterality: N/A;  . TRANSURETHRAL RESECTION OF BLADDER TUMOR WITH MITOMYCIN-C  08/24/2008    Family History  Problem Relation Age of Onset  . Diabetes Mother   . Lung cancer Mother   . Cancer Father   . Coronary artery disease Father        MI at age 42.   . Lung cancer Father   . Sudden death Son   . CAD Maternal Grandfather        MI in his 60's  . CAD Paternal Grandfather   . Breast cancer Neg Hx   . Colon cancer Neg Hx   . Pancreatic cancer Neg Hx     Social History:  reports that he quit smoking about 22 years ago. His smoking use included cigarettes. He has a 50.00 pack-year smoking history. He has never used smokeless tobacco. He reports previous alcohol use. He reports that he does not use drugs.    Review of Systems      HYPERTENSION: Blood pressure also followed by cardiologist and nephrologist He also checks at home  He is not  on ACE or ARB medication  BP Readings from Last 3 Encounters:  05/24/20 (!) 150/60  05/01/20 (!) 158/60  04/18/20 (!) 151/63           Lipids: he is taking Lipitor 40 mg from his PCP LDL in 11/2019 was 41      Lab Results  Component Value Date   CHOL 104 06/16/2019   HDL 70 06/16/2019   LDLCALC 49 06/16/2019   TRIG 106 06/16/2019   CHOLHDL 3.8 07/16/2018               CKD: He has been followed by nephrologist for diabetic nephropathy.  Recently no changes made Last urine microalbumin ratio is much higher, previously  had come back to normal with lisinopril and not clear why he is not taking this  His creatinine is variable but overall stable   Lab Results  Component Value Date   CREATININE 1.82 (H) 05/22/2020   CREATININE 1.63 (H) 02/18/2020   CREATININE 1.58 (H) 11/17/2019   CREATININE 1.51 (H) 11/17/2019   Lab Results  Component Value Date   K 4.1 05/22/2020    Last foot exam in 08/2019  Has neuropathic symptoms controlled with low-dose gabapentin, also on Requip from PCP   Eye exam last:  10/20   Physical Examination:  BP (!) 150/60 (BP Location: Left Arm, Patient Position: Sitting, Cuff Size: Normal)   Pulse (!) 56   Ht $R'5\' 6"'hp$  (1.676 m)   Wt 204 lb 6.4 oz (92.7 kg)   SpO2 95%   BMI 32.99 kg/m       ASSESSMENT:  Diabetes type 2, insulin requiring  See history of present illness for detailed discussion of his current management, blood sugar patterns and problems identified  His A1c is 6.1  Recently his blood sugars are overall better controlled with more time in target As discussed above he does have some hyperglycemia at various times especially midday and sometimes late at night Postprandial readings are somewhat variable but no consistent pattern indicated and current amounts of boluses are usually adequate He is not actually doing carbohydrate counting which may help  Renal dysfunction: This has been mild and stable, however he has significant proteinuria and not clear why he is not on an ACE/ARB drug, followed by nephrologist recently Also has proteinuria  Blood pressure is slightly higher now and he will follow up with nephrologist  EDEMA: Since the edema is relatively mild he can take Lasix every 2 or 3 days or as needed without any effect on renal function  PLAN: Basal rates to be continued as before Discussed day-to-day management of his pump, avoiding high and low blood sugars He also needs to try and bolus extra for eating out, higher fat meals and trying to  estimate carbohydrates better for more consistent postprandial control For now he agrees to see the dietitian for nutritional management since he has not had any follow-up education  This will also help him count carbohydrates better as well as have better planning of his meals for both diabetes and hypertension  Follow-up in 3 months   There are no Patient Instructions on file for this visit.       Elayne Snare 05/24/2020, 2:06 PM   Note: This office note was prepared with Dragon voice recognition system technology. Any transcriptional errors that result from this process are unintentional.

## 2020-05-25 ENCOUNTER — Ambulatory Visit
Admission: RE | Admit: 2020-05-25 | Discharge: 2020-05-25 | Disposition: A | Discharge status: Home / Self Care | Payer: Medicare Other | Source: Ambulatory Visit | Attending: Urology | Admitting: Urology

## 2020-05-25 ENCOUNTER — Ambulatory Visit (HOSPITAL_COMMUNITY)
Admission: RE | Admit: 2020-05-25 | Discharge: 2020-05-25 | Disposition: A | Discharge status: Home / Self Care | Payer: Medicare Other | Source: Ambulatory Visit | Attending: Urology | Admitting: Urology

## 2020-05-25 ENCOUNTER — Other Ambulatory Visit: Payer: Self-pay

## 2020-05-25 ENCOUNTER — Ambulatory Visit: Payer: Medicare Other | Admitting: Endocrinology

## 2020-05-25 ENCOUNTER — Encounter: Payer: Self-pay | Admitting: Medical Oncology

## 2020-05-25 ENCOUNTER — Ambulatory Visit (HOSPITAL_COMMUNITY): Payer: Medicare Other

## 2020-05-25 ENCOUNTER — Encounter (HOSPITAL_COMMUNITY)
Admission: RE | Admit: 2020-05-25 | Discharge: 2020-05-25 | Disposition: A | Discharge status: Home / Self Care | Payer: Medicare Other | Source: Ambulatory Visit | Attending: Urology | Admitting: Urology

## 2020-05-25 ENCOUNTER — Ambulatory Visit
Admission: RE | Admit: 2020-05-25 | Discharge: 2020-05-25 | Disposition: A | Discharge status: Home / Self Care | Payer: Medicare Other | Source: Ambulatory Visit | Attending: Radiation Oncology | Admitting: Radiation Oncology

## 2020-05-25 DIAGNOSIS — Z510 Encounter for antineoplastic radiation therapy: Secondary | ICD-10-CM | POA: Diagnosis not present

## 2020-05-25 DIAGNOSIS — C61 Malignant neoplasm of prostate: Secondary | ICD-10-CM | POA: Insufficient documentation

## 2020-05-25 NOTE — Addendum Note (Signed)
Encounter addended by: Tyler Pita, MD on: 05/25/2020 10:47 AM  Actions taken: Medication List reviewed, Problem List reviewed, Allergies reviewed, Clinical Note Signed

## 2020-05-25 NOTE — Progress Notes (Signed)
  Radiation Oncology         [phone] ________________________________  Name: Alec [name] MRN: [id number]  Date: 05/25/2020  DOB: [birth year]  SIMULATION AND TREATMENT PLANNING NOTE PUBIC ARCH STUDY  VO:JJKK, Alec May, MD  [name] Hughs, MD  DIAGNOSIS:  Oncology History  Malignant neoplasm of prostate ([place])  03/14/2020 Cancer Staging   Staging form: Prostate, AJCC 8th Edition - Clinical stage from 03/14/2020: Stage IIB (cT1c, cN0, cM0, PSA: 7.3, Grade Group: 2) - Signed by Freeman Caldron, PA-C on 04/18/2020   04/18/2020 Initial Diagnosis   Malignant neoplasm of prostate ([place])       ICD-10-CM   1. Malignant neoplasm of prostate ([place])  C61     COMPLEX SIMULATION:  The patient presented today for evaluation for possible prostate seed implant. He was brought to the radiation planning suite and placed supine on the CT couch. A 3-dimensional image study set was obtained in upload to the planning computer. There, on each axial slice, I contoured the prostate gland. Then, using three-dimensional radiation planning tools I reconstructed the prostate in view of the structures from the transperineal needle pathway to assess for possible pubic arch interference. In doing so, I did not appreciate any pubic arch interference. Also, the patient's prostate volume was estimated based on the drawn structure. The volume was 41 cc.  Given the pubic arch appearance and prostate volume, patient remains a good candidate to proceed with prostate seed implant. Today, he freely provided informed written consent to proceed.    PLAN: The patient will undergo prostate seed implant.   ________________________________  Sheral Apley. Tammi Klippel, M.D.

## 2020-05-31 DIAGNOSIS — G4733 Obstructive sleep apnea (adult) (pediatric): Secondary | ICD-10-CM | POA: Diagnosis not present

## 2020-06-03 ENCOUNTER — Other Ambulatory Visit: Payer: Self-pay | Admitting: Cardiology

## 2020-06-08 ENCOUNTER — Telehealth: Payer: Self-pay | Admitting: *Deleted

## 2020-06-08 NOTE — Telephone Encounter (Signed)
Called patient to inform that I have spoke with [facility] and informed him that the staff will take good care of him, regarding his diabetes, patient verified understanding this

## 2020-06-13 ENCOUNTER — Other Ambulatory Visit (HOSPITAL_BASED_OUTPATIENT_CLINIC_OR_DEPARTMENT_OTHER): Payer: Self-pay

## 2020-06-13 ENCOUNTER — Other Ambulatory Visit: Payer: Self-pay | Admitting: Adult Health

## 2020-06-13 DIAGNOSIS — Z23 Encounter for immunization: Secondary | ICD-10-CM | POA: Diagnosis not present

## 2020-06-13 DIAGNOSIS — N2581 Secondary hyperparathyroidism of renal origin: Secondary | ICD-10-CM | POA: Diagnosis not present

## 2020-06-13 DIAGNOSIS — C61 Malignant neoplasm of prostate: Secondary | ICD-10-CM | POA: Diagnosis not present

## 2020-06-13 DIAGNOSIS — M069 Rheumatoid arthritis, unspecified: Secondary | ICD-10-CM | POA: Diagnosis not present

## 2020-06-13 DIAGNOSIS — F411 Generalized anxiety disorder: Secondary | ICD-10-CM | POA: Diagnosis not present

## 2020-06-13 DIAGNOSIS — I2510 Atherosclerotic heart disease of native coronary artery without angina pectoris: Secondary | ICD-10-CM | POA: Diagnosis not present

## 2020-06-13 DIAGNOSIS — E782 Mixed hyperlipidemia: Secondary | ICD-10-CM | POA: Diagnosis not present

## 2020-06-13 DIAGNOSIS — G4710 Hypersomnia, unspecified: Secondary | ICD-10-CM

## 2020-06-13 DIAGNOSIS — R0683 Snoring: Secondary | ICD-10-CM

## 2020-06-13 DIAGNOSIS — Z0000 Encounter for general adult medical examination without abnormal findings: Secondary | ICD-10-CM | POA: Diagnosis not present

## 2020-06-13 DIAGNOSIS — E010 Iodine-deficiency related diffuse (endemic) goiter: Secondary | ICD-10-CM | POA: Diagnosis not present

## 2020-06-13 DIAGNOSIS — E1122 Type 2 diabetes mellitus with diabetic chronic kidney disease: Secondary | ICD-10-CM | POA: Diagnosis not present

## 2020-06-13 DIAGNOSIS — I129 Hypertensive chronic kidney disease with stage 1 through stage 4 chronic kidney disease, or unspecified chronic kidney disease: Secondary | ICD-10-CM | POA: Diagnosis not present

## 2020-06-16 DIAGNOSIS — E079 Disorder of thyroid, unspecified: Secondary | ICD-10-CM | POA: Diagnosis not present

## 2020-06-16 DIAGNOSIS — E010 Iodine-deficiency related diffuse (endemic) goiter: Secondary | ICD-10-CM | POA: Diagnosis not present

## 2020-06-19 ENCOUNTER — Other Ambulatory Visit: Payer: Self-pay

## 2020-06-19 ENCOUNTER — Encounter (HOSPITAL_BASED_OUTPATIENT_CLINIC_OR_DEPARTMENT_OTHER): Payer: Self-pay | Admitting: Urology

## 2020-06-19 ENCOUNTER — Encounter (HOSPITAL_COMMUNITY)
Admission: RE | Admit: 2020-06-19 | Discharge: 2020-06-19 | Disposition: A | Discharge status: Home / Self Care | Payer: Medicare Other | Source: Ambulatory Visit | Attending: Urology | Admitting: Urology

## 2020-06-19 ENCOUNTER — Other Ambulatory Visit (HOSPITAL_COMMUNITY)
Admission: RE | Admit: 2020-06-19 | Discharge: 2020-06-19 | Disposition: A | Discharge status: Home / Self Care | Payer: Medicare Other | Source: Ambulatory Visit | Attending: Urology | Admitting: Urology

## 2020-06-19 DIAGNOSIS — Z20822 Contact with and (suspected) exposure to covid-19: Secondary | ICD-10-CM | POA: Diagnosis not present

## 2020-06-19 DIAGNOSIS — Z01812 Encounter for preprocedural laboratory examination: Secondary | ICD-10-CM | POA: Diagnosis not present

## 2020-06-19 LAB — CBC
HCT: 39.6 % (ref 39.0–52.0)
Hemoglobin: 13.6 g/dL (ref 13.0–17.0)
MCH: 31.3 pg (ref 26.0–34.0)
MCHC: 34.3 g/dL (ref 30.0–36.0)
MCV: 91.2 fL (ref 80.0–100.0)
Platelets: 148 10*3/uL — ABNORMAL LOW (ref 150–400)
RBC: 4.34 MIL/uL (ref 4.22–5.81)
RDW: 13.5 % (ref 11.5–15.5)
WBC: 4.5 10*3/uL (ref 4.0–10.5)
nRBC: 0 % (ref 0.0–0.2)

## 2020-06-19 LAB — COMPREHENSIVE METABOLIC PANEL
ALT: 30 U/L (ref 0–44)
AST: 29 U/L (ref 15–41)
Albumin: 3.8 g/dL (ref 3.5–5.0)
Alkaline Phosphatase: 66 U/L (ref 38–126)
Anion gap: 9 (ref 5–15)
BUN: 26 mg/dL — ABNORMAL HIGH (ref 8–23)
CO2: 23 mmol/L (ref 22–32)
Calcium: 9.2 mg/dL (ref 8.9–10.3)
Chloride: 107 mmol/L (ref 98–111)
Creatinine, Ser: 1.92 mg/dL — ABNORMAL HIGH (ref 0.61–1.24)
GFR calc Af Amer: 40 mL/min — ABNORMAL LOW (ref >60)
GFR calc non Af Amer: 34 mL/min — ABNORMAL LOW (ref >60)
Glucose, Bld: 170 mg/dL — ABNORMAL HIGH (ref 70–99)
Potassium: 4.1 mmol/L (ref 3.5–5.1)
Sodium: 139 mmol/L (ref 135–145)
Total Bilirubin: 1.1 mg/dL (ref 0.3–1.2)
Total Protein: 6.9 g/dL (ref 6.5–8.1)

## 2020-06-19 LAB — PROTIME-INR
INR: 1.1 (ref 0.8–1.2)
Prothrombin Time: 13.4 seconds (ref 11.4–15.2)

## 2020-06-19 LAB — SARS CORONAVIRUS 2 (TAT 6-24 HRS): SARS Coronavirus 2: NEGATIVE

## 2020-06-19 LAB — APTT: aPTT: 33 seconds (ref 24–36)

## 2020-06-19 NOTE — Progress Notes (Addendum)
Spoke w/ via phone for pre-op interview---  PT Lab needs dos---- no              Lab results------ pt had CBC, CMP, PT/PTT done today 06-19-2020, results in epic/  Current ekg/ cxr in epic/ chart COVID test ------ 06-19-2020 @ 1100 Arrive at -------  1100 NPO after MN NO Solid Food.  Clear liquids from MN until--- 1000 Medications to take morning of surgery ----- Zoloft, Clonidine, Hydralazine, Lopressor, Norvasc Diabetic medication ----- pt is to leave insulin pump on Patient Special Instructions ----- advised pt to call Dr Dwyane Dee for advise/ recommendation concerning insulin pump for surgery .  Pt to do one fleet enema morning of surgery Pre-Op special Istructions ----- n/a Patient verbalized understanding of instructions that were given at this phone interview. Patient denies shortness of breath, chest pain, fever, cough at this phone interview.   Anesthesia Review: HTN,  CAD s/p CABG in 10/ 2019,  CKD3,  IDDM2 w/ insulin pump,  RA (remicade infusion),  OSA no cpap (pt stated is having retest done 07-18-2020).  Pt denies any chest pain,  Sob with exertion, and peripheral swelling.  Chart to be reviewed by anesthesia, Konrad Felix PA.  PCP: Dr Raliegh Ip. Brigitte Pulse  (per pt last visit last week)  Cardiologist :  Dr Percival [facility] 05-01-2020 epic) Endocrinologist:  Dr Eduard Clos [facility] 05-24-2020 epic) Rheumatology:  Dr Saralyn Pilar (lov 03-09-2020 epic)  Chest x-ray : 05-25-2020 epic EKG : 10-28-2019 epic Echo : TEE 07-20-2018 epic Stress test: ETT 07-07-2018/ Nuclear 04-25-2017 epic Cardiac Cath :  07-15-2018 epic Activity level:   Going up flight stairs and doing yard work gets sob Sleep Study/ CPAP :  YES/  NO Fasting Blood Sugar :    98--110  / Checks Blood Sugar -- times a day:  Pt has dexcom Blood Thinner/ Instructions Maryjane Hurter Dose:  NO ASA / Instructions/ Last Dose :  NO/  Per pt given instructions from Dr Louis Meckel office to stop week prior to surgery,  Last dose 06-16-2020

## 2020-06-20 ENCOUNTER — Telehealth: Payer: Self-pay

## 2020-06-20 ENCOUNTER — Telehealth: Payer: Self-pay | Admitting: *Deleted

## 2020-06-20 DIAGNOSIS — Z79899 Other long term (current) drug therapy: Secondary | ICD-10-CM | POA: Diagnosis not present

## 2020-06-20 DIAGNOSIS — D72819 Decreased white blood cell count, unspecified: Secondary | ICD-10-CM | POA: Diagnosis not present

## 2020-06-20 DIAGNOSIS — M0609 Rheumatoid arthritis without rheumatoid factor, multiple sites: Secondary | ICD-10-CM | POA: Diagnosis not present

## 2020-06-20 NOTE — Telephone Encounter (Signed)
He can keep the pump on but reduce the basal rate using the temporary basal feature by 20%

## 2020-06-20 NOTE — Telephone Encounter (Signed)
CALLED PATIENT TO REMIND OF PROCEDURE FOR 06-21-20, SPOKE WITH PATIENT AND HE IS AWARE OF THIS  PROCEDURE

## 2020-06-20 NOTE — Telephone Encounter (Signed)
Advised patient of pump directions.

## 2020-06-20 NOTE — Telephone Encounter (Signed)
New message    The patient upcoming surgery tomorrow at [facility] long. Should he keep the insulin pump on during surgery or take it off.

## 2020-06-21 ENCOUNTER — Ambulatory Visit (HOSPITAL_BASED_OUTPATIENT_CLINIC_OR_DEPARTMENT_OTHER): Payer: Medicare Other | Admitting: Physician Assistant

## 2020-06-21 ENCOUNTER — Encounter (HOSPITAL_BASED_OUTPATIENT_CLINIC_OR_DEPARTMENT_OTHER)
Admission: RE | Disposition: A | Discharge status: Home / Self Care | Payer: Self-pay | Source: Home / Self Care | Attending: Urology

## 2020-06-21 ENCOUNTER — Ambulatory Visit (HOSPITAL_COMMUNITY): Payer: Medicare Other

## 2020-06-21 ENCOUNTER — Encounter (HOSPITAL_BASED_OUTPATIENT_CLINIC_OR_DEPARTMENT_OTHER): Payer: Self-pay | Admitting: Urology

## 2020-06-21 ENCOUNTER — Ambulatory Visit (HOSPITAL_BASED_OUTPATIENT_CLINIC_OR_DEPARTMENT_OTHER): Payer: Medicare Other | Admitting: Anesthesiology

## 2020-06-21 ENCOUNTER — Ambulatory Visit (HOSPITAL_BASED_OUTPATIENT_CLINIC_OR_DEPARTMENT_OTHER)
Admission: RE | Admit: 2020-06-21 | Discharge: 2020-06-21 | Disposition: A | Discharge status: Home / Self Care | Payer: Medicare Other | Attending: Urology | Admitting: Urology

## 2020-06-21 ENCOUNTER — Other Ambulatory Visit: Payer: Self-pay

## 2020-06-21 DIAGNOSIS — Z951 Presence of aortocoronary bypass graft: Secondary | ICD-10-CM | POA: Insufficient documentation

## 2020-06-21 DIAGNOSIS — E7800 Pure hypercholesterolemia, unspecified: Secondary | ICD-10-CM | POA: Diagnosis not present

## 2020-06-21 DIAGNOSIS — Z79899 Other long term (current) drug therapy: Secondary | ICD-10-CM | POA: Diagnosis not present

## 2020-06-21 DIAGNOSIS — M1990 Unspecified osteoarthritis, unspecified site: Secondary | ICD-10-CM | POA: Insufficient documentation

## 2020-06-21 DIAGNOSIS — I10 Essential (primary) hypertension: Secondary | ICD-10-CM | POA: Diagnosis not present

## 2020-06-21 DIAGNOSIS — C61 Malignant neoplasm of prostate: Secondary | ICD-10-CM | POA: Insufficient documentation

## 2020-06-21 DIAGNOSIS — I252 Old myocardial infarction: Secondary | ICD-10-CM | POA: Insufficient documentation

## 2020-06-21 DIAGNOSIS — E119 Type 2 diabetes mellitus without complications: Secondary | ICD-10-CM | POA: Diagnosis not present

## 2020-06-21 DIAGNOSIS — Z7982 Long term (current) use of aspirin: Secondary | ICD-10-CM | POA: Diagnosis not present

## 2020-06-21 DIAGNOSIS — I2510 Atherosclerotic heart disease of native coronary artery without angina pectoris: Secondary | ICD-10-CM | POA: Diagnosis not present

## 2020-06-21 DIAGNOSIS — Z794 Long term (current) use of insulin: Secondary | ICD-10-CM | POA: Insufficient documentation

## 2020-06-21 DIAGNOSIS — Z87891 Personal history of nicotine dependence: Secondary | ICD-10-CM | POA: Insufficient documentation

## 2020-06-21 DIAGNOSIS — Z833 Family history of diabetes mellitus: Secondary | ICD-10-CM | POA: Diagnosis not present

## 2020-06-21 DIAGNOSIS — E785 Hyperlipidemia, unspecified: Secondary | ICD-10-CM | POA: Diagnosis not present

## 2020-06-21 HISTORY — DX: Chronic gout, unspecified, without tophus (tophi): M1A.9XX0

## 2020-06-21 HISTORY — DX: Other forms of dyspnea: R06.09

## 2020-06-21 HISTORY — DX: Dyspnea, unspecified: R06.00

## 2020-06-21 HISTORY — DX: Presence of insulin pump (external) (internal): Z96.41

## 2020-06-21 HISTORY — PX: RADIOACTIVE SEED IMPLANT: SHX5150

## 2020-06-21 HISTORY — DX: Rheumatoid arthritis, unspecified: M06.9

## 2020-06-21 HISTORY — DX: Obstructive sleep apnea (adult) (pediatric): G47.33

## 2020-06-21 HISTORY — PX: SPACE OAR INSTILLATION: SHX6769

## 2020-06-21 HISTORY — DX: Localized edema: R60.0

## 2020-06-21 LAB — GLUCOSE, CAPILLARY
Glucose-Capillary: 115 mg/dL — ABNORMAL HIGH (ref 70–99)
Glucose-Capillary: 107 mg/dL — ABNORMAL HIGH (ref 70–99)

## 2020-06-21 SURGERY — INSERTION, RADIATION SOURCE, PROSTATE
Anesthesia: General

## 2020-06-21 MED ORDER — PHENAZOPYRIDINE HCL 200 MG PO TABS: 200.0000 mg | ORAL_TABLET | Freq: Three times a day (TID) | ORAL | 0 refills | Status: DC | PRN

## 2020-06-21 MED ORDER — SODIUM CHLORIDE 0.9 % IV SOLN
INTRAVENOUS | Status: AC | PRN
  Administered 2020-06-21: 1000 mL

## 2020-06-21 MED ORDER — FENTANYL CITRATE (PF) 100 MCG/2ML IJ SOLN
INTRAMUSCULAR | Status: DC | PRN
Start: 2020-06-21 — End: 2020-06-21
  Administered 2020-06-21 (×2): 50 ug via INTRAVENOUS
  Administered 2020-06-21: 25 ug via INTRAVENOUS

## 2020-06-21 MED ORDER — IOHEXOL 300 MG/ML  SOLN
INTRAMUSCULAR | Status: DC | PRN
  Administered 2020-06-21: 7 mL

## 2020-06-21 MED ORDER — GLYCOPYRROLATE PF 0.2 MG/ML IJ SOSY
PREFILLED_SYRINGE | INTRAMUSCULAR | Status: AC
  Filled 2020-06-21: qty 1

## 2020-06-21 MED ORDER — ACETAMINOPHEN 160 MG/5ML PO SOLN: 325.0000 mg | ORAL | Status: DC | PRN

## 2020-06-21 MED ORDER — PROPOFOL 10 MG/ML IV BOLUS
INTRAVENOUS | Status: DC | PRN
  Administered 2020-06-21: 120 mg via INTRAVENOUS

## 2020-06-21 MED ORDER — CIPROFLOXACIN IN D5W 400 MG/200ML IV SOLN
INTRAVENOUS | Status: AC
  Filled 2020-06-21: qty 200

## 2020-06-21 MED ORDER — SODIUM CHLORIDE 0.9 % IV SOLN
INTRAVENOUS | Status: DC
  Administered 2020-06-21: 1000 mL via INTRAVENOUS

## 2020-06-21 MED ORDER — FLEET ENEMA 7-19 GM/118ML RE ENEM: 1.0000 | ENEMA | Freq: Once | RECTAL | Status: DC

## 2020-06-21 MED ORDER — TAMSULOSIN HCL 0.4 MG PO CAPS: 0.4000 mg | ORAL_CAPSULE | Freq: Every day | ORAL | 5 refills | Status: DC

## 2020-06-21 MED ORDER — SODIUM CHLORIDE (PF) 0.9 % IJ SOLN
INTRAMUSCULAR | Status: DC | PRN
  Administered 2020-06-21: 10 mL

## 2020-06-21 MED ORDER — TRAMADOL HCL 50 MG PO TABS: 50.0000 mg | ORAL_TABLET | Freq: Four times a day (QID) | ORAL | 0 refills | Status: DC | PRN

## 2020-06-21 MED ORDER — EPHEDRINE SULFATE 50 MG/ML IJ SOLN
INTRAMUSCULAR | Status: DC | PRN
  Administered 2020-06-21 (×5): 10 mg via INTRAVENOUS

## 2020-06-21 MED ORDER — LIDOCAINE HCL (CARDIAC) PF 100 MG/5ML IV SOSY
PREFILLED_SYRINGE | INTRAVENOUS | Status: DC | PRN
  Administered 2020-06-21: 100 mg via INTRAVENOUS

## 2020-06-21 MED ORDER — CIPROFLOXACIN IN D5W 400 MG/200ML IV SOLN
400.0000 mg | INTRAVENOUS | Status: AC
  Administered 2020-06-21: 400 mg via INTRAVENOUS

## 2020-06-21 MED ORDER — FENTANYL CITRATE (PF) 250 MCG/5ML IJ SOLN
INTRAMUSCULAR | Status: AC
  Filled 2020-06-21: qty 5

## 2020-06-21 MED ORDER — ONDANSETRON HCL 4 MG/2ML IJ SOLN: 4.0000 mg | Freq: Once | INTRAMUSCULAR | Status: DC | PRN

## 2020-06-21 MED ORDER — ACETAMINOPHEN 325 MG PO TABS: 325.0000 mg | ORAL_TABLET | ORAL | Status: DC | PRN

## 2020-06-21 MED ORDER — ONDANSETRON HCL 4 MG/2ML IJ SOLN
INTRAMUSCULAR | Status: DC | PRN
  Administered 2020-06-21: 4 mg via INTRAVENOUS

## 2020-06-21 MED ORDER — OXYCODONE HCL 5 MG/5ML PO SOLN: 5.0000 mg | Freq: Once | ORAL | Status: DC | PRN

## 2020-06-21 MED ORDER — MEPERIDINE HCL 25 MG/ML IJ SOLN: 6.2500 mg | INTRAMUSCULAR | Status: DC | PRN

## 2020-06-21 MED ORDER — FENTANYL CITRATE (PF) 100 MCG/2ML IJ SOLN: 25.0000 ug | INTRAMUSCULAR | Status: DC | PRN

## 2020-06-21 MED ORDER — OXYCODONE HCL 5 MG PO TABS: 5.0000 mg | ORAL_TABLET | Freq: Once | ORAL | Status: DC | PRN

## 2020-06-21 SURGICAL SUPPLY — 37 items
AGX100 ×1 IMPLANT
BAG DRN RND TRDRP ANRFLXCHMBR (UROLOGICAL SUPPLIES) ×1
BAG URINE DRAIN 2000ML AR STRL (UROLOGICAL SUPPLIES) ×2 IMPLANT
BLADE CLIPPER SENSICLIP SURGIC (BLADE) ×2 IMPLANT
CATH FOLEY 2WAY SLVR  5CC 16FR (CATHETERS) ×2
CATH FOLEY 2WAY SLVR 5CC 16FR (CATHETERS) ×1 IMPLANT
CATH ROBINSON RED A/P 16FR (CATHETERS) IMPLANT
CATH ROBINSON RED A/P 20FR (CATHETERS) ×2 IMPLANT
CLOTH BEACON ORANGE TIMEOUT ST (SAFETY) ×2 IMPLANT
CNTNR URN SCR LID CUP LEK RST (MISCELLANEOUS) ×2 IMPLANT
CONT SPEC 4OZ STRL OR WHT (MISCELLANEOUS) ×4
COVER BACK TABLE 60X90IN (DRAPES) ×2 IMPLANT
COVER MAYO STAND STRL (DRAPES) ×2 IMPLANT
DRAPE C-ARM 35X43 STRL (DRAPES) ×2 IMPLANT
DRSG TEGADERM 4X4.75 (GAUZE/BANDAGES/DRESSINGS) ×2 IMPLANT
DRSG TEGADERM 8X12 (GAUZE/BANDAGES/DRESSINGS) ×2 IMPLANT
GAUZE SPONGE 4X4 12PLY STRL (GAUZE/BANDAGES/DRESSINGS) ×1 IMPLANT
GLOVE BIO SURGEON STRL SZ 6.5 (GLOVE) ×2 IMPLANT
GLOVE BIO SURGEON STRL SZ7.5 (GLOVE) ×4 IMPLANT
GLOVE BIO SURGEON STRL SZ8 (GLOVE) IMPLANT
GLOVE SURG ORTHO 8.5 STRL (GLOVE) IMPLANT
GLOVE SURG SS PI 6.5 STRL IVOR (GLOVE) IMPLANT
GOWN STRL REUS W/TWL XL LVL3 (GOWN DISPOSABLE) ×2 IMPLANT
HOLDER FOLEY CATH W/STRAP (MISCELLANEOUS) ×2 IMPLANT
IMPL SPACEOAR VUE SYSTEM (Spacer) IMPLANT
IMPLANT SPACEOAR VUE SYSTEM (Spacer) ×2 IMPLANT
IV NS 1000ML (IV SOLUTION) ×4
IV NS 1000ML BAXH (IV SOLUTION) ×2 IMPLANT
KIT TURNOVER CYSTO (KITS) ×2 IMPLANT
MARKER SKIN DUAL TIP RULER LAB (MISCELLANEOUS) ×2 IMPLANT
PACK CYSTO (CUSTOM PROCEDURE TRAY) ×2 IMPLANT
SUT BONE WAX W31G (SUTURE) IMPLANT
SYR 10ML LL (SYRINGE) ×2 IMPLANT
TOWEL OR 17X26 10 PK STRL BLUE (TOWEL DISPOSABLE) ×2 IMPLANT
UNDERPAD 30X36 HEAVY ABSORB (UNDERPADS AND DIAPERS) ×4 IMPLANT
WATER STERILE IRR 3000ML UROMA (IV SOLUTION) IMPLANT
WATER STERILE IRR 500ML POUR (IV SOLUTION) ×2 IMPLANT

## 2020-06-21 NOTE — Anesthesia Procedure Notes (Signed)
Procedure Name: LMA Insertion Date/Time: 06/21/2020 1:15 PM Performed by: Georgeanne Nim, CRNA Pre-anesthesia Checklist: Patient identified, Emergency Drugs available, Suction available, Patient being monitored and Timeout performed Patient Re-evaluated:Patient Re-evaluated prior to induction Oxygen Delivery Method: Circle system utilized Preoxygenation: Pre-oxygenation with 100% oxygen Induction Type: IV induction Ventilation: Mask ventilation without difficulty LMA: LMA inserted LMA Size: 4.0 Number of attempts: 1 Placement Confirmation: positive ETCO2,  CO2 detector and breath sounds checked- equal and bilateral Tube secured with: Tape Dental Injury: Teeth and Oropharynx as per pre-operative assessment

## 2020-06-21 NOTE — Discharge Instructions (Signed)

## 2020-06-21 NOTE — H&P (Signed)
Pt presents today for pre-operative history and physical exam in anticipation of radioactive seed and space oar placement on 06/21/20 by Dr. Louis Meckel. He is doing well and is without complaint. His last eval with his cardiologist Dr. Percival Spanish was on May 02, 2020 and he was given a good report.   Pt denies F/C, HA, CP, N/V, diarrhea/constipation, flank pain, hematuria, and dysuria. He has mild SOB with working in the yard but can climb a flight of stairs with ease.     HX:     CC: f/u for Prostate Cancer  HPI: Alec [name] is a 74 year-old male established patient who is here for f/u while on Active Surveillance for Prostate Cancer .  Sildenafil 40-60 mg has been effective. The patient denies any progression of his lower urinary tract symptoms. Denies any hematuria or dysuria.   Interval: The patient presents today for follow-up. He underwent a prostate biopsy as part of his active surveillance protocol. Pre the patient was considered very low risk. However, his most recent biopsy demonstrates 2 cores of Gleason 3+4=7.   The patient reiterates he has very mild voiding symptoms.   The patient has a history of heart attack, and had a three-vessel CABG. He is diabetic.     The patient was last seen May 2020.   He was diagnosed with prostate cancer in 08/03/2016. At the time of his prostate cancer diagnosis his PSA was 5.5. The patient's Gleason score at the time of diagnosis was 3+3=6. There were 3 positive cores on his initial biospy. He has had 1 biopsy(ies). The patient's most recent biopsy was 06/03/2017.   The patient has not had a prostate MRI.   His most recent PSA is 7.21. This was drawn on approximately 02/08/2020. PSA History: 10/20: 7.95, 9/18: 4.19.   He does not have problems with erections. He does not have urinary incontinence. He does not have an abnormal sensation when he needs to urinate. He does not have to strain or bear down to start his urinary stream.   He is not having  pain in new locations. He does have a good appetite. His bowels are moving normally. He has not seen blood in his stool since the biopsy. He has not recently had unwanted weight loss.     AUA Symptom Score: He never has the sensation of not emptying his bladder completely after finishing urinating. He never has to urinate again less that two hours after he has finished urinating. He does not have to stop and start again several times when he urinates. He never finds it difficult to postpone urination. Less than 20% of the time he has a weak urinary stream. He never has to push or strain to begin urination. He has to get up to urinate 1 time from the time he goes to bed until the time he gets up in the morning.   Calculated AUA Symptom Score: 2    QOL Score: He would feel pleased if he had to live with his urinary condition the way it is now for the rest of his life.   Calculated QOL Symptom Score: 1    ALLERGIES: No Allergies    MEDICATIONS: Levofloxacin 750 mg tablet 1 tablet PO once Take on morning of the procedure  Sildenafil Citrate 20 mg tablet 2-5 tablets daily as needed  Amlodipine Besylate 10 mg tablet 0 Oral  Aspirin Ec 81 mg tablet, delayed release 0 Oral  Atorvastatin Calcium 20 mg tablet Oral  Folic  Acid  Lisinopril-Hydrochlorothiazide 20-12.5 MG Oral Tablet Oral  Methotrexate 2.5 mg tablet  Multivitamin  NovoLOG 100 UNIT/ML Subcutaneous Solution Subcutaneous  Osteo Bi-Flex  Requip  Sertraline HCl - 100 MG Oral Tablet 0 Oral  Toujeo Solostar 300 unit/ml (1.5 ml) insulin pen Subcutaneous  Victoza 2-Pak     Notes: Gets infusion every 2 months for rheumatoid arthritis    GU PSH: Cystoscopy - 01/27/2020, 2019, 2017 Cystoscopy TURBT 2-5 cm - 2009 Prostate Needle Biopsy - 03/14/2020, 2018, 2017       PSH Notes: Shoulder Surgery Left, Cystoscopy With Fulguration Medium Lesion (2-5cm), right shoulder surgery (08/2017), triple heart by pass 06/2018   lap chole approx 10  years ago   NON-GU PSH: Surgical Pathology, Gross And Microscopic Examination For Prostate Needle - 03/14/2020, 2018, 2017     GU PMH: Prostate Cancer - 03/21/2020, - 07/07/2018, - 2019, - 2017, - 2017 Bladder Cancer overlapping sites - 07/07/2018 Bladder Cancer Lateral - 2019 Elevated PSA - 2017 Gross hematuria, Gross hematuria - 2016 History of bladder cancer, History of bladder cancer - 2016 Bladder Cancer, Unspec, Bladder cancer - 2014 ED due to arterial insufficiency, Erectile dysfunction due to arterial insufficiency - 2014      PMH Notes:  2008-08-05 15:15:06 - Note: Arthritis   NON-GU PMH: Bacteriuria - 2018 Encounter for general adult medical examination without abnormal findings, Encounter for preventive health examination - 2016 Anxiety, Anxiety - 2014 Gout, Gout - 2014 Personal history of other diseases of the circulatory system, History of hypertension - 2014 Personal history of other endocrine, nutritional and metabolic disease, History of hypercholesterolemia - 2014, History of diabetes mellitus, - 2014    FAMILY HISTORY: Diabetes - Mother Family Health Status Number - [place] In Family Father Deceased At Age22 ___ - [place] In Family Kidney Stones - [place] In Family Lung Cancer - Father, Mother Mother Deceased At Age 6 from diabetic complicati - [place] In Family   SOCIAL HISTORY: Marital Status: Married Preferred Language: English; Ethnicity: Not Hispanic Or Latino; Race: White Current Smoking Status: Patient does not smoke anymore. Has not smoked since 06/30/1996.  Does not use smokeless tobacco. Has never drank.  Does not use drugs. Drinks 1 caffeinated drink per day. Has not had a blood transfusion.     Notes: Former Research scientist (life sciences), Father deceased, Number of children, Mother deceased, Occupation:, Alcohol Use, Caffeine Use, Marital History - Currently Married   REVIEW OF SYSTEMS:    GU Review Male:   Patient reports get up at night to urinate, stream starts and stops, and  erection problems. Patient denies frequent urination, hard to postpone urination, burning/ pain with urination, leakage of urine, trouble starting your stream, have to strain to urinate , and penile pain.  Gastrointestinal (Upper):   Patient denies nausea, vomiting, and indigestion/ heartburn.  Gastrointestinal (Lower):   Patient denies diarrhea and constipation.  Constitutional:   Patient denies fever, night sweats, weight loss, and fatigue.  Skin:   Patient denies skin rash/ lesion and itching.  Eyes:   Patient denies blurred vision and double vision.  Ears/ Nose/ Throat:   Patient denies sore throat and sinus problems.  Hematologic/Lymphatic:   Patient reports easy bruising. Patient denies swollen glands.  Cardiovascular:   Patient denies leg swelling and chest pains.  Respiratory:   Patient denies cough and shortness of breath.  Endocrine:   Patient denies excessive thirst.  Musculoskeletal:   Patient reports back pain and joint pain.   Neurological:   Patient denies  headaches and dizziness.  Psychologic:   Patient denies depression and anxiety.   VITAL SIGNS:      06/13/2020 01:01 PM  Weight 197 lb / 89.36 kg  Height 66 in / 167.64 cm  BP 130/67 mmHg  Pulse 52 /min  Temperature 97.5 F / 36.3 C  BMI 31.8 kg/m   MULTI-SYSTEM PHYSICAL EXAMINATION:    Constitutional: Well-nourished. No physical deformities. Normally developed. Good grooming.  Neck: Neck symmetrical, not swollen. Normal tracheal position.  Respiratory: Normal breath sounds. No labored breathing, no use of accessory muscles.   Cardiovascular: Regular rate and rhythm. No murmur, no gallop.   Lymphatic: No enlargement of neck, axillae, groin.  Skin: No paleness, no jaundice, no cyanosis. No lesion, no ulcer, no rash.  Neurologic / Psychiatric: Oriented to time, oriented to place, oriented to person. No depression, no anxiety, no agitation.  Gastrointestinal: No mass, no tenderness, no rigidity, obese abdomen.   Eyes:  Normal conjunctivae. Normal eyelids.  Ears, Nose, Mouth, and Throat: Left ear no scars, no lesions, no masses. Right ear no scars, no lesions, no masses. Nose no scars, no lesions, no masses. Normal hearing. Normal lips.  Musculoskeletal: Normal gait and station of head and neck.     Complexity of Data:  Records Review:   Previous Patient Records  Urine Test Review:   Urinalysis   06/13/20  Urinalysis  Urine Appearance Clear   Urine Color Amber   Urine Glucose Neg mg/dL  Urine Bilirubin Neg mg/dL  Urine Ketones Neg mg/dL  Urine Specific Gravity 1.025   Urine Blood Neg ery/uL  Urine pH 6.0   Urine Protein 3+ mg/dL  Urine Urobilinogen 0.2 mg/dL  Urine Nitrites Neg   Urine Leukocyte Esterase Neg leu/uL  Urine WBC/hpf 0 - 5/hpf   Urine RBC/hpf NS (Not Seen)   Urine Epithelial Cells NS (Not Seen)   Urine Bacteria NS (Not Seen)   Urine Mucous Present   Urine Yeast NS (Not Seen)   Urine Trichomonas Not Present   Urine Cystals NS (Not Seen)   Urine Casts Hyaline   Urine Sperm Not Present    PROCEDURES:          Urinalysis w/Scope - 81001 Dipstick Dipstick Cont'd Micro  Color: Amber Bilirubin: Neg mg/dL WBC/hpf: 0 - 5/hpf  Appearance: Clear Ketones: Neg mg/dL RBC/hpf: NS (Not Seen)  Specific Gravity: 1.025 Blood: Neg ery/uL Bacteria: NS (Not Seen)  pH: 6.0 Protein: 3+ mg/dL Cystals: NS (Not Seen)  Glucose: Neg mg/dL Urobilinogen: 0.2 mg/dL Casts: Hyaline    Nitrites: Neg Trichomonas: Not Present    Leukocyte Esterase: Neg leu/uL Mucous: Present      Epithelial Cells: NS (Not Seen)      Yeast: NS (Not Seen)      Sperm: Not Present    ASSESSMENT:      ICD-10 Details  1 GU:   Prostate Cancer - C61    PLAN:           Schedule Return Visit/Planned Activity: Keep Scheduled Appointment - Schedule Surgery          Document Letter(s):  Created for Patient: Clinical Summary         Notes:   There are no changes in the patients history or physical exam since last evaluation  by Dr. Louis Meckel. Pt is scheduled to undergo seeds and space oar placement on 06/21/20.   All pt's questions were answered to the best of my ability.

## 2020-06-21 NOTE — Op Note (Signed)
Preoperative diagnosis:  Clinical stage TI C adenocarcinoma the prostate  Postoperative diagnosis:  Same  Procedure:  #1 I-125 prostate seed implantation  #2 cystourethroscopy #3 instillation of SpaceOAR biogel  Surgeon: Louis Meckel, M.D. Radiation Oncologist: Tyler Pita, M.D.  Anesthesia: Gen.   Indications: Patient  was diagnosed with clinical stage TI C prostate cancer. We had extensive discussion with him about treatment options versus. He elected to proceed with seed implantation. He underwent consultation my office as well as with Dr. Tyler Pita. He appeared to understand the advantages disadvantages potential risks of this treatment option. Full informed consent has been obtained. The patient is had preoperative ciprofloxacin. PAS compression boots were placed.   Technique and findings: Patient was brought the operating room where he had successful induction of general anesthesia. He was placed in lithotomy position and prepped and draped in usual manner. Appropriate surgical timeout was performed. Radiation oncology department placed a transrectal ultrasound probe anchoring stand.  It became clear that the cyst that the patient's left base need to be aspirated in order to allow proper positioning of the radiation seeds.  This was done transperineally with a 10 cc syringe.  3 mL of cystic fluid was aspirated.  Foley catheter with contrast in the balloon was inserted without difficulty. Anchoring needles were placed within the prostate.  Real-time contouring of the urethra prostate and rectum were performed and the dosing parameters were established. Targeted dose was 145 gray. We then came to the operating suite suite for placement of the needles. A second timeout was performed. All needle passage was done with real-time transrectal ultrasound guidance with the sagittal plane. A total of 21 needles were placed.  69 active seeds were implanted.  The brachytherapy template was then  removed.   A site in the midline was selected on the perineum for placement of an 18 g needle with saline.  The needle was advanced above the rectum and below Denonvillier's fascia to the mid gland and confirmed to be in the midline on transverse imaging.  One cc of saline was injected confirming appropriate expansion of this space.  A total of 5 cc of saline was then injected to open the space further bilaterally.  The saline syringe was then removed and the SpaceOAR hydrogel was injected with good distribution bilaterally.A Foley catheter was removed and flexible cystoscopy failed to show any seeds outside the prostate.

## 2020-06-21 NOTE — Anesthesia Preprocedure Evaluation (Signed)
Anesthesia Evaluation  Patient identified by MRN, date of birth, ID band Patient awake    Reviewed: Allergy & Precautions, H&P , NPO status , Patient's Chart, lab work & pertinent test results  Airway Mallampati: II   Neck ROM: full    Dental no notable dental hx.    Pulmonary former smoker,    breath sounds clear to auscultation       Cardiovascular hypertension, Pt. on medications + CAD and + CABG  Normal cardiovascular exam Rhythm:regular Rate:Normal     Neuro/Psych PSYCHIATRIC DISORDERS Anxiety    GI/Hepatic negative GI ROS, Neg liver ROS,   Endo/Other  diabetes, Type 2, Insulin Dependent  Renal/GU Renal InsufficiencyRenal disease     Musculoskeletal  (+) Arthritis ,   Abdominal (+) + obese,   Peds  Hematology   Anesthesia Other Findings   Reproductive/Obstetrics                             Anesthesia Physical  Anesthesia Plan  ASA: III  Anesthesia Plan: General   Post-op Pain Management:    Induction: Intravenous  PONV Risk Score and Plan: 2 and Ondansetron, Dexamethasone and Treatment may vary due to age or medical condition  Airway Management Planned: LMA  Additional Equipment:   Intra-op Plan:   Post-operative Plan: Extubation in OR  Informed Consent: I have reviewed the patients History and Physical, chart, labs and discussed the procedure including the risks, benefits and alternatives for the proposed anesthesia with the patient or authorized representative who has indicated his/her understanding and acceptance.       Plan Discussed with: CRNA  Anesthesia Plan Comments:         Anesthesia Quick Evaluation

## 2020-06-21 NOTE — Interval H&P Note (Signed)
History and Physical Interval Note:  06/21/2020 1:05 PM  Alec [name]  has presented today for surgery, with the diagnosis of PROSTATE CANCER.  The various methods of treatment have been discussed with the patient and family. After consideration of risks, benefits and other options for treatment, the patient has consented to  Procedure(s): RADIOACTIVE SEED IMPLANT/BRACHYTHERAPY IMPLANT (N/A) SPACE OAR INSTILLATION (N/A) as a surgical intervention.  The patient's history has been reviewed, patient examined, no change in status, stable for surgery.  I have reviewed the patient's chart and labs.  Questions were answered to the patient's satisfaction.     Ardis Hughs

## 2020-06-21 NOTE — Anesthesia Postprocedure Evaluation (Signed)
Anesthesia Post Note  Patient: Alec [name]  Procedure(s) Performed: RADIOACTIVE SEED IMPLANT/BRACHYTHERAPY IMPLANT (N/A ) SPACE OAR INSTILLATION (N/A )     Patient location during evaluation: Phase II Anesthesia Type: General Level of consciousness: awake Pain management: pain level controlled Vital Signs Assessment: post-procedure vital signs reviewed and stable Respiratory status: spontaneous breathing Cardiovascular status: stable Postop Assessment: no apparent nausea or vomiting Anesthetic complications: no   No complications documented.  Last Vitals:  Vitals:   06/21/20 1445 06/21/20 1500  BP: (!) 161/69 (!) 148/66  Pulse: 63 (!) 55  Resp: 16 18  Temp:    SpO2: 90% 99%    Last Pain:  Vitals:   06/21/20 1500  TempSrc:   PainSc: 0-No pain   Pain Goal: Patients Stated Pain Goal: 5 (06/21/20 1500)                 Huston Foley

## 2020-06-21 NOTE — Transfer of Care (Signed)
Immediate Anesthesia Transfer of Care Note  Patient: Alec [name]  Procedure(s) Performed: RADIOACTIVE SEED IMPLANT/BRACHYTHERAPY IMPLANT (N/A ) SPACE OAR INSTILLATION (N/A )  Patient Location: PACU  Anesthesia Type:General  Level of Consciousness: awake, alert , oriented and patient cooperative  Airway & Oxygen Therapy: Patient Spontanous Breathing  Post-op Assessment: Report given to RN and Post -op Vital signs reviewed and stable  Post vital signs: Reviewed and stable  Last Vitals:  Vitals Value Taken Time  BP 153/126 06/21/20 1430  Temp    Pulse 67 06/21/20 1431  Resp 13 06/21/20 1431  SpO2 91 % 06/21/20 1431  Vitals shown include unvalidated device data.  Last Pain:  Vitals:   06/21/20 1125  TempSrc: Oral  PainSc: 0-No pain      Patients Stated Pain Goal: 5 (86/75/44 9201)  Complications: No complications documented.

## 2020-06-22 ENCOUNTER — Encounter (HOSPITAL_BASED_OUTPATIENT_CLINIC_OR_DEPARTMENT_OTHER): Payer: Self-pay | Admitting: Urology

## 2020-06-25 NOTE — Progress Notes (Signed)
  Radiation Oncology         [phone] ________________________________  Name: Alec [name] MRN: [id number]  Date: 06/25/2020  DOB: 1946/04/06       Prostate Seed Implant  [id number], Nathen May, MD  No ref. provider found  DIAGNOSIS:  Oncology History  Malignant neoplasm of prostate ([place])  03/14/2020 Cancer Staging   Staging form: Prostate, AJCC 8th Edition - Clinical stage from 03/14/2020: Stage IIB (cT1c, cN0, cM0, PSA: 7.3, Grade Group: 2) - Signed by Freeman Caldron, PA-C on 04/18/2020   04/18/2020 Initial Diagnosis   Malignant neoplasm of prostate ([place])       ICD-10-CM   1. Malignant neoplasm of prostate [facility])  C61 Discharge patient    PROCEDURE: Insertion of radioactive I-125 seeds into the prostate gland.  RADIATION DOSE: 145 Gy, definitive therapy.  TECHNIQUE: [name] was brought to the operating room with the urologist. He was placed in the dorsolithotomy position. He was catheterized and a rectal tube was inserted. The perineum was shaved, prepped and draped. The ultrasound probe was then introduced into the rectum to see the prostate gland.  TREATMENT DEVICE: A needle grid was attached to the ultrasound probe stand and anchor needles were placed.  3D PLANNING: The prostate was imaged in 3D using a sagittal sweep of the prostate probe. These images were transferred to the planning computer. There, the prostate, urethra and rectum were defined on each axial reconstructed image. Then, the software created an optimized 3D plan and a few seed positions were adjusted. The quality of the plan was reviewed using [facility] information for the target and the following two organs at risk:  Urethra and Rectum.  Then the accepted plan was printed and handed off to the radiation therapist.  Under my supervision, the custom loading of the seeds and spacers was carried out and loaded into sealed vicryl sleeves.  These pre-loaded needles were then placed into the needle  holder.Marland Kitchen  PROSTATE VOLUME STUDY:  Using transrectal ultrasound the volume of the prostate was verified to be 42.7 cc.  SPECIAL TREATMENT PROCEDURE/SUPERVISION AND HANDLING: The pre-loaded needles were then delivered under sagittal guidance. A total of 21 needles were used to deposit 69 seeds in the prostate gland. The individual seed activity was 0.494 mCi.  SpaceOAR:  Yes  COMPLEX SIMULATION: At the end of the procedure, an anterior radiograph of the pelvis was obtained to document seed positioning and count. Cystoscopy was performed to check the urethra and bladder.  MICRODOSIMETRY: At the end of the procedure, the patient was emitting 0.07 mR/hr at 1 meter. Accordingly, he was considered safe for hospital discharge.  PLAN: The patient will return to the radiation oncology clinic for post implant CT dosimetry in three weeks.   ________________________________  Sheral Apley Tammi Klippel, M.D.

## 2020-07-04 DIAGNOSIS — J3489 Other specified disorders of nose and nasal sinuses: Secondary | ICD-10-CM | POA: Insufficient documentation

## 2020-07-04 DIAGNOSIS — G4733 Obstructive sleep apnea (adult) (pediatric): Secondary | ICD-10-CM | POA: Diagnosis not present

## 2020-07-04 DIAGNOSIS — J342 Deviated nasal septum: Secondary | ICD-10-CM | POA: Diagnosis not present

## 2020-07-04 DIAGNOSIS — K219 Gastro-esophageal reflux disease without esophagitis: Secondary | ICD-10-CM | POA: Insufficient documentation

## 2020-07-04 DIAGNOSIS — Z9989 Dependence on other enabling machines and devices: Secondary | ICD-10-CM | POA: Diagnosis not present

## 2020-07-11 ENCOUNTER — Telehealth: Payer: Self-pay | Admitting: *Deleted

## 2020-07-11 NOTE — Progress Notes (Signed)
Radiation Oncology         (336) [phone] ________________________________  Name: Alec [name] MRN: [id number]  Date: 07/12/2020  DOB: Feb 01, 1946  Post-Seed Follow-Up Visit Note  CC: Mayra Neer, MD  Ardis Hughs, MD  Diagnosis:   74 y.o. gentleman with Stage T1c adenocarcinoma of the prostate with Gleason score of 3+4, and PSA of 7.27.    ICD-10-CM   1. Malignant neoplasm of prostate (HCC)  C61     Interval Since Last Radiation:  3 weeks 06/21/20:  Insertion of radioactive I-125 seeds into the prostate gland; 145 Gy, definitive therapy with placement of SpaceOAR VUE gel.  Narrative:  The patient returns today for routine follow-up.  He is complaining of increased urinary frequency and urinary hesitation symptoms. He filled out a questionnaire regarding urinary function today providing and overall IPSS score of 17 characterizing his symptoms as moderate with nocturia x3, weak stream and intermittency.  He denies dysuria but is seeing occasional spots of blood on his shorts in the mornings but denies gross hematuria  His pre-implant score was 2. He denies any abdominal pain or bowel symptoms aside from occasional constipation that is relieved with stool softeners prn.  He reports a healthy appetite and is maintaining his weight.  He denies any significant change in his energy level and is overall pleased with his progress to date.  ALLERGIES:  has No Known Allergies.  Meds: Current Outpatient Medications  Medication Sig Dispense Refill  . allopurinol (ZYLOPRIM) 100 MG tablet Take 200 mg by mouth daily.     Marland Kitchen amLODipine (NORVASC) 10 MG tablet Take 1 tablet (10 mg total) by mouth daily. 90 tablet 3  . aspirin EC 81 MG tablet Take 81 mg by mouth daily.    Marland Kitchen atorvastatin (LIPITOR) 40 MG tablet Take 1 tablet (40 mg total) by mouth daily. Office visit is needed prior to any more refills. (Patient taking differently: Take 40 mg by mouth at bedtime. Office visit is needed prior to  any more refills.) 30 tablet 0  . Blood Glucose Monitoring Suppl (FREESTYLE FREEDOM LITE) w/Device KIT Use to check blood sugar three times daily. DX:E11.65 1 kit 0  . [place]-Glucosamine-Vit D (OSTEO BI-FLEX ONE PER DAY PO) Take by mouth.    . Cholecalciferol (VITAMIN D3 PO) Take by mouth daily.     Marland Kitchen CINNAMON PO Take by mouth.    . cloNIDine (CATAPRES) 0.1 MG tablet Take 1 tablet (0.1 mg total) by mouth 2 (two) times daily. (Patient taking differently: Take 0.1 mg by mouth 2 (two) times daily. ) 60 tablet 10  . folic acid (FOLVITE) 1 MG tablet Take 1 tablet (1 mg total) by mouth daily. 90 tablet 3  . furosemide (LASIX) 20 MG tablet Take 1 tablet (20 mg total) by mouth as needed for edema. 30 tablet 4  . Glucosamine-Chondroitin (OSTEO BI-FLEX REGULAR STRENGTH PO) Take 1 tablet by mouth daily after supper.    Marland Kitchen glucose blood (FREESTYLE LITE) test strip USE AS INSTRUCTED TO TEST BLOOD SUGARS 3 TIMES DAILY. DX CODE-E11.65 (PAY FOR 100 IN 90 DAYS) 100 strip 3  . hydrALAZINE (APRESOLINE) 50 MG tablet Take 1 tablet (50 mg total) by mouth 3 (three) times daily. (Patient taking differently: Take 50 mg by mouth 3 (three) times daily. ) 270 tablet 3  . inFLIXimab (REMICADE) 100 MG injection Inject into the vein every 3 (three) months.     . insulin lispro (HUMALOG) 100 UNIT/ML injection Use max of 140  units daily via insulin pump. (Patient taking differently: Use max of 140 units daily via insulin pump. ) 130 mL 1  . metoprolol tartrate (LOPRESSOR) 50 MG tablet Take 1 tablet (50 mg total) by mouth 2 (two) times daily. (Patient taking differently: Take 50 mg by mouth 2 (two) times daily. ) 90 tablet 1  . Misc Natural Products (TART CHERRY ADVANCED PO) Take by mouth.    . Multiple Vitamins-Minerals (CENTRUM SILVER PO) Take by mouth daily.     . nitroGLYCERIN (NITROSTAT) 0.4 MG SL tablet Place 1 tablet (0.4 mg total) under the tongue every 5 (five) minutes as needed for chest pain. 30 tablet 1  .  phenazopyridine (PYRIDIUM) 200 MG tablet Take 1 tablet (200 mg total) by mouth 3 (three) times daily as needed for pain. 10 tablet 0  . sertraline (ZOLOFT) 25 MG tablet Take 12.5 mg by mouth daily.     . sildenafil (REVATIO) 20 MG tablet Take 20 mg by mouth 3 (three) times daily. (Patient not taking: Reported on 06/19/2020)    . tamsulosin (FLOMAX) 0.4 MG CAPS capsule Take 1 capsule (0.4 mg total) by mouth daily. 30 capsule 5  . traMADol (ULTRAM) 50 MG tablet Take 1-2 tablets (50-100 mg total) by mouth every 6 (six) hours as needed for moderate pain. 15 tablet 0   No current facility-administered medications for this visit.    Physical Findings: In general this is a well appearing Caucasian male in no acute distress. He's alert and oriented x4 and appropriate throughout the examination. Cardiopulmonary assessment is negative for acute distress and he exhibits normal effort.   Lab Findings: Lab Results  Component Value Date   WBC 4.5 06/19/2020   HGB 13.6 06/19/2020   HCT 39.6 06/19/2020   MCV 91.2 06/19/2020   PLT 148 (L) 06/19/2020    Radiographic Findings:  Patient underwent CT imaging in our clinic for post implant dosimetry. The CT will be reviewed by Dr. Tammi Klippel to confirm there is an adequate distribution of radioactive seeds throughout the prostate gland and ensure that there are no seeds in or near the rectum. We suspect the final radiation plan and dosimetry will show appropriate coverage of the prostate gland. He understands that we will call and inform him of any unexpected findings on further review of his imaging and dosimetry.  Impression/Plan: 74 y.o. gentleman with Stage T1c adenocarcinoma of the prostate with Gleason score of 3+4, and PSA of 7.27. The patient is recovering from the effects of radiation. His urinary symptoms should gradually improve over the next 4-6 months. We talked about this today. He is encouraged by his improvement already and is otherwise pleased with  his outcome. We also talked about long-term follow-up for prostate cancer following seed implant. He understands that ongoing PSA determinations and digital rectal exams will help perform surveillance to rule out disease recurrence. He has a follow up appointment scheduled with Dr. Louis Meckel on 07/13/2020. He understands what to expect with his PSA measures. Patient was also educated today about some of the long-term effects from radiation including a small risk for rectal bleeding and possibly erectile dysfunction. We talked about some of the general management approaches to these potential complications. However, I did encourage the patient to contact our office or return at any point if he has questions or concerns related to his previous radiation and prostate cancer.    Nicholos Johns, PA-C

## 2020-07-11 NOTE — Telephone Encounter (Signed)
CALLED PATIENT TO REMIND OF POST SEED APPTS. FOR 07-12-20, SPOKE WITH PATIENT AND HE IS AWARE OF THESE APPTS.

## 2020-07-12 ENCOUNTER — Encounter: Payer: Self-pay | Admitting: Urology

## 2020-07-12 ENCOUNTER — Ambulatory Visit
Admission: RE | Admit: 2020-07-12 | Discharge: 2020-07-12 | Disposition: A | Discharge status: Home / Self Care | Payer: Medicare Other | Source: Ambulatory Visit | Attending: Urology | Admitting: Urology

## 2020-07-12 ENCOUNTER — Ambulatory Visit
Admission: RE | Admit: 2020-07-12 | Discharge: 2020-07-12 | Disposition: A | Discharge status: Home / Self Care | Payer: Medicare Other | Source: Ambulatory Visit | Attending: Radiation Oncology | Admitting: Radiation Oncology

## 2020-07-12 ENCOUNTER — Other Ambulatory Visit: Payer: Self-pay

## 2020-07-12 VITALS — BP 140/65 | HR 56 | Temp 97.8°F | Resp 18 | Ht 66.0 in | Wt 199.6 lb

## 2020-07-12 DIAGNOSIS — Z7982 Long term (current) use of aspirin: Secondary | ICD-10-CM | POA: Insufficient documentation

## 2020-07-12 DIAGNOSIS — R3911 Hesitancy of micturition: Secondary | ICD-10-CM | POA: Diagnosis not present

## 2020-07-12 DIAGNOSIS — C61 Malignant neoplasm of prostate: Secondary | ICD-10-CM

## 2020-07-12 DIAGNOSIS — R350 Frequency of micturition: Secondary | ICD-10-CM | POA: Insufficient documentation

## 2020-07-12 DIAGNOSIS — Z510 Encounter for antineoplastic radiation therapy: Secondary | ICD-10-CM | POA: Diagnosis not present

## 2020-07-12 DIAGNOSIS — Z79899 Other long term (current) drug therapy: Secondary | ICD-10-CM | POA: Diagnosis not present

## 2020-07-12 NOTE — Progress Notes (Signed)
  Radiation Oncology         [phone] ________________________________  Name: Alec [name] MRN: [id number]  Date: 07/12/2020  DOB: 09-10-1946  COMPLEX SIMULATION NOTE  NARRATIVE:  The patient was brought to the [facility] today following prostate seed implantation approximately one month ago.  Identity was confirmed.  All relevant records and images related to the planned course of therapy were reviewed.  Then, the patient was set-up supine.  CT images were obtained.  The CT images were loaded into the planning software.  Then the prostate and rectum were contoured.  Treatment planning then occurred.  The implanted iodine 125 seeds were identified by the physics staff for projection of radiation distribution  I have requested : 3D Simulation  I have requested a DVH of the following structures: Prostate and rectum.    ________________________________  Sheral Apley Tammi Klippel, M.D.

## 2020-07-12 NOTE — Progress Notes (Signed)
Received patient in the clinic following post seed CT for follow up with Ashlyn Bruning, PA-C. Weight and vitals stable. Denies pain. Pre seed IPSS 17. Post seed IPSS 9. Denies dysuria or urinary leakage. Reports continued hematuria. Explains he sees a few drops of blood in his urine most mornings. Reports he continues to take a baby aspirin once per day. Patient scheduled to follow up with [facility] tomorrow. Patient without bowel complaints.   BP 140/65   Pulse (!) 56   Temp 97.8 F (36.6 C)   Resp 18   Ht 5\' 6"  (1.676 m)   Wt 199 lb 9.6 oz (90.5 kg)   SpO2 98%   BMI 32.22 kg/m  Wt Readings from Last 3 Encounters:  07/12/20 199 lb 9.6 oz (90.5 kg)  06/21/20 195 lb (88.5 kg)  06/19/20 199 lb 7 oz (90.5 kg)

## 2020-07-13 DIAGNOSIS — C61 Malignant neoplasm of prostate: Secondary | ICD-10-CM | POA: Diagnosis not present

## 2020-07-14 ENCOUNTER — Encounter: Payer: Self-pay | Admitting: Medical Oncology

## 2020-07-18 ENCOUNTER — Ambulatory Visit (HOSPITAL_BASED_OUTPATIENT_CLINIC_OR_DEPARTMENT_OTHER): Payer: Medicare Other | Attending: Internal Medicine | Admitting: Internal Medicine

## 2020-07-18 ENCOUNTER — Other Ambulatory Visit: Payer: Self-pay

## 2020-07-18 DIAGNOSIS — G4710 Hypersomnia, unspecified: Secondary | ICD-10-CM | POA: Insufficient documentation

## 2020-07-18 DIAGNOSIS — G4736 Sleep related hypoventilation in conditions classified elsewhere: Secondary | ICD-10-CM | POA: Diagnosis not present

## 2020-07-18 DIAGNOSIS — R0681 Apnea, not elsewhere classified: Secondary | ICD-10-CM | POA: Insufficient documentation

## 2020-07-18 DIAGNOSIS — R0683 Snoring: Secondary | ICD-10-CM | POA: Insufficient documentation

## 2020-07-18 DIAGNOSIS — G4761 Periodic limb movement disorder: Secondary | ICD-10-CM | POA: Insufficient documentation

## 2020-07-18 DIAGNOSIS — R0902 Hypoxemia: Secondary | ICD-10-CM | POA: Diagnosis not present

## 2020-07-20 ENCOUNTER — Other Ambulatory Visit: Payer: Self-pay

## 2020-07-20 ENCOUNTER — Encounter: Payer: Self-pay | Admitting: Dietician

## 2020-07-20 ENCOUNTER — Encounter: Payer: Medicare Other | Attending: Endocrinology | Admitting: Dietician

## 2020-07-20 DIAGNOSIS — E1169 Type 2 diabetes mellitus with other specified complication: Secondary | ICD-10-CM | POA: Diagnosis not present

## 2020-07-20 DIAGNOSIS — Z794 Long term (current) use of insulin: Secondary | ICD-10-CM | POA: Insufficient documentation

## 2020-07-20 DIAGNOSIS — N1832 Chronic kidney disease, stage 3b: Secondary | ICD-10-CM | POA: Insufficient documentation

## 2020-07-20 NOTE — Progress Notes (Signed)
  Medical Nutrition Therapy:  Appt start time: 0930 end time:  1030.   Assessment:  Primary concerns today:  Patient is here today with his wife.  They have been referred for Type 2 Diabetes by Dr. Dwyane Dee.  He states that he needs to lose weight.  History includes:  Type 2 diabetes (2000), HLT, HTN, CKD He uses a T-Slim insulin pump with a Dexcom CGM. Labs noted to include A1C 6.1% 05/22/2020, BUN 26, Creatinine 1.92, Potassium 4.1 06/19/2020  Weight hx: 197 lbs 06/2020 204 lbs 05/24/2020 182 lbs about 3 years ago.  Patient lives with his wife.  She does the shopping and cooking. He used to be a Administrator.  Preferred Learning Style:   No preference indicated   Learning Readiness:   Contemplating  DIETARY INTAKE:  24-hr recall:  B ( AM): sausage and egg biscuit today OR pancakes, regular syrup OR sausage and eggs and toast or biscuit   Snk ( AM): none  L ( PM): sandwich OR potpie OR leftovers Snk ( PM): occasional ice cream ([place]ondike bar) or fudgesickle or fruit bar or ice creams sandwich D ( PM): [place] OR steak, french fries (fried) or baked potato, salad, bread Snk ( PM): occasional OJ if blood glucose less than 80 Beverages: water, diet caffeine free Coke,zero gatorade  Usual physical activity: mows own yard and yard work [employer] but hasn't gone since [employer]. Has a stationary bike but does not like it.  Estimated energy needs: 1800 calories 60 g protein  Progress Towards Goal(s):  In progress.   Nutritional Diagnosis:  NB-1.1 Food and nutrition-related knowledge deficit As related to balance of carbohydrate, protein, and fat for diabetes and CKD.  As evidenced by diet hx and patient report.    Intervention:  Nutrition education related to guidelines for CKD and Type 2 diabetes.  Discussed recommendations to decrease meat portion, fat content of meat and how foods are prepared.  Discussed recommendations to decrease sodium.  Discussed better beverage choices  based on his renal function.  Discussed basic meal planning. Discussed benefits of increased activity.  Plan: Aim to be more active.  Aim for 30 minutes most days (raking leaves, other yard work, walking, etc).  Decrease your meat portion to the size of a deck of cards. Choose lean meat (take skin off chicken and trim meat), eat beef and pork less often. Bake rather than fry.  Drink water most often.  Choose a diet sprite or diet gingerale rather than diet coke.  Avoid gatorade. Unsweetened tea is also a choice.  Avoid added salt.  Limit those foods with sugar. Consider fruit on your pancake rather than regular syrup. Stop eating when you are satisfied.  Listen to your body.  Less grease, less sugar  Teaching Method Utilized:  Visual Auditory  Handouts given during visit include:  NKD:  National Kidney Diet placemat for those not needing dialysis from AND  Label reading tips  Barriers to learning/adherence to lifestyle change: none  Demonstrated degree of understanding via:  Teach Back   Monitoring/Evaluation:  Dietary intake, exercise, label reading, and body weight prn.

## 2020-07-20 NOTE — Patient Instructions (Signed)
Plan: Aim to be more active.  Aim for 30 minutes most days (raking leaves, other yard work, walking, etc).  Decrease your meat portion to the size of a deck of cards. Choose lean meat (take skin off chicken and trim meat), eat beef and pork less often. Bake rather than fry.  Drink water most often.  Choose a diet sprite or diet gingerale rather than diet coke.  Avoid gatorade. Unsweetened tea is also a choice.  Avoid added salt.  Limit those foods with sugar. Consider fruit on your pancake rather than regular syrup. Stop eating when you are satisfied.  Listen to your body.  Less grease, less sugar

## 2020-07-24 ENCOUNTER — Encounter: Payer: Self-pay | Admitting: Radiation Oncology

## 2020-07-24 DIAGNOSIS — Z510 Encounter for antineoplastic radiation therapy: Secondary | ICD-10-CM | POA: Diagnosis not present

## 2020-07-24 DIAGNOSIS — G4710 Hypersomnia, unspecified: Secondary | ICD-10-CM | POA: Diagnosis not present

## 2020-07-24 DIAGNOSIS — C61 Malignant neoplasm of prostate: Secondary | ICD-10-CM | POA: Diagnosis not present

## 2020-07-24 NOTE — Procedures (Signed)
NAME: Alec [name] DATE OF BIRTH:  [birth year] MEDICAL RECORD NUMBER [id number]  LOCATION: [facility]  PHYSICIAN: Marius Ditch  DATE OF STUDY: 07/18/2020  SLEEP STUDY TYPE: Nocturnal Polysomnogram               REFERRING PHYSICIAN: Marius Ditch, MD  INDICATION FOR STUDY: History of severe OSA (AHI 44/hr, O2 min 83%) on HSAT. Unable to tolerate CPAP. PSG done and showed minimal sleep disordered breathing. Patient thought sleep that night was poorer than usual. Therefore, this study done with sleep aid.   EPWORTH SLEEPINESS SCORE:   HEIGHT: 5\' 6"  (167.6 cm)  WEIGHT: 200 lb (90.7 kg)    Body mass index is 32.28 kg/m.  NECK SIZE: 15.5 in.  MEDICATIONS Patient self administered medications include: ZOLPIDEM TARTRATE. Medications administered during study include No sleep medicine administered.Marland Kitchen  SLEEP STUDY TECHNIQUE A multi-channel overnight Polysomnography study was performed. The channels recorded and monitored were central and occipital EEG, electrooculogram (EOG), submentalis EMG (chin), nasal and oral airflow, thoracic and abdominal wall motion, anterior tibialis EMG, snore microphone, electrocardiogram, and a pulse oximetry.  TECHNICAL COMMENTS Comments added by Technician: NONE Comments added by Scorer: N/A  SLEEP ARCHITECTURE The study was initiated at 9:46:46 PM and terminated at 4:48:08 AM. The total recorded time was 421.4 minutes. EEG confirmed total sleep time was 295 minutes yielding a sleep efficiency of 70.0%%. Sleep onset after lights out was 19.8 minutes with a REM latency of 136.0 minutes. The patient spent 20.3%% of the night in stage N1 sleep, 65.8%% in stage N2 sleep, 0.0%% in stage N3 and 13.9% in REM. Wake after sleep onset (WASO) was 106.5 minutes. The Arousal Index was 28.3/hour.  RESPIRATORY PARAMETERS There were a total of 12 respiratory disturbances out of which 2 were apneas ( 0 obstructive, 0 mixed, 2 central) and 10  hypopneas. The apnea/hypopnea index (AHI) was 2.4 events/hour. The central sleep apnea index was 0.4 events/hour. The REM AHI was 2.9 events/hour and NREM AHI was 2.4 events/hour. The supine AHI was 0.0 events/hour and the non supine AHI was 2.5 events/hour. Respiratory disturbance index (RDI) was 11.6 events/hour overall and 10.2 events/hour in REM. Respiratory disturbances were associated with oxygen desaturation down to a nadir of 86.0% during sleep. The mean oxygen saturation during the study was 91.8%. The cumulative time under 88% oxygen saturation was 8.4 minutes.  LEG MOVEMENT DATA The total leg movements were 82 with a resulting leg movement index of 16.7/hr . Associated arousal with leg movement index was 4.1/hr.  CARDIAC DATA The underlying cardiac rhythm was most consistent with sinus rhythm. Mean heart rate during sleep was 49.5 bpm. Additional rhythm abnormalities include PVCs.  IMPRESSIONS - No Significant Obstructive Sleep apnea (OSA) based on AHI; Mild Obstructive Sleep Apnea based on RDI - No Significant Central Sleep Apnea (CSA) - Mild Oxygen Desaturation - Periodic leg movements(PLMs) seen during sleep. However, few significant associated arousals.  DIAGNOSIS - Based on AHI, the patient does not have OSA.  - Nocturnal Hypoxemia (G47.36)  RECOMMENDATIONS - No CPAP is recommended at this time. Oddly, the patients HST showed severe OSA, but two PSGs have shown minimal sleep disordered breathing. At this point, I would conclude that treatment for sleep disordered breathing is not necessary.    Marius Ditch Sleep specialist, [place] Board of Internal Medicine  ELECTRONICALLY SIGNED ON:  07/24/2020, 8:10 PM [place]: [phone]   FX: [phone] ACCREDITED  BY THE AMERICAN ACADEMY OF SLEEP MEDICINE

## 2020-08-01 DIAGNOSIS — G4700 Insomnia, unspecified: Secondary | ICD-10-CM | POA: Diagnosis not present

## 2020-08-03 ENCOUNTER — Other Ambulatory Visit: Payer: Self-pay

## 2020-08-03 MED ORDER — FUROSEMIDE 20 MG PO TABS: 20.0000 mg | ORAL_TABLET | ORAL | 4 refills | Status: DC | PRN

## 2020-08-08 ENCOUNTER — Other Ambulatory Visit: Payer: Self-pay | Admitting: Cardiology

## 2020-08-28 ENCOUNTER — Other Ambulatory Visit: Payer: Self-pay | Admitting: *Deleted

## 2020-08-28 MED ORDER — ATORVASTATIN CALCIUM 40 MG PO TABS
40.0000 mg | ORAL_TABLET | Freq: Every day | ORAL | 2 refills | Status: DC
Start: 2020-08-28 — End: 2020-11-03

## 2020-08-28 NOTE — Telephone Encounter (Signed)
Rx has been sent to the pharmacy electronically. ° °

## 2020-09-04 NOTE — Progress Notes (Signed)
  Radiation Oncology         (336) [phone] ________________________________  Name: Alec [name] MRN: [id number]  Date: 07/24/2020  DOB: 24-Jun-1946  3D Planning Note   Prostate Brachytherapy Post-Implant Dosimetry  Diagnosis: 74 y.o. gentleman with Stage T1c adenocarcinoma of the prostate with Gleason score of 3+4, and PSA of 7.27.  Narrative: On a previous date, Alec [name] returned following prostate seed implantation for post implant planning. He underwent CT scan complex simulation to delineate the three-dimensional structures of the pelvis and demonstrate the radiation distribution.  Since that time, the seed localization, and complex isodose planning with dose volume histograms have now been completed.  Results:   Prostate Coverage - The dose of radiation delivered to the 90% or more of the prostate gland (D90) was 101.47% of the prescription dose. This exceeds our goal of greater than 90%. Rectal Sparing - The volume of rectal tissue receiving the prescription dose or higher was 0.0 cc. This falls under our thresholds tolerance of 1.0 cc.  Impression: The prostate seed implant appears to show adequate target coverage and appropriate rectal sparing.  Plan:  The patient will continue to follow with urology for ongoing PSA determinations. I would anticipate a high likelihood for local tumor control with minimal risk for rectal morbidity.  ________________________________  Sheral Apley Tammi Klippel, M.D.

## 2020-09-11 ENCOUNTER — Other Ambulatory Visit: Payer: Self-pay | Admitting: Cardiology

## 2020-09-11 ENCOUNTER — Other Ambulatory Visit: Payer: Self-pay

## 2020-09-11 ENCOUNTER — Other Ambulatory Visit (INDEPENDENT_AMBULATORY_CARE_PROVIDER_SITE_OTHER): Payer: Medicare Other

## 2020-09-11 DIAGNOSIS — E1165 Type 2 diabetes mellitus with hyperglycemia: Secondary | ICD-10-CM

## 2020-09-11 DIAGNOSIS — Z794 Long term (current) use of insulin: Secondary | ICD-10-CM

## 2020-09-11 LAB — BASIC METABOLIC PANEL
BUN: 21 mg/dL (ref 6–23)
CO2: 26 mEq/L (ref 19–32)
Calcium: 9.5 mg/dL (ref 8.4–10.5)
Chloride: 105 mEq/L (ref 96–112)
Creatinine, Ser: 1.99 mg/dL — ABNORMAL HIGH (ref 0.40–1.50)
GFR: 33.01 mL/min — ABNORMAL LOW (ref >60.00)
Glucose, Bld: 146 mg/dL — ABNORMAL HIGH (ref 70–99)
Potassium: 3.8 mEq/L (ref 3.5–5.1)
Sodium: 139 mEq/L (ref 135–145)

## 2020-09-11 LAB — HEMOGLOBIN A1C: Hgb A1c MFr Bld: 5.9 % (ref 4.6–6.5)

## 2020-09-11 MED ORDER — METOPROLOL TARTRATE 50 MG PO TABS: 50.0000 mg | ORAL_TABLET | Freq: Two times a day (BID) | ORAL | 1 refills | Status: DC

## 2020-09-11 NOTE — Telephone Encounter (Signed)
This is Dr. Hochrein's pt. °

## 2020-09-14 ENCOUNTER — Encounter: Payer: Self-pay | Admitting: Endocrinology

## 2020-09-14 ENCOUNTER — Other Ambulatory Visit: Payer: Self-pay

## 2020-09-14 ENCOUNTER — Ambulatory Visit (INDEPENDENT_AMBULATORY_CARE_PROVIDER_SITE_OTHER): Payer: Medicare Other | Admitting: Endocrinology

## 2020-09-14 VITALS — BP 142/80 | HR 63 | Ht 67.0 in | Wt 206.2 lb

## 2020-09-14 DIAGNOSIS — Z794 Long term (current) use of insulin: Secondary | ICD-10-CM

## 2020-09-14 DIAGNOSIS — E1165 Type 2 diabetes mellitus with hyperglycemia: Secondary | ICD-10-CM

## 2020-09-14 DIAGNOSIS — I2510 Atherosclerotic heart disease of native coronary artery without angina pectoris: Secondary | ICD-10-CM

## 2020-09-14 DIAGNOSIS — E1121 Type 2 diabetes mellitus with diabetic nephropathy: Secondary | ICD-10-CM | POA: Diagnosis not present

## 2020-09-14 MED ORDER — INSULIN LISPRO 100 UNIT/ML ~~LOC~~ SOLN
SUBCUTANEOUS | 1 refills | Status: DC
Start: 2020-09-14 — End: 2021-01-18

## 2020-09-14 NOTE — Patient Instructions (Signed)
Use exercise mode when active

## 2020-09-14 NOTE — Progress Notes (Signed)
Patient ID: Alec [name], male   DOB: 09-Sep-1946, 74 y.o.   MRN: [id number]             Reason for Appointment:  Follow-up for Type 2 Diabetes    History of Present Illness:          Diagnosis: Type 2 diabetes mellitus, date of diagnosis: 2001       Prior history: He was seen in consultation in 11/2014 when his A1c was 7.4 Since his blood sugars were averaging over 200 he was given Victoza in addition to his basal bolus insulin regimen in 6/16 He started on the V go pump in October 2016  Recent history:    INSULIN regimen  on T-Slim pump:    BASAL rate settings:  Midnight = 2.5, 3 AM = 2.2, 8 AM = 2.4, 4 PM = 2.7, 7 PM = 2.8 and 10 PM = 2.4  Boluses 12- 16 units at breakfast, 12 units lunch and about 18 at dinner  total daily insulin dose up to 126 units  Non-insulin hypoglycemic drugs the patient is taking are: Victoza 1.2 mg daily   His A1c is very stable at 5.9   Current blood sugar patterns from analysis of his Dexcom sensor patterns, daily management and problems identified:    Blood sugar may be slightly higher on an average than before  However not showing a consistent pattern from day-to-day  Is having an average blood sugar slightly higher than his previous visit  Time in target recently has not been as good  After breakfast he may have high readings if he is not eating balanced meals and relatively high carbohydrate meals like pop tarts  However a couple of times he has had infusion site issues causing high blood sugars  Has been able to avoid hypoglycemia although may get symptomatic when he is physically active and he forgets to turn on the exercise mode  Generally appears to be gaining a little weight now   Glucose monitoring:  Dexcom   Glycemic patterns overall show basically excellent readings early morning, variable readings including hyperglycemia midday and evening hours and minimal hypoglycemia  HIGHEST blood sugars on average are  between about 9-11 PM when they are above the 180 target but significant variability present  HYPERGLYCEMIA is seen inconsistently in the late afternoon and late evening but not consistently after meals  Postprandial reading may be sometimes higher after breakfast but not consistently  Overnight blood sugars are very stable at least between 4-9 AM without any hypoglycemia   CGM use % of time  95  2-week average/SD  158+/-42  Time in range       73%  % Time Above 180  27  % Time above 250   % Time Below 70 0      PRE-MEAL  overnight  mornings  afternoon  evening Overall  Glucose range:      68-316  Averages:  134  138  151  182+/-48  158 +/-42   Previous data:  CGM use % of time  97  2-week average/SD  144+/-42  Time in range       81%, was 63  % Time Above 180  18  % Time above 250   % Time Below 70 1     PRE-MEAL  overnight  mornings  afternoon  evening Overall  Glucose range:       Averages:  132  142  149+/-43  152+/-40  Self-care:  Meals: 3 meals per day. Lunch  1 pm Dinner 6 pm Breakfast is usually an egg sandwich but sometimes biscuits   Eating balanced meals in the evening.  He will have snacks with granola bars              Dietician visit, most recent: 06/2020.               Weight history:  Wt Readings from Last 3 Encounters:  09/14/20 206 lb 3.2 oz (93.5 kg)  07/20/20 197 lb (89.4 kg)  07/18/20 200 lb (90.7 kg)    Glycemic control:   Lab Results  Component Value Date   HGBA1C 5.9 09/11/2020   HGBA1C 6.1 05/22/2020   HGBA1C 6.2 02/18/2020   Lab Results  Component Value Date   MICROALBUR 191.5 (H) 02/18/2020   LDLCALC 49 06/16/2019   CREATININE 1.99 (H) 09/11/2020     Past history:  He was not symptomatic at time of diagnosis and was probably treated with Amaryl only.  No details are available of previous management However he thinks he was not given metformin at any time because of his "kidney problem" He had a A1c of over 9% in 2013  and may have been started on insulin at that time. Most likely has been taking Levemir and NovoLog since then A1c records show his results were over 8% in 2014 and in 4/15 but subsequently down to 7.1 in November 2015 Amaryl was stopped in 7/16  Other active problems are addressed in Review of systems   Allergies as of 09/14/2020      Reactions   Nsaids Other (See Comments)      Medication List       Accurate as of September 14, 2020 12:57 PM. If you have any questions, ask your nurse or doctor.        STOP taking these medications   NovoLOG 100 UNIT/ML injection Generic drug: insulin aspart Stopped by: Elayne Snare, MD     TAKE these medications   allopurinol 100 MG tablet Commonly known as: ZYLOPRIM Take 200 mg by mouth daily.   amLODipine 10 MG tablet Commonly known as: NORVASC Take 1 tablet (10 mg total) by mouth daily.   aspirin EC 81 MG tablet Take 81 mg by mouth daily.   atorvastatin 40 MG tablet Commonly known as: LIPITOR Take 1 tablet (40 mg total) by mouth daily.   CENTRUM SILVER PO Take by mouth daily.   CINNAMON PO Take by mouth.   cloNIDine 0.1 MG tablet Commonly known as: CATAPRES Take 1 tablet (0.1 mg total) by mouth 2 (two) times daily.   folic acid 1 MG tablet Commonly known as: FOLVITE Take 1 tablet (1 mg total) by mouth daily.   FreeStyle Freedom Lite w/Device Kit Use to check blood sugar three times daily. DX:E11.65   FREESTYLE LITE test strip Generic drug: glucose blood USE AS INSTRUCTED TO TEST BLOOD SUGARS 3 TIMES DAILY. DX CODE-E11.65 (PAY FOR 100 IN 90 DAYS)   furosemide 20 MG tablet Commonly known as: Lasix Take 1 tablet (20 mg total) by mouth as needed for edema.   hydrALAZINE 50 MG tablet Commonly known as: APRESOLINE Take 1 tablet (50 mg total) by mouth 3 (three) times daily.   inFLIXimab 100 MG injection Commonly known as: REMICADE Inject into the vein every 3 (three) months.   insulin lispro 100 UNIT/ML  injection Commonly known as: HumaLOG Use max of 140 units daily via insulin pump.  metoprolol tartrate 50 MG tablet Commonly known as: LOPRESSOR Take 1 tablet (50 mg total) by mouth 2 (two) times daily.   nitroGLYCERIN 0.4 MG SL tablet Commonly known as: NITROSTAT Place 1 tablet (0.4 mg total) under the tongue every 5 (five) minutes as needed for chest pain.   omeprazole 20 MG capsule Commonly known as: PRILOSEC   OSTEO BI-FLEX ONE PER DAY PO Take by mouth.   OSTEO BI-FLEX REGULAR STRENGTH PO Take 1 tablet by mouth daily after supper.   phenazopyridine 200 MG tablet Commonly known as: Pyridium Take 1 tablet (200 mg total) by mouth 3 (three) times daily as needed for pain.   sertraline 25 MG tablet Commonly known as: ZOLOFT Take 12.5 mg by mouth daily.   sildenafil 20 MG tablet Commonly known as: REVATIO Take 20 mg by mouth 3 (three) times daily.   sulfaSALAzine 500 MG tablet Commonly known as: AZULFIDINE   tamsulosin 0.4 MG Caps capsule Commonly known as: FLOMAX Take 1 capsule (0.4 mg total) by mouth daily.   TART CHERRY ADVANCED PO Take by mouth.   traMADol 50 MG tablet Commonly known as: Ultram Take 1-2 tablets (50-100 mg total) by mouth every 6 (six) hours as needed for moderate pain.   traZODone 50 MG tablet Commonly known as: DESYREL TAKE 1 TABLET BY MOUTH AT BEDTIME AS NEEDED 90   VITAMIN D3 PO Take by mouth daily.   zolpidem 5 MG tablet Commonly known as: AMBIEN Take 5 mg by mouth at bedtime.       Allergies:  Allergies  Allergen Reactions  . Nsaids Other (See Comments)    Past Medical History:  Diagnosis Date  . Anxiety   . Arthritis   . Bilateral lower extremity edema   . Chronic gout    06-19-2020  per pt last episode 6 months , great toe  . CKD (chronic kidney disease), stage III ([place])    followed by pcp  . Coronary artery disease cardiologist--- dr hochrein   a. nuclear study 04-25-2017 low risk b. ETT abnormal 07-07-2018  c.   cardiac cath 07-15-2018  severe multivessel disease   d.  CABG x3  . Degenerative arthritis of shoulder region 08/2013   left  . DOE (dyspnea on exertion)   . History of bladder cancer 07/2008   s/p  TURBT  . History of cellulitis 11/2017   left upper arm  . History of kidney stones   . Hyperlipidemia   . Hypertension    followed by pcp/ cardiology  . IDDM (insulin dependent diabetes mellitus)    endocrinologist--- dr Dwyane Dee---  pt uses insulin pump and dexcom  (06-19-2020 per pt fasting sugar-- 98--110)  . Insulin pump in place   . OSA (obstructive sleep apnea)   . Prostate cancer [facility]) urologist--- dr herrick/  oncologist--- [place]   dx 11/ 2017  Gleason 3+3 active survillance;  bx 03-14-2020 Gleason 3+4  . RA (rheumatoid arthritis) ([place])    rhemotologist--- dr Normajean Glasgow---    . S/P CABG x 3 07/15/2018   LIMA to LAD;  SVG to PDA;  SVG to RI    Past Surgical History:  Procedure Laterality Date  . CHOLECYSTECTOMY  08/29/2012   Procedure: LAPAROSCOPIC CHOLECYSTECTOMY WITH INTRAOPERATIVE CHOLANGIOGRAM;  Surgeon: Imogene Burn. Georgette Dover, MD;  Location: [facility];  Service: General;  Laterality: N/A;  . CORONARY ARTERY BYPASS GRAFT N/A 07/20/2018   Procedure: CORONARY ARTERY BYPASS GRAFTING (CABG) times three using left internal mammary artery to the LAD, and  using endoscopically harvested right saphenous vein to PDA and intermedius.;  Surgeon: Grace Isaac, MD;  Location: [place];  Service: Open Heart Surgery;  Laterality: N/A;  . LEFT HEART CATH AND CORONARY ANGIOGRAPHY N/A 07/15/2018   Procedure: LEFT HEART CATH AND CORONARY ANGIOGRAPHY;  Surgeon: Leonie Man, MD;  Location: [place] [facility];  Service: Cardiovascular;  Laterality: N/A;  . RADIOACTIVE SEED IMPLANT N/A 06/21/2020   Procedure: RADIOACTIVE SEED IMPLANT/BRACHYTHERAPY IMPLANT;  Surgeon: Ardis Hughs, MD;  Location: [facility];  Service: Urology;  Laterality: N/A;  . SHOULDER ARTHROSCOPY W/ ROTATOR  CUFF REPAIR Right 09/03/2013  . SHOULDER ARTHROSCOPY WITH SUBACROMIAL DECOMPRESSION, ROTATOR CUFF REPAIR AND BICEP TENDON REPAIR Left 09/03/2013   Procedure: LEFT SHOULDER ARTHROSCOPY WITH EXTENSIVE DEBRIDMENT, DISTAL CLAVICULECTOMY, ROTATOR CUFF REPAIR AND SUBACROMIAL DECOMPRESSION PARTIAL ACRIOMIOPLASTY WITH CORACOACROMIAL RELEASE;  Surgeon: Renette Butters, MD;  Location: [place];  Service: Orthopedics;  Laterality: Left;  . SPACE OAR INSTILLATION N/A 06/21/2020   Procedure: SPACE OAR INSTILLATION;  Surgeon: Ardis Hughs, MD;  Location: [facility];  Service: Urology;  Laterality: N/A;  . TEE WITHOUT CARDIOVERSION N/A 07/20/2018   Procedure: TRANSESOPHAGEAL ECHOCARDIOGRAM (TEE);  Surgeon: Grace Isaac, MD;  Location: [place];  Service: Open Heart Surgery;  Laterality: N/A;  . TRANSURETHRAL RESECTION OF BLADDER TUMOR WITH MITOMYCIN-C  08/24/2008   '@[facility]'$     Family History  Problem Relation Age of Onset  . Diabetes Mother   . Lung cancer Mother   . Cancer Father   . Coronary artery disease Father        MI at age 60.   . Lung cancer Father   . Sudden death Son   . CAD Maternal Grandfather        MI in his 48's  . CAD Paternal Grandfather   . Breast cancer Neg Hx   . Colon cancer Neg Hx   . Pancreatic cancer Neg Hx     Social History:  reports that he quit smoking about 23 years ago. His smoking use included cigarettes. He has a 50.00 pack-year smoking history. He has never used smokeless tobacco. He reports previous alcohol use. He reports that he does not use drugs.    Review of Systems      HYPERTENSION: Blood pressure also followed by cardiologist and nephrologist He also checks at home: 130 or less/80  He is not on ACE or ARB medication  BP Readings from Last 3 Encounters:  09/14/20 (!) 142/80  07/12/20 140/65  06/21/20 (!) 147/61           Lipids: he is taking Lipitor 40 mg from his PCP LDL in 11/2019 was 41      Lab  Results  Component Value Date   CHOL 104 06/16/2019   HDL 70 06/16/2019   LDLCALC 49 06/16/2019   TRIG 106 06/16/2019   CHOLHDL 3.8 07/16/2018               CKD: He has been followed by nephrologist for diabetic nephropathy.  Recently no changes made Last urine microalbumin ratio is much higher, previously had come back to normal with lisinopril and not clear why he is not taking this  His creatinine is variable but overall stable   Lab Results  Component Value Date   CREATININE 1.99 (H) 09/11/2020   CREATININE 1.92 (H) 06/19/2020   CREATININE 1.82 (H) 05/22/2020   Lab Results  Component Value Date  K 3.8 09/11/2020    Last foot exam in 08/2019  Has neuropathic symptoms controlled with low-dose gabapentin, also on Requip from PCP   Eye exam last:  10/20   Physical Examination:  BP (!) 142/80   Pulse 63   Ht $R'5\' 7"'OO$  (1.702 m)   Wt 206 lb 3.2 oz (93.5 kg)   SpO2 95%   BMI 32.30 kg/m       ASSESSMENT:  Diabetes type 2, insulin requiring  See history of present illness for detailed discussion of his current management, blood sugar patterns and problems identified  His A1c is 5.9, previously 6.1  He is on the T-insulin pump with the Dexcom sensor  Blood sugars appear to be relatively higher in the last 2 weeks as discussed above but difficult to identify consistent pattern He may have some variability in his diet, activity level as well as occasional infusion site problems Not clear if he has benefited from consultation with dietitian still sometimes having balanced meals in the morning  Usually fairly consistent with doing boluses for meals and snacks  Renal dysfunction: Stable  Blood pressure is variably controlled, followed by nephrologist    PLAN: He will need to make correction boluses when blood sugars are usually high To call if he has persistently high readings in the evenings again Rotate infusion sites better He will try to bolus  consistently sometime before eating Also continue to try and adjust mealtime boluses based on meal size and carbohydrate intake Stay on Victoza and consider 1.8 mg for weight benefits also  Turn on exercise more when planning to be very active   Follow-up in 3 months   Patient Instructions  Use exercise mode when active        Elayne Snare 09/14/2020, 12:57 PM   Note: This office note was prepared with Dragon voice recognition system technology. Any transcriptional errors that result from this process are unintentional.

## 2020-10-03 ENCOUNTER — Telehealth: Payer: Self-pay | Admitting: Cardiology

## 2020-10-03 NOTE — Telephone Encounter (Signed)
     [facility] calling to follow up request

## 2020-10-03 NOTE — Telephone Encounter (Signed)
*  STAT* If patient is at the pharmacy, call can be transferred to refill team.   1. Which medications need to be refilled? (please list name of each medication and dose if known) hydrALAZINE (APRESOLINE) 50 MG tablet(Expired)  2. Which pharmacy/location (including street and city if local pharmacy) is medication to be sent to? [facility] [address], [place], [place] [address]  3. Do they need a 30 day or 90 day supply? 90 day supply   Patient is completely out of medication.

## 2020-10-04 MED ORDER — HYDRALAZINE HCL 50 MG PO TABS: 50.0000 mg | ORAL_TABLET | Freq: Three times a day (TID) | ORAL | 1 refills | Status: DC

## 2020-10-04 NOTE — Telephone Encounter (Signed)
Refill has been sent in for the patient. He has been made aware.

## 2020-10-04 NOTE — Telephone Encounter (Signed)
Patient is calling back due to this prescription still not being sent to the pharmacy. He is completely out of the medication. Please advise.

## 2020-10-12 DIAGNOSIS — C61 Malignant neoplasm of prostate: Secondary | ICD-10-CM | POA: Diagnosis not present

## 2020-10-17 DIAGNOSIS — N183 Chronic kidney disease, stage 3 unspecified: Secondary | ICD-10-CM | POA: Diagnosis not present

## 2020-10-23 DIAGNOSIS — N2581 Secondary hyperparathyroidism of renal origin: Secondary | ICD-10-CM | POA: Diagnosis not present

## 2020-10-23 DIAGNOSIS — D631 Anemia in chronic kidney disease: Secondary | ICD-10-CM | POA: Diagnosis not present

## 2020-10-23 DIAGNOSIS — N1832 Chronic kidney disease, stage 3b: Secondary | ICD-10-CM | POA: Diagnosis not present

## 2020-10-23 DIAGNOSIS — I129 Hypertensive chronic kidney disease with stage 1 through stage 4 chronic kidney disease, or unspecified chronic kidney disease: Secondary | ICD-10-CM | POA: Diagnosis not present

## 2020-10-25 ENCOUNTER — Other Ambulatory Visit: Payer: Self-pay | Admitting: Cardiology

## 2020-10-27 DIAGNOSIS — Z8546 Personal history of malignant neoplasm of prostate: Secondary | ICD-10-CM | POA: Diagnosis not present

## 2020-10-27 DIAGNOSIS — N5201 Erectile dysfunction due to arterial insufficiency: Secondary | ICD-10-CM | POA: Diagnosis not present

## 2020-10-27 DIAGNOSIS — R351 Nocturia: Secondary | ICD-10-CM | POA: Diagnosis not present

## 2020-10-27 DIAGNOSIS — Z8551 Personal history of malignant neoplasm of bladder: Secondary | ICD-10-CM | POA: Diagnosis not present

## 2020-10-27 NOTE — Progress Notes (Signed)
Cardiology Office Note   Date:  11/03/2020   ID:  Alec [name], DOB 01/25/1946, MRN [id number]  PCP:  Shaw, Kimberlee, MD  Cardiologist:  Hochrein  Lightheadedness   History of Present Illness: Alec [name] a 74 y.o. male who presents for ongoing assessment and management of coronary artery disease.  He [name] status post CABG X 3 in 2019. (LIMA to LAD, Right SVG to PDA and intermedius). Last seen by Dr. Hochrein on 10/28/2019 and [name] here for annual follow-up.  Other history includes anxiety, chronic kidney disease stage III, hyperlipidemia, and hypertension along with insulin-dependent diabetes. Called our office on 10/04/2020 for prescription refills.  These were provided however he needed to have a follow-up appointment.  Most recent labs drawn 09/11/2020 Creatinine 1.99.GFR 33.01.   He has also been seeing a Rheumatologist and Urologist since last visit. He has had Flomax 0.04 mg added to his regimen. He also has an insulin pump.   He complains of lightheadedness in the afternoon, going on for the last 6 months. Seems to be getting worse as time progresses. He did not feel save to drive yesterday, and when laid down and sleep in the afternoon. He denies syncope or near syncope. Mild position change lightheadedness on occasion,.  He denies chest pain, significant dyspnea, or fatigue.   Past Medical History:  Diagnosis Date  . Anxiety   . Arthritis   . Bilateral lower extremity edema   . Chronic gout    06-19-2020  per pt last episode 6 months , great toe  . CKD (chronic kidney disease), stage III (HCC)    followed by pcp  . Coronary artery disease cardiologist--- dr hochrein   a. nuclear study 04-25-2017 low risk b. ETT abnormal 07-07-2018  c.  cardiac cath 07-15-2018  severe multivessel disease   d.  CABG x3  . Degenerative arthritis of shoulder region 08/2013   left  . DOE (dyspnea on exertion)   . History of bladder cancer 07/2008   s/p  TURBT  . History of cellulitis 11/2017    left upper arm  . History of kidney stones   . Hyperlipidemia   . Hypertension    followed by pcp/ cardiology  . IDDM (insulin dependent diabetes mellitus)    endocrinologist--- dr kumar---  pt uses insulin pump and dexcom  (06-19-2020 per pt fasting sugar-- 98--110)  . Insulin pump in place   . OSA (obstructive sleep apnea)   . Prostate cancer (HCC) urologist--- dr herrick/  oncologist--- manning   dx 11/ 2017  Gleason 3+3 active survillance;  bx 03-14-2020 Gleason 3+4  . RA (rheumatoid arthritis) (HCC)    rhemotologist--- dr a.[email or web address]---    . S/P CABG x 3 07/15/2018   LIMA to LAD;  SVG to PDA;  SVG to RI    Past Surgical History:  Procedure Laterality Date  . CHOLECYSTECTOMY  08/29/2012   Procedure: LAPAROSCOPIC CHOLECYSTECTOMY WITH INTRAOPERATIVE CHOLANGIOGRAM;  Surgeon: Matthew K. Tsuei, MD;  Location: [facility];  Service: General;  Laterality: N/A;  . CORONARY ARTERY BYPASS GRAFT N/A 07/20/2018   Procedure: CORONARY ARTERY BYPASS GRAFTING (CABG) times three using left internal mammary artery to the LAD, and using endoscopically harvested right saphenous vein to PDA and intermedius.;  Surgeon: Gerhardt, Edward B, MD;  Location: [facility];  Service: Open Heart Surgery;  Laterality: N/A;  . LEFT HEART CATH AND CORONARY ANGIOGRAPHY N/A 07/15/2018   Procedure: LEFT HEART CATH AND CORONARY ANGIOGRAPHY;  Surgeon: Harding, David W,   MD;  Location: [facility];  Service: Cardiovascular;  Laterality: N/A;  . RADIOACTIVE SEED IMPLANT N/A 06/21/2020   Procedure: RADIOACTIVE SEED IMPLANT/BRACHYTHERAPY IMPLANT;  Surgeon: Herrick, Benjamin W, MD;  Location: [facility];  Service: Urology;  Laterality: N/A;  . SHOULDER ARTHROSCOPY W/ ROTATOR CUFF REPAIR Right 09/03/2013  . SHOULDER ARTHROSCOPY WITH SUBACROMIAL DECOMPRESSION, ROTATOR CUFF REPAIR AND BICEP TENDON REPAIR Left 09/03/2013   Procedure: LEFT SHOULDER ARTHROSCOPY WITH EXTENSIVE DEBRIDMENT, DISTAL CLAVICULECTOMY, ROTATOR  CUFF REPAIR AND SUBACROMIAL DECOMPRESSION PARTIAL ACRIOMIOPLASTY WITH CORACOACROMIAL RELEASE;  Surgeon: Timothy D Murphy, MD;  Location: [facility];  Service: Orthopedics;  Laterality: Left;  . SPACE OAR INSTILLATION N/A 06/21/2020   Procedure: SPACE OAR INSTILLATION;  Surgeon: Herrick, Benjamin W, MD;  Location: [facility];  Service: Urology;  Laterality: N/A;  . TEE WITHOUT CARDIOVERSION N/A 07/20/2018   Procedure: TRANSESOPHAGEAL ECHOCARDIOGRAM (TEE);  Surgeon: Gerhardt, Edward B, MD;  Location: [facility];  Service: Open Heart Surgery;  Laterality: N/A;  . TRANSURETHRAL RESECTION OF BLADDER TUMOR WITH MITOMYCIN-C  08/24/2008   @[facility]     Current Outpatient Medications  Medication Sig Dispense Refill  . allopurinol (ZYLOPRIM) 100 MG tablet Take 200 mg by mouth daily.     . aspirin EC 81 MG tablet Take 81 mg by mouth daily.    . Blood Glucose Monitoring Suppl (FREESTYLE FREEDOM LITE) w/Device KIT Use to check blood sugar three times daily. DX:E11.65 1 kit 0  . [place]-Glucosamine-Vit D (OSTEO BI-FLEX ONE PER DAY PO) Take by mouth.    . Cholecalciferol (VITAMIN D3 PO) Take by mouth daily.     . CINNAMON PO Take by mouth.    . cloNIDine (CATAPRES) 0.1 MG tablet Take 1 tablet (0.1 mg total) by mouth 2 (two) times daily. (Patient taking differently: Take 0.1 mg by mouth 2 (two) times daily.) 60 tablet 10  . folic acid (FOLVITE) 1 MG tablet Take 1 tablet (1 mg total) by mouth daily. 90 tablet 3  . furosemide (LASIX) 20 MG tablet TAKE 1 TABLET BY MOUTH ONCE DAILY AS NEEDED FOR EDEMA 30 tablet 1  . Glucosamine-Chondroitin (OSTEO BI-FLEX REGULAR STRENGTH PO) Take 1 tablet by mouth daily after supper.    . glucose blood (FREESTYLE LITE) test strip USE AS INSTRUCTED TO TEST BLOOD SUGARS 3 TIMES DAILY. DX CODE-E11.65 (PAY FOR 100 IN 90 DAYS) 100 strip 3  . hydrALAZINE (APRESOLINE) 50 MG tablet Take 50 mg by mouth in the morning, 25 mg at noon and 50 mg at bedtime    . insulin  lispro (HUMALOG) 100 UNIT/ML injection Use max of 140 units daily via insulin pump. 130 mL 1  . Misc Natural Products (TART CHERRY ADVANCED PO) Take by mouth.    . Multiple Vitamins-Minerals (CENTRUM SILVER PO) Take by mouth daily.     . omeprazole (PRILOSEC) 20 MG capsule     . sertraline (ZOLOFT) 25 MG tablet Take 12.5 mg by mouth daily.     . sildenafil (REVATIO) 20 MG tablet Take 20 mg by mouth 3 (three) times daily.    . tamsulosin (FLOMAX) 0.4 MG CAPS capsule Take 1 capsule (0.4 mg total) by mouth daily. 30 capsule 5  . traZODone (DESYREL) 50 MG tablet TAKE 1 TABLET BY MOUTH AT BEDTIME AS NEEDED 90    . zolpidem (AMBIEN) 5 MG tablet Take 5 mg by mouth at bedtime.    . amLODipine (NORVASC) 10 MG tablet Take 1 tablet (10 mg total)   by mouth daily. 90 tablet 3  . atorvastatin (LIPITOR) 40 MG tablet Take 1 tablet (40 mg total) by mouth daily. 90 tablet 3  . metoprolol tartrate (LOPRESSOR) 50 MG tablet Take 1 tablet (50 mg total) by mouth 2 (two) times daily. 180 tablet 3  . nitroGLYCERIN (NITROSTAT) 0.4 MG SL tablet Place 1 tablet (0.4 mg total) under the tongue every 5 (five) minutes as needed for chest pain. 30 tablet 1   No current facility-administered medications for this visit.    Allergies:   Nsaids    Social History:  The patient  reports that he quit smoking about 23 years ago. His smoking use included cigarettes. He has a 50.00 pack-year smoking history. He has never used smokeless tobacco. He reports previous alcohol use. He reports that he does not use drugs.   Family History:  The patient's family history includes CAD in his maternal grandfather and paternal grandfather; Cancer in his father; Coronary artery disease in his father; Diabetes in his mother; Lung cancer in his father and mother; Sudden death in his son.    ROS: All other systems are reviewed and negative. Unless otherwise mentioned in H&P    PHYSICAL EXAM: VS:  BP 130/68   Pulse (!) 56   Ht 5' 6" (1.676 m)    Wt 205 lb (93 kg)   SpO2 95%   BMI 33.09 kg/m  , BMI Body mass index [name] 33.09 kg/m. GEN: Well nourished, well developed, in no acute distress HEENT: normal Neck: no JVD, carotid bruits, or masses Cardiac: RRR no MRG, slightly bradycardic,  rubs, or gallops,no edema  Respiratory:  Clear to auscultation bilaterally, normal work of breathing GI: soft, nontender, nondistended, + BS MS: no deformity or atrophy Skin: warm and dry, no rash Neuro:  Strength and sensation are intact Psych: euthymic mood, full affect   EKG:  Not completed this office visit.  Recent Labs: 06/19/2020: ALT 30; Hemoglobin 13.6; Platelets 148 09/11/2020: BUN 21; Creatinine, Ser 1.99; Potassium 3.8; Sodium 139    Lipid Panel    Component Value Date/Time   CHOL 104 06/16/2019 0000   TRIG 106 06/16/2019 0000   HDL 70 06/16/2019 0000   CHOLHDL 3.8 07/16/2018 0731   VLDL 24 07/16/2018 0731   LDLCALC 49 06/16/2019 0000      Wt Readings from Last 3 Encounters:  11/03/20 205 lb (93 kg)  09/14/20 206 lb 3.2 oz (93.5 kg)  07/20/20 197 lb (89.4 kg)      Other studies Reviewed: Left Heart Cath 07/15/2018  LV Findings:  The left ventricular systolic function [name] low-normal. The left ventricular ejection fraction [name] 50-55% by visual estimate.  LV end diastolic pressure [name] moderately elevated.  Angiographic Findings:  Mid RCA-1 lesion [name] 40% stenosed. Mid RCA-2 lesion [name] 95% stenosed. Dist RCA lesion [name] 95% stenosed just at the takeoff of the RPAV.  Mid LM to Prox LAD lesion [name] 50% stenosed. -Extensive long vessel that [name] calcified. Very small circumflex and ramus.  Prox LAD lesion [name] 90% stenosed at takeoff of 1st Diag. Prox LAD to Mid LAD lesion [name] 80% stenosed.   Severe multivessel disease with distal left main calcified 40 to 50% stenosis, proximal LAD tandem 90 and 80% lesions covering along area and including major first diagonal branch.  This [name] in conjunction with 2 significant mid and distal RCA  lesions of 95% and then a 90% bifurcation lesion just prior to the takeoff of the PAB. Likely low normal   EF but difficult to assess wall motion because of ectopy.  Recommend 2D echo prior to discharge. Moderate elevated LVEDP.  Based on the patient's anatomy, I think he [name] best served for CABG.  We will consult CT surgery. Given his reduced renal function, we will admit him for overnight observation for IV fluid hydration.    ASSESSMENT AND PLAN:  1.  Lightheadedness: He [name] on a lot of medications for hypertension, which may be playing into his symptoms. Polypharmacy [name] also a concern  Due to his afternoon dizziness, I will decrease his hydralazine 50 mg at noontime to 25 mg at noontime. Hopefully symptoms will be improved. If not may need to consider eliminating the middle afternoon dose.  He [name] to take his BP in the afternoon and record this for a week.  This will give [place] an indication of his response to the medication adjustment.   2. CAD: CABG 2019. He [name] without chest pain or DOE. He remains on BB,Statin and ASA. No planned testing at this time.   3. Mixed hyperlipidemia: I am checking fasting lipids this morning. He [name] on atorvastatin with goal of LDL <70. This has not been check since 01/2020.  4. Hypertension: On 4 medications for control.  Concerned about cumulative affect as he takes them all at once with the exception of the afternoon hydralazine and the BID doses. May need to consider pharmacist consult for polypharmacy contributing to his symptoms as well.    5. BPH: Recently started pm Flomax 0.4 mg daily by urology. He also has seed placement. The dizziness was occurring before the Flomax.    Current medicines are reviewed at length with the patient today.  I have spent 25 minutes dedicated to the care of this patient on the date of this encounter to include pre-visit review of records, assessment, management and diagnostic testing,with shared decision making.  Labs/ tests  ordered today include: Lipid panel.   Phill Myron. [place], ANP, [facility]   11/03/2020 8:38 AM    [facility] [place] [address] Office [phone] Fax [phone]  Notice: This dictation was prepared with Dragon dictation along with smaller phrase technology. Any transcriptional errors that result from this process are unintentional and may not be corrected upon review.

## 2020-10-30 ENCOUNTER — Other Ambulatory Visit: Payer: Self-pay | Admitting: Cardiology

## 2020-11-02 DIAGNOSIS — M1A00X0 Idiopathic chronic gout, unspecified site, without tophus (tophi): Secondary | ICD-10-CM | POA: Diagnosis not present

## 2020-11-02 DIAGNOSIS — M0609 Rheumatoid arthritis without rheumatoid factor, multiple sites: Secondary | ICD-10-CM | POA: Diagnosis not present

## 2020-11-02 DIAGNOSIS — Z79899 Other long term (current) drug therapy: Secondary | ICD-10-CM | POA: Diagnosis not present

## 2020-11-03 ENCOUNTER — Ambulatory Visit (INDEPENDENT_AMBULATORY_CARE_PROVIDER_SITE_OTHER): Payer: Medicare Other | Admitting: Adult Health

## 2020-11-03 ENCOUNTER — Encounter: Payer: Self-pay | Admitting: Adult Health

## 2020-11-03 ENCOUNTER — Other Ambulatory Visit: Payer: Self-pay

## 2020-11-03 VITALS — BP 130/68 | HR 56 | Ht 66.0 in | Wt 205.0 lb

## 2020-11-03 DIAGNOSIS — Z951 Presence of aortocoronary bypass graft: Secondary | ICD-10-CM

## 2020-11-03 DIAGNOSIS — R42 Dizziness and giddiness: Secondary | ICD-10-CM

## 2020-11-03 DIAGNOSIS — M0609 Rheumatoid arthritis without rheumatoid factor, multiple sites: Secondary | ICD-10-CM

## 2020-11-03 DIAGNOSIS — I10 Essential (primary) hypertension: Secondary | ICD-10-CM

## 2020-11-03 DIAGNOSIS — I2510 Atherosclerotic heart disease of native coronary artery without angina pectoris: Secondary | ICD-10-CM

## 2020-11-03 DIAGNOSIS — E785 Hyperlipidemia, unspecified: Secondary | ICD-10-CM | POA: Diagnosis not present

## 2020-11-03 LAB — LIPID PANEL
Chol/HDL Ratio: 3.5 ratio (ref 0.0–5.0)
Cholesterol, Total: 116 mg/dL (ref 100–199)
HDL: 33 mg/dL — ABNORMAL LOW (ref >39)
LDL Chol Calc (NIH): 63 mg/dL (ref 0–99)
Triglycerides: 109 mg/dL (ref 0–149)
VLDL Cholesterol Cal: 20 mg/dL (ref 5–40)

## 2020-11-03 MED ORDER — AMLODIPINE BESYLATE 10 MG PO TABS: 10.0000 mg | ORAL_TABLET | Freq: Every day | ORAL | 3 refills | Status: DC

## 2020-11-03 MED ORDER — METOPROLOL TARTRATE 50 MG PO TABS: 50.0000 mg | ORAL_TABLET | Freq: Two times a day (BID) | ORAL | 3 refills | Status: DC

## 2020-11-03 MED ORDER — ATORVASTATIN CALCIUM 40 MG PO TABS: 40.0000 mg | ORAL_TABLET | Freq: Every day | ORAL | 3 refills | Status: DC

## 2020-11-03 NOTE — Patient Instructions (Signed)
Medication Instructions:  DECREASE- Hydralazine 50 mg by mouth in the morning 25 mg at noon and 50 at bedtime  *If you need a refill on your cardiac medications before your next appointment, please call your pharmacy*   Lab Work: Fasting Lipids today  If you have labs (blood work) drawn today and your tests are completely normal, you will receive your results only by: Marland Kitchen MyChart Message (if you have MyChart) OR . A paper copy in the mail If you have any lab test that is abnormal or we need to change your treatment, we will call you to review the results.   Testing/Procedures: None Ordered   Follow-Up: At [facility], you and your health needs are our priority.  As part of our continuing mission to provide you with exceptional heart care, we have created designated Provider Care Teams.  These Care Teams include your primary Cardiologist (physician) and Advanced Practice Providers (APPs -  Physician Assistants and Nurse Practitioners) who all work together to provide you with the care you need, when you need it.  We recommend signing up for the patient portal called "MyChart".  Sign up information is provided on this After Visit Summary.  MyChart is used to connect with patients for Virtual Visits (Telemedicine).  Patients are able to view lab/test results, encounter notes, upcoming appointments, etc.  Non-urgent messages can be sent to your provider as well.   To learn more about what you can do with MyChart, go to [email or web address].    Your next appointment:   3 month(s)  The format for your next appointment:   In Person  Provider:   You may see Minus Breeding, MD or one of the following Advanced Practice Providers on your designated Care Team:    Rosaria Ferries, PA-C  Jory Sims, DNP, ANP

## 2020-11-27 ENCOUNTER — Telehealth: Payer: Self-pay | Admitting: Nutrition

## 2020-11-27 NOTE — Telephone Encounter (Signed)
Message left on my machine that they are sending him a new pump today, hopefully before noon, and needed help with setting this up.  Told him I had time a 11:30, if he got it before then.  If not, I can do it tomorrow at 8:45AM.

## 2020-11-28 ENCOUNTER — Other Ambulatory Visit: Payer: Self-pay

## 2020-11-28 ENCOUNTER — Encounter: Payer: Medicare Other | Attending: Endocrinology | Admitting: Nutrition

## 2020-11-28 DIAGNOSIS — E1165 Type 2 diabetes mellitus with hyperglycemia: Secondary | ICD-10-CM | POA: Diagnosis not present

## 2020-11-29 NOTE — Progress Notes (Signed)
Alec [name] is here today because the screen go cracked on his Tandem pump, and he needed assistance transferring settings and linking CGM and t-connect to his new pump. Settings were transferred and Dexcom was linked to pump and T-Connect. He had no final questions.

## 2020-12-17 ENCOUNTER — Other Ambulatory Visit: Payer: Self-pay | Admitting: Cardiology

## 2020-12-18 ENCOUNTER — Other Ambulatory Visit: Payer: Self-pay | Admitting: Cardiology

## 2020-12-20 DIAGNOSIS — J309 Allergic rhinitis, unspecified: Secondary | ICD-10-CM | POA: Diagnosis not present

## 2020-12-20 DIAGNOSIS — G4700 Insomnia, unspecified: Secondary | ICD-10-CM | POA: Diagnosis not present

## 2020-12-20 DIAGNOSIS — H401130 Primary open-angle glaucoma, bilateral, stage unspecified: Secondary | ICD-10-CM | POA: Diagnosis not present

## 2020-12-20 DIAGNOSIS — I129 Hypertensive chronic kidney disease with stage 1 through stage 4 chronic kidney disease, or unspecified chronic kidney disease: Secondary | ICD-10-CM | POA: Diagnosis not present

## 2020-12-20 DIAGNOSIS — M069 Rheumatoid arthritis, unspecified: Secondary | ICD-10-CM | POA: Diagnosis not present

## 2020-12-20 DIAGNOSIS — J339 Nasal polyp, unspecified: Secondary | ICD-10-CM | POA: Diagnosis not present

## 2021-01-09 ENCOUNTER — Other Ambulatory Visit (INDEPENDENT_AMBULATORY_CARE_PROVIDER_SITE_OTHER): Payer: Medicare Other

## 2021-01-09 ENCOUNTER — Other Ambulatory Visit: Payer: Self-pay

## 2021-01-09 DIAGNOSIS — E1165 Type 2 diabetes mellitus with hyperglycemia: Secondary | ICD-10-CM | POA: Diagnosis not present

## 2021-01-09 DIAGNOSIS — Z794 Long term (current) use of insulin: Secondary | ICD-10-CM | POA: Diagnosis not present

## 2021-01-09 LAB — HEMOGLOBIN A1C: Hgb A1c MFr Bld: 6 % (ref 4.6–6.5)

## 2021-01-09 LAB — GLUCOSE, RANDOM: Glucose, Bld: 103 mg/dL — ABNORMAL HIGH (ref 70–99)

## 2021-01-11 ENCOUNTER — Other Ambulatory Visit: Payer: Medicare Other

## 2021-01-16 DIAGNOSIS — E113293 Type 2 diabetes mellitus with mild nonproliferative diabetic retinopathy without macular edema, bilateral: Secondary | ICD-10-CM | POA: Diagnosis not present

## 2021-01-16 DIAGNOSIS — H401131 Primary open-angle glaucoma, bilateral, mild stage: Secondary | ICD-10-CM | POA: Diagnosis not present

## 2021-01-16 DIAGNOSIS — H25813 Combined forms of age-related cataract, bilateral: Secondary | ICD-10-CM | POA: Diagnosis not present

## 2021-01-18 ENCOUNTER — Other Ambulatory Visit: Payer: Self-pay | Admitting: *Deleted

## 2021-01-18 ENCOUNTER — Encounter: Payer: Self-pay | Admitting: Endocrinology

## 2021-01-18 ENCOUNTER — Telehealth: Payer: Self-pay | Admitting: Endocrinology

## 2021-01-18 ENCOUNTER — Other Ambulatory Visit: Payer: Self-pay

## 2021-01-18 ENCOUNTER — Ambulatory Visit (INDEPENDENT_AMBULATORY_CARE_PROVIDER_SITE_OTHER): Payer: Medicare Other | Admitting: Endocrinology

## 2021-01-18 ENCOUNTER — Other Ambulatory Visit: Payer: Self-pay | Admitting: Cardiology

## 2021-01-18 VITALS — BP 156/66 | HR 64 | Ht 67.0 in | Wt 201.8 lb

## 2021-01-18 DIAGNOSIS — Z794 Long term (current) use of insulin: Secondary | ICD-10-CM | POA: Diagnosis not present

## 2021-01-18 DIAGNOSIS — I10 Essential (primary) hypertension: Secondary | ICD-10-CM

## 2021-01-18 DIAGNOSIS — E1165 Type 2 diabetes mellitus with hyperglycemia: Secondary | ICD-10-CM

## 2021-01-18 DIAGNOSIS — I2510 Atherosclerotic heart disease of native coronary artery without angina pectoris: Secondary | ICD-10-CM | POA: Diagnosis not present

## 2021-01-18 DIAGNOSIS — E1121 Type 2 diabetes mellitus with diabetic nephropathy: Secondary | ICD-10-CM | POA: Diagnosis not present

## 2021-01-18 MED ORDER — INSULIN ASPART 100 UNIT/ML ~~LOC~~ SOLN: SUBCUTANEOUS | 1 refills | Status: DC

## 2021-01-18 MED ORDER — INSULIN LISPRO 100 UNIT/ML ~~LOC~~ SOLN: SUBCUTANEOUS | 1 refills | Status: DC

## 2021-01-18 MED ORDER — DAPAGLIFLOZIN PROPANEDIOL 5 MG PO TABS: 5.0000 mg | ORAL_TABLET | Freq: Every day | ORAL | 3 refills | Status: DC

## 2021-01-18 NOTE — Telephone Encounter (Signed)
Message left for patient to return call.

## 2021-01-18 NOTE — Telephone Encounter (Signed)
I spoke with him to let him know that I changed his humalog to novolog per patients insurance. Patient stated that he tried to get Farxiga filled but it was $543.  He asked for a cheaper alternative and I told him to call his insurance company to find out what is covered and to call me back so we can get it sent in.

## 2021-01-18 NOTE — Progress Notes (Signed)
Patient ID: Alec [name], male   DOB: 02-11-1946, 75 y.o.   MRN: [id number]             Reason for Appointment:  Follow-up for Type 2 Diabetes    History of Present Illness:          Diagnosis: Type 2 diabetes mellitus, date of diagnosis: 2001       Prior history: He was seen in consultation in 11/2014 when his A1c was 7.4 Since his blood sugars were averaging over 200 he was given Victoza in addition to his basal bolus insulin regimen in 6/16 He started on the V go pump in October 2016  Recent history:    INSULIN regimen  on T-Slim pump:    BASAL rate settings:  Midnight = 2.5, 3 AM = 2.2, 8 AM = 2.4, 4 PM = 2.7, 7 PM = 2.8 and 10 PM = 2.4  Boluses 12- 16 units at breakfast, 12 units lunch and about 18 at dinner  total daily insulin dose up to 126 units  Non-insulin hypoglycemic drugs the patient is taking are: None   His A1c is lower than expected for his sugars but stable at 6% compared to 5.9   Current blood sugar patterns from analysis of his Dexcom sensor download, daily management and problems identified:    He is doing very well with his blood sugar management with fairly good blood sugars and time in target is slightly better  May be occasionally bolusing after starting to eat as seen on his blood sugar profile from the Dexcom  He thinks he is not missing any boluses  Not clear why his blood sugars tend to be higher late in the evening, may not be always covering snacks  His activity level is only modest and does not have any low sugars related to physical activity  Insulin requirement is about the same with 39% of the insulin and boluses only  Usually entering about 8 to 12 units to cover her meals  Is still uncomfortable making programming changes in the pump  He was on Victoza but he stopped this on his own without letting us know because of the high cost  Glucose monitoring:  Dexcom  Blood sugar readings are on average within the target range  except higher from 7-11 PM  Blood sugar variability is mostly occurring late morning and in the evenings after 5 PM  OVERNIGHT blood sugars start off higher with some variability but usually are excellent until about 9 AM  No hypoglycemia seen with only rare low normal readings around 10:30 AM  POSTPRANDIAL readings appear to be generally well controlled without excessive rise or fall after the bolus  Statistics:    CGM use % of time  91  2-week average/SD  150  Time in range  75     %  % Time Above 180  25  % Time above 250   % Time Below 70 0      PRE-MEAL  overnight  mornings  afternoon  evening Overall  Glucose range:       Averages:  138  130  146  185  150      Previous data:   CGM use % of time  95  2-week average/SD  158+/-42  Time in range       73%  % Time Above 180  27  % Time above 250   % Time Below 70 0  PRE-MEAL  overnight  mornings  afternoon  evening Overall  Glucose range:      68-316  Averages:  134  138  151  182+/-48  158 +/-42    Self-care:  Meals: 3 meals per day. Lunch  1 pm Dinner 6 pm Breakfast is usually an egg sandwich but sometimes biscuits   Eating balanced meals in the evening.  He will have snacks with granola bars              Dietician visit, most recent: 06/2020.               Weight history:  Wt Readings from Last 3 Encounters:  01/18/21 201 lb 12.8 oz (91.5 kg)  11/03/20 205 lb (93 kg)  09/14/20 206 lb 3.2 oz (93.5 kg)    Glycemic control:   Lab Results  Component Value Date   HGBA1C 6.0 01/09/2021   HGBA1C 5.9 09/11/2020   HGBA1C 6.1 05/22/2020   Lab Results  Component Value Date   MICROALBUR 191.5 (H) 02/18/2020   LDLCALC 63 11/03/2020   CREATININE 1.99 (H) 09/11/2020     Past history:  He was not symptomatic at time of diagnosis and was probably treated with Amaryl only.  No details are available of previous management However he thinks he was not given metformin at any time because of his  "kidney problem" He had a A1c of over 9% in 2013 and may have been started on insulin at that time. Most likely has been taking Levemir and NovoLog since then A1c records show his results were over 8% in 2014 and in 4/15 but subsequently down to 7.1 in November 2015 Amaryl was stopped in 7/16  Other active problems are addressed in Review of systems   Allergies as of 01/18/2021      Reactions   Nsaids Other (See Comments)      Medication List       Accurate as of January 18, 2021  9:07 AM. If you have any questions, ask your nurse or doctor.        allopurinol 100 MG tablet Commonly known as: ZYLOPRIM Take 200 mg by mouth daily.   amLODipine 10 MG tablet Commonly known as: NORVASC Take 1 tablet (10 mg total) by mouth daily.   aspirin EC 81 MG tablet Take 81 mg by mouth daily.   atorvastatin 40 MG tablet Commonly known as: LIPITOR Take 1 tablet (40 mg total) by mouth daily.   CENTRUM SILVER PO Take by mouth daily.   CINNAMON PO Take by mouth.   cloNIDine 0.1 MG tablet Commonly known as: CATAPRES Take 1 tablet (0.1 mg total) by mouth 2 (two) times daily.   folic acid 1 MG tablet Commonly known as: FOLVITE Take 1 tablet (1 mg total) by mouth daily.   FreeStyle Freedom Lite w/Device Kit Use to check blood sugar three times daily. DX:E11.65   FREESTYLE LITE test strip Generic drug: glucose blood USE AS INSTRUCTED TO TEST BLOOD SUGARS 3 TIMES DAILY. DX CODE-E11.65 (PAY FOR 100 IN 90 DAYS)   furosemide 20 MG tablet Commonly known as: LASIX TAKE 1 TABLET BY MOUTH ONCE DAILY AS NEEDED FOR EDEMA   hydrALAZINE 50 MG tablet Commonly known as: APRESOLINE Take 50 mg by mouth in the morning, 25 mg at noon and 50 mg at bedtime   insulin lispro 100 UNIT/ML injection Commonly known as: HumaLOG Use max of 140 units daily via insulin pump.   metoprolol tartrate 50 MG  tablet Commonly known as: LOPRESSOR TAKE 1 TABLET BY MOUTH TWICE DAILY   nitroGLYCERIN 0.4 MG SL  tablet Commonly known as: NITROSTAT Place 1 tablet (0.4 mg total) under the tongue every 5 (five) minutes as needed for chest pain.   omeprazole 20 MG capsule Commonly known as: PRILOSEC   OSTEO BI-FLEX ONE PER DAY PO Take by mouth.   OSTEO BI-FLEX REGULAR STRENGTH PO Take 1 tablet by mouth daily after supper.   sertraline 25 MG tablet Commonly known as: ZOLOFT Take 25 mg by mouth daily. Takes 1 tablet twice a day   sildenafil 20 MG tablet Commonly known as: REVATIO Take 20 mg by mouth 3 (three) times daily.   tamsulosin 0.4 MG Caps capsule Commonly known as: FLOMAX Take 1 capsule (0.4 mg total) by mouth daily.   TART CHERRY ADVANCED PO Take by mouth.   traZODone 50 MG tablet Commonly known as: DESYREL TAKE 1 TABLET BY MOUTH AT BEDTIME AS NEEDED 90   VITAMIN D3 PO Take by mouth daily.   zolpidem 5 MG tablet Commonly known as: AMBIEN Take 5 mg by mouth at bedtime.       Allergies:  Allergies  Allergen Reactions  . Nsaids Other (See Comments)    Past Medical History:  Diagnosis Date  . Anxiety   . Arthritis   . Bilateral lower extremity edema   . Chronic gout    06-19-2020  per pt last episode 6 months , great toe  . CKD (chronic kidney disease), stage III ([place])    followed by pcp  . Coronary artery disease cardiologist--- dr hochrein   a. nuclear study 04-25-2017 low risk b. ETT abnormal 07-07-2018  c.  cardiac cath 07-15-2018  severe multivessel disease   d.  CABG x3  . Degenerative arthritis of shoulder region 08/2013   left  . DOE (dyspnea on exertion)   . History of bladder cancer 07/2008   s/p  TURBT  . History of cellulitis 11/2017   left upper arm  . History of kidney stones   . Hyperlipidemia   . Hypertension    followed by pcp/ cardiology  . IDDM (insulin dependent diabetes mellitus)    endocrinologist--- dr Dwyane Dee---  pt uses insulin pump and dexcom  (06-19-2020 per pt fasting sugar-- 98--110)  . Insulin pump in place   . OSA  (obstructive sleep apnea)   . Prostate cancer [facility]) urologist--- dr herrick/  oncologist--- [place]   dx 11/ 2017  Gleason 3+3 active survillance;  bx 03-14-2020 Gleason 3+4  . RA (rheumatoid arthritis) ([place])    rhemotologist--- dr Normajean [place]---    . S/P CABG x 3 07/15/2018   LIMA to LAD;  SVG to PDA;  SVG to RI    Past Surgical History:  Procedure Laterality Date  . CHOLECYSTECTOMY  08/29/2012   Procedure: LAPAROSCOPIC CHOLECYSTECTOMY WITH INTRAOPERATIVE CHOLANGIOGRAM;  Surgeon: Imogene Burn. Tsuei, MD;  Location: [facility];  Service: General;  Laterality: N/A;  . CORONARY ARTERY BYPASS GRAFT N/A 07/20/2018   Procedure: CORONARY ARTERY BYPASS GRAFTING (CABG) times three using left internal mammary artery to the LAD, and using endoscopically harvested right saphenous vein to PDA and intermedius.;  Surgeon: Grace Isaac, MD;  Location: [place];  Service: Open Heart Surgery;  Laterality: N/A;  . LEFT HEART CATH AND CORONARY ANGIOGRAPHY N/A 07/15/2018   Procedure: LEFT HEART CATH AND CORONARY ANGIOGRAPHY;  Surgeon: Leonie Man, MD;  Location: [facility];  Service: Cardiovascular;  Laterality: N/A;  .  RADIOACTIVE SEED IMPLANT N/A 06/21/2020   Procedure: RADIOACTIVE SEED IMPLANT/BRACHYTHERAPY IMPLANT;  Surgeon: Ardis Hughs, MD;  Location: [facility];  Service: Urology;  Laterality: N/A;  . SHOULDER ARTHROSCOPY W/ ROTATOR CUFF REPAIR Right 09/03/2013  . SHOULDER ARTHROSCOPY WITH SUBACROMIAL DECOMPRESSION, ROTATOR CUFF REPAIR AND BICEP TENDON REPAIR Left 09/03/2013   Procedure: LEFT SHOULDER ARTHROSCOPY WITH EXTENSIVE DEBRIDMENT, DISTAL CLAVICULECTOMY, ROTATOR CUFF REPAIR AND SUBACROMIAL DECOMPRESSION PARTIAL ACRIOMIOPLASTY WITH CORACOACROMIAL RELEASE;  Surgeon: Renette Butters, MD;  Location: [place];  Service: Orthopedics;  Laterality: Left;  . SPACE OAR INSTILLATION N/A 06/21/2020   Procedure: SPACE OAR INSTILLATION;  Surgeon: Ardis Hughs, MD;  Location: [facility];  Service: Urology;  Laterality: N/A;  . TEE WITHOUT CARDIOVERSION N/A 07/20/2018   Procedure: TRANSESOPHAGEAL ECHOCARDIOGRAM (TEE);  Surgeon: Grace Isaac, MD;  Location: [place];  Service: Open Heart Surgery;  Laterality: N/A;  . TRANSURETHRAL RESECTION OF BLADDER TUMOR WITH MITOMYCIN-C  08/24/2008   '@[facility]'$     Family History  Problem Relation Age of Onset  . Diabetes Mother   . Lung cancer Mother   . Cancer Father   . Coronary artery disease Father        MI at age 72.   . Lung cancer Father   . Sudden death Son   . CAD Maternal Grandfather        MI in his 28's  . CAD Paternal Grandfather   . Breast cancer Neg Hx   . Colon cancer Neg Hx   . Pancreatic cancer Neg Hx     Social History:  reports that he quit smoking about 23 years ago. His smoking use included cigarettes. He has a 50.00 pack-year smoking history. He has never used smokeless tobacco. He reports previous alcohol use. He reports that he does not use drugs.    Review of Systems      HYPERTENSION: Blood pressure also followed by cardiologist and nephrologist He also checks at home: 120-150/80  He is not on ACE or ARB medication  BP Readings from Last 3 Encounters:  01/18/21 (!) 156/66  11/03/20 130/68  09/14/20 (!) 142/80           Lipids: he is taking Lipitor 40 mg from his PCP LDL in 11/2019 was 41      Lab Results  Component Value Date   CHOL 116 11/03/2020   HDL 33 (L) 11/03/2020   LDLCALC 63 11/03/2020   TRIG 109 11/03/2020   CHOLHDL 3.5 11/03/2020               CKD: He has been followed by nephrologist for diabetic nephropathy.   Last urine microalbumin ratio is about 200 He is not on an ACE inhibitor  His creatinine is variable but overall stable, most recently about 1.7   Lab Results  Component Value Date   CREATININE 1.99 (H) 09/11/2020   CREATININE 1.92 (H) 06/19/2020   CREATININE 1.82 (H) 05/22/2020   Lab Results  Component  Value Date   K 3.8 09/11/2020    Last foot exam in 08/2019  Has neuropathic symptoms controlled with low-dose gabapentin, also on Requip from PCP   Eye exam last:  10/20   Physical Examination:  BP (!) 156/66   Pulse 64   Ht $R'5\' 7"'Nj$  (1.702 m)   Wt 201 lb 12.8 oz (91.5 kg)   SpO2 98%   BMI 31.61 kg/m       ASSESSMENT:  Diabetes  type 2, insulin requiring  See history of present illness for detailed discussion of his current management, blood sugar patterns and problems identified  His A1c is lower than expected for his blood sugar average of 115 and is now 6%  He is on the T-insulin pump with the Dexcom sensor  He has reasonably good control and has benefited from the insulin pump with better A1c Most of his high sugars are in the evenings but not always related to food intake or inadequate boluses Boluses may be sometimes late when he starts eating Currently not taking Victoza but not gaining weight, he says he cannot afford this Able to manage his pump on his own fairly well but not able to program it  Renal dysfunction: Overall stable but relatively worse compared to 2 years ago  Blood pressure is variably controlled, followed by nephrologist and other physicians    PLAN: Basal rate from 7 PM-10 PM will be 3.0, higher  He will try to bolus consistently sometime before eating Trial of FARXIGA 5 mg daily for multiple benefits especially renal protection since newer drugs are not covered by insurance Discussed action of SGLT 2 drugs on lowering glucose by decreasing kidney absorption of glucose, benefits of weight loss, renal and cardiac protection and possibly lower blood pressure, possible side effects including candidiasis and dosage regimen  He will continue to monitor blood pressure regularly at home and notify us if he has any issues Likely will not need any Lasix now which he takes as needed only  Regular walking for exercise   Follow-up in 3 months    There are no Patient Instructions on file for this visit.       Elayne Snare 01/18/2021, 9:07 AM   Note: This office note was prepared with Dragon voice recognition system technology. Any transcriptional errors that result from this process are unintentional.

## 2021-01-18 NOTE — Telephone Encounter (Signed)
New message    Patient was seen today having problems with getting his DM prescription .    Asking for a call back from the nurse.

## 2021-01-18 NOTE — Telephone Encounter (Signed)
F/u   Okay to talk with wife , returning call back to the nurse from today

## 2021-01-31 ENCOUNTER — Ambulatory Visit: Payer: Medicare Other | Admitting: Cardiology

## 2021-02-13 NOTE — Progress Notes (Signed)
Cardiology Office Note   Date:  02/14/2021   ID:  Alec [name], DOB [birth year], MRN [id number]  PCP:  Mayra Neer, MD  Cardiologist:   Minus Breeding, MD   Chief Complaint  Patient presents with  . Coronary Artery Disease      History of Present Illness: Alec [name] is a 75 y.o. male who presents for follow up of CAD status post CABG.  At the last visit with our DNP he had lightheadedness and he had his hydralazine reduced. He, however did not have any improvement with this.  Subsequently his Zoloft was increased and he felt better.    Since I last saw him he has done well.  He is doing some golfing.  He denies any shortness of breath, PND or orthopnea.  He has had no palpitations.  He denies any chest pressure, neck or arm discomfort.  He has had some episodes of some dizziness and presyncope but it really seems to happen when his sugar is low.   Past Medical History:  Diagnosis Date  . Anxiety   . Arthritis   . Bilateral lower extremity edema   . Chronic gout    06-19-2020  per pt last episode 6 months , great toe  . CKD (chronic kidney disease), stage III ([place])    followed by pcp  . Coronary artery disease cardiologist--- dr Kendal Raffo   a. nuclear study 04-25-2017 low risk b. ETT abnormal 07-07-2018  c.  cardiac cath 07-15-2018  severe multivessel disease   d.  CABG x3  . Degenerative arthritis of shoulder region 08/2013   left  . DOE (dyspnea on exertion)   . History of bladder cancer 07/2008   s/p  TURBT  . History of cellulitis 11/2017   left upper arm  . History of kidney stones   . Hyperlipidemia   . Hypertension    followed by pcp/ cardiology  . IDDM (insulin dependent diabetes mellitus)    endocrinologist--- dr Dwyane Dee---  pt uses insulin pump and dexcom  (06-19-2020 per pt fasting sugar-- 98--110)  . Insulin pump in place   . OSA (obstructive sleep apnea)   . Prostate cancer [facility]) urologist--- dr herrick/  oncologist--- [place]   dx 11/ 2017   Gleason 3+3 active survillance;  bx 03-14-2020 Gleason 3+4  . RA (rheumatoid arthritis) ([place])    rhemotologist--- dr Normajean Glasgow---    . S/P CABG x 3 07/15/2018   LIMA to LAD;  SVG to PDA;  SVG to RI    Past Surgical History:  Procedure Laterality Date  . CHOLECYSTECTOMY  08/29/2012   Procedure: LAPAROSCOPIC CHOLECYSTECTOMY WITH INTRAOPERATIVE CHOLANGIOGRAM;  Surgeon: Imogene Burn. Tsuei, MD;  Location: [facility];  Service: General;  Laterality: N/A;  . CORONARY ARTERY BYPASS GRAFT N/A 07/20/2018   Procedure: CORONARY ARTERY BYPASS GRAFTING (CABG) times three using left internal mammary artery to the LAD, and using endoscopically harvested right saphenous vein to PDA and intermedius.;  Surgeon: Grace Isaac, MD;  Location: [facility];  Service: Open Heart Surgery;  Laterality: N/A;  . LEFT HEART CATH AND CORONARY ANGIOGRAPHY N/A 07/15/2018   Procedure: LEFT HEART CATH AND CORONARY ANGIOGRAPHY;  Surgeon: Leonie Man, MD;  Location: [place];  Service: Cardiovascular;  Laterality: N/A;  . RADIOACTIVE SEED IMPLANT N/A 06/21/2020   Procedure: RADIOACTIVE SEED IMPLANT/BRACHYTHERAPY IMPLANT;  Surgeon: Ardis Hughs, MD;  Location: [facility];  Service: Urology;  Laterality: N/A;  . SHOULDER ARTHROSCOPY  W/ ROTATOR CUFF REPAIR Right 09/03/2013  . SHOULDER ARTHROSCOPY WITH SUBACROMIAL DECOMPRESSION, ROTATOR CUFF REPAIR AND BICEP TENDON REPAIR Left 09/03/2013   Procedure: LEFT SHOULDER ARTHROSCOPY WITH EXTENSIVE DEBRIDMENT, DISTAL CLAVICULECTOMY, ROTATOR CUFF REPAIR AND SUBACROMIAL DECOMPRESSION PARTIAL ACRIOMIOPLASTY WITH CORACOACROMIAL RELEASE;  Surgeon: Renette Butters, MD;  Location: [facility];  Service: Orthopedics;  Laterality: Left;  . SPACE OAR INSTILLATION N/A 06/21/2020   Procedure: SPACE OAR INSTILLATION;  Surgeon: Ardis Hughs, MD;  Location: [facility];  Service: Urology;  Laterality: N/A;  . TEE WITHOUT CARDIOVERSION N/A  07/20/2018   Procedure: TRANSESOPHAGEAL ECHOCARDIOGRAM (TEE);  Surgeon: Grace Isaac, MD;  Location: [facility];  Service: Open Heart Surgery;  Laterality: N/A;  . TRANSURETHRAL RESECTION OF BLADDER TUMOR WITH MITOMYCIN-C  08/24/2008   _0      Current Outpatient Medications  Medication Sig Dispense Refill  . allopurinol (ZYLOPRIM) 100 MG tablet Take 200 mg by mouth daily.     Marland Kitchen amLODipine (NORVASC) 10 MG tablet Take 1 tablet (10 mg total) by mouth daily. 90 tablet 3  . aspirin EC 81 MG tablet Take 81 mg by mouth daily.    Marland Kitchen atorvastatin (LIPITOR) 40 MG tablet Take 1 tablet (40 mg total) by mouth daily. 90 tablet 3  . Blood Glucose Monitoring Suppl (FREESTYLE FREEDOM LITE) w/Device KIT Use to check blood sugar three times daily. DX:E11.65 1 kit 0  . [place]-Glucosamine-Vit D (OSTEO BI-FLEX ONE PER DAY PO) Take by mouth.    . Cholecalciferol (VITAMIN D3 PO) Take by mouth daily.     Marland Kitchen CINNAMON PO Take by mouth.    . cloNIDine (CATAPRES) 0.1 MG tablet Take 1 tablet (0.1 mg total) by mouth 2 (two) times daily. (Patient taking differently: Take 0.1 mg by mouth 2 (two) times daily.) 60 tablet 10  . dapagliflozin propanediol (FARXIGA) 5 MG TABS tablet Take 1 tablet (5 mg total) by mouth daily. 30 tablet 3  . folic acid (FOLVITE) 1 MG tablet Take 1 tablet (1 mg total) by mouth daily. 90 tablet 3  . furosemide (LASIX) 20 MG tablet TAKE 1 TABLET BY MOUTH ONCE DAILY AS NEEDED FOR EDEMA 30 tablet 1  . Glucosamine-Chondroitin (OSTEO BI-FLEX REGULAR STRENGTH PO) Take 1 tablet by mouth daily after supper.    Marland Kitchen glucose blood (FREESTYLE LITE) test strip USE AS INSTRUCTED TO TEST BLOOD SUGARS 3 TIMES DAILY. DX CODE-E11.65 (PAY FOR 100 IN 90 DAYS) 100 strip 3  . hydrALAZINE (APRESOLINE) 50 MG tablet Take 50 mg by mouth in the morning, 25 mg at noon and 50 mg at bedtime    . insulin aspart (NOVOLOG) 100 UNIT/ML injection Use max of 140 units per day in Insulin pump.  (to replace Humalog 50 mL 1  . insulin  lispro (HUMALOG) 100 UNIT/ML injection Use max of 140 units daily via insulin pump. 130 mL 1  . methotrexate (RHEUMATREX) 2.5 MG tablet Take by mouth.    . metoprolol tartrate (LOPRESSOR) 50 MG tablet TAKE 1 TABLET BY MOUTH TWICE DAILY 90 tablet 3  . Misc Natural Products (TART CHERRY ADVANCED PO) Take by mouth.    . Multiple Vitamins-Minerals (CENTRUM SILVER PO) Take by mouth daily.     Marland Kitchen omeprazole (PRILOSEC) 20 MG capsule     . sertraline (ZOLOFT) 25 MG tablet Take 25 mg by mouth daily. Takes 1 tablet twice a day    . sildenafil (REVATIO) 20 MG tablet Take 20 mg by mouth 3 (three) times daily.    Marland Kitchen  tamsulosin (FLOMAX) 0.4 MG CAPS capsule Take 1 capsule (0.4 mg total) by mouth daily. 30 capsule 5  . traZODone (DESYREL) 50 MG tablet TAKE 1 TABLET BY MOUTH AT BEDTIME AS NEEDED 90    . zolpidem (AMBIEN) 5 MG tablet Take 5 mg by mouth at bedtime.    . nitroGLYCERIN (NITROSTAT) 0.4 MG SL tablet Place 1 tablet (0.4 mg total) under the tongue every 5 (five) minutes as needed for chest pain. 30 tablet 1   No current facility-administered medications for this visit.    Allergies:   Nsaids    ROS:  Please see the history of present illness.   Otherwise, review of systems are positive for none.   All other systems are reviewed and negative.    PHYSICAL EXAM: VS:  BP 130/60   Pulse (!) 45   Ht $R'5\' 6"'Ed$  (1.676 m)   Wt 196 lb 9.6 oz (89.2 kg)   SpO2 95%   BMI 31.73 kg/m  , BMI Body mass index is 31.73 kg/m. GENERAL:  Well appearing NECK:  No jugular venous distention, waveform within normal limits, carotid upstroke brisk and symmetric, no bruits, no thyromegaly LUNGS:  Clear to auscultation bilaterally CHEST:  Well healed sternotomy scar. HEART:  PMI not displaced or sustained,S1 and S2 within normal limits, no S3, no S4, no clicks, no rubs, no murmurs ABD:  Flat, positive bowel sounds normal in frequency in pitch, no bruits, no rebound, no guarding, no midline pulsatile mass, no hepatomegaly,  no splenomegaly EXT:  2 plus pulses throughout, trace edema, no cyanosis no clubbing  EKG:  EKG is  ordered today. Sinus bradycardia, rate 45, axis within normal limits, intervals within normal limits, no acute ST-T wave changes.  Recent Labs: 06/19/2020: ALT 30; Hemoglobin 13.6; Platelets 148 09/11/2020: BUN 21; Creatinine, Ser 1.99; Potassium 3.8; Sodium 139    Lipid Panel    Component Value Date/Time   CHOL 116 11/03/2020 0908   TRIG 109 11/03/2020 0908   HDL 33 (L) 11/03/2020 0908   CHOLHDL 3.5 11/03/2020 0908   CHOLHDL 3.8 07/16/2018 0731   VLDL 24 07/16/2018 0731   LDLCALC 63 11/03/2020 0908      Wt Readings from Last 3 Encounters:  02/14/21 196 lb 9.6 oz (89.2 kg)  01/18/21 201 lb 12.8 oz (91.5 kg)  11/03/20 205 lb (93 kg)      Other studies Reviewed: Additional studies/ records that were reviewed today include: Labs Review of the above records demonstrates:  Please see elsewhere in the note.     ASSESSMENT AND PLAN:  LEG SWELLING:     This is trace now and improved from previous.  No change in therapy.    BRADYCARDIA:   We talked a bit about this and he has been having tachypalpitations in the past despite his bradycardia and he seems to tolerate his bradycardia so I am going to leave the current dose of beta-blocker.  He has dizziness he has infrequently seems to be related to his blood sugars.  He would let me know if something changes.    CAD:    The patient has no new sypmtoms.  No further cardiovascular testing is indicated.  We will continue with aggressive risk reduction and meds as listed.  HTN:  The blood pressure is at target.  No change in therapy.   HYPERLIPIDEMIA:   LDL was 63 with an HDL of 33.  No change in therapy.    SLEEP APNEA:   He ultimately  was told that he did not have sleep apnea with the final study.  I tried to review these results and could not see the actual results and he is no longer on CPAP.   DM:   A1C was 6.0.  However, Dr.  Dwyane Dee wanted to start Farxiga 5 mg and the patient could not afford this.  This would be good from a nephrology and heart standpoint so we are going to try to help him with patient assistance and a referral will be made in the patient given paperwork.   CKD III:   Creat is 1.69.  No change in therapy.  He is followed by nephrology.   Current medicines are reviewed at length with the patient today.  The patient does not have concerns regarding medicines.  The following changes have been made:  None  Labs/ tests ordered today include:   None  Orders Placed This Encounter  Procedures  . EKG 12-Lead     Disposition:   FU with me in one year.    Signed, Minus Breeding, MD  02/14/2021 9:21 AM    [facility]

## 2021-02-14 ENCOUNTER — Telehealth: Payer: Self-pay | Admitting: *Deleted

## 2021-02-14 ENCOUNTER — Other Ambulatory Visit: Payer: Self-pay

## 2021-02-14 ENCOUNTER — Encounter: Payer: Self-pay | Admitting: Cardiology

## 2021-02-14 ENCOUNTER — Ambulatory Visit (INDEPENDENT_AMBULATORY_CARE_PROVIDER_SITE_OTHER): Payer: Medicare Other | Admitting: Cardiology

## 2021-02-14 VITALS — BP 130/60 | HR 45 | Ht 66.0 in | Wt 196.6 lb

## 2021-02-14 DIAGNOSIS — I10 Essential (primary) hypertension: Secondary | ICD-10-CM

## 2021-02-14 DIAGNOSIS — I2510 Atherosclerotic heart disease of native coronary artery without angina pectoris: Secondary | ICD-10-CM | POA: Diagnosis not present

## 2021-02-14 NOTE — Patient Instructions (Signed)
Medication Instructions:  Your physician recommends that you continue on your current medications as directed. Please refer to the Current Medication list given to you today.   *If you need a refill on your cardiac medications before your next appointment, please call your pharmacy*  Lab Work: NONE   Testing/Procedures: NONE   Follow-Up: At [facility], you and your health needs are our priority.  As part of our continuing mission to provide you with exceptional heart care, we have created designated Provider Care Teams.  These Care Teams include your primary Cardiologist (physician) and Advanced Practice Providers (APPs -  Physician Assistants and Nurse Practitioners) who all work together to provide you with the care you need, when you need it.  We recommend signing up for the patient portal called "MyChart".  Sign up information is provided on this After Visit Summary.  MyChart is used to connect with patients for Virtual Visits (Telemedicine).  Patients are able to view lab/test results, encounter notes, upcoming appointments, etc.  Non-urgent messages can be sent to your provider as well.   To learn more about what you can do with MyChart, go to [email or web address].    Your next appointment:   12 month(s)  The format for your next appointment:   In Person  Provider:   You may see Minus Breeding, MD or one of the following Advanced Practice Providers on your designated Care Team:    Rosaria Ferries, PA-C  Jory Sims, DNP, ANP  Other Instructions  SOMEONE FROM PHARM D OR SOCIAL WORK WILL BE [place]

## 2021-02-14 NOTE — Telephone Encounter (Signed)
-----   Message from [place], [place] sent at 02/14/2021  9:30 AM EDT ----- The only two things a patient has to do on the Farxiga app is to sign and provide their income and how many people are in there house. Was there something else he needed guidance on?  Westley Hummer, MSW, [place]  [phone]   ----- Message ----- From: Earvin [name], LPN Sent: [id number]/34/9179   9:24 AM EDT To: Rockne Menghini, RPH-CPP, #  GM ALL This patient needs patient assistance with [place]. Per Dr Percival Spanish he received message from [place] that PA for this should be deferred to you guys, sorry. I did give him the papers at visit. Dr Percival Spanish said he would take of prescribing even though Dr Dwyane Dee Rx's originally  Thanks Rip Harbour

## 2021-02-14 NOTE — Telephone Encounter (Signed)
Left message to call back to go over patient assistance forms with patient

## 2021-02-21 ENCOUNTER — Other Ambulatory Visit: Payer: Self-pay | Admitting: Cardiology

## 2021-02-28 ENCOUNTER — Other Ambulatory Visit: Payer: Self-pay | Admitting: Cardiology

## 2021-02-28 MED ORDER — CLONIDINE HCL 0.1 MG PO TABS: 0.1000 mg | ORAL_TABLET | Freq: Two times a day (BID) | ORAL | 11 refills | Status: DC

## 2021-02-28 NOTE — Telephone Encounter (Signed)
Clonidine refilled.

## 2021-03-05 DIAGNOSIS — H401131 Primary open-angle glaucoma, bilateral, mild stage: Secondary | ICD-10-CM | POA: Diagnosis not present

## 2021-04-09 DIAGNOSIS — Z8546 Personal history of malignant neoplasm of prostate: Secondary | ICD-10-CM | POA: Diagnosis not present

## 2021-04-16 DIAGNOSIS — Z8546 Personal history of malignant neoplasm of prostate: Secondary | ICD-10-CM | POA: Diagnosis not present

## 2021-04-16 DIAGNOSIS — Z8551 Personal history of malignant neoplasm of bladder: Secondary | ICD-10-CM | POA: Diagnosis not present

## 2021-04-16 DIAGNOSIS — N5201 Erectile dysfunction due to arterial insufficiency: Secondary | ICD-10-CM | POA: Diagnosis not present

## 2021-04-24 ENCOUNTER — Other Ambulatory Visit: Payer: Medicare Other

## 2021-04-26 ENCOUNTER — Ambulatory Visit: Payer: Medicare Other | Admitting: Endocrinology

## 2021-05-07 DIAGNOSIS — U071 COVID-19: Secondary | ICD-10-CM | POA: Diagnosis not present

## 2021-05-07 DIAGNOSIS — Z20822 Contact with and (suspected) exposure to covid-19: Secondary | ICD-10-CM | POA: Diagnosis not present

## 2021-05-07 DIAGNOSIS — I10 Essential (primary) hypertension: Secondary | ICD-10-CM | POA: Diagnosis not present

## 2021-05-18 ENCOUNTER — Encounter (HOSPITAL_COMMUNITY): Payer: Self-pay

## 2021-05-18 ENCOUNTER — Other Ambulatory Visit: Payer: Self-pay

## 2021-05-18 ENCOUNTER — Ambulatory Visit (HOSPITAL_COMMUNITY)
Admission: EM | Admit: 2021-05-18 | Discharge: 2021-05-18 | Disposition: A | Discharge status: Home / Self Care | Payer: Medicare Other | Attending: Emergency Medicine | Admitting: Emergency Medicine

## 2021-05-18 DIAGNOSIS — M62838 Other muscle spasm: Secondary | ICD-10-CM

## 2021-05-18 DIAGNOSIS — S46912A Strain of unspecified muscle, fascia and tendon at shoulder and upper arm level, left arm, initial encounter: Secondary | ICD-10-CM

## 2021-05-18 MED ORDER — TIZANIDINE HCL 4 MG PO TABS: 4.0000 mg | ORAL_TABLET | Freq: Four times a day (QID) | ORAL | 0 refills | Status: DC | PRN

## 2021-05-18 MED ORDER — ACETAMINOPHEN 500 MG PO TABS: 500.0000 mg | ORAL_TABLET | Freq: Four times a day (QID) | ORAL | 0 refills | Status: DC | PRN

## 2021-05-18 NOTE — Discharge Instructions (Addendum)
Take the Tylenol every 6 hours as needed for pain. You can take tizanidine every 6 hours as needed for muscle pain and spasms.  Do not take this medication prior to driving as it can make you sleepy.   Rest as much as possible.  You can use heat, ice, or alternate between heat and ice for pain relief.  You can use lidocaine patches, Icy/Hot, biofreeze, or Voltaren gel as needed for comfort.   Follow up with sports medicine or orthopedics if symptoms do not improve in the next few days.

## 2021-05-18 NOTE — ED Provider Notes (Signed)
[place]    CSN: [id number] Arrival date & time: 05/18/21  1136      History   Chief Complaint Chief Complaint  Patient presents with   Shoulder Pain    HPI Alec [name] is a 75 y.o. male.   Patient here for evaluation of left shoulder and neck pain that started several days ago.  Reports pain has gotten progressively worse.  Reports pain is worse with movement.  Has not taken any OTC medication or treatment.  Reports history of bilateral shoulder surgery.  Denies any trauma, injury, or other precipitating event.  Denies any fevers, chest pain, shortness of breath, N/V/D, numbness, tingling, weakness, abdominal pain, or headaches.     The history is provided by the patient.  Shoulder Pain  Past Medical History:  Diagnosis Date   Anxiety    Arthritis    Bilateral lower extremity edema    Chronic gout    06-19-2020  per pt last episode 6 months , great toe   CKD (chronic kidney disease), stage III ([place])    followed by pcp   Coronary artery disease cardiologist--- dr hochrein   a. nuclear study 04-25-2017 low risk b. ETT abnormal 07-07-2018  c.  cardiac cath 07-15-2018  severe multivessel disease   d.  CABG x3   Degenerative arthritis of shoulder region 08/2013   left   DOE (dyspnea on exertion)    History of bladder cancer 07/2008   s/p  TURBT   History of cellulitis 11/2017   left upper arm   History of kidney stones    Hyperlipidemia    Hypertension    followed by pcp/ cardiology   IDDM (insulin dependent diabetes mellitus)    endocrinologist--- dr Dwyane Dee---  pt uses insulin pump and dexcom  (06-19-2020 per pt fasting sugar-- 98--110)   Insulin pump in place    OSA (obstructive sleep apnea)    Prostate cancer [facility]) urologist--- dr herrick/  oncologist--- [place]   dx 11/ 2017  Gleason 3+3 active survillance;  bx 03-14-2020 Gleason 3+4   RA (rheumatoid arthritis) ([place])    rhemotologist--- dr Normajean Glasgow---     S/P CABG x 3 07/15/2018   LIMA to  LAD;  SVG to PDA;  SVG to RI    Patient Active Problem List   Diagnosis Date Noted   Snoring 07/18/2020   Excessive sleepiness 07/18/2020   Witnessed episode of apnea 07/18/2020   Leg swelling 04/30/2020   Bradycardia 04/30/2020   Malignant neoplasm of prostate ([place]) 04/18/2020   Educated about COVID-19 virus infection 10/26/2019   Rheumatoid arthritis of multiple sites with negative rheumatoid factor ([place]) 09/06/2019   SOB (shortness of breath) 11/10/2018   Cough 11/10/2018   Wheezing 11/10/2018   Coronary artery disease involving native coronary artery of native heart without angina pectoris 11/10/2018   Dyslipidemia 11/10/2018   CRI (chronic renal insufficiency), stage 3 (moderate) ([place]) 08/06/2018   S/P CABG x 3 07/20/2018   Angina, class II ([place]) 07/14/2018   Abnormal stress electrocardiography using treadmill 07/14/2018   Cellulitis of left arm 12/10/2017   Cellulitis 12/09/2017   Diabetic nephropathy associated with type 2 diabetes mellitus ([place]) 12/14/2014   Essential hypertension 12/14/2014   IDDM (insulin dependent diabetes mellitus) 08/29/2012   Dyslipidemia, goal LDL below 70 08/29/2012   Abdominal pain 08/29/2012    Past Surgical History:  Procedure Laterality Date   CHOLECYSTECTOMY  08/29/2012   Procedure: LAPAROSCOPIC CHOLECYSTECTOMY WITH INTRAOPERATIVE CHOLANGIOGRAM;  Surgeon: Imogene Burn. Tsuei,  MD;  Location: [facility];  Service: General;  Laterality: N/A;   CORONARY ARTERY BYPASS GRAFT N/A 07/20/2018   Procedure: CORONARY ARTERY BYPASS GRAFTING (CABG) times three using left internal mammary artery to the LAD, and using endoscopically harvested right saphenous vein to PDA and intermedius.;  Surgeon: Grace Isaac, MD;  Location: [place];  Service: Open Heart Surgery;  Laterality: N/A;   LEFT HEART CATH AND CORONARY ANGIOGRAPHY N/A 07/15/2018   Procedure: LEFT HEART CATH AND CORONARY ANGIOGRAPHY;  Surgeon: Leonie Man, MD;  Location: [facility];   Service: Cardiovascular;  Laterality: N/A;   RADIOACTIVE SEED IMPLANT N/A 06/21/2020   Procedure: RADIOACTIVE SEED IMPLANT/BRACHYTHERAPY IMPLANT;  Surgeon: Ardis Hughs, MD;  Location: [facility];  Service: Urology;  Laterality: N/A;   SHOULDER ARTHROSCOPY W/ ROTATOR CUFF REPAIR Right 09/03/2013   SHOULDER ARTHROSCOPY WITH SUBACROMIAL DECOMPRESSION, ROTATOR CUFF REPAIR AND BICEP TENDON REPAIR Left 09/03/2013   Procedure: LEFT SHOULDER ARTHROSCOPY WITH EXTENSIVE DEBRIDMENT, DISTAL CLAVICULECTOMY, ROTATOR CUFF REPAIR AND SUBACROMIAL DECOMPRESSION PARTIAL ACRIOMIOPLASTY WITH CORACOACROMIAL RELEASE;  Surgeon: Renette Butters, MD;  Location: [place];  Service: Orthopedics;  Laterality: Left;   SPACE OAR INSTILLATION N/A 06/21/2020   Procedure: SPACE OAR INSTILLATION;  Surgeon: Ardis Hughs, MD;  Location: [facility];  Service: Urology;  Laterality: N/A;   TEE WITHOUT CARDIOVERSION N/A 07/20/2018   Procedure: TRANSESOPHAGEAL ECHOCARDIOGRAM (TEE);  Surgeon: Grace Isaac, MD;  Location: [place];  Service: Open Heart Surgery;  Laterality: N/A;   TRANSURETHRAL RESECTION OF BLADDER TUMOR WITH MITOMYCIN-C  08/24/2008   '@[facility]'$        Home Medications    Prior to Admission medications   Medication Sig Start Date End Date Taking? Authorizing Provider  acetaminophen (TYLENOL) 500 MG tablet Take 1 tablet (500 mg total) by mouth every 6 (six) hours as needed. 05/18/21  Yes Pearson Forster, NP  tiZANidine (ZANAFLEX) 4 MG tablet Take 1 tablet (4 mg total) by mouth every 6 (six) hours as needed for muscle spasms. 05/18/21  Yes Pearson Forster, NP  allopurinol (ZYLOPRIM) 100 MG tablet Take 200 mg by mouth daily.     [provider]  amLODipine (NORVASC) 10 MG tablet Take 1 tablet (10 mg total) by mouth daily. 11/03/20   Lendon Colonel, NP  aspirin EC 81 MG tablet Take 81 mg by mouth daily.    [provider]  atorvastatin (LIPITOR)  40 MG tablet Take 1 tablet (40 mg total) by mouth daily. 11/03/20   Lendon Colonel, NP  Blood Glucose Monitoring Suppl (FREESTYLE FREEDOM LITE) w/Device KIT Use to check blood sugar three times daily. DX:E11.65 02/02/20   Elayne Snare, MD  Boswellia-Glucosamine-Vit D (OSTEO BI-FLEX ONE PER DAY PO) Take by mouth.    [provider]  Cholecalciferol (VITAMIN D3 PO) Take by mouth daily.     [provider]  CINNAMON PO Take by mouth.    [provider]  cloNIDine (CATAPRES) 0.1 MG tablet Take 1 tablet (0.1 mg total) by mouth 2 (two) times daily. 02/28/21   Minus Breeding, MD  dapagliflozin propanediol (FARXIGA) 5 MG TABS tablet Take 1 tablet (5 mg total) by mouth daily. 01/18/21   Elayne Snare, MD  folic acid (FOLVITE) 1 MG tablet Take 1 tablet (1 mg total) by mouth daily. 10/28/19   Minus Breeding, MD  furosemide (LASIX) 20 MG tablet TAKE 1 TABLET BY MOUTH ONCE DAILY AS NEEDED FOR EDEMA  12/18/20   Minus Breeding, MD  Glucosamine-Chondroitin (OSTEO BI-FLEX REGULAR STRENGTH PO) Take 1 tablet by mouth daily after supper.    [provider]  glucose blood (FREESTYLE LITE) test strip USE AS INSTRUCTED TO TEST BLOOD SUGARS 3 TIMES DAILY. DX CODE-E11.65 (PAY FOR 100 IN 90 DAYS) 02/02/20   Elayne Snare, MD  hydrALAZINE (APRESOLINE) 50 MG tablet TAKE 1 TABLET BY MOUTH THREE TIMES A DAY 02/21/21   Minus Breeding, MD  insulin aspart (NOVOLOG) 100 UNIT/ML injection Use max of 140 units per day in Insulin pump.  (to replace Humalog 01/18/21   Elayne Snare, MD  insulin lispro (HUMALOG) 100 UNIT/ML injection Use max of 140 units daily via insulin pump. 01/18/21   Elayne Snare, MD  methotrexate (RHEUMATREX) 2.5 MG tablet Take by mouth.    [provider]  metoprolol tartrate (LOPRESSOR) 50 MG tablet TAKE 1 TABLET BY MOUTH TWICE DAILY 12/18/20   Lendon Colonel, NP  Misc Natural Products (TART CHERRY ADVANCED PO) Take by mouth.    [provider]  Multiple  Vitamins-Minerals (CENTRUM SILVER PO) Take by mouth daily.     [provider]  nitroGLYCERIN (NITROSTAT) 0.4 MG SL tablet Place 1 tablet (0.4 mg total) under the tongue every 5 (five) minutes as needed for chest pain. 08/06/18 06/19/20  Erlene Quan, PA-C  omeprazole (PRILOSEC) 20 MG capsule  07/04/20   [provider]  sertraline (ZOLOFT) 25 MG tablet Take 25 mg by mouth daily. Takes 1 tablet twice a day 02/25/20   [provider]  sildenafil (REVATIO) 20 MG tablet Take 20 mg by mouth 3 (three) times daily.    [provider]  tamsulosin (FLOMAX) 0.4 MG CAPS capsule Take 1 capsule (0.4 mg total) by mouth daily. 06/21/20   Ardis Hughs, MD  traZODone (DESYREL) 50 MG tablet TAKE 1 TABLET BY MOUTH AT BEDTIME AS NEEDED 90 05/31/20   [provider]  zolpidem (AMBIEN) 5 MG tablet Take 5 mg by mouth at bedtime. 06/01/20   [provider]    Family History Family History  Problem Relation Age of Onset   Diabetes Mother    Lung cancer Mother    Cancer Father    Coronary artery disease Father        MI at age 72.    Lung cancer Father    Sudden death Son    CAD Maternal Grandfather        MI in his 61's   CAD Paternal Grandfather    Breast cancer Neg Hx    Colon cancer Neg Hx    Pancreatic cancer Neg Hx     Social History Social History   Tobacco Use   Smoking status: Former    Packs/day: 2.00    Years: 25.00    Pack years: 50.00    Types: Cigarettes    Quit date: 09/11/1997    Years since quitting: 23.6   Smokeless tobacco: Never  Vaping Use   Vaping Use: Never used  Substance Use Topics   Alcohol use: Not Currently   Drug use: Never     Allergies   Nsaids   Review of Systems Review of Systems  Musculoskeletal:  Positive for arthralgias and joint swelling.  All other systems reviewed and are negative.   Physical Exam Triage Vital Signs ED Triage Vitals  Enc Vitals Group     BP 05/18/21 1235 (!) 160/57      Pulse Rate 05/18/21 1235 60  Resp 05/18/21 1235 16     Temp 05/18/21 1235 98.9 F (37.2 C)     Temp Source 05/18/21 1235 Oral     SpO2 05/18/21 1235 97 %     Weight --      Height --      Head Circumference --      Peak Flow --      Pain Score 05/18/21 1234 5     Pain Loc --      Pain Edu? --      Excl. in [place]? --    No data found.  Updated Vital Signs BP (!) 160/57 (BP Location: Right Arm)   Pulse 60   Temp 98.9 F (37.2 C) (Oral)   Resp 16   SpO2 97%   Visual Acuity Right Eye Distance:   Left Eye Distance:   Bilateral Distance:    Right Eye Near:   Left Eye Near:    Bilateral Near:     Physical Exam Vitals and nursing note reviewed.  Constitutional:      General: He is not in acute distress.    Appearance: Normal appearance. He is not ill-appearing, toxic-appearing or diaphoretic.  HENT:     Head: Normocephalic and atraumatic.  Eyes:     Conjunctiva/sclera: Conjunctivae normal.  Cardiovascular:     Rate and Rhythm: Normal rate.     Pulses: Normal pulses.  Pulmonary:     Effort: Pulmonary effort is normal.  Abdominal:     General: Abdomen is flat.  Musculoskeletal:     Right shoulder: Normal.     Left shoulder: Tenderness and bony tenderness present. Decreased range of motion.     Cervical back: Spasms and tenderness present. No bony tenderness. No pain with movement. Decreased range of motion.  Skin:    General: Skin is warm and dry.  Neurological:     General: No focal deficit present.     Mental Status: He is alert and oriented to person, place, and time.  Psychiatric:        Mood and Affect: Mood normal.     UC Treatments / Results  Labs (all labs ordered are listed, but only abnormal results are displayed) Labs Reviewed - No data to display  EKG   Radiology No results found.  Procedures Procedures (including critical care time)  Medications Ordered in UC Medications - No data to display  Initial Impression / Assessment and Plan  / UC Course  I have reviewed the triage vital signs and the nursing notes.  Pertinent labs & imaging results that were available during my care of the patient were reviewed by me and considered in my medical decision making (see chart for details).    Assessment negative for red flags or concerns.  Likely strain of left shoulder and muscle spasm.  May take Tylenol as needed for pain.  May take tizanidine as needed for muscle spasms but instructed not to take this medication prior to driving as it can cause drowsiness.  Encourage rest.  Discussed conservative symptom management.  Follow-up with orthopedics if symptoms do not improve in the next few days. Final Clinical Impressions(s) / UC Diagnoses   Final diagnoses:  Strain of left shoulder, initial encounter  Muscle spasm     Discharge Instructions      Take the Tylenol every 6 hours as needed for pain. You can take tizanidine every 6 hours as needed for muscle pain and spasms.  Do not take this medication prior  to driving as it can make you sleepy.   Rest as much as possible.  You can use heat, ice, or alternate between heat and ice for pain relief.  You can use lidocaine patches, Icy/Hot, biofreeze, or Voltaren gel as needed for comfort.   Follow up with sports medicine or orthopedics if symptoms do not improve in the next few days.      ED Prescriptions     Medication Sig Dispense Auth. Provider   acetaminophen (TYLENOL) 500 MG tablet Take 1 tablet (500 mg total) by mouth every 6 (six) hours as needed. 30 tablet Pearson Forster, NP   tiZANidine (ZANAFLEX) 4 MG tablet Take 1 tablet (4 mg total) by mouth every 6 (six) hours as needed for muscle spasms. 30 tablet Pearson Forster, NP      PDMP not reviewed this encounter.   Pearson Forster, NP 05/18/21 1330

## 2021-05-18 NOTE — ED Triage Notes (Signed)
Pt presents witt left shoulder pain and states it is painful to turn his neck and lift his arms up.

## 2021-05-23 ENCOUNTER — Ambulatory Visit: Payer: Medicare Other | Admitting: Endocrinology

## 2021-05-23 DIAGNOSIS — G729 Myopathy, unspecified: Secondary | ICD-10-CM | POA: Diagnosis not present

## 2021-05-23 DIAGNOSIS — U071 COVID-19: Secondary | ICD-10-CM | POA: Diagnosis not present

## 2021-05-24 ENCOUNTER — Encounter: Payer: Self-pay | Admitting: Endocrinology

## 2021-05-25 ENCOUNTER — Encounter: Payer: Self-pay | Admitting: Endocrinology

## 2021-05-25 ENCOUNTER — Other Ambulatory Visit: Payer: Self-pay

## 2021-05-25 ENCOUNTER — Ambulatory Visit (INDEPENDENT_AMBULATORY_CARE_PROVIDER_SITE_OTHER): Payer: Medicare Other | Admitting: Endocrinology

## 2021-05-25 VITALS — BP 122/68 | HR 57 | Ht 66.0 in | Wt 191.4 lb

## 2021-05-25 DIAGNOSIS — E1121 Type 2 diabetes mellitus with diabetic nephropathy: Secondary | ICD-10-CM

## 2021-05-25 DIAGNOSIS — E1165 Type 2 diabetes mellitus with hyperglycemia: Secondary | ICD-10-CM

## 2021-05-25 DIAGNOSIS — E782 Mixed hyperlipidemia: Secondary | ICD-10-CM | POA: Diagnosis not present

## 2021-05-25 DIAGNOSIS — Z794 Long term (current) use of insulin: Secondary | ICD-10-CM

## 2021-05-25 DIAGNOSIS — N183 Chronic kidney disease, stage 3 unspecified: Secondary | ICD-10-CM | POA: Diagnosis not present

## 2021-05-25 DIAGNOSIS — I2510 Atherosclerotic heart disease of native coronary artery without angina pectoris: Secondary | ICD-10-CM | POA: Diagnosis not present

## 2021-05-25 LAB — LIPID PANEL
Cholesterol: 126 mg/dL (ref 0–200)
HDL: 30.2 mg/dL — ABNORMAL LOW (ref >39.00)
LDL Cholesterol: 69 mg/dL (ref 0–99)
NonHDL: 96.06
Total CHOL/HDL Ratio: 4
Triglycerides: 137 mg/dL (ref 0.0–149.0)
VLDL: 27.4 mg/dL (ref 0.0–40.0)

## 2021-05-25 LAB — COMPREHENSIVE METABOLIC PANEL
ALT: 27 U/L (ref 0–53)
AST: 24 U/L (ref 0–37)
Albumin: 3.7 g/dL (ref 3.5–5.2)
Alkaline Phosphatase: 73 U/L (ref 39–117)
BUN: 23 mg/dL (ref 6–23)
CO2: 26 mEq/L (ref 19–32)
Calcium: 9.6 mg/dL (ref 8.4–10.5)
Chloride: 106 mEq/L (ref 96–112)
Creatinine, Ser: 1.84 mg/dL — ABNORMAL HIGH (ref 0.40–1.50)
GFR: 36.08 mL/min — ABNORMAL LOW (ref >60.00)
Glucose, Bld: 81 mg/dL (ref 70–99)
Potassium: 3.9 mEq/L (ref 3.5–5.1)
Sodium: 140 mEq/L (ref 135–145)
Total Bilirubin: 0.7 mg/dL (ref 0.2–1.2)
Total Protein: 7.2 g/dL (ref 6.0–8.3)

## 2021-05-25 LAB — MICROALBUMIN / CREATININE URINE RATIO
Creatinine,U: 156.6 mg/dL
Microalb Creat Ratio: 253.6 mg/g — ABNORMAL HIGH (ref 0.0–30.0)
Microalb, Ur: 397 mg/dL — ABNORMAL HIGH (ref 0.0–1.9)

## 2021-05-25 LAB — POCT GLYCOSYLATED HEMOGLOBIN (HGB A1C): Hemoglobin A1C: 6 % — AB (ref 4.0–5.6)

## 2021-05-25 NOTE — Progress Notes (Signed)
Patient ID: Alec [name], male   DOB: [birth year], 75 y.o.   MRN: [id number]             Reason for Appointment:  Follow-up for Type 2 Diabetes    History of Present Illness:          Diagnosis: Type 2 diabetes mellitus, date of diagnosis: 2001       Prior history: He was seen in consultation in 11/2014 when his A1c was 7.4 Since his blood sugars were averaging over 200 he was given Victoza in addition to his basal bolus insulin regimen in 6/16 He started on the V go pump in October 2016  Recent history:    INSULIN regimen  on T-Slim pump:    BASAL rate settings:  Midnight = 2.5, 3 AM = 2.2, 8 AM = 2.4, 4 PM = 2.7, 7 PM = 3.0 and 10 PM = 2.4  Boluses 12- 16 units at breakfast, 8 units lunch and about 14 at dinner  Current total basal insulin dose 60 units, total not calculated  Non-insulin hypoglycemic drugs the patient is taking are: None   His A1c is lower than expected for his sugars but unchanged at 6% This compares to recent average CGM reading of 148   Current blood sugar patterns from analysis of his Dexcom sensor download, daily management and problems identified:   He is tending to have high readings after lunch or in the afternoon likely due to be from late or inadequate boluses majority of the time  He is using fixed amounts of insulin for bolus calculation and does not feel comfortable doing carbohydrate counting  Compared to his last visit he is still having the same time in range  Currently blood sugars appear to be still well controlled without Victoza which he cannot afford  He was also recommended Marcelline Deist but this was too expensive  He is however gradually losing weight  Most of his activity level is with work  Glucose monitoring:  Dexcom Blood sugar readings are lowest overnight and highest in the afternoons  The average blood sugar is above 180 only between about 2 PM-4 PM otherwise below 180 LOWEST blood sugars are between about 1 AM-7 AM   OVERNIGHT blood sugars are generally well controlled with only occasional readings below normal around 12-1 AM and mild fluctuation with standard deviation 24  POSTPRANDIAL readings are generally well controlled after breakfast and dinner  However postprandial readings may tend to be high after lunch periodically  No significant hypoglycemia especially during the day except rarely after 4 PM   Statistics:    CGM use % of time 93  2-week average/SD 148+/-44  Time in range 75  % Time Above 180 25  % Time above 250   % Time Below 70 1      PRE-MEAL  overnight  mornings  afternoon  evening Overall  Glucose range:     55-287  Averages: 112 137 181 163    Prior   CGM use % of time  91  2-week average/SD  150  Time in range  75     %  % Time Above 180  25  % Time above 250   % Time Below 70 0      PRE-MEAL  overnight  mornings  afternoon  evening Overall  Glucose range:       Averages:  138  130  146  185  150     Self-care:  Meals: 3 meals per day. Lunch  1 pm Dinner 6 pm Breakfast is usually an egg sandwich but sometimes biscuits   Eating balanced meals in the evening.  He will have snacks with granola bars              Dietician visit, most recent: 06/2020.               Weight history:  Wt Readings from Last 3 Encounters:  05/25/21 191 lb 6.4 oz (86.8 kg)  02/14/21 196 lb 9.6 oz (89.2 kg)  01/18/21 201 lb 12.8 oz (91.5 kg)    Glycemic control:   Lab Results  Component Value Date   HGBA1C 6.0 (A) 05/25/2021   HGBA1C 6.0 01/09/2021   HGBA1C 5.9 09/11/2020   Lab Results  Component Value Date   MICROALBUR 397.0 (H) 05/25/2021   LDLCALC 69 05/25/2021   CREATININE 1.84 (H) 05/25/2021     Past history:  He was not symptomatic at time of diagnosis and was probably treated with Amaryl only.  No details are available of previous management However he thinks he was not given metformin at any time because of his "kidney problem" He had a A1c of over 9% in  2013 and may have been started on insulin at that time. Most likely has been taking Levemir and NovoLog since then A1c records show his results were over 8% in 2014 and in 4/15 but subsequently down to 7.1 in November 2015 Amaryl was stopped in 7/16  Other active problems are addressed in Review of systems   Allergies as of 05/25/2021       Reactions   Nsaids Other (See Comments)        Medication List        Accurate as of May 25, 2021 11:59 PM. If you have any questions, ask your nurse or doctor.          acetaminophen 500 MG tablet Commonly known as: TYLENOL Take 1 tablet (500 mg total) by mouth every 6 (six) hours as needed.   allopurinol 100 MG tablet Commonly known as: ZYLOPRIM Take 200 mg by mouth daily.   amLODipine 10 MG tablet Commonly known as: NORVASC Take 1 tablet (10 mg total) by mouth daily.   aspirin EC 81 MG tablet Take 81 mg by mouth daily.   atorvastatin 40 MG tablet Commonly known as: LIPITOR Take 1 tablet (40 mg total) by mouth daily.   CENTRUM SILVER PO Take by mouth daily.   CINNAMON PO Take by mouth.   cloNIDine 0.1 MG tablet Commonly known as: CATAPRES Take 1 tablet (0.1 mg total) by mouth 2 (two) times daily.   dapagliflozin propanediol 5 MG Tabs tablet Commonly known as: Farxiga Take 1 tablet (5 mg total) by mouth daily.   folic acid 1 MG tablet Commonly known as: FOLVITE Take 1 tablet (1 mg total) by mouth daily.   FreeStyle Freedom Lite w/Device Kit Use to check blood sugar three times daily. DX:E11.65   FREESTYLE LITE test strip Generic drug: glucose blood USE AS INSTRUCTED TO TEST BLOOD SUGARS 3 TIMES DAILY. DX CODE-E11.65 (PAY FOR 100 IN 90 DAYS)   furosemide 20 MG tablet Commonly known as: LASIX TAKE 1 TABLET BY MOUTH ONCE DAILY AS NEEDED FOR EDEMA   hydrALAZINE 50 MG tablet Commonly known as: APRESOLINE TAKE 1 TABLET BY MOUTH THREE TIMES A DAY   insulin aspart 100 UNIT/ML injection Commonly known as:  novoLOG Use max of 140 units per  day in Insulin pump.  (to replace Humalog   insulin lispro 100 UNIT/ML injection Commonly known as: HumaLOG Use max of 140 units daily via insulin pump.   methotrexate 2.5 MG tablet Commonly known as: RHEUMATREX Take by mouth.   metoprolol tartrate 50 MG tablet Commonly known as: LOPRESSOR TAKE 1 TABLET BY MOUTH TWICE DAILY   nitroGLYCERIN 0.4 MG SL tablet Commonly known as: NITROSTAT Place 1 tablet (0.4 mg total) under the tongue every 5 (five) minutes as needed for chest pain.   omeprazole 20 MG capsule Commonly known as: PRILOSEC   OSTEO BI-FLEX ONE PER DAY PO Take by mouth.   OSTEO BI-FLEX REGULAR STRENGTH PO Take 1 tablet by mouth daily after supper.   sertraline 25 MG tablet Commonly known as: ZOLOFT Take 25 mg by mouth daily. Takes 1 tablet twice a day   sildenafil 20 MG tablet Commonly known as: REVATIO Take 20 mg by mouth 3 (three) times daily.   tamsulosin 0.4 MG Caps capsule Commonly known as: FLOMAX Take 1 capsule (0.4 mg total) by mouth daily.   TART CHERRY ADVANCED PO Take by mouth.   tiZANidine 4 MG tablet Commonly known as: Zanaflex Take 1 tablet (4 mg total) by mouth every 6 (six) hours as needed for muscle spasms.   traZODone 50 MG tablet Commonly known as: DESYREL TAKE 1 TABLET BY MOUTH AT BEDTIME AS NEEDED 90   VITAMIN D3 PO Take by mouth daily.   zolpidem 5 MG tablet Commonly known as: AMBIEN Take 5 mg by mouth at bedtime.        Allergies:  Allergies  Allergen Reactions   Nsaids Other (See Comments)    Past Medical History:  Diagnosis Date   Anxiety    Arthritis    Bilateral lower extremity edema    Chronic gout    06-19-2020  per pt last episode 6 months , great toe   CKD (chronic kidney disease), stage III ([place])    followed by pcp   Coronary artery disease cardiologist--- dr hochrein   a. nuclear study 04-25-2017 low risk b. ETT abnormal 07-07-2018  c.  cardiac cath 07-15-2018   severe multivessel disease   d.  CABG x3   Degenerative arthritis of shoulder region 08/2013   left   DOE (dyspnea on exertion)    History of bladder cancer 07/2008   s/p  TURBT   History of cellulitis 11/2017   left upper arm   History of kidney stones    Hyperlipidemia    Hypertension    followed by pcp/ cardiology   IDDM (insulin dependent diabetes mellitus)    endocrinologist--- dr Dwyane Dee---  pt uses insulin pump and dexcom  (06-19-2020 per pt fasting sugar-- 98--110)   Insulin pump in place    OSA (obstructive sleep apnea)    Prostate cancer [facility]) urologist--- dr herrick/  oncologist--- [place]   dx 11/ 2017  Gleason 3+3 active survillance;  bx 03-14-2020 Gleason 3+4   RA (rheumatoid arthritis) ([place])    rhemotologist--- dr Normajean Glasgow---     S/P CABG x 3 07/15/2018   LIMA to LAD;  SVG to PDA;  SVG to RI    Past Surgical History:  Procedure Laterality Date   CHOLECYSTECTOMY  08/29/2012   Procedure: LAPAROSCOPIC CHOLECYSTECTOMY WITH INTRAOPERATIVE CHOLANGIOGRAM;  Surgeon: Imogene Burn. Georgette Dover, MD;  Location: [facility];  Service: General;  Laterality: N/A;   CORONARY ARTERY BYPASS GRAFT N/A 07/20/2018   Procedure: CORONARY ARTERY BYPASS GRAFTING (CABG) times three  using left internal mammary artery to the LAD, and using endoscopically harvested right saphenous vein to PDA and intermedius.;  Surgeon: Grace Isaac, MD;  Location: [place];  Service: Open Heart Surgery;  Laterality: N/A;   LEFT HEART CATH AND CORONARY ANGIOGRAPHY N/A 07/15/2018   Procedure: LEFT HEART CATH AND CORONARY ANGIOGRAPHY;  Surgeon: Leonie Man, MD;  Location: [place] [facility];  Service: Cardiovascular;  Laterality: N/A;   RADIOACTIVE SEED IMPLANT N/A 06/21/2020   Procedure: RADIOACTIVE SEED IMPLANT/BRACHYTHERAPY IMPLANT;  Surgeon: Ardis Hughs, MD;  Location: [facility];  Service: Urology;  Laterality: N/A;   SHOULDER ARTHROSCOPY W/ ROTATOR CUFF REPAIR Right 09/03/2013   SHOULDER  ARTHROSCOPY WITH SUBACROMIAL DECOMPRESSION, ROTATOR CUFF REPAIR AND BICEP TENDON REPAIR Left 09/03/2013   Procedure: LEFT SHOULDER ARTHROSCOPY WITH EXTENSIVE DEBRIDMENT, DISTAL CLAVICULECTOMY, ROTATOR CUFF REPAIR AND SUBACROMIAL DECOMPRESSION PARTIAL ACRIOMIOPLASTY WITH CORACOACROMIAL RELEASE;  Surgeon: Renette Butters, MD;  Location: [place];  Service: Orthopedics;  Laterality: Left;   SPACE OAR INSTILLATION N/A 06/21/2020   Procedure: SPACE OAR INSTILLATION;  Surgeon: Ardis Hughs, MD;  Location: [facility];  Service: Urology;  Laterality: N/A;   TEE WITHOUT CARDIOVERSION N/A 07/20/2018   Procedure: TRANSESOPHAGEAL ECHOCARDIOGRAM (TEE);  Surgeon: Grace Isaac, MD;  Location: [place];  Service: Open Heart Surgery;  Laterality: N/A;   TRANSURETHRAL RESECTION OF BLADDER TUMOR WITH MITOMYCIN-C  08/24/2008   '@[facility]'$     Family History  Problem Relation Age of Onset   Diabetes Mother    Lung cancer Mother    Cancer Father    Coronary artery disease Father        MI at age 13.    Lung cancer Father    Sudden death Son    CAD Maternal Grandfather        MI in his 11's   CAD Paternal Grandfather    Breast cancer Neg Hx    Colon cancer Neg Hx    Pancreatic cancer Neg Hx     Social History:  reports that he quit smoking about 23 years ago. His smoking use included cigarettes. He has a 50.00 pack-year smoking history. He has never used smokeless tobacco. He reports that he does not currently use alcohol. He reports that he does not use drugs.    Review of Systems      HYPERTENSION: Blood pressure followed by cardiologist and nephrologist He also checks at home  He is not on ACE or ARB as part of the management  BP Readings from Last 3 Encounters:  05/25/21 122/68  05/18/21 (!) 160/57  02/14/21 130/60           Lipids: he is taking Lipitor 40 mg from his PCP Lipids are controlled except for low HDL      Lab Results  Component Value Date    CHOL 126 05/25/2021   HDL 30.20 (L) 05/25/2021   LDLCALC 69 05/25/2021   TRIG 137.0 05/25/2021   CHOLHDL 4 05/25/2021               CKD: He has been followed by nephrologist for diabetic nephropathy.   Last urine microalbumin ratio is about 200 He is not on an ACE inhibitor  His creatinine is variable but overall stable   Lab Results  Component Value Date   CREATININE 1.84 (H) 05/25/2021   CREATININE 1.99 (H) 09/11/2020   CREATININE 1.92 (H) 06/19/2020   Lab Results  Component Value Date  K 3.9 05/25/2021    Last foot exam in 08/2019  Has neuropathic symptoms controlled with low-dose gabapentin, also on Requip from PCP   Eye exam last:  10/20   Physical Examination:  BP 122/68   Pulse (!) 57   Ht $R'5\' 6"'vc$  (1.676 m)   Wt 191 lb 6.4 oz (86.8 kg)   SpO2 98%   BMI 30.89 kg/m       ASSESSMENT:  Diabetes type 2, insulin requiring  See history of present illness for detailed discussion of his current management, blood sugar patterns and problems identified  His A1c is lower than expected for his blood sugar average of 148 and is again 6%  He is on the T-insulin pump with the Dexcom sensor  He has excellent control overall  His boluses are mostly fixed amount and not counting carbohydrates or adjusting much based on what he is eating  Also despite not taking the GLP-1 drug on Farxiga his blood sugar control is good and he is only at times having postprandial hyperglycemia  This is likely to be from his taking lower doses on his own at lunchtime and sometimes forgetting to bolus on time  As above he may be getting low normal readings overnight especially around midnight at times  Renal dysfunction: Not progressing  LIPIDS: Excellent control with LDL below 70    PLAN: Basal rate was changed and she is down to 2.3  Consider increasing afternoon basal but for now we will take at least 10 units at lunch instead of 8 units  He will take larger amounts for more  carbohydrate both at lunch and dinner  7 PM-10 PM will be 3.0, higher He will look into the patient assistance program for [place] which will bring him benefits in several ways and this was discussed again    Follow-up in 3 months   Patient Instructions  12 am to 3 am is 2.3 basal  10 units for lunch       Elayne Snare 05/27/2021, 8:28 PM   Note: This office note was prepared with Dragon voice recognition system technology. Any transcriptional errors that result from this process are unintentional.

## 2021-05-25 NOTE — Patient Instructions (Signed)
12 am to 3 am is 2.3 basal  10 units for lunch

## 2021-06-09 ENCOUNTER — Other Ambulatory Visit: Payer: Self-pay | Admitting: Adult Health

## 2021-06-09 DIAGNOSIS — M542 Cervicalgia: Secondary | ICD-10-CM | POA: Diagnosis not present

## 2021-06-09 DIAGNOSIS — M25511 Pain in right shoulder: Secondary | ICD-10-CM | POA: Diagnosis not present

## 2021-06-22 DIAGNOSIS — Z23 Encounter for immunization: Secondary | ICD-10-CM | POA: Diagnosis not present

## 2021-06-22 DIAGNOSIS — C61 Malignant neoplasm of prostate: Secondary | ICD-10-CM | POA: Diagnosis not present

## 2021-06-22 DIAGNOSIS — N2581 Secondary hyperparathyroidism of renal origin: Secondary | ICD-10-CM | POA: Diagnosis not present

## 2021-06-22 DIAGNOSIS — I2510 Atherosclerotic heart disease of native coronary artery without angina pectoris: Secondary | ICD-10-CM | POA: Diagnosis not present

## 2021-06-22 DIAGNOSIS — I129 Hypertensive chronic kidney disease with stage 1 through stage 4 chronic kidney disease, or unspecified chronic kidney disease: Secondary | ICD-10-CM | POA: Diagnosis not present

## 2021-06-22 DIAGNOSIS — E782 Mixed hyperlipidemia: Secondary | ICD-10-CM | POA: Diagnosis not present

## 2021-06-22 DIAGNOSIS — S80211A Abrasion, right knee, initial encounter: Secondary | ICD-10-CM | POA: Diagnosis not present

## 2021-06-22 DIAGNOSIS — G4733 Obstructive sleep apnea (adult) (pediatric): Secondary | ICD-10-CM | POA: Diagnosis not present

## 2021-06-22 DIAGNOSIS — E1122 Type 2 diabetes mellitus with diabetic chronic kidney disease: Secondary | ICD-10-CM | POA: Diagnosis not present

## 2021-06-22 DIAGNOSIS — Z0000 Encounter for general adult medical examination without abnormal findings: Secondary | ICD-10-CM | POA: Diagnosis not present

## 2021-06-22 DIAGNOSIS — M069 Rheumatoid arthritis, unspecified: Secondary | ICD-10-CM | POA: Diagnosis not present

## 2021-06-22 DIAGNOSIS — F411 Generalized anxiety disorder: Secondary | ICD-10-CM | POA: Diagnosis not present

## 2021-06-25 DIAGNOSIS — R310 Gross hematuria: Secondary | ICD-10-CM | POA: Diagnosis not present

## 2021-06-25 DIAGNOSIS — Z8546 Personal history of malignant neoplasm of prostate: Secondary | ICD-10-CM | POA: Diagnosis not present

## 2021-06-25 DIAGNOSIS — C678 Malignant neoplasm of overlapping sites of bladder: Secondary | ICD-10-CM | POA: Diagnosis not present

## 2021-07-05 DIAGNOSIS — M0609 Rheumatoid arthritis without rheumatoid factor, multiple sites: Secondary | ICD-10-CM | POA: Diagnosis not present

## 2021-07-05 DIAGNOSIS — M1A00X0 Idiopathic chronic gout, unspecified site, without tophus (tophi): Secondary | ICD-10-CM | POA: Diagnosis not present

## 2021-07-05 DIAGNOSIS — Z79899 Other long term (current) drug therapy: Secondary | ICD-10-CM | POA: Diagnosis not present

## 2021-07-05 DIAGNOSIS — Z8739 Personal history of other diseases of the musculoskeletal system and connective tissue: Secondary | ICD-10-CM | POA: Diagnosis not present

## 2021-07-13 ENCOUNTER — Telehealth: Payer: Self-pay | Admitting: Endocrinology

## 2021-07-13 NOTE — Telephone Encounter (Signed)
[employer] calling to receive fax back of recent chart notes and a prefilled LMN. Fax to [phone]

## 2021-07-17 NOTE — Telephone Encounter (Signed)
Waiting for Dr Dwyane Dee to sign

## 2021-07-19 NOTE — Telephone Encounter (Signed)
Paperwork faxed to [employer]

## 2021-07-26 DIAGNOSIS — L570 Actinic keratosis: Secondary | ICD-10-CM | POA: Diagnosis not present

## 2021-07-26 DIAGNOSIS — L819 Disorder of pigmentation, unspecified: Secondary | ICD-10-CM | POA: Diagnosis not present

## 2021-09-11 DIAGNOSIS — H401131 Primary open-angle glaucoma, bilateral, mild stage: Secondary | ICD-10-CM | POA: Diagnosis not present

## 2021-09-11 DIAGNOSIS — H25813 Combined forms of age-related cataract, bilateral: Secondary | ICD-10-CM | POA: Diagnosis not present

## 2021-09-18 ENCOUNTER — Other Ambulatory Visit: Payer: Self-pay | Admitting: Adult Health

## 2021-09-25 ENCOUNTER — Other Ambulatory Visit: Payer: Self-pay

## 2021-09-25 ENCOUNTER — Other Ambulatory Visit: Payer: Self-pay | Admitting: Endocrinology

## 2021-09-25 ENCOUNTER — Ambulatory Visit (INDEPENDENT_AMBULATORY_CARE_PROVIDER_SITE_OTHER): Payer: Medicare Other | Admitting: Endocrinology

## 2021-09-25 ENCOUNTER — Encounter: Payer: Self-pay | Admitting: Endocrinology

## 2021-09-25 VITALS — BP 154/82 | HR 74 | Ht 66.0 in | Wt 204.0 lb

## 2021-09-25 DIAGNOSIS — I10 Essential (primary) hypertension: Secondary | ICD-10-CM | POA: Diagnosis not present

## 2021-09-25 DIAGNOSIS — I2510 Atherosclerotic heart disease of native coronary artery without angina pectoris: Secondary | ICD-10-CM

## 2021-09-25 DIAGNOSIS — Z794 Long term (current) use of insulin: Secondary | ICD-10-CM | POA: Diagnosis not present

## 2021-09-25 DIAGNOSIS — E1121 Type 2 diabetes mellitus with diabetic nephropathy: Secondary | ICD-10-CM

## 2021-09-25 DIAGNOSIS — E1165 Type 2 diabetes mellitus with hyperglycemia: Secondary | ICD-10-CM

## 2021-09-25 LAB — POCT GLYCOSYLATED HEMOGLOBIN (HGB A1C): Hemoglobin A1C: 5.8 % — AB (ref 4.0–5.6)

## 2021-09-25 NOTE — Progress Notes (Signed)
Patient ID: Alec [name], male   DOB: 08/11/1946, 75 y.o.   MRN: [id number]             Reason for Appointment:  Follow-up for Type 2 Diabetes    History of Present Illness:          Diagnosis: Type 2 diabetes mellitus, date of diagnosis: 2001       Prior history: He was seen in consultation in 11/2014 when his A1c was 7.4 Since his blood sugars were averaging over 200 he was given Victoza in addition to his basal bolus insulin regimen in 6/16 He started on the V go pump in October 2016  Recent history:    INSULIN regimen  on T-Slim pump:    BASAL rate settings:  Midnight = 2.3, 3 AM = 2.2, 8 AM = 2.4, 4 PM = 2.7, 7 PM = 3.0 and 10 PM = 2.4  Boluses 12- 16 units at breakfast, 8 units lunch and about 14 at dinner  Current total basal insulin dose 60 units, total not calculated  Non-insulin hypoglycemic drugs the patient is taking are: None   His A1c is lower than expected for his sugars at 5.8 This compares to last 2-week average CGM reading of 148   Current blood sugar patterns from analysis of his Dexcom sensor download, daily management and problems identified:   Although blood sugars are overall about the same with unchanged time and target he has gained weight Previously was likely doing better with Victoza and also recommended [place] but he cannot afford them  but only 1% of the readings below he is tending to have high readings after lunch or in the afternoon likely due to be from late or inadequate boluses majority of the time  He is using fixed amounts of insulin for bolus calculation and before and sometimes does not adjust the dose enough for larger meals or more carbohydrate As before with some of his hyperglycemia is related to late or missed boluses especially at lunchtime No consistent pattern seen with only occasional overnight hypoglycemia although less than before with reducing his midnight basal rate He is concerned about his weight gain  Glucose  monitoring:  Dexcom  Summary of patterns: HYPERGLYCEMIA is mostly occurring midday and sporadically in the evenings at various times but on an average blood sugar is within the target range all 24 hours; hypoglycemia has occurred only rarely around 1-2 AM HYPOGLYCEMIA is rare with only occasional overnight low readings transiently OVERNIGHT readings are generally well controlled, may be higher around midnight based on the reading the night.  But usually excellent early morning without hypoglycemia which occurs only 1% of the time around 12-2 AM Blood sugar readings are lowest overnight and highest around 3-6 PM POSTPRANDIAL readings are usually well controlled after breakfast except once; after LUNCH blood sugars are periodically higher over 180 but on an average still within target; after DINNERTIME blood sugars remain exceeding the target only on occasion and no postprandial hypoglycemia also  Highest readings by segment of the day are averaging 168 after 6 PM   Statistics:    CGM use % of time   2-week average/SD 148+/-47  Time in range       75 %  % Time Above 180 24  % Time above 250   % Time Below 70 0      PRE-MEAL  overnight  mornings  afternoon  evening Overall  Glucose range:     49-367  Averages:  127 130 165 168 148   PREVIOUSLY:   CGM use % of time 93  2-week average/SD 148+/-44  Time in range 75  % Time Above 180 25  % Time above 250   % Time Below 70 1      PRE-MEAL  overnight  mornings  afternoon  evening Overall  Glucose range:     55-287  Averages: 112 137 181 163     Self-care:  Meals: 3 meals per day. Lunch  1 pm Dinner 6 pm Breakfast is usually an egg sandwich but sometimes biscuits   Eating balanced meals in the evening.  He will have snacks with granola bars              Dietician visit, most recent: 06/2020.               Weight history:  Wt Readings from Last 3 Encounters:  09/25/21 204 lb (92.5 kg)  05/25/21 191 lb 6.4 oz (86.8 kg)   02/14/21 196 lb 9.6 oz (89.2 kg)    Glycemic control:   Lab Results  Component Value Date   HGBA1C 5.8 (A) 09/25/2021   HGBA1C 6.0 (A) 05/25/2021   HGBA1C 6.0 01/09/2021   Lab Results  Component Value Date   MICROALBUR 397.0 (H) 05/25/2021   LDLCALC 69 05/25/2021   CREATININE 1.84 (H) 05/25/2021     Past history:  He was not symptomatic at time of diagnosis and was probably treated with Amaryl only.  No details are available of previous management However he thinks he was not given metformin at any time because of his "kidney problem" He had a A1c of over 9% in 2013 and may have been started on insulin at that time. Most likely has been taking Levemir and NovoLog since then A1c records show his results were over 8% in 2014 and in 4/15 but subsequently down to 7.1 in November 2015 Amaryl was stopped in 7/16  Other active problems are addressed in Review of systems   Allergies as of 09/25/2021       Reactions   Nsaids Other (See Comments)        Medication List        Accurate as of September 25, 2021  9:56 AM. If you have any questions, ask your nurse or doctor.          STOP taking these medications    furosemide 20 MG tablet Commonly known as: LASIX Stopped by: Elayne Snare, MD       TAKE these medications    acetaminophen 500 MG tablet Commonly known as: TYLENOL Take 1 tablet (500 mg total) by mouth every 6 (six) hours as needed.   allopurinol 100 MG tablet Commonly known as: ZYLOPRIM Take 200 mg by mouth daily.   amLODipine 10 MG tablet Commonly known as: NORVASC Take 1 tablet (10 mg total) by mouth daily. **Patient need a appointment for future refill**   aspirin EC 81 MG tablet Take 81 mg by mouth daily.   atorvastatin 40 MG tablet Commonly known as: LIPITOR Take 1 tablet (40 mg total) by mouth daily.   CENTRUM SILVER PO Take by mouth daily.   CINNAMON PO Take by mouth.   cloNIDine 0.1 MG tablet Commonly known as: CATAPRES Take  1 tablet (0.1 mg total) by mouth 2 (two) times daily.   dapagliflozin propanediol 5 MG Tabs tablet Commonly known as: Farxiga Take 1 tablet (5 mg total) by mouth daily.  folic acid 1 MG tablet Commonly known as: FOLVITE Take 1 tablet (1 mg total) by mouth daily.   FreeStyle Freedom Lite w/Device Kit Use to check blood sugar three times daily. DX:E11.65   FREESTYLE LITE test strip Generic drug: glucose blood USE AS INSTRUCTED TO TEST BLOOD SUGARS 3 TIMES DAILY. DX CODE-E11.65 (PAY FOR 100 IN 90 DAYS)   hydrALAZINE 50 MG tablet Commonly known as: APRESOLINE TAKE 1 TABLET BY MOUTH THREE TIMES A DAY   insulin aspart 100 UNIT/ML injection Commonly known as: novoLOG Use max of 140 units per day in Insulin pump.  (to replace Humalog   insulin lispro 100 UNIT/ML injection Commonly known as: HumaLOG Use max of 140 units daily via insulin pump.   methotrexate 2.5 MG tablet Commonly known as: RHEUMATREX Take by mouth.   metoprolol tartrate 50 MG tablet Commonly known as: LOPRESSOR TAKE 1 TABLET BY MOUTH TWICE A DAY   nitroGLYCERIN 0.4 MG SL tablet Commonly known as: NITROSTAT Place 1 tablet (0.4 mg total) under the tongue every 5 (five) minutes as needed for chest pain.   omeprazole 20 MG capsule Commonly known as: PRILOSEC   OSTEO BI-FLEX ONE PER DAY PO Take by mouth.   OSTEO BI-FLEX REGULAR STRENGTH PO Take 1 tablet by mouth daily after supper.   sertraline 25 MG tablet Commonly known as: ZOLOFT Take 25 mg by mouth daily. Takes 1 tablet twice a day   sildenafil 20 MG tablet Commonly known as: REVATIO Take 20 mg by mouth 3 (three) times daily.   tamsulosin 0.4 MG Caps capsule Commonly known as: FLOMAX Take 1 capsule (0.4 mg total) by mouth daily.   TART CHERRY ADVANCED PO Take by mouth.   tiZANidine 4 MG tablet Commonly known as: Zanaflex Take 1 tablet (4 mg total) by mouth every 6 (six) hours as needed for muscle spasms.   traZODone 50 MG tablet Commonly  known as: DESYREL   VITAMIN D3 PO Take by mouth daily.   zolpidem 5 MG tablet Commonly known as: AMBIEN Take 5 mg by mouth at bedtime.        Allergies:  Allergies  Allergen Reactions   Nsaids Other (See Comments)    Past Medical History:  Diagnosis Date   Anxiety    Arthritis    Bilateral lower extremity edema    Chronic gout    06-19-2020  per pt last episode 6 months , great toe   CKD (chronic kidney disease), stage III ([place])    followed by pcp   Coronary artery disease cardiologist--- dr hochrein   a. nuclear study 04-25-2017 low risk b. ETT abnormal 07-07-2018  c.  cardiac cath 07-15-2018  severe multivessel disease   d.  CABG x3   Degenerative arthritis of shoulder region 08/2013   left   DOE (dyspnea on exertion)    History of bladder cancer 07/2008   s/p  TURBT   History of cellulitis 11/2017   left upper arm   History of kidney stones    Hyperlipidemia    Hypertension    followed by pcp/ cardiology   IDDM (insulin dependent diabetes mellitus)    endocrinologist--- dr Dwyane Dee---  pt uses insulin pump and dexcom  (06-19-2020 per pt fasting sugar-- 98--110)   Insulin pump in place    OSA (obstructive sleep apnea)    Prostate cancer [facility]) urologist--- dr herrick/  oncologist--- [place]   dx 11/ 2017  Gleason 3+3 active survillance;  bx 03-14-2020 Gleason 3+4  RA (rheumatoid arthritis) ([place])    rhemotologist--- dr Normajean Glasgow---     S/P CABG x 3 07/15/2018   LIMA to LAD;  SVG to PDA;  SVG to RI    Past Surgical History:  Procedure Laterality Date   CHOLECYSTECTOMY  08/29/2012   Procedure: LAPAROSCOPIC CHOLECYSTECTOMY WITH INTRAOPERATIVE CHOLANGIOGRAM;  Surgeon: Imogene Burn. Tsuei, MD;  Location: [facility];  Service: General;  Laterality: N/A;   CORONARY ARTERY BYPASS GRAFT N/A 07/20/2018   Procedure: CORONARY ARTERY BYPASS GRAFTING (CABG) times three using left internal mammary artery to the LAD, and using endoscopically harvested right saphenous vein to PDA  and intermedius.;  Surgeon: Grace Isaac, MD;  Location: [facility];  Service: Open Heart Surgery;  Laterality: N/A;   LEFT HEART CATH AND CORONARY ANGIOGRAPHY N/A 07/15/2018   Procedure: LEFT HEART CATH AND CORONARY ANGIOGRAPHY;  Surgeon: Leonie Man, MD;  Location: [facility];  Service: Cardiovascular;  Laterality: N/A;   RADIOACTIVE SEED IMPLANT N/A 06/21/2020   Procedure: RADIOACTIVE SEED IMPLANT/BRACHYTHERAPY IMPLANT;  Surgeon: Ardis Hughs, MD;  [facility];  Service: Urology;  Laterality: N/A;   SHOULDER ARTHROSCOPY W/ ROTATOR CUFF REPAIR Right 09/03/2013   SHOULDER ARTHROSCOPY WITH SUBACROMIAL DECOMPRESSION, ROTATOR CUFF REPAIR AND BICEP TENDON REPAIR Left 09/03/2013   Procedure: LEFT SHOULDER ARTHROSCOPY WITH EXTENSIVE DEBRIDMENT, DISTAL CLAVICULECTOMY, ROTATOR CUFF REPAIR AND SUBACROMIAL DECOMPRESSION PARTIAL ACRIOMIOPLASTY WITH CORACOACROMIAL RELEASE;  Surgeon: Renette Butters, MD;  Location: [facility];  Service: Orthopedics;  Laterality: Left;   SPACE OAR INSTILLATION N/A 06/21/2020   Procedure: SPACE OAR INSTILLATION;  Surgeon: Ardis Hughs, MD;  Location: [facility];  Service: Urology;  Laterality: N/A;   TEE WITHOUT CARDIOVERSION N/A 07/20/2018   Procedure: TRANSESOPHAGEAL ECHOCARDIOGRAM (TEE);  Surgeon: Grace Isaac, MD;  Location: [facility];  Service: Open Heart Surgery;  Laterality: N/A;   TRANSURETHRAL RESECTION OF BLADDER TUMOR WITH MITOMYCIN-C  08/24/2008   _0     Family History  Problem Relation Age of Onset   Diabetes Mother    Lung cancer Mother    Cancer Father    Coronary artery disease Father        MI at age 57.    Lung cancer Father    Sudden death Son    CAD Maternal Grandfather        MI in his 71's   CAD Paternal Grandfather    Breast cancer Neg Hx    Colon cancer Neg Hx    Pancreatic cancer Neg Hx     Social History:  reports that he quit smoking about 24 years ago.  His smoking use included cigarettes. He has a 50.00 pack-year smoking history. He has never used smokeless tobacco. He reports that he does not currently use alcohol. He reports that he does not use drugs.    Review of Systems      HYPERTENSION: Blood pressure followed by cardiologist and nephrologist He also checks at home  He is not on ACE or ARB as part of the management  BP Readings from Last 3 Encounters:  09/25/21 (!) 154/82  05/25/21 122/68  05/18/21 (!) 160/57           Lipids: he is taking Lipitor 40 mg from his PCP Lipids are controlled except for low HDL      Lab Results  Component Value Date   CHOL 126 05/25/2021   HDL 30.20 (L) 05/25/2021   LDLCALC 69 05/25/2021  TRIG 137.0 05/25/2021   CHOLHDL 4 05/25/2021               CKD: He has been followed by nephrologist for diabetic nephropathy.   Last urine microalbumin ratio is about 200 He is not on an ACE inhibitor/ARB drug  His creatinine is variable but overall stable   Lab Results  Component Value Date   CREATININE 1.84 (H) 05/25/2021   CREATININE 1.99 (H) 09/11/2020   CREATININE 1.92 (H) 06/19/2020   Lab Results  Component Value Date   K 3.9 05/25/2021    Last foot exam in 12/22  Has neuropathic symptoms controlled with low-dose gabapentin, also on Requip from PCP   Eye exam last:  6/22   Physical Examination:  BP (!) 154/82 (BP Location: Left Arm, Patient Position: Sitting, Cuff Size: Small)    Pulse 74    Ht $R'5\' 6"'Ec$  (1.676 m)    Wt 204 lb (92.5 kg)    SpO2 96%    BMI 32.93 kg/m      Diabetic Foot Exam - Simple   Simple Foot Form Diabetic Foot exam was performed with the following findings: Yes   Visual Inspection No deformities, no ulcerations, no other skin breakdown bilaterally: Yes Sensation Testing Intact to touch and monofilament testing bilaterally: Yes Pulse Check Posterior Tibialis and Dorsalis pulse intact bilaterally: Yes Comments     1+ ankle edema present  bilaterally  ASSESSMENT:  Diabetes type 2, insulin requiring  See history of present illness for detailed discussion of his current management, blood sugar patterns and problems identified  His A1c is lower than expected for his blood sugar average of 148 and is 5.8 %  He is on the T-insulin pump with the control IQ  He has good control overall with 75% within the target range  He still has an adequate coverage of some of his meals especially lunchtime either from eating larger meals at times or more likely getting late for his boluses, at least once has missed bolus at lunch Also may be late for boluses in the evening also Is likely gaining weight from not taking [place] and Victoza is also likely inadequate exercise and higher carbohydrate intake lately Hypoglycemia has been minimal  Renal dysfunction from diabetic nephropathy: Followed by nephrologist once a year Again discussed benefits of Farxiga for renal protection since he is not on any ACE inhibitor or ARB  HYPERTENSION: He says his blood pressure is variable and he will follow-up with cardiologist  Ankle edema: Likely to be from amlodipine  LIPIDS: Excellent control with LDL below 70    PLAN: Basal rate at midnight will be 2.15 and this was changed in the office Reminded him to bolus before starting each meal He likely needs another 2 units to cover his lunch when eating out or eating larger meals We will see if [place] is covered by his insurance next year For economy can try it using a 10 mg tablet He will discuss his blood pressure medications, potentially using ARB drugs and Farxiga as well as diuretics with his cardiologist and nephrologist    Follow-up in 3 months   Patient Instructions  Check on Faxriga, can try 1/2 of $Remo'10mg'YKJGL$  daily  Try reducing Carbs  Bolus BEFORE starting to eat especially at lunch  Basal 2.15 at 12 am         Elayne Snare 09/25/2021, 9:56 AM   Note: This office note was  prepared with Dragon voice recognition system technology. Any  transcriptional errors that result from this process are unintentional.

## 2021-09-25 NOTE — Patient Instructions (Signed)
Check on Faxriga, can try 1/2 of 10mg  daily  Try reducing Carbs  Bolus BEFORE starting to eat especially at lunch  Basal 2.15 at 12 am

## 2021-09-26 ENCOUNTER — Other Ambulatory Visit: Payer: Self-pay | Admitting: Endocrinology

## 2021-09-27 ENCOUNTER — Other Ambulatory Visit: Payer: Self-pay

## 2021-09-27 DIAGNOSIS — E1165 Type 2 diabetes mellitus with hyperglycemia: Secondary | ICD-10-CM

## 2021-09-27 MED ORDER — HUMALOG 100 UNIT/ML ~~LOC~~ SOLN: SUBCUTANEOUS | 1 refills | Status: DC

## 2021-09-28 ENCOUNTER — Telehealth: Payer: Self-pay | Admitting: Cardiology

## 2021-09-28 NOTE — Telephone Encounter (Signed)
Spoke to patient he stated he has been having sob for the past 2 to 3 weeks.No chest pain.Swelling in both lower legs and feet.Stated has gained weight 16 lbs within 3 to 4 months.Appointment scheduled with Dr.Hochrein 1/6 at 4:30 pm.Advised if sob becomes worse he needs to go to ED.

## 2021-09-28 NOTE — Telephone Encounter (Signed)
Pt c/o Shortness Of Breath: STAT if SOB developed within the last 24 hours or pt is noticeably SOB on the phone  1. Are you currently SOB (can you hear that pt is SOB on the phone)?  No   2. How long have you been experiencing SOB?  Past few weeks   3. Are you SOB when sitting or when up moving around?  When up and moving around   4. Are you currently experiencing any other symptoms?  No

## 2021-10-04 NOTE — Progress Notes (Addendum)
Cardiology Office Note   Date:  10/06/2021   ID:  [name], DOB 1945/11/19, MRN [id number]  PCP:  Mayra Neer, MD  Cardiologist:   Minus Breeding, MD   Chief Complaint  Patient presents with   Shortness of Breath      History of Present Illness: Alec [name] is a 76 y.o. male who presents for follow up of CAD status post CABG. he had COVID earlier this year.  He said he lost weight with this by exam back.  He is not sure in one timeframe he has gained weight.  He has had increasing shortness of breath.  This is been going on for about 3 months.  He describes what sounds like PND or orthopnea.  He does not wake up and get up out of bed because he is short of breath.  However, it happens 3 or 4 hours after he goes to bed.  He feels his heart racing sometimes at night but not during the day.  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  He is not describing substernal chest pressure, neck or arm discomfort.  He is not having any edema.  There was a mention of this on a telephone note but he denies this.  He says his symptoms are somewhat reminiscent of his previous angina.   Past Medical History:  Diagnosis Date   Anxiety    Arthritis    Bilateral lower extremity edema    Chronic gout    06-19-2020  per pt last episode 6 months , great toe   CKD (chronic kidney disease), stage III ([place])    followed by pcp   Coronary artery disease cardiologist--- dr Verenis [place]   a. nuclear study 04-25-2017 low risk b. ETT abnormal 07-07-2018  c.  cardiac cath 07-15-2018  severe multivessel disease   d.  CABG x3   Degenerative arthritis of shoulder region 08/2013   left   DOE (dyspnea on exertion)    History of bladder cancer 07/2008   s/p  TURBT   History of cellulitis 11/2017   left upper arm   History of kidney stones    Hyperlipidemia    Hypertension    followed by pcp/ cardiology   IDDM (insulin dependent  diabetes mellitus)    endocrinologist--- dr Dwyane Dee---  pt uses insulin pump and dexcom  (06-19-2020 per pt fasting sugar-- 98--110)   Insulin pump in place    OSA (obstructive sleep apnea)    Prostate cancer [facility]) urologist--- dr herrick/  oncologist--- manning   dx 11/ 2017  Gleason 3+3 active survillance;  bx 03-14-2020 Gleason 3+4   RA (rheumatoid arthritis) ([place])    rhemotologist--- dr Normajean Glasgow---     S/P CABG x 3 07/15/2018   LIMA to LAD;  SVG to PDA;  SVG to RI    Past Surgical History:  Procedure Laterality Date   CHOLECYSTECTOMY  08/29/2012   Procedure: LAPAROSCOPIC CHOLECYSTECTOMY WITH INTRAOPERATIVE CHOLANGIOGRAM;  Surgeon: Imogene Burn. Tsuei, MD;  [facility];  Service: General;  Laterality: N/A;   CORONARY ARTERY BYPASS GRAFT N/A 07/20/2018   Procedure: CORONARY ARTERY BYPASS GRAFTING (CABG) times three using left internal mammary artery to the LAD, and using endoscopically harvested right saphenous vein to PDA and intermedius.;  Surgeon: Grace Isaac, MD;  Location: [place];  Service: Open Heart Surgery;  Laterality: N/A;   LEFT HEART CATH AND CORONARY ANGIOGRAPHY N/A  07/15/2018   Procedure: LEFT HEART CATH AND CORONARY ANGIOGRAPHY;  Surgeon: Leonie Man, MD;  Location: [place] [facility];  Service: Cardiovascular;  Laterality: N/A;   RADIOACTIVE SEED IMPLANT N/A 06/21/2020   Procedure: RADIOACTIVE SEED IMPLANT/BRACHYTHERAPY IMPLANT;  Surgeon: Ardis Hughs, MD;  Location: [facility];  Service: Urology;  Laterality: N/A;   SHOULDER ARTHROSCOPY W/ ROTATOR CUFF REPAIR Right 09/03/2013   SHOULDER ARTHROSCOPY WITH SUBACROMIAL DECOMPRESSION, ROTATOR CUFF REPAIR AND BICEP TENDON REPAIR Left 09/03/2013   Procedure: LEFT SHOULDER ARTHROSCOPY WITH EXTENSIVE DEBRIDMENT, DISTAL CLAVICULECTOMY, ROTATOR CUFF REPAIR AND SUBACROMIAL DECOMPRESSION PARTIAL ACRIOMIOPLASTY WITH CORACOACROMIAL RELEASE;  Surgeon: Renette Butters, MD;  Location: [place];  Service: Orthopedics;  Laterality: Left;   SPACE OAR INSTILLATION N/A 06/21/2020   Procedure: SPACE OAR INSTILLATION;  Surgeon: Ardis Hughs, MD;  Location: [facility];  Service: Urology;  Laterality: N/A;   TEE WITHOUT CARDIOVERSION N/A 07/20/2018   Procedure: TRANSESOPHAGEAL ECHOCARDIOGRAM (TEE);  Surgeon: Grace Isaac, MD;  Location: [place];  Service: Open Heart Surgery;  Laterality: N/A;   TRANSURETHRAL RESECTION OF BLADDER TUMOR WITH MITOMYCIN-C  08/24/2008   '@[facility]'$      Current Outpatient Medications  Medication Sig Dispense Refill   allopurinol (ZYLOPRIM) 100 MG tablet Take 200 mg by mouth daily.      amLODipine (NORVASC) 10 MG tablet Take 1 tablet (10 mg total) by mouth daily. **Patient need a appointment for future refill** 90 tablet 1   aspirin EC 81 MG tablet Take 81 mg by mouth daily.     atorvastatin (LIPITOR) 40 MG tablet Take 1 tablet (40 mg total) by mouth daily. 90 tablet 3   Blood Glucose Monitoring Suppl (FREESTYLE FREEDOM LITE) w/Device KIT Use to check blood sugar three times daily. DX:E11.65 1 kit 0   Boswellia-Glucosamine-Vit D (OSTEO BI-FLEX ONE PER DAY PO) Take by mouth.     Cholecalciferol (VITAMIN D3 PO) Take by mouth daily.      CINNAMON PO Take by mouth.     cloNIDine (CATAPRES) 0.1 MG tablet Take 1 tablet (0.1 mg total) by mouth 2 (two) times daily. 60 tablet 11   folic acid (FOLVITE) 1 MG tablet Take 1 tablet (1 mg total) by mouth daily. 90 tablet 3   hydrALAZINE (APRESOLINE) 50 MG tablet TAKE 1 TABLET BY MOUTH THREE TIMES A DAY 270 tablet 3   insulin lispro (HUMALOG) 100 UNIT/ML injection Use max of 140 units daily via insulin pump. 130 mL 1   methotrexate (RHEUMATREX) 2.5 MG tablet Take by mouth.     metoprolol tartrate (LOPRESSOR) 50 MG tablet TAKE 1 TABLET BY MOUTH TWICE A DAY 180 tablet 1   Misc Natural Products (TART CHERRY ADVANCED PO) Take by mouth.     Multiple Vitamins-Minerals (CENTRUM SILVER PO) Take by mouth  daily.      sertraline (ZOLOFT) 25 MG tablet Take 25 mg by mouth daily. Takes 1 tablet twice a day     sildenafil (REVATIO) 20 MG tablet Take 20 mg by mouth 3 (three) times daily.     tamsulosin (FLOMAX) 0.4 MG CAPS capsule Take 1 capsule (0.4 mg total) by mouth daily. 30 capsule 5   tiZANidine (ZANAFLEX) 4 MG tablet Take 1 tablet (4 mg total) by mouth every 6 (six) hours as needed for muscle spasms. 30 tablet 0   traZODone (DESYREL) 50 MG tablet AS NEEDED ONLY     nitroGLYCERIN (NITROSTAT) 0.4 MG SL tablet Place 1  tablet (0.4 mg total) under the tongue every 5 (five) minutes as needed for chest pain. 30 tablet 1   No current facility-administered medications for this visit.    Allergies:   Nsaids    ROS:  Please see the history of present illness.   Otherwise, review of systems are positive for none.   All other systems are reviewed and negative.    PHYSICAL EXAM: VS:  BP (!) 160/70 (BP Location: Left Arm, Patient Position: Sitting, Cuff Size: Normal)    Pulse (!) 51    Resp 20    Ht $R'5\' 6"'Cj$  (1.676 m)    Wt 202 lb 3.2 oz (91.7 kg)    SpO2 98%    BMI 32.64 kg/m  , BMI Body mass index is 32.64 kg/m. GENERAL:  Well appearing NECK:  No jugular venous distention, waveform within normal limits, carotid upstroke brisk and symmetric, no bruits, no thyromegaly LUNGS:  Clear to auscultation bilaterally CHEST:  Unremarkable HEART:  PMI not displaced or sustained,S1 and S2 within normal limits, no S3, no S4, no clicks, no rubs, no murmurs ABD:  Flat, positive bowel sounds normal in frequency in pitch, no bruits, no rebound, no guarding, no midline pulsatile mass, no hepatomegaly, no splenomegaly EXT:  2 plus pulses throughout, no edema, no cyanosis no clubbing   EKG:  EKG is  ordered today. Sinus bradycardia, rate 51, axis within normal limits, intervals within normal limits, no acute ST-T wave changes.  Recent Labs: 05/25/2021: ALT 27; BUN 23; Creatinine, Ser 1.84; Potassium 3.9; Sodium 140     Lipid Panel    Component Value Date/Time   CHOL 126 05/25/2021 0958   CHOL 116 11/03/2020 0908   TRIG 137.0 05/25/2021 0958   HDL 30.20 (L) 05/25/2021 0958   HDL 33 (L) 11/03/2020 0908   CHOLHDL 4 05/25/2021 0958   VLDL 27.4 05/25/2021 0958   LDLCALC 69 05/25/2021 0958   LDLCALC 63 11/03/2020 0908      Wt Readings from Last 3 Encounters:  10/05/21 202 lb 3.2 oz (91.7 kg)  09/25/21 204 lb (92.5 kg)  05/25/21 191 lb 6.4 oz (86.8 kg)      Other studies Reviewed: Additional studies/ records that were reviewed today include: Labs Review of the above records demonstrates:  Please see elsewhere in the note.     ASSESSMENT AND PLAN:   SOB: The patient has increased shortness of breath I am going to start with a BNP level and an echocardiogram.  If these are normal I likely will do an ischemia evaluation given his symptoms are reminiscent of his previous anginal equivalent.  BRADYCARDIA: He is tolerating this.  No change in therapy.  CAD:   This will be evaluated as above.  HTN:  The blood pressure is at target.  No change in therapy.    HYPERLIPIDEMIA:   LDL was 69.  No change in therapy.   SLEEP APNEA:   He was ultimately told that he did not have sleep apnea.    DM:   A1C was 5.8.   He is going to be started on Farxiga which was ordered by his nephrologist and I agree with this.   CKD III:   Creat is he is followed by nephrology.  He thinks his creatinine was stable though I see the last 1 was 2.51 when he was on the previous of 1.69.  Again he follows with nephrology.     Current medicines are reviewed at length with the patient  today.  The patient does not have concerns regarding medicines.  The following changes have been made: As above  Labs/ tests ordered today include:     Orders Placed This Encounter  Procedures   B Nat Peptide   EKG 12-Lead   ECHOCARDIOGRAM COMPLETE     Disposition:   FU with me in one month. Ronnell Guadalajara, MD   10/06/2021 10:31 AM    [place]

## 2021-10-05 ENCOUNTER — Other Ambulatory Visit: Payer: Self-pay

## 2021-10-05 ENCOUNTER — Ambulatory Visit (INDEPENDENT_AMBULATORY_CARE_PROVIDER_SITE_OTHER): Payer: Medicare Other | Admitting: Cardiology

## 2021-10-05 ENCOUNTER — Encounter: Payer: Self-pay | Admitting: Cardiology

## 2021-10-05 VITALS — BP 160/70 | HR 51 | Resp 20 | Ht 66.0 in | Wt 202.2 lb

## 2021-10-05 DIAGNOSIS — Z951 Presence of aortocoronary bypass graft: Secondary | ICD-10-CM | POA: Diagnosis not present

## 2021-10-05 DIAGNOSIS — M7989 Other specified soft tissue disorders: Secondary | ICD-10-CM

## 2021-10-05 DIAGNOSIS — R001 Bradycardia, unspecified: Secondary | ICD-10-CM | POA: Diagnosis not present

## 2021-10-05 DIAGNOSIS — E785 Hyperlipidemia, unspecified: Secondary | ICD-10-CM

## 2021-10-05 DIAGNOSIS — I10 Essential (primary) hypertension: Secondary | ICD-10-CM

## 2021-10-05 DIAGNOSIS — R0602 Shortness of breath: Secondary | ICD-10-CM | POA: Diagnosis not present

## 2021-10-05 NOTE — Patient Instructions (Addendum)
Medication Instructions:  Continue same medications *If you need a refill on your cardiac medications before your next appointment, please call your pharmacy*   Lab Work: Have BNP in 1 month Lab order enclosed   Testing/Procedures: Schedule Echo in 1 month   Follow-Up: At [facility], you and your health needs are our priority.  As part of our continuing mission to provide you with exceptional heart care, we have created designated Provider Care Teams.  These Care Teams include your primary Cardiologist (physician) and Advanced Practice Providers (APPs -  Physician Assistants and Nurse Practitioners) who all work together to provide you with the care you need, when you need it.  We recommend signing up for the patient portal called "MyChart".  Sign up information is provided on this After Visit Summary.  MyChart is used to connect with patients for Virtual Visits (Telemedicine).  Patients are able to view lab/test results, encounter notes, upcoming appointments, etc.  Non-urgent messages can be sent to your provider as well.   To learn more about what you can do with MyChart, go to [email or web address].      Your next appointment:  Schedule appointment after echo    The format for your next appointment: Office   Provider: Dr.Hochrein

## 2021-10-06 ENCOUNTER — Encounter: Payer: Self-pay | Admitting: Cardiology

## 2021-10-08 ENCOUNTER — Other Ambulatory Visit: Payer: Self-pay

## 2021-10-08 DIAGNOSIS — R0602 Shortness of breath: Secondary | ICD-10-CM

## 2021-10-22 ENCOUNTER — Ambulatory Visit: Payer: Medicare Other | Admitting: Cardiology

## 2021-10-23 ENCOUNTER — Other Ambulatory Visit: Payer: Self-pay

## 2021-10-23 ENCOUNTER — Ambulatory Visit (HOSPITAL_COMMUNITY): Payer: Medicare Other | Attending: Cardiology

## 2021-10-23 ENCOUNTER — Other Ambulatory Visit: Payer: Medicare Other | Admitting: *Deleted

## 2021-10-23 DIAGNOSIS — E785 Hyperlipidemia, unspecified: Secondary | ICD-10-CM | POA: Diagnosis not present

## 2021-10-23 DIAGNOSIS — I10 Essential (primary) hypertension: Secondary | ICD-10-CM | POA: Diagnosis not present

## 2021-10-23 DIAGNOSIS — R0602 Shortness of breath: Secondary | ICD-10-CM

## 2021-10-23 DIAGNOSIS — Z951 Presence of aortocoronary bypass graft: Secondary | ICD-10-CM

## 2021-10-23 DIAGNOSIS — R001 Bradycardia, unspecified: Secondary | ICD-10-CM | POA: Diagnosis not present

## 2021-10-23 DIAGNOSIS — M7989 Other specified soft tissue disorders: Secondary | ICD-10-CM | POA: Diagnosis not present

## 2021-10-23 LAB — ECHOCARDIOGRAM COMPLETE
Area-P 1/2: 2.6 cm2
P 1/2 time: 801 msec
S' Lateral: 3.8 cm

## 2021-10-24 LAB — BRAIN NATRIURETIC PEPTIDE: BNP: 446.8 pg/mL — ABNORMAL HIGH (ref 0.0–100.0)

## 2021-10-25 NOTE — Progress Notes (Signed)
Cardiology Office Note   Date:  10/26/2021   ID:  Alec [name], DOB 11-13-1945, MRN [id number]  PCP:  Mayra Neer, MD  Cardiologist:   Minus Breeding, MD   Chief Complaint  Patient presents with   Shortness of Breath      History of Present Illness: Alec [name] is a 76 y.o. male who presents for follow up of CAD status post CABG.     At the last visit he had some shortness of breath.  BNP was very mildly elevated.  Echocardiogram demonstrated a preserved ejection fraction with some mild to moderate mitral regurgitation and suggestion of possible mild diastolic dysfunction.  He continues to get short of breath with activity such as walking a mile distance on level ground.  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  This is reminiscent of his previous angina.  He never really had chest pressure, neck or arm discomfort.  He is not having any new PND or orthopnea.  He has not had any new palpitations, presyncope or syncope.  He has had some mild lower extremity swelling.  This has been chronic   Past Medical History:  Diagnosis Date   Anxiety    Arthritis    Bilateral lower extremity edema    Chronic gout    06-19-2020  per pt last episode 6 months , great toe   CKD (chronic kidney disease), stage III ([place])    followed by pcp   Coronary artery disease cardiologist--- dr Baylee Mccorkel   a. nuclear study 04-25-2017 low risk b. ETT abnormal 07-07-2018  c.  cardiac cath 07-15-2018  severe multivessel disease   d.  CABG x3   Degenerative arthritis of shoulder region 08/2013   left   DOE (dyspnea on exertion)    History of bladder cancer 07/2008   s/p  TURBT   History of cellulitis 11/2017   left upper arm   History of kidney stones    Hyperlipidemia    Hypertension    followed by pcp/ cardiology   IDDM (insulin dependent diabetes mellitus)    endocrinologist--- dr Dwyane Dee---  pt uses insulin pump and dexcom  (06-19-2020 per pt fasting  sugar-- 98--110)   Insulin pump in place    OSA (obstructive sleep apnea)    Prostate cancer [facility]) urologist--- dr herrick/  oncologist--- manning   dx 11/ 2017  Gleason 3+3 active survillance;  bx 03-14-2020 Gleason 3+4   RA (rheumatoid arthritis) ([place])    rhemotologist--- dr Normajean Glasgow---     S/P CABG x 3 07/15/2018   LIMA to LAD;  SVG to PDA;  SVG to RI    Past Surgical History:  Procedure Laterality Date   CHOLECYSTECTOMY  08/29/2012   Procedure: LAPAROSCOPIC CHOLECYSTECTOMY WITH INTRAOPERATIVE CHOLANGIOGRAM;  Surgeon: Imogene Burn. Tsuei, MD;  [facility];  Service: General;  Laterality: N/A;   CORONARY ARTERY BYPASS GRAFT N/A 07/20/2018   Procedure: CORONARY ARTERY BYPASS GRAFTING (CABG) times three using left internal mammary artery to the LAD, and using endoscopically harvested right saphenous vein to PDA and intermedius.;  Surgeon: Grace Isaac, MD;  Location: [place];  Service: Open Heart Surgery;  Laterality: N/A;   LEFT HEART CATH AND CORONARY ANGIOGRAPHY N/A 07/15/2018   Procedure: LEFT HEART CATH AND CORONARY ANGIOGRAPHY;  Surgeon: Leonie Man, MD;  Location: [place] [facility];  Service: Cardiovascular;  Laterality: N/A;   RADIOACTIVE SEED IMPLANT N/A 06/21/2020  Procedure: RADIOACTIVE SEED IMPLANT/BRACHYTHERAPY IMPLANT;  Surgeon: Crist Fat, MD;  Location: [facility];  Service: Urology;  Laterality: N/A;   SHOULDER ARTHROSCOPY W/ ROTATOR CUFF REPAIR Right 09/03/2013   SHOULDER ARTHROSCOPY WITH SUBACROMIAL DECOMPRESSION, ROTATOR CUFF REPAIR AND BICEP TENDON REPAIR Left 09/03/2013   Procedure: LEFT SHOULDER ARTHROSCOPY WITH EXTENSIVE DEBRIDMENT, DISTAL CLAVICULECTOMY, ROTATOR CUFF REPAIR AND SUBACROMIAL DECOMPRESSION PARTIAL ACRIOMIOPLASTY WITH CORACOACROMIAL RELEASE;  Surgeon: Sheral Apley, MD;  Location: [facility];  Service: Orthopedics;  Laterality: Left;   SPACE OAR INSTILLATION N/A 06/21/2020   Procedure: SPACE OAR  INSTILLATION;  Surgeon: Crist Fat, MD;  Location[facility];  Service: Urology;  Laterality: N/A;   TEE WITHOUT CARDIOVERSION N/A 07/20/2018   Procedure: TRANSESOPHAGEAL ECHOCARDIOGRAM (TEE);  Surgeon: Delight Ovens, MD;  Location: [facility];  Service: Open Heart Surgery;  Laterality: N/A;   TRANSURETHRAL RESECTION OF BLADDER TUMOR WITH MITOMYCIN-C  08/24/2008   @[facility]      Current Outpatient Medications  Medication Sig Dispense Refill   allopurinol (ZYLOPRIM) 100 MG tablet Take 200 mg by mouth daily.      amLODipine (NORVASC) 10 MG tablet Take 1 tablet (10 mg total) by mouth daily. **Patient need a appointment for future refill** 90 tablet 1   aspirin EC 81 MG tablet Take 81 mg by mouth daily.     atorvastatin (LIPITOR) 40 MG tablet Take 1 tablet (40 mg total) by mouth daily. 90 tablet 3   Blood Glucose Monitoring Suppl (FREESTYLE FREEDOM LITE) w/Device KIT Use to check blood sugar three times daily. DX:E11.65 1 kit 0   Boswellia-Glucosamine-Vit D (OSTEO BI-FLEX ONE PER DAY PO) Take by mouth.     Cholecalciferol (VITAMIN D3 PO) Take by mouth daily.      CINNAMON PO Take by mouth.     cloNIDine (CATAPRES) 0.1 MG tablet Take 1 tablet (0.1 mg total) by mouth 2 (two) times daily. 60 tablet 11   dapagliflozin propanediol (FARXIGA) 5 MG TABS tablet Take 1 tablet (5 mg total) by mouth daily before breakfast. 90 tablet 3   hydrALAZINE (APRESOLINE) 50 MG tablet TAKE 1 TABLET BY MOUTH THREE TIMES A DAY 270 tablet 3   insulin lispro (HUMALOG) 100 UNIT/ML injection Use max of 140 units daily via insulin pump. 130 mL 1   isosorbide mononitrate (IMDUR) 60 MG 24 hr tablet Take 1 tablet (60 mg total) by mouth daily. 90 tablet 3   methotrexate (RHEUMATREX) 2.5 MG tablet Take by mouth.     metoprolol tartrate (LOPRESSOR) 50 MG tablet TAKE 1 TABLET BY MOUTH TWICE A DAY 180 tablet 1   Misc Natural Products (TART CHERRY ADVANCED PO) Take by mouth.     Multiple Vitamins-Minerals  (CENTRUM SILVER PO) Take by mouth daily.      sertraline (ZOLOFT) 25 MG tablet Take 25 mg by mouth daily. Takes 1 tablet twice a day     tamsulosin (FLOMAX) 0.4 MG CAPS capsule Take 1 capsule (0.4 mg total) by mouth daily. 30 capsule 5   nitroGLYCERIN (NITROSTAT) 0.4 MG SL tablet Place 1 tablet (0.4 mg total) under the tongue every 5 (five) minutes as needed for chest pain. 30 tablet 1   No current facility-administered medications for this visit.    Allergies:   Nsaids    ROS:  Please see the history of present illness.   Otherwise, review of systems are positive for none.   All other systems are reviewed and negative.  PHYSICAL EXAM: VS:  BP 134/60    Pulse (!) 54    Ht $R'5\' 6"'EW$  (1.676 m)    Wt 201 lb 6.4 oz (91.4 kg)    SpO2 97%    BMI 32.51 kg/m  , BMI Body mass index is 32.51 kg/m. GENERAL:  Well appearing NECK:  No jugular venous distention, waveform within normal limits, carotid upstroke brisk and symmetric, no bruits, no thyromegaly LUNGS:  Clear to auscultation bilaterally CHEST:  Well healed sternotomy scar. HEART:  PMI not displaced or sustained,S1 and S2 within normal limits, no S3, no S4, no clicks, no rubs, no murmurs ABD:  Flat, positive bowel sounds normal in frequency in pitch, no bruits, no rebound, no guarding, no midline pulsatile mass, no hepatomegaly, no splenomegaly EXT:  2 plus pulses throughout, mild leg edema, no cyanosis no clubbing   EKG:  EKG is  not ordered today.    Recent Labs: 05/25/2021: ALT 27; BUN 23; Creatinine, Ser 1.84; Potassium 3.9; Sodium 140 10/23/2021: BNP 446.8    Lipid Panel    Component Value Date/Time   CHOL 126 05/25/2021 0958   CHOL 116 11/03/2020 0908   TRIG 137.0 05/25/2021 0958   HDL 30.20 (L) 05/25/2021 0958   HDL 33 (L) 11/03/2020 0908   CHOLHDL 4 05/25/2021 0958   VLDL 27.4 05/25/2021 0958   LDLCALC 69 05/25/2021 0958   LDLCALC 63 11/03/2020 0908      Wt Readings from Last 3 Encounters:  10/26/21 201 lb 6.4 oz  (91.4 kg)  10/05/21 202 lb 3.2 oz (91.7 kg)  09/25/21 204 lb (92.5 kg)      Other studies Reviewed: Additional studies/ records that were reviewed today include: Labs, echo Review of the above records demonstrates:  Please see elsewhere in the note.     ASSESSMENT AND PLAN:   SOB:   I am worried that this could be an anginal equivalent.   I would like to eventually do a cardiac cath.  However, he has significantly elevated creatinine and will need to have him see his nephrologist for.  We will repeat labs today.   Of note he was started on Farxiga by Dr. Dwyane Dee but has not yet started it.  I think this is not unreasonable for treatment of diastolic heart failure although his creatinine clearance is too low for management of his diabetes.  His creatinine clearance most recently was 27.  This would allow [place] to start 5 mg of Farxiga for heart failure but I will have to watch his renal function closely.  BRADYCARDIA: He is tolerating this.  No change in therapy.  CAD:   I would like to evaluate this again with left heart catheterization as above.  However, this is pending his renal function.   HTN:  The blood pressure is not at target.  I am going to add isosorbide 60 mg daily.  We will have to sildenafil.  Not entirely clear why he was taking this.   HYPERLIPIDEMIA:   LDL was 69.  No change in therapy.   DM:   A1C was 5.8.    CKD III:   Creat is 2.51 but I am repeating his today.  I will repeat this again in another 10 days.   Current medicines are reviewed at length with the patient today.  The patient does not have concerns regarding medicines.  The following changes have been made: None  Labs/ tests ordered today include:     Orders Placed This Encounter  Procedures   CBC with Differential/Platelet   Basic metabolic panel   Basic metabolic panel     Disposition:   FU with me in one month. Ronnell Guadalajara, MD  10/26/2021 8:58 AM    [facility]  HeartCare

## 2021-10-26 ENCOUNTER — Other Ambulatory Visit: Payer: Self-pay

## 2021-10-26 ENCOUNTER — Ambulatory Visit (INDEPENDENT_AMBULATORY_CARE_PROVIDER_SITE_OTHER): Payer: Medicare Other | Admitting: Cardiology

## 2021-10-26 ENCOUNTER — Encounter: Payer: Self-pay | Admitting: Cardiology

## 2021-10-26 ENCOUNTER — Encounter: Payer: Self-pay | Admitting: *Deleted

## 2021-10-26 VITALS — BP 134/60 | HR 54 | Ht 66.0 in | Wt 201.4 lb

## 2021-10-26 DIAGNOSIS — R001 Bradycardia, unspecified: Secondary | ICD-10-CM

## 2021-10-26 DIAGNOSIS — R0602 Shortness of breath: Secondary | ICD-10-CM

## 2021-10-26 DIAGNOSIS — E118 Type 2 diabetes mellitus with unspecified complications: Secondary | ICD-10-CM

## 2021-10-26 DIAGNOSIS — N1831 Chronic kidney disease, stage 3a: Secondary | ICD-10-CM | POA: Diagnosis not present

## 2021-10-26 DIAGNOSIS — Z794 Long term (current) use of insulin: Secondary | ICD-10-CM

## 2021-10-26 DIAGNOSIS — I10 Essential (primary) hypertension: Secondary | ICD-10-CM | POA: Diagnosis not present

## 2021-10-26 DIAGNOSIS — Z5181 Encounter for therapeutic drug level monitoring: Secondary | ICD-10-CM | POA: Diagnosis not present

## 2021-10-26 DIAGNOSIS — G4730 Sleep apnea, unspecified: Secondary | ICD-10-CM

## 2021-10-26 LAB — CBC WITH DIFFERENTIAL/PLATELET
Basophils Absolute: 0.1 10*3/uL (ref 0.0–0.2)
Basos: 1 %
EOS (ABSOLUTE): 0.1 10*3/uL (ref 0.0–0.4)
Eos: 2 %
Hematocrit: 33.7 % — ABNORMAL LOW (ref 37.5–51.0)
Hemoglobin: 12.1 g/dL — ABNORMAL LOW (ref 13.0–17.7)
Immature Grans (Abs): 0 10*3/uL (ref 0.0–0.1)
Immature Granulocytes: 0 %
Lymphocytes Absolute: 1.5 10*3/uL (ref 0.7–3.1)
Lymphs: 30 %
MCH: 33.1 pg — ABNORMAL HIGH (ref 26.6–33.0)
MCHC: 35.9 g/dL — ABNORMAL HIGH (ref 31.5–35.7)
MCV: 92 fL (ref 79–97)
Monocytes Absolute: 0.4 10*3/uL (ref 0.1–0.9)
Monocytes: 9 %
Neutrophils Absolute: 2.9 10*3/uL (ref 1.4–7.0)
Neutrophils: 58 %
Platelets: 128 10*3/uL — ABNORMAL LOW (ref 150–450)
RBC: 3.66 x10E6/uL — ABNORMAL LOW (ref 4.14–5.80)
RDW: 13.6 % (ref 11.6–15.4)
WBC: 4.9 10*3/uL (ref 3.4–10.8)

## 2021-10-26 LAB — BASIC METABOLIC PANEL
BUN/Creatinine Ratio: 11 (ref 10–24)
BUN: 30 mg/dL — ABNORMAL HIGH (ref 8–27)
CO2: 23 mmol/L (ref 20–29)
Calcium: 9.3 mg/dL (ref 8.6–10.2)
Chloride: 111 mmol/L — ABNORMAL HIGH (ref 96–106)
Creatinine, Ser: 2.68 mg/dL — ABNORMAL HIGH (ref 0.76–1.27)
Glucose: 67 mg/dL — ABNORMAL LOW (ref 70–99)
Potassium: 4.7 mmol/L (ref 3.5–5.2)
Sodium: 144 mmol/L (ref 134–144)
eGFR: 24 mL/min/{1.73_m2} — ABNORMAL LOW (ref >59)

## 2021-10-26 MED ORDER — DAPAGLIFLOZIN PROPANEDIOL 5 MG PO TABS: 5.0000 mg | ORAL_TABLET | Freq: Every day | ORAL | 3 refills | Status: DC

## 2021-10-26 MED ORDER — NITROGLYCERIN 0.4 MG SL SUBL: 0.4000 mg | SUBLINGUAL_TABLET | SUBLINGUAL | 1 refills | Status: AC | PRN

## 2021-10-26 MED ORDER — ISOSORBIDE MONONITRATE ER 60 MG PO TB24: 60.0000 mg | ORAL_TABLET | Freq: Every day | ORAL | 3 refills | Status: DC

## 2021-10-26 NOTE — Patient Instructions (Addendum)
Medication Instructions:  START ISOSORBIDE 60 MG DAILY   STOP REVATIO (SILDENAFIL)   CONTINUE FARXIGA 5 MG DAILY, BRING PATIENT ASSISTANCE FORMS BACK WHEN YOU COMPLETE  *If you need a refill on your cardiac medications before your next appointment, please call your pharmacy*  Lab Work: CBC/BMET TODAY   If you have labs (blood work) drawn today and your tests are completely normal, you will receive your results only by: [place] (if you have MyChart) OR A paper copy in the mail If you have any lab test that is abnormal or we need to change your treatment, we will call you to review the results.   Testing/Procedures: NONE  Follow-Up: At [facility], you and your health needs are our priority.  As part of our continuing mission to provide you with exceptional heart care, we have created designated Provider Care Teams.  These Care Teams include your primary Cardiologist (physician) and Advanced Practice Providers (APPs -  Physician Assistants and Nurse Practitioners) who all work together to provide you with the care you need, when you need it.  We recommend signing up for the patient portal called "MyChart".  Sign up information is provided on this After Visit Summary.  MyChart is used to connect with patients for Virtual Visits (Telemedicine).  Patients are able to view lab/test results, encounter notes, upcoming appointments, etc.  Non-urgent messages can be sent to your provider as well.   To learn more about what you can do with MyChart, go to [email or web address].    Your next appointment:   6 week(s)  The format for your next appointment:   In Person  Provider:   Minus Breeding, MD AFTER YOU SEE [place]

## 2021-10-29 ENCOUNTER — Telehealth (HOSPITAL_BASED_OUTPATIENT_CLINIC_OR_DEPARTMENT_OTHER): Payer: Self-pay | Admitting: *Deleted

## 2021-10-29 NOTE — Telephone Encounter (Signed)
-----   Message from Minus Breeding, MD sent at 10/26/2021  5:48 PM EST ----- Creat is slightly worse than previous.  I will ask him not to start the Farxiga since the GFR has fallen further.  I called and spoke with him.  Send results to Mayra Neer, MD

## 2021-10-29 NOTE — Telephone Encounter (Signed)
Removed Farxiga from medication list and sent to Dr Brigitte Pulse

## 2021-10-30 ENCOUNTER — Telehealth: Payer: Self-pay

## 2021-10-30 NOTE — Telephone Encounter (Signed)
Patient called stating Cardiologist doesn't want him taking [place]. They are having to run more test on patient. Will not send in Patient Assistance application for [place]

## 2021-11-16 DIAGNOSIS — N1832 Chronic kidney disease, stage 3b: Secondary | ICD-10-CM | POA: Diagnosis not present

## 2021-11-20 ENCOUNTER — Telehealth: Payer: Self-pay

## 2021-11-20 ENCOUNTER — Other Ambulatory Visit: Payer: Self-pay | Admitting: Nephrology

## 2021-11-20 DIAGNOSIS — N179 Acute kidney failure, unspecified: Secondary | ICD-10-CM | POA: Diagnosis not present

## 2021-11-20 DIAGNOSIS — N1832 Chronic kidney disease, stage 3b: Secondary | ICD-10-CM | POA: Diagnosis not present

## 2021-11-20 DIAGNOSIS — I129 Hypertensive chronic kidney disease with stage 1 through stage 4 chronic kidney disease, or unspecified chronic kidney disease: Secondary | ICD-10-CM | POA: Diagnosis not present

## 2021-11-20 DIAGNOSIS — D631 Anemia in chronic kidney disease: Secondary | ICD-10-CM | POA: Diagnosis not present

## 2021-11-20 DIAGNOSIS — N2581 Secondary hyperparathyroidism of renal origin: Secondary | ICD-10-CM | POA: Diagnosis not present

## 2021-11-20 DIAGNOSIS — N189 Chronic kidney disease, unspecified: Secondary | ICD-10-CM | POA: Diagnosis not present

## 2021-11-20 NOTE — Telephone Encounter (Signed)
[facility] faxed requesting most recent chart notes. Electronically faxed to [facility]

## 2021-11-23 ENCOUNTER — Ambulatory Visit
Admission: RE | Admit: 2021-11-23 | Discharge: 2021-11-23 | Disposition: A | Discharge status: Home / Self Care | Payer: Medicare Other | Source: Ambulatory Visit | Attending: Nephrology | Admitting: Nephrology

## 2021-11-23 ENCOUNTER — Other Ambulatory Visit: Payer: Self-pay

## 2021-11-23 ENCOUNTER — Other Ambulatory Visit: Payer: Medicare Other

## 2021-11-23 DIAGNOSIS — N179 Acute kidney failure, unspecified: Secondary | ICD-10-CM

## 2021-11-23 DIAGNOSIS — R161 Splenomegaly, not elsewhere classified: Secondary | ICD-10-CM | POA: Diagnosis not present

## 2021-11-23 DIAGNOSIS — N281 Cyst of kidney, acquired: Secondary | ICD-10-CM | POA: Diagnosis not present

## 2021-11-23 DIAGNOSIS — N189 Chronic kidney disease, unspecified: Secondary | ICD-10-CM | POA: Diagnosis not present

## 2021-11-29 DIAGNOSIS — N179 Acute kidney failure, unspecified: Secondary | ICD-10-CM | POA: Diagnosis not present

## 2021-11-29 DIAGNOSIS — I129 Hypertensive chronic kidney disease with stage 1 through stage 4 chronic kidney disease, or unspecified chronic kidney disease: Secondary | ICD-10-CM | POA: Diagnosis not present

## 2021-11-29 DIAGNOSIS — N189 Chronic kidney disease, unspecified: Secondary | ICD-10-CM | POA: Diagnosis not present

## 2021-12-04 ENCOUNTER — Other Ambulatory Visit (HOSPITAL_COMMUNITY): Payer: Self-pay | Admitting: Nephrology

## 2021-12-04 DIAGNOSIS — N179 Acute kidney failure, unspecified: Secondary | ICD-10-CM

## 2021-12-04 DIAGNOSIS — N189 Chronic kidney disease, unspecified: Secondary | ICD-10-CM

## 2021-12-10 DIAGNOSIS — Z8546 Personal history of malignant neoplasm of prostate: Secondary | ICD-10-CM | POA: Diagnosis not present

## 2021-12-19 NOTE — Progress Notes (Signed)
?  ? ?Cardiology Office Note ? ? ?Date:  12/20/2021  ? ?ID:  RUFORD DUDZINSKI, DOB 08-20-1946, MRN [id number] ? ?PCP:  Mayra Neer, MD  ?Cardiologist:   Minus Breeding, MD ? ? ?Chief Complaint  ?Patient presents with  ? Abnormal ECG  ? ? ?  ?History of Present Illness: ?Alec [name] is a 76 y.o. male who presents for follow up of CAD status post CABG.     In Jan 2023 he has had shortness of breath.  BNP was very mildly elevated.  Echocardiogram demonstrated a preserved ejection fraction with some mild to moderate mitral regurgitation and suggestion of possible mild diastolic dysfunction. ? ?He is seeing a nephrologist and getting ready to have biopsy.  I have been evaluating him for shortness of breath and considering further testing such as a catheterization because initially he told me his symptoms were consistent with pre-bypass symptoms.  However, I did want to do this with his renal insufficiency and now coming back he is much more vague about his symptoms.  At first he said his breathing was getting better.  Then he said it was not.  In retrospect he really did not get better after his bypass.  I do not think there is a big decline in his symptoms.  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  However, this seems to be chronic.  He is not describing resting shortness of breath, PND or orthopnea.  Is not having any chest pressure, neck or arm discomfort.  He has had no weight gain or edema. ? ? ?Past Medical History:  ?Diagnosis Date  ? Anxiety   ? Arthritis   ? Bilateral lower extremity edema   ? Chronic gout   ? 06-19-2020  per pt last episode 6 months , great toe  ? CKD (chronic kidney disease), stage III ([place])   ? followed by pcp  ? Coronary artery disease cardiologist--- dr Viktor Philipp  ? a. nuclear study 04-25-2017 low risk b. ETT abnormal 07-07-2018  c.  cardiac cath 07-15-2018  severe multivessel disease   d.  CABG x3  ? Degenerative arthritis of shoulder  region 08/2013  ? left  ? DOE (dyspnea on exertion)   ? History of bladder cancer 07/2008  ? s/p  TURBT  ? History of cellulitis 11/2017  ? left upper arm  ? History of kidney stones   ? Hyperlipidemia   ? Hypertension   ? followed by pcp/ cardiology  ? IDDM (insulin dependent diabetes mellitus)   ? endocrinologist--- dr Dwyane Dee---  pt uses insulin pump and dexcom  (06-19-2020 per pt fasting sugar-- 98--110)  ? Insulin pump in place   ? OSA (obstructive sleep apnea)   ? Prostate cancer [facility]) urologist--- dr herrick/  oncologist--- [place]  ? dx 11/ 2017  Gleason 3+3 active survillance;  bx 03-14-2020 Gleason 3+4  ? RA (rheumatoid arthritis) ([place])   ? rhemotologist--- dr Normajean Glasgow---    ? S/P CABG x 3 07/15/2018  ? LIMA to LAD;  SVG to PDA;  SVG to RI  ? ? ?Past Surgical History:  ?Procedure Laterality Date  ? CHOLECYSTECTOMY  08/29/2012  ? Procedure: LAPAROSCOPIC CHOLECYSTECTOMY WITH INTRAOPERATIVE CHOLANGIOGRAM;  Surgeon: Imogene Burn. Georgette Dover, MD;  Location: [facility];  Service: General;  Laterality: N/A;  ? CORONARY ARTERY BYPASS GRAFT N/A 07/20/2018  ? Procedure: CORONARY ARTERY BYPASS GRAFTING (CABG) times three using left internal mammary artery to the LAD, and using  endoscopically harvested right saphenous vein to PDA and intermedius.;  Surgeon: Grace Isaac, MD;  Location: [place];  Service: Open Heart Surgery;  Laterality: N/A;  ? LEFT HEART CATH AND CORONARY ANGIOGRAPHY N/A 07/15/2018  ? Procedure: LEFT HEART CATH AND CORONARY ANGIOGRAPHY;  Surgeon: Leonie Man, MD;  Location: [place] [facility];  Service: Cardiovascular;  Laterality: N/A;  ? RADIOACTIVE SEED IMPLANT N/A 06/21/2020  ? Procedure: RADIOACTIVE SEED IMPLANT/BRACHYTHERAPY IMPLANT;  Surgeon: Ardis Hughs, MD;  Location: [facility];  Service: Urology;  Laterality: N/A;  ? SHOULDER ARTHROSCOPY W/ ROTATOR CUFF REPAIR Right 09/03/2013  ? SHOULDER ARTHROSCOPY WITH SUBACROMIAL DECOMPRESSION, ROTATOR CUFF REPAIR AND BICEP TENDON  REPAIR Left 09/03/2013  ? Procedure: LEFT SHOULDER ARTHROSCOPY WITH EXTENSIVE DEBRIDMENT, DISTAL CLAVICULECTOMY, ROTATOR CUFF REPAIR AND SUBACROMIAL DECOMPRESSION PARTIAL ACRIOMIOPLASTY WITH CORACOACROMIAL RELEASE;  Surgeon: Renette Butters, MD;  Location: [place];  Service: Orthopedics;  Laterality: Left;  ? SPACE OAR INSTILLATION N/A 06/21/2020  ? Procedure: SPACE OAR INSTILLATION;  Surgeon: Ardis Hughs, MD;  Location: [facility];  Service: Urology;  Laterality: N/A;  ? TEE WITHOUT CARDIOVERSION N/A 07/20/2018  ? Procedure: TRANSESOPHAGEAL ECHOCARDIOGRAM (TEE);  Surgeon: Grace Isaac, MD;  Location: [place];  Service: Open Heart Surgery;  Laterality: N/A;  ? TRANSURETHRAL RESECTION OF BLADDER TUMOR WITH MITOMYCIN-C  08/24/2008   _0   ? ? ? ?Current Outpatient Medications  ?Medication Sig Dispense Refill  ? allopurinol (ZYLOPRIM) 100 MG tablet Take 200 mg by mouth daily.     ? amLODipine (NORVASC) 10 MG tablet Take 1 tablet (10 mg total) by mouth daily. **Patient need a appointment for future refill** 90 tablet 1  ? aspirin EC 81 MG tablet Take 81 mg by mouth daily.    ? atorvastatin (LIPITOR) 40 MG tablet Take 1 tablet (40 mg total) by mouth daily. 90 tablet 3  ? Blood Glucose Monitoring Suppl (FREESTYLE FREEDOM LITE) w/Device KIT Use to check blood sugar three times daily. DX:E11.65 1 kit 0  ? [place]well[place]-Glucosamine-Vit D (OSTEO BI-FLEX ONE PER DAY PO) Take 1 tablet by mouth daily.    ? Cholecalciferol (VITAMIN D3 PO) Take 1 capsule by mouth daily.    ? CINNAMON PO Take 1 capsule by mouth daily.    ? cloNIDine (CATAPRES) 0.1 MG tablet Take 1 tablet (0.1 mg total) by mouth 2 (two) times daily. 60 tablet 11  ? hydrALAZINE (APRESOLINE) 50 MG tablet TAKE 1 TABLET BY MOUTH THREE TIMES A DAY 270 tablet 3  ? insulin lispro (HUMALOG) 100 UNIT/ML injection Use max of 140 units daily via insulin pump. (Patient taking differently: 85 Units See admin instructions. Use max of 85  units daily via insulin pump. Last three days) 130 mL 1  ? latanoprost (XALATAN) 0.005 % ophthalmic solution Place 1 drop into both eyes at bedtime.    ? metoprolol tartrate (LOPRESSOR) 50 MG tablet TAKE 1 TABLET BY MOUTH TWICE A DAY 180 tablet 1  ? Misc Natural Products (TART CHERRY ADVANCED PO) Take 1 tablet by mouth daily.    ? Multiple Vitamins-Minerals (CENTRUM SILVER PO) Take 1 tablet by mouth daily.    ? nitroGLYCERIN (NITROSTAT) 0.4 MG SL tablet Place 1 tablet (0.4 mg total) under the tongue every 5 (five) minutes as needed for chest pain. 30 tablet 1  ? sertraline (ZOLOFT) 100 MG tablet Take 100 mg by mouth daily.    ? tamsulosin (FLOMAX) 0.4 MG CAPS capsule Take 1 capsule (  0.4 mg total) by mouth daily. 30 capsule 5  ? isosorbide mononitrate (IMDUR) 120 MG 24 hr tablet Take 1 tablet (120 mg total) by mouth daily. 90 tablet 3  ? ?No current facility-administered medications for this visit.  ? ? ?Allergies:   Nsaids  ? ? ?ROS:  Please see the history of present illness.   Otherwise, review of systems are positive for none.   All other systems are reviewed and negative.  ? ? ?PHYSICAL EXAM: ?VS:  BP 138/60   Pulse 81   Ht _0  (1.676 m)   Wt 203 lb (92.1 kg)   SpO2 96%   BMI 32.77 kg/m?  , BMI Body mass index is 32.77 kg/m?. ?GENERAL:  Well appearing ?NECK:  No jugular venous distention, waveform within normal limits, carotid upstroke brisk and symmetric, no bruits, no thyromegaly ?LUNGS:  Clear to auscultation bilaterally ?CHEST:  Well healed sternotomy scar. ?HEART:  PMI not displaced or sustained,S1 and S2 within normal limits, no S3, no S4, no clicks, no rubs, no murmurs ?ABD:  Flat, positive bowel sounds normal in frequency in pitch, no bruits, no rebound, no guarding, no midline pulsatile mass, no hepatomegaly, no splenomegaly ?EXT:  2 plus pulses throughout, mild bilateral leg edema, no cyanosis no clubbing ? ? ?EKG:  EKG is  not ordered today. ?NA ? ? ?Recent Labs: ?05/25/2021: ALT 27 ?10/23/2021:  BNP 446.8 ?10/26/2021: BUN 30; Creatinine, Ser 2.68; Hemoglobin 12.1; Platelets 128; Potassium 4.7; Sodium 144  ? ? ?Lipid Panel ?   ?Component Value Date/Time  ? CHOL 126 05/25/2021 0958  ? CHOL 116 02/04/2

## 2021-12-20 ENCOUNTER — Other Ambulatory Visit: Payer: Self-pay | Admitting: Student

## 2021-12-20 ENCOUNTER — Other Ambulatory Visit: Payer: Self-pay

## 2021-12-20 ENCOUNTER — Other Ambulatory Visit: Payer: Self-pay | Admitting: Radiology

## 2021-12-20 ENCOUNTER — Encounter: Payer: Self-pay | Admitting: Cardiology

## 2021-12-20 ENCOUNTER — Ambulatory Visit (INDEPENDENT_AMBULATORY_CARE_PROVIDER_SITE_OTHER): Payer: Medicare Other | Admitting: Cardiology

## 2021-12-20 VITALS — BP 138/60 | HR 81 | Ht 66.0 in | Wt 203.0 lb

## 2021-12-20 DIAGNOSIS — I2510 Atherosclerotic heart disease of native coronary artery without angina pectoris: Secondary | ICD-10-CM | POA: Diagnosis not present

## 2021-12-20 DIAGNOSIS — N1831 Chronic kidney disease, stage 3a: Secondary | ICD-10-CM

## 2021-12-20 DIAGNOSIS — E118 Type 2 diabetes mellitus with unspecified complications: Secondary | ICD-10-CM

## 2021-12-20 DIAGNOSIS — R0602 Shortness of breath: Secondary | ICD-10-CM | POA: Diagnosis not present

## 2021-12-20 DIAGNOSIS — E785 Hyperlipidemia, unspecified: Secondary | ICD-10-CM

## 2021-12-20 DIAGNOSIS — I10 Essential (primary) hypertension: Secondary | ICD-10-CM

## 2021-12-20 MED ORDER — ISOSORBIDE MONONITRATE ER 120 MG PO TB24: 120.0000 mg | ORAL_TABLET | Freq: Every day | ORAL | 3 refills | Status: DC

## 2021-12-20 NOTE — Patient Instructions (Signed)
Medication Instructions:  ?INCREASE isosorbide (Imdur) to 120 mg daily ? ?*If you need a refill on your cardiac medications before your next appointment, please call your pharmacy* ? ?Follow-Up: ?At [facility], you and your health needs are our priority.  As part of our continuing mission to provide you with exceptional heart care, we have created designated Provider Care Teams.  These Care Teams include your primary Cardiologist (physician) and Advanced Practice Providers (APPs -  Physician Assistants and Nurse Practitioners) who all work together to provide you with the care you need, when you need it. ? ?We recommend signing up for the patient portal called "MyChart".  Sign up information is provided on this After Visit Summary.  MyChart is used to connect with patients for Virtual Visits (Telemedicine).  Patients are able to view lab/test results, encounter notes, upcoming appointments, etc.  Non-urgent messages can be sent to your provider as well.   ?To learn more about what you can do with MyChart, go to [email or web address].   ? ?Your next appointment:   ?4 month(s) with PA/NP ? ? ? ?

## 2021-12-21 ENCOUNTER — Encounter (HOSPITAL_COMMUNITY): Payer: Self-pay

## 2021-12-21 ENCOUNTER — Ambulatory Visit (HOSPITAL_COMMUNITY)
Admission: RE | Admit: 2021-12-21 | Discharge: 2021-12-21 | Disposition: A | Discharge status: Home / Self Care | Payer: Medicare Other | Source: Ambulatory Visit | Attending: Nephrology | Admitting: Nephrology

## 2021-12-21 DIAGNOSIS — N179 Acute kidney failure, unspecified: Secondary | ICD-10-CM | POA: Insufficient documentation

## 2021-12-21 DIAGNOSIS — N189 Chronic kidney disease, unspecified: Secondary | ICD-10-CM | POA: Insufficient documentation

## 2021-12-21 DIAGNOSIS — E119 Type 2 diabetes mellitus without complications: Secondary | ICD-10-CM | POA: Insufficient documentation

## 2021-12-21 LAB — GLUCOSE, CAPILLARY
Glucose-Capillary: 84 mg/dL (ref 70–99)
Glucose-Capillary: 62 mg/dL — ABNORMAL LOW (ref 70–99)
Glucose-Capillary: 153 mg/dL — ABNORMAL HIGH (ref 70–99)

## 2021-12-21 LAB — CBC
HCT: 33 % — ABNORMAL LOW (ref 39.0–52.0)
Hemoglobin: 11.5 g/dL — ABNORMAL LOW (ref 13.0–17.0)
MCH: 32.3 pg (ref 26.0–34.0)
MCHC: 34.8 g/dL (ref 30.0–36.0)
MCV: 92.7 fL (ref 80.0–100.0)
Platelets: 123 10*3/uL — ABNORMAL LOW (ref 150–400)
RBC: 3.56 MIL/uL — ABNORMAL LOW (ref 4.22–5.81)
RDW: 13.7 % (ref 11.5–15.5)
WBC: 3.3 10*3/uL — ABNORMAL LOW (ref 4.0–10.5)
nRBC: 0 % (ref 0.0–0.2)

## 2021-12-21 LAB — PROTIME-INR
INR: 1 (ref 0.8–1.2)
Prothrombin Time: 13.4 seconds (ref 11.4–15.2)

## 2021-12-21 MED ORDER — FENTANYL CITRATE (PF) 100 MCG/2ML IJ SOLN
INTRAMUSCULAR | Status: AC | PRN
  Administered 2021-12-21: 50 ug via INTRAVENOUS

## 2021-12-21 MED ORDER — LIDOCAINE HCL (PF) 1 % IJ SOLN
INTRAMUSCULAR | Status: AC
  Filled 2021-12-21: qty 30

## 2021-12-21 MED ORDER — MIDAZOLAM HCL 2 MG/2ML IJ SOLN
INTRAMUSCULAR | Status: AC | PRN
  Administered 2021-12-21: 1 mg via INTRAVENOUS
  Administered 2021-12-21: .5 mg via INTRAVENOUS

## 2021-12-21 MED ORDER — MIDAZOLAM HCL 2 MG/2ML IJ SOLN
INTRAMUSCULAR | Status: AC
  Filled 2021-12-21: qty 2

## 2021-12-21 MED ORDER — SODIUM CHLORIDE 0.9 % IV SOLN: INTRAVENOUS | Status: DC

## 2021-12-21 MED ORDER — FENTANYL CITRATE (PF) 100 MCG/2ML IJ SOLN
INTRAMUSCULAR | Status: AC
  Filled 2021-12-21: qty 2

## 2021-12-21 MED ORDER — LIDOCAINE-EPINEPHRINE 1 %-1:100000 IJ SOLN
INTRAMUSCULAR | Status: AC
  Filled 2021-12-21: qty 1

## 2021-12-21 NOTE — Procedures (Signed)
Pre Procedure Dx: Proteinuria Post Procedural Dx: Same  Technically successful US guided biopsy of inferior pole of the left kidney.  EBL: None  No immediate complications.   Jay Stella Bortle, MD Pager #: [phone]   

## 2021-12-21 NOTE — Consult Note (Signed)
? ?Chief Complaint: ?Patient was seen in consultation today for acute kidney injury superimposed on CKD at the request of Alec [name] ? ?Referring Physician(s): ?Alec [name] ? ?Supervising Physician: Sandi Mariscal ? ?Patient Status: Alec [name] - Out-pt ? ?History of Present Illness: ?Alec [name] is a 76 y.o. male with history of DM, CAD, CKD, bladder cancer, nephrolithiasis, and HTN has experienced recent AKI on CKD.  He presents to IR at the request of Nephrology for a random renal biopsy.   ? ?[name] reports feeling well overall but notes a mild headache this morning.  No other complaints. ? ?Past Medical History:  ?Diagnosis Date  ? Anxiety   ? Arthritis   ? Bilateral lower extremity edema   ? Chronic gout   ? 06-19-2020  per pt last episode 6 months , great toe  ? CKD (chronic kidney disease), stage III ([place])   ? followed by pcp  ? Coronary artery disease cardiologist--- dr hochrein  ? a. nuclear study 04-25-2017 low risk b. ETT abnormal 07-07-2018  c.  cardiac cath 07-15-2018  severe multivessel disease   d.  CABG x3  ? Degenerative arthritis of shoulder region 08/2013  ? left  ? DOE (dyspnea on exertion)   ? History of bladder cancer 07/2008  ? s/p  TURBT  ? History of cellulitis 11/2017  ? left upper arm  ? History of kidney stones   ? Hyperlipidemia   ? Hypertension   ? followed by pcp/ cardiology  ? IDDM (insulin dependent diabetes mellitus)   ? endocrinologist--- dr Dwyane Dee---  pt uses insulin pump and dexcom  (06-19-2020 per pt fasting sugar-- 98--110)  ? Insulin pump in place   ? OSA (obstructive sleep apnea)   ? Prostate cancer [facility]) urologist--- dr herrick/  oncologist--- [place]  ? dx 11/ 2017  Gleason 3+3 active survillance;  bx 03-14-2020 Gleason 3+4  ? RA (rheumatoid arthritis) ([place])   ? rhemotologist--- dr Normajean Glasgow---    ? S/P CABG x 3 07/15/2018  ? LIMA to LAD;  SVG to PDA;  SVG to RI  ? ? ?Past Surgical History:  ?Procedure Laterality Date  ? CHOLECYSTECTOMY  08/29/2012  ? Procedure: LAPAROSCOPIC  CHOLECYSTECTOMY WITH INTRAOPERATIVE CHOLANGIOGRAM;  Surgeon: Imogene Burn. Georgette Dover, MD;  Location: [facility];  Service: General;  Laterality: N/A;  ? CORONARY ARTERY BYPASS GRAFT N/A 07/20/2018  ? Procedure: CORONARY ARTERY BYPASS GRAFTING (CABG) times three using left internal mammary artery to the LAD, and using endoscopically harvested right saphenous vein to PDA and intermedius.;  Surgeon: Grace Isaac, MD;  Location: [place];  Service: Open Heart Surgery;  Laterality: N/A;  ? LEFT HEART CATH AND CORONARY ANGIOGRAPHY N/A 07/15/2018  ? Procedure: LEFT HEART CATH AND CORONARY ANGIOGRAPHY;  Surgeon: Leonie Man, MD;  Location: [place] [facility];  Service: Cardiovascular;  Laterality: N/A;  ? RADIOACTIVE SEED IMPLANT N/A 06/21/2020  ? Procedure: RADIOACTIVE SEED IMPLANT/BRACHYTHERAPY IMPLANT;  Surgeon: Ardis Hughs, MD;  Location: [facility];  Service: Urology;  Laterality: N/A;  ? SHOULDER ARTHROSCOPY W/ ROTATOR CUFF REPAIR Right 09/03/2013  ? SHOULDER ARTHROSCOPY WITH SUBACROMIAL DECOMPRESSION, ROTATOR CUFF REPAIR AND BICEP TENDON REPAIR Left 09/03/2013  ? Procedure: LEFT SHOULDER ARTHROSCOPY WITH EXTENSIVE DEBRIDMENT, DISTAL CLAVICULECTOMY, ROTATOR CUFF REPAIR AND SUBACROMIAL DECOMPRESSION PARTIAL ACRIOMIOPLASTY WITH CORACOACROMIAL RELEASE;  Surgeon: Renette Butters, MD;  Location: [place];  Service: Orthopedics;  Laterality: Left;  ? SPACE OAR INSTILLATION N/A 06/21/2020  ? Procedure: SPACE OAR INSTILLATION;  Surgeon:  Ardis Hughs, MD;  Location: [facility];  Service: Urology;  Laterality: N/A;  ? TEE WITHOUT CARDIOVERSION N/A 07/20/2018  ? Procedure: TRANSESOPHAGEAL ECHOCARDIOGRAM (TEE);  Surgeon: Grace Isaac, MD;  Location: [place];  Service: Open Heart Surgery;  Laterality: N/A;  ? TRANSURETHRAL RESECTION OF BLADDER TUMOR WITH MITOMYCIN-C  08/24/2008   '@[facility]'$   ? ? ?Allergies: ?Nsaids ? ?Medications: ?Prior to Admission medications    ?Medication Sig Start Date End Date Taking? Authorizing Provider  ?allopurinol (ZYLOPRIM) 100 MG tablet Take 200 mg by mouth daily.    Yes [provider]  ?amLODipine (NORVASC) 10 MG tablet Take 1 tablet (10 mg total) by mouth daily. **Patient need a appointment for future refill** 09/18/21  Yes Minus Breeding, MD  ?aspirin EC 81 MG tablet Take 81 mg by mouth daily.   Yes [provider]  ?atorvastatin (LIPITOR) 40 MG tablet Take 1 tablet (40 mg total) by mouth daily. 11/03/20  Yes Lendon Colonel, NP  ?Boswellia-Glucosamine-Vit D (OSTEO BI-FLEX ONE PER DAY PO) Take 1 tablet by mouth daily.   Yes [provider]  ?Cholecalciferol (VITAMIN D3 PO) Take 1 capsule by mouth daily.   Yes [provider]  ?CINNAMON PO Take 1 capsule by mouth daily.   Yes [provider]  ?cloNIDine (CATAPRES) 0.1 MG tablet Take 1 tablet (0.1 mg total) by mouth 2 (two) times daily. 02/28/21  Yes Minus Breeding, MD  ?hydrALAZINE (APRESOLINE) 50 MG tablet TAKE 1 TABLET BY MOUTH THREE TIMES A DAY 02/21/21  Yes Minus Breeding, MD  ?insulin lispro (HUMALOG) 100 UNIT/ML injection Use max of 140 units daily via insulin pump. ?Patient taking differently: 85 Units See admin instructions. Use max of 85 units daily via insulin pump. Last three days 09/27/21  Yes Elayne Snare, MD  ?isosorbide mononitrate (IMDUR) 120 MG 24 hr tablet Take 1 tablet (120 mg total) by mouth daily. 12/20/21 12/15/22 Yes Minus Breeding, MD  ?latanoprost (XALATAN) 0.005 % ophthalmic solution Place 1 drop into both eyes at bedtime. 09/18/21  Yes [provider]  ?metoprolol tartrate (LOPRESSOR) 50 MG tablet TAKE 1 TABLET BY MOUTH TWICE A DAY 06/11/21  Yes Minus Breeding, MD  ?Misc Natural Products (TART CHERRY ADVANCED PO) Take 1 tablet by mouth daily.   Yes [provider]  ?Multiple Vitamins-Minerals (CENTRUM SILVER PO) Take 1 tablet by mouth daily.   Yes [provider]  ?nitroGLYCERIN (NITROSTAT) 0.4  MG SL tablet Place 1 tablet (0.4 mg total) under the tongue every 5 (five) minutes as needed for chest pain. 10/26/21 01/24/22 Yes Minus Breeding, MD  ?sertraline (ZOLOFT) 100 MG tablet Take 100 mg by mouth daily. 02/25/20  Yes [provider]  ?tamsulosin (FLOMAX) 0.4 MG CAPS capsule Take 1 capsule (0.4 mg total) by mouth daily. 06/21/20  Yes Ardis Hughs, MD  ?Blood Glucose Monitoring Suppl (FREESTYLE FREEDOM LITE) w/Device KIT Use to check blood sugar three times daily. DX:E11.65 02/02/20   Elayne Snare, MD  ?  ? ?Family History  ?Problem Relation Age of Onset  ? Diabetes Mother   ? Lung cancer Mother   ? Cancer Father   ? Coronary artery disease Father   ?     MI at age 48.   ? Lung cancer Father   ? Sudden death Son   ? CAD Maternal Grandfather   ?     MI in his 28's  ? CAD Paternal Grandfather   ? Breast cancer Neg Hx   ?  Colon cancer Neg Hx   ? Pancreatic cancer Neg Hx   ? ? ?Social History  ? ?Socioeconomic History  ? Marital status: Married  ?  Spouse name: Not on file  ? Number of children: Not on file  ? Years of education: Not on file  ? Highest education level: Not on file  ?Occupational History  ? Not on file  ?Tobacco Use  ? Smoking status: Former  ?  Packs/day: 2.00  ?  Years: 25.00  ?  Pack years: 50.00  ?  Types: Cigarettes  ?  Quit date: 09/11/1997  ?  Years since quitting: 24.2  ? Smokeless tobacco: Never  ?Vaping Use  ? Vaping Use: Never used  ?Substance and Sexual Activity  ? Alcohol use: Not Currently  ? Drug use: Never  ? Sexual activity: Yes  ?Other Topics Concern  ? Not on file  ?Social History Narrative  ? Not on file  ? ?Social Determinants of Health  ? ?Financial Resource Strain: Not on file  ?Food Insecurity: Not on file  ?Transportation Needs: Not on file  ?Physical Activity: Not on file  ?Stress: Not on file  ?Social Connections: Not on file  ? ? ?Review of Systems: A 12 point ROS discussed and pertinent positives are indicated in the HPI above.  All other systems are  negative. ? ? ?Vital Signs: ?BP (!) 154/63   Pulse (!) 46   Temp 97.6 ?F (36.4 ?C) (Oral)   Resp 18   Ht $R'5\' 6"'Hj$  (1.676 m)   Wt 201 lb (91.2 kg)   SpO2 98%   BMI 32.44 kg/m?  ? ?Physical Exam ?Vitals reviewed.  ?Consti

## 2021-12-25 ENCOUNTER — Encounter (HOSPITAL_COMMUNITY): Payer: Self-pay

## 2021-12-25 LAB — SURGICAL PATHOLOGY

## 2021-12-28 DIAGNOSIS — N184 Chronic kidney disease, stage 4 (severe): Secondary | ICD-10-CM | POA: Diagnosis not present

## 2021-12-28 DIAGNOSIS — I129 Hypertensive chronic kidney disease with stage 1 through stage 4 chronic kidney disease, or unspecified chronic kidney disease: Secondary | ICD-10-CM | POA: Diagnosis not present

## 2022-01-03 ENCOUNTER — Other Ambulatory Visit: Payer: Self-pay

## 2022-01-03 ENCOUNTER — Other Ambulatory Visit (HOSPITAL_COMMUNITY): Payer: Self-pay

## 2022-01-03 ENCOUNTER — Telehealth: Payer: Self-pay | Admitting: Pharmacy Technician

## 2022-01-03 DIAGNOSIS — E1165 Type 2 diabetes mellitus with hyperglycemia: Secondary | ICD-10-CM

## 2022-01-03 MED ORDER — INSULIN ASPART 100 UNIT/ML IJ SOLN: INTRAMUSCULAR | 2 refills | Status: DC

## 2022-01-03 NOTE — Telephone Encounter (Signed)
Rx sent 

## 2022-01-10 DIAGNOSIS — N184 Chronic kidney disease, stage 4 (severe): Secondary | ICD-10-CM | POA: Diagnosis not present

## 2022-01-24 DIAGNOSIS — D492 Neoplasm of unspecified behavior of bone, soft tissue, and skin: Secondary | ICD-10-CM | POA: Diagnosis not present

## 2022-01-24 DIAGNOSIS — L821 Other seborrheic keratosis: Secondary | ICD-10-CM | POA: Diagnosis not present

## 2022-01-24 DIAGNOSIS — L814 Other melanin hyperpigmentation: Secondary | ICD-10-CM | POA: Diagnosis not present

## 2022-01-24 DIAGNOSIS — L570 Actinic keratosis: Secondary | ICD-10-CM | POA: Diagnosis not present

## 2022-01-24 DIAGNOSIS — D225 Melanocytic nevi of trunk: Secondary | ICD-10-CM | POA: Diagnosis not present

## 2022-01-25 ENCOUNTER — Other Ambulatory Visit (INDEPENDENT_AMBULATORY_CARE_PROVIDER_SITE_OTHER): Payer: Medicare Other

## 2022-01-25 DIAGNOSIS — Z794 Long term (current) use of insulin: Secondary | ICD-10-CM

## 2022-01-25 DIAGNOSIS — E1165 Type 2 diabetes mellitus with hyperglycemia: Secondary | ICD-10-CM

## 2022-01-25 LAB — BASIC METABOLIC PANEL
BUN: 34 mg/dL — ABNORMAL HIGH (ref 6–23)
CO2: 21 mEq/L (ref 19–32)
Calcium: 8.9 mg/dL (ref 8.4–10.5)
Chloride: 109 mEq/L (ref 96–112)
Creatinine, Ser: 2.86 mg/dL — ABNORMAL HIGH (ref 0.40–1.50)
GFR: 21.15 mL/min — ABNORMAL LOW (ref >60.00)
Glucose, Bld: 182 mg/dL — ABNORMAL HIGH (ref 70–99)
Potassium: 4.2 mEq/L (ref 3.5–5.1)
Sodium: 139 mEq/L (ref 135–145)

## 2022-01-25 LAB — HEMOGLOBIN A1C: Hgb A1c MFr Bld: 5.6 % (ref 4.6–6.5)

## 2022-01-28 ENCOUNTER — Other Ambulatory Visit: Payer: Medicare Other

## 2022-02-01 ENCOUNTER — Ambulatory Visit: Payer: Medicare Other | Admitting: Endocrinology

## 2022-02-04 ENCOUNTER — Encounter: Payer: Self-pay | Admitting: Endocrinology

## 2022-02-04 ENCOUNTER — Encounter: Payer: Medicare Other | Admitting: Endocrinology

## 2022-02-04 NOTE — Progress Notes (Signed)
This encounter was created in error - please disregard.

## 2022-02-11 ENCOUNTER — Ambulatory Visit
Admission: EM | Admit: 2022-02-11 | Discharge: 2022-02-11 | Disposition: A | Discharge status: Home / Self Care | Payer: Medicare Other | Attending: Family Medicine | Admitting: Family Medicine

## 2022-02-11 ENCOUNTER — Ambulatory Visit (INDEPENDENT_AMBULATORY_CARE_PROVIDER_SITE_OTHER): Payer: Medicare Other

## 2022-02-11 DIAGNOSIS — M25552 Pain in left hip: Secondary | ICD-10-CM

## 2022-02-11 DIAGNOSIS — N184 Chronic kidney disease, stage 4 (severe): Secondary | ICD-10-CM | POA: Diagnosis not present

## 2022-02-11 MED ORDER — TRAMADOL HCL 50 MG PO TABS: 50.0000 mg | ORAL_TABLET | Freq: Two times a day (BID) | ORAL | 0 refills | Status: DC | PRN

## 2022-02-11 NOTE — ED Triage Notes (Signed)
5 days ago, Pt tripped over his dog and landed on his left side causing an onset of left hip pain. No meds taken. Notes pain with ambulation. No swelling or bruising. No pain or n/t in BLE. Sitting alleviates sxs. ?

## 2022-02-11 NOTE — Discharge Instructions (Addendum)
The xray did not show any broken bones ? ?Tramadol 50 mg--1 every 12 hours as needed for pain. This is a mild narcotic. It can cause dizziness or sleepiness. ? ?You can also take tylenol 500 mg--2 every 6-8 hours as needed for pain. ?

## 2022-02-11 NOTE — ED Provider Notes (Signed)
?[facility] ? ? ? ?CSN: [id number] ?Arrival date & time: 02/11/22  1238 ? ? ?  ? ?History   ?Chief Complaint ?Chief Complaint  ?Patient presents with  ? Hip Pain  ?  left  ? ? ?HPI ?Alec [name] is a 76 y.o. male.  ? ? ?Hip Pain ? ?Here for left lateral hip pain.  ? ?On May 10, he fell over his [place] dog, and fell onto his left side/hip. Since then, it hurts to bear weight or arise from sitting position. ? ?PMH: CKD, gout, RZ, DM, htn/CAD.  ? ?Past Medical History:  ?Diagnosis Date  ? Anxiety   ? Arthritis   ? Bilateral lower extremity edema   ? Chronic gout   ? 06-19-2020  per pt last episode 6 months , great toe  ? CKD (chronic kidney disease), stage III ([place])   ? followed by pcp  ? Coronary artery disease cardiologist--- dr hochrein  ? a. nuclear study 04-25-2017 low risk b. ETT abnormal 07-07-2018  c.  cardiac cath 07-15-2018  severe multivessel disease   d.  CABG x3  ? Degenerative arthritis of shoulder region 08/2013  ? left  ? DOE (dyspnea on exertion)   ? History of bladder cancer 07/2008  ? s/p  TURBT  ? History of cellulitis 11/2017  ? left upper arm  ? History of kidney stones   ? Hyperlipidemia   ? Hypertension   ? followed by pcp/ cardiology  ? IDDM (insulin dependent diabetes mellitus)   ? endocrinologist--- dr Dwyane Dee---  pt uses insulin pump and dexcom  (06-19-2020 per pt fasting sugar-- 98--110)  ? Insulin pump in place   ? OSA (obstructive sleep apnea)   ? Prostate cancer [facility]) urologist--- dr herrick/  oncologist--- manning  ? dx 11/ 2017  Gleason 3+3 active survillance;  bx 03-14-2020 Gleason 3+4  ? RA (rheumatoid arthritis) ([place])   ? rhemotologist--- dr Normajean Glasgow---    ? S/P CABG x 3 07/15/2018  ? LIMA to LAD;  SVG to PDA;  SVG to RI  ? ? ?Patient Active Problem List  ? Diagnosis Date Noted  ? Snoring 07/18/2020  ? Excessive sleepiness 07/18/2020  ? Witnessed episode of apnea 07/18/2020  ? Leg swelling 04/30/2020  ? Bradycardia 04/30/2020  ? Malignant neoplasm of  prostate ([place]) 04/18/2020  ? Educated about COVID-19 virus infection 10/26/2019  ? Rheumatoid arthritis of multiple sites with negative rheumatoid factor ([place]) 09/06/2019  ? SOB (shortness of breath) 11/10/2018  ? Cough 11/10/2018  ? Wheezing 11/10/2018  ? Coronary artery disease involving native coronary artery of native heart without angina pectoris 11/10/2018  ? Dyslipidemia 11/10/2018  ? CRI (chronic renal insufficiency), stage 3 (moderate) ([place]) 08/06/2018  ? S/P CABG x 3 07/20/2018  ? Angina, class II ([place]) 07/14/2018  ? Abnormal stress electrocardiography using treadmill 07/14/2018  ? Cellulitis of left arm 12/10/2017  ? Cellulitis 12/09/2017  ? Diabetic nephropathy associated with type 2 diabetes mellitus ([place]) 12/14/2014  ? Essential hypertension 12/14/2014  ? IDDM (insulin dependent diabetes mellitus) 08/29/2012  ? Dyslipidemia, goal LDL below 70 08/29/2012  ? Abdominal pain 08/29/2012  ? ? ?Past Surgical History:  ?Procedure Laterality Date  ? CHOLECYSTECTOMY  08/29/2012  ? Procedure: LAPAROSCOPIC CHOLECYSTECTOMY WITH INTRAOPERATIVE CHOLANGIOGRAM;  Surgeon: Imogene Burn. Georgette Dover, MD;  [facility];  Service: General;  Laterality: N/A;  ? CORONARY ARTERY BYPASS GRAFT N/A 07/20/2018  ? Procedure: CORONARY ARTERY BYPASS GRAFTING (CABG) times three using left internal mammary  artery to the LAD, and using endoscopically harvested right saphenous vein to PDA and intermedius.;  Surgeon: Grace Isaac, MD;  Location: [place];  Service: Open Heart Surgery;  Laterality: N/A;  ? LEFT HEART CATH AND CORONARY ANGIOGRAPHY N/A 07/15/2018  ? Procedure: LEFT HEART CATH AND CORONARY ANGIOGRAPHY;  Surgeon: Leonie Man, MD;  Location: [place] [facility];  Service: Cardiovascular;  Laterality: N/A;  ? RADIOACTIVE SEED IMPLANT N/A 06/21/2020  ? Procedure: RADIOACTIVE SEED IMPLANT/BRACHYTHERAPY IMPLANT;  Surgeon: Ardis Hughs, MD;  Location: [facility];  Service: Urology;  Laterality: N/A;  ?  SHOULDER ARTHROSCOPY W/ ROTATOR CUFF REPAIR Right 09/03/2013  ? SHOULDER ARTHROSCOPY WITH SUBACROMIAL DECOMPRESSION, ROTATOR CUFF REPAIR AND BICEP TENDON REPAIR Left 09/03/2013  ? Procedure: LEFT SHOULDER ARTHROSCOPY WITH EXTENSIVE DEBRIDMENT, DISTAL CLAVICULECTOMY, ROTATOR CUFF REPAIR AND SUBACROMIAL DECOMPRESSION PARTIAL ACRIOMIOPLASTY WITH CORACOACROMIAL RELEASE;  Surgeon: Renette Butters, MD;  Location: [place];  Service: Orthopedics;  Laterality: Left;  ? SPACE OAR INSTILLATION N/A 06/21/2020  ? Procedure: SPACE OAR INSTILLATION;  Surgeon: Ardis Hughs, MD;  Location: [facility];  Service: Urology;  Laterality: N/A;  ? TEE WITHOUT CARDIOVERSION N/A 07/20/2018  ? Procedure: TRANSESOPHAGEAL ECHOCARDIOGRAM (TEE);  Surgeon: Grace Isaac, MD;  Location: [place];  Service: Open Heart Surgery;  Laterality: N/A;  ? TRANSURETHRAL RESECTION OF BLADDER TUMOR WITH MITOMYCIN-C  08/24/2008   '@[facility]'$   ? ? ? ? ? ?Home Medications   ? ?Prior to Admission medications   ?Medication Sig Start Date End Date Taking? Authorizing Provider  ?traMADol (ULTRAM) 50 MG tablet Take 1 tablet (50 mg total) by mouth every 12 (twelve) hours as needed (pain). 02/11/22  Yes Barrett Henle, MD  ?allopurinol (ZYLOPRIM) 100 MG tablet Take 200 mg by mouth daily.     [provider]  ?amLODipine (NORVASC) 10 MG tablet Take 1 tablet (10 mg total) by mouth daily. **Patient need a appointment for future refill** 09/18/21   Minus Breeding, MD  ?aspirin EC 81 MG tablet Take 81 mg by mouth daily.    [provider]  ?atorvastatin (LIPITOR) 40 MG tablet Take 1 tablet (40 mg total) by mouth daily. 11/03/20   Lendon Colonel, NP  ?Blood Glucose Monitoring Suppl (FREESTYLE FREEDOM LITE) w/Device KIT Use to check blood sugar three times daily. DX:E11.65 02/02/20   Elayne Snare, MD  ?Boswellia-Glucosamine-Vit D (OSTEO BI-FLEX ONE PER DAY PO) Take 1 tablet by mouth daily.    [provider]   ?Cholecalciferol (VITAMIN D3 PO) Take 1 capsule by mouth daily.    [provider]  ?CINNAMON PO Take 1 capsule by mouth daily.    [provider]  ?cloNIDine (CATAPRES) 0.1 MG tablet Take 1 tablet (0.1 mg total) by mouth 2 (two) times daily. 02/28/21   Minus Breeding, MD  ?hydrALAZINE (APRESOLINE) 50 MG tablet TAKE 1 TABLET BY MOUTH THREE TIMES A DAY 02/21/21   Minus Breeding, MD  ?insulin aspart (NOVOLOG) 100 UNIT/ML injection Use max of 85 units daily via insulin pump 01/03/22   Elayne Snare, MD  ?isosorbide mononitrate (IMDUR) 120 MG 24 hr tablet Take 1 tablet (120 mg total) by mouth daily. 12/20/21 12/15/22  Minus Breeding, MD  ?latanoprost (XALATAN) 0.005 % ophthalmic solution Place 1 drop into both eyes at bedtime. 09/18/21   [provider]  ?metoprolol tartrate (LOPRESSOR) 50 MG tablet TAKE 1 TABLET BY MOUTH TWICE A DAY 06/11/21   Minus Breeding, MD  ?Misc  Natural Products (TART CHERRY ADVANCED PO) Take 1 tablet by mouth daily.    [provider]  ?Multiple Vitamins-Minerals (CENTRUM SILVER PO) Take 1 tablet by mouth daily.    [provider]  ?nitroGLYCERIN (NITROSTAT) 0.4 MG SL tablet Place 1 tablet (0.4 mg total) under the tongue every 5 (five) minutes as needed for chest pain. 10/26/21 01/24/22  Minus Breeding, MD  ?sertraline (ZOLOFT) 100 MG tablet Take 100 mg by mouth daily. 02/25/20   [provider]  ?tamsulosin (FLOMAX) 0.4 MG CAPS capsule Take 1 capsule (0.4 mg total) by mouth daily. 06/21/20   Ardis Hughs, MD  ? ? ?Family History ?Family History  ?Problem Relation Age of Onset  ? Diabetes Mother   ? Lung cancer Mother   ? Cancer Father   ? Coronary artery disease Father   ?     MI at age 41.   ? Lung cancer Father   ? Sudden death Son   ? CAD Maternal Grandfather   ?     MI in his 63's  ? CAD Paternal Grandfather   ? Breast cancer Neg Hx   ? Colon cancer Neg Hx   ? Pancreatic cancer Neg Hx   ? ? ?Social History ?Social History  ? ?Tobacco Use   ? Smoking status: Former  ?  Packs/day: 2.00  ?  Years: 25.00  ?  Pack years: 50.00  ?  Types: Cigarettes  ?  Quit date: 09/11/1997  ?  Years since quitting: 24.4  ? Smokeless tobacco: Never  ?Vaping Use  ? Vaping

## 2022-02-15 DIAGNOSIS — D631 Anemia in chronic kidney disease: Secondary | ICD-10-CM | POA: Diagnosis not present

## 2022-02-15 DIAGNOSIS — N184 Chronic kidney disease, stage 4 (severe): Secondary | ICD-10-CM | POA: Diagnosis not present

## 2022-02-15 DIAGNOSIS — N2581 Secondary hyperparathyroidism of renal origin: Secondary | ICD-10-CM | POA: Diagnosis not present

## 2022-02-15 DIAGNOSIS — I129 Hypertensive chronic kidney disease with stage 1 through stage 4 chronic kidney disease, or unspecified chronic kidney disease: Secondary | ICD-10-CM | POA: Diagnosis not present

## 2022-02-19 DIAGNOSIS — H401131 Primary open-angle glaucoma, bilateral, mild stage: Secondary | ICD-10-CM | POA: Diagnosis not present

## 2022-02-19 DIAGNOSIS — H25813 Combined forms of age-related cataract, bilateral: Secondary | ICD-10-CM | POA: Diagnosis not present

## 2022-02-22 ENCOUNTER — Ambulatory Visit (INDEPENDENT_AMBULATORY_CARE_PROVIDER_SITE_OTHER): Payer: Medicare Other | Admitting: Endocrinology

## 2022-02-22 ENCOUNTER — Encounter: Payer: Self-pay | Admitting: Endocrinology

## 2022-02-22 VITALS — BP 144/62 | HR 54 | Ht 66.0 in | Wt 200.0 lb

## 2022-02-22 DIAGNOSIS — I2510 Atherosclerotic heart disease of native coronary artery without angina pectoris: Secondary | ICD-10-CM

## 2022-02-22 DIAGNOSIS — Z794 Long term (current) use of insulin: Secondary | ICD-10-CM

## 2022-02-22 DIAGNOSIS — E1165 Type 2 diabetes mellitus with hyperglycemia: Secondary | ICD-10-CM

## 2022-02-22 DIAGNOSIS — E1121 Type 2 diabetes mellitus with diabetic nephropathy: Secondary | ICD-10-CM

## 2022-02-22 NOTE — Patient Instructions (Signed)
Use activity mode when working as needed

## 2022-02-22 NOTE — Progress Notes (Unsigned)
Patient ID: Alec [name], male   DOB: 01/13/1946, 76 y.o.   MRN: [id number]             Reason for Appointment:  Follow-up for Type 2 Diabetes    History of Present Illness:          Diagnosis: Type 2 diabetes mellitus, date of diagnosis: 2001       Prior history: He was seen in consultation in 11/2014 when his A1c was 7.4 Since his blood sugars were averaging over 200 he was given Victoza in addition to his basal bolus insulin regimen in 6/16 He started on the V go pump in October 2016  Recent history:    INSULIN regimen  on T-Slim pump:    BASAL rate settings:  Midnight = 2.15, 3 AM = 2.2, 8 AM = 2.4, 4 PM = 2.7, 7 PM = 3.0 and 10 PM = 2.4  Boluses 12- 16 units at breakfast, 8 units lunch and about 14 at dinner  Current total basal insulin dose 60 units, total not calculated  Non-insulin hypoglycemic drugs the patient is taking are: None   His A1c is lower than expected for his sugars at 5.6 This compares to last 2-week average CGM reading of 129   Current blood sugar patterns from analysis of his Dexcom sensor download, daily management and problems identified:   Overall blood sugars are lower than before but still relatively evenly controlled at most times with very little hypoglycemia  At times he will have high postprandial readings likely to the [facility] inadequate insulin at lunch for sometimes late boluses He has not used [place] because the cardiologist did not want him to and his renal function is relatively worse  Has difficulty affording GLP-1 drugs   Currently is using nearly 70% of his insulin as basal Generally only using between 60 to 100 g of carbohydrates for most of his meals but he will take occasional coverage for snacks also, likely not getting extra insulin for any high fat meals at lunch Does not have any significant episodes of hypoglycemia Has no issues with his infusion sets or Dexcom  Highest blood sugars are relatively more afternoon or  evening Weight has leveled off Blood sugar patterns are detailed below  Glucose monitoring:  Dexcom  Summary of patterns: LOWEST blood sugars are early morning before 6 AM and HIGHEST blood sugars are either midday or late afternoon  Has sporadic HYPERGLYCEMIA is mostly occurring midday and after lunch but occasionally after dinner also Usually blood sugars are generally over 225 at the peak  Blood sugars are relatively lower overnight with only occasional low normal or minimal hypoglycemia HYPOGLYCEMIA is rare with only occasional overnight low readings transiently around 5 AM and also rarely around 7 PM OVERNIGHT readings are evenly controlled without the sleep mode, may start of higher around bedtime or early part of the night   POSTPRANDIAL readings are on an average not rising excessively but periodically will have readings around 200 or 225 after lunch and rarely after dinner Blood sugars are not consistently higher after breakfast    Statistics:    CGM use % of time   2-week average/SD 129/35  Time in range       90%  % Time Above 180 9  % Time above 250   % Time Below 70 1      PRE-MEAL  overnight  mornings  afternoon  evening Overall  Glucose range:  Averages: 119 124 135 137 129   PREVIOUSLY:   CGM use % of time   2-week average/SD 148+/-47  Time in range       75 %  % Time Above 180 24  % Time above 250   % Time Below 70 0      PRE-MEAL  overnight  mornings  afternoon  evening Overall  Glucose range:     49-367  Averages:  127 130 165 168 148    Self-care:  Meals: 3 meals per day. Lunch  1 pm Dinner 6 pm Breakfast is usually an egg sandwich but sometimes biscuits   Eating balanced meals in the evening.  He will have snacks with granola bars              Dietician visit, most recent: 06/2020.               Weight history:  Wt Readings from Last 3 Encounters:  02/22/22 200 lb (90.7 kg)  02/04/22 197 lb 4.8 oz (89.5 kg)  12/21/21 201 lb (91.2  kg)    Glycemic control:   Lab Results  Component Value Date   HGBA1C 5.6 01/25/2022   HGBA1C 5.8 (A) 09/25/2021   HGBA1C 6.0 (A) 05/25/2021   Lab Results  Component Value Date   MICROALBUR 397.0 (H) 05/25/2021   LDLCALC 69 05/25/2021   CREATININE 2.86 (H) 01/25/2022   Lab Results  Component Value Date   HGB 11.5 (L) 12/21/2021     Past history:  He was not symptomatic at time of diagnosis and was probably treated with Amaryl only.  No details are available of previous management However he thinks he was not given metformin at any time because of his "kidney problem" He had a A1c of over 9% in 2013 and may have been started on insulin at that time. Most likely has been taking Levemir and NovoLog since then A1c records show his results were over 8% in 2014 and in 4/15 but subsequently down to 7.1 in November 2015 Amaryl was stopped in 7/16  Other active problems are addressed in Review of systems   Allergies as of 02/22/2022       Reactions   Nsaids Other (See Comments)   Told to avoid by dr         Medication List        Accurate as of Feb 22, 2022  9:50 AM. If you have any questions, ask your nurse or doctor.          STOP taking these medications    tamsulosin 0.4 MG Caps capsule Commonly known as: FLOMAX Stopped by: Elayne Snare, MD   traMADol 50 MG tablet Commonly known as: ULTRAM Stopped by: Elayne Snare, MD       TAKE these medications    allopurinol 100 MG tablet Commonly known as: ZYLOPRIM Take 200 mg by mouth daily.   amLODipine 10 MG tablet Commonly known as: NORVASC Take 1 tablet (10 mg total) by mouth daily. **Patient need a appointment for future refill**   aspirin EC 81 MG tablet Take 81 mg by mouth daily.   atorvastatin 40 MG tablet Commonly known as: LIPITOR Take 1 tablet (40 mg total) by mouth daily.   CENTRUM SILVER PO Take 1 tablet by mouth daily.   CINNAMON PO Take 1 capsule by mouth daily.   cloNIDine 0.1 MG  tablet Commonly known as: CATAPRES Take 1 tablet (0.1 mg total) by mouth 2 (two) times  daily.   FreeStyle [place] Lite w/Device Kit Use to check blood sugar three times daily. DX:E11.65   hydrALAZINE 50 MG tablet Commonly known as: APRESOLINE TAKE 1 TABLET BY MOUTH THREE TIMES A DAY   insulin aspart 100 UNIT/ML injection Commonly known as: novoLOG Use max of 85 units daily via insulin pump   isosorbide mononitrate 120 MG 24 hr tablet Commonly known as: IMDUR Take 1 tablet (120 mg total) by mouth daily.   latanoprost 0.005 % ophthalmic solution Commonly known as: XALATAN Place 1 drop into both eyes at bedtime.   metoprolol tartrate 50 MG tablet Commonly known as: LOPRESSOR TAKE 1 TABLET BY MOUTH TWICE A DAY   nitroGLYCERIN 0.4 MG SL tablet Commonly known as: NITROSTAT Place 1 tablet (0.4 mg total) under the tongue every 5 (five) minutes as needed for chest pain.   OSTEO BI-FLEX ONE PER DAY PO Take 1 tablet by mouth daily.   sertraline 100 MG tablet Commonly known as: ZOLOFT Take 100 mg by mouth daily.   TART CHERRY ADVANCED PO Take 1 tablet by mouth daily.   VITAMIN D3 PO Take 1 capsule by mouth daily.        Allergies:  Allergies  Allergen Reactions   Nsaids Other (See Comments)    Told to avoid by dr     Past Medical History:  Diagnosis Date   Anxiety    Arthritis    Bilateral lower extremity edema    Chronic gout    06-19-2020  per pt last episode 6 months , great toe   CKD (chronic kidney disease), stage III ([place])    followed by pcp   Coronary artery disease cardiologist--- dr hochrein   a. nuclear study 04-25-2017 low risk b. ETT abnormal 07-07-2018  c.  cardiac cath 07-15-2018  severe multivessel disease   d.  CABG x3   Degenerative arthritis of shoulder region 08/2013   left   DOE (dyspnea on exertion)    History of bladder cancer 07/2008   s/p  TURBT   History of cellulitis 11/2017   left upper arm   History of kidney stones     Hyperlipidemia    Hypertension    followed by pcp/ cardiology   IDDM (insulin dependent diabetes mellitus)    endocrinologist--- dr Dwyane Dee---  pt uses insulin pump and dexcom  (06-19-2020 per pt fasting sugar-- 98--110)   Insulin pump in place    OSA (obstructive sleep apnea)    Prostate cancer [facility]) urologist--- dr herrick/  oncologist--- manning   dx 11/ 2017  Gleason 3+3 active survillance;  bx 03-14-2020 Gleason 3+4   RA (rheumatoid arthritis) ([place])    rhemotologist--- dr Normajean Glasgow---     S/P CABG x 3 07/15/2018   LIMA to LAD;  SVG to PDA;  SVG to RI    Past Surgical History:  Procedure Laterality Date   CHOLECYSTECTOMY  08/29/2012   Procedure: LAPAROSCOPIC CHOLECYSTECTOMY WITH INTRAOPERATIVE CHOLANGIOGRAM;  Surgeon: Imogene Burn. Tsuei, MD;  [facility];  Service: General;  Laterality: N/A;   CORONARY ARTERY BYPASS GRAFT N/A 07/20/2018   Procedure: CORONARY ARTERY BYPASS GRAFTING (CABG) times three using left internal mammary artery to the LAD, and using endoscopically harvested right saphenous vein to PDA and intermedius.;  Surgeon: Grace Isaac, MD;  Location: [place];  Service: Open Heart Surgery;  Laterality: N/A;   LEFT HEART CATH AND CORONARY ANGIOGRAPHY N/A 07/15/2018   Procedure: LEFT HEART CATH AND CORONARY ANGIOGRAPHY;  Surgeon: Glenetta Hew  W, MD;  Location: [place] [facility];  Service: Cardiovascular;  Laterality: N/A;   RADIOACTIVE SEED IMPLANT N/A 06/21/2020   Procedure: RADIOACTIVE SEED IMPLANT/BRACHYTHERAPY IMPLANT;  Surgeon: Ardis Hughs, MD;  Location[facility];  Service: Urology;  Laterality: N/A;   SHOULDER ARTHROSCOPY W/ ROTATOR CUFF REPAIR Right 09/03/2013   SHOULDER ARTHROSCOPY WITH SUBACROMIAL DECOMPRESSION, ROTATOR CUFF REPAIR AND BICEP TENDON REPAIR Left 09/03/2013   Procedure: LEFT SHOULDER ARTHROSCOPY WITH EXTENSIVE DEBRIDMENT, DISTAL CLAVICULECTOMY, ROTATOR CUFF REPAIR AND SUBACROMIAL DECOMPRESSION PARTIAL ACRIOMIOPLASTY  WITH CORACOACROMIAL RELEASE;  Surgeon: Renette Butters, MD;  Location: [place];  Service: Orthopedics;  Laterality: Left;   SPACE OAR INSTILLATION N/A 06/21/2020   Procedure: SPACE OAR INSTILLATION;  Surgeon: Ardis Hughs, MD;  Location: [facility];  Service: Urology;  Laterality: N/A;   TEE WITHOUT CARDIOVERSION N/A 07/20/2018   Procedure: TRANSESOPHAGEAL ECHOCARDIOGRAM (TEE);  Surgeon: Grace Isaac, MD;  Location: [place];  Service: Open Heart Surgery;  Laterality: N/A;   TRANSURETHRAL RESECTION OF BLADDER TUMOR WITH MITOMYCIN-C  08/24/2008   _0     Family History  Problem Relation Age of Onset   Diabetes Mother    Lung cancer Mother    Cancer Father    Coronary artery disease Father        MI at age 79.    Lung cancer Father    Sudden death Son    CAD Maternal Grandfather        MI in his 32's   CAD Paternal Grandfather    Breast cancer Neg Hx    Colon cancer Neg Hx    Pancreatic cancer Neg Hx     Social History:  reports that he quit smoking about 24 years ago. His smoking use included cigarettes. He has a 50.00 pack-year smoking history. He has never used smokeless tobacco. He reports that he does not currently use alcohol. He reports that he does not use drugs.    Review of Systems      HYPERTENSION: Blood pressure followed by cardiologist and nephrologist He also checks at home  He is not on ACE or ARB as part of the management  BP Readings from Last 3 Encounters:  02/22/22 (!) 144/62  02/11/22 (!) 148/68  02/04/22 138/62           Lipids: he is taking Lipitor 40 mg from his PCP Lipids are controlled except for low HDL      Lab Results  Component Value Date   CHOL 126 05/25/2021   HDL 30.20 (L) 05/25/2021   LDLCALC 69 05/25/2021   TRIG 137.0 05/25/2021   CHOLHDL 4 05/25/2021               CKD: He has been followed by nephrologist for diabetic nephropathy.   Last urine microalbumin ratio is about 200 He is  not on an ACE inhibitor/ARB drug  His creatinine is variable but overall stable   Lab Results  Component Value Date   CREATININE 2.86 (H) 01/25/2022   CREATININE 2.68 (H) 10/26/2021   CREATININE 1.84 (H) 05/25/2021   Lab Results  Component Value Date   K 4.2 01/25/2022    Last foot exam in 12/22  Has neuropathic symptoms controlled with low-dose gabapentin, also on Requip from PCP   Eye exam last:  6/22   Physical Examination:  BP (!) 144/62 (BP Location: Left Arm, Patient Position: Sitting, Cuff Size: Normal)   Pulse (!) 54  Ht _0  (1.676 m)   Wt 200 lb (90.7 kg)   SpO2 97%   BMI 32.28 kg/m       1+ ankle edema present bilaterally  ASSESSMENT:  Diabetes type 2, insulin requiring  See history of present illness for detailed discussion of his current management, blood sugar patterns and from his Dexcom data and problems identified:  His A1c is lower than expected at 5.6  He is on the T-insulin pump with the control IQ  He has good control overall with improved and within target 90% of the time Generally blood sugars are better than before  LIPIDS: Needs to follow-up with PCP    PLAN: Basal rate at midnight will be 2.0, 3 AM = 2.0 also and 10 PM = 2.2 This will avoid tendency to low sugars overnight and after bedtime No change in carbohydrate ratio or correction factor He does need to make sure he takes extra insulin for any high fat meals at lunchtime outside  Also showed him how to use the ACTIVITY mode which she can use if very active at work and blood sugars are near normal, he will remember to turn it off when needed BOLUS before eating consistently Take correction boluses are if blood pressure is consistently or unexpectedly high Pump settings were changed in the office    Follow-up in 3 months   There are no Patient Instructions on file for this visit.   Total visit time including counseling = 30 minutes   Elayne Snare 02/22/2022, 9:50 AM    Note: This office note was prepared with Dragon voice recognition system technology. Any transcriptional errors that result from this process are unintentional.

## 2022-02-28 ENCOUNTER — Other Ambulatory Visit: Payer: Self-pay | Admitting: Cardiology

## 2022-03-07 ENCOUNTER — Encounter: Payer: Self-pay | Admitting: Endocrinology

## 2022-03-25 ENCOUNTER — Telehealth: Payer: Self-pay | Admitting: Cardiology

## 2022-03-25 DIAGNOSIS — Z01818 Encounter for other preprocedural examination: Secondary | ICD-10-CM | POA: Diagnosis not present

## 2022-03-25 DIAGNOSIS — H25812 Combined forms of age-related cataract, left eye: Secondary | ICD-10-CM | POA: Diagnosis not present

## 2022-03-25 DIAGNOSIS — H25811 Combined forms of age-related cataract, right eye: Secondary | ICD-10-CM | POA: Diagnosis not present

## 2022-03-25 DIAGNOSIS — H401131 Primary open-angle glaucoma, bilateral, mild stage: Secondary | ICD-10-CM | POA: Diagnosis not present

## 2022-03-27 ENCOUNTER — Inpatient Hospital Stay (HOSPITAL_BASED_OUTPATIENT_CLINIC_OR_DEPARTMENT_OTHER)
Admission: EM | Admit: 2022-03-27 | Discharge: 2022-03-29 | DRG: 291 | Disposition: A | Discharge status: Home / Self Care | Payer: Medicare Other | Attending: Internal Medicine | Admitting: Internal Medicine

## 2022-03-27 ENCOUNTER — Encounter (HOSPITAL_BASED_OUTPATIENT_CLINIC_OR_DEPARTMENT_OTHER): Payer: Self-pay | Admitting: *Deleted

## 2022-03-27 ENCOUNTER — Other Ambulatory Visit: Payer: Self-pay

## 2022-03-27 ENCOUNTER — Emergency Department (HOSPITAL_BASED_OUTPATIENT_CLINIC_OR_DEPARTMENT_OTHER): Payer: Medicare Other

## 2022-03-27 DIAGNOSIS — E1122 Type 2 diabetes mellitus with diabetic chronic kidney disease: Secondary | ICD-10-CM | POA: Diagnosis not present

## 2022-03-27 DIAGNOSIS — T502X5A Adverse effect of carbonic-anhydrase inhibitors, benzothiadiazides and other diuretics, initial encounter: Secondary | ICD-10-CM | POA: Diagnosis present

## 2022-03-27 DIAGNOSIS — Z7982 Long term (current) use of aspirin: Secondary | ICD-10-CM | POA: Diagnosis not present

## 2022-03-27 DIAGNOSIS — I5033 Acute on chronic diastolic (congestive) heart failure: Secondary | ICD-10-CM

## 2022-03-27 DIAGNOSIS — Z79899 Other long term (current) drug therapy: Secondary | ICD-10-CM

## 2022-03-27 DIAGNOSIS — I130 Hypertensive heart and chronic kidney disease with heart failure and stage 1 through stage 4 chronic kidney disease, or unspecified chronic kidney disease: Principal | ICD-10-CM | POA: Diagnosis present

## 2022-03-27 DIAGNOSIS — D696 Thrombocytopenia, unspecified: Secondary | ICD-10-CM | POA: Diagnosis present

## 2022-03-27 DIAGNOSIS — F419 Anxiety disorder, unspecified: Secondary | ICD-10-CM | POA: Diagnosis present

## 2022-03-27 DIAGNOSIS — M19012 Primary osteoarthritis, left shoulder: Secondary | ICD-10-CM | POA: Diagnosis not present

## 2022-03-27 DIAGNOSIS — I509 Heart failure, unspecified: Secondary | ICD-10-CM

## 2022-03-27 DIAGNOSIS — Z8551 Personal history of malignant neoplasm of bladder: Secondary | ICD-10-CM

## 2022-03-27 DIAGNOSIS — M069 Rheumatoid arthritis, unspecified: Secondary | ICD-10-CM | POA: Diagnosis present

## 2022-03-27 DIAGNOSIS — N179 Acute kidney failure, unspecified: Secondary | ICD-10-CM | POA: Diagnosis not present

## 2022-03-27 DIAGNOSIS — Z888 Allergy status to other drugs, medicaments and biological substances status: Secondary | ICD-10-CM

## 2022-03-27 DIAGNOSIS — E785 Hyperlipidemia, unspecified: Secondary | ICD-10-CM | POA: Diagnosis present

## 2022-03-27 DIAGNOSIS — D631 Anemia in chronic kidney disease: Secondary | ICD-10-CM | POA: Diagnosis present

## 2022-03-27 DIAGNOSIS — G4733 Obstructive sleep apnea (adult) (pediatric): Secondary | ICD-10-CM | POA: Diagnosis not present

## 2022-03-27 DIAGNOSIS — Z833 Family history of diabetes mellitus: Secondary | ICD-10-CM

## 2022-03-27 DIAGNOSIS — Z951 Presence of aortocoronary bypass graft: Secondary | ICD-10-CM | POA: Diagnosis not present

## 2022-03-27 DIAGNOSIS — R0602 Shortness of breath: Secondary | ICD-10-CM | POA: Diagnosis not present

## 2022-03-27 DIAGNOSIS — E8770 Fluid overload, unspecified: Principal | ICD-10-CM

## 2022-03-27 DIAGNOSIS — M1A9XX0 Chronic gout, unspecified, without tophus (tophi): Secondary | ICD-10-CM | POA: Diagnosis present

## 2022-03-27 DIAGNOSIS — N184 Chronic kidney disease, stage 4 (severe): Secondary | ICD-10-CM | POA: Diagnosis not present

## 2022-03-27 DIAGNOSIS — R001 Bradycardia, unspecified: Secondary | ICD-10-CM | POA: Diagnosis present

## 2022-03-27 DIAGNOSIS — M1990 Unspecified osteoarthritis, unspecified site: Secondary | ICD-10-CM | POA: Diagnosis not present

## 2022-03-27 DIAGNOSIS — N1832 Chronic kidney disease, stage 3b: Secondary | ICD-10-CM

## 2022-03-27 DIAGNOSIS — Z8546 Personal history of malignant neoplasm of prostate: Secondary | ICD-10-CM | POA: Diagnosis not present

## 2022-03-27 DIAGNOSIS — I2510 Atherosclerotic heart disease of native coronary artery without angina pectoris: Secondary | ICD-10-CM | POA: Diagnosis present

## 2022-03-27 DIAGNOSIS — N189 Chronic kidney disease, unspecified: Secondary | ICD-10-CM

## 2022-03-27 DIAGNOSIS — I10 Essential (primary) hypertension: Secondary | ICD-10-CM | POA: Diagnosis present

## 2022-03-27 DIAGNOSIS — D72819 Decreased white blood cell count, unspecified: Secondary | ICD-10-CM | POA: Diagnosis present

## 2022-03-27 DIAGNOSIS — Z87891 Personal history of nicotine dependence: Secondary | ICD-10-CM

## 2022-03-27 DIAGNOSIS — Z87442 Personal history of urinary calculi: Secondary | ICD-10-CM

## 2022-03-27 DIAGNOSIS — Z8249 Family history of ischemic heart disease and other diseases of the circulatory system: Secondary | ICD-10-CM

## 2022-03-27 DIAGNOSIS — Z9641 Presence of insulin pump (external) (internal): Secondary | ICD-10-CM | POA: Diagnosis present

## 2022-03-27 DIAGNOSIS — Z794 Long term (current) use of insulin: Secondary | ICD-10-CM

## 2022-03-27 HISTORY — DX: Anemia, unspecified: D64.9

## 2022-03-27 HISTORY — DX: Thrombocytopenia, unspecified: D69.6

## 2022-03-27 HISTORY — DX: Nonrheumatic mitral (valve) insufficiency: I34.0

## 2022-03-27 HISTORY — DX: Nonrheumatic aortic (valve) insufficiency: I35.1

## 2022-03-27 HISTORY — DX: Chronic kidney disease, stage 4 (severe): N18.4

## 2022-03-27 LAB — BASIC METABOLIC PANEL
Anion gap: 6 (ref 5–15)
BUN: 27 mg/dL — ABNORMAL HIGH (ref 8–23)
CO2: 20 mmol/L — ABNORMAL LOW (ref 22–32)
Calcium: 8.9 mg/dL (ref 8.9–10.3)
Chloride: 110 mmol/L (ref 98–111)
Creatinine, Ser: 2.46 mg/dL — ABNORMAL HIGH (ref 0.61–1.24)
GFR, Estimated: 27 mL/min — ABNORMAL LOW (ref >60)
Glucose, Bld: 209 mg/dL — ABNORMAL HIGH (ref 70–99)
Potassium: 3.8 mmol/L (ref 3.5–5.1)
Sodium: 136 mmol/L (ref 135–145)

## 2022-03-27 LAB — CBC WITH DIFFERENTIAL/PLATELET
Abs Immature Granulocytes: 0.01 10*3/uL (ref 0.00–0.07)
Basophils Absolute: 0 10*3/uL (ref 0.0–0.1)
Basophils Relative: 1 %
Eosinophils Absolute: 0.1 10*3/uL (ref 0.0–0.5)
Eosinophils Relative: 1 %
HCT: 28.4 % — ABNORMAL LOW (ref 39.0–52.0)
Hemoglobin: 9.6 g/dL — ABNORMAL LOW (ref 13.0–17.0)
Immature Granulocytes: 0 %
Lymphocytes Relative: 18 %
Lymphs Abs: 0.7 10*3/uL (ref 0.7–4.0)
MCH: 32.1 pg (ref 26.0–34.0)
MCHC: 33.8 g/dL (ref 30.0–36.0)
MCV: 95 fL (ref 80.0–100.0)
Monocytes Absolute: 0.3 10*3/uL (ref 0.1–1.0)
Monocytes Relative: 7 %
Neutro Abs: 2.6 10*3/uL (ref 1.7–7.7)
Neutrophils Relative %: 73 %
Platelets: 120 10*3/uL — ABNORMAL LOW (ref 150–400)
RBC: 2.99 MIL/uL — ABNORMAL LOW (ref 4.22–5.81)
RDW: 14.6 % (ref 11.5–15.5)
WBC: 3.7 10*3/uL — ABNORMAL LOW (ref 4.0–10.5)
nRBC: 0 % (ref 0.0–0.2)

## 2022-03-27 LAB — TROPONIN I (HIGH SENSITIVITY)
Troponin I (High Sensitivity): 19 ng/L — ABNORMAL HIGH (ref <18)
Troponin I (High Sensitivity): 22 ng/L — ABNORMAL HIGH (ref <18)

## 2022-03-27 LAB — GLUCOSE, CAPILLARY: Glucose-Capillary: 104 mg/dL — ABNORMAL HIGH (ref 70–99)

## 2022-03-27 LAB — BRAIN NATRIURETIC PEPTIDE: B Natriuretic Peptide: 1019.1 pg/mL — ABNORMAL HIGH (ref 0.0–100.0)

## 2022-03-27 MED ORDER — FUROSEMIDE 10 MG/ML IJ SOLN
40.0000 mg | Freq: Once | INTRAMUSCULAR | Status: AC
  Administered 2022-03-27: 40 mg via INTRAVENOUS
  Filled 2022-03-27: qty 4

## 2022-03-27 MED ORDER — HEPARIN SODIUM (PORCINE) 5000 UNIT/ML IJ SOLN
5000.0000 [IU] | Freq: Three times a day (TID) | INTRAMUSCULAR | Status: DC
  Administered 2022-03-28 – 2022-03-29 (×3): 5000 [IU] via SUBCUTANEOUS
  Filled 2022-03-27 (×3): qty 1

## 2022-03-27 MED ORDER — FUROSEMIDE 10 MG/ML IJ SOLN
40.0000 mg | Freq: Every day | INTRAMUSCULAR | Status: DC
  Administered 2022-03-28: 40 mg via INTRAVENOUS
  Filled 2022-03-27: qty 4

## 2022-03-27 NOTE — ED Triage Notes (Signed)
Recently has noted an increase in dyspnea on exertion. Began approx 2 weeks. When lying down states he feels like he is wheezing and having shortness of breath. Been having some chest pressure when doing activity. States he has been voiding as usual

## 2022-03-27 NOTE — ED Provider Notes (Signed)
[facility] Provider Note   CSN: [id number] Arrival date & time: 03/27/22  1133     History Chief Complaint  Patient presents with   Shortness of Breath    Alec [name] is a 76 y.o. male with history of coronary disease status post CABG, diabetes mellitus, hypertension, hyperlipidemia, and CKD who presents to the emergency department with a 2-week history of shortness of breath.  Patient states his shortness of breath is worse with exertion and at night his wife hears a slight wheeze.  Patient is unable to lay flat at night secondary to shortness of breath and does have some PND as well.  He denies any abdominal pain, nausea, vomiting, diarrhea.  Per chart review, patient was seen and evaluated by his cardiologist in March where he was complaining of shortness of breath at that time as well.  They were discussing in the office cardiac catheterization.  Further review shows that the patient was having shortness of breath prior to getting his bypass.   Shortness of Breath      Home Medications Prior to Admission medications   Medication Sig Start Date End Date Taking? Authorizing Provider  allopurinol (ZYLOPRIM) 100 MG tablet Take 200 mg by mouth daily.     [provider]  amLODipine (NORVASC) 10 MG tablet Take 1 tablet (10 mg total) by mouth daily. **Patient need a appointment for future refill** 09/18/21   Minus Breeding, MD  aspirin EC 81 MG tablet Take 81 mg by mouth daily.    [provider]  atorvastatin (LIPITOR) 40 MG tablet Take 1 tablet (40 mg total) by mouth daily. 11/03/20   Lendon Colonel, NP  Blood Glucose Monitoring Suppl (FREESTYLE FREEDOM LITE) w/Device KIT Use to check blood sugar three times daily. DX:E11.65 02/02/20   Elayne Snare, MD  Boswellia-Glucosamine-Vit D (OSTEO BI-FLEX ONE PER DAY PO) Take 1 tablet by mouth daily.    [provider]  Cholecalciferol (VITAMIN D3 PO) Take 1 capsule by mouth daily.     [provider]  CINNAMON PO Take 1 capsule by mouth daily.    [provider]  cloNIDine (CATAPRES) 0.1 MG tablet Take 1 tablet (0.1 mg total) by mouth 2 (two) times daily. 02/28/21   Minus Breeding, MD  hydrALAZINE (APRESOLINE) 50 MG tablet TAKE 1 TABLET BY MOUTH THREE TIMES A DAY 02/28/22   Minus Breeding, MD  insulin aspart (NOVOLOG) 100 UNIT/ML injection Use max of 85 units daily via insulin pump 01/03/22   Elayne Snare, MD  isosorbide mononitrate (IMDUR) 120 MG 24 hr tablet Take 1 tablet (120 mg total) by mouth daily. 12/20/21 12/15/22  Minus Breeding, MD  latanoprost (XALATAN) 0.005 % ophthalmic solution Place 1 drop into both eyes at bedtime. 09/18/21   [provider]  metoprolol tartrate (LOPRESSOR) 50 MG tablet TAKE 1 TABLET BY MOUTH TWICE A DAY 06/11/21   Minus Breeding, MD  Misc Natural Products (TART CHERRY ADVANCED PO) Take 1 tablet by mouth daily.    [provider]  Multiple Vitamins-Minerals (CENTRUM SILVER PO) Take 1 tablet by mouth daily.    [provider]  nitroGLYCERIN (NITROSTAT) 0.4 MG SL tablet Place 1 tablet (0.4 mg total) under the tongue every 5 (five) minutes as needed for chest pain. 10/26/21 01/24/22  Minus Breeding, MD  sertraline (ZOLOFT) 100 MG tablet Take 100 mg by mouth daily. 02/25/20   [provider]      Allergies    Nsaids  Review of Systems   Review of Systems  Respiratory:  Positive for shortness of breath.   All other systems reviewed and are negative.   Physical Exam Updated Vital Signs BP (!) 176/66   Pulse (!) 54   Temp 97.8 F (36.6 C)   Resp 18   Ht $R'5\' 6"'lg$  (1.676 m)   Wt 91.5 kg   SpO2 96%   BMI 32.56 kg/m  Physical Exam Vitals and nursing note reviewed.  Constitutional:      General: He is not in acute distress.    Appearance: Normal appearance.  HENT:     Head: Normocephalic and atraumatic.  Eyes:     General:        Right eye: No discharge.        Left eye: No discharge.   Cardiovascular:     Rate and Rhythm: Regular rhythm. Bradycardia present.     Comments: S1/S2 are distinct without any evidence of murmur, rubs, or gallops.  Radial pulses are 2+ bilaterally.  Dorsalis pedis pulses are 2+ bilaterally.   Pulmonary:     Effort: Tachypnea present.     Comments: Clear to auscultation bilaterally. No respiratory distress.  No evidence of wheezes, rales, or rhonchi heard throughout. Abdominal:     General: Abdomen is flat. Bowel sounds are normal. There is no distension.     Tenderness: There is no abdominal tenderness. There is no guarding or rebound.  Musculoskeletal:        General: Normal range of motion.     Cervical back: Neck supple.     Right lower leg: 1+ Pitting Edema present.     Left lower leg: 1+ Pitting Edema present.  Skin:    General: Skin is warm and dry.     Findings: No rash.  Neurological:     General: No focal deficit present.     Mental Status: He is alert.  Psychiatric:        Mood and Affect: Mood normal.        Behavior: Behavior normal.     ED Results / Procedures / Treatments   Labs (all labs ordered are listed, but only abnormal results are displayed) Labs Reviewed  CBC WITH DIFFERENTIAL/PLATELET - Abnormal; Notable for the following components:      Result Value   WBC 3.7 (*)    RBC 2.99 (*)    Hemoglobin 9.6 (*)    HCT 28.4 (*)    Platelets 120 (*)    All other components within normal limits  BASIC METABOLIC PANEL - Abnormal; Notable for the following components:   CO2 20 (*)    Glucose, Bld 209 (*)    BUN 27 (*)    Creatinine, Ser 2.46 (*)    GFR, Estimated 27 (*)    All other components within normal limits  BRAIN NATRIURETIC PEPTIDE - Abnormal; Notable for the following components:   B Natriuretic Peptide 1,019.1 (*)    All other components within normal limits  TROPONIN I (HIGH SENSITIVITY) - Abnormal; Notable for the following components:   Troponin I (High Sensitivity) 19 (*)    All other components  within normal limits  TROPONIN I (HIGH SENSITIVITY) - Abnormal; Notable for the following components:   Troponin I (High Sensitivity) 22 (*)    All other components within normal limits    EKG EKG Interpretation  Date/Time:  Wednesday March 27 2022 11:47:19 EDT Ventricular Rate:  58 PR Interval:  224 QRS Duration: 97 QT  Interval:  458 QTC Calculation: 450 R Axis:   69 Text Interpretation: Sinus rhythm Prolonged PR interval Nonspecific T abnormalities, lateral leads No significant change since last tracing Confirmed by Richardean Canal [phone]) on 03/27/2022 3:38:16 PM  Radiology DG Chest 2 View  Result Date: 03/27/2022 CLINICAL DATA:  Shortness of breath EXAM: CHEST - 2 VIEW COMPARISON:  05/25/2020 FINDINGS: Transverse diameter of heart is slightly increased. There is evidence of previous coronary bypass surgery. Central pulmonary vessels are prominent. There are no signs of alveolar pulmonary edema or new focal infiltrates. There is minimal blunting of both posterior CP angles. There is no pneumothorax. IMPRESSION: Central pulmonary vessels are prominent suggesting mild CHF. Small bilateral pleural effusions. There are no signs of alveolar pulmonary edema or new focal infiltrates. Electronically Signed   By: Ernie Avena M.D.   On: 03/27/2022 12:31    Procedures Procedures    Medications Ordered in ED Medications  furosemide (LASIX) injection 40 mg (40 mg Intravenous Given 03/27/22 1443)    ED Course/ Medical Decision Making/ A&P Clinical Course as of 03/27/22 1630  Wed Mar 27, 2022  1330 I personally reviewed echocardiogram from January of this year which was normal. [CF]  1345 CBC with Differential(!) Evidence of leukopenia and anemia. Leukopenia is at baseline.  Anemia is worsening from previous result. [CF]  1346 Basic metabolic panel(!) Elevated creatinine in the setting of chronic kidney disease.  Seems to be at his baseline.  Elevated glucose. [CF]  1347 Troponin I  (High Sensitivity)(!) initial troponin is elevated. [CF]  1347 Brain natriuretic peptide(!) BNP is 1000. [CF]  1347 DG Chest 2 View I personally ordered and interpreted chest x-ray which shows bilateral pleural effusions.  No evidence of pneumonia.  I do agree with radiology interpretation. [CF]  1627 Spoke with Dr. Maisie Fus with [facility] hospitalist who agrees to admit the patient. [CF]    Clinical Course User Index [CF] Teressa Lower, PA-C                           Medical Decision Making COPE MARTE is a 76 y.o. male patient who presents to the emergency department for further evaluation of shortness of breath.  Differential diagnosis includes CHF, ACS, stable angina, pneumonia.  Patient is satting well on room air and has not required any additional supplemental oxygen.  We will get cardiac labs in addition to a chest x-ray.   Amount and/or Complexity of Data Reviewed External Data Reviewed: radiology.    Details: Recent echo done in January shows grade 2 diastolic dysfunction with a normal ejection fraction. Labs: ordered. Decision-making details documented in ED Course. Radiology: ordered and independent interpretation performed. Decision-making details documented in ED Course. ECG/medicine tests: ordered.  Risk Prescription drug management. Parenteral controlled substances. Decision regarding hospitalization. Risk Details: Patient has evidence of fluid overload here in the department with a BNP over thousand and bilateral pleural effusions.  Patient has not required any additional oxygen.  Patient did have some diastolic dysfunction on recent echocardiogram but had a normal EF.  I feel that the patient should be admitted for further management as the patient does not take any fluid pills.  I will talk to the hospitalist to get him admitted.   Final Clinical Impression(s) / ED Diagnoses Final diagnoses:  Hypervolemia, unspecified hypervolemia type  Shortness of breath     Rx / DC Orders ED Discharge Orders     None  Myna Bright [place], PA-C 03/27/22 1631    Davonna Belling, MD 03/28/22 [phone]

## 2022-03-27 NOTE — ED Notes (Signed)
RT Staff to triage to assess client here for c/o Shortness of Breath

## 2022-03-27 NOTE — ED Notes (Signed)
ED Provider at bedside. 

## 2022-03-28 ENCOUNTER — Inpatient Hospital Stay (HOSPITAL_COMMUNITY): Payer: Medicare Other

## 2022-03-28 ENCOUNTER — Encounter (HOSPITAL_COMMUNITY): Payer: Self-pay | Admitting: Internal Medicine

## 2022-03-28 DIAGNOSIS — I5033 Acute on chronic diastolic (congestive) heart failure: Secondary | ICD-10-CM | POA: Diagnosis not present

## 2022-03-28 DIAGNOSIS — N179 Acute kidney failure, unspecified: Secondary | ICD-10-CM

## 2022-03-28 DIAGNOSIS — E785 Hyperlipidemia, unspecified: Secondary | ICD-10-CM | POA: Diagnosis not present

## 2022-03-28 DIAGNOSIS — E8770 Fluid overload, unspecified: Secondary | ICD-10-CM | POA: Diagnosis not present

## 2022-03-28 DIAGNOSIS — I10 Essential (primary) hypertension: Secondary | ICD-10-CM | POA: Diagnosis not present

## 2022-03-28 DIAGNOSIS — Z951 Presence of aortocoronary bypass graft: Secondary | ICD-10-CM | POA: Diagnosis not present

## 2022-03-28 DIAGNOSIS — N189 Chronic kidney disease, unspecified: Secondary | ICD-10-CM

## 2022-03-28 LAB — BASIC METABOLIC PANEL
Anion gap: 8 (ref 5–15)
BUN: 27 mg/dL — ABNORMAL HIGH (ref 8–23)
CO2: 23 mmol/L (ref 22–32)
Calcium: 9.2 mg/dL (ref 8.9–10.3)
Chloride: 109 mmol/L (ref 98–111)
Creatinine, Ser: 2.52 mg/dL — ABNORMAL HIGH (ref 0.61–1.24)
GFR, Estimated: 26 mL/min — ABNORMAL LOW (ref >60)
Glucose, Bld: 94 mg/dL (ref 70–99)
Potassium: 4 mmol/L (ref 3.5–5.1)
Sodium: 140 mmol/L (ref 135–145)

## 2022-03-28 LAB — CBC
HCT: 28.2 % — ABNORMAL LOW (ref 39.0–52.0)
Hemoglobin: 9.8 g/dL — ABNORMAL LOW (ref 13.0–17.0)
MCH: 32.7 pg (ref 26.0–34.0)
MCHC: 34.8 g/dL (ref 30.0–36.0)
MCV: 94 fL (ref 80.0–100.0)
Platelets: 115 10*3/uL — ABNORMAL LOW (ref 150–400)
RBC: 3 MIL/uL — ABNORMAL LOW (ref 4.22–5.81)
RDW: 14.5 % (ref 11.5–15.5)
WBC: 3.4 10*3/uL — ABNORMAL LOW (ref 4.0–10.5)
nRBC: 0 % (ref 0.0–0.2)

## 2022-03-28 LAB — ECHOCARDIOGRAM COMPLETE
AR max vel: 3.45 cm2
AV Peak grad: 7.7 mmHg
Ao pk vel: 1.39 m/s
Area-P 1/2: 4.36 cm2
Calc EF: 56.1 %
Height: 66 in
MV M vel: 2.59 m/s
MV Peak grad: 26.8 mmHg
S' Lateral: 4.1 cm
Single Plane A2C EF: 54.7 %
Single Plane A4C EF: 58 %
Weight: 3052.8 oz

## 2022-03-28 LAB — GLUCOSE, CAPILLARY
Glucose-Capillary: 70 mg/dL (ref 70–99)
Glucose-Capillary: 100 mg/dL — ABNORMAL HIGH (ref 70–99)
Glucose-Capillary: 134 mg/dL — ABNORMAL HIGH (ref 70–99)
Glucose-Capillary: 180 mg/dL — ABNORMAL HIGH (ref 70–99)
Glucose-Capillary: 71 mg/dL (ref 70–99)

## 2022-03-28 MED ORDER — LATANOPROST 0.005 % OP SOLN
1.0000 [drp] | Freq: Every day | OPHTHALMIC | Status: DC
Start: 2022-03-28 — End: 2022-03-29
  Administered 2022-03-28 (×2): 1 [drp] via OPHTHALMIC
  Filled 2022-03-28: qty 2.5

## 2022-03-28 MED ORDER — ALLOPURINOL 100 MG PO TABS
200.0000 mg | ORAL_TABLET | Freq: Every day | ORAL | Status: DC
  Administered 2022-03-28 – 2022-03-29 (×2): 200 mg via ORAL
  Filled 2022-03-28 (×2): qty 2

## 2022-03-28 MED ORDER — INSULIN PUMP
Freq: Three times a day (TID) | SUBCUTANEOUS | Status: DC
Start: 2022-03-28 — End: 2022-03-29
  Administered 2022-03-28 – 2022-03-29 (×2): 3 via SUBCUTANEOUS
  Filled 2022-03-28: qty 1

## 2022-03-28 MED ORDER — FUROSEMIDE 10 MG/ML IJ SOLN
40.0000 mg | Freq: Every evening | INTRAMUSCULAR | Status: AC
  Administered 2022-03-28: 40 mg via INTRAVENOUS
  Filled 2022-03-28: qty 4

## 2022-03-28 MED ORDER — INSULIN ASPART 100 UNIT/ML IJ SOLN: 0.0000 [IU] | Freq: Three times a day (TID) | INTRAMUSCULAR | Status: DC

## 2022-03-28 MED ORDER — AMLODIPINE BESYLATE 10 MG PO TABS
10.0000 mg | ORAL_TABLET | Freq: Every day | ORAL | Status: DC
  Administered 2022-03-28: 10 mg via ORAL
  Filled 2022-03-28: qty 1

## 2022-03-28 MED ORDER — SERTRALINE HCL 100 MG PO TABS
100.0000 mg | ORAL_TABLET | Freq: Every day | ORAL | Status: DC
  Administered 2022-03-28 – 2022-03-29 (×2): 100 mg via ORAL
  Filled 2022-03-28 (×2): qty 1

## 2022-03-28 MED ORDER — ORAL CARE MOUTH RINSE: 15.0000 mL | OROMUCOSAL | Status: DC | PRN

## 2022-03-28 MED ORDER — CLONIDINE HCL 0.1 MG PO TABS
0.1000 mg | ORAL_TABLET | Freq: Two times a day (BID) | ORAL | Status: DC
  Administered 2022-03-28 – 2022-03-29 (×4): 0.1 mg via ORAL
  Filled 2022-03-28 (×4): qty 1

## 2022-03-28 MED ORDER — HYDRALAZINE HCL 50 MG PO TABS
50.0000 mg | ORAL_TABLET | Freq: Three times a day (TID) | ORAL | Status: DC
  Administered 2022-03-28 (×2): 50 mg via ORAL
  Filled 2022-03-28 (×2): qty 1

## 2022-03-28 MED ORDER — ASPIRIN 81 MG PO TBEC
81.0000 mg | DELAYED_RELEASE_TABLET | Freq: Every day | ORAL | Status: DC
  Administered 2022-03-28 – 2022-03-29 (×2): 81 mg via ORAL
  Filled 2022-03-28 (×2): qty 1

## 2022-03-28 MED ORDER — ATORVASTATIN CALCIUM 40 MG PO TABS
40.0000 mg | ORAL_TABLET | Freq: Every day | ORAL | Status: DC
  Administered 2022-03-28 – 2022-03-29 (×2): 40 mg via ORAL
  Filled 2022-03-28 (×2): qty 1

## 2022-03-28 MED ORDER — LIVING BETTER WITH HEART FAILURE BOOK: Freq: Once | Status: AC

## 2022-03-28 MED ORDER — METOPROLOL TARTRATE 50 MG PO TABS
50.0000 mg | ORAL_TABLET | Freq: Two times a day (BID) | ORAL | Status: DC
  Administered 2022-03-28 – 2022-03-29 (×4): 50 mg via ORAL
  Filled 2022-03-28 (×4): qty 1

## 2022-03-28 MED ORDER — FUROSEMIDE 40 MG PO TABS: 40.0000 mg | ORAL_TABLET | Freq: Every day | ORAL | Status: DC

## 2022-03-28 MED ORDER — HYDRALAZINE HCL 50 MG PO TABS
100.0000 mg | ORAL_TABLET | Freq: Three times a day (TID) | ORAL | Status: DC
  Administered 2022-03-28 – 2022-03-29 (×3): 100 mg via ORAL
  Filled 2022-03-28 (×3): qty 2

## 2022-03-28 MED ORDER — TAMSULOSIN HCL 0.4 MG PO CAPS
0.4000 mg | ORAL_CAPSULE | Freq: Every day | ORAL | Status: DC
  Administered 2022-03-28: 0.4 mg via ORAL
  Filled 2022-03-28: qty 1

## 2022-03-28 MED ORDER — ISOSORBIDE MONONITRATE ER 60 MG PO TB24
120.0000 mg | ORAL_TABLET | Freq: Every day | ORAL | Status: DC
  Administered 2022-03-28 – 2022-03-29 (×2): 120 mg via ORAL
  Filled 2022-03-28 (×2): qty 2

## 2022-03-28 NOTE — Consult Note (Addendum)
Cardiology Consultation:   Patient ID: Alec [name] MRN: [id number]; DOB: 06-13-1946  Admit date: 03/27/2022 Date of Consult: 03/28/2022  PCP:  Mayra Neer, MD   [facility] Providers Cardiologist:  Minus Breeding, MD        Patient Profile:   Alec [name] is a 76 y.o. male with a hx of CAD s/p CABGx3 in 2019 (LIMA-LAD, reverse SVG-intermediate, reverse SVG-PDA), possible HFpEF, mild-moderate mitral regurgitation, mild AI, anxiety, arthritis, CKD stage IV, bladder CA s/p TURBT, prostate CA, HTN, HLD, IDDM, RA, chronic appearing anemia/thrombocytopenia, OSA, remote tobacco use who is being seen 03/28/2022 for the evaluation of CHF at the request of Dr. Dyann Kief.  History of Present Illness:   [name] has history of CABG in 2019 as above. Per review of Dr. Rosezella Florida notes, in January 2023, he had SOB with mildly elevated BNP. Echo showed EF 55-60%, mild LVH, g2DD, normal RV, mildly enlarged RV, normal PASP, mild BAE, mild-moderate MR, mild AI. In last visit 11/2021 Dr. Percival Spanish had been considering further testing such as a catheterization as the patient reported symptoms were consistent with pre-bypass symptoms. However, his breathing then began to improve, only occurring with higher levels of exertion, and medical therapy was recommended with increase in Imdur. Dr. Percival Spanish recommended high threshold for any invasive eval given renal insufficiency. The patient follows with nephrologist Dr. Posey Pronto. He has not previously required diuretic therapy.  He presented to [place] yesterday with 2 week history of worsening exertional dyspnea which eventually progressed over the last several days as orthopnea accompanied by wheezing. He denies any overt chest pain though would have a sense of tightness when the dyspnea occurred. He was unable to sleep well the last few nights due to the dyspnea so came into the ER. Labs notable for Hgb 9.8 (down from 11-23 range earlier this year), plt 115  (prev 120s), hsTroponin 19->22, BNP 1019, Cr 2.46->2.52 c/w prior values (2.86 in 12/2021). EKG SB HR 58bpm with nonspecific TW changes similar to prior. CXR showed central pulmonary vessels are prominent suggesting mild CHF, small bilateral pleural effusions. He received 39m IV Lasix yesterday and this morning with excellent UOP per patient report. Weight 201.7->192.4->190.8 which is below prior OP weight (203lb in 11/2021). He is now able to lie flat. He still had some wheezing this AM but that has improved this afternoon. He has a history of mild LE edema that had not been particularly different recently. Repeat echo is pending. Does report increase in sodium intake over the last few weeks, wife citing example that he loves hamburgers.   Past Medical History:  Diagnosis Date   Anemia    Anxiety    Aortic insufficiency    Arthritis    Bilateral lower extremity edema    Chronic gout    06-19-2020  per pt last episode 6 months , great toe   CKD (chronic kidney disease), stage IV ([facility]    Coronary artery disease cardiologist--- dr hochrein   CABG 2019   Degenerative arthritis of shoulder region 08/2013   left   History of bladder cancer 07/2008   s/p  TURBT   History of cellulitis 11/2017   left upper arm   History of kidney stones    Hyperlipidemia    Hypertension    followed by pcp/ cardiology   IDDM (insulin dependent diabetes mellitus)    endocrinologist--- dr kDwyane Dee--  pt uses insulin pump and dexcom  (06-19-2020 per pt fasting sugar-- 98--110)  Insulin pump in place    Mitral regurgitation    OSA (obstructive sleep apnea)    Prostate cancer [facility]) urologist--- dr herrick/  oncologist--- [place]   dx 11/ 2017  Gleason 3+3 active survillance;  bx 03-14-2020 Gleason 3+4   RA (rheumatoid arthritis) ([place])    rhemotologist--- dr Normajean Glasgow---     S/P CABG x 3 07/15/2018   LIMA to LAD;  SVG to PDA;  SVG to RI   Thrombocytopenia [facility])     Past Surgical History:  Procedure  Laterality Date   CHOLECYSTECTOMY  08/29/2012   Procedure: LAPAROSCOPIC CHOLECYSTECTOMY WITH INTRAOPERATIVE CHOLANGIOGRAM;  Surgeon: Imogene Burn. Tsuei, MD;  Location: [facility];  Service: General;  Laterality: N/A;   CORONARY ARTERY BYPASS GRAFT N/A 07/20/2018   Procedure: CORONARY ARTERY BYPASS GRAFTING (CABG) times three using left internal mammary artery to the LAD, and using endoscopically harvested right saphenous vein to PDA and intermedius.;  Surgeon: Grace Isaac, MD;  Location: [place];  Service: Open Heart Surgery;  Laterality: N/A;   LEFT HEART CATH AND CORONARY ANGIOGRAPHY N/A 07/15/2018   Procedure: LEFT HEART CATH AND CORONARY ANGIOGRAPHY;  Surgeon: Leonie Man, MD;  Location: [place] [facility];  Service: Cardiovascular;  Laterality: N/A;   RADIOACTIVE SEED IMPLANT N/A 06/21/2020   Procedure: RADIOACTIVE SEED IMPLANT/BRACHYTHERAPY IMPLANT;  Surgeon: Ardis Hughs, MD;  Location: [facility];  Service: Urology;  Laterality: N/A;   SHOULDER ARTHROSCOPY W/ ROTATOR CUFF REPAIR Right 09/03/2013   SHOULDER ARTHROSCOPY WITH SUBACROMIAL DECOMPRESSION, ROTATOR CUFF REPAIR AND BICEP TENDON REPAIR Left 09/03/2013   Procedure: LEFT SHOULDER ARTHROSCOPY WITH EXTENSIVE DEBRIDMENT, DISTAL CLAVICULECTOMY, ROTATOR CUFF REPAIR AND SUBACROMIAL DECOMPRESSION PARTIAL ACRIOMIOPLASTY WITH CORACOACROMIAL RELEASE;  Surgeon: Renette Butters, MD;  Location: [place];  Service: Orthopedics;  Laterality: Left;   SPACE OAR INSTILLATION N/A 06/21/2020   Procedure: SPACE OAR INSTILLATION;  Surgeon: Ardis Hughs, MD;  Location: [facility];  Service: Urology;  Laterality: N/A;   TEE WITHOUT CARDIOVERSION N/A 07/20/2018   Procedure: TRANSESOPHAGEAL ECHOCARDIOGRAM (TEE);  Surgeon: Grace Isaac, MD;  Location: [place];  Service: Open Heart Surgery;  Laterality: N/A;   TRANSURETHRAL RESECTION OF BLADDER TUMOR WITH MITOMYCIN-C  08/24/2008   _0       Home Medications:  Prior to Admission medications   Medication Sig Start Date End Date Taking? Authorizing Provider  allopurinol (ZYLOPRIM) 100 MG tablet Take 200 mg by mouth daily.    Yes [provider]  amLODipine (NORVASC) 10 MG tablet Take 1 tablet (10 mg total) by mouth daily. **Patient need a appointment for future refill** 09/18/21  Yes Minus Breeding, MD  aspirin EC 81 MG tablet Take 81 mg by mouth daily.   Yes [provider]  atorvastatin (LIPITOR) 40 MG tablet Take 1 tablet (40 mg total) by mouth daily. 11/03/20  Yes Lendon Colonel, NP  Boswellia-Glucosamine-Vit D (OSTEO BI-FLEX ONE PER DAY PO) Take 1 tablet by mouth daily.   Yes [provider]  Cholecalciferol (VITAMIN D3 PO) Take 1 capsule by mouth daily.   Yes [provider]  CINNAMON PO Take 1 capsule by mouth daily.   Yes [provider]  cloNIDine (CATAPRES) 0.1 MG tablet Take 1 tablet (0.1 mg total) by mouth 2 (two) times daily. 02/28/21  Yes Minus Breeding, MD  hydrALAZINE (APRESOLINE) 50 MG tablet TAKE 1 TABLET BY MOUTH THREE TIMES A DAY Patient taking differently: Take 50 mg by mouth 2 (two)  times daily. 02/28/22  Yes Minus Breeding, MD  insulin aspart (NOVOLOG) 100 UNIT/ML injection Use max of 85 units daily via insulin pump Patient taking differently: Inject 50-85 Units into the skin daily. via insulin pump 01/03/22  Yes Elayne Snare, MD  isosorbide mononitrate (IMDUR) 120 MG 24 hr tablet Take 1 tablet (120 mg total) by mouth daily. Patient taking differently: Take 60 mg by mouth daily. 12/20/21 12/15/22 Yes Hochrein, Jeneen Rinks, MD  latanoprost (XALATAN) 0.005 % ophthalmic solution Place 1 drop into both eyes at bedtime. 09/18/21  Yes [provider]  metoprolol tartrate (LOPRESSOR) 50 MG tablet TAKE 1 TABLET BY MOUTH TWICE A DAY Patient taking differently: Take 50 mg by mouth 2 (two) times daily. 06/11/21  Yes Minus Breeding, MD  Misc Natural Products (TART CHERRY  ADVANCED PO) Take 1 tablet by mouth daily.   Yes [provider]  Multiple Vitamins-Minerals (CENTRUM SILVER PO) Take 1 tablet by mouth daily.   Yes [provider]  nitroGLYCERIN (NITROSTAT) 0.4 MG SL tablet Place 1 tablet (0.4 mg total) under the tongue every 5 (five) minutes as needed for chest pain. 10/26/21 03/27/22 Yes Hochrein, Jeneen Rinks, MD  olmesartan (BENICAR) 5 MG tablet Take 5 mg by mouth daily. 03/25/22  Yes [provider]  sertraline (ZOLOFT) 100 MG tablet Take 100 mg by mouth daily. 02/25/20  Yes [provider]  tamsulosin (FLOMAX) 0.4 MG CAPS capsule Take 0.4 mg by mouth at bedtime.   Yes [provider]  Blood Glucose Monitoring Suppl (FREESTYLE FREEDOM LITE) w/Device KIT Use to check blood sugar three times daily. DX:E11.65 02/02/20   Elayne Snare, MD    Inpatient Medications: Scheduled Meds:  allopurinol  200 mg Oral Daily   aspirin EC  81 mg Oral Daily   atorvastatin  40 mg Oral Daily   cloNIDine  0.1 mg Oral BID   furosemide  40 mg Intravenous Daily   heparin  5,000 Units Subcutaneous Q8H   hydrALAZINE  100 mg Oral Q8H   insulin pump   Subcutaneous TID WC, HS, 0200   isosorbide mononitrate  120 mg Oral Daily   latanoprost  1 drop Both Eyes QHS   metoprolol tartrate  50 mg Oral BID   sertraline  100 mg Oral Daily   tamsulosin  0.4 mg Oral QPC supper   Continuous Infusions:  PRN Meds: mouth rinse  Allergies:    Allergies  Allergen Reactions   Nsaids Other (See Comments)    Told to avoid by dr     Social History:   Social History   Socioeconomic History   Marital status: Married    Spouse name: Not on file   Number of children: Not on file   Years of education: Not on file   Highest education level: Not on file  Occupational History   Not on file  Tobacco Use   Smoking status: Former    Packs/day: 2.00    Years: 25.00    Total pack years: 50.00    Types: Cigarettes    Quit date: 09/11/1997    Years since  quitting: 24.5   Smokeless tobacco: Never  Vaping Use   Vaping Use: Never used  Substance and Sexual Activity   Alcohol use: Not Currently   Drug use: Never   Sexual activity: Yes  Other Topics Concern   Not on file  Social History Narrative   Not on file   Social Determinants of Health   Financial Resource Strain: Not on  file  Food Insecurity: Not on file  Transportation Needs: Not on file  Physical Activity: Not on file  Stress: Not on file  Social Connections: Not on file  Intimate Partner Violence: Not on file    Family History:    Family History  Problem Relation Age of Onset   Diabetes Mother    Lung cancer Mother    Cancer Father    Coronary artery disease Father        MI at age 57.    Lung cancer Father    Sudden death Son    CAD Maternal Grandfather        MI in his 23's   CAD Paternal Grandfather    Breast cancer Neg Hx    Colon cancer Neg Hx    Pancreatic cancer Neg Hx      ROS:  Please see the history of present illness.  All other ROS reviewed and negative.     Physical Exam/Data:   Vitals:   03/28/22 0335 03/28/22 0725 03/28/22 0844 03/28/22 1125  BP: (!) 155/67 (!) 162/62 (!) 145/67 (!) 126/56  Pulse: (!) 57 (!) 58  (!) 58  Resp:  18  (!) 8  Temp: 98.5 F (36.9 C) 98.6 F (37 C)  98 F (36.7 C)  TempSrc: Oral Oral  Oral  SpO2: 95% 92%  93%  Weight: 86.5 kg     Height:        Intake/Output Summary (Last 24 hours) at 03/28/2022 1407 Last data filed at 03/28/2022 1324 Gross per 24 hour  Intake 580 ml  Output 3675 ml  Net -3095 ml      03/28/2022    3:35 AM 03/27/2022    8:35 PM 03/27/2022   11:57 AM  Last 3 Weights  Weight (lbs) 190 lb 12.8 oz 192 lb 7.4 oz 201 lb 11.5 oz  Weight (kg) 86.546 kg 87.3 kg 91.5 kg     Body mass index is 30.8 kg/m.  General: Well developed, well nourished, in no acute distress. Head: Normocephalic, atraumatic, sclera non-icteric, no xanthomas, nares are without discharge. Neck: Negative for carotid  bruits. JVP not elevated. Lungs: Clear bilaterally to auscultation without wheezes, rales, or rhonchi. Breathing is unlabored. Heart: RRR S1 S2 without murmurs, rubs, or gallops.  Abdomen: Soft, non-tender, non-distended with normoactive bowel sounds. No rebound/guarding. Extremities: No clubbing or cyanosis. No edema. Distal pedal pulses are 2+ and equal bilaterally. Neuro: Alert and oriented X 3. Moves all extremities spontaneously. Psych:  Responds to questions appropriately with a normal affect.   EKG:  The EKG was personally reviewed and demonstrates:  SB 58bpm, first degree AV block, nonspecific TW changes I, avL, V4-V6 similar to prior   Telemetry:  Telemetry was personally reviewed and demonstrates:  SB 50s-60s  Relevant CV Studies:  2D echo 09/2021   1. Left ventricular ejection fraction, by estimation, is 55 to 60%. The  left ventricle has normal function. The left ventricle has no regional  wall motion abnormalities. There is mild left ventricular hypertrophy.  Left ventricular diastolic parameters  are consistent with Grade II diastolic dysfunction (pseudonormalization).  The average left ventricular global longitudinal strain is -18.5 %. The  global longitudinal strain is normal.   2. Right ventricular systolic function is normal. The right ventricular  size is mildly enlarged. There is normal pulmonary artery systolic  pressure.   3. Left atrial size was mildly dilated.   4. Right atrial size was mildly dilated.   5.  The mitral valve is normal in structure. Mild to moderate mitral valve  regurgitation. No evidence of mitral stenosis.   6. The aortic valve is calcified. There is mild calcification of the  aortic valve. There is mild thickening of the aortic valve. Aortic valve  regurgitation is mild. Aortic valve sclerosis/calcification is present,  without any evidence of aortic  stenosis.   7. Pulmonic valve regurgitation is moderate.   8. The inferior vena cava is  normal in size with greater than 50%  respiratory variability, suggesting right atrial pressure of 3 mmHg.   Comparison(s): No significant change from prior study. Prior images  reviewed side by side.   Event monitor 07/2019 NSR Brief runs of SVT Rare atrial and ventricular ectopy. No sustained arrhythmias.    Pre-Cabg dopplers 06/2018  Summary:  Right Carotid: The extracranial vessels were near-normal with only minimal  wall                 thickening or plaque.   Left Carotid: The extracranial vessels were near-normal with only minimal  wall                thickening or plaque.  Vertebrals:  Bilateral vertebral arteries demonstrate antegrade flow.  Subclavians: Normal flow hemodynamics were seen in bilateral subclavian               arteries.   Right ABI: Resting right ankle-brachial index indicates noncompressible  right lower extremity arteries.The right toe-brachial index is abnormal.  ABIs are unreliable.  Left ABI: Resting left ankle-brachial index indicates noncompressible left  lower extremity arteries.The left toe-brachial index is normal.  Right Upper Extremity: Doppler waveforms remain within normal limits with  right radial compression. Doppler waveforms remain within normal limits  with right ulnar compression.   Electronically signed by Monica Martinez MD on 07/16/2018 at 6:10:40 PM.  Final    Cardiac Cath 06/2018 LV Findings: The left ventricular systolic function is low-normal. The left ventricular ejection fraction is 50-55% by visual estimate. LV end diastolic pressure is moderately elevated. Angiographic Findings: Mid RCA-1 lesion is 40% stenosed. Mid RCA-2 lesion is 95% stenosed. Dist RCA lesion is 95% stenosed just at the takeoff of the RPAV. Mid LM to Prox LAD lesion is 50% stenosed. -Extensive long vessel that is calcified. Very small circumflex and ramus. Prox LAD lesion is 90% stenosed at takeoff of 1st Diag. Prox LAD to Mid LAD lesion is 80%  stenosed.   Severe multivessel disease with distal left main calcified 40 to 50% stenosis, proximal LAD tandem 90 and 80% lesions covering along area and including major first diagonal branch.  This is in conjunction with 2 significant mid and distal RCA lesions of 95% and then a 90% bifurcation lesion just prior to the takeoff of the PAB. Likely low normal EF but difficult to assess wall motion because of ectopy.  Recommend 2D echo prior to discharge. Moderate elevated LVEDP.   Based on the patient's anatomy, I think he is best served for CABG.  We will consult CT surgery. Given his reduced renal function, we will admit him for overnight observation for IV fluid hydration.   Would like to consider increasing Crestor dose on discharge. Would start antianginal medication if blood pressure room tolerates use amlodipine versus Imdur and provide Seldinger nitroglycerin as needed.   Recommend Aspirin 63m daily for severe multivessel CAD.    Laboratory Data:  High Sensitivity Troponin:   Recent Labs  Lab 03/27/22 1212 03/27/22 1446  TROPONINIHS 19* 22*     Chemistry Recent Labs  Lab 03/27/22 1211 03/28/22 0632  NA 136 140  K 3.8 4.0  CL 110 109  CO2 20* 23  GLUCOSE 209* 94  BUN 27* 27*  CREATININE 2.46* 2.52*  CALCIUM 8.9 9.2  GFRNONAA 27* 26*  ANIONGAP 6 8    No results for input(s): "PROT", "ALBUMIN", "AST", "ALT", "ALKPHOS", "BILITOT" in the last 168 hours. Lipids No results for input(s): "CHOL", "TRIG", "HDL", "LABVLDL", "LDLCALC", "CHOLHDL" in the last 168 hours.  Hematology Recent Labs  Lab 03/27/22 1211 03/28/22 0632  WBC 3.7* 3.4*  RBC 2.99* 3.00*  HGB 9.6* 9.8*  HCT 28.4* 28.2*  MCV 95.0 94.0  MCH 32.1 32.7  MCHC 33.8 34.8  RDW 14.6 14.5  PLT 120* 115*   Thyroid No results for input(s): "TSH", "FREET4" in the last 168 hours.  BNP Recent Labs  Lab 03/27/22 1211  BNP 1,019.1*    DDimer No results for input(s): "DDIMER" in the last 168  hours.   Radiology/Studies:  DG Chest 2 View  Result Date: 03/27/2022 CLINICAL DATA:  Shortness of breath EXAM: CHEST - 2 VIEW COMPARISON:  05/25/2020 FINDINGS: Transverse diameter of heart is slightly increased. There is evidence of previous coronary bypass surgery. Central pulmonary vessels are prominent. There are no signs of alveolar pulmonary edema or new focal infiltrates. There is minimal blunting of both posterior CP angles. There is no pneumothorax. IMPRESSION: Central pulmonary vessels are prominent suggesting mild CHF. Small bilateral pleural effusions. There are no signs of alveolar pulmonary edema or new focal infiltrates. Electronically Signed   By: Elmer Picker M.D.   On: 03/27/2022 12:31     Assessment and Plan:   1. Suspected acute on chronic HFpEF - symptoms most suggestive of heart failure as they significantly improved with IV Lasix and subsequent urine output - cannot fully exclude progression of CAD contributing to worsening diastolic dysfunction, but as stated in prior notes, high threshold to cath given CKD -> no evidence of ACS this admission - repeat echo pending - consult dietitian for low sodium diet education (consuming higher salt recently), Living Better with CHF booklet - will discuss strategy with MD  2. CAD s/p CABG 2019 with mildly elevated troponin - hsTroponin low/flat 19-22, EKG unchanged from prior - main complaint is DOE rather than anginal-type chest pain - will await echo for further recs - continue ASA, BB, statin  3. Essential HTN - BP was elevated at [place] yesterday with last value 126/56 - current rx includes Lasix 52m IV daily, clonidine 0.180mBID (home med),  hydralazine 10023mID (increased from home dose of 75m34mD), Imdur 120mg75mly (home dose), metoprolol 75mg 81m(home dose) - home amlodipine stopped due to lower extremity edema (received this AM), home olmesartan held due to renal insuffiency -> consider resuming  amlodipine at 5mg da34m if needed vs change metoprolol to carvedilol if need be - check thyroid in AM  4. Mild-moderate MR, mild AI by echo 09/2021 - repeat echo pending  5. CKD stage IV - follow renal function carefully with management above, Cr appears in line with prior values  6. Hyperlipidemia - last lipid panel 04/2021 with LDL 69, will repeat for AM here  7. Anemia/thrombocytopenia - no bleeding reported, further evaluation per primary team - has he been evaluated for myeloma before? Will defer to medicine team   Risk Assessment/Risk Scores:        New York Heart Association (NYHA)  Functional Class NYHA Class III-IV on arrival now class II     For questions or updates, please contact [place] Please consult [email or web address] for contact info under    Signed, Charlie Pitter, PA-C  03/28/2022 2:07 PM   Patient seen and examined and agree with Melina Copa, PA-C as detailed above.  In brief, the patient is a  76 y.o. male with a hx of CAD s/p CABGx3 in 2019 (LIMA-LAD, reverse SVG-intermediate, reverse SVG-PDA), possible HFpEF, mild-moderate mitral regurgitation, mild AI, anxiety, arthritis, CKD stage IV, bladder CA s/p TURBT, prostate CA, HTN, HLD, IDDM, RA, chronic appearing anemia/thrombocytopenia, OSA, remote tobacco use who presented to the ER with worsening dyspnea on exertion and orthopnea found to have BNP 1019 with pulmonary edema on CXR consistent with acute on chronic diastolic HF exacerbation for which Cardiology was consulted.  Patient presented with 2 week progressive dyspnea and orthopnea. Suspect this may be primarily dietary driven due to significant sodium intake. He denies any chest pain and trop flat 19>22 and ECG with no acute ischemic changes and lower suspicion for underlying ischemia. Has been responding very well to diuresis with significant improvement in symptoms today. TTE on my review with LVEF 55-60%, G2DD, mild MR, RAP 8. Will give one more dose of  IV lasix tonight and likely transition to PO tomorrow.   GEN: No acute distress.   Neck: No JVD Cardiac: RRR, soft systolic murmur at RUSB Respiratory: Clear to auscultation bilaterally. GI: Soft, nontender, non-distended  MS: No edema; No deformity. Neuro:  Nonfocal  Psych: Normal affect    Plan: -Give one more dose lasix 57m IV tonight and likely transition to PO lasix tomorrow -No evidence of ACS and given CKD and lack of anginal symptoms, no plan for ischemic work-up at this time -Continue ASA 860mdaily, lipitor 4072maily -On hydralazine 100m62mD, imdur 120mg39mly, clonidine 0.1mg B15m and metop 50mg B43mnd BP controlled; can continue to adjust as outpatient as well -Monitor I/Os and daily weights -Discussed low Na diet at length today as suspect this is a major contributor to his worsening volume status  Plan discussed with the patient and his wife at beside.  HeatherGwyndolyn Kaufman

## 2022-03-28 NOTE — Assessment & Plan Note (Addendum)
-  Reporting significant improvement in his breathing; denies chest pain, palpitations, orthopnea and wants to go home. -No requiring oxygen supplementation. -Good urine output reported. -Overdiuresis throughout hospitalization; creatinine up to 3.3.  Following cardiology recommendations we will provide Lasix holiday on 03/30/2022. -Continue daily weights, adequate hydration and low-sodium diet -Resume home antihypertensive medications except for Benicar due to worsening renal function.  Lasix 20 mg by mouth daily recommended at discharge.

## 2022-03-28 NOTE — H&P (Incomplete)
History and Physical    Patient: Alec [name] [id number]:[id number] DOB: 06-15-1946 DOA: 03/27/2022 DOS: the patient was seen and examined on 03/28/2022 PCP: Mayra Neer, MD  Patient coming from: {Point_of_Origin:[address]}  Chief Complaint:  Chief Complaint  Patient presents with  . Shortness of Breath   HPI: Alec [name] is a 76 y.o. male with medical history significant of ***  Review of Systems: {ROS_Text:26778} Past Medical History:  Diagnosis Date  . Anxiety   . Arthritis   . Bilateral lower extremity edema   . Chronic gout    06-19-2020  per pt last episode 6 months , great toe  . CKD (chronic kidney disease), stage III ([place])    followed by pcp  . Coronary artery disease cardiologist--- dr hochrein   a. nuclear study 04-25-2017 low risk b. ETT abnormal 07-07-2018  c.  cardiac cath 07-15-2018  severe multivessel disease   d.  CABG x3  . Degenerative arthritis of shoulder region 08/2013   left  . DOE (dyspnea on exertion)   . History of bladder cancer 07/2008   s/p  TURBT  . History of cellulitis 11/2017   left upper arm  . History of kidney stones   . Hyperlipidemia   . Hypertension    followed by pcp/ cardiology  . IDDM (insulin dependent diabetes mellitus)    endocrinologist--- dr Dwyane Dee---  pt uses insulin pump and dexcom  (06-19-2020 per pt fasting sugar-- 98--110)  . Insulin pump in place   . OSA (obstructive sleep apnea)   . Prostate cancer [facility]) urologist--- dr herrick/  oncologist--- [place]   dx 11/ 2017  Gleason 3+3 active survillance;  bx 03-14-2020 Gleason 3+4  . RA (rheumatoid arthritis) ([place])    rhemotologist--- dr Normajean Glasgow---    . S/P CABG x 3 07/15/2018   LIMA to LAD;  SVG to PDA;  SVG to RI   Past Surgical History:  Procedure Laterality Date  . CHOLECYSTECTOMY  08/29/2012   Procedure: LAPAROSCOPIC CHOLECYSTECTOMY WITH INTRAOPERATIVE CHOLANGIOGRAM;  Surgeon: Imogene Burn. Tsuei, MD;  Location: [facility];  Service: General;  Laterality: N/A;  .  CORONARY ARTERY BYPASS GRAFT N/A 07/20/2018   Procedure: CORONARY ARTERY BYPASS GRAFTING (CABG) times three using left internal mammary artery to the LAD, and using endoscopically harvested right saphenous vein to PDA and intermedius.;  Surgeon: Grace Isaac, MD;  Location: [place];  Service: Open Heart Surgery;  Laterality: N/A;  . LEFT HEART CATH AND CORONARY ANGIOGRAPHY N/A 07/15/2018   Procedure: LEFT HEART CATH AND CORONARY ANGIOGRAPHY;  Surgeon: Leonie Man, MD;  Location: [place] [facility];  Service: Cardiovascular;  Laterality: N/A;  . RADIOACTIVE SEED IMPLANT N/A 06/21/2020   Procedure: RADIOACTIVE SEED IMPLANT/BRACHYTHERAPY IMPLANT;  Surgeon: Ardis Hughs, MD;  Location: [facility];  Service: Urology;  Laterality: N/A;  . SHOULDER ARTHROSCOPY W/ ROTATOR CUFF REPAIR Right 09/03/2013  . SHOULDER ARTHROSCOPY WITH SUBACROMIAL DECOMPRESSION, ROTATOR CUFF REPAIR AND BICEP TENDON REPAIR Left 09/03/2013   Procedure: LEFT SHOULDER ARTHROSCOPY WITH EXTENSIVE DEBRIDMENT, DISTAL CLAVICULECTOMY, ROTATOR CUFF REPAIR AND SUBACROMIAL DECOMPRESSION PARTIAL ACRIOMIOPLASTY WITH CORACOACROMIAL RELEASE;  Surgeon: Renette Butters, MD;  Location: [place];  Service: Orthopedics;  Laterality: Left;  . SPACE OAR INSTILLATION N/A 06/21/2020   Procedure: SPACE OAR INSTILLATION;  Surgeon: Ardis Hughs, MD;  Location[facility];  Service: Urology;  Laterality: N/A;  . TEE WITHOUT CARDIOVERSION N/A 07/20/2018   Procedure: TRANSESOPHAGEAL ECHOCARDIOGRAM (TEE);  Surgeon: Lanelle Bal  B, MD;  Location: [place];  Service: Open Heart Surgery;  Laterality: N/A;  . TRANSURETHRAL RESECTION OF BLADDER TUMOR WITH MITOMYCIN-C  08/24/2008   _0    Social History:  reports that he quit smoking about 24 years ago. His smoking use included cigarettes. He has a 50.00 pack-year smoking history. He has never used smokeless tobacco. He reports that he does not  currently use alcohol. He reports that he does not use drugs.  Allergies  Allergen Reactions  . Nsaids Other (See Comments)    Told to avoid by dr     Family History  Problem Relation Age of Onset  . Diabetes Mother   . Lung cancer Mother   . Cancer Father   . Coronary artery disease Father        MI at age 83.   . Lung cancer Father   . Sudden death Son   . CAD Maternal Grandfather        MI in his 22's  . CAD Paternal Grandfather   . Breast cancer Neg Hx   . Colon cancer Neg Hx   . Pancreatic cancer Neg Hx     Prior to Admission medications   Medication Sig Start Date End Date Taking? Authorizing Provider  allopurinol (ZYLOPRIM) 100 MG tablet Take 200 mg by mouth daily.    Yes [provider]  amLODipine (NORVASC) 10 MG tablet Take 1 tablet (10 mg total) by mouth daily. **Patient need a appointment for future refill** 09/18/21  Yes Minus Breeding, MD  aspirin EC 81 MG tablet Take 81 mg by mouth daily.   Yes [provider]  atorvastatin (LIPITOR) 40 MG tablet Take 1 tablet (40 mg total) by mouth daily. 11/03/20  Yes Lendon Colonel, NP  Boswellia-Glucosamine-Vit D (OSTEO BI-FLEX ONE PER DAY PO) Take 1 tablet by mouth daily.   Yes [provider]  Cholecalciferol (VITAMIN D3 PO) Take 1 capsule by mouth daily.   Yes [provider]  CINNAMON PO Take 1 capsule by mouth daily.   Yes [provider]  cloNIDine (CATAPRES) 0.1 MG tablet Take 1 tablet (0.1 mg total) by mouth 2 (two) times daily. 02/28/21  Yes Minus Breeding, MD  hydrALAZINE (APRESOLINE) 50 MG tablet TAKE 1 TABLET BY MOUTH THREE TIMES A DAY Patient taking differently: Take 50 mg by mouth 3 (three) times daily. 02/28/22  Yes Minus Breeding, MD  insulin aspart (NOVOLOG) 100 UNIT/ML injection Use max of 85 units daily via insulin pump Patient taking differently: Inject 50-85 Units into the skin daily. via insulin pump 01/03/22  Yes Elayne Snare, MD  isosorbide mononitrate  (IMDUR) 120 MG 24 hr tablet Take 1 tablet (120 mg total) by mouth daily. 12/20/21 12/15/22 Yes Hochrein, Jeneen Rinks, MD  latanoprost (XALATAN) 0.005 % ophthalmic solution Place 1 drop into both eyes at bedtime. 09/18/21  Yes [provider]  metoprolol tartrate (LOPRESSOR) 50 MG tablet TAKE 1 TABLET BY MOUTH TWICE A DAY Patient taking differently: Take 50 mg by mouth 2 (two) times daily. 06/11/21  Yes Minus Breeding, MD  Misc Natural Products (TART CHERRY ADVANCED PO) Take 1 tablet by mouth daily.   Yes [provider]  Multiple Vitamins-Minerals (CENTRUM SILVER PO) Take 1 tablet by mouth daily.   Yes [provider]  nitroGLYCERIN (NITROSTAT) 0.4 MG SL tablet Place 1 tablet (0.4 mg total) under the tongue every 5 (five) minutes as needed for chest pain. 10/26/21 03/27/22 Yes Minus Breeding, MD  olmesartan [facility]) 5  MG tablet Take 5 mg by mouth daily. 03/25/22  Yes [provider]  sertraline (ZOLOFT) 100 MG tablet Take 100 mg by mouth daily. 02/25/20  Yes [provider]  Blood Glucose Monitoring Suppl (FREESTYLE FREEDOM LITE) w/Device KIT Use to check blood sugar three times daily. DX:E11.65 02/02/20   Elayne Snare, MD    Physical Exam: Vitals:   03/27/22 1800 03/27/22 1830 03/27/22 1900 03/27/22 2035  BP: (!) 165/65 (!) 180/72 (!) 175/67 (!) 168/68  Pulse: (!) 53 (!) 53 (!) 55 64  Resp: _0 Temp:   97.8 F (36.6 C) 98.8 F (37.1 C)  TempSrc:    Oral  SpO2: 97% 95% 95% 97%  Weight:    87.3 kg  Height:    _1  (1.676 m)   *** Data Reviewed: {Tip this will not be part of the note when signed- Document your independent interpretation of telemetry tracing, EKG, lab, Radiology test or any other diagnostic tests. Add any new diagnostic test ordered today. (Optional):26781} {Results:26384}  Assessment and Plan: No notes have been filed under this hospital service. Service: Hospitalist     Advance Care Planning:   Code Status: Full Code  ***  Consults: ***  Family Communication: ***  Severity of Illness: {Observation/Inpatient:21159}  Author: Orene Desanctis, DO 03/28/2022 12:03 AM  For on call review [email or web address].

## 2022-03-28 NOTE — Assessment & Plan Note (Addendum)
-   Stable overall; appears to be chronic. -Continue outpatient follow-up with cardiology service.

## 2022-03-28 NOTE — Assessment & Plan Note (Addendum)
-  AKI on CKD stage 3b at baseline,  in the setting of diabetes. -AKI Most likely in the setting of continued use of nephrotoxic agents, diuresis and decreased perfusion with acute CHF exacerbation. -Holding Lasix until 03/30/2022 due to worsening renal function -Continue minimizing nephrotoxic agent -Advised to maintain adequate hydration -Follow-up daily weights and repeat blood work on 04/01/2022.

## 2022-03-28 NOTE — Progress Notes (Signed)
Progress Note   Patient: Alec [name] UYQ:[id number] DOB: May 06, 1946 DOA: 03/27/2022     1 DOS: the patient was seen and examined on 03/28/2022   Brief hospital course: As per H&P written by Dr. Flossie Buffy on 03/27/2022 Alec [name] is a 76 y.o. male with medical history significant of CAD s/p CABG x3, chronic bradycardia, chronic diastolic heart failure, CKD stage IIIb, insulin-dependent type 2 diabetes, hypertension, rheumatoid arthritis, prostate cancer in remission who presents from outside ED with concerns of CHF exacerbation.   He reports that for the past 2 weeks he has been noting increasing shortness of breath at rest and with exertion.  Has paroxysmal nocturnal dyspnea.  He has chronic issues with shortness of breath followed by cardiology but reports this has been worse.  Denies any lower extremity edema.  No chest pain.  Has noted sometimes productive cough with wheezing.  Has past remote history of tobacco use.  No fever.  Has been diligent with taking his medication.   In the ED, he was afebrile and bradycardic with mild hypertension with BP of 160/60 on room air.  BNP elevated at 1019.  Troponin of 19 and 22.  Chest x-ray showing prominent central pulmonary vessel consistent with CHF. EKG on my review showed nonspecific finding of inverted T waves at V5 and V6 but otherwise no significant ST changes.  Has mild leukopenia of 3.7, hemoglobin of 9.6 from a prior of 11.5.  Chronic thrombocytopenia of stable at 120.  Acute on chronic CKD with creatinine of 2.46 with prior around 1.9.   He was given IV 40 mg Lasix and transferred here to Alec [name] for further management.  Assessment and Plan: * Acute on chronic diastolic CHF (congestive heart failure) (HCC) -Reporting improvement in his breathing; still short winded with activity. -No requiring oxygen supplementation. -Good urine output appreciated. -Continue IV diuretics -Close monitoring of electrolytes and renal function -Follow-up  echo results and cardiology service recommendations. -Patient denies chest pain. -Repeat chest x-ray in a.m.      Acute-on-chronic kidney injury ([place]) -AKI on CKD stage 3b; in the setting of diabetes. -Most likely in the setting of continued use of nephrotoxic agents and decreased perfusion with acute CHF exacerbation. -Continue IV diuresis; follow I's and O's and renal function trend. -Minimize/avoid as much as possible nephrotoxic agents and hypotension. -Creatinine currently 2.5 -Creatinine elevated at 2.46.  Unclear baseline but prior creatinine around 1.9--2.3. -continue holding ARB. -follow trend   Bradycardia - Stable overall; appears to be chronic. -Cardiology consulted and will follow any recommendations.  S/P CABG x 3 - Patient currently denying chest pain -Cardiology has been consulted -Continue telemetry monitoring -Continue the use of aspirin, metoprolol and statins -Follow clinical response and further recommendation.  Essential hypertension -Continue the use of clonidine, metoprolol, Imdur and adjusted dose of hydralazine. -Holding losartan due to acute kidney injury and also amlodipine as these can increase swelling in his legs.   -Continue also IV Lasix. -Follow-up vital signs.    Dyslipidemia, goal LDL below 70 - Heart healthy diet discussed with patient Continue statin.  Type 2 diabetes mellitus with chronic kidney disease, with long-term current use of insulin ([place]) -Patient follows with endocrinology service as an outpatient -Most recent A1c 5.8 -Continue insulin pump, sliding scale insulin and CBGs to follow blood sugar fluctuation. -Modified carbohydrate diet discussed with patient.   Subjective:  No fever, no chest pain, no nausea, vomiting.  Reports no chest pain.  Still short winded with activity.  No requiring oxygen supplementation at rest. Positive trace edema in his LE's seen.  Physical Exam: Vitals:   03/28/22 0335 03/28/22 0725  03/28/22 0844 03/28/22 1125  BP: (!) 155/67 (!) 162/62 (!) 145/67 (!) 126/56  Pulse: (!) 57 (!) 58  (!) 58  Resp:  18  (!) 8  Temp: 98.5 F (36.9 C) 98.6 F (37 C)  98 F (36.7 C)  TempSrc: Oral Oral  Oral  SpO2: 95% 92%  93%  Weight: 86.5 kg     Height:       General exam: Alert, awake, oriented x 3; reports some improvement in his breathing at rest; still short winded with activity.  No chest pain, no nausea, no vomiting, no fever. Respiratory system: Positive scattered rhonchi; no crackles, no rales, no using accessory muscles.  Good saturation on room air. Cardiovascular system:Rate controlled, no rubs, no gallops, Gastrointestinal system: Abdomen is nondistended, soft and nontender. No organomegaly or masses felt. Normal bowel sounds heard. Central nervous system: Alert and oriented. No focal neurological deficits. Extremities: No cyanosis or clubbing; seen.  Bilateral appreciated. Skin: No rashes, no petechiae. Psychiatry: Judgement and insight appear normal. Mood & affect appropriate.   Data Reviewed: 2D echo: Pending Basic metabolic panel: Sodium 505, potassium 4.0, chloride 109; CO2 23, BUN 27, creatinine 2.52 and GFR 26. CBC: White blood cell 3.4, hemoglobin 9.8 and platelet count 115 K CBG's in the 100-130 range  Family Communication: Wife at bedside.  Disposition: Status is: Inpatient Remains inpatient appropriate because: Still short winded, receiving IV Lasix and pending cardiology evaluation for concerns of angina equivalent.  Follow 2D echo.   Planned Discharge Destination: Home   Author: Barton Dubois, MD 03/28/2022 1:29 PM  For on call review [email or web address].

## 2022-03-28 NOTE — Assessment & Plan Note (Addendum)
-  Patient currently denying chest pain -Cardiology has been consulted -No acute ischemic changes appreciated on telemetry or EKG. -Continue the use of aspirin, metoprolol and statins -2D echo reassuring demonstrating normal wall motion abnormalities and preserved ejection fraction.  Grade 2 diastolic dysfunction with pseudonormalization appreciated.

## 2022-03-28 NOTE — Assessment & Plan Note (Addendum)
-  Heart healthy diet discussed with patient -Continue statin 

## 2022-03-28 NOTE — H&P (Signed)
History and Physical    Patient: Alec [name] DOB: 04-17-1946 DOA: 03/27/2022 DOS: the patient was seen and examined on 03/28/2022 PCP: Mayra Neer, MD  Patient coming from: [facility]   Chief Complaint:  Chief Complaint  Patient presents with   Shortness of Breath   HPI: Alec [name] is a 76 y.o. male with medical history significant of CAD s/p CABG x3, chronic bradycardia, chronic diastolic heart failure, CKD stage IIIb, insulin-dependent type 2 diabetes, hypertension, rheumatoid arthritis, prostate cancer in remission who presents from outside ED with concerns of CHF exacerbation.  He reports that for the past 2 weeks he has been noting increasing shortness of breath at rest and with exertion.  Has paroxysmal nocturnal dyspnea.  He has chronic issues with shortness of breath followed by cardiology but reports this has been worse.  Denies any lower extremity edema.  No chest pain.  Has noted sometimes productive cough with wheezing.  Has past remote history of tobacco use.  No fever.  Has been diligent with taking his medication.  In the ED, he was afebrile and bradycardic with mild hypertension with BP of 160/60 on room air.  BNP elevated at 1019.  Troponin of 19 and 22.  Chest x-ray showing prominent central pulmonary vessel consistent with CHF. EKG on my review showed nonspecific finding of inverted T waves at V5 and V6 but otherwise no significant ST changes.  Has mild leukopenia of 3.7, hemoglobin of 9.6 from a prior of 11.5.  Chronic thrombocytopenia of stable at 120.  Acute on chronic CKD with creatinine of 2.46 with prior around 1.9.  He was given IV 40 mg Lasix and transferred here to [name] for further management.  Review of Systems: As mentioned in the history of present illness. All other systems reviewed and are negative. Past Medical History:  Diagnosis Date   Anxiety    Arthritis    Bilateral lower extremity edema    Chronic gout     06-19-2020  per pt last episode 6 months , great toe   CKD (chronic kidney disease), stage III ([place])    followed by pcp   Coronary artery disease cardiologist--- dr hochrein   a. nuclear study 04-25-2017 low risk b. ETT abnormal 07-07-2018  c.  cardiac cath 07-15-2018  severe multivessel disease   d.  CABG x3   Degenerative arthritis of shoulder region 08/2013   left   DOE (dyspnea on exertion)    History of bladder cancer 07/2008   s/p  TURBT   History of cellulitis 11/2017   left upper arm   History of kidney stones    Hyperlipidemia    Hypertension    followed by pcp/ cardiology   IDDM (insulin dependent diabetes mellitus)    endocrinologist--- dr Dwyane Dee---  pt uses insulin pump and dexcom  (06-19-2020 per pt fasting sugar-- 98--110)   Insulin pump in place    OSA (obstructive sleep apnea)    Prostate cancer [facility]) urologist--- dr herrick/  oncologist--- [place]   dx 11/ 2017  Gleason 3+3 active survillance;  bx 03-14-2020 Gleason 3+4   RA (rheumatoid arthritis) ([place])    rhemotologist--- dr Normajean Glasgow---     S/P CABG x 3 07/15/2018   LIMA to LAD;  SVG to PDA;  SVG to RI   Past Surgical History:  Procedure Laterality Date   CHOLECYSTECTOMY  08/29/2012   Procedure: LAPAROSCOPIC CHOLECYSTECTOMY WITH INTRAOPERATIVE CHOLANGIOGRAM;  Surgeon: Imogene Burn. Georgette Dover, MD;  Location:  [facility];  Service: General;  Laterality: N/A;   CORONARY ARTERY BYPASS GRAFT N/A 07/20/2018   Procedure: CORONARY ARTERY BYPASS GRAFTING (CABG) times three using left internal mammary artery to the LAD, and using endoscopically harvested right saphenous vein to PDA and intermedius.;  Surgeon: Grace Isaac, MD;  Location: [place];  Service: Open Heart Surgery;  Laterality: N/A;   LEFT HEART CATH AND CORONARY ANGIOGRAPHY N/A 07/15/2018   Procedure: LEFT HEART CATH AND CORONARY ANGIOGRAPHY;  Surgeon: Leonie Man, MD;  Location: [place] [facility];  Service: Cardiovascular;  Laterality: N/A;   RADIOACTIVE  SEED IMPLANT N/A 06/21/2020   Procedure: RADIOACTIVE SEED IMPLANT/BRACHYTHERAPY IMPLANT;  Surgeon: Ardis Hughs, MD;  Location: [facility];  Service: Urology;  Laterality: N/A;   SHOULDER ARTHROSCOPY W/ ROTATOR CUFF REPAIR Right 09/03/2013   SHOULDER ARTHROSCOPY WITH SUBACROMIAL DECOMPRESSION, ROTATOR CUFF REPAIR AND BICEP TENDON REPAIR Left 09/03/2013   Procedure: LEFT SHOULDER ARTHROSCOPY WITH EXTENSIVE DEBRIDMENT, DISTAL CLAVICULECTOMY, ROTATOR CUFF REPAIR AND SUBACROMIAL DECOMPRESSION PARTIAL ACRIOMIOPLASTY WITH CORACOACROMIAL RELEASE;  Surgeon: Renette Butters, MD;  Location: [facility];  Service: Orthopedics;  Laterality: Left;   SPACE OAR INSTILLATION N/A 06/21/2020   Procedure: SPACE OAR INSTILLATION;  Surgeon: Ardis Hughs, MD;  Location: [facility];  Service: Urology;  Laterality: N/A;   TEE WITHOUT CARDIOVERSION N/A 07/20/2018   Procedure: TRANSESOPHAGEAL ECHOCARDIOGRAM (TEE);  Surgeon: Grace Isaac, MD;  Location: [place];  Service: Open Heart Surgery;  Laterality: N/A;   TRANSURETHRAL RESECTION OF BLADDER TUMOR WITH MITOMYCIN-C  08/24/2008   _0    Social History:  reports that he quit smoking about 24 years ago. His smoking use included cigarettes. He has a 50.00 pack-year smoking history. He has never used smokeless tobacco. He reports that he does not currently use alcohol. He reports that he does not use drugs.  Allergies  Allergen Reactions   Nsaids Other (See Comments)    Told to avoid by dr     Family History  Problem Relation Age of Onset   Diabetes Mother    Lung cancer Mother    Cancer Father    Coronary artery disease Father        MI at age 21.    Lung cancer Father    Sudden death Son    CAD Maternal Grandfather        MI in his 13's   CAD Paternal Grandfather    Breast cancer Neg Hx    Colon cancer Neg Hx    Pancreatic cancer Neg Hx     Prior to Admission medications   Medication Sig Start  Date End Date Taking? Authorizing Provider  allopurinol (ZYLOPRIM) 100 MG tablet Take 200 mg by mouth daily.    Yes [provider]  amLODipine (NORVASC) 10 MG tablet Take 1 tablet (10 mg total) by mouth daily. **Patient need a appointment for future refill** 09/18/21  Yes Minus Breeding, MD  aspirin EC 81 MG tablet Take 81 mg by mouth daily.   Yes [provider]  atorvastatin (LIPITOR) 40 MG tablet Take 1 tablet (40 mg total) by mouth daily. 11/03/20  Yes Lendon Colonel, NP  Boswellia-Glucosamine-Vit D (OSTEO BI-FLEX ONE PER DAY PO) Take 1 tablet by mouth daily.   Yes [provider]  Cholecalciferol (VITAMIN D3 PO) Take 1 capsule by mouth daily.   Yes [provider]  CINNAMON PO Take 1 capsule by mouth daily.   Yes  [provider]  cloNIDine (CATAPRES) 0.1 MG tablet Take 1 tablet (0.1 mg total) by mouth 2 (two) times daily. 02/28/21  Yes Minus Breeding, MD  hydrALAZINE (APRESOLINE) 50 MG tablet TAKE 1 TABLET BY MOUTH THREE TIMES A DAY Patient taking differently: Take 50 mg by mouth 3 (three) times daily. 02/28/22  Yes Minus Breeding, MD  insulin aspart (NOVOLOG) 100 UNIT/ML injection Use max of 85 units daily via insulin pump Patient taking differently: Inject 50-85 Units into the skin daily. via insulin pump 01/03/22  Yes Elayne Snare, MD  isosorbide mononitrate (IMDUR) 120 MG 24 hr tablet Take 1 tablet (120 mg total) by mouth daily. 12/20/21 12/15/22 Yes Hochrein, Jeneen Rinks, MD  latanoprost (XALATAN) 0.005 % ophthalmic solution Place 1 drop into both eyes at bedtime. 09/18/21  Yes [provider]  metoprolol tartrate (LOPRESSOR) 50 MG tablet TAKE 1 TABLET BY MOUTH TWICE A DAY Patient taking differently: Take 50 mg by mouth 2 (two) times daily. 06/11/21  Yes Minus Breeding, MD  Misc Natural Products (TART CHERRY ADVANCED PO) Take 1 tablet by mouth daily.   Yes [provider]  Multiple Vitamins-Minerals (CENTRUM SILVER PO) Take 1 tablet  by mouth daily.   Yes [provider]  nitroGLYCERIN (NITROSTAT) 0.4 MG SL tablet Place 1 tablet (0.4 mg total) under the tongue every 5 (five) minutes as needed for chest pain. 10/26/21 03/27/22 Yes Hochrein, Jeneen Rinks, MD  olmesartan (BENICAR) 5 MG tablet Take 5 mg by mouth daily. 03/25/22  Yes [provider]  sertraline (ZOLOFT) 100 MG tablet Take 100 mg by mouth daily. 02/25/20  Yes [provider]  Blood Glucose Monitoring Suppl (FREESTYLE FREEDOM LITE) w/Device KIT Use to check blood sugar three times daily. DX:E11.65 02/02/20   Elayne Snare, MD    Physical Exam: Vitals:   03/27/22 1830 03/27/22 1900 03/27/22 2035 03/28/22 0018  BP: (!) 180/72 (!) 175/67 (!) 168/68 (!) 152/58  Pulse: (!) 53 (!) 55 64 (!) 58  Resp:  _0 Temp:  97.8 F (36.6 C) 98.8 F (37.1 C) 98.1 F (36.7 C)  TempSrc:   Oral Oral  SpO2: 95% 95% 97% 92%  Weight:   87.3 kg   Height:   _1  (1.676 m)    Constitutional: NAD, calm, comfortable, elderly male appearing younger than stated age laying flat in bed Eyes:  lids and conjunctivae normal ENMT: Mucous membranes are moist. Neck: normal, supple, no JVD  Respiratory: clear to auscultation bilaterally, no wheezing, no crackles. Normal respiratory effort. No accessory muscle use.  Cardiovascular: Regular rate and rhythm, no murmurs / rubs / gallops. No extremity edema.  Abdomen: no tenderness, no masses palpated. No Bowel sounds positive.  Musculoskeletal: no clubbing / cyanosis. No joint deformity upper and lower extremities. Good ROM, no contractures. Normal muscle tone.  Skin: no rashes, lesions, ulcers.  Neurologic: CN 2-12 grossly intact.  normal. Strength 5/5 in all 4.  Psychiatric: Normal judgment and insight. Alert and oriented x 3. Normal mood. Data Reviewed:  See HPI  Assessment and Plan: * Acute on chronic diastolic CHF (congestive heart failure) ([place]) - Patient does not appear overly hypervolemic on exam but has BNP >1000  and CXR finding of prominent pulmonary vascular congestion suggestive of acute on chronic diastolic CHF -last echo on 10/23/2021 with EF of 55-60% and grade 2 diastolic dysfunction. Obtain repeat Echocardiogram -continue daily IV 77m Lasix, follow intake and output, daily weights -He has been following with cardiology output Dr. HPercival Spanish  that there has been concerns of his dyspnea being an anginal equivalent with hx of CABG. Cardiac cath has been discussed in the past but his poor renal function has been limiting factor. Troponin is mildly elevated at this time but likely demand ischemic. Consider cardiology consult.   Acute-on-chronic kidney injury ([place]) -AKI on CKD stage 3b -Creatinine elevated at 2.46.  Unclear baseline but prior creatinine around 1.9. -Continue to monitor while on IV Lasix -Holding ACE inhibitor.  Avoid other nephrotoxic agent. -He is followed by nephrology outpatient  Bradycardia - This is chronic and stable  Essential hypertension Continue amlodipine, clonidine, metoprolol, hydralazine and Imdur. -Hold home losartan due to AKI  Dyslipidemia, goal LDL below 70 Continue statin.  Type 2 diabetes mellitus with chronic kidney disease, with long-term current use of insulin (H[facility]) -Follows with endocrinology outpt -Last hemoglobin A1c of 5.8.  Has been suggested by endocrinology that this is likely incorrect reflection of his diabetes. -can continue with basal pump and follow BG ACHS with moderate SSI      Advance Care Planning:   Code Status: Full Code   Consults: None  Family Communication: Discussed with wife at bedside  Severity of Illness: The appropriate patient status for this patient is INPATIENT. Inpatient status is judged to be reasonable and necessary in order to provide the required intensity of service to ensure the patient's safety. The patient's presenting symptoms, physical exam findings, and initial radiographic and laboratory data in the context  of their chronic comorbidities is felt to place them at high risk for further clinical deterioration. Furthermore, it is not anticipated that the patient will be medically stable for discharge from the hospital within 2 midnights of admission.   * I certify that at the point of admission it is my clinical judgment that the patient will require inpatient hospital care spanning beyond 2 midnights from the point of admission due to high intensity of service, high risk for further deterioration and high frequency of surveillance required.*  Author: Orene Desanctis, DO 03/28/2022 12:33 AM  For on call review [email or web address].

## 2022-03-28 NOTE — Progress Notes (Signed)
Pt states he takes Hydralazine 50 mg PO twice per day instead of three times per day. Tamsulosin also added to pt's home med list.  PTA med list updated.

## 2022-03-28 NOTE — Progress Notes (Signed)
Inpatient Diabetes Program Recommendations  AACE/ADA: New Consensus Statement on Inpatient Glycemic Control (2015)  Target Ranges:  Prepandial:   less than 140 mg/dL      Peak postprandial:   less than 180 mg/dL (1-2 hours)      Critically ill patients:  140 - 180 mg/dL   Lab Results  Component Value Date   GLUCAP 134 (H) 03/28/2022   HGBA1C 5.6 01/25/2022    Review of Glycemic Control  Latest Reference Range & Units 03/27/22 20:45 03/28/22 03:33 03/28/22 07:38 03/28/22 10:48  Glucose-Capillary 70 - 99 mg/dL 104 (H) 70 100 (H) 134 (H)   Diabetes history: DM 2 Outpatient Diabetes medications:  T Slim insulin pump:  MN- 2.15 3A-2.2 8A- 2.4  4P-2.7 7P-3.0 10P-2.4 Total basal: 60 units/ 24 hours Boluses with meals 16 units with breakfast, 8 units with lunch, and 14 units with dinner Current orders for Inpatient glycemic control: Insulin pump  Inpatient Diabetes Program Recommendations:    Spoke to patient.  He is wearing insulin pump- T-Slim at above settings.  He also has Dexcom sensor.  States he is due to change set on Saturday.  Instructed him to have supplies at bedside in case insulin pump falls off or has to be removed for test.  Also instructed him to let RN know if blood sugar is dropping.  CBG's will still be checked as ordered.  Will follow.   Thanks,  Adah Perl, RN, BC-ADM Inpatient Diabetes Coordinator Pager [phone]  (8a-5p)

## 2022-03-28 NOTE — Assessment & Plan Note (Addendum)
-  Patient follows with endocrinology service as an outpatient -Most recent A1c 5.8 -Continue insulin pump and modified carbohydrate diet.

## 2022-03-28 NOTE — Assessment & Plan Note (Addendum)
-  Continue the use of clonidine, metoprolol, Imdur and adjusted dose of hydralazine. -Continue holding ARB due to acute kidney injury and also amlodipine as these can increase swelling in his legs.   -Continue oral lasix '20mg'$  daily starting on 03/30/22

## 2022-03-29 ENCOUNTER — Other Ambulatory Visit: Payer: Self-pay | Admitting: Cardiology

## 2022-03-29 DIAGNOSIS — I5033 Acute on chronic diastolic (congestive) heart failure: Secondary | ICD-10-CM

## 2022-03-29 DIAGNOSIS — E785 Hyperlipidemia, unspecified: Secondary | ICD-10-CM | POA: Diagnosis not present

## 2022-03-29 DIAGNOSIS — R0602 Shortness of breath: Secondary | ICD-10-CM

## 2022-03-29 DIAGNOSIS — N179 Acute kidney failure, unspecified: Secondary | ICD-10-CM | POA: Diagnosis not present

## 2022-03-29 DIAGNOSIS — R001 Bradycardia, unspecified: Secondary | ICD-10-CM | POA: Diagnosis not present

## 2022-03-29 DIAGNOSIS — I10 Essential (primary) hypertension: Secondary | ICD-10-CM | POA: Diagnosis not present

## 2022-03-29 LAB — GLUCOSE, CAPILLARY
Glucose-Capillary: 104 mg/dL — ABNORMAL HIGH (ref 70–99)
Glucose-Capillary: 101 mg/dL — ABNORMAL HIGH (ref 70–99)
Glucose-Capillary: 143 mg/dL — ABNORMAL HIGH (ref 70–99)

## 2022-03-29 LAB — CBC
HCT: 29.3 % — ABNORMAL LOW (ref 39.0–52.0)
Hemoglobin: 10 g/dL — ABNORMAL LOW (ref 13.0–17.0)
MCH: 32.5 pg (ref 26.0–34.0)
MCHC: 34.1 g/dL (ref 30.0–36.0)
MCV: 95.1 fL (ref 80.0–100.0)
Platelets: 123 10*3/uL — ABNORMAL LOW (ref 150–400)
RBC: 3.08 MIL/uL — ABNORMAL LOW (ref 4.22–5.81)
RDW: 14.5 % (ref 11.5–15.5)
WBC: 3.2 10*3/uL — ABNORMAL LOW (ref 4.0–10.5)
nRBC: 0 % (ref 0.0–0.2)

## 2022-03-29 LAB — BASIC METABOLIC PANEL
Anion gap: 10 (ref 5–15)
BUN: 42 mg/dL — ABNORMAL HIGH (ref 8–23)
CO2: 24 mmol/L (ref 22–32)
Calcium: 9.4 mg/dL (ref 8.9–10.3)
Chloride: 104 mmol/L (ref 98–111)
Creatinine, Ser: 3.33 mg/dL — ABNORMAL HIGH (ref 0.61–1.24)
GFR, Estimated: 19 mL/min — ABNORMAL LOW (ref >60)
Glucose, Bld: 108 mg/dL — ABNORMAL HIGH (ref 70–99)
Potassium: 3.8 mmol/L (ref 3.5–5.1)
Sodium: 138 mmol/L (ref 135–145)

## 2022-03-29 LAB — LIPID PANEL
Cholesterol: 102 mg/dL (ref 0–200)
HDL: 24 mg/dL — ABNORMAL LOW (ref >40)
LDL Cholesterol: 57 mg/dL (ref 0–99)
Total CHOL/HDL Ratio: 4.3 RATIO
Triglycerides: 107 mg/dL (ref <150)
VLDL: 21 mg/dL (ref 0–40)

## 2022-03-29 LAB — TSH: TSH: 2.735 u[IU]/mL (ref 0.350–4.500)

## 2022-03-29 MED ORDER — HYDRALAZINE HCL 100 MG PO TABS: 100.0000 mg | ORAL_TABLET | Freq: Three times a day (TID) | ORAL | 2 refills | Status: DC

## 2022-03-29 MED ORDER — FUROSEMIDE 20 MG PO TABS: 20.0000 mg | ORAL_TABLET | Freq: Every day | ORAL | 2 refills | Status: DC

## 2022-03-29 NOTE — Progress Notes (Signed)
Heart Failure Navigator Progress Note  Assessed for Heart & Vascular TOC clinic readiness.  Patient does not meet criteria due to HFpEF, CKD !!!, creat >3.      Earnestine Leys, BSN, Clinical cytogeneticist Only

## 2022-03-29 NOTE — Plan of Care (Signed)
Nutrition Education Note  RD consulted for nutrition education regarding new onset CHF. Spoke with pt and wife. Wife cooks. Pt works 3 days a week and eats out fast food for [employer] and Lunch 3 days a week.   RD provided "Low Sodium Nutrition Therapy" handout from the Academy of [employer] and Dietetics. Reviewed patient's dietary recall. Provided examples on ways to decrease sodium intake in diet. Discouraged intake of processed foods and use of salt shaker. Encouraged fresh fruits and vegetables as well as whole grain sources of carbohydrates to maximize fiber intake.   RD discussed why it is important for patient to adhere to diet recommendations, and emphasized the role of fluids, foods to avoid, and importance of weighing self daily. Teach back method used.  Expect good compliance.  Body mass index is 30.28 kg/m. Pt meets criteria for obesity based on current BMI.  Current diet order is Heart Healthy, patient is consuming approximately 100% of meals at this time. Labs and medications reviewed. No further nutrition interventions warranted at this time. RD contact information provided. If additional nutrition issues arise, please re-consult RD.   Lockie Pares., RD, LDN, CNSC See AMiON for contact information

## 2022-03-29 NOTE — Discharge Summary (Addendum)
Physician Discharge Summary   Patient: Alec [name] MRN: [id number] DOB: April 08, 1946  Admit date:     03/27/2022  Discharge date: 03/29/22  Discharge Physician: Barton Dubois   PCP: Mayra Neer, MD   Recommendations at discharge:  Repeat basic metabolic panel to follow electrolytes and renal function Follow-up patient blood pressure/volume status with further adjustment to medications as needed.  Discharge Diagnoses: Principal Problem:   Acute on chronic diastolic CHF (congestive heart failure) (HCC) Active Problems:   Type 2 diabetes mellitus with chronic kidney disease, with long-term current use of insulin (HCC)   Dyslipidemia, goal LDL below 70   Essential hypertension   S/P CABG x 3   Shortness of breath   Bradycardia   Acute-on-chronic kidney injury ([place]) Leukopenia/thrombocytopenia Anemia of chronic kidney disease  Hospital Course: As per H&P written by Dr. Flossie Buffy on 03/27/2022 Alec [name] is a 76 y.o. male with medical history significant of CAD s/p CABG x3, chronic bradycardia, chronic diastolic heart failure, CKD stage IIIb, insulin-dependent type 2 diabetes, hypertension, rheumatoid arthritis, prostate cancer in remission who presents from outside ED with concerns of CHF exacerbation.   He reports that for the past 2 weeks he has been noting increasing shortness of breath at rest and with exertion.  Has paroxysmal nocturnal dyspnea.  He has chronic issues with shortness of breath followed by cardiology but reports this has been worse.  Denies any lower extremity edema.  No chest pain.  Has noted sometimes productive cough with wheezing.  Has past remote history of tobacco use.  No fever.  Has been diligent with taking his medication.   In the ED, he was afebrile and bradycardic with mild hypertension with BP of 160/60 on room air.  BNP elevated at 1019.  Troponin of 19 and 22.  Chest x-ray showing prominent central pulmonary vessel consistent with CHF. EKG on my review  showed nonspecific finding of inverted T waves at V5 and V6 but otherwise no significant ST changes.  Has mild leukopenia of 3.7, hemoglobin of 9.6 from a prior of 11.5.  Chronic thrombocytopenia of stable at 120.  Acute on chronic CKD with creatinine of 2.46 with prior around 1.9.   He was given IV 40 mg Lasix and transferred here to [name] for further management.  Assessment and Plan: * Acute on chronic diastolic CHF (congestive heart failure) (HCC) -Reporting significant improvement in his breathing; denies chest pain, palpitations, orthopnea and wants to go home. -No requiring oxygen supplementation. -Good urine output reported. -Overdiuresis throughout hospitalization; creatinine up to 3.3.  Following cardiology recommendations we will provide Lasix holiday on 03/30/2022. -Continue daily weights, adequate hydration and low-sodium diet -Resume home antihypertensive medications except for Benicar due to worsening renal function.  Lasix 20 mg by mouth daily recommended at discharge.  Acute-on-chronic kidney injury ([place]) -AKI on CKD stage 3b at baseline,  in the setting of diabetes. -AKI Most likely in the setting of continued use of nephrotoxic agents, diuresis and decreased perfusion with acute CHF exacerbation. -Holding Lasix until 03/30/2022 due to worsening renal function -Continue minimizing nephrotoxic agent -Advised to maintain adequate hydration -Follow-up daily weights and repeat blood work on 04/01/2022.  Anemia of chronic disease -in the setting of chronic renal failure -no overt bleeding -iv iron and epogen as per renal service.  Bradycardia - Stable overall; appears to be chronic. -Continue outpatient follow-up with cardiology service.  Shortness of breath - The setting of acute on chronic diastolic heart failure -Low-sodium diet discussed  with patient -Continue adequate hydration -Improved/resolved after aggressive diuresis during this hospitalization. -Continue  patient follow-up with cardiology service.  S/P CABG x 3 -Patient currently denying chest pain -Cardiology has been consulted -No acute ischemic changes appreciated on telemetry or EKG. -Continue the use of aspirin, metoprolol and statins -2D echo reassuring demonstrating normal wall motion abnormalities and preserved ejection fraction.  Grade 2 diastolic dysfunction with pseudonormalization appreciated.  Essential hypertension -Continue the use of clonidine, metoprolol, Imdur and adjusted dose of hydralazine. -Continue holding ARB due to acute kidney injury and also amlodipine as these can increase swelling in his legs.   -Continue oral lasix $RemoveBe'20mg'eGArSBVse$  daily starting on 03/30/22   Dyslipidemia, goal LDL below 70 -Heart healthy diet discussed with patient. -Continue statin.  Type 2 diabetes mellitus with chronic kidney disease, with long-term current use of insulin ([place]) -Patient follows with endocrinology service as an outpatient -Most recent A1c 5.8 -Continue insulin pump and modified carbohydrate diet.  Leukopenia/thrombocytopenia -mild and borderline -could be associated with chronic disease -will recommend outpatient follow up.   Consultants: Cardiology service Procedures performed: See below for x-ray report; 2D echo. Disposition: Home  Diet recommendation: Low-sodium/heart healthy and modified carbohydrate diet.  DISCHARGE MEDICATION: Allergies as of 03/29/2022       Reactions   Nsaids Other (See Comments)   Told to avoid by dr         Medication List     STOP taking these medications    amLODipine 10 MG tablet Commonly known as: NORVASC   olmesartan 5 MG tablet Commonly known as: BENICAR       TAKE these medications    allopurinol 100 MG tablet Commonly known as: ZYLOPRIM Take 200 mg by mouth daily.   aspirin EC 81 MG tablet Take 81 mg by mouth daily.   atorvastatin 40 MG tablet Commonly known as: LIPITOR Take 1 tablet (40 mg total) by mouth  daily.   CENTRUM SILVER PO Take 1 tablet by mouth daily.   CINNAMON PO Take 1 capsule by mouth daily.   cloNIDine 0.1 MG tablet Commonly known as: CATAPRES Take 1 tablet (0.1 mg total) by mouth 2 (two) times daily.   FreeStyle Freedom Lite w/Device Kit Use to check blood sugar three times daily. DX:E11.65   furosemide 20 MG tablet Commonly known as: Lasix Take 1 tablet (20 mg total) by mouth daily. Start taking on: March 30, 2022   hydrALAZINE 100 MG tablet Commonly known as: APRESOLINE Take 1 tablet (100 mg total) by mouth 3 (three) times daily. What changed:  medication strength how much to take   insulin aspart 100 UNIT/ML injection Commonly known as: novoLOG Use max of 85 units daily via insulin pump What changed:  how much to take how to take this when to take this additional instructions   isosorbide mononitrate 120 MG 24 hr tablet Commonly known as: IMDUR Take 1 tablet (120 mg total) by mouth daily. What changed: how much to take   latanoprost 0.005 % ophthalmic solution Commonly known as: XALATAN Place 1 drop into both eyes at bedtime.   metoprolol tartrate 50 MG tablet Commonly known as: LOPRESSOR TAKE 1 TABLET BY MOUTH TWICE A DAY   nitroGLYCERIN 0.4 MG SL tablet Commonly known as: NITROSTAT Place 1 tablet (0.4 mg total) under the tongue every 5 (five) minutes as needed for chest pain.   OSTEO BI-FLEX ONE PER DAY PO Take 1 tablet by mouth daily.   sertraline 100 MG tablet Commonly known as:  ZOLOFT Take 100 mg by mouth daily.   tamsulosin 0.4 MG Caps capsule Commonly known as: FLOMAX Take 0.4 mg by mouth at bedtime.   TART CHERRY ADVANCED PO Take 1 tablet by mouth daily.   VITAMIN D3 PO Take 1 capsule by mouth daily.        Follow-up Information     [facility] Follow up.   Specialty: Cardiology Why: Please accompany your wife to the [employer] office to have your labs drawn. No need to fast prior to lab  draw Contact information: [address], [place]        Minus Breeding, MD Follow up on 04/04/2022.   Specialty: Cardiology Why: Appointment at 11:30 Contact information: [place] [address] [phone]         Mayra Neer, MD Follow up.   Specialty: Family Medicine Why: The office will call patient. Contact information: [address] [place] [address] [phone]         Minus Breeding, MD .   Specialty: Cardiology Contact information: [address] [place] [address] [phone]                Discharge Exam: Danley Danker Weights   03/27/22 2035 03/28/22 0335 03/29/22 0305  Weight: 87.3 kg 86.5 kg 85.1 kg   General exam: Alert, awake, oriented x 3 Respiratory system: No wheezing, no frank crackles, good oxygen saturation on room air. Cardiovascular system:RRR.  No rubs or gallops.  JVD. Gastrointestinal system: Abdomen is nondistended, soft and nontender. No organomegaly or masses felt. Normal bowel sounds heard. Central nervous system: Alert and oriented. No focal neurological deficits. Extremities: No cyanosis or clubbing; trace edema appreciated bilaterally. Skin: No petechiae. Psychiatry: Judgement and insight appear normal. Mood & affect appropriate.    Condition at discharge: Stable and improved.  The results of significant diagnostics from this hospitalization (including imaging, microbiology, ancillary and laboratory) are listed below for reference.   Imaging Studies: ECHOCARDIOGRAM COMPLETE  Result Date: 03/28/2022    ECHOCARDIOGRAM REPORT   Patient Name:   Alec [name] [facility] Date of Exam: 03/28/2022 Medical Rec #:  [id number]      Height:       66.0 in Accession #:    [id number]     Weight:       190.8 lb Date of Birth:  08-Feb-1946     BSA:          1.960 m Patient Age:    76 years       BP:           155/67 mmHg Patient  Gender: M              HR:           55 bpm. Exam Location:  Inpatient Procedure: 2D Echo, Cardiac Doppler and Color Doppler Indications:     CHF  History:         Patient has prior history of Echocardiogram examinations. Prior                  CABG; Risk Factors:Hypertension, Diabetes and Dyslipidemia.  Sonographer:     Jyl Heinz Referring Phys:  [id number] [place] T TU Diagnosing Phys: Fransico Him MD IMPRESSIONS  1. Left ventricular ejection fraction, by estimation, is 55 to 60%. The left ventricle has normal function. The left ventricle has no regional wall motion abnormalities. Left  ventricular diastolic parameters are consistent with Grade II diastolic dysfunction (pseudonormalization).  2. Right ventricular systolic function is normal. The right ventricular size is normal. Tricuspid regurgitation signal is inadequate for assessing PA pressure.  3. Left atrial size was mildly dilated.  4. The mitral valve is normal in structure. Mild mitral valve regurgitation. No evidence of mitral stenosis.  5. The aortic valve is tricuspid. Aortic valve regurgitation is trivial. Aortic valve sclerosis/calcification is present, without any evidence of aortic stenosis. Aortic valve Vmax measures 1.39 m/s.  6. Pulmonic valve regurgitation is moderate.  7. The inferior vena cava is dilated in size with >50% respiratory variability, suggesting right atrial pressure of 8 mmHg. FINDINGS  Left Ventricle: Left ventricular ejection fraction, by estimation, is 55 to 60%. The left ventricle has normal function. The left ventricle has no regional wall motion abnormalities. The left ventricular internal cavity size was normal in size. There is  no left ventricular hypertrophy. Left ventricular diastolic parameters are consistent with Grade II diastolic dysfunction (pseudonormalization). Normal left ventricular filling pressure. Right Ventricle: The right ventricular size is normal. No increase in right ventricular wall thickness. Right  ventricular systolic function is normal. Tricuspid regurgitation signal is inadequate for assessing PA pressure. Left Atrium: Left atrial size was mildly dilated. Right Atrium: Right atrial size was normal in size. Pericardium: There is no evidence of pericardial effusion. Mitral Valve: The mitral valve is normal in structure. Mild mitral valve regurgitation. No evidence of mitral valve stenosis. Tricuspid Valve: The tricuspid valve is normal in structure. Tricuspid valve regurgitation is trivial. No evidence of tricuspid stenosis. Aortic Valve: The aortic valve is tricuspid. Aortic valve regurgitation is trivial. Aortic valve sclerosis/calcification is present, without any evidence of aortic stenosis. Aortic valve peak gradient measures 7.7 mmHg. Pulmonic Valve: The pulmonic valve was normal in structure. Pulmonic valve regurgitation is moderate. No evidence of pulmonic stenosis. Aorta: The aortic root is normal in size and structure. Venous: The inferior vena cava is dilated in size with greater than 50% respiratory variability, suggesting right atrial pressure of 8 mmHg. IAS/Shunts: No atrial level shunt detected by color flow Doppler.  LEFT VENTRICLE PLAX 2D LVIDd:         5.60 cm      Diastology LVIDs:         4.10 cm      LV e' medial:    5.77 cm/s LV PW:         1.10 cm      LV E/e' medial:  13.1 LV IVS:        1.10 cm      LV e' lateral:   6.96 cm/s LVOT diam:     2.20 cm      LV E/e' lateral: 10.8 LV SV:         106 LV SV Index:   54 LVOT Area:     3.80 cm  LV Volumes (MOD) LV vol d, MOD A2C: 135.0 ml LV vol d, MOD A4C: 143.0 ml LV vol s, MOD A2C: 61.2 ml LV vol s, MOD A4C: 60.1 ml LV SV MOD A2C:     73.8 ml LV SV MOD A4C:     143.0 ml LV SV MOD BP:      78.6 ml RIGHT VENTRICLE             IVC RV Basal diam:  3.60 cm     IVC diam: 2.10 cm RV Mid diam:    3.20 cm RV S  prime:     12.80 cm/s TAPSE (M-mode): 2.6 cm LEFT ATRIUM             Index        RIGHT ATRIUM           Index LA diam:        4.40 cm 2.24  cm/m   RA Area:     18.10 cm LA Vol (A2C):   64.4 ml 32.85 ml/m  RA Volume:   49.70 ml  25.35 ml/m LA Vol (A4C):   76.4 ml 38.98 ml/m LA Biplane Vol: 72.1 ml 36.78 ml/m  AORTIC VALVE AV Area (Vmax): 3.45 cm AV Vmax:        139.00 cm/s AV Peak Grad:   7.7 mmHg LVOT Vmax:      126.00 cm/s LVOT Vmean:     88.800 cm/s LVOT VTI:       0.278 m  AORTA Ao Root diam: 3.10 cm Ao Asc diam:  3.10 cm MITRAL VALVE MV Area (PHT): 4.36 cm    SHUNTS MV Decel Time: 174 msec    Systemic VTI:  0.28 m MR Peak grad: 26.8 mmHg    Systemic Diam: 2.20 cm MR Vmax:      259.00 cm/s MV E velocity: 75.50 cm/s MV A velocity: 68.10 cm/s MV E/A ratio:  1.11 Fransico Him MD Electronically signed by Fransico Him MD Signature Date/Time: 03/28/2022/2:47:05 PM    Final (Updated)    DG Chest 2 View  Result Date: 03/27/2022 CLINICAL DATA:  Shortness of breath EXAM: CHEST - 2 VIEW COMPARISON:  05/25/2020 FINDINGS: Transverse diameter of heart is slightly increased. There is evidence of previous coronary bypass surgery. Central pulmonary vessels are prominent. There are no signs of alveolar pulmonary edema or new focal infiltrates. There is minimal blunting of both posterior CP angles. There is no pneumothorax. IMPRESSION: Central pulmonary vessels are prominent suggesting mild CHF. Small bilateral pleural effusions. There are no signs of alveolar pulmonary edema or new focal infiltrates. Electronically Signed   By: Elmer Picker M.D.   On: 03/27/2022 12:31    Microbiology: Results for orders placed or performed during the hospital encounter of 06/19/20  SARS CORONAVIRUS 2 (TAT 6-24 HRS) Nasopharyngeal Nasopharyngeal Swab     Status: None   Collection Time: 06/19/20 10:49 AM   Specimen: Nasopharyngeal Swab  Result Value Ref Range Status   SARS Coronavirus 2 NEGATIVE NEGATIVE Final    Comment: (NOTE) SARS-CoV-2 target nucleic acids are NOT DETECTED.  The SARS-CoV-2 RNA is generally detectable in upper and lower respiratory  specimens during the acute phase of infection. Negative results do not preclude SARS-CoV-2 infection, do not rule out co-infections with other pathogens, and should not be used as the sole basis for treatment or other patient management decisions. Negative results must be combined with clinical observations, patient history, and epidemiological information. The expected result is Negative.  Fact Sheet for Patients: SugarRoll.be  Fact Sheet for Healthcare Providers: https://www.woods-mathews.com/  This test is not yet approved or cleared by the [place] FDA and  has been authorized for detection and/or diagnosis of SARS-CoV-2 by FDA under an Emergency Use Authorization (EUA). This EUA will remain  in effect (meaning this test can be used) for the duration of the COVID-19 declaration under Se ction 564(b)(1) of the Act, 21 U.S.C. section 360bbb-3(b)(1), unless the authorization is terminated or revoked sooner.  Performed at [facility], [place] [address] [place], [place] [address]  Labs: CBC: Recent Labs  Lab 03/27/22 1211 03/28/22 0632 03/29/22 0217  WBC 3.7* 3.4* 3.2*  NEUTROABS 2.6  --   --   HGB 9.6* 9.8* 10.0*  HCT 28.4* 28.2* 29.3*  MCV 95.0 94.0 95.1  PLT 120* 115* 684*   Basic Metabolic Panel: Recent Labs  Lab 03/27/22 1211 03/28/22 0632 03/29/22 0217  NA 136 140 138  K 3.8 4.0 3.8  CL 110 109 104  CO2 20* 23 24  GLUCOSE 209* 94 108*  BUN 27* 27* 42*  CREATININE 2.46* 2.52* 3.33*  CALCIUM 8.9 9.2 9.4    CBG: Recent Labs  Lab 03/28/22 1728 03/28/22 2100 03/29/22 0303 03/29/22 0603 03/29/22 1209  GLUCAP 180* 71 104* 101* 143*    Discharge time spent: greater than 30 minutes.  Signed: Barton Dubois, MD [facility] 03/29/2022

## 2022-03-29 NOTE — Assessment & Plan Note (Signed)
-   The setting of acute on chronic diastolic heart failure -Low-sodium diet discussed with patient -Continue adequate hydration -Improved/resolved after aggressive diuresis during this hospitalization. -Continue patient follow-up with cardiology service.

## 2022-03-29 NOTE — TOC Initial Note (Signed)
Transition of Care [facility]) - Initial/Assessment Note    Patient Details  Name: Alec [name] MRN: [id number] Date of Birth: July 14, 1946  [facility] CM/SW Contact:    Zenon Mayo, RN Phone Number: 03/29/2022, 2:25 PM  Clinical Narrative:                 Patient is for dc today, he has no needs. Wife is at bedside to transport him home.  Nutrition gave him diet information.  Expected Discharge Plan: Home/Self Care Barriers to Discharge: No Barriers Identified   Patient Goals and CMS Choice Patient states their goals for this hospitalization and ongoing recovery are:: return home with wife   Choice offered to / list presented to : NA  Expected Discharge Plan and Services Expected Discharge Plan: Home/Self Care   Discharge Planning Services: CM Consult Post Acute Care Choice: NA   Expected Discharge Date: 03/29/22                 DME Agency: NA       HH Arranged: NA          Prior Living Arrangements/Services   Lives with:: Spouse Patient language and need for interpreter reviewed:: Yes Do you feel safe going back to the place where you live?: Yes      Need for Family Participation in Patient Care: Yes (Comment) Care giver support system in place?: Yes (comment)   Criminal Activity/Legal Involvement Pertinent to Current Situation/Hospitalization: No - Comment as needed  Activities of Daily Living      Permission Sought/Granted                  Emotional Assessment Appearance:: Appears stated age Attitude/Demeanor/Rapport: Engaged Affect (typically observed): Appropriate Orientation: : Oriented to Self, Oriented to Place, Oriented to  Time, Oriented to Situation Alcohol / Substance Use: Not Applicable Psych Involvement: No (comment)  Admission diagnosis:  Shortness of breath [R06.02] CHF (congestive heart failure) ([place]) [I50.9] Hypervolemia, unspecified hypervolemia type [E87.70] Patient Active Problem List   Diagnosis Date  Noted   Acute on chronic diastolic CHF (congestive heart failure) ([place]) 03/28/2022   Acute-on-chronic kidney injury ([place]) 03/28/2022   Snoring 07/18/2020   Excessive sleepiness 07/18/2020   Witnessed episode of apnea 07/18/2020   Leg swelling 04/30/2020   Bradycardia 04/30/2020   Malignant neoplasm of prostate ([place]) 04/18/2020   Educated about COVID-19 virus infection 10/26/2019   Rheumatoid arthritis of multiple sites with negative rheumatoid factor ([place]) 09/06/2019   Shortness of breath 11/10/2018   Cough 11/10/2018   Wheezing 11/10/2018   Coronary artery disease involving native coronary artery of native heart without angina pectoris 11/10/2018   Dyslipidemia 11/10/2018   CRI (chronic renal insufficiency), stage 3 (moderate) ([place]) 08/06/2018   S/P CABG x 3 07/20/2018   Angina, class II ([place]) 07/14/2018   Abnormal stress electrocardiography using treadmill 07/14/2018   Cellulitis of left arm 12/10/2017   Cellulitis 12/09/2017   Diabetic nephropathy associated with type 2 diabetes mellitus ([place]) 12/14/2014   Essential hypertension 12/14/2014   Type 2 diabetes mellitus with chronic kidney disease, with long-term current use of insulin ([place]) 08/29/2012   Dyslipidemia, goal LDL below 70 08/29/2012   Abdominal pain 08/29/2012   PCP:  Mayra Neer, MD Pharmacy:   [facility], [place] [address] [place] [address] Phone: [phone] Fax: [phone]  [id number] [place] [address] [address]:  [phone] Fax: [phone]  [facility] [place], [place] [address] Phone: [phone] Fax: [phone]     Social Determinants of Health (SDOH) Interventions    Readmission Risk Interventions    03/29/2022    2:24 PM  Readmission Risk Prevention Plan   Transportation Screening Complete  PCP or Specialist Appt within 5-7 Days Complete  Home Care Screening Complete  Medication Review (RN CM) Complete

## 2022-03-29 NOTE — Progress Notes (Signed)
Patient given discharge instructions and stated understanding. 

## 2022-03-29 NOTE — TOC Transition Note (Signed)
Transition of Care [facility]) - CM/SW Discharge Note   Patient Details  Name: Alec [name] MRN: [id number] Date of Birth: January 08, 1946  Transition of Care [facility]) CM/SW Contact:  Zenon Mayo, RN Phone Number: 03/29/2022, 2:26 PM   Clinical Narrative:    For dc home has no needs.   Final next level of care: Home/Self Care Barriers to Discharge: No Barriers Identified   Patient Goals and CMS Choice Patient states their goals for this hospitalization and ongoing recovery are:: return home with wife   Choice offered to / list presented to : NA  Discharge Placement                       Discharge Plan and Services   Discharge Planning Services: CM Consult Post Acute Care Choice: NA            DME Agency: NA       HH Arranged: NA          Social Determinants of Health (SDOH) Interventions     Readmission Risk Interventions    03/29/2022    2:24 PM  Readmission Risk Prevention Plan  Transportation Screening Complete  PCP or Specialist Appt within 5-7 Days Complete  Home Care Screening Complete  Medication Review (RN CM) Complete

## 2022-03-29 NOTE — Care Management Important Message (Signed)
Important Message  Patient Details  Name: Alec [name] MRN: [id number] Date of Birth: [birth year]   Medicare Important Message Given:  Yes     [name] 03/29/2022, 8:34 AM

## 2022-03-29 NOTE — Progress Notes (Signed)
Mobility Specialist Progress Note:   03/29/22 1248  Mobility  Activity Ambulated with assistance in hallway  Level of Assistance Standby assist, set-up cues, supervision of patient - no hands on  Assistive Device None  Distance Ambulated (ft) 550 ft  Activity Response Tolerated well  $Mobility charge 1 Mobility   Pt received EOB willing to participate in mobility. No complaints of pain. Left EOB with call bell in reach and all needs met.   [facility] [place] [facility] Mobility Specialist

## 2022-03-29 NOTE — Progress Notes (Signed)
Progress Note  Patient Name: Alec [name] Date of Encounter: 03/29/2022  [facility] Cardiologist: Minus Breeding, MD   Subjective   Feels well today. Wants to go home. No chest pain or SOB.   Inpatient Medications    Scheduled Meds:  allopurinol  200 mg Oral Daily   aspirin EC  81 mg Oral Daily   atorvastatin  40 mg Oral Daily   cloNIDine  0.1 mg Oral BID   heparin  5,000 Units Subcutaneous Q8H   hydrALAZINE  100 mg Oral Q8H   insulin pump   Subcutaneous TID WC, HS, 0200   isosorbide mononitrate  120 mg Oral Daily   latanoprost  1 drop Both Eyes QHS   metoprolol tartrate  50 mg Oral BID   sertraline  100 mg Oral Daily   tamsulosin  0.4 mg Oral QPC supper   Continuous Infusions:  PRN Meds: mouth rinse   Vital Signs    Vitals:   03/28/22 2147 03/29/22 0305 03/29/22 0738 03/29/22 0832  BP:  120/66 124/71 124/71  Pulse: 62 (!) 56 (!) 52 61  Resp:  17 18   Temp:  98.9 F (37.2 C) 97.8 F (36.6 C)   TempSrc:  Oral Oral   SpO2:  92% 93%   Weight:  85.1 kg    Height:        Intake/Output Summary (Last 24 hours) at 03/29/2022 1031 Last data filed at 03/29/2022 0852 Gross per 24 hour  Intake 1057 ml  Output 2750 ml  Net -1693 ml      03/29/2022    3:05 AM 03/28/2022    3:35 AM 03/27/2022    8:35 PM  Last 3 Weights  Weight (lbs) 187 lb 9.6 oz 190 lb 12.8 oz 192 lb 7.4 oz  Weight (kg) 85.095 kg 86.546 kg 87.3 kg      Telemetry    NSR - Personally Reviewed  ECG    No new ECG - Personally Reviewed  Physical Exam   GEN: No acute distress.   Neck: No JVD Cardiac: RRR, no murmurs, rubs, or gallops.  Respiratory: Clear to auscultation bilaterally. GI: Soft, nontender, non-distended  MS: No edema; No deformity. Neuro:  Nonfocal  Psych: Normal affect   Labs    High Sensitivity Troponin:   Recent Labs  Lab 03/27/22 1212 03/27/22 1446  TROPONINIHS 19* 22*     Chemistry Recent Labs  Lab 03/27/22 1211 03/28/22 0632 03/29/22 0217  NA 136  140 138  K 3.8 4.0 3.8  CL 110 109 104  CO2 20* 23 24  GLUCOSE 209* 94 108*  BUN 27* 27* 42*  CREATININE 2.46* 2.52* 3.33*  CALCIUM 8.9 9.2 9.4  GFRNONAA 27* 26* 19*  ANIONGAP '6 8 10    '$ Lipids  Recent Labs  Lab 03/29/22 0217  CHOL 102  TRIG 107  HDL 24*  LDLCALC 57  CHOLHDL 4.3    Hematology Recent Labs  Lab 03/27/22 1211 03/28/22 0632 03/29/22 0217  WBC 3.7* 3.4* 3.2*  RBC 2.99* 3.00* 3.08*  HGB 9.6* 9.8* 10.0*  HCT 28.4* 28.2* 29.3*  MCV 95.0 94.0 95.1  MCH 32.1 32.7 32.5  MCHC 33.8 34.8 34.1  RDW 14.6 14.5 14.5  PLT 120* 115* 123*   Thyroid  Recent Labs  Lab 03/29/22 0217  TSH 2.735    BNP Recent Labs  Lab 03/27/22 1211  BNP 1,019.1*    DDimer No results for input(s): "DDIMER" in the last 168 hours.  Radiology    ECHOCARDIOGRAM COMPLETE  Result Date: 03/28/2022    ECHOCARDIOGRAM REPORT   Patient Name:   Alec [name] [facility] Date of Exam: 03/28/2022 Medical Rec #:  [id number]      Height:       66.0 in Accession #:    [id number]     Weight:       190.8 lb Date of Birth:  July 28, 1946     BSA:          1.960 m Patient Age:    45 years       BP:           155/67 mmHg Patient Gender: M              HR:           55 bpm. Exam Location:  Inpatient Procedure: 2D Echo, Cardiac Doppler and Color Doppler Indications:     CHF  History:         Patient has prior history of Echocardiogram examinations. Prior                  CABG; Risk Factors:Hypertension, Diabetes and Dyslipidemia.  Sonographer:     Jyl Heinz Referring Phys:  [id number] [place] T TU Diagnosing Phys: Fransico Him MD IMPRESSIONS  1. Left ventricular ejection fraction, by estimation, is 55 to 60%. The left ventricle has normal function. The left ventricle has no regional wall motion abnormalities. Left ventricular diastolic parameters are consistent with Grade II diastolic dysfunction (pseudonormalization).  2. Right ventricular systolic function is normal. The right ventricular size is normal. Tricuspid  regurgitation signal is inadequate for assessing PA pressure.  3. Left atrial size was mildly dilated.  4. The mitral valve is normal in structure. Mild mitral valve regurgitation. No evidence of mitral stenosis.  5. The aortic valve is tricuspid. Aortic valve regurgitation is trivial. Aortic valve sclerosis/calcification is present, without any evidence of aortic stenosis. Aortic valve Vmax measures 1.39 m/s.  6. Pulmonic valve regurgitation is moderate.  7. The inferior vena cava is dilated in size with >50% respiratory variability, suggesting right atrial pressure of 8 mmHg. FINDINGS  Left Ventricle: Left ventricular ejection fraction, by estimation, is 55 to 60%. The left ventricle has normal function. The left ventricle has no regional wall motion abnormalities. The left ventricular internal cavity size was normal in size. There is  no left ventricular hypertrophy. Left ventricular diastolic parameters are consistent with Grade II diastolic dysfunction (pseudonormalization). Normal left ventricular filling pressure. Right Ventricle: The right ventricular size is normal. No increase in right ventricular wall thickness. Right ventricular systolic function is normal. Tricuspid regurgitation signal is inadequate for assessing PA pressure. Left Atrium: Left atrial size was mildly dilated. Right Atrium: Right atrial size was normal in size. Pericardium: There is no evidence of pericardial effusion. Mitral Valve: The mitral valve is normal in structure. Mild mitral valve regurgitation. No evidence of mitral valve stenosis. Tricuspid Valve: The tricuspid valve is normal in structure. Tricuspid valve regurgitation is trivial. No evidence of tricuspid stenosis. Aortic Valve: The aortic valve is tricuspid. Aortic valve regurgitation is trivial. Aortic valve sclerosis/calcification is present, without any evidence of aortic stenosis. Aortic valve peak gradient measures 7.7 mmHg. Pulmonic Valve: The pulmonic valve was  normal in structure. Pulmonic valve regurgitation is moderate. No evidence of pulmonic stenosis. Aorta: The aortic root is normal in size and structure. Venous: The inferior vena cava is dilated in size with greater than 50% respiratory variability, suggesting  right atrial pressure of 8 mmHg. IAS/Shunts: No atrial level shunt detected by color flow Doppler.  LEFT VENTRICLE PLAX 2D LVIDd:         5.60 cm      Diastology LVIDs:         4.10 cm      LV e' medial:    5.77 cm/s LV PW:         1.10 cm      LV E/e' medial:  13.1 LV IVS:        1.10 cm      LV e' lateral:   6.96 cm/s LVOT diam:     2.20 cm      LV E/e' lateral: 10.8 LV SV:         106 LV SV Index:   54 LVOT Area:     3.80 cm  LV Volumes (MOD) LV vol d, MOD A2C: 135.0 ml LV vol d, MOD A4C: 143.0 ml LV vol s, MOD A2C: 61.2 ml LV vol s, MOD A4C: 60.1 ml LV SV MOD A2C:     73.8 ml LV SV MOD A4C:     143.0 ml LV SV MOD BP:      78.6 ml RIGHT VENTRICLE             IVC RV Basal diam:  3.60 cm     IVC diam: 2.10 cm RV Mid diam:    3.20 cm RV S prime:     12.80 cm/s TAPSE (M-mode): 2.6 cm LEFT ATRIUM             Index        RIGHT ATRIUM           Index LA diam:        4.40 cm 2.24 cm/m   RA Area:     18.10 cm LA Vol (A2C):   64.4 ml 32.85 ml/m  RA Volume:   49.70 ml  25.35 ml/m LA Vol (A4C):   76.4 ml 38.98 ml/m LA Biplane Vol: 72.1 ml 36.78 ml/m  AORTIC VALVE AV Area (Vmax): 3.45 cm AV Vmax:        139.00 cm/s AV Peak Grad:   7.7 mmHg LVOT Vmax:      126.00 cm/s LVOT Vmean:     88.800 cm/s LVOT VTI:       0.278 m  AORTA Ao Root diam: 3.10 cm Ao Asc diam:  3.10 cm MITRAL VALVE MV Area (PHT): 4.36 cm    SHUNTS MV Decel Time: 174 msec    Systemic VTI:  0.28 m MR Peak grad: 26.8 mmHg    Systemic Diam: 2.20 cm MR Vmax:      259.00 cm/s MV E velocity: 75.50 cm/s MV A velocity: 68.10 cm/s MV E/A ratio:  1.11 Fransico Him MD Electronically signed by Fransico Him MD Signature Date/Time: 03/28/2022/2:47:05 PM    Final (Updated)    DG Chest 2 View  Result Date:  03/27/2022 CLINICAL DATA:  Shortness of breath EXAM: CHEST - 2 VIEW COMPARISON:  05/25/2020 FINDINGS: Transverse diameter of heart is slightly increased. There is evidence of previous coronary bypass surgery. Central pulmonary vessels are prominent. There are no signs of alveolar pulmonary edema or new focal infiltrates. There is minimal blunting of both posterior CP angles. There is no pneumothorax. IMPRESSION: Central pulmonary vessels are prominent suggesting mild CHF. Small bilateral pleural effusions. There are no signs of alveolar pulmonary edema or new focal infiltrates. Electronically Signed  By: Elmer Picker M.D.   On: 03/27/2022 12:31    Cardiac Studies   TTE 03/28/22: IMPRESSIONS   1. Left ventricular ejection fraction, by estimation, is 55 to 60%. The  left ventricle has normal function. The left ventricle has no regional  wall motion abnormalities. Left ventricular diastolic parameters are  consistent with Grade II diastolic  dysfunction (pseudonormalization).   2. Right ventricular systolic function is normal. The right ventricular  size is normal. Tricuspid regurgitation signal is inadequate for assessing  PA pressure.   3. Left atrial size was mildly dilated.   4. The mitral valve is normal in structure. Mild mitral valve  regurgitation. No evidence of mitral stenosis.   5. The aortic valve is tricuspid. Aortic valve regurgitation is trivial.  Aortic valve sclerosis/calcification is present, without any evidence of  aortic stenosis. Aortic valve Vmax measures 1.39 m/s.   6. Pulmonic valve regurgitation is moderate.   7. The inferior vena cava is dilated in size with >50% respiratory  variability, suggesting right atrial pressure of 8 mmHg.   Cardiac Cath 06/2018 LV Findings: The left ventricular systolic function is low-normal. The left ventricular ejection fraction is 50-55% by visual estimate. LV end diastolic pressure is moderately elevated. Angiographic  Findings: Mid RCA-1 lesion is 40% stenosed. Mid RCA-2 lesion is 95% stenosed. Dist RCA lesion is 95% stenosed just at the takeoff of the RPAV. Mid LM to Prox LAD lesion is 50% stenosed. -Extensive long vessel that is calcified. Very small circumflex and ramus. Prox LAD lesion is 90% stenosed at takeoff of 1st Diag. Prox LAD to Mid LAD lesion is 80% stenosed.   Severe multivessel disease with distal left main calcified 40 to 50% stenosis, proximal LAD tandem 90 and 80% lesions covering along area and including major first diagonal branch.  This is in conjunction with 2 significant mid and distal RCA lesions of 95% and then a 90% bifurcation lesion just prior to the takeoff of the PAB. Likely low normal EF but difficult to assess wall motion because of ectopy.  Recommend 2D echo prior to discharge. Moderate elevated LVEDP.   Based on the patient's anatomy, I think he is best served for CABG.  We will consult CT surgery. Given his reduced renal function, we will admit him for overnight observation for IV fluid hydration.   Would like to consider increasing Crestor dose on discharge. Would start antianginal medication if blood pressure room tolerates use amlodipine versus Imdur and provide Seldinger nitroglycerin as needed.   Recommend Aspirin '81mg'$  daily for severe multivessel CAD.  Patient Profile     76 y.o. male with a hx of CAD s/p CABGx3 in 2019 (LIMA-LAD, reverse SVG-intermediate, reverse SVG-PDA), possible HFpEF, mild-moderate mitral regurgitation, mild AI, anxiety, arthritis, CKD stage IV, bladder CA s/p TURBT, prostate CA, HTN, HLD, IDDM, RA, chronic appearing anemia/thrombocytopenia, OSA, remote tobacco use who presented to the ER with worsening dyspnea on exertion and orthopnea found to have BNP 1019 with pulmonary edema on CXR consistent with acute on chronic diastolic HF exacerbation for which Cardiology was consulted.  Assessment & Plan    #Acute on Chronic Diastolic HF: Patient  presented with worsening dyspnea on exertion and orthopnea found to have BNP 1019 with pulmonary edema on CXR consistent with acute on chronic diastolic HF exacerbation. TTE with Lvf 55-60%, G2DD, normal RV, mild MR, trivial AI, moderate PI, RAP 58mHg. Suspect primary driver of acute exacerbation is dietary noncompliance. Diuresed well with mild rise in Cr today.  Will hold diuresis today and start low dose lasix '20mg'$  daily given brisk response to '40mg'$  IV. He declined staying to monitor renal function so will arrange for labs early next week. He has follow-up with Dr. Percival Spanish next week as well. -Hold lasix today -Start lasix '20mg'$  PO daily tomorrow -Repeat BMET on Monday next week -Discussed if he gains weight to increase lasix to '40mg'$  daily. If he is losing weight to hold the lasix and only take as needed for weight gain. -Cannot add SGLT2i due to low GFR -Holding MRA due to Cr>2.5 -Emphasized the importance of low Na diet as suspect this is the primary driver of acute exacerbation  #CAD s/p CABG: #Mild Trop elevation: Patient with history of CAD s/p CABG in 2019 with LIMA-LAD, reverse SVG-intermediate, reverse SVG-PDA. Trop flat 19>22 with no ischemic changes on ECG. Do not suspect ischemia is primary driver of acute heart failure. Will continue with medical management.  -Continue ASA '81mg'$  daily, lipitor '40mg'$  daily  #HTN: Blood pressure controlled.  -Continue home clonidine 0.'1mg'$  BID -Continue hydralazine '100mg'$  TID -Continue imdur '120mg'$  daily  #Mild MR, mild AI: -Serial monitoring as outpatient  #HLD: -Continue lipitor '40mg'$  daily -LDL 57, TG 107, HDL 24  #AKI on CKD IV: Likely due to overdiuresis. -Hold lasix today -Repeat BMET on Monday to monitor renal function   Okay to discharge home today. Hold lasix for today and will start lasix '20mg'$  PO daily tomorrow. Will monitor weights closely as above and adjust dosing as needed. Will arrange for BMET on Monday to monitor renal function.  Has follow-up with Dr. Percival Spanish next week.      For questions or updates, please contact [place] Please consult [email or web address] for contact info under        Signed, Freada Bergeron, MD  03/29/2022, 10:31 AM

## 2022-04-01 ENCOUNTER — Other Ambulatory Visit: Payer: Medicare Other | Admitting: *Deleted

## 2022-04-01 DIAGNOSIS — I5033 Acute on chronic diastolic (congestive) heart failure: Secondary | ICD-10-CM | POA: Diagnosis not present

## 2022-04-01 LAB — BASIC METABOLIC PANEL
BUN/Creatinine Ratio: 17 (ref 10–24)
BUN: 69 mg/dL — ABNORMAL HIGH (ref 8–27)
CO2: 20 mmol/L (ref 20–29)
Calcium: 9.2 mg/dL (ref 8.6–10.2)
Chloride: 104 mmol/L (ref 96–106)
Creatinine, Ser: 3.96 mg/dL — ABNORMAL HIGH (ref 0.76–1.27)
Glucose: 97 mg/dL (ref 70–99)
Potassium: 4.5 mmol/L (ref 3.5–5.2)
Sodium: 138 mmol/L (ref 134–144)
eGFR: 15 mL/min/{1.73_m2} — ABNORMAL LOW (ref >59)

## 2022-04-03 DIAGNOSIS — N184 Chronic kidney disease, stage 4 (severe): Secondary | ICD-10-CM | POA: Insufficient documentation

## 2022-04-03 NOTE — Progress Notes (Unsigned)
Cardiology Office Note   Date:  04/04/2022   ID:  Alec [name], DOB 09-08-1946, MRN [id number]  PCP:  Mayra Neer, MD  Cardiologist:   Minus Breeding, MD   Chief Complaint  Patient presents with   Shortness of Breath      History of Present Illness: Alec [name] is a 76 y.o. male who presents for follow up of CAD status post CABG.     Since I last saw him he was admitted with acute diastolic heart failure..  I reviewed these records for this visit.   He had AKI on CKD in the hospital and was sent home with no Lasix for a few days.  He had follow up creat and his creat was up further.   He was sent home off of Norvasc and ARB.   Echo demonstrated a normal EF and moderate PI but no elevated pulmonary pressures.    When he went home his weight was 188 pounds.  He was breathing okay at that point.  He has been trying even more diligently to watch his salt.  He is confused about how much fluid he is supposed to drink because he is getting conflicting messages.  He is confused about who is managing his blood pressure.  His blood pressures have actually been well controlled.  He has had no swelling.  He is not having any chest pain.  He is not having any palpitations, presyncope or syncope.  He denies any PND or orthopnea.      Past Medical History:  Diagnosis Date   Anemia    Anxiety    Aortic insufficiency    Arthritis    Bilateral lower extremity edema    Chronic gout    06-19-2020  per pt last episode 6 months , great toe   CKD (chronic kidney disease), stage IV [facility])    Coronary artery disease cardiologist--- dr Umi Mainor   CABG 2019   Degenerative arthritis of shoulder region 08/2013   left   History of bladder cancer 07/2008   s/p  TURBT   History of cellulitis 11/2017   left upper arm   History of kidney stones    Hyperlipidemia    Hypertension    followed by pcp/ cardiology   IDDM (insulin dependent diabetes mellitus)    endocrinologist--- dr Dwyane Dee---  pt  uses insulin pump and dexcom  (06-19-2020 per pt fasting sugar-- 98--110)   Insulin pump in place    Mitral regurgitation    OSA (obstructive sleep apnea)    Prostate cancer [facility]) urologist--- dr herrick/  oncologist--- [place]   dx 11/ 2017  Gleason 3+3 active survillance;  bx 03-14-2020 Gleason 3+4   RA (rheumatoid arthritis) ([place])    rhemotologist--- dr Normajean Glasgow---     S/P CABG x 3 07/15/2018   LIMA to LAD;  SVG to PDA;  SVG to RI   Thrombocytopenia [facility])     Past Surgical History:  Procedure Laterality Date   CHOLECYSTECTOMY  08/29/2012   Procedure: LAPAROSCOPIC CHOLECYSTECTOMY WITH INTRAOPERATIVE CHOLANGIOGRAM;  Surgeon: Imogene Burn. Tsuei, MD;  Location: [facility];  Service: General;  Laterality: N/A;   CORONARY ARTERY BYPASS GRAFT N/A 07/20/2018   Procedure: CORONARY ARTERY BYPASS GRAFTING (CABG) times three using left internal mammary artery to the LAD, and using endoscopically harvested right saphenous vein to PDA and intermedius.;  Surgeon: Grace Isaac, MD;  Location: [place];  Service: Open Heart Surgery;  Laterality: N/A;   LEFT  HEART CATH AND CORONARY ANGIOGRAPHY N/A 07/15/2018   Procedure: LEFT HEART CATH AND CORONARY ANGIOGRAPHY;  Surgeon: Leonie Man, MD;  Location: [place] CV LAB;  Service: Cardiovascular;  Laterality: N/A;   RADIOACTIVE SEED IMPLANT N/A 06/21/2020   Procedure: RADIOACTIVE SEED IMPLANT/BRACHYTHERAPY IMPLANT;  Surgeon: Ardis Hughs, MD;  Location: [facility];  Service: Urology;  Laterality: N/A;   SHOULDER ARTHROSCOPY W/ ROTATOR CUFF REPAIR Right 09/03/2013   SHOULDER ARTHROSCOPY WITH SUBACROMIAL DECOMPRESSION, ROTATOR CUFF REPAIR AND BICEP TENDON REPAIR Left 09/03/2013   Procedure: LEFT SHOULDER ARTHROSCOPY WITH EXTENSIVE DEBRIDMENT, DISTAL CLAVICULECTOMY, ROTATOR CUFF REPAIR AND SUBACROMIAL DECOMPRESSION PARTIAL ACRIOMIOPLASTY WITH CORACOACROMIAL RELEASE;  Surgeon: Renette Butters, MD;  Location: [place];   Service: Orthopedics;  Laterality: Left;   SPACE OAR INSTILLATION N/A 06/21/2020   Procedure: SPACE OAR INSTILLATION;  Surgeon: Ardis Hughs, MD;  Location[facility];  Service: Urology;  Laterality: N/A;   TEE WITHOUT CARDIOVERSION N/A 07/20/2018   Procedure: TRANSESOPHAGEAL ECHOCARDIOGRAM (TEE);  Surgeon: Grace Isaac, MD;  Location: [place];  Service: Open Heart Surgery;  Laterality: N/A;   TRANSURETHRAL RESECTION OF BLADDER TUMOR WITH MITOMYCIN-C  08/24/2008   '@[facility]'$      Current Outpatient Medications  Medication Sig Dispense Refill   allopurinol (ZYLOPRIM) 100 MG tablet Take 200 mg by mouth daily.      aspirin EC 81 MG tablet Take 81 mg by mouth daily.     atorvastatin (LIPITOR) 40 MG tablet Take 1 tablet (40 mg total) by mouth daily. 90 tablet 3   Boswellia-Glucosamine-Vit D (OSTEO BI-FLEX ONE PER DAY PO) Take 1 tablet by mouth daily.     Cholecalciferol (VITAMIN D3 PO) Take 1 capsule by mouth daily.     CINNAMON PO Take 500 mg by mouth daily.     cloNIDine (CATAPRES) 0.1 MG tablet Take 1 tablet (0.1 mg total) by mouth 2 (two) times daily. 60 tablet 11   furosemide (LASIX) 20 MG tablet Take 1 tablet (20 mg total) by mouth daily. 30 tablet 2   insulin aspart (NOVOLOG) 100 UNIT/ML injection Use max of 85 units daily via insulin pump (Patient taking differently: Inject 50-85 Units into the skin daily. via insulin pump) 80 mL 2   isosorbide mononitrate (IMDUR) 120 MG 24 hr tablet Take 1 tablet (120 mg total) by mouth daily. 90 tablet 3   latanoprost (XALATAN) 0.005 % ophthalmic solution Place 1 drop into both eyes at bedtime.     metoprolol tartrate (LOPRESSOR) 50 MG tablet TAKE 1 TABLET BY MOUTH TWICE A DAY (Patient taking differently: Take 50 mg by mouth 2 (two) times daily.) 180 tablet 1   Misc Natural Products (TART CHERRY ADVANCED PO) Take 1 tablet by mouth daily.     Misc Natural Products (TART CHERRY ADVANCED) CAPS Take 1,000 mg by mouth daily in the  afternoon.     Multiple Vitamins-Minerals (CENTRUM SILVER PO) Take 1 tablet by mouth daily.     sertraline (ZOLOFT) 100 MG tablet Take 100 mg by mouth daily.     tamsulosin (FLOMAX) 0.4 MG CAPS capsule Take 0.4 mg by mouth at bedtime.     Blood Glucose Monitoring Suppl (FREESTYLE FREEDOM LITE) w/Device KIT Use to check blood sugar three times daily. DX:E11.65 (Patient not taking: Reported on 04/04/2022) 1 kit 0   hydrALAZINE (APRESOLINE) 100 MG tablet Take 1 tablet (100 mg total) by mouth 3 (three) times daily. (Patient not taking: Reported on 04/04/2022) 90  tablet 2   nitroGLYCERIN (NITROSTAT) 0.4 MG SL tablet Place 1 tablet (0.4 mg total) under the tongue every 5 (five) minutes as needed for chest pain. (Patient not taking: Reported on 04/04/2022) 30 tablet 1   No current facility-administered medications for this visit.    Allergies:   Nsaids    ROS:  Please see the history of present illness.   Otherwise, review of systems are positive for none.   All other systems are reviewed and negative.    PHYSICAL EXAM: VS:  BP (!) 118/56 (BP Location: Left Arm, Patient Position: Sitting, Cuff Size: Large)   Pulse (!) 44   Ht 5\' 6"  (1.676 m)   Wt 191 lb 9.6 oz (86.9 kg)   SpO2 96%   BMI 30.93 kg/m  , BMI Body mass index is 30.93 kg/m. GENERAL:  Well appearing NECK:  No jugular venous distention, waveform within normal limits, carotid upstroke brisk and symmetric, no bruits, no thyromegaly LUNGS:  Clear to auscultation bilaterally CHEST:  Well healed sternotomy scar. HEART:  PMI not displaced or sustained,S1 and S2 within normal limits, no S3, no S4, no clicks, no rubs, no murmurs ABD:  Flat, positive bowel sounds normal in frequency in pitch, no bruits, no rebound, no guarding, no midline pulsatile mass, no hepatomegaly, no splenomegaly EXT:  2 plus pulses throughout, no edema, no cyanosis no clubbing  EKG:  EKG is notordered today.     Recent Labs: 05/25/2021: ALT 27 03/27/2022: B  Natriuretic Peptide 1,019.1 03/29/2022: Hemoglobin 10.0; Platelets 123; TSH 2.735 04/01/2022: BUN 69; Creatinine, Ser 3.96; Potassium 4.5; Sodium 138    Lipid Panel    Component Value Date/Time   CHOL 102 03/29/2022 0217   CHOL 116 11/03/2020 0908   TRIG 107 03/29/2022 0217   HDL 24 (L) 03/29/2022 0217   HDL 33 (L) 11/03/2020 0908   CHOLHDL 4.3 03/29/2022 0217   VLDL 21 03/29/2022 0217   LDLCALC 57 03/29/2022 0217   LDLCALC 63 11/03/2020 0908      Wt Readings from Last 3 Encounters:  04/04/22 191 lb 9.6 oz (86.9 kg)  03/29/22 187 lb 9.6 oz (85.1 kg)  02/22/22 200 lb (90.7 kg)      Other studies Reviewed: Additional studies/ records that were reviewed today include: Labs Review of the above records demonstrates:  Please see elsewhere in the note.     ASSESSMENT AND PLAN:   ACUTE ON CHRONIC DIASTOLIC HF:   We again had a long educational discussion about this.  I think he is starting to understand the salt in be more diligent about this.  I have limited him to 48 ounces of fluid and suggested that he should be on the phone with 02/24/22 if his weight gets up above 192 pounds.  For now he will use a diuretic at 20 mg of Lasix with as needed 20 if he gains 2 pounds in a day.  I did send a message out to Dr. [place] as I think he is approaching the need for dialysis from a volume standpoint if we can control this further.   BRADYCARDIA:   He has had no bradycardia arrhythmias symptomatically.  No change in therapy.  CAD:    He does not have any active ischemic symptoms.  No change in therapy.  HTN:  The blood pressure is controlled.  He will continue the meds as listed.  HYPERLIPIDEMIA:   LDL was 57 with an HDL 24.  No change in therapy.  DM:   A1C was 5.6.  No change in therapy.  CKD IV:   Creat is up creatinine is as above.  I am going to see if the nephrologist want to see him for his scheduled appointment in August.    Current medicines are reviewed at length with the patient  today.  The patient does not have concerns regarding medicines.  The following changes have been made: As above  Labs/ tests ordered today include:   BMET  Orders Placed This Encounter  Procedures   Basic metabolic panel     Disposition:   FU with APP in 1 month  Signed, Minus Breeding, MD  04/04/2022 12:46 PM    [place]

## 2022-04-04 ENCOUNTER — Ambulatory Visit (INDEPENDENT_AMBULATORY_CARE_PROVIDER_SITE_OTHER): Payer: Medicare Other | Admitting: Cardiology

## 2022-04-04 ENCOUNTER — Encounter: Payer: Self-pay | Admitting: Cardiology

## 2022-04-04 VITALS — BP 118/56 | HR 44 | Ht 66.0 in | Wt 191.6 lb

## 2022-04-04 DIAGNOSIS — I2510 Atherosclerotic heart disease of native coronary artery without angina pectoris: Secondary | ICD-10-CM | POA: Diagnosis not present

## 2022-04-04 DIAGNOSIS — I5033 Acute on chronic diastolic (congestive) heart failure: Secondary | ICD-10-CM

## 2022-04-04 DIAGNOSIS — N184 Chronic kidney disease, stage 4 (severe): Secondary | ICD-10-CM | POA: Diagnosis not present

## 2022-04-04 DIAGNOSIS — N179 Acute kidney failure, unspecified: Secondary | ICD-10-CM | POA: Diagnosis not present

## 2022-04-04 NOTE — Patient Instructions (Signed)
Medication Instructions:  No changes *If you need a refill on your cardiac medications before your next appointment, please call your pharmacy*   Lab Work: Your provider would like for you to have the following labs today: BMET  If you have labs (blood work) drawn today and your tests are completely normal, you will receive your results only by: [place] (if you have MyChart) OR A paper copy in the mail If you have any lab test that is abnormal or we need to change your treatment, we will call you to review the results.   Testing/Procedures: None ordered   Follow-Up: At [facility], you and your health needs are our priority.  As part of our continuing mission to provide you with exceptional heart care, we have created designated Provider Care Teams.  These Care Teams include your primary Cardiologist (physician) and Advanced Practice Providers (APPs -  Physician Assistants and Nurse Practitioners) who all work together to provide you with the care you need, when you need it.  We recommend signing up for the patient portal called "MyChart".  Sign up information is provided on this After Visit Summary.  MyChart is used to connect with patients for Virtual Visits (Telemedicine).  Patients are able to view lab/test results, encounter notes, upcoming appointments, etc.  Non-urgent messages can be sent to your provider as well.   To learn more about what you can do with MyChart, go to [email or web address].    Your next appointment:   One month with an APP  Important Information About Sugar

## 2022-04-05 LAB — BASIC METABOLIC PANEL
BUN/Creatinine Ratio: 17 (ref 10–24)
BUN: 63 mg/dL — ABNORMAL HIGH (ref 8–27)
CO2: 20 mmol/L (ref 20–29)
Calcium: 9.4 mg/dL (ref 8.6–10.2)
Chloride: 103 mmol/L (ref 96–106)
Creatinine, Ser: 3.69 mg/dL — ABNORMAL HIGH (ref 0.76–1.27)
Glucose: 97 mg/dL (ref 70–99)
Potassium: 5 mmol/L (ref 3.5–5.2)
Sodium: 141 mmol/L (ref 134–144)
eGFR: 17 mL/min/{1.73_m2} — ABNORMAL LOW (ref >59)

## 2022-04-10 ENCOUNTER — Institutional Professional Consult (permissible substitution): Payer: Medicare Other | Admitting: Internal Medicine

## 2022-04-11 DIAGNOSIS — N184 Chronic kidney disease, stage 4 (severe): Secondary | ICD-10-CM | POA: Diagnosis not present

## 2022-04-16 ENCOUNTER — Other Ambulatory Visit: Payer: Self-pay

## 2022-04-16 DIAGNOSIS — I129 Hypertensive chronic kidney disease with stage 1 through stage 4 chronic kidney disease, or unspecified chronic kidney disease: Secondary | ICD-10-CM | POA: Diagnosis not present

## 2022-04-16 DIAGNOSIS — E1165 Type 2 diabetes mellitus with hyperglycemia: Secondary | ICD-10-CM

## 2022-04-16 DIAGNOSIS — N184 Chronic kidney disease, stage 4 (severe): Secondary | ICD-10-CM | POA: Diagnosis not present

## 2022-04-16 DIAGNOSIS — N2581 Secondary hyperparathyroidism of renal origin: Secondary | ICD-10-CM | POA: Diagnosis not present

## 2022-04-16 DIAGNOSIS — D631 Anemia in chronic kidney disease: Secondary | ICD-10-CM | POA: Diagnosis not present

## 2022-04-16 MED ORDER — INSULIN ASPART 100 UNIT/ML IJ SOLN: INTRAMUSCULAR | 2 refills | Status: DC

## 2022-04-22 ENCOUNTER — Ambulatory Visit: Payer: Medicare Other | Admitting: General Practice

## 2022-05-05 NOTE — Progress Notes (Unsigned)
Cardiology Clinic Note   Patient Name: Alec [name] Date of Encounter: 05/07/2022  Primary Care Provider:  Lupita Raider, MD Primary Cardiologist:  Rollene Rotunda, MD  Patient Profile    Alec [name] presents to the clinic today for follow-up evaluation of his chronic diastolic CHF.  Past Medical History    Past Medical History:  Diagnosis Date   Anemia    Anxiety    Aortic insufficiency    Arthritis    Bilateral lower extremity edema    Chronic gout    06-19-2020  per pt last episode 6 months , great toe   CKD (chronic kidney disease), stage IV [facility])    Coronary artery disease cardiologist--- dr hochrein   CABG 2019   Degenerative arthritis of shoulder region 08/2013   left   History of bladder cancer 07/2008   s/p  TURBT   History of cellulitis 11/2017   left upper arm   History of kidney stones    Hyperlipidemia    Hypertension    followed by pcp/ cardiology   IDDM (insulin dependent diabetes mellitus)    endocrinologist--- dr Lucianne Muss---  pt uses insulin pump and dexcom  (06-19-2020 per pt fasting sugar-- 98--110)   Insulin pump in place    Mitral regurgitation    OSA (obstructive sleep apnea)    Prostate cancer [facility]) urologist--- dr herrick/  oncologist--- [place]   dx 11/ 2017  Gleason 3+3 active survillance;  bx 03-14-2020 Gleason 3+4   RA (rheumatoid arthritis) (HCC)    rhemotologist--- dr Marylyn Ishihara---     S/P CABG x 3 07/15/2018   LIMA to LAD;  SVG to PDA;  SVG to RI   Thrombocytopenia [facility])    Past Surgical History:  Procedure Laterality Date   CHOLECYSTECTOMY  08/29/2012   Procedure: LAPAROSCOPIC CHOLECYSTECTOMY WITH INTRAOPERATIVE CHOLANGIOGRAM;  Surgeon: Wilmon Arms. Tsuei, MD;  Location: [facility];  Service: General;  Laterality: N/A;   CORONARY ARTERY BYPASS GRAFT N/A 07/20/2018   Procedure: CORONARY ARTERY BYPASS GRAFTING (CABG) times three using left internal mammary artery to the LAD, and using endoscopically harvested right saphenous vein  to PDA and intermedius.;  Surgeon: Delight Ovens, MD;  Location: [facility] OR;  Service: Open Heart Surgery;  Laterality: N/A;   LEFT HEART CATH AND CORONARY ANGIOGRAPHY N/A 07/15/2018   Procedure: LEFT HEART CATH AND CORONARY ANGIOGRAPHY;  Surgeon: Marykay Lex, MD;  Location: [facility] INVASIVE CV LAB;  Service: Cardiovascular;  Laterality: N/A;   RADIOACTIVE SEED IMPLANT N/A 06/21/2020   Procedure: RADIOACTIVE SEED IMPLANT/BRACHYTHERAPY IMPLANT;  Surgeon: Crist Fat, MD;  Location: [facility];  Service: Urology;  Laterality: N/A;   SHOULDER ARTHROSCOPY W/ ROTATOR CUFF REPAIR Right 09/03/2013   SHOULDER ARTHROSCOPY WITH SUBACROMIAL DECOMPRESSION, ROTATOR CUFF REPAIR AND BICEP TENDON REPAIR Left 09/03/2013   Procedure: LEFT SHOULDER ARTHROSCOPY WITH EXTENSIVE DEBRIDMENT, DISTAL CLAVICULECTOMY, ROTATOR CUFF REPAIR AND SUBACROMIAL DECOMPRESSION PARTIAL ACRIOMIOPLASTY WITH CORACOACROMIAL RELEASE;  Surgeon: Sheral Apley, MD;  Location: [facility];  Service: Orthopedics;  Laterality: Left;   SPACE OAR INSTILLATION N/A 06/21/2020   Procedure: SPACE OAR INSTILLATION;  Surgeon: Crist Fat, MD;  Location: [facility];  Service: Urology;  Laterality: N/A;   TEE WITHOUT CARDIOVERSION N/A 07/20/2018   Procedure: TRANSESOPHAGEAL ECHOCARDIOGRAM (TEE);  Surgeon: Delight Ovens, MD;  Location: [facility] OR;  Service: Open Heart Surgery;  Laterality: N/A;   TRANSURETHRAL RESECTION OF BLADDER TUMOR WITH MITOMYCIN-C  08/24/2008   @[facility]   Allergies  Allergies  Allergen Reactions   Nsaids Other (See Comments)    Told to avoid by dr     History of Present Illness    Alec [name] is a PMH of HTN, HLD, coronary artery disease status post CABG x3, type 2 diabetes, bradycardia, chronic diastolic CHF, prostate CA, and CKD.  He was admitted to the hospital on 03/27/2022 and discharged on 03/29/2022.  He was diagnosed with acute on chronic diastolic CHF.  He  reported that over the previous 2 weeks he had noticed increasing shortness of breath both at rest and with exertion.  He reported PND.  He denied lower extremity edema.  He denied chest pain.  He did report a productive cough with wheezing.  He was not noted to have fever and reported compliance with his medications.  In the emergency department he was noted to be mildly hypertensive with blood pressure of 160/60.  His BNP was elevated at 1019.  His high-sensitivity troponins were elevated at 19 and 22.  A chest x-ray showed central pulmonary vessel prominence which was consistent with CHF.  His creatinine was 2.46.  His baseline creatinine is somewhere around 1.9.  He received IV furosemide and was transferred to [facility] for further management.  He did not require supplemental oxygen.  He had good urine output.  His creatinine rose to 3.3.  It was recommended that he have a Lasix holiday for suspected overdiuresis.  He was not restarted on olmesartan due to worsening renal function.  Furosemide 20 mg daily was recommended at discharge.  He presents to the clinic today for follow-up evaluation states he has noticed some dizziness in the mornings over the past 2 weeks.  His blood pressure in clinic today is 94/42.  We reviewed his medications.  He and his wife expressed understanding.  We reviewed his recent hospitalization.  He has been weaned himself daily at home.  I will have him hold his hydralazine for systolic blood pressure less than 120.  I will give him a salty 6 diet sheet.  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  His creatinine is slowly improving.  We will plan follow-up in 3 to 4 months.  Today he denies chest pain, shortness of breath, lower extremity edema, fatigue, palpitations, melena, hematuria, hemoptysis, diaphoresis, weakness, presyncope, syncope, orthopnea, and PND.   Home Medications    Prior to Admission medications   Medication Sig Start Date  End Date Taking? Authorizing Provider  allopurinol (ZYLOPRIM) 100 MG tablet Take 200 mg by mouth daily.     [provider]  aspirin EC 81 MG tablet Take 81 mg by mouth daily.    [provider]  atorvastatin (LIPITOR) 40 MG tablet Take 1 tablet (40 mg total) by mouth daily. 11/03/20   Jodelle Gross, NP  Blood Glucose Monitoring Suppl (FREESTYLE FREEDOM LITE) w/Device KIT Use to check blood sugar three times daily. DX:E11.65 Patient not taking: Reported on 04/04/2022 02/02/20   Reather Littler, MD  Boswellia-Glucosamine-Vit D (OSTEO BI-FLEX ONE PER DAY PO) Take 1 tablet by mouth daily.    [provider]  Cholecalciferol (VITAMIN D3 PO) Take 1 capsule by mouth daily.    [provider]  CINNAMON PO Take 500 mg by mouth daily.    [provider]  cloNIDine (CATAPRES) 0.1 MG tablet Take 1 tablet (0.1 mg total) by mouth 2 (two) times daily. 02/28/21   Rollene Rotunda, MD  furosemide (LASIX) 20 MG tablet Take 1 tablet (20 mg total) by mouth daily. 03/30/22 03/30/23  Barton Dubois, MD  hydrALAZINE (APRESOLINE) 100 MG tablet Take 1 tablet (100 mg total) by mouth 3 (three) times daily. Patient not taking: Reported on 04/04/2022 03/29/22   Barton Dubois, MD  insulin aspart (NOVOLOG) 100 UNIT/ML injection Use max of 85 units daily via insulin pump 04/16/22   Elayne Snare, MD  isosorbide mononitrate (IMDUR) 120 MG 24 hr tablet Take 1 tablet (120 mg total) by mouth daily. 12/20/21 12/15/22  Minus Breeding, MD  latanoprost (XALATAN) 0.005 % ophthalmic solution Place 1 drop into both eyes at bedtime. 09/18/21   [provider]  metoprolol tartrate (LOPRESSOR) 50 MG tablet TAKE 1 TABLET BY MOUTH TWICE A DAY Patient taking differently: Take 50 mg by mouth 2 (two) times daily. 06/11/21   Minus Breeding, MD  Misc Natural Products (TART CHERRY ADVANCED PO) Take 1 tablet by mouth daily.    [provider]  Misc Natural Products (TART CHERRY ADVANCED) CAPS Take 1,000  mg by mouth daily in the afternoon.    [provider]  Multiple Vitamins-Minerals (CENTRUM SILVER PO) Take 1 tablet by mouth daily.    [provider]  nitroGLYCERIN (NITROSTAT) 0.4 MG SL tablet Place 1 tablet (0.4 mg total) under the tongue every 5 (five) minutes as needed for chest pain. Patient not taking: Reported on 04/04/2022 10/26/21 03/27/22  Minus Breeding, MD  sertraline (ZOLOFT) 100 MG tablet Take 100 mg by mouth daily. 02/25/20   [provider]  tamsulosin (FLOMAX) 0.4 MG CAPS capsule Take 0.4 mg by mouth at bedtime.    [provider]    Family History    Family History  Problem Relation Age of Onset   Diabetes Mother    Lung cancer Mother    Cancer Father    Coronary artery disease Father        MI at age 52.    Lung cancer Father    Sudden death Son    CAD Maternal Grandfather        MI in his 64's   CAD Paternal Grandfather    Breast cancer Neg Hx    Colon cancer Neg Hx    Pancreatic cancer Neg Hx    He indicated that his mother is deceased. He indicated that his father is deceased. He indicated that all of his three brothers are alive. He indicated that his maternal grandmother is deceased. He indicated that his maternal grandfather is deceased. He indicated that his paternal grandmother is deceased. He indicated that his paternal grandfather is deceased. He indicated that his daughter is alive. He indicated that his son is deceased. He indicated that the status of his neg hx is unknown.  Social History    Social History   Socioeconomic History   Marital status: Married    Spouse name: Not on file   Number of children: Not on file   Years of education: Not on file   Highest education level: Not on file  Occupational History   Not on file  Tobacco Use   Smoking status: Former    Packs/day: 2.00    Years: 25.00    Total pack years: 50.00    Types: Cigarettes    Quit date: 09/11/1997    Years since quitting: 24.6    Smokeless tobacco: Never  Vaping Use   Vaping Use: Never used  Substance and Sexual Activity   Alcohol use: Not  Currently   Drug use: Never   Sexual activity: Yes  Other Topics Concern   Not on file  Social History Narrative   Not on file   Social Determinants of Health   Financial Resource Strain: Not on file  Food Insecurity: Not on file  Transportation Needs: Not on file  Physical Activity: Not on file  Stress: Not on file  Social Connections: Not on file  Intimate Partner Violence: Not on file     Review of Systems    General:  No chills, fever, night sweats or weight changes.  Cardiovascular:  No chest pain, dyspnea on exertion, edema, orthopnea, palpitations, paroxysmal nocturnal dyspnea. Dermatological: No rash, lesions/masses Respiratory: No cough, dyspnea Urologic: No hematuria, dysuria Abdominal:   No nausea, vomiting, diarrhea, bright red blood per rectum, melena, or hematemesis Neurologic:  No visual changes, wkns, changes in mental status. All other systems reviewed and are otherwise negative except as noted above.  Physical Exam    VS:  BP (!) 94/42   Pulse (!) 44   Ht $R'5\' 6"'XW$  (1.676 m)   Wt 190 lb 3.2 oz (86.3 kg)   SpO2 95%   BMI 30.70 kg/m  , BMI Body mass index is 30.7 kg/m. GEN: Well nourished, well developed, in no acute distress. HEENT: normal. Neck: Supple, no JVD, carotid bruits, or masses. Cardiac: RRR, no murmurs, rubs, or gallops. No clubbing, cyanosis, edema.  Radials/DP/PT 2+ and equal bilaterally.  Respiratory:  Respirations regular and unlabored, clear to auscultation bilaterally. GI: Soft, nontender, nondistended, BS + x 4. MS: no deformity or atrophy. Skin: warm and dry, no rash. Neuro:  Strength and sensation are intact. Psych: Normal affect.  Accessory Clinical Findings    Recent Labs: 05/25/2021: ALT 27 03/27/2022: B Natriuretic Peptide 1,019.1 03/29/2022: Hemoglobin 10.0; Platelets 123; TSH 2.735 04/04/2022: BUN 63; Creatinine,  Ser 3.69; Potassium 5.0; Sodium 141   Recent Lipid Panel    Component Value Date/Time   CHOL 102 03/29/2022 0217   CHOL 116 11/03/2020 0908   TRIG 107 03/29/2022 0217   HDL 24 (L) 03/29/2022 0217   HDL 33 (L) 11/03/2020 0908   CHOLHDL 4.3 03/29/2022 0217   VLDL 21 03/29/2022 0217   LDLCALC 57 03/29/2022 0217   LDLCALC 63 11/03/2020 0908    ECG personally reviewed by me today-none today.  Echocardiogram 03/28/2022  IMPRESSIONS     1. Left ventricular ejection fraction, by estimation, is 55 to 60%. The  left ventricle has normal function. The left ventricle has no regional  wall motion abnormalities. Left ventricular diastolic parameters are  consistent with Grade II diastolic  dysfunction (pseudonormalization).   2. Right ventricular systolic function is normal. The right ventricular  size is normal. Tricuspid regurgitation signal is inadequate for assessing  PA pressure.   3. Left atrial size was mildly dilated.   4. The mitral valve is normal in structure. Mild mitral valve  regurgitation. No evidence of mitral stenosis.   5. The aortic valve is tricuspid. Aortic valve regurgitation is trivial.  Aortic valve sclerosis/calcification is present, without any evidence of  aortic stenosis. Aortic valve Vmax measures 1.39 m/s.   6. Pulmonic valve regurgitation is moderate.   7. The inferior vena cava is dilated in size with >50% respiratory  variability, suggesting right atrial pressure of 8 mmHg.   Assessment & Plan   1.  Chronic diastolic CHF-euvolemic today.  Weight today190 lbs.  No increased DOE or activity intolerance.  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 Continue hydralazine, metoprolol, Heart healthy low-sodium diet-salty 6 given Increase physical activity as tolerated Daily weights-contact office weight increase of 2-3 pounds overnight or 5 pounds in 1 week  Coronary artery disease-denies chest pain.  Status post CABG x3 (2019). Continue  aspirin, Lipitor, Imdur, metoprolol Heart healthy low-sodium diet-salty 6 given Increase physical activity as tolerated  Hyperlipidemia-LDL 57 on 03/29/22 Continue aspirin, Lipitor Heart healthy low-sodium high-fiber diet Increase physical activity as tolerated  Essential hypertension-BP today 94/42.  Has not noted dizziness in the mornings over the past 2 weeks. Continue hydralazine, metoprolol  May hold hydralazine for blood pressure less than 834 systolic. Heart healthy low-sodium diet-salty 6 given Increase physical activity as tolerated  CKD-creatinine 3.69 on 04/04/2022.  Baseline creatinine around 1.90. Avoid nephrotoxic agents Follows with PCP  Disposition: Follow-up with Dr. Percival Spanish in 3-4 months.   Jossie Ng. Tiera Mensinger NP-C     05/07/2022, 8:08 AM [place] [address] Office [phone] Fax [phone]  Notice: This dictation was prepared with Dragon dictation along with smaller phrase technology. Any transcriptional errors that result from this process are unintentional and may not be corrected upon review.  I spent 14 minutes examining this patient, reviewing medications, and using patient centered shared decision making involving her cardiac care.  Prior to her visit I spent greater than 20 minutes reviewing her past medical history,  medications, and prior cardiac tests.

## 2022-05-07 ENCOUNTER — Ambulatory Visit (INDEPENDENT_AMBULATORY_CARE_PROVIDER_SITE_OTHER): Payer: Medicare Other | Admitting: General Practice

## 2022-05-07 ENCOUNTER — Encounter: Payer: Self-pay | Admitting: General Practice

## 2022-05-07 VITALS — BP 94/42 | HR 44 | Ht 66.0 in | Wt 190.2 lb

## 2022-05-07 DIAGNOSIS — I5033 Acute on chronic diastolic (congestive) heart failure: Secondary | ICD-10-CM

## 2022-05-07 DIAGNOSIS — N184 Chronic kidney disease, stage 4 (severe): Secondary | ICD-10-CM

## 2022-05-07 DIAGNOSIS — I2510 Atherosclerotic heart disease of native coronary artery without angina pectoris: Secondary | ICD-10-CM

## 2022-05-07 DIAGNOSIS — I10 Essential (primary) hypertension: Secondary | ICD-10-CM

## 2022-05-07 DIAGNOSIS — E785 Hyperlipidemia, unspecified: Secondary | ICD-10-CM

## 2022-05-07 NOTE — Patient Instructions (Signed)
Medication Instructions:  MAY HOLD AM DOSE OF HYDRALAZINE FOR BLOOD PRESSURE <120  *If you need a refill on your cardiac medications before your next appointment, please call your pharmacy*  Lab Work:   Testing/Procedures:  NONE    NONE  If you have labs (blood work) drawn today and your tests are completely normal, you will receive your results only by: [place] (if you have MyChart) OR  A paper copy in the mail If you have any lab test that is abnormal or we need to change your treatment, we will call you to review the results.  You may also go to any of these [facility] locations:  [place] [address],  [place] [address] ([facility]) [address]  [place] [address] [employer] [facility])  Special Instructions PLEASE READ AND FOLLOW SALTY 6-ATTACHED-1,'800mg'$  daily  PLEASE MAINTAIN PHYSICAL ACTIVITY AS TOLERATED   TAKE AND LOG YOUR WEIGHT AND BLOOD PRESSURE DAILY  Follow-Up: Your next appointment:  3-4 month(s) In Person with Minus Breeding, MD    At [facility], you and your health needs are our priority.  As part of our continuing mission to provide you with exceptional heart care, we have created designated Provider Care Teams.  These Care Teams include your primary Cardiologist (physician) and Advanced Practice Providers (APPs -  Physician Assistants and Nurse Practitioners) who all work together to provide you with the care you need, when you need it.  Important Information About Sugar             6 SALTY THINGS TO AVOID     1,'800MG'$  DAILY

## 2022-05-13 ENCOUNTER — Telehealth: Payer: Self-pay | Admitting: Cardiology

## 2022-05-13 ENCOUNTER — Other Ambulatory Visit: Payer: Self-pay

## 2022-05-13 MED ORDER — METOPROLOL TARTRATE 50 MG PO TABS: 50.0000 mg | ORAL_TABLET | Freq: Two times a day (BID) | ORAL | 3 refills | Status: DC

## 2022-05-13 MED ORDER — METOPROLOL TARTRATE 50 MG PO TABS: 50.0000 mg | ORAL_TABLET | Freq: Two times a day (BID) | ORAL | 1 refills | Status: DC

## 2022-05-13 NOTE — Telephone Encounter (Signed)
*  STAT* If patient is at the pharmacy, call can be transferred to refill team.   1. Which medications need to be refilled? (please list name of each medication and dose if known) metoprolol tartrate (LOPRESSOR) 50 MG tablet  2. Which pharmacy/location (including street and city if local pharmacy) is medication to be sent to? [id number] - [place], [place] - [address]  3. Do they need a 30 day or 90 day supply? 90  

## 2022-05-21 ENCOUNTER — Telehealth: Payer: Self-pay | Admitting: Cardiology

## 2022-05-21 MED ORDER — ATORVASTATIN CALCIUM 40 MG PO TABS: 40.0000 mg | ORAL_TABLET | Freq: Every day | ORAL | 3 refills | Status: DC

## 2022-05-21 NOTE — Telephone Encounter (Signed)
*  STAT* If patient is at the pharmacy, call can be transferred to refill team.   1. Which medications need to be refilled? (please list name of each medication and dose if known)   atorvastatin (LIPITOR) 40 MG tablet    2. Which pharmacy/location (including street and city if local pharmacy) is medication to be sent to?  [id number] [place], [place] - [address] 3. Do they need a 30 day or 90 day supply?  90 day

## 2022-05-24 DIAGNOSIS — M5135 Other intervertebral disc degeneration, thoracolumbar region: Secondary | ICD-10-CM | POA: Diagnosis not present

## 2022-05-24 DIAGNOSIS — M5386 Other specified dorsopathies, lumbar region: Secondary | ICD-10-CM | POA: Diagnosis not present

## 2022-05-24 DIAGNOSIS — M9903 Segmental and somatic dysfunction of lumbar region: Secondary | ICD-10-CM | POA: Diagnosis not present

## 2022-05-24 DIAGNOSIS — M9902 Segmental and somatic dysfunction of thoracic region: Secondary | ICD-10-CM | POA: Diagnosis not present

## 2022-05-24 DIAGNOSIS — M5417 Radiculopathy, lumbosacral region: Secondary | ICD-10-CM | POA: Diagnosis not present

## 2022-05-24 DIAGNOSIS — M9904 Segmental and somatic dysfunction of sacral region: Secondary | ICD-10-CM | POA: Diagnosis not present

## 2022-05-31 ENCOUNTER — Telehealth: Payer: Self-pay | Admitting: Cardiology

## 2022-05-31 MED ORDER — SERTRALINE HCL 100 MG PO TABS: 100.0000 mg | ORAL_TABLET | Freq: Every day | ORAL | 3 refills | Status: DC

## 2022-05-31 MED ORDER — CLONIDINE HCL 0.1 MG PO TABS: 0.1000 mg | ORAL_TABLET | Freq: Two times a day (BID) | ORAL | 11 refills | Status: DC

## 2022-05-31 NOTE — Telephone Encounter (Signed)
*  STAT* If patient is at the pharmacy, call can be transferred to refill team.   1. Which medications need to be refilled? (please list name of each medication and dose if known)  cloNIDine (CATAPRES) 0.1 MG tablet sertraline (ZOLOFT) 100 MG tablet  2. Which pharmacy/location (including street and city if local pharmacy) is medication to be sent to? [id number] [place], [place] - [address]  3. Do they need a 30 day or 90 day supply? 90 day

## 2022-06-07 DIAGNOSIS — M9903 Segmental and somatic dysfunction of lumbar region: Secondary | ICD-10-CM | POA: Diagnosis not present

## 2022-06-07 DIAGNOSIS — M5135 Other intervertebral disc degeneration, thoracolumbar region: Secondary | ICD-10-CM | POA: Diagnosis not present

## 2022-06-07 DIAGNOSIS — M9902 Segmental and somatic dysfunction of thoracic region: Secondary | ICD-10-CM | POA: Diagnosis not present

## 2022-06-07 DIAGNOSIS — M5386 Other specified dorsopathies, lumbar region: Secondary | ICD-10-CM | POA: Diagnosis not present

## 2022-06-07 DIAGNOSIS — M9904 Segmental and somatic dysfunction of sacral region: Secondary | ICD-10-CM | POA: Diagnosis not present

## 2022-06-07 DIAGNOSIS — M5417 Radiculopathy, lumbosacral region: Secondary | ICD-10-CM | POA: Diagnosis not present

## 2022-06-07 DIAGNOSIS — N184 Chronic kidney disease, stage 4 (severe): Secondary | ICD-10-CM | POA: Diagnosis not present

## 2022-06-12 ENCOUNTER — Other Ambulatory Visit (INDEPENDENT_AMBULATORY_CARE_PROVIDER_SITE_OTHER): Payer: Medicare Other

## 2022-06-12 ENCOUNTER — Other Ambulatory Visit: Payer: Medicare Other

## 2022-06-12 DIAGNOSIS — N184 Chronic kidney disease, stage 4 (severe): Secondary | ICD-10-CM | POA: Diagnosis not present

## 2022-06-12 DIAGNOSIS — M9904 Segmental and somatic dysfunction of sacral region: Secondary | ICD-10-CM | POA: Diagnosis not present

## 2022-06-12 DIAGNOSIS — I129 Hypertensive chronic kidney disease with stage 1 through stage 4 chronic kidney disease, or unspecified chronic kidney disease: Secondary | ICD-10-CM | POA: Diagnosis not present

## 2022-06-12 DIAGNOSIS — M9903 Segmental and somatic dysfunction of lumbar region: Secondary | ICD-10-CM | POA: Diagnosis not present

## 2022-06-12 DIAGNOSIS — M5386 Other specified dorsopathies, lumbar region: Secondary | ICD-10-CM | POA: Diagnosis not present

## 2022-06-12 DIAGNOSIS — M5135 Other intervertebral disc degeneration, thoracolumbar region: Secondary | ICD-10-CM | POA: Diagnosis not present

## 2022-06-12 DIAGNOSIS — D631 Anemia in chronic kidney disease: Secondary | ICD-10-CM | POA: Diagnosis not present

## 2022-06-12 DIAGNOSIS — M5417 Radiculopathy, lumbosacral region: Secondary | ICD-10-CM | POA: Diagnosis not present

## 2022-06-12 DIAGNOSIS — Z794 Long term (current) use of insulin: Secondary | ICD-10-CM

## 2022-06-12 DIAGNOSIS — M9902 Segmental and somatic dysfunction of thoracic region: Secondary | ICD-10-CM | POA: Diagnosis not present

## 2022-06-12 DIAGNOSIS — N2581 Secondary hyperparathyroidism of renal origin: Secondary | ICD-10-CM | POA: Diagnosis not present

## 2022-06-12 DIAGNOSIS — E1165 Type 2 diabetes mellitus with hyperglycemia: Secondary | ICD-10-CM | POA: Diagnosis not present

## 2022-06-12 LAB — BASIC METABOLIC PANEL
BUN: 64 mg/dL — ABNORMAL HIGH (ref 6–23)
CO2: 26 mEq/L (ref 19–32)
Calcium: 9.7 mg/dL (ref 8.4–10.5)
Chloride: 103 mEq/L (ref 96–112)
Creatinine, Ser: 3.73 mg/dL — ABNORMAL HIGH (ref 0.40–1.50)
GFR: 15.34 mL/min — ABNORMAL LOW (ref >60.00)
Glucose, Bld: 97 mg/dL (ref 70–99)
Potassium: 4.4 mEq/L (ref 3.5–5.1)
Sodium: 139 mEq/L (ref 135–145)

## 2022-06-12 LAB — HEMOGLOBIN A1C: Hgb A1c MFr Bld: 6.3 % (ref 4.6–6.5)

## 2022-06-13 LAB — FRUCTOSAMINE: Fructosamine: 260 umol/L (ref 0–285)

## 2022-06-14 DIAGNOSIS — M9904 Segmental and somatic dysfunction of sacral region: Secondary | ICD-10-CM | POA: Diagnosis not present

## 2022-06-14 DIAGNOSIS — M9903 Segmental and somatic dysfunction of lumbar region: Secondary | ICD-10-CM | POA: Diagnosis not present

## 2022-06-14 DIAGNOSIS — M5135 Other intervertebral disc degeneration, thoracolumbar region: Secondary | ICD-10-CM | POA: Diagnosis not present

## 2022-06-14 DIAGNOSIS — M5417 Radiculopathy, lumbosacral region: Secondary | ICD-10-CM | POA: Diagnosis not present

## 2022-06-14 DIAGNOSIS — M9902 Segmental and somatic dysfunction of thoracic region: Secondary | ICD-10-CM | POA: Diagnosis not present

## 2022-06-14 DIAGNOSIS — M5386 Other specified dorsopathies, lumbar region: Secondary | ICD-10-CM | POA: Diagnosis not present

## 2022-06-15 ENCOUNTER — Other Ambulatory Visit: Payer: Self-pay | Admitting: Cardiology

## 2022-06-18 ENCOUNTER — Ambulatory Visit (INDEPENDENT_AMBULATORY_CARE_PROVIDER_SITE_OTHER): Payer: Medicare Other | Admitting: Endocrinology

## 2022-06-18 ENCOUNTER — Encounter: Payer: Self-pay | Admitting: Endocrinology

## 2022-06-18 VITALS — BP 142/67 | HR 51 | Ht 66.0 in | Wt 194.2 lb

## 2022-06-18 DIAGNOSIS — E1121 Type 2 diabetes mellitus with diabetic nephropathy: Secondary | ICD-10-CM | POA: Diagnosis not present

## 2022-06-18 DIAGNOSIS — M9902 Segmental and somatic dysfunction of thoracic region: Secondary | ICD-10-CM | POA: Diagnosis not present

## 2022-06-18 DIAGNOSIS — E1165 Type 2 diabetes mellitus with hyperglycemia: Secondary | ICD-10-CM

## 2022-06-18 DIAGNOSIS — I2510 Atherosclerotic heart disease of native coronary artery without angina pectoris: Secondary | ICD-10-CM

## 2022-06-18 DIAGNOSIS — Z794 Long term (current) use of insulin: Secondary | ICD-10-CM | POA: Diagnosis not present

## 2022-06-18 DIAGNOSIS — M9903 Segmental and somatic dysfunction of lumbar region: Secondary | ICD-10-CM | POA: Diagnosis not present

## 2022-06-18 DIAGNOSIS — M5386 Other specified dorsopathies, lumbar region: Secondary | ICD-10-CM | POA: Diagnosis not present

## 2022-06-18 DIAGNOSIS — M5135 Other intervertebral disc degeneration, thoracolumbar region: Secondary | ICD-10-CM | POA: Diagnosis not present

## 2022-06-18 DIAGNOSIS — M9904 Segmental and somatic dysfunction of sacral region: Secondary | ICD-10-CM | POA: Diagnosis not present

## 2022-06-18 DIAGNOSIS — M5417 Radiculopathy, lumbosacral region: Secondary | ICD-10-CM | POA: Diagnosis not present

## 2022-06-18 NOTE — Patient Instructions (Addendum)
Bolus BEFORE MEALS  bOLUS UPTO 12 units for fried food. Bolus for sweet fruits

## 2022-06-18 NOTE — Progress Notes (Unsigned)
Patient ID: Alec [name], male   DOB: 01-04-1946, 76 y.o.   MRN: [id number]             Reason for Appointment:  Follow-up for Type 2 Diabetes    History of Present Illness:          Diagnosis: Type 2 diabetes mellitus, date of diagnosis: 2001       Prior history: He was seen in consultation in 11/2014 when his A1c was 7.4 Since his blood sugars were averaging over 200 he was given Victoza in addition to his basal bolus insulin regimen in 6/16 He started on the V go pump in October 2016  Recent history:    INSULIN regimen  on T-Slim pump:    BASAL rate settings:  Midnight = 2.0, 3 AM = 2.0, 8 AM = 2.4, 4 PM = 2.7, 7 PM = 3.0 and 10 PM = 2.2  Boluses 12- 16 units at breakfast, 8 units lunch and about 14 at dinner  Non-insulin hypoglycemic drugs the patient is taking are: None   His A1c is lower than expected for his sugars at 6.3 although previously 5.6, GMI about 7   Current blood sugar patterns from analysis of his Dexcom sensor download, daily management and problems identified:   Overall blood sugars are slightly higher than before but this is mostly postprandial Overnight blood sugars are excellent but may have higher readings late at night based on his meals or snacks Most of his high readings at mealtimes are related to late boluses and possibly forgetting some boluses including for snacks  Currently no hypoglycemia Also not using sleep mode overnight with low normal readings early morning Has difficulty affording GLP-1 drugs   Currently is using about 74% insulin boluses and the rest mostly and food boluses   Glucose monitoring:  Dexcom  Summary of patterns: LOWEST blood sugars are early morning between about 4 AM-7 AM and HIGHEST blood sugars are evenings after about 6 PM midday Overnight blood sugars are periodically higher early part of the night but usually significantly lower after about 3 AM with some low normal readings and no significant  hypoglycemia Has inconsistent postprandial HYPERGLYCEMIA after all of his meals but mostly in the evenings after dinner which may persist late in the evening  Also occasionally may have prolonged hyperglycemia at different times but not consistently Occasionally postprandial readings may be relatively flat compared to Premeal readings but no hypoglycemia after boluses   Statistics:    CGM use % of time   2-week average/SD 149  Time in range        71%  % Time Above 180 28  % Time above 250   % Time Below 70 0      PRE-MEAL  overnight  mornings  afternoon  evening Overall  Glucose range:       Averages:  124 144 159 173    Previous data:   CGM use % of time   2-week average/SD 129/35  Time in range       90%  % Time Above 180 9  % Time above 250   % Time Below 70 1     Self-care:  Meals: 3 meals per day. Lunch  1 pm Dinner 6 pm Breakfast is usually an egg sandwich but sometimes biscuits   Eating balanced meals in the evening.  He will have snacks with granola bars  Dietician visit, most recent: 06/2020.               Weight history:  Wt Readings from Last 3 Encounters:  06/18/22 194 lb 3.2 oz (88.1 kg)  05/07/22 190 lb 3.2 oz (86.3 kg)  04/04/22 191 lb 9.6 oz (86.9 kg)    Glycemic control:   Lab Results  Component Value Date   HGBA1C 6.3 06/12/2022   HGBA1C 5.6 01/25/2022   HGBA1C 5.8 (A) 09/25/2021   Lab Results  Component Value Date   MICROALBUR 397.0 (H) 05/25/2021   LDLCALC 57 03/29/2022   CREATININE 3.73 (H) 06/12/2022   Lab Results  Component Value Date   HGB 10.0 (L) 03/29/2022     Past history:  He was not symptomatic at time of diagnosis and was probably treated with Amaryl only.  No details are available of previous management However he thinks he was not given metformin at any time because of his "kidney problem" He had a A1c of over 9% in 2013 and may have been started on insulin at that time. Most likely has been taking  Levemir and NovoLog since then A1c records show his results were over 8% in 2014 and in 4/15 but subsequently down to 7.1 in November 2015 Amaryl was stopped in 7/16  Other active problems are addressed in Review of systems   Allergies as of 06/18/2022       Reactions   Nsaids Other (See Comments)   Told to avoid by dr         Medication List        Accurate as of June 18, 2022 11:59 PM. If you have any questions, ask your nurse or doctor.          allopurinol 100 MG tablet Commonly known as: ZYLOPRIM Take 200 mg by mouth daily.   aspirin EC 81 MG tablet Take 81 mg by mouth daily.   atorvastatin 40 MG tablet Commonly known as: LIPITOR Take 1 tablet (40 mg total) by mouth daily.   CENTRUM SILVER PO Take 1 tablet by mouth daily.   CINNAMON PO Take 500 mg by mouth daily.   cloNIDine 0.1 MG tablet Commonly known as: CATAPRES Take 1 tablet (0.1 mg total) by mouth 2 (two) times daily.   FreeStyle Freedom Lite w/Device Kit Use to check blood sugar three times daily. DX:E11.65   furosemide 20 MG tablet Commonly known as: LASIX Take 1 tablet (20 mg total) by mouth daily.   hydrALAZINE 100 MG tablet Commonly known as: APRESOLINE Take 1 tablet (100 mg total) by mouth 3 (three) times daily.   insulin aspart 100 UNIT/ML injection Commonly known as: novoLOG Use max of 85 units daily via insulin pump   isosorbide mononitrate 120 MG 24 hr tablet Commonly known as: IMDUR Take 1 tablet (120 mg total) by mouth daily.   latanoprost 0.005 % ophthalmic solution Commonly known as: XALATAN Place 1 drop into both eyes at bedtime.   metoprolol tartrate 50 MG tablet Commonly known as: LOPRESSOR Take 1 tablet (50 mg total) by mouth 2 (two) times daily.   nitroGLYCERIN 0.4 MG SL tablet Commonly known as: NITROSTAT Place 1 tablet (0.4 mg total) under the tongue every 5 (five) minutes as needed for chest pain.   OSTEO BI-FLEX ONE PER DAY PO Take 1 tablet by mouth  daily.   sertraline 100 MG tablet Commonly known as: ZOLOFT Take 1 tablet (100 mg total) by mouth daily.   tamsulosin 0.4 MG  Caps capsule Commonly known as: FLOMAX Take 0.4 mg by mouth at bedtime.   TART CHERRY ADVANCED PO Take 1 tablet by mouth daily.   Tart Cherry Advanced Caps Take 1,000 mg by mouth daily in the afternoon.   VITAMIN D3 PO Take 1 capsule by mouth daily.        Allergies:  Allergies  Allergen Reactions   Nsaids Other (See Comments)    Told to avoid by dr     Past Medical History:  Diagnosis Date   Anemia    Anxiety    Aortic insufficiency    Arthritis    Bilateral lower extremity edema    Chronic gout    06-19-2020  per pt last episode 6 months , great toe   CKD (chronic kidney disease), stage IV [facility])    Coronary artery disease cardiologist--- dr hochrein   CABG 2019   Degenerative arthritis of shoulder region 08/2013   left   History of bladder cancer 07/2008   s/p  TURBT   History of cellulitis 11/2017   left upper arm   History of kidney stones    Hyperlipidemia    Hypertension    followed by pcp/ cardiology   IDDM (insulin dependent diabetes mellitus)    endocrinologist--- dr Dwyane Dee---  pt uses insulin pump and dexcom  (06-19-2020 per pt fasting sugar-- 98--110)   Insulin pump in place    Mitral regurgitation    OSA (obstructive sleep apnea)    Prostate cancer [facility]) urologist--- dr herrick/  oncologist--- [place]   dx 11/ 2017  Gleason 3+3 active survillance;  bx 03-14-2020 Gleason 3+4   RA (rheumatoid arthritis) ([place])    rhemotologist--- dr Normajean Glasgow---     S/P CABG x 3 07/15/2018   LIMA to LAD;  SVG to PDA;  SVG to RI   Thrombocytopenia [facility])     Past Surgical History:  Procedure Laterality Date   CHOLECYSTECTOMY  08/29/2012   Procedure: LAPAROSCOPIC CHOLECYSTECTOMY WITH INTRAOPERATIVE CHOLANGIOGRAM;  Surgeon: Imogene Burn. Tsuei, MD;  Location: [facility];  Service: General;  Laterality: N/A;   CORONARY ARTERY BYPASS GRAFT N/A  07/20/2018   Procedure: CORONARY ARTERY BYPASS GRAFTING (CABG) times three using left internal mammary artery to the LAD, and using endoscopically harvested right saphenous vein to PDA and intermedius.;  Surgeon: Grace Isaac, MD;  Location: [place];  Service: Open Heart Surgery;  Laterality: N/A;   LEFT HEART CATH AND CORONARY ANGIOGRAPHY N/A 07/15/2018   Procedure: LEFT HEART CATH AND CORONARY ANGIOGRAPHY;  Surgeon: Leonie Man, MD;  Location: [place] [facility];  Service: Cardiovascular;  Laterality: N/A;   RADIOACTIVE SEED IMPLANT N/A 06/21/2020   Procedure: RADIOACTIVE SEED IMPLANT/BRACHYTHERAPY IMPLANT;  Surgeon: Ardis Hughs, MD;  Location: [facility];  Service: Urology;  Laterality: N/A;   SHOULDER ARTHROSCOPY W/ ROTATOR CUFF REPAIR Right 09/03/2013   SHOULDER ARTHROSCOPY WITH SUBACROMIAL DECOMPRESSION, ROTATOR CUFF REPAIR AND BICEP TENDON REPAIR Left 09/03/2013   Procedure: LEFT SHOULDER ARTHROSCOPY WITH EXTENSIVE DEBRIDMENT, DISTAL CLAVICULECTOMY, ROTATOR CUFF REPAIR AND SUBACROMIAL DECOMPRESSION PARTIAL ACRIOMIOPLASTY WITH CORACOACROMIAL RELEASE;  Surgeon: Renette Butters, MD;  Location: [place];  Service: Orthopedics;  Laterality: Left;   SPACE OAR INSTILLATION N/A 06/21/2020   Procedure: SPACE OAR INSTILLATION;  Surgeon: Ardis Hughs, MD;  Location: [facility];  Service: Urology;  Laterality: N/A;   TEE WITHOUT CARDIOVERSION N/A 07/20/2018   Procedure: TRANSESOPHAGEAL ECHOCARDIOGRAM (TEE);  Surgeon: Grace Isaac, MD;  Location:  [place] OR;  Service: Open Heart Surgery;  Laterality: N/A;   TRANSURETHRAL RESECTION OF BLADDER TUMOR WITH MITOMYCIN-C  08/24/2008   '@[facility]'$     Family History  Problem Relation Age of Onset   Diabetes Mother    Lung cancer Mother    Cancer Father    Coronary artery disease Father        MI at age 64.    Lung cancer Father    Sudden death Son    CAD Maternal Grandfather        MI in  his 2's   CAD Paternal Grandfather    Breast cancer Neg Hx    Colon cancer Neg Hx    Pancreatic cancer Neg Hx     Social History:  reports that he quit smoking about 24 years ago. His smoking use included cigarettes. He has a 50.00 pack-year smoking history. He has never used smokeless tobacco. He reports that he does not currently use alcohol. He reports that he does not use drugs.    Review of Systems      HYPERTENSION: Blood pressure followed by cardiologist and nephrologist He also checks at home  He is not on ACE or ARB drugs  BP Readings from Last 3 Encounters:  06/18/22 (!) 142/67  05/07/22 (!) 94/42  04/04/22 (!) 118/56           Lipids: he is taking Lipitor 40 mg from his PCP Lipids are controlled except for low HDL      Lab Results  Component Value Date   CHOL 102 03/29/2022   HDL 24 (L) 03/29/2022   LDLCALC 57 03/29/2022   TRIG 107 03/29/2022   CHOLHDL 4.3 03/29/2022               CKD: He has been followed by nephrologist for diabetic nephropathy.   Last urine microalbumin ratio is about 200 He is not on an ACE inhibitor/ARB drug  His creatinine is not significantly higher   Lab Results  Component Value Date   CREATININE 3.73 (H) 06/12/2022   CREATININE 3.69 (H) 04/04/2022   CREATININE 3.96 (H) 04/01/2022   Lab Results  Component Value Date   K 4.4 06/12/2022    Last foot exam in 12/22  Has neuropathic symptoms controlled with low-dose gabapentin, also on Requip from PCP   Eye exam last:  6/22, report not available   Physical Examination:  BP (!) 142/67   Pulse (!) 51   Ht $R'5\' 6"'Mu$  (1.676 m)   Wt 194 lb 3.2 oz (88.1 kg)   SpO2 97%   BMI 31.34 kg/m       1+ ankle edema present bilaterally  ASSESSMENT:  Diabetes type 2, insulin requiring  See history of present illness for detailed discussion of his current management, blood sugar patterns and from his Dexcom data and problems identified:  He is using the T-insulin pump  His  A1c is lower than expected at 6.3 Recent GMI about 7  He has less consistent control compared to his last visit Most of this is related to late or inadequate boluses for meals However overnight blood sugars are averaging only 124 without hypoglycemia  LIPIDS: Followed by PCP, last LDL 57    PLAN: Basal rate will be reduced at 3 AM down to 1.8 and this was done in the office under supervision Discussed the need to avoid hyperglycemia after meals and that he should bolus before starting to eat regardless of Premeal blood sugar Also bolus  for all snacks Avoid high glycemic foods including fruits like watermelon Again if he starts getting more active at times he can use a exercise mode Take correction boluses are if blood glucose continues to be high after meals or otherwise higher  Follow-up in 3 months   Patient Instructions  Bolus BEFORE MEALS  bOLUS UPTO 12 units for fried food. Bolus for sweet fruits   Total visit time including counseling = 30 minutes   Elayne Snare 06/19/2022, 8:17 PM   Note: This office note was prepared with Dragon voice recognition system technology. Any transcriptional errors that result from this process are unintentional.

## 2022-06-19 DIAGNOSIS — M9903 Segmental and somatic dysfunction of lumbar region: Secondary | ICD-10-CM | POA: Diagnosis not present

## 2022-06-19 DIAGNOSIS — M5386 Other specified dorsopathies, lumbar region: Secondary | ICD-10-CM | POA: Diagnosis not present

## 2022-06-19 DIAGNOSIS — M5417 Radiculopathy, lumbosacral region: Secondary | ICD-10-CM | POA: Diagnosis not present

## 2022-06-19 DIAGNOSIS — M9904 Segmental and somatic dysfunction of sacral region: Secondary | ICD-10-CM | POA: Diagnosis not present

## 2022-06-19 DIAGNOSIS — M9902 Segmental and somatic dysfunction of thoracic region: Secondary | ICD-10-CM | POA: Diagnosis not present

## 2022-06-19 DIAGNOSIS — M5135 Other intervertebral disc degeneration, thoracolumbar region: Secondary | ICD-10-CM | POA: Diagnosis not present

## 2022-06-26 ENCOUNTER — Other Ambulatory Visit: Payer: Medicare Other

## 2022-06-26 ENCOUNTER — Encounter: Payer: Self-pay | Admitting: Endocrinology

## 2022-06-27 DIAGNOSIS — M5417 Radiculopathy, lumbosacral region: Secondary | ICD-10-CM | POA: Diagnosis not present

## 2022-06-27 DIAGNOSIS — M9902 Segmental and somatic dysfunction of thoracic region: Secondary | ICD-10-CM | POA: Diagnosis not present

## 2022-06-27 DIAGNOSIS — M9904 Segmental and somatic dysfunction of sacral region: Secondary | ICD-10-CM | POA: Diagnosis not present

## 2022-06-27 DIAGNOSIS — M9903 Segmental and somatic dysfunction of lumbar region: Secondary | ICD-10-CM | POA: Diagnosis not present

## 2022-06-27 DIAGNOSIS — M5386 Other specified dorsopathies, lumbar region: Secondary | ICD-10-CM | POA: Diagnosis not present

## 2022-06-27 DIAGNOSIS — M5135 Other intervertebral disc degeneration, thoracolumbar region: Secondary | ICD-10-CM | POA: Diagnosis not present

## 2022-06-28 ENCOUNTER — Ambulatory Visit: Payer: Medicare Other | Admitting: Endocrinology

## 2022-06-28 DIAGNOSIS — M9903 Segmental and somatic dysfunction of lumbar region: Secondary | ICD-10-CM | POA: Diagnosis not present

## 2022-06-28 DIAGNOSIS — M5417 Radiculopathy, lumbosacral region: Secondary | ICD-10-CM | POA: Diagnosis not present

## 2022-06-28 DIAGNOSIS — M5386 Other specified dorsopathies, lumbar region: Secondary | ICD-10-CM | POA: Diagnosis not present

## 2022-06-28 DIAGNOSIS — M9904 Segmental and somatic dysfunction of sacral region: Secondary | ICD-10-CM | POA: Diagnosis not present

## 2022-06-28 DIAGNOSIS — M9902 Segmental and somatic dysfunction of thoracic region: Secondary | ICD-10-CM | POA: Diagnosis not present

## 2022-06-28 DIAGNOSIS — M5135 Other intervertebral disc degeneration, thoracolumbar region: Secondary | ICD-10-CM | POA: Diagnosis not present

## 2022-07-04 DIAGNOSIS — M9902 Segmental and somatic dysfunction of thoracic region: Secondary | ICD-10-CM | POA: Diagnosis not present

## 2022-07-04 DIAGNOSIS — M9903 Segmental and somatic dysfunction of lumbar region: Secondary | ICD-10-CM | POA: Diagnosis not present

## 2022-07-04 DIAGNOSIS — M9904 Segmental and somatic dysfunction of sacral region: Secondary | ICD-10-CM | POA: Diagnosis not present

## 2022-07-04 DIAGNOSIS — M5417 Radiculopathy, lumbosacral region: Secondary | ICD-10-CM | POA: Diagnosis not present

## 2022-07-04 DIAGNOSIS — M5386 Other specified dorsopathies, lumbar region: Secondary | ICD-10-CM | POA: Diagnosis not present

## 2022-07-04 DIAGNOSIS — M5135 Other intervertebral disc degeneration, thoracolumbar region: Secondary | ICD-10-CM | POA: Diagnosis not present

## 2022-07-05 DIAGNOSIS — M9904 Segmental and somatic dysfunction of sacral region: Secondary | ICD-10-CM | POA: Diagnosis not present

## 2022-07-05 DIAGNOSIS — M5135 Other intervertebral disc degeneration, thoracolumbar region: Secondary | ICD-10-CM | POA: Diagnosis not present

## 2022-07-05 DIAGNOSIS — M9903 Segmental and somatic dysfunction of lumbar region: Secondary | ICD-10-CM | POA: Diagnosis not present

## 2022-07-05 DIAGNOSIS — M5386 Other specified dorsopathies, lumbar region: Secondary | ICD-10-CM | POA: Diagnosis not present

## 2022-07-05 DIAGNOSIS — M5417 Radiculopathy, lumbosacral region: Secondary | ICD-10-CM | POA: Diagnosis not present

## 2022-07-05 DIAGNOSIS — M9902 Segmental and somatic dysfunction of thoracic region: Secondary | ICD-10-CM | POA: Diagnosis not present

## 2022-07-12 DIAGNOSIS — M9904 Segmental and somatic dysfunction of sacral region: Secondary | ICD-10-CM | POA: Diagnosis not present

## 2022-07-12 DIAGNOSIS — M9903 Segmental and somatic dysfunction of lumbar region: Secondary | ICD-10-CM | POA: Diagnosis not present

## 2022-07-12 DIAGNOSIS — M5386 Other specified dorsopathies, lumbar region: Secondary | ICD-10-CM | POA: Diagnosis not present

## 2022-07-12 DIAGNOSIS — M5417 Radiculopathy, lumbosacral region: Secondary | ICD-10-CM | POA: Diagnosis not present

## 2022-07-12 DIAGNOSIS — M5135 Other intervertebral disc degeneration, thoracolumbar region: Secondary | ICD-10-CM | POA: Diagnosis not present

## 2022-07-12 DIAGNOSIS — M9902 Segmental and somatic dysfunction of thoracic region: Secondary | ICD-10-CM | POA: Diagnosis not present

## 2022-07-15 ENCOUNTER — Emergency Department (HOSPITAL_BASED_OUTPATIENT_CLINIC_OR_DEPARTMENT_OTHER): Payer: Medicare Other

## 2022-07-15 ENCOUNTER — Other Ambulatory Visit: Payer: Self-pay

## 2022-07-15 ENCOUNTER — Encounter (HOSPITAL_BASED_OUTPATIENT_CLINIC_OR_DEPARTMENT_OTHER): Payer: Self-pay | Admitting: Emergency Medicine

## 2022-07-15 ENCOUNTER — Emergency Department (HOSPITAL_BASED_OUTPATIENT_CLINIC_OR_DEPARTMENT_OTHER)
Admission: EM | Admit: 2022-07-15 | Discharge: 2022-07-15 | Disposition: A | Discharge status: Home / Self Care | Payer: Medicare Other | Attending: Emergency Medicine | Admitting: Emergency Medicine

## 2022-07-15 DIAGNOSIS — E1122 Type 2 diabetes mellitus with diabetic chronic kidney disease: Secondary | ICD-10-CM | POA: Diagnosis not present

## 2022-07-15 DIAGNOSIS — Z7982 Long term (current) use of aspirin: Secondary | ICD-10-CM | POA: Diagnosis not present

## 2022-07-15 DIAGNOSIS — D72819 Decreased white blood cell count, unspecified: Secondary | ICD-10-CM | POA: Insufficient documentation

## 2022-07-15 DIAGNOSIS — I130 Hypertensive heart and chronic kidney disease with heart failure and stage 1 through stage 4 chronic kidney disease, or unspecified chronic kidney disease: Secondary | ICD-10-CM | POA: Diagnosis not present

## 2022-07-15 DIAGNOSIS — N189 Chronic kidney disease, unspecified: Secondary | ICD-10-CM | POA: Diagnosis not present

## 2022-07-15 DIAGNOSIS — Z20822 Contact with and (suspected) exposure to covid-19: Secondary | ICD-10-CM | POA: Insufficient documentation

## 2022-07-15 DIAGNOSIS — I509 Heart failure, unspecified: Secondary | ICD-10-CM | POA: Insufficient documentation

## 2022-07-15 DIAGNOSIS — I2510 Atherosclerotic heart disease of native coronary artery without angina pectoris: Secondary | ICD-10-CM | POA: Diagnosis not present

## 2022-07-15 DIAGNOSIS — J40 Bronchitis, not specified as acute or chronic: Secondary | ICD-10-CM | POA: Insufficient documentation

## 2022-07-15 DIAGNOSIS — Z794 Long term (current) use of insulin: Secondary | ICD-10-CM | POA: Insufficient documentation

## 2022-07-15 DIAGNOSIS — R7989 Other specified abnormal findings of blood chemistry: Secondary | ICD-10-CM | POA: Diagnosis not present

## 2022-07-15 DIAGNOSIS — Z79899 Other long term (current) drug therapy: Secondary | ICD-10-CM | POA: Diagnosis not present

## 2022-07-15 DIAGNOSIS — R0602 Shortness of breath: Secondary | ICD-10-CM | POA: Diagnosis present

## 2022-07-15 LAB — BASIC METABOLIC PANEL
Anion gap: 7 (ref 5–15)
BUN: 36 mg/dL — ABNORMAL HIGH (ref 8–23)
CO2: 25 mmol/L (ref 22–32)
Calcium: 9.1 mg/dL (ref 8.9–10.3)
Chloride: 107 mmol/L (ref 98–111)
Creatinine, Ser: 3.18 mg/dL — ABNORMAL HIGH (ref 0.61–1.24)
GFR, Estimated: 20 mL/min — ABNORMAL LOW (ref >60)
Glucose, Bld: 104 mg/dL — ABNORMAL HIGH (ref 70–99)
Potassium: 3.6 mmol/L (ref 3.5–5.1)
Sodium: 139 mmol/L (ref 135–145)

## 2022-07-15 LAB — RESP PANEL BY RT-PCR (FLU A&B, COVID) ARPGX2
Influenza A by PCR: NEGATIVE
Influenza B by PCR: NEGATIVE
SARS Coronavirus 2 by RT PCR: NEGATIVE

## 2022-07-15 LAB — CBC
HCT: 33.7 % — ABNORMAL LOW (ref 39.0–52.0)
Hemoglobin: 11.5 g/dL — ABNORMAL LOW (ref 13.0–17.0)
MCH: 32.1 pg (ref 26.0–34.0)
MCHC: 34.1 g/dL (ref 30.0–36.0)
MCV: 94.1 fL (ref 80.0–100.0)
Platelets: 108 10*3/uL — ABNORMAL LOW (ref 150–400)
RBC: 3.58 MIL/uL — ABNORMAL LOW (ref 4.22–5.81)
RDW: 14.8 % (ref 11.5–15.5)
WBC: 3.8 10*3/uL — ABNORMAL LOW (ref 4.0–10.5)
nRBC: 0 % (ref 0.0–0.2)

## 2022-07-15 LAB — CBG MONITORING, ED
Glucose-Capillary: 68 mg/dL — ABNORMAL LOW (ref 70–99)
Glucose-Capillary: 125 mg/dL — ABNORMAL HIGH (ref 70–99)

## 2022-07-15 LAB — BRAIN NATRIURETIC PEPTIDE: B Natriuretic Peptide: 778.4 pg/mL — ABNORMAL HIGH (ref 0.0–100.0)

## 2022-07-15 MED ORDER — IPRATROPIUM-ALBUTEROL 0.5-2.5 (3) MG/3ML IN SOLN
3.0000 mL | Freq: Once | RESPIRATORY_TRACT | Status: AC
  Administered 2022-07-15: 3 mL via RESPIRATORY_TRACT
  Filled 2022-07-15: qty 3

## 2022-07-15 MED ORDER — BENZONATATE 100 MG PO CAPS: 100.0000 mg | ORAL_CAPSULE | Freq: Three times a day (TID) | ORAL | 0 refills | Status: DC

## 2022-07-15 MED ORDER — ALBUTEROL SULFATE HFA 108 (90 BASE) MCG/ACT IN AERS
1.0000 | INHALATION_SPRAY | Freq: Four times a day (QID) | RESPIRATORY_TRACT | Status: DC | PRN
  Administered 2022-07-15: 2 via RESPIRATORY_TRACT
  Filled 2022-07-15: qty 6.7

## 2022-07-15 MED ORDER — GUAIFENESIN ER 1200 MG PO TB12: 1.0000 | ORAL_TABLET | Freq: Two times a day (BID) | ORAL | 0 refills | Status: AC

## 2022-07-15 NOTE — Discharge Instructions (Addendum)
Take the medications as prescribed to help with your cough and congestion.  Follow-up with your primary care doctor next week to be rechecked.  Return as needed for worsening symptoms

## 2022-07-15 NOTE — ED Notes (Signed)
Pt provided cola for blood sugar of 68

## 2022-07-15 NOTE — ED Provider Notes (Addendum)
[facility] EMERGENCY DEPARTMENT Provider Note   CSN: [id number] Arrival date & time: 07/15/22  1632     History  Chief Complaint  Patient presents with   Shortness of Breath    Alec [name] is a 76 y.o. male.   Shortness of Breath    Patient presents with complaints of breathing difficulties over the past week.  Patient states he has been having cough and congestion.  He has had a sore throat.  He has had low-grade temperatures.  He has been feeling short of breath and feels like he is wheezing.  Patient denies having any breathing problems although medical records indicate he does have a history of hyperlipidemia, arthritis, diabetes, hypertension, obstructive sleep apnea, coronary artery disease, aortic insufficiency, chronic kidney disease.  Prior records also indicate that he was admitted to the hospital on June 30 for acute on chronic CHF exacerbation  Home Medications Prior to Admission medications   Medication Sig Start Date End Date Taking? Authorizing Provider  benzonatate (TESSALON) 100 MG capsule Take 1 capsule (100 mg total) by mouth every 8 (eight) hours. 07/15/22  Yes Dorie Rank, MD  Guaifenesin 1200 MG TB12 Take 1 tablet (1,200 mg total) by mouth 2 (two) times daily at 10 AM and 5 PM for 7 days. 07/15/22 07/22/22 Yes Dorie Rank, MD  allopurinol (ZYLOPRIM) 100 MG tablet Take 200 mg by mouth daily.     [provider]  aspirin EC 81 MG tablet Take 81 mg by mouth daily.    [provider]  atorvastatin (LIPITOR) 40 MG tablet Take 1 tablet (40 mg total) by mouth daily. 05/21/22   Minus Breeding, MD  Blood Glucose Monitoring Suppl (FREESTYLE FREEDOM LITE) w/Device KIT Use to check blood sugar three times daily. DX:E11.65 02/02/20   Elayne Snare, MD  Boswellia-Glucosamine-Vit D (OSTEO BI-FLEX ONE PER DAY PO) Take 1 tablet by mouth daily.    [provider]  Cholecalciferol (VITAMIN D3 PO) Take 1 capsule by mouth daily.    [provider]  CINNAMON PO Take 500 mg by mouth daily.    [provider]  cloNIDine (CATAPRES) 0.1 MG tablet Take 1 tablet (0.1 mg total) by mouth 2 (two) times daily. 05/31/22   Minus Breeding, MD  furosemide (LASIX) 20 MG tablet Take 1 tablet (20 mg total) by mouth daily. 06/17/22   Minus Breeding, MD  hydrALAZINE (APRESOLINE) 100 MG tablet Take 1 tablet (100 mg total) by mouth 3 (three) times daily. 03/29/22   Barton Dubois, MD  insulin aspart (NOVOLOG) 100 UNIT/ML injection Use max of 85 units daily via insulin pump 04/16/22   Elayne Snare, MD  isosorbide mononitrate (IMDUR) 120 MG 24 hr tablet Take 1 tablet (120 mg total) by mouth daily. 12/20/21 12/15/22  Minus Breeding, MD  latanoprost (XALATAN) 0.005 % ophthalmic solution Place 1 drop into both eyes at bedtime. 09/18/21   [provider]  metoprolol tartrate (LOPRESSOR) 50 MG tablet Take 1 tablet (50 mg total) by mouth 2 (two) times daily. 05/13/22   Minus Breeding, MD  Misc Natural Products (TART CHERRY ADVANCED PO) Take 1 tablet by mouth daily.    [provider]  Misc Natural Products (TART CHERRY ADVANCED) CAPS Take 1,000 mg by mouth daily in the afternoon.    [provider]  Multiple Vitamins-Minerals (CENTRUM SILVER PO) Take 1 tablet by mouth daily.    [provider]  nitroGLYCERIN (NITROSTAT) 0.4 MG SL tablet Place 1 tablet (0.4 mg  total) under the tongue every 5 (five) minutes as needed for chest pain. Patient not taking: Reported on 04/04/2022 10/26/21 03/27/22  Minus Breeding, MD  sertraline (ZOLOFT) 100 MG tablet Take 1 tablet (100 mg total) by mouth daily. 05/31/22   Minus Breeding, MD  tamsulosin (FLOMAX) 0.4 MG CAPS capsule Take 0.4 mg by mouth at bedtime.    [provider]      Allergies    Nsaids    Review of Systems   Review of Systems  Respiratory:  Positive for shortness of breath.     Physical Exam Updated Vital Signs BP (!) 169/61   Pulse 60   Temp 100 F  (37.8 C)   Resp 13   Ht 1.676 m (_0 )   Wt 91.6 kg   SpO2 96%   BMI 32.59 kg/m  Physical Exam Vitals and nursing note reviewed.  Constitutional:      Appearance: He is well-developed. He is not diaphoretic.  HENT:     Head: Normocephalic and atraumatic.     Right Ear: External ear normal.     Left Ear: External ear normal.  Eyes:     General: No scleral icterus.       Right eye: No discharge.        Left eye: No discharge.     Conjunctiva/sclera: Conjunctivae normal.  Neck:     Trachea: No tracheal deviation.  Cardiovascular:     Rate and Rhythm: Normal rate and regular rhythm.  Pulmonary:     Effort: Pulmonary effort is normal. No respiratory distress.     Breath sounds: No stridor. Wheezing present. No rales.  Abdominal:     General: Bowel sounds are normal. There is no distension.     Palpations: Abdomen is soft.     Tenderness: There is no abdominal tenderness. There is no guarding or rebound.  Musculoskeletal:        General: No tenderness or deformity.     Cervical back: Neck supple.     Right lower leg: No edema.     Left lower leg: No edema.  Skin:    General: Skin is warm and dry.     Findings: No rash.  Neurological:     General: No focal deficit present.     Mental Status: He is alert.     Cranial Nerves: No cranial nerve deficit (no facial droop, extraocular movements intact, no slurred speech).     Sensory: No sensory deficit.     Motor: No abnormal muscle tone or seizure activity.     Coordination: Coordination normal.  Psychiatric:        Mood and Affect: Mood normal.     ED Results / Procedures / Treatments   Labs (all labs ordered are listed, but only abnormal results are displayed) Labs Reviewed  CBC - Abnormal; Notable for the following components:      Result Value   WBC 3.8 (*)    RBC 3.58 (*)    Hemoglobin 11.5 (*)    HCT 33.7 (*)    Platelets 108 (*)    All other components within normal limits  BASIC METABOLIC PANEL - Abnormal;  Notable for the following components:   Glucose, Bld 104 (*)    BUN 36 (*)    Creatinine, Ser 3.18 (*)    GFR, Estimated 20 (*)    All other components within normal limits  BRAIN NATRIURETIC PEPTIDE - Abnormal; Notable for the following components:  B Natriuretic Peptide 778.4 (*)    All other components within normal limits  CBG MONITORING, ED - Abnormal; Notable for the following components:   Glucose-Capillary 68 (*)    All other components within normal limits  RESP PANEL BY RT-PCR (FLU A&B, COVID) [id number]    EKG EKG Interpretation  Date/Time:  Monday July 15 2022 16:45:19 EDT Ventricular Rate:  73 PR Interval:  224 QRS Duration: 92 QT Interval:  438 QTC Calculation: 483 R Axis:   65 Text Interpretation: duplicate, discard Confirmed by Dorie Rank [phone]) on 07/15/2022 4:48:51 PM  Radiology DG Chest Portable 1 View  Result Date: 07/15/2022 CLINICAL DATA:  Cough and shortness of breath EXAM: PORTABLE CHEST 1 VIEW COMPARISON:  03/27/2022 FINDINGS: Prior CABG. Atherosclerotic calcification of the aortic arch. Upper normal heart size. The lungs appear clear.  No blunting of the costophrenic angles. IMPRESSION: 1. No active cardiopulmonary disease is radiographically apparent. 2. Prior CABG. 3.  Aortic Atherosclerosis (ICD10-I70.0). Electronically Signed   By: Van Clines M.D.   On: 07/15/2022 17:36    Procedures Procedures    Medications Ordered in ED Medications  albuterol (VENTOLIN HFA) 108 (90 Base) MCG/ACT inhaler 1-2 puff (has no administration in time range)  ipratropium-albuterol (DUONEB) 0.5-2.5 (3) MG/3ML nebulizer solution 3 mL (3 mLs Nebulization Given 07/15/22 1700)    ED Course/ Medical Decision Making/ A&P Clinical Course as of 07/15/22 1814  Mon Jul 15, 2022  1746 Resp Panel by RT-PCR (Flu A&B, Covid) Anterior Nasal Swab COVID and flu negative [JK]  3546 Basic metabolic panel(!) Creatinine elevated 3.18 but similar to previous values [JK]   1747 CBC(!) White blood cell count and hemoglobin decreased but not significantly changed compared to previous [JK]  1747 Brain natriuretic peptide(!) BNP elevated at 778 but decreased compared to previous [JK]  1747 DG Chest Portable 1 View Chest x-ray without acute findings [JK]    Clinical Course User Index [JK] Dorie Rank, MD                           Medical Decision Making Problems Addressed: Bronchitis: acute illness or injury that poses a threat to life or bodily functions Chronic congestive heart failure, unspecified heart failure type [facility]): chronic illness or injury Chronic kidney disease, unspecified CKD stage: chronic illness or injury  Amount and/or Complexity of Data Reviewed Labs: ordered. Decision-making details documented in ED Course. Radiology: ordered and independent interpretation performed. Decision-making details documented in ED Course.  Risk OTC drugs. Prescription drug management.   Patient presented to the ED with complaints of sore throat fever cough shortness of breath wheezing.  Chest x-ray without signs of pneumonia.  COVID flu test negative.  Patient does have an elevated BNP however it is decreased compared to previous values.  His weight today is unchanged compared to previous weights on file.  No signs of any significant fluid retention.  No pulm edema on x-ray.  I doubt an acute CHF exacerbation.  Patient symptoms sound mostly infectious in nature with his for throat nasal congestion.  He states he was coughing a lot at night and could not breathe through his nose.  He did have some wheezing on exam.  Considered prednisone but he states that really exacerbates his blood sugars and he does not have a history of COPD.  We will try a course of Tessalon and Mucinex to help with his cough and congestion.  We will also have him use  an albuterol inhaler.  Discussed outpatient follow-up with PCP   Discussed renal dosing his medications with pharmacy prior  to discharge     Final Clinical Impression(s) / ED Diagnoses Final diagnoses:  Bronchitis  Chronic kidney disease, unspecified CKD stage  Chronic congestive heart failure, unspecified heart failure type [facility])    Rx / DC Orders ED Discharge Orders          Ordered    Guaifenesin 1200 MG TB12  2 times daily        07/15/22 1811    benzonatate (TESSALON) 100 MG capsule  Every 8 hours        07/15/22 1811                Dorie Rank, MD 07/15/22 1814

## 2022-07-15 NOTE — ED Notes (Signed)
CBG 125 RN notified

## 2022-07-15 NOTE — ED Triage Notes (Signed)
Pt arrives pov, slow gait, c/o sore throat, cough, shob, wheezing x 2 weeks. Denies fever, endorses similar symptoms in June. Denies CP

## 2022-07-15 NOTE — ED Notes (Signed)
Patients CBG 125 RN notified

## 2022-07-16 ENCOUNTER — Ambulatory Visit: Payer: Medicare Other | Admitting: Podiatry

## 2022-07-18 ENCOUNTER — Ambulatory Visit: Payer: Medicare Other | Admitting: Podiatry

## 2022-07-22 DIAGNOSIS — N184 Chronic kidney disease, stage 4 (severe): Secondary | ICD-10-CM | POA: Diagnosis not present

## 2022-08-05 DIAGNOSIS — F411 Generalized anxiety disorder: Secondary | ICD-10-CM | POA: Diagnosis not present

## 2022-08-05 DIAGNOSIS — I130 Hypertensive heart and chronic kidney disease with heart failure and stage 1 through stage 4 chronic kidney disease, or unspecified chronic kidney disease: Secondary | ICD-10-CM | POA: Diagnosis not present

## 2022-08-05 DIAGNOSIS — I5030 Unspecified diastolic (congestive) heart failure: Secondary | ICD-10-CM | POA: Diagnosis not present

## 2022-08-05 DIAGNOSIS — I2510 Atherosclerotic heart disease of native coronary artery without angina pectoris: Secondary | ICD-10-CM | POA: Diagnosis not present

## 2022-08-05 DIAGNOSIS — G4700 Insomnia, unspecified: Secondary | ICD-10-CM | POA: Diagnosis not present

## 2022-08-05 DIAGNOSIS — M069 Rheumatoid arthritis, unspecified: Secondary | ICD-10-CM | POA: Diagnosis not present

## 2022-08-05 DIAGNOSIS — N184 Chronic kidney disease, stage 4 (severe): Secondary | ICD-10-CM | POA: Diagnosis not present

## 2022-08-05 DIAGNOSIS — C61 Malignant neoplasm of prostate: Secondary | ICD-10-CM | POA: Diagnosis not present

## 2022-08-05 DIAGNOSIS — E1122 Type 2 diabetes mellitus with diabetic chronic kidney disease: Secondary | ICD-10-CM | POA: Diagnosis not present

## 2022-08-05 DIAGNOSIS — Z23 Encounter for immunization: Secondary | ICD-10-CM | POA: Diagnosis not present

## 2022-08-05 DIAGNOSIS — Z0000 Encounter for general adult medical examination without abnormal findings: Secondary | ICD-10-CM | POA: Diagnosis not present

## 2022-08-05 DIAGNOSIS — G4733 Obstructive sleep apnea (adult) (pediatric): Secondary | ICD-10-CM | POA: Diagnosis not present

## 2022-08-09 ENCOUNTER — Encounter: Payer: Self-pay | Admitting: Podiatry

## 2022-08-09 ENCOUNTER — Ambulatory Visit (INDEPENDENT_AMBULATORY_CARE_PROVIDER_SITE_OTHER): Payer: Medicare Other | Admitting: Podiatry

## 2022-08-09 DIAGNOSIS — M79675 Pain in left toe(s): Secondary | ICD-10-CM

## 2022-08-09 DIAGNOSIS — M79674 Pain in right toe(s): Secondary | ICD-10-CM

## 2022-08-09 DIAGNOSIS — N1832 Chronic kidney disease, stage 3b: Secondary | ICD-10-CM | POA: Diagnosis not present

## 2022-08-09 DIAGNOSIS — B351 Tinea unguium: Secondary | ICD-10-CM | POA: Diagnosis not present

## 2022-08-09 DIAGNOSIS — E1122 Type 2 diabetes mellitus with diabetic chronic kidney disease: Secondary | ICD-10-CM

## 2022-08-09 DIAGNOSIS — Z794 Long term (current) use of insulin: Secondary | ICD-10-CM | POA: Diagnosis not present

## 2022-08-09 NOTE — Progress Notes (Signed)
  Subjective:  Patient ID: Alec [name], male    DOB: May 01, 1946,  MRN: [id number]  Chief Complaint  Patient presents with   Diabetes    Diabetic  Nail trim    76 y.o. male returns for the above complaint.  Patient presents with thickened elongated dystrophic toenails x10 mild pain on palpation patient is a diabetic with controlled A1c of 5.7.  He would like to debride the nails done has not evaluate himself.  Denies any other acute complaints.  Objective:  There were no vitals filed for this visit. Podiatric Exam: Vascular: dorsalis pedis and posterior tibial pulses are palpable bilateral. Capillary return is immediate. Temperature gradient is WNL. Skin turgor WNL  Sensorium: Normal Semmes Weinstein monofilament test. Normal tactile sensation bilaterally. Nail Exam: Pt has thick disfigured discolored nails with subungual debris noted bilateral entire nail hallux through fifth toenails.  Pain on palpation to the nails. Ulcer Exam: There is no evidence of ulcer or pre-ulcerative changes or infection. Orthopedic Exam: Muscle tone and strength are WNL. No limitations in general ROM. No crepitus or effusions noted.  Skin: No Porokeratosis. No infection or ulcers    Assessment & Plan:  No diagnosis found.  Patient was evaluated and treated and all questions answered.  Onychomycosis with pain  -Nails palliatively debrided as below. -Educated on self-care  Procedure: Nail Debridement Rationale: pain  Type of Debridement: manual, sharp debridement. Instrumentation: Nail nipper, rotary burr. Number of Nails: 10  Procedures and Treatment: Consent by patient was obtained for treatment procedures. The patient understood the discussion of treatment and procedures well. All questions were answered thoroughly reviewed. Debridement of mycotic and hypertrophic toenails, 1 through 5 bilateral and clearing of subungual debris. No ulceration, no infection noted.  Return Visit-Office Procedure:  Patient instructed to return to the office for a follow up visit 3 months for continued evaluation and treatment.  Boneta Lucks, DPM    No follow-ups on file.

## 2022-08-29 DIAGNOSIS — L821 Other seborrheic keratosis: Secondary | ICD-10-CM | POA: Diagnosis not present

## 2022-08-29 DIAGNOSIS — L298 Other pruritus: Secondary | ICD-10-CM | POA: Diagnosis not present

## 2022-08-29 DIAGNOSIS — D225 Melanocytic nevi of trunk: Secondary | ICD-10-CM | POA: Diagnosis not present

## 2022-08-29 DIAGNOSIS — C4441 Basal cell carcinoma of skin of scalp and neck: Secondary | ICD-10-CM | POA: Diagnosis not present

## 2022-08-29 DIAGNOSIS — L814 Other melanin hyperpigmentation: Secondary | ICD-10-CM | POA: Diagnosis not present

## 2022-08-29 DIAGNOSIS — L570 Actinic keratosis: Secondary | ICD-10-CM | POA: Diagnosis not present

## 2022-08-29 DIAGNOSIS — L300 Nummular dermatitis: Secondary | ICD-10-CM | POA: Diagnosis not present

## 2022-08-29 DIAGNOSIS — D485 Neoplasm of uncertain behavior of skin: Secondary | ICD-10-CM | POA: Diagnosis not present

## 2022-08-29 DIAGNOSIS — C44329 Squamous cell carcinoma of skin of other parts of face: Secondary | ICD-10-CM | POA: Diagnosis not present

## 2022-09-02 ENCOUNTER — Ambulatory Visit: Payer: Medicare Other | Admitting: Cardiology

## 2022-09-04 NOTE — Progress Notes (Unsigned)
Cardiology Office Note   Date:  09/05/2022   ID:  Alec [name], DOB [birth year], MRN [id number]  PCP:  Mayra Neer, MD  Cardiologist:   Minus Breeding, MD   Chief Complaint  Patient presents with   Coronary Artery Disease      History of Present Illness: Alec [name] is a 76 y.o. male who presents for follow up of CAD status post CABG.   He was admitted earlier this year with acute on chronic diastolic heart failure.  Since he was last seen he has done okay.  Has had no further shortness of breath, PND or orthopnea.  His weight is staying in the 1 89-1 92 range.  He is rarely having to take an extra Lasix.  He is watching his salt.  He is not exercising as much as I would like.  He denies any chest pressure, neck or arm discomfort.  He had no new edema.   Past Medical History:  Diagnosis Date   Anemia    Anxiety    Aortic insufficiency    Arthritis    Bilateral lower extremity edema    Chronic gout    06-19-2020  per pt last episode 6 months , great toe   CKD (chronic kidney disease), stage IV [facility])    Coronary artery disease cardiologist--- dr Carey Johndrow   CABG 2019   Degenerative arthritis of shoulder region 08/2013   left   History of bladder cancer 07/2008   s/p  TURBT   History of cellulitis 11/2017   left upper arm   History of kidney stones    Hyperlipidemia    Hypertension    followed by pcp/ cardiology   IDDM (insulin dependent diabetes mellitus)    endocrinologist--- dr Dwyane Dee---  pt uses insulin pump and dexcom  (06-19-2020 per pt fasting sugar-- 98--110)   Insulin pump in place    Mitral regurgitation    OSA (obstructive sleep apnea)    Prostate cancer [facility]) urologist--- dr herrick/  oncologist--- [place]   dx 11/ 2017  Gleason 3+3 active survillance;  bx 03-14-2020 Gleason 3+4   RA (rheumatoid arthritis) ([place])    rhemotologist--- dr Normajean Glasgow---     S/P CABG x 3 07/15/2018   LIMA to LAD;  SVG to PDA;  SVG to RI   Thrombocytopenia  [facility])     Past Surgical History:  Procedure Laterality Date   CHOLECYSTECTOMY  08/29/2012   Procedure: LAPAROSCOPIC CHOLECYSTECTOMY WITH INTRAOPERATIVE CHOLANGIOGRAM;  Surgeon: Imogene Burn. Tsuei, MD;  Location: [facility];  Service: General;  Laterality: N/A;   CORONARY ARTERY BYPASS GRAFT N/A 07/20/2018   Procedure: CORONARY ARTERY BYPASS GRAFTING (CABG) times three using left internal mammary artery to the LAD, and using endoscopically harvested right saphenous vein to PDA and intermedius.;  Surgeon: Grace Isaac, MD;  Location: [facility];  Service: Open Heart Surgery;  Laterality: N/A;   LEFT HEART CATH AND CORONARY ANGIOGRAPHY N/A 07/15/2018   Procedure: LEFT HEART CATH AND CORONARY ANGIOGRAPHY;  Surgeon: Leonie Man, MD;  Location: [facility];  Service: Cardiovascular;  Laterality: N/A;   RADIOACTIVE SEED IMPLANT N/A 06/21/2020   Procedure: RADIOACTIVE SEED IMPLANT/BRACHYTHERAPY IMPLANT;  Surgeon: Ardis Hughs, MD;  Location: [facility];  Service: Urology;  Laterality: N/A;   SHOULDER ARTHROSCOPY W/ ROTATOR CUFF REPAIR Right 09/03/2013   SHOULDER ARTHROSCOPY WITH SUBACROMIAL DECOMPRESSION, ROTATOR CUFF REPAIR AND BICEP TENDON REPAIR Left 09/03/2013   Procedure: LEFT SHOULDER  ARTHROSCOPY WITH EXTENSIVE DEBRIDMENT, DISTAL CLAVICULECTOMY, ROTATOR CUFF REPAIR AND SUBACROMIAL DECOMPRESSION PARTIAL ACRIOMIOPLASTY WITH CORACOACROMIAL RELEASE;  Surgeon: Renette Butters, MD;  Location: [facility];  Service: Orthopedics;  Laterality: Left;   SPACE OAR INSTILLATION N/A 06/21/2020   Procedure: SPACE OAR INSTILLATION;  Surgeon: Ardis Hughs, MD;  Location: [facility];  Service: Urology;  Laterality: N/A;   TEE WITHOUT CARDIOVERSION N/A 07/20/2018   Procedure: TRANSESOPHAGEAL ECHOCARDIOGRAM (TEE);  Surgeon: Grace Isaac, MD;  Location: [place] [facility];  Service: Open Heart Surgery;  Laterality: N/A;   TRANSURETHRAL RESECTION OF BLADDER TUMOR  WITH MITOMYCIN-C  08/24/2008   _0      Current Outpatient Medications  Medication Sig Dispense Refill   allopurinol (ZYLOPRIM) 100 MG tablet Take 200 mg by mouth daily.      aspirin EC 81 MG tablet Take 81 mg by mouth daily.     atorvastatin (LIPITOR) 40 MG tablet Take 1 tablet (40 mg total) by mouth daily. 90 tablet 3   benzonatate (TESSALON) 100 MG capsule Take 1 capsule (100 mg total) by mouth every 8 (eight) hours. 21 capsule 0   Blood Glucose Monitoring Suppl (FREESTYLE FREEDOM LITE) w/Device KIT Use to check blood sugar three times daily. DX:E11.65 1 kit 0   Boswellia-Glucosamine-Vit D (OSTEO BI-FLEX ONE PER DAY PO) Take 1 tablet by mouth daily.     Cholecalciferol (VITAMIN D3 PO) Take 1 capsule by mouth daily.     CINNAMON PO Take 500 mg by mouth daily.     cloNIDine (CATAPRES) 0.1 MG tablet Take 1 tablet (0.1 mg total) by mouth 2 (two) times daily. 60 tablet 11   hydrALAZINE (APRESOLINE) 100 MG tablet Take 1 tablet (100 mg total) by mouth 3 (three) times daily. 90 tablet 2   insulin aspart (NOVOLOG) 100 UNIT/ML injection Use max of 85 units daily via insulin pump 80 mL 2   isosorbide mononitrate (IMDUR) 120 MG 24 hr tablet Take 1 tablet (120 mg total) by mouth daily. 90 tablet 3   latanoprost (XALATAN) 0.005 % ophthalmic solution Place 1 drop into both eyes at bedtime.     metoprolol tartrate (LOPRESSOR) 50 MG tablet Take 1 tablet (50 mg total) by mouth 2 (two) times daily. 180 tablet 3   Misc Natural Products (TART CHERRY ADVANCED PO) Take 1 tablet by mouth daily.     Misc Natural Products (TART CHERRY ADVANCED) CAPS Take 1,000 mg by mouth daily in the afternoon.     Multiple Vitamins-Minerals (CENTRUM SILVER PO) Take 1 tablet by mouth daily.     nitroGLYCERIN (NITROSTAT) 0.4 MG SL tablet Place 1 tablet (0.4 mg total) under the tongue every 5 (five) minutes as needed for chest pain. 30 tablet 1   olmesartan (BENICAR) 5 MG tablet Take 5 mg by mouth daily.     sertraline (ZOLOFT)  100 MG tablet Take 1 tablet (100 mg total) by mouth daily. 90 tablet 3   tamsulosin (FLOMAX) 0.4 MG CAPS capsule Take 0.4 mg by mouth at bedtime.     furosemide (LASIX) 20 MG tablet Take 1 tablet (20 mg total) by mouth daily. 90 tablet 3   No current facility-administered medications for this visit.    Allergies:   Nsaids    ROS:  Please see the history of present illness.   Otherwise, review of systems are positive for none.   All other systems are reviewed and negative.    PHYSICAL EXAM: VS:  BP 120/60  Pulse (!) 55   Ht _0  (1.676 m)   Wt 194 lb 3.2 oz (88.1 kg)   SpO2 95%   BMI 31.34 kg/m  , BMI Body mass index is 31.34 kg/m. GENERAL:  Well appearing NECK:  No jugular venous distention, waveform within normal limits, carotid upstroke brisk and symmetric, no bruits, no thyromegaly LUNGS:  Clear to auscultation bilaterally CHEST:  Unremarkable HEART:  PMI not displaced or sustained,S1 and S2 within normal limits, no S3, no S4, no clicks, no rubs, no murmurs ABD:  Flat, positive bowel sounds normal in frequency in pitch, no bruits, no rebound, no guarding, no midline pulsatile mass, no hepatomegaly, no splenomegaly EXT:  2 plus pulses throughout, no edema, no cyanosis no clubbing  EKG:  EKG is not ordered today. NA    Recent Labs: 03/29/2022: TSH 2.735 07/15/2022: B Natriuretic Peptide 778.4; BUN 36; Creatinine, Ser 3.18; Hemoglobin 11.5; Platelets 108; Potassium 3.6; Sodium 139    Lipid Panel    Component Value Date/Time   CHOL 102 03/29/2022 0217   CHOL 116 11/03/2020 0908   TRIG 107 03/29/2022 0217   HDL 24 (L) 03/29/2022 0217   HDL 33 (L) 11/03/2020 0908   CHOLHDL 4.3 03/29/2022 0217   VLDL 21 03/29/2022 0217   LDLCALC 57 03/29/2022 0217   LDLCALC 63 11/03/2020 0908      Wt Readings from Last 3 Encounters:  09/05/22 194 lb 3.2 oz (88.1 kg)  07/15/22 201 lb 14.4 oz (91.6 kg)  06/18/22 194 lb 3.2 oz (88.1 kg)      Other studies Reviewed: Additional  studies/ records that were reviewed today include: Labs Review of the above records demonstrates:  Please see elsewhere in the note.     ASSESSMENT AND PLAN:   CHRONIC DIASTOLIC HF: Seems to be euvolemic.  No change in therapy.  I reinforced as needed Lasix dosing.  BRADYCARDIA: He had no further symptomatic bradycardia arrhythmias.  No change in therapy.  CAD:   The patient has no new sypmtoms.  No further cardiovascular testing is indicated.  We will continue with aggressive risk reduction and meds as listed.  HTN:  The blood pressure is at target.  No change in therapy.  HYPERLIPIDEMIA:   LDL was 57.  HDL 24.  No change in therapy.   DM:   A1C was 6.3.  No change in therapy.  He says it is actually down to 5 but I do not see this.   CKD IV:   Creat is up creatinine is 3.04 when I see it last he is followed closely by nephrology.  Current medicines are reviewed at length with the patient today.  The patient does not have concerns regarding medicines. Good The following changes have been made: None  Labs/ tests ordered today include:   None  No orders of the defined types were placed in this encounter.    Disposition:   FU with me in 12 months  Signed, Minus Breeding, MD  09/05/2022 9:29 AM    [facility]

## 2022-09-05 ENCOUNTER — Encounter: Payer: Self-pay | Admitting: Cardiology

## 2022-09-05 ENCOUNTER — Ambulatory Visit: Payer: Medicare Other | Attending: Cardiology | Admitting: Cardiology

## 2022-09-05 VITALS — BP 120/60 | HR 55 | Ht 66.0 in | Wt 194.2 lb

## 2022-09-05 DIAGNOSIS — I2510 Atherosclerotic heart disease of native coronary artery without angina pectoris: Secondary | ICD-10-CM | POA: Diagnosis not present

## 2022-09-05 DIAGNOSIS — I10 Essential (primary) hypertension: Secondary | ICD-10-CM | POA: Insufficient documentation

## 2022-09-05 DIAGNOSIS — E785 Hyperlipidemia, unspecified: Secondary | ICD-10-CM | POA: Diagnosis not present

## 2022-09-05 DIAGNOSIS — R001 Bradycardia, unspecified: Secondary | ICD-10-CM | POA: Diagnosis not present

## 2022-09-05 DIAGNOSIS — N184 Chronic kidney disease, stage 4 (severe): Secondary | ICD-10-CM | POA: Insufficient documentation

## 2022-09-05 DIAGNOSIS — E118 Type 2 diabetes mellitus with unspecified complications: Secondary | ICD-10-CM | POA: Diagnosis not present

## 2022-09-05 DIAGNOSIS — I5032 Chronic diastolic (congestive) heart failure: Secondary | ICD-10-CM | POA: Diagnosis not present

## 2022-09-05 MED ORDER — FUROSEMIDE 20 MG PO TABS: 20.0000 mg | ORAL_TABLET | Freq: Every day | ORAL | 3 refills | Status: DC

## 2022-09-05 NOTE — Patient Instructions (Signed)
Medication Instructions:  Your physician recommends that you continue on your current medications as directed. Please refer to the Current Medication list given to you today.  *If you need a refill on your cardiac medications before your next appointment, please call your pharmacy*   Lab Work: NONE If you have labs (blood work) drawn today and your tests are completely normal, you will receive your results only by: MyChart Message (if you have MyChart) OR A paper copy in the mail If you have any lab test that is abnormal or we need to change your treatment, we will call you to review the results.   Testing/Procedures: NONE   Follow-Up: At [facility], you and your health needs are our priority.  As part of our continuing mission to provide you with exceptional heart care, we have created designated Provider Care Teams.  These Care Teams include your primary Cardiologist (physician) and Advanced Practice Providers (APPs -  Physician Assistants and Nurse Practitioners) who all work together to provide you with the care you need, when you need it.  We recommend signing up for the patient portal called "MyChart".  Sign up information is provided on this After Visit Summary.  MyChart is used to connect with patients for Virtual Visits (Telemedicine).  Patients are able to view lab/test results, encounter notes, upcoming appointments, etc.  Non-urgent messages can be sent to your provider as well.   To learn more about what you can do with MyChart, go to [email or web address].    Your next appointment:   1 year(s)  The format for your next appointment:   In Person  Provider:   James Hochrein, MD    

## 2022-09-06 DIAGNOSIS — N184 Chronic kidney disease, stage 4 (severe): Secondary | ICD-10-CM | POA: Diagnosis not present

## 2022-09-11 DIAGNOSIS — I129 Hypertensive chronic kidney disease with stage 1 through stage 4 chronic kidney disease, or unspecified chronic kidney disease: Secondary | ICD-10-CM | POA: Diagnosis not present

## 2022-09-11 DIAGNOSIS — N184 Chronic kidney disease, stage 4 (severe): Secondary | ICD-10-CM | POA: Diagnosis not present

## 2022-09-11 DIAGNOSIS — N2581 Secondary hyperparathyroidism of renal origin: Secondary | ICD-10-CM | POA: Diagnosis not present

## 2022-09-11 DIAGNOSIS — D631 Anemia in chronic kidney disease: Secondary | ICD-10-CM | POA: Diagnosis not present

## 2022-10-04 ENCOUNTER — Other Ambulatory Visit (INDEPENDENT_AMBULATORY_CARE_PROVIDER_SITE_OTHER): Payer: Medicare Other

## 2022-10-04 DIAGNOSIS — E1165 Type 2 diabetes mellitus with hyperglycemia: Secondary | ICD-10-CM

## 2022-10-04 DIAGNOSIS — Z794 Long term (current) use of insulin: Secondary | ICD-10-CM

## 2022-10-04 LAB — BASIC METABOLIC PANEL
BUN: 46 mg/dL — ABNORMAL HIGH (ref 6–23)
CO2: 26 mEq/L (ref 19–32)
Calcium: 9.2 mg/dL (ref 8.4–10.5)
Chloride: 108 mEq/L (ref 96–112)
Creatinine, Ser: 3.27 mg/dL — ABNORMAL HIGH (ref 0.40–1.50)
GFR: 17.93 mL/min — ABNORMAL LOW (ref >60.00)
Glucose, Bld: 155 mg/dL — ABNORMAL HIGH (ref 70–99)
Potassium: 4.3 mEq/L (ref 3.5–5.1)
Sodium: 141 mEq/L (ref 135–145)

## 2022-10-04 LAB — HEMOGLOBIN A1C: Hgb A1c MFr Bld: 6.2 % (ref 4.6–6.5)

## 2022-10-08 ENCOUNTER — Other Ambulatory Visit: Payer: Medicare Other

## 2022-10-10 ENCOUNTER — Emergency Department (HOSPITAL_BASED_OUTPATIENT_CLINIC_OR_DEPARTMENT_OTHER)
Admission: EM | Admit: 2022-10-10 | Discharge: 2022-10-10 | Disposition: A | Discharge status: Home / Self Care | Payer: Medicare Other | Attending: Emergency Medicine | Admitting: Emergency Medicine

## 2022-10-10 ENCOUNTER — Encounter (HOSPITAL_BASED_OUTPATIENT_CLINIC_OR_DEPARTMENT_OTHER): Payer: Self-pay | Admitting: Pediatrics

## 2022-10-10 ENCOUNTER — Telehealth: Payer: Self-pay

## 2022-10-10 ENCOUNTER — Ambulatory Visit (INDEPENDENT_AMBULATORY_CARE_PROVIDER_SITE_OTHER): Payer: Medicare Other | Admitting: Endocrinology

## 2022-10-10 ENCOUNTER — Encounter: Payer: Self-pay | Admitting: Endocrinology

## 2022-10-10 ENCOUNTER — Other Ambulatory Visit: Payer: Self-pay

## 2022-10-10 VITALS — BP 142/66 | HR 59 | Ht 66.0 in | Wt 199.2 lb

## 2022-10-10 DIAGNOSIS — E1121 Type 2 diabetes mellitus with diabetic nephropathy: Secondary | ICD-10-CM

## 2022-10-10 DIAGNOSIS — Z951 Presence of aortocoronary bypass graft: Secondary | ICD-10-CM | POA: Insufficient documentation

## 2022-10-10 DIAGNOSIS — Z794 Long term (current) use of insulin: Secondary | ICD-10-CM | POA: Insufficient documentation

## 2022-10-10 DIAGNOSIS — E1165 Type 2 diabetes mellitus with hyperglycemia: Secondary | ICD-10-CM | POA: Diagnosis not present

## 2022-10-10 DIAGNOSIS — Z7982 Long term (current) use of aspirin: Secondary | ICD-10-CM | POA: Diagnosis not present

## 2022-10-10 DIAGNOSIS — Z79899 Other long term (current) drug therapy: Secondary | ICD-10-CM | POA: Insufficient documentation

## 2022-10-10 DIAGNOSIS — E119 Type 2 diabetes mellitus without complications: Secondary | ICD-10-CM | POA: Diagnosis not present

## 2022-10-10 DIAGNOSIS — I10 Essential (primary) hypertension: Secondary | ICD-10-CM | POA: Diagnosis not present

## 2022-10-10 DIAGNOSIS — H25811 Combined forms of age-related cataract, right eye: Secondary | ICD-10-CM | POA: Diagnosis not present

## 2022-10-10 DIAGNOSIS — I160 Hypertensive urgency: Secondary | ICD-10-CM | POA: Insufficient documentation

## 2022-10-10 DIAGNOSIS — H401131 Primary open-angle glaucoma, bilateral, mild stage: Secondary | ICD-10-CM | POA: Diagnosis not present

## 2022-10-10 DIAGNOSIS — H25812 Combined forms of age-related cataract, left eye: Secondary | ICD-10-CM | POA: Diagnosis not present

## 2022-10-10 LAB — CBC WITH DIFFERENTIAL/PLATELET
Abs Immature Granulocytes: 0.02 10*3/uL (ref 0.00–0.07)
Basophils Absolute: 0 10*3/uL (ref 0.0–0.1)
Basophils Relative: 1 %
Eosinophils Absolute: 0 10*3/uL (ref 0.0–0.5)
Eosinophils Relative: 1 %
HCT: 37 % — ABNORMAL LOW (ref 39.0–52.0)
Hemoglobin: 12.3 g/dL — ABNORMAL LOW (ref 13.0–17.0)
Immature Granulocytes: 0 %
Lymphocytes Relative: 23 %
Lymphs Abs: 1.1 10*3/uL (ref 0.7–4.0)
MCH: 32.1 pg (ref 26.0–34.0)
MCHC: 33.2 g/dL (ref 30.0–36.0)
MCV: 96.6 fL (ref 80.0–100.0)
Monocytes Absolute: 0.4 10*3/uL (ref 0.1–1.0)
Monocytes Relative: 8 %
Neutro Abs: 3.3 10*3/uL (ref 1.7–7.7)
Neutrophils Relative %: 67 %
Platelets: 117 10*3/uL — ABNORMAL LOW (ref 150–400)
RBC: 3.83 MIL/uL — ABNORMAL LOW (ref 4.22–5.81)
RDW: 14.5 % (ref 11.5–15.5)
WBC: 4.9 10*3/uL (ref 4.0–10.5)
nRBC: 0 % (ref 0.0–0.2)

## 2022-10-10 LAB — COMPREHENSIVE METABOLIC PANEL
ALT: 29 U/L (ref 0–44)
AST: 26 U/L (ref 15–41)
Albumin: 3.7 g/dL (ref 3.5–5.0)
Alkaline Phosphatase: 67 U/L (ref 38–126)
Anion gap: 9 (ref 5–15)
BUN: 39 mg/dL — ABNORMAL HIGH (ref 8–23)
CO2: 23 mmol/L (ref 22–32)
Calcium: 9 mg/dL (ref 8.9–10.3)
Chloride: 108 mmol/L (ref 98–111)
Creatinine, Ser: 2.74 mg/dL — ABNORMAL HIGH (ref 0.61–1.24)
GFR, Estimated: 24 mL/min — ABNORMAL LOW (ref >60)
Glucose, Bld: 106 mg/dL — ABNORMAL HIGH (ref 70–99)
Potassium: 3.9 mmol/L (ref 3.5–5.1)
Sodium: 140 mmol/L (ref 135–145)
Total Bilirubin: 0.8 mg/dL (ref 0.3–1.2)
Total Protein: 7 g/dL (ref 6.5–8.1)

## 2022-10-10 LAB — TROPONIN I (HIGH SENSITIVITY)
Troponin I (High Sensitivity): 32 ng/L — ABNORMAL HIGH (ref <18)
Troponin I (High Sensitivity): 36 ng/L — ABNORMAL HIGH (ref <18)

## 2022-10-10 MED ORDER — METOPROLOL TARTRATE 25 MG PO TABS
50.0000 mg | ORAL_TABLET | Freq: Once | ORAL | Status: AC
  Administered 2022-10-10: 50 mg via ORAL
  Filled 2022-10-10: qty 2

## 2022-10-10 MED ORDER — HYDRALAZINE HCL 25 MG PO TABS
100.0000 mg | ORAL_TABLET | Freq: Once | ORAL | Status: AC
  Administered 2022-10-10: 100 mg via ORAL
  Filled 2022-10-10: qty 4

## 2022-10-10 NOTE — Telephone Encounter (Signed)
See other note

## 2022-10-10 NOTE — Patient Instructions (Signed)
Bolus before each meal regardless of sugar

## 2022-10-10 NOTE — Discharge Instructions (Signed)
Contact a health care provider if you: Think you are having a reaction to a medicine you are taking. Have headaches that keep coming back (recurring). Feel dizzy. Have swelling in your ankles. Have trouble with your vision. Get help right away if you: Develop a severe headache or confusion. Have unusual weakness or numbness. Feel faint. Have severe pain in your chest or abdomen. Vomit repeatedly. Have trouble breathing. These symptoms may be an emergency. Get help right away. Call 911. Do not wait to see if the symptoms will go away. Do not drive yourself to the hospital.

## 2022-10-10 NOTE — ED Notes (Addendum)
Troponin drawn & sent

## 2022-10-10 NOTE — Telephone Encounter (Signed)
Called to the front office because patient wanted to be seen. Spoke with patient who was accompanied by his wife. Patient stated he was at ophthalmologist's office and had an elevated SBP of 180 or SBP 200. He stated his eye doctor suggested he go to the ED or see his cardiologist. Explained to patient that our office is not a walk-in clinic, and that it is best if he goes to the ED. Offered to call 911 for him, but he refused. Wife then asked why we could not check his BP. Explained that if his BP is elevated, we are not a clinic where we can treat him, and that the ED has the capability to treat his BP. They walked out of the front office.

## 2022-10-10 NOTE — Progress Notes (Signed)
Patient ID: Alec [name], male   DOB: May 04, 1946, 77 y.o.   MRN: [id number]             Reason for Appointment:  Follow-up for Type 2 Diabetes    History of Present Illness:          Diagnosis: Type 2 diabetes mellitus, date of diagnosis: 2001       Prior history: He was seen in consultation in 11/2014 when his A1c was 7.4 Since his blood sugars were averaging over 200 he was given Victoza in addition to his basal bolus insulin regimen in 6/16 He started on the V go pump in October 2016  Recent history:    INSULIN regimen  on T-Slim pump:    BASAL rate settings:  Midnight = 2.0, 3 AM = 1.8, 8 AM = 2.4, 4 PM = 2.7, 7 PM = 3.0 and 10 PM = 2.2  Boluses 3-9 units before meals  Non-insulin hypoglycemic drugs the patient is taking are: None   His A1c is quite stable at 6.2   Current blood sugar patterns from analysis of his Dexcom sensor download, daily management and problems identified:   Overall blood sugars are surprisingly well-controlled even with his bolusing irregularly and also being in the sleep mode all the time  He is not clear why he entered the sleep mode for all 24 hours He is not likely aware that he can bolus to cover his intake even when the blood sugars are normal Premeal and sometimes a bolus when the blood sugar goes up Also occasionally has no boluses  With this his daily insulin is only 15% in bolus amounts Because of being in sleep mode he has no control IQ auto bolus  He feels a little hypoglycemic sometimes before lunch and dinner but overall hypoglycemia has been infrequent and mild  Overnight blood sugars are not quite as low with reducing 3 AM basal on the last visit Overall has gained a little weight since his last visit Is mostly active when he is working 3 days a week but also does some yard work on the other days Has difficulty affording GLP-1 drugs    Glucose monitoring:  Dexcom  Summary of patterns: On average blood sugars are within  the target range at all times Blood sugars are generally the lowest early morning and on the higher side of the range after lunch and dinner OVERNIGHT blood sugars are variable in the first part of the night but generally trending in 2 averaging about 100 or less by 4-5 AM  Minimal hypoglycemia overnight His postprandial readings are rising variably and periodically in the low 200 range at all meals with most of the rise being after lunch Minimal hypoglycemia around 12-1 PM or 4-5.      Statistics:    CGM use % of time   2-week average/SD 134/40  Time in range 84      %  % Time Above 180 14  % Time above 250   % Time Below 70 2       PRE-MEAL  overnight  mornings  afternoon  evening Overall  Glucose range:       Averages: 120 128 143 146    PREVIOUS:   CGM use % of time   2-week average/SD 149  Time in range        71%  % Time Above 180 28  % Time above 250   % Time Below 70 0  PRE-MEAL  overnight  mornings  afternoon  evening Overall  Glucose range:       Averages:  124 144 159 173      Self-care:  Meals: 3 meals per day. Lunch  1 pm Dinner 6 pm Breakfast is usually an egg sandwich but sometimes biscuits   Eating balanced meals in the evening.  He will have snacks with granola bars              Dietician visit, most recent: 06/2020.               Weight history:  Wt Readings from Last 3 Encounters:  10/10/22 199 lb 3.2 oz (90.4 kg)  09/05/22 194 lb 3.2 oz (88.1 kg)  07/15/22 201 lb 14.4 oz (91.6 kg)    Glycemic control:   Lab Results  Component Value Date   HGBA1C 6.2 10/04/2022   HGBA1C 6.3 06/12/2022   HGBA1C 5.6 01/25/2022   Lab Results  Component Value Date   MICROALBUR 397.0 (H) 05/25/2021   LDLCALC 57 03/29/2022   CREATININE 3.27 (H) 10/04/2022   Lab Results  Component Value Date   HGB 11.5 (L) 07/15/2022     Past history:  He was not symptomatic at time of diagnosis and was probably treated with Amaryl only.  No details are  available of previous management However he thinks he was not given metformin at any time because of his "kidney problem" He had a A1c of over 9% in 2013 and may have been started on insulin at that time. Most likely has been taking Levemir and NovoLog since then A1c records show his results were over 8% in 2014 and in 4/15 but subsequently down to 7.1 in November 2015 Amaryl was stopped in 7/16  Other active problems are addressed in Review of systems   Allergies as of 10/10/2022       Reactions   Nsaids Other (See Comments)   Told to avoid by dr         Medication List        Accurate as of October 10, 2022  9:29 AM. If you have any questions, ask your nurse or doctor.          allopurinol 100 MG tablet Commonly known as: ZYLOPRIM Take 200 mg by mouth daily.   aspirin EC 81 MG tablet Take 81 mg by mouth daily.   atorvastatin 40 MG tablet Commonly known as: LIPITOR Take 1 tablet (40 mg total) by mouth daily.   benzonatate 100 MG capsule Commonly known as: TESSALON Take 1 capsule (100 mg total) by mouth every 8 (eight) hours.   CENTRUM SILVER PO Take 1 tablet by mouth daily.   CINNAMON PO Take 500 mg by mouth daily.   cloNIDine 0.1 MG tablet Commonly known as: CATAPRES Take 1 tablet (0.1 mg total) by mouth 2 (two) times daily.   FreeStyle Freedom Lite w/Device Kit Use to check blood sugar three times daily. DX:E11.65   furosemide 20 MG tablet Commonly known as: LASIX Take 1 tablet (20 mg total) by mouth daily.   hydrALAZINE 100 MG tablet Commonly known as: APRESOLINE Take 1 tablet (100 mg total) by mouth 3 (three) times daily.   insulin aspart 100 UNIT/ML injection Commonly known as: novoLOG Use max of 85 units daily via insulin pump   isosorbide mononitrate 120 MG 24 hr tablet Commonly known as: IMDUR Take 1 tablet (120 mg total) by mouth daily.   latanoprost 0.005 % ophthalmic  solution Commonly known as: XALATAN Place 1 drop into both eyes  at bedtime.   metoprolol tartrate 50 MG tablet Commonly known as: LOPRESSOR Take 1 tablet (50 mg total) by mouth 2 (two) times daily.   nitroGLYCERIN 0.4 MG SL tablet Commonly known as: NITROSTAT Place 1 tablet (0.4 mg total) under the tongue every 5 (five) minutes as needed for chest pain.   olmesartan 5 MG tablet Commonly known as: BENICAR Take 5 mg by mouth daily.   OSTEO BI-FLEX ONE PER DAY PO Take 1 tablet by mouth daily.   sertraline 100 MG tablet Commonly known as: ZOLOFT Take 1 tablet (100 mg total) by mouth daily.   tamsulosin 0.4 MG Caps capsule Commonly known as: FLOMAX Take 0.4 mg by mouth at bedtime.   TART CHERRY ADVANCED PO Take 1 tablet by mouth daily.   Tart Cherry Advanced Caps Take 1,000 mg by mouth daily in the afternoon.   VITAMIN D3 PO Take 1 capsule by mouth daily.        Allergies:  Allergies  Allergen Reactions   Nsaids Other (See Comments)    Told to avoid by dr     Past Medical History:  Diagnosis Date   Anemia    Anxiety    Aortic insufficiency    Arthritis    Bilateral lower extremity edema    Chronic gout    06-19-2020  per pt last episode 6 months , great toe   CKD (chronic kidney disease), stage IV [facility])    Coronary artery disease cardiologist--- dr hochrein   CABG 2019   Degenerative arthritis of shoulder region 08/2013   left   History of bladder cancer 07/2008   s/p  TURBT   History of cellulitis 11/2017   left upper arm   History of kidney stones    Hyperlipidemia    Hypertension    followed by pcp/ cardiology   IDDM (insulin dependent diabetes mellitus)    endocrinologist--- dr Dwyane Dee---  pt uses insulin pump and dexcom  (06-19-2020 per pt fasting sugar-- 98--110)   Insulin pump in place    Mitral regurgitation    OSA (obstructive sleep apnea)    Prostate cancer [facility]) urologist--- dr herrick/  oncologist--- [place]   dx 11/ 2017  Gleason 3+3 active survillance;  bx 03-14-2020 Gleason 3+4   RA (rheumatoid  arthritis) ([place] [facility])    rhemotologist--- dr Normajean Glasgow---     S/P CABG x 3 07/15/2018   LIMA to LAD;  SVG to PDA;  SVG to RI   Thrombocytopenia [facility])     Past Surgical History:  Procedure Laterality Date   CHOLECYSTECTOMY  08/29/2012   Procedure: LAPAROSCOPIC CHOLECYSTECTOMY WITH INTRAOPERATIVE CHOLANGIOGRAM;  Surgeon: Imogene Burn. Tsuei, MD;  Location: [facility];  Service: General;  Laterality: N/A;   CORONARY ARTERY BYPASS GRAFT N/A 07/20/2018   Procedure: CORONARY ARTERY BYPASS GRAFTING (CABG) times three using left internal mammary artery to the LAD, and using endoscopically harvested right saphenous vein to PDA and intermedius.;  Surgeon: Grace Isaac, MD;  Location: [place];  Service: Open Heart Surgery;  Laterality: N/A;   LEFT HEART CATH AND CORONARY ANGIOGRAPHY N/A 07/15/2018   Procedure: LEFT HEART CATH AND CORONARY ANGIOGRAPHY;  Surgeon: Leonie Man, MD;  Location: [place] [facility];  Service: Cardiovascular;  Laterality: N/A;   RADIOACTIVE SEED IMPLANT N/A 06/21/2020   Procedure: RADIOACTIVE SEED IMPLANT/BRACHYTHERAPY IMPLANT;  Surgeon: Ardis Hughs, MD;  Location[facility];  Service: Urology;  Laterality: N/A;   SHOULDER ARTHROSCOPY W/ ROTATOR CUFF REPAIR Right 09/03/2013   SHOULDER ARTHROSCOPY WITH SUBACROMIAL DECOMPRESSION, ROTATOR CUFF REPAIR AND BICEP TENDON REPAIR Left 09/03/2013   Procedure: LEFT SHOULDER ARTHROSCOPY WITH EXTENSIVE DEBRIDMENT, DISTAL CLAVICULECTOMY, ROTATOR CUFF REPAIR AND SUBACROMIAL DECOMPRESSION PARTIAL ACRIOMIOPLASTY WITH CORACOACROMIAL RELEASE;  Surgeon: Renette Butters, MD;  Location: [place];  Service: Orthopedics;  Laterality: Left;   SPACE OAR INSTILLATION N/A 06/21/2020   Procedure: SPACE OAR INSTILLATION;  Surgeon: Ardis Hughs, MD;  Location: [facility];  Service: Urology;  Laterality: N/A;   TEE WITHOUT CARDIOVERSION N/A 07/20/2018   Procedure: TRANSESOPHAGEAL ECHOCARDIOGRAM  (TEE);  Surgeon: Grace Isaac, MD;  Location: [place];  Service: Open Heart Surgery;  Laterality: N/A;   TRANSURETHRAL RESECTION OF BLADDER TUMOR WITH MITOMYCIN-C  08/24/2008   '@[facility]'$     Family History  Problem Relation Age of Onset   Diabetes Mother    Lung cancer Mother    Cancer Father    Coronary artery disease Father        MI at age 1.    Lung cancer Father    Sudden death Son    CAD Maternal Grandfather        MI in his 64's   CAD Paternal Grandfather    Breast cancer Neg Hx    Colon cancer Neg Hx    Pancreatic cancer Neg Hx     Social History:  reports that he quit smoking about 25 years ago. His smoking use included cigarettes. He has a 50.00 pack-year smoking history. He has never used smokeless tobacco. He reports that he does not currently use alcohol. He reports that he does not use drugs.    Review of Systems      HYPERTENSION: Blood pressure followed by cardiologist and nephrologist He also checks at home  He is not on ACE or ARB drugs  BP Readings from Last 3 Encounters:  10/10/22 (!) 142/66  09/05/22 120/60  07/15/22 (!) 182/65           Lipids: he is taking Lipitor 40 mg from his PCP Lipids are controlled except for low HDL      Lab Results  Component Value Date   CHOL 102 03/29/2022   HDL 24 (L) 03/29/2022   LDLCALC 57 03/29/2022   TRIG 107 03/29/2022   CHOLHDL 4.3 03/29/2022               CKD: He has been followed by nephrologist for diabetic nephropathy.   Last urine microalbumin ratio is about 200 He is not on an ACE inhibitor/ARB drug  His creatinine is not significantly higher   Lab Results  Component Value Date   CREATININE 3.27 (H) 10/04/2022   CREATININE 3.18 (H) 07/15/2022   CREATININE 3.73 (H) 06/12/2022   Lab Results  Component Value Date   K 4.3 10/04/2022    Last foot exam in 12/22  Has neuropathic symptoms controlled with low-dose gabapentin, also on Requip from PCP   Eye exam last:  6/22, report not  available   Physical Examination:  BP (!) 142/66   Pulse (!) 59   Ht '5\' 6"'$  (1.676 m)   Wt 199 lb 3.2 oz (90.4 kg)   SpO2 96%   BMI 32.15 kg/m        ASSESSMENT:  Diabetes type 2, insulin requiring  See history of present illness for detailed discussion of his current management, blood sugar patterns and from his  Dexcom data and problems identified:  He is using the T-insulin pump  His A1c is 6.3 and stable  His pump download was reviewed in detail Periods of hyperglycemia and hypoglycemia were identified and causes reviewed with patient Discussed need for bolusing consistently with all meals and also before starting to eat regardless of Premeal blood sugar Discussed the factor of reverse correction Also discussed that he should not be adjusting his activity mode and getting himself in the sleep mode all the time Currently only 15% of his insulin is in the form of boluses However surprisingly he is still 84% within the target range  LIPIDS: Followed by PCP  CKD: GFR is about 18 and he was told by his nephrologist that the threshold for considering dialysis is below 18    PLAN: Basal rate will be reduced at 8 AM down to 2.25 to avoid tendency to low sugars before lunch and dinner This was done by myself Sleep mode was taken off Discussed bolusing for meals and snacks as above AM down to 1.8 and this was done in the office under supervision Discussed the need to avoid hyperglycemia after meals and that he should bolus before starting to eat regardless of Premeal blood sugar Also bolus for all snacks  Take correction boluses manually if blood glucose continues to be high after meals or otherwise persistently high without putting in extra carbs  Follow-up in 4 months   Patient Instructions  Bolus before each meal regardless of sugar   Total visit time including counseling = 30 minutes   Elayne Snare 10/10/2022, 9:29 AM   Note: This office note was prepared with  Dragon voice recognition system technology. Any transcriptional errors that result from this process are unintentional.

## 2022-10-10 NOTE — ED Triage Notes (Signed)
Reported was at [facility] for cataract surgery and was instructed to come to ED for further eval due to elevated BP (200/80); stated he also seen endocrinologist earlier today and pressure was 140s/80s; denies pain when asked; reports compliance with prescribed BP meds.

## 2022-10-11 ENCOUNTER — Telehealth: Payer: Self-pay | Admitting: Cardiology

## 2022-10-11 NOTE — Telephone Encounter (Signed)
Patient called triage to report he went to the ED yesterday and was treated for elevated BP.  Today: 10:30  182/78 10:40  179/79 11:00   189/69 While on phone 132/86, P 77 1.5 hours after BP meds. Reviewed cardiac meds with patient. He has on med list olmesartan, but has not been taking it. He is going to call his pharmacy to find out who wrote the order in September 2023.

## 2022-10-11 NOTE — Telephone Encounter (Signed)
Patient is calling to talk with Dr. Percival Spanish or nurse. Please call back

## 2022-10-11 NOTE — ED Provider Notes (Signed)
[place] EMERGENCY DEPARTMENT Provider Note   CSN: [id number] Arrival date & time: 10/10/22  1633     History  Chief Complaint  Patient presents with   Hypertension    Alec [name] is a 77 y.o. male with an extensive past medical history that includes history of hyperlipidemia, hypertension, chronic renal insufficiency, type 2 diabetes, status post CABG x 3 who presents to the emergency department due to hypertension.  Patient states that he was at his endocrinologist's office this morning and had a systolic blood pressure of 142.  He went to his ophthalmologist to be assessed for cataract surgery and while there his systolic blood pressure was greater than 200.  He states it is never that high and was shocked.  He states that he was told that he could either willingly choose to go to the emergency department and an ambulance or by car but that he had to go.  He has no symptoms of hypertension including no chest pain, no shortness of breath, no peripheral edema, no exertional dyspnea, no changes in vision, headache, weakness or other strokelike symptoms.  He has no recent medication changes.  He has not been able to take his evening dose of medication.  He takes clonidine, Imdur, metoprolol 50 mg twice daily and hydralazine 100 mg 3 times daily.   Hypertension       Home Medications Prior to Admission medications   Medication Sig Start Date End Date Taking? Authorizing Provider  allopurinol (ZYLOPRIM) 100 MG tablet Take 200 mg by mouth daily.     [provider]  aspirin EC 81 MG tablet Take 81 mg by mouth daily.    [provider]  atorvastatin (LIPITOR) 40 MG tablet Take 1 tablet (40 mg total) by mouth daily. 05/21/22   Minus Breeding, MD  benzonatate (TESSALON) 100 MG capsule Take 1 capsule (100 mg total) by mouth every 8 (eight) hours. 07/15/22   Dorie Rank, MD  Blood Glucose Monitoring Suppl (FREESTYLE FREEDOM LITE) w/Device KIT Use to check blood  sugar three times daily. DX:E11.65 02/02/20   Elayne Snare, MD  Boswellia-Glucosamine-Vit D (OSTEO BI-FLEX ONE PER DAY PO) Take 1 tablet by mouth daily.    [provider]  Cholecalciferol (VITAMIN D3 PO) Take 1 capsule by mouth daily.    [provider]  CINNAMON PO Take 500 mg by mouth daily.    [provider]  cloNIDine (CATAPRES) 0.1 MG tablet Take 1 tablet (0.1 mg total) by mouth 2 (two) times daily. 05/31/22   Minus Breeding, MD  furosemide (LASIX) 20 MG tablet Take 1 tablet (20 mg total) by mouth daily. 09/05/22   Minus Breeding, MD  hydrALAZINE (APRESOLINE) 100 MG tablet Take 1 tablet (100 mg total) by mouth 3 (three) times daily. 03/29/22   Barton Dubois, MD  insulin aspart (NOVOLOG) 100 UNIT/ML injection Use max of 85 units daily via insulin pump 04/16/22   Elayne Snare, MD  isosorbide mononitrate (IMDUR) 120 MG 24 hr tablet Take 1 tablet (120 mg total) by mouth daily. 12/20/21 12/15/22  Minus Breeding, MD  latanoprost (XALATAN) 0.005 % ophthalmic solution Place 1 drop into both eyes at bedtime. 09/18/21   [provider]  metoprolol tartrate (LOPRESSOR) 50 MG tablet Take 1 tablet (50 mg total) by mouth 2 (two) times daily. 05/13/22   Minus Breeding, MD  Misc Natural Products (TART CHERRY ADVANCED PO) Take 1 tablet by mouth daily.    [provider]  Misc Natural  Products (TART CHERRY ADVANCED) CAPS Take 1,000 mg by mouth daily in the afternoon.    [provider]  Multiple Vitamins-Minerals (CENTRUM SILVER PO) Take 1 tablet by mouth daily.    [provider]  nitroGLYCERIN (NITROSTAT) 0.4 MG SL tablet Place 1 tablet (0.4 mg total) under the tongue every 5 (five) minutes as needed for chest pain. 10/26/21 09/05/22  Minus Breeding, MD  olmesartan (BENICAR) 5 MG tablet Take 5 mg by mouth daily. 06/15/22   [provider]  sertraline (ZOLOFT) 100 MG tablet Take 1 tablet (100 mg total) by mouth daily. 05/31/22   Minus Breeding, MD   tamsulosin (FLOMAX) 0.4 MG CAPS capsule Take 0.4 mg by mouth at bedtime.    [provider]      Allergies    Nsaids    Review of Systems   Review of Systems  Physical Exam Updated Vital Signs BP (!) 184/72   Pulse (!) 52   Temp 98 F (36.7 C) (Oral)   Resp 18   Ht '5\' 6"'$  (1.676 m)   Wt 90.4 kg   SpO2 97%   BMI 32.15 kg/m  Physical Exam Physical Exam  Nursing note and vitals reviewed. Constitutional: He appears well-developed and well-nourished. No distress.  HENT:  Head: Normocephalic and atraumatic.  Eyes: Conjunctivae normal are normal. No scleral icterus.  Neck: Normal range of motion. Neck supple.  Cardiovascular: Normal rate, regular rhythm and normal heart sounds.   Pulmonary/Chest: Effort normal and breath sounds normal. No respiratory distress.  Abdominal: Soft. There is no tenderness.  Musculoskeletal: He exhibits no edema.  Neurological: He is alert.  Skin: Skin is warm and dry. He is not diaphoretic.  Psychiatric: His behavior is normal.   ED Results / Procedures / Treatments   Labs (all labs ordered are listed, but only abnormal results are displayed) Labs Reviewed  CBC WITH DIFFERENTIAL/PLATELET - Abnormal; Notable for the following components:      Result Value   RBC 3.83 (*)    Hemoglobin 12.3 (*)    HCT 37.0 (*)    Platelets 117 (*)    All other components within normal limits  COMPREHENSIVE METABOLIC PANEL - Abnormal; Notable for the following components:   Glucose, Bld 106 (*)    BUN 39 (*)    Creatinine, Ser 2.74 (*)    GFR, Estimated 24 (*)    All other components within normal limits  TROPONIN I (HIGH SENSITIVITY) - Abnormal; Notable for the following components:   Troponin I (High Sensitivity) 32 (*)    All other components within normal limits  TROPONIN I (HIGH SENSITIVITY) - Abnormal; Notable for the following components:   Troponin I (High Sensitivity) 36 (*)    All other components within normal limits    EKG EKG  Interpretation  Date/Time:  Thursday October 10 2022 16:59:31 EST Ventricular Rate:  54 PR Interval:  168 QRS Duration: 78 QT Interval:  484 QTC Calculation: 458 R Axis:   49 Text Interpretation: Sinus bradycardia T wave abnormality, consider lateral ischemia Abnormal ECG When compared with ECG of 15-Jul-2022 16:45, PREVIOUS ECG IS PRESENT No significant change since last tracing Confirmed by Gareth Morgan [phone]) on 10/10/2022 6:48:20 PM  Radiology No results found.  Procedures Procedures    Medications Ordered in ED Medications  metoprolol tartrate (LOPRESSOR) tablet 50 mg (50 mg Oral Given 10/10/22 2018)  hydrALAZINE (APRESOLINE) tablet 100 mg (100 mg Oral Given 10/10/22 2019)    ED Course/ Medical Decision  Making/ A&P                           Medical Decision Making 77 year old gentleman here with a history of chronic hypertension who presents due to elevated blood pressure reading at an office in the outpatient setting today. The differential diagnosis for hypertension includes but is not limited to hypertensive emergency, hypertensive urgency, stroke, sympathomimetic ingestion, acute pulmonary edema, ischemic stroke, intracranial hemorrhage, preeclampsia or eclampsia, autonomic dysreflexia, acute glomerulonephritis, type I MI, volume overload, urinary obstruction, pain, renal artery stenosis, polycystic kidney disease, Cushing syndrome, OSA, pheochromocytoma, hyperaldosteronism, hypothyroidism, anxiety  After review of all data points patient appears to have asymptomatic hypertensive urgency.  I reviewed the patient's labs that shows CBC which has a mild anemia, mild thrombocytopenia of insignificant value, CMP with chronic renal insufficiency unchanged and baseline for the patient. Patient troponin just above normal and fairly flat over 2 hours.  I think that his elevated troponin is multifactorial and does not represent hypertensive emergency or ACS.  I think that his  troponin is slightly elevated due to the fact that he was had significant hypertension today above his regular fairly well-controlled blood pressure as well as his chronic renal insufficiency decreasing level of clearance and therefore creating elevated troponin levels of insignificant value.  In shared decision-making with the patient and given his reported outpatient blood pressure of 196 systolic this morning I do not think it is wise to change his medications.  He should follow closely with his prescribing physician, Dr. Percival Spanish for further management.  He should make sure he is not missing any doses.  I have advised the patient to take his blood pressure once daily at the same time a day after he is taking his medications.  I also discussed plan and findings with the patient's daughter via telephone.  I discussed reasons to seek immediate medical care in the emergency department for his blood pressure.  He appears otherwise appropriate for discharge at this time   Amount and/or Complexity of Data Reviewed Labs: ordered. ECG/medicine tests: ordered and independent interpretation performed.    Details: I interpreted patient's EKG which shows sinus bradycardia at a rate of 54.  He had some ST segment depression in the inferior leads which appear to be unchanged from previous EKGs  Risk Prescription drug management.           Final Clinical Impression(s) / ED Diagnoses Final diagnoses:  Hypertensive urgency    Rx / DC Orders ED Discharge Orders     None         Margarita Mail, PA-C 10/11/22 1226    Gareth Morgan, MD 10/11/22 1415

## 2022-10-14 ENCOUNTER — Telehealth: Payer: Self-pay

## 2022-10-14 NOTE — Telephone Encounter (Signed)
Spoke with pt, aware we need the persons doing his surgery to send [place] a fax regarding clearance. Fax number given to the patient.

## 2022-10-14 NOTE — Telephone Encounter (Signed)
   Pre-operative Risk Assessment    Patient Name: Alec [name]  DOB: Sep 30, 1946 MRN: [id number]      Request for Surgical Clearance    Procedure:   Cataract Extraction by PE, IOL Right under  Date of Surgery:  Clearance 10/24/22                                 Surgeon:  DR Omelia Blackwater Surgeon's Group or Practice Name:  [facility] Phone number:  [phone] Fax number:  [phone]   Type of Clearance Requested:   - Medical    Type of Anesthesia:  Not Indicated   Additional requests/questions:  Please advise surgeon/provider what medications should be held. Please fax a copy of cardiac clearance  to the surgeon's office.  Signed, Jeanmarie Plant Jireh Vinas  CCMA 10/14/2022, 12:18 PM

## 2022-10-14 NOTE — Telephone Encounter (Signed)
Spoke with patient and informed him to keep a BP/P diary. He will take BP/P 3 X daily at random times for 2 weeks and report results to our clinic. He started taking olmesartan '5mg'$  daily, as prescribed by nephrologist, on 10/11/22. This AM SBP 120. He was at work and didn't remember the rest. He asked if he has been cleared for eye surgery on 1/25 from [facility] with Dr. Alanda Slim.

## 2022-10-17 DIAGNOSIS — D225 Melanocytic nevi of trunk: Secondary | ICD-10-CM | POA: Diagnosis not present

## 2022-10-17 DIAGNOSIS — L0101 Non-bullous impetigo: Secondary | ICD-10-CM | POA: Diagnosis not present

## 2022-10-17 DIAGNOSIS — L814 Other melanin hyperpigmentation: Secondary | ICD-10-CM | POA: Diagnosis not present

## 2022-10-17 DIAGNOSIS — L300 Nummular dermatitis: Secondary | ICD-10-CM | POA: Diagnosis not present

## 2022-10-17 DIAGNOSIS — L821 Other seborrheic keratosis: Secondary | ICD-10-CM | POA: Diagnosis not present

## 2022-10-18 ENCOUNTER — Telehealth: Payer: Self-pay

## 2022-10-18 ENCOUNTER — Encounter: Payer: Self-pay | Admitting: Endocrinology

## 2022-10-18 NOTE — Telephone Encounter (Signed)
Returned patient's call. He asked about his surgical clearance. Informed patient the request was submitted on 1/15 and he should get the clearance soon. He stated he will check with surgeon's office on Monday.

## 2022-10-20 NOTE — Progress Notes (Signed)
Cardiology Clinic Note   Patient Name: Alec [name] Date of Encounter: 10/22/2022  Primary Care Provider:  Mayra Neer, MD Primary Cardiologist:  Minus Breeding, MD  Patient Profile    Alec [name] presents to the clinic today for follow-up evaluation of his chronic diastolic CHF, HTN, and preoperative cardiac evaluation.  Past Medical History    Past Medical History:  Diagnosis Date   Anemia    Anxiety    Aortic insufficiency    Arthritis    Bilateral lower extremity edema    Chronic gout    06-19-2020  per pt last episode 6 months , great toe   CKD (chronic kidney disease), stage IV [facility])    Coronary artery disease cardiologist--- dr hochrein   CABG 2019   Degenerative arthritis of shoulder region 08/2013   left   History of bladder cancer 07/2008   s/p  TURBT   History of cellulitis 11/2017   left upper arm   History of kidney stones    Hyperlipidemia    Hypertension    followed by pcp/ cardiology   IDDM (insulin dependent diabetes mellitus)    endocrinologist--- dr Dwyane Dee---  pt uses insulin pump and dexcom  (06-19-2020 per pt fasting sugar-- 98--110)   Insulin pump in place    Mitral regurgitation    OSA (obstructive sleep apnea)    Prostate cancer [facility]) urologist--- dr herrick/  oncologist--- [place]   dx 11/ 2017  Gleason 3+3 active survillance;  bx 03-14-2020 Gleason 3+4   RA (rheumatoid arthritis) ([place])    rhemotologist--- dr Normajean Glasgow---     S/P CABG x 3 07/15/2018   LIMA to LAD;  SVG to PDA;  SVG to RI   Thrombocytopenia [facility])    Past Surgical History:  Procedure Laterality Date   CHOLECYSTECTOMY  08/29/2012   Procedure: LAPAROSCOPIC CHOLECYSTECTOMY WITH INTRAOPERATIVE CHOLANGIOGRAM;  Surgeon: Imogene Burn. Tsuei, MD;  Location: [facility];  Service: General;  Laterality: N/A;   CORONARY ARTERY BYPASS GRAFT N/A 07/20/2018   Procedure: CORONARY ARTERY BYPASS GRAFTING (CABG) times three using left internal mammary artery to the LAD, and using  endoscopically harvested right saphenous vein to PDA and intermedius.;  Surgeon: Grace Isaac, MD;  Location: [place];  Service: Open Heart Surgery;  Laterality: N/A;   LEFT HEART CATH AND CORONARY ANGIOGRAPHY N/A 07/15/2018   Procedure: LEFT HEART CATH AND CORONARY ANGIOGRAPHY;  Surgeon: Leonie Man, MD;  Location: [place] CV [facility];  Service: Cardiovascular;  Laterality: N/A;   RADIOACTIVE SEED IMPLANT N/A 06/21/2020   Procedure: RADIOACTIVE SEED IMPLANT/BRACHYTHERAPY IMPLANT;  Surgeon: Ardis Hughs, MD;  Location: [facility];  Service: Urology;  Laterality: N/A;   SHOULDER ARTHROSCOPY W/ ROTATOR CUFF REPAIR Right 09/03/2013   SHOULDER ARTHROSCOPY WITH SUBACROMIAL DECOMPRESSION, ROTATOR CUFF REPAIR AND BICEP TENDON REPAIR Left 09/03/2013   Procedure: LEFT SHOULDER ARTHROSCOPY WITH EXTENSIVE DEBRIDMENT, DISTAL CLAVICULECTOMY, ROTATOR CUFF REPAIR AND SUBACROMIAL DECOMPRESSION PARTIAL ACRIOMIOPLASTY WITH CORACOACROMIAL RELEASE;  Surgeon: Renette Butters, MD;  Location: [place];  Service: Orthopedics;  Laterality: Left;   SPACE OAR INSTILLATION N/A 06/21/2020   Procedure: SPACE OAR INSTILLATION;  Surgeon: Ardis Hughs, MD;  Location: [facility];  Service: Urology;  Laterality: N/A;   TEE WITHOUT CARDIOVERSION N/A 07/20/2018   Procedure: TRANSESOPHAGEAL ECHOCARDIOGRAM (TEE);  Surgeon: Grace Isaac, MD;  Location: [place];  Service: Open Heart Surgery;  Laterality: N/A;   TRANSURETHRAL RESECTION OF BLADDER TUMOR WITH MITOMYCIN-C  08/24/2008   '@[facility]'$     Allergies  Allergies  Allergen Reactions   Nsaids Other (See Comments)    Told to avoid by dr     History of Present Illness    Alec [name] is a PMH of HTN, HLD, coronary artery disease status post CABG x3, type 2 diabetes, bradycardia, chronic diastolic CHF, prostate CA, and CKD.  He was admitted to the hospital on 03/27/2022 and discharged on 03/29/2022.  He was  diagnosed with acute on chronic diastolic CHF.  He reported that over the previous 2 weeks he had noticed increasing shortness of breath both at rest and with exertion.  He reported PND.  He denied lower extremity edema.  He denied chest pain.  He did report a productive cough with wheezing.  He was not noted to have fever and reported compliance with his medications.  In the emergency department he was noted to be mildly hypertensive with blood pressure of 160/60.  His BNP was elevated at 1019.  His high-sensitivity troponins were elevated at 19 and 22.  A chest x-ray showed central pulmonary vessel prominence which was consistent with CHF.  His creatinine was 2.46.  His baseline creatinine is somewhere around 1.9.  He received IV furosemide and was transferred to [facility] for further management.  He did not require supplemental oxygen.  He had good urine output.  His creatinine rose to 3.3.  It was recommended that he have a Lasix holiday for suspected overdiuresis.  He was not restarted on olmesartan due to worsening renal function.  Furosemide 20 mg daily was recommended at discharge.  He presented to the clinic 05/07/22 for follow-up evaluation stated he had noticed some dizziness in the mornings over the past 2 weeks.  His blood pressure in clinic was 94/42.  We reviewed his medications.  He and his wife expressed understanding.  We reviewed his recent hospitalization.  He had  weaned himself daily at home.  I had him hold his hydralazine for systolic blood pressure less than 120.  I gave him a salty 6 diet sheet.  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  His creatinine was improving.  We will plan follow-up in 3 to 4 months.  He was seen in follow-up by Dr. Percival Spanish on 09/05/2022.  During that time his weight was stable.  He was rarely taking extra furosemide.  He was following a low-sodium diet.  He was not exercising much.  He denied chest pain, neck and arm discomfort.   He denied lower extremity swelling.  His blood pressure was 120/60.  Follow-up was planned for 12 months.  He presents to the clinic today for follow-up evaluation and preoperative cardiac evaluation.  He states he was being evaluated for cataract surgery and noted to have elevated blood pressure.  He reports that his blood pressure was in the 696E systolic.  He was instructed to follow-up with heart care or present to the emergency department.  He presented to the clinic and was instructed to follow-up in the ED.  He feels that he may have missed his medication prior to his eye surgery visit.  His blood pressure has been well-controlled since that time.  In the clinic today his blood pressure is 130/54.  His pulse is 56.  We reviewed his upcoming cataract surgery.  I will plan follow-up in 12 months.  Today he denies chest pain, shortness of breath, lower extremity edema, fatigue, palpitations, melena, hematuria,  hemoptysis, diaphoresis, weakness, presyncope, syncope, orthopnea, and PND.     Home Medications    Prior to Admission medications   Medication Sig Start Date End Date Taking? Authorizing Provider  allopurinol (ZYLOPRIM) 100 MG tablet Take 200 mg by mouth daily.     [provider]  aspirin EC 81 MG tablet Take 81 mg by mouth daily.    [provider]  atorvastatin (LIPITOR) 40 MG tablet Take 1 tablet (40 mg total) by mouth daily. 11/03/20   Lendon Colonel, NP  Blood Glucose Monitoring Suppl (FREESTYLE FREEDOM LITE) w/Device KIT Use to check blood sugar three times daily. DX:E11.65 Patient not taking: Reported on 04/04/2022 02/02/20   Elayne Snare, MD  Boswellia-Glucosamine-Vit D (OSTEO BI-FLEX ONE PER DAY PO) Take 1 tablet by mouth daily.    [provider]  Cholecalciferol (VITAMIN D3 PO) Take 1 capsule by mouth daily.    [provider]  CINNAMON PO Take 500 mg by mouth daily.    [provider]  cloNIDine (CATAPRES) 0.1 MG tablet Take 1  tablet (0.1 mg total) by mouth 2 (two) times daily. 02/28/21   Minus Breeding, MD  furosemide (LASIX) 20 MG tablet Take 1 tablet (20 mg total) by mouth daily. 03/30/22 03/30/23  Barton Dubois, MD  hydrALAZINE (APRESOLINE) 100 MG tablet Take 1 tablet (100 mg total) by mouth 3 (three) times daily. Patient not taking: Reported on 04/04/2022 03/29/22   Barton Dubois, MD  insulin aspart (NOVOLOG) 100 UNIT/ML injection Use max of 85 units daily via insulin pump 04/16/22   Elayne Snare, MD  isosorbide mononitrate (IMDUR) 120 MG 24 hr tablet Take 1 tablet (120 mg total) by mouth daily. 12/20/21 12/15/22  Minus Breeding, MD  latanoprost (XALATAN) 0.005 % ophthalmic solution Place 1 drop into both eyes at bedtime. 09/18/21   [provider]  metoprolol tartrate (LOPRESSOR) 50 MG tablet TAKE 1 TABLET BY MOUTH TWICE A DAY Patient taking differently: Take 50 mg by mouth 2 (two) times daily. 06/11/21   Minus Breeding, MD  Misc Natural Products (TART CHERRY ADVANCED PO) Take 1 tablet by mouth daily.    [provider]  Misc Natural Products (TART CHERRY ADVANCED) CAPS Take 1,000 mg by mouth daily in the afternoon.    [provider]  Multiple Vitamins-Minerals (CENTRUM SILVER PO) Take 1 tablet by mouth daily.    [provider]  nitroGLYCERIN (NITROSTAT) 0.4 MG SL tablet Place 1 tablet (0.4 mg total) under the tongue every 5 (five) minutes as needed for chest pain. Patient not taking: Reported on 04/04/2022 10/26/21 03/27/22  Minus Breeding, MD  sertraline (ZOLOFT) 100 MG tablet Take 100 mg by mouth daily. 02/25/20   [provider]  tamsulosin (FLOMAX) 0.4 MG CAPS capsule Take 0.4 mg by mouth at bedtime.    [provider]    Family History    Family History  Problem Relation Age of Onset   Diabetes Mother    Lung cancer Mother    Cancer Father    Coronary artery disease Father        MI at age 12.    Lung cancer Father    Sudden death Son    CAD Maternal  Grandfather        MI in his 42's   CAD Paternal Grandfather    Breast cancer Neg Hx    Colon cancer Neg Hx    Pancreatic cancer Neg Hx    He indicated that his mother  is deceased. He indicated that his father is deceased. He indicated that all of his three brothers are alive. He indicated that his maternal grandmother is deceased. He indicated that his maternal grandfather is deceased. He indicated that his paternal grandmother is deceased. He indicated that his paternal grandfather is deceased. He indicated that his daughter is alive. He indicated that his son is deceased. He indicated that the status of his neg hx is unknown.  Social History    Social History   Socioeconomic History   Marital status: Married    Spouse name: Not on file   Number of children: Not on file   Years of education: Not on file   Highest education level: Not on file  Occupational History   Not on file  Tobacco Use   Smoking status: Former    Packs/day: 2.00    Years: 25.00    Total pack years: 50.00    Types: Cigarettes    Quit date: 09/11/1997    Years since quitting: 25.1   Smokeless tobacco: Never  Vaping Use   Vaping Use: Never used  Substance and Sexual Activity   Alcohol use: Not Currently   Drug use: Never   Sexual activity: Yes  Other Topics Concern   Not on file  Social History Narrative   Not on file   Social Determinants of Health   Financial Resource Strain: Not on file  Food Insecurity: Not on file  Transportation Needs: Not on file  Physical Activity: Not on file  Stress: Not on file  Social Connections: Not on file  Intimate Partner Violence: Not on file     Review of Systems    General:  No chills, fever, night sweats or weight changes.  Cardiovascular:  No chest pain, dyspnea on exertion, edema, orthopnea, palpitations, paroxysmal nocturnal dyspnea. Dermatological: No rash, lesions/masses Respiratory: No cough, dyspnea Urologic: No hematuria, dysuria Abdominal:    No nausea, vomiting, diarrhea, bright red blood per rectum, melena, or hematemesis Neurologic:  No visual changes, wkns, changes in mental status. All other systems reviewed and are otherwise negative except as noted above.  Physical Exam    VS:  BP (!) 130/54 (BP Location: Right Arm, Patient Position: Sitting, Cuff Size: Normal)   Pulse (!) 56   Ht '5\' 6"'$  (1.676 m)   Wt 199 lb (90.3 kg)   BMI 32.12 kg/m  , BMI Body mass index is 32.12 kg/m. GEN: Well nourished, well developed, in no acute distress. HEENT: normal. Neck: Supple, no JVD, carotid bruits, or masses. Cardiac: RRR, no murmurs, rubs, or gallops. No clubbing, cyanosis, edema.  Radials/DP/PT 2+ and equal bilaterally.  Respiratory:  Respirations regular and unlabored, clear to auscultation bilaterally. GI: Soft, nontender, nondistended, BS + x 4. MS: no deformity or atrophy. Skin: warm and dry, no rash. Neuro:  Strength and sensation are intact. Psych: Normal affect.  Accessory Clinical Findings    Recent Labs: 03/29/2022: TSH 2.735 07/15/2022: B Natriuretic Peptide 778.4 10/10/2022: ALT 29; BUN 39; Creatinine, Ser 2.74; Hemoglobin 12.3; Platelets 117; Potassium 3.9; Sodium 140   Recent Lipid Panel    Component Value Date/Time   CHOL 102 03/29/2022 0217   CHOL 116 11/03/2020 0908   TRIG 107 03/29/2022 0217   HDL 24 (L) 03/29/2022 0217   HDL 33 (L) 11/03/2020 0908   CHOLHDL 4.3 03/29/2022 0217   VLDL 21 03/29/2022 0217   LDLCALC 57 03/29/2022 0217   LDLCALC 63 11/03/2020 0908    ECG personally reviewed  by me today-none today.  Echocardiogram 03/28/2022  IMPRESSIONS     1. Left ventricular ejection fraction, by estimation, is 55 to 60%. The  left ventricle has normal function. The left ventricle has no regional  wall motion abnormalities. Left ventricular diastolic parameters are  consistent with Grade II diastolic  dysfunction (pseudonormalization).   2. Right ventricular systolic function is normal. The  right ventricular  size is normal. Tricuspid regurgitation signal is inadequate for assessing  PA pressure.   3. Left atrial size was mildly dilated.   4. The mitral valve is normal in structure. Mild mitral valve  regurgitation. No evidence of mitral stenosis.   5. The aortic valve is tricuspid. Aortic valve regurgitation is trivial.  Aortic valve sclerosis/calcification is present, without any evidence of  aortic stenosis. Aortic valve Vmax measures 1.39 m/s.   6. Pulmonic valve regurgitation is moderate.   7. The inferior vena cava is dilated in size with >50% respiratory  variability, suggesting right atrial pressure of 8 mmHg.   Assessment & Plan   1.  Essential hypertension-BP today 130/54.  Was noted to have elevated blood pressure at the [facility] and was instructed to follow-up with heart care, emergency department.  This appears to be in the setting of forgetting to take medication. Continue hydralazine, clonidine, Imdur, olmesartan Heart healthy low-sodium diet-salty 6 reviewed Increase physical activity as tolerated Maintain blood pressure log  Chronic diastolic CHF-euvolemic today.  Weight today199 lbs.  No increased DOE or activity intolerance.  Continues to work for [employer] 3 days/week. Continue Imdur, hydralazine, furosemide Heart healthy low-sodium diet-salty 6 given Increase physical activity as tolerated Daily weights-contact office weight increase of 2-3 pounds overnight or 5 pounds in 1 week  Coronary artery disease-no chest pain today.  Status post CABG x3 (2019). Continue aspirin, Lipitor, Imdur, Heart healthy low-sodium diet-salty 6 given Increase physical activity as tolerated  CKD-creatinine 2.74 on 10/10/22.  Baseline creatinine around 1.90. Avoid nephrotoxic agents Follows with nephrology  Preoperative cardiac evaluation-cataract extraction, right eye, 10/24/2022, Dr. Alanda Slim, [facility]    Primary Cardiologist: Minus Breeding, MD  Chart  reviewed as part of pre-operative protocol coverage. Given past medical history and time since last visit, based on ACC/AHA guidelines, COBEN GODSHALL would be at acceptable risk for the planned procedure without further cardiovascular testing.   He is low risk for his upcoming procedure and is able to complete greater than 4 METS of physical activity.  Patient was advised that if he develops new symptoms prior to surgery to contact our office to arrange a follow-up appointment.  He verbalized understanding.     Disposition: Follow-up with Dr. Percival Spanish in 12 months.  Jossie Ng. Lawrencia Mauney NP-C     10/22/2022, 8:34 AM [place] [address] Office [phone] Fax [phone]  Notice: This dictation was prepared with Dragon dictation along with smaller phrase technology. Any transcriptional errors that result from this process are unintentional and may not be corrected upon review.  I spent 13 minutes examining this patient, reviewing medications, and using patient centered shared decision making involving her cardiac care.  Prior to her visit I spent greater than 20 minutes reviewing her past medical history,  medications, and prior cardiac tests.

## 2022-10-22 ENCOUNTER — Ambulatory Visit: Payer: Medicare Other | Attending: General Practice | Admitting: General Practice

## 2022-10-22 ENCOUNTER — Encounter: Payer: Self-pay | Admitting: General Practice

## 2022-10-22 VITALS — BP 130/54 | HR 56 | Ht 66.0 in | Wt 199.0 lb

## 2022-10-22 DIAGNOSIS — I10 Essential (primary) hypertension: Secondary | ICD-10-CM | POA: Insufficient documentation

## 2022-10-22 DIAGNOSIS — N184 Chronic kidney disease, stage 4 (severe): Secondary | ICD-10-CM | POA: Insufficient documentation

## 2022-10-22 DIAGNOSIS — Z01810 Encounter for preprocedural cardiovascular examination: Secondary | ICD-10-CM | POA: Insufficient documentation

## 2022-10-22 DIAGNOSIS — I2510 Atherosclerotic heart disease of native coronary artery without angina pectoris: Secondary | ICD-10-CM | POA: Insufficient documentation

## 2022-10-22 DIAGNOSIS — I5032 Chronic diastolic (congestive) heart failure: Secondary | ICD-10-CM | POA: Diagnosis not present

## 2022-10-22 NOTE — Patient Instructions (Signed)
Medication Instructions:  The current medical regimen is effective;  continue present plan and medications as directed. Please refer to the Current Medication list given to you today.  *If you need a refill on your cardiac medications before your next appointment, please call your pharmacy*  Lab Work: NONE If you have labs (blood work) drawn today and your tests are completely normal, you will receive your results only by:  [place] (if you have MyChart) OR  A paper copy in the mail If you have any lab test that is abnormal or we need to change your treatment, we will call you to review the results.  Other Instructions LOW RISK FOR  UPCOMING SURGERY  Follow-Up: At [facility], you and your health needs are our priority.  As part of our continuing mission to provide you with exceptional heart care, we have created designated Provider Care Teams.  These Care Teams include your primary Cardiologist (physician) and Advanced Practice Providers (APPs -  Physician Assistants and Nurse Practitioners) who all work together to provide you with the care you need, when you need it.  Your next appointment:   12 month(s)  Provider:   Minus Breeding, MD

## 2022-10-23 ENCOUNTER — Telehealth: Payer: Self-pay | Admitting: Cardiology

## 2022-10-23 NOTE — Telephone Encounter (Signed)
I s/w Nevin Bloodgood with Dr. Alanda Slim office. I assured Nevin Bloodgood that the pt has been cleared by Coletta Memos, FNP yesterday 10/22/22. I will fax over the ov notes from [place]. I informed Nevin Bloodgood that the clearance info they need is down in the A&P section of the ov note. Nevin Bloodgood thanked me for the help.

## 2022-10-23 NOTE — Telephone Encounter (Signed)
Follow Up:     Alec [name] is calling  to check on the status of patient's clearance for tomorrow(10-24-22). Please fax this asap to [phone].

## 2022-10-24 ENCOUNTER — Ambulatory Visit: Payer: Medicare Other | Admitting: Cardiology

## 2022-10-24 DIAGNOSIS — H401111 Primary open-angle glaucoma, right eye, mild stage: Secondary | ICD-10-CM | POA: Diagnosis not present

## 2022-10-24 DIAGNOSIS — H25811 Combined forms of age-related cataract, right eye: Secondary | ICD-10-CM | POA: Diagnosis not present

## 2022-10-24 DIAGNOSIS — H409 Unspecified glaucoma: Secondary | ICD-10-CM | POA: Diagnosis not present

## 2022-10-24 DIAGNOSIS — H269 Unspecified cataract: Secondary | ICD-10-CM | POA: Diagnosis not present

## 2022-10-31 LAB — HM DIABETES EYE EXAM

## 2022-11-07 DIAGNOSIS — H25812 Combined forms of age-related cataract, left eye: Secondary | ICD-10-CM | POA: Diagnosis not present

## 2022-11-07 DIAGNOSIS — H401121 Primary open-angle glaucoma, left eye, mild stage: Secondary | ICD-10-CM | POA: Diagnosis not present

## 2022-11-07 DIAGNOSIS — H409 Unspecified glaucoma: Secondary | ICD-10-CM | POA: Diagnosis not present

## 2022-11-07 DIAGNOSIS — H269 Unspecified cataract: Secondary | ICD-10-CM | POA: Diagnosis not present

## 2022-11-09 LAB — HM DIABETES EYE EXAM

## 2022-11-11 DIAGNOSIS — L0101 Non-bullous impetigo: Secondary | ICD-10-CM | POA: Diagnosis not present

## 2022-11-11 DIAGNOSIS — R202 Paresthesia of skin: Secondary | ICD-10-CM | POA: Diagnosis not present

## 2022-11-11 DIAGNOSIS — L989 Disorder of the skin and subcutaneous tissue, unspecified: Secondary | ICD-10-CM | POA: Diagnosis not present

## 2022-11-11 DIAGNOSIS — L309 Dermatitis, unspecified: Secondary | ICD-10-CM | POA: Diagnosis not present

## 2022-11-15 DIAGNOSIS — C4441 Basal cell carcinoma of skin of scalp and neck: Secondary | ICD-10-CM | POA: Diagnosis not present

## 2022-11-22 ENCOUNTER — Ambulatory Visit (INDEPENDENT_AMBULATORY_CARE_PROVIDER_SITE_OTHER): Payer: Medicare Other | Admitting: Podiatry

## 2022-11-22 ENCOUNTER — Encounter: Payer: Self-pay | Admitting: Podiatry

## 2022-11-22 DIAGNOSIS — M79675 Pain in left toe(s): Secondary | ICD-10-CM

## 2022-11-22 DIAGNOSIS — E1122 Type 2 diabetes mellitus with diabetic chronic kidney disease: Secondary | ICD-10-CM | POA: Diagnosis not present

## 2022-11-22 DIAGNOSIS — M79674 Pain in right toe(s): Secondary | ICD-10-CM | POA: Diagnosis not present

## 2022-11-22 DIAGNOSIS — B351 Tinea unguium: Secondary | ICD-10-CM | POA: Diagnosis not present

## 2022-11-22 DIAGNOSIS — N1832 Chronic kidney disease, stage 3b: Secondary | ICD-10-CM | POA: Diagnosis not present

## 2022-11-22 DIAGNOSIS — Z794 Long term (current) use of insulin: Secondary | ICD-10-CM | POA: Diagnosis not present

## 2022-11-22 NOTE — Progress Notes (Signed)
This patient returns to my office for at risk foot care.  This patient requires this care by a professional since this patient will be at risk due to having diabetes.  This patient is unable to cut nails himself since the patient cannot reach his nails.These nails are painful walking and wearing shoes.  This patient presents for at risk foot care today.  General Appearance  Alert, conversant and in no acute stress.  Vascular  Dorsalis pedis and posterior tibial  pulses are palpable  bilaterally.  Capillary return is within normal limits  bilaterally. Temperature is within normal limits  bilaterally.  Neurologic  Senn-Weinstein monofilament wire test within normal limits  bilaterally. Muscle power within normal limits bilaterally.  Nails Thick disfigured discolored nails with subungual debris  from hallux to fifth toes bilaterally. No evidence of bacterial infection or drainage bilaterally.  Orthopedic  No limitations of motion  feet .  No crepitus or effusions noted.  No bony pathology or digital deformities noted.  Skin  normotropic skin with no porokeratosis noted bilaterally.  No signs of infections or ulcers noted.     Onychomycosis  Pain in right toes  Pain in left toes  Consent was obtained for treatment procedures.   Mechanical debridement of nails 1-5  bilaterally performed with a nail nipper.  Filed with dremel without incident.    Return office visit   3 months                  Told patient to return for periodic foot care and evaluation due to potential at risk complications.   Mammie Meras DPM   

## 2022-12-04 ENCOUNTER — Ambulatory Visit
Admission: EM | Admit: 2022-12-04 | Discharge: 2022-12-04 | Disposition: A | Discharge status: Home / Self Care | Payer: Medicare Other | Attending: Emergency Medicine | Admitting: Emergency Medicine

## 2022-12-04 ENCOUNTER — Encounter: Payer: Self-pay | Admitting: Emergency Medicine

## 2022-12-04 DIAGNOSIS — J069 Acute upper respiratory infection, unspecified: Secondary | ICD-10-CM

## 2022-12-04 MED ORDER — BENZONATATE 100 MG PO CAPS: 100.0000 mg | ORAL_CAPSULE | Freq: Three times a day (TID) | ORAL | 0 refills | Status: DC

## 2022-12-04 MED ORDER — AZITHROMYCIN 250 MG PO TABS: 250.0000 mg | ORAL_TABLET | Freq: Every day | ORAL | 0 refills | Status: DC

## 2022-12-04 MED ORDER — DEXAMETHASONE SODIUM PHOSPHATE 10 MG/ML IJ SOLN
10.0000 mg | Freq: Once | INTRAMUSCULAR | Status: AC
  Administered 2022-12-04: 10 mg via INTRAMUSCULAR

## 2022-12-04 NOTE — ED Triage Notes (Signed)
Symptoms since Friday. Chills initially, sore throat, cough that's worse at night with wheezing, SOB at rest.  Denies hx of smoking, copd, asthma, body aches, N/V/D. Not taking any medication at home to manage symptoms.

## 2022-12-04 NOTE — ED Provider Notes (Signed)
[facility]    CSN: [id number] Arrival date & time: 12/04/22  0804      History   Chief Complaint Chief Complaint  Patient presents with   Cough   Wheezing    HPI Alec [name] is a 77 y.o. male.   Patient presents for evaluation of chills, sore throat, nasal congestion, rhinorrhea, cough, shortness of breath and wheezing present for 5 days.  Chills and sore throat have resolved.  Cough is worse and interfere with sleep ,wheezing making it difficult to lie down flat at night, this breath is experienced with exertion.  No known sick contacts prior.  Decreased appetite but tolerating some food and liquids.  Has not attempted treatment of symptoms.  Denies respiratory history.  Non-smoker.    Past Medical History:  Diagnosis Date   Anemia    Anxiety    Aortic insufficiency    Arthritis    Bilateral lower extremity edema    Chronic gout    06-19-2020  per pt last episode 6 months , great toe   CKD (chronic kidney disease), stage IV [facility])    Coronary artery disease cardiologist--- dr hochrein   CABG 2019   Degenerative arthritis of shoulder region 08/2013   left   History of bladder cancer 07/2008   s/p  TURBT   History of cellulitis 11/2017   left upper arm   History of kidney stones    Hyperlipidemia    Hypertension    followed by pcp/ cardiology   IDDM (insulin dependent diabetes mellitus)    endocrinologist--- dr Dwyane Dee---  pt uses insulin pump and dexcom  (06-19-2020 per pt fasting sugar-- 98--110)   Insulin pump in place    Mitral regurgitation    OSA (obstructive sleep apnea)    Prostate cancer [facility]) urologist--- dr herrick/  oncologist--- [place]   dx 11/ 2017  Gleason 3+3 active survillance;  bx 03-14-2020 Gleason 3+4   RA (rheumatoid arthritis) ([place])    rhemotologist--- dr Normajean Glasgow---     S/P CABG x 3 07/15/2018   LIMA to LAD;  SVG to PDA;  SVG to RI   Thrombocytopenia [facility])     Patient Active Problem List   Diagnosis Date Noted   CKD  (chronic kidney disease), stage IV ([place]) 04/03/2022   Acute on chronic diastolic CHF (congestive heart failure) ([place]) 03/28/2022   Acute-on-chronic kidney injury ([place]) 03/28/2022   Snoring 07/18/2020   Excessive sleepiness 07/18/2020   Witnessed episode of apnea 07/18/2020   Laryngopharyngeal reflux (LPR) 07/04/2020   Lesion of nasal septum 07/04/2020   Leg swelling 04/30/2020   Bradycardia 04/30/2020   Malignant neoplasm of prostate ([place]) 04/18/2020   Educated about COVID-19 virus infection 10/26/2019   Rheumatoid arthritis of multiple sites with negative rheumatoid factor ([place]) 09/06/2019   Shortness of breath 11/10/2018   Cough 11/10/2018   Wheezing 11/10/2018   Coronary artery disease involving native coronary artery of native heart without angina pectoris 11/10/2018   Dyslipidemia 11/10/2018   CRI (chronic renal insufficiency), stage 3 (moderate) ([place]) 08/06/2018   S/P CABG x 3 07/20/2018   Angina, class II ([place]) 07/14/2018   Abnormal stress electrocardiography using treadmill 07/14/2018   Cellulitis of left arm 12/10/2017   Cellulitis 12/09/2017   Diabetic nephropathy associated with type 2 diabetes mellitus ([place]) 12/14/2014   Essential hypertension 12/14/2014   Type 2 diabetes mellitus with chronic kidney disease, with long-term current use of insulin ([place]) 08/29/2012   Dyslipidemia, goal LDL below 70 08/29/2012  Abdominal pain 08/29/2012    Past Surgical History:  Procedure Laterality Date   CHOLECYSTECTOMY  08/29/2012   Procedure: LAPAROSCOPIC CHOLECYSTECTOMY WITH INTRAOPERATIVE CHOLANGIOGRAM;  Surgeon: Imogene Burn. Tsuei, MD;  Location: [facility];  Service: General;  Laterality: N/A;   CORONARY ARTERY BYPASS GRAFT N/A 07/20/2018   Procedure: CORONARY ARTERY BYPASS GRAFTING (CABG) times three using left internal mammary artery to the LAD, and using endoscopically harvested right saphenous vein to PDA and intermedius.;  Surgeon: Grace Isaac, MD;  Location: [place];   Service: Open Heart Surgery;  Laterality: N/A;   LEFT HEART CATH AND CORONARY ANGIOGRAPHY N/A 07/15/2018   Procedure: LEFT HEART CATH AND CORONARY ANGIOGRAPHY;  Surgeon: Leonie Man, MD;  Location: [place] [facility];  Service: Cardiovascular;  Laterality: N/A;   RADIOACTIVE SEED IMPLANT N/A 06/21/2020   Procedure: RADIOACTIVE SEED IMPLANT/BRACHYTHERAPY IMPLANT;  Surgeon: Ardis Hughs, MD;  Location: [facility];  Service: Urology;  Laterality: N/A;   SHOULDER ARTHROSCOPY W/ ROTATOR CUFF REPAIR Right 09/03/2013   SHOULDER ARTHROSCOPY WITH SUBACROMIAL DECOMPRESSION, ROTATOR CUFF REPAIR AND BICEP TENDON REPAIR Left 09/03/2013   Procedure: LEFT SHOULDER ARTHROSCOPY WITH EXTENSIVE DEBRIDMENT, DISTAL CLAVICULECTOMY, ROTATOR CUFF REPAIR AND SUBACROMIAL DECOMPRESSION PARTIAL ACRIOMIOPLASTY WITH CORACOACROMIAL RELEASE;  Surgeon: Renette Butters, MD;  Location: [place];  Service: Orthopedics;  Laterality: Left;   SPACE OAR INSTILLATION N/A 06/21/2020   Procedure: SPACE OAR INSTILLATION;  Surgeon: Ardis Hughs, MD;  Location[facility];  Service: Urology;  Laterality: N/A;   TEE WITHOUT CARDIOVERSION N/A 07/20/2018   Procedure: TRANSESOPHAGEAL ECHOCARDIOGRAM (TEE);  Surgeon: Grace Isaac, MD;  Location: [place];  Service: Open Heart Surgery;  Laterality: N/A;   TRANSURETHRAL RESECTION OF BLADDER TUMOR WITH MITOMYCIN-C  08/24/2008   '@[facility]'$        Home Medications    Prior to Admission medications   Medication Sig Start Date End Date Taking? Authorizing Provider  allopurinol (ZYLOPRIM) 100 MG tablet Take 200 mg by mouth daily.     [provider]  aspirin EC 81 MG tablet Take 81 mg by mouth daily.    [provider]  atorvastatin (LIPITOR) 40 MG tablet Take 1 tablet (40 mg total) by mouth daily. 05/21/22   Minus Breeding, MD  benzonatate (TESSALON) 100 MG capsule Take 1 capsule (100 mg total) by mouth every 8 (eight)  hours. 07/15/22   Dorie Rank, MD  Blood Glucose Monitoring Suppl (FREESTYLE FREEDOM LITE) w/Device KIT Use to check blood sugar three times daily. DX:E11.65 02/02/20   Elayne Snare, MD  Boswellia-Glucosamine-Vit D (OSTEO BI-FLEX ONE PER DAY PO) Take 1 tablet by mouth daily.    [provider]  Cholecalciferol (VITAMIN D3 PO) Take 1 capsule by mouth daily.    [provider]  CINNAMON PO Take 500 mg by mouth daily.    [provider]  cloNIDine (CATAPRES) 0.1 MG tablet Take 1 tablet (0.1 mg total) by mouth 2 (two) times daily. 05/31/22   Minus Breeding, MD  furosemide (LASIX) 20 MG tablet Take 1 tablet (20 mg total) by mouth daily. 09/05/22   Minus Breeding, MD  hydrALAZINE (APRESOLINE) 100 MG tablet Take 1 tablet (100 mg total) by mouth 3 (three) times daily. 03/29/22   Barton Dubois, MD  insulin aspart (NOVOLOG) 100 UNIT/ML injection Use max of 85 units daily via insulin pump 04/16/22   Elayne Snare, MD  isosorbide mononitrate (IMDUR) 120 MG 24 hr tablet Take 1 tablet (120  mg total) by mouth daily. 12/20/21 12/15/22  Minus Breeding, MD  latanoprost (XALATAN) 0.005 % ophthalmic solution Place 1 drop into both eyes at bedtime. 09/18/21   [provider]  metoprolol tartrate (LOPRESSOR) 50 MG tablet Take 1 tablet (50 mg total) by mouth 2 (two) times daily. 05/13/22   Minus Breeding, MD  Misc Natural Products (TART CHERRY ADVANCED PO) Take 1 tablet by mouth daily.    [provider]  Misc Natural Products (TART CHERRY ADVANCED) CAPS Take 1,000 mg by mouth daily in the afternoon.    [provider]  Multiple Vitamins-Minerals (CENTRUM SILVER PO) Take 1 tablet by mouth daily.    [provider]  nitroGLYCERIN (NITROSTAT) 0.4 MG SL tablet Place 1 tablet (0.4 mg total) under the tongue every 5 (five) minutes as needed for chest pain. 10/26/21 09/05/22  Minus Breeding, MD  olmesartan (BENICAR) 5 MG tablet Take 5 mg by mouth daily. 06/15/22   [provider]  sertraline (ZOLOFT) 100 MG tablet Take 1 tablet (100 mg total) by mouth daily. 05/31/22   Minus Breeding, MD  tamsulosin (FLOMAX) 0.4 MG CAPS capsule Take 0.4 mg by mouth at bedtime.    [provider]    Family History Family History  Problem Relation Age of Onset   Diabetes Mother    Lung cancer Mother    Cancer Father    Coronary artery disease Father        MI at age 24.    Lung cancer Father    Sudden death Son    CAD Maternal Grandfather        MI in his 56's   CAD Paternal Grandfather    Breast cancer Neg Hx    Colon cancer Neg Hx    Pancreatic cancer Neg Hx     Social History Social History   Tobacco Use   Smoking status: Former    Packs/day: 2.00    Years: 25.00    Total pack years: 50.00    Types: Cigarettes    Quit date: 09/11/1997    Years since quitting: 25.2   Smokeless tobacco: Never  Vaping Use   Vaping Use: Never used  Substance Use Topics   Alcohol use: Not Currently   Drug use: Never     Allergies   Nsaids   Review of Systems Review of Systems  Constitutional:  Positive for chills. Negative for activity change, appetite change, diaphoresis, fatigue, fever and unexpected weight change.  HENT:  Positive for congestion, rhinorrhea and sore throat. Negative for dental problem, drooling, ear discharge, ear pain, facial swelling, hearing loss, mouth sores, nosebleeds, postnasal drip, sinus pressure, sinus pain, sneezing, tinnitus, trouble swallowing and voice change.   Respiratory:  Positive for cough, shortness of breath and wheezing. Negative for apnea, choking, chest tightness and stridor.   Cardiovascular: Negative.   Gastrointestinal: Negative.   Skin: Negative.      Physical Exam Triage Vital Signs ED Triage Vitals  Enc Vitals Group     BP 12/04/22 0839 121/62     Pulse Rate 12/04/22 0839 60     Resp 12/04/22 0839 16     Temp 12/04/22 0839 98.9 F (37.2 C)     Temp Source 12/04/22 0839 Oral     SpO2  12/04/22 0839 92 %     Weight --      Height --      Head Circumference --      Peak Flow --  Pain Score 12/04/22 0840 0     Pain Loc --      Pain Edu? --      Excl. in [place]? --    No data found.  Updated Vital Signs BP 121/62 (BP Location: Left Arm)   Pulse 60   Temp 98.9 F (37.2 C) (Oral)   Resp 16   SpO2 92%   Visual Acuity Right Eye Distance:   Left Eye Distance:   Bilateral Distance:    Right Eye Near:   Left Eye Near:    Bilateral Near:     Physical Exam Constitutional:      Appearance: Normal appearance.  HENT:     Right Ear: Tympanic membrane, ear canal and external ear normal.     Left Ear: Tympanic membrane, ear canal and external ear normal.     Nose: Congestion and rhinorrhea present.     Mouth/Throat:     Mouth: Mucous membranes are moist.     Pharynx: No posterior oropharyngeal erythema.  Eyes:     Extraocular Movements: Extraocular movements intact.  Cardiovascular:     Rate and Rhythm: Normal rate and regular rhythm.     Pulses: Normal pulses.     Heart sounds: Normal heart sounds.  Pulmonary:     Effort: Pulmonary effort is normal.     Breath sounds: Normal breath sounds.  Skin:    General: Skin is warm and dry.  Neurological:     Mental Status: He is alert and oriented to person, place, and time. Mental status is at baseline.      UC Treatments / Results  Labs (all labs ordered are listed, but only abnormal results are displayed) Labs Reviewed - No data to display  EKG   Radiology No results found.  Procedures Procedures (including critical care time)  Medications Ordered in UC Medications - No data to display  Initial Impression / Assessment and Plan / UC Course  I have reviewed the triage vital signs and the nursing notes.  Pertinent labs & imaging results that were available during my care of the patient were reviewed by me and considered in my medical decision making (see chart for details).    Viral uri With  cough  Patient is in no signs of distress nor toxic appearing.  Vital signs are stable.  Low suspicion for pneumonia, pneumothorax or bronchitis and therefore will defer imaging.  Viral testing deferred due to timeline of illness.  Prescribed azithromycin prophylactically to prevent worsening symptoms.  Decadron injection given in office, discussed effects on blood sugar advised to monitor closely at home, declined oral steroid course.  Prescribed Tessalon for management of coughing.May use additional over-the-counter medications as needed for supportive care.  May follow-up with urgent care as needed if symptoms persist or worsen.   Final Clinical Impressions(s) / UC Diagnoses   Final diagnoses:  None   Discharge Instructions   None    ED Prescriptions   None    PDMP not reviewed this encounter.   Hans [place], [place] 12/04/22 [phone]

## 2022-12-04 NOTE — Discharge Instructions (Signed)
Your symptoms today are most likely being caused by a virus and should steadily improve in time it can take up to 7 to 10 days before you truly start to see a turnaround however things will get better  You have been experiencing wheezing and shortness of breath will prophylactically provide antibiotic to help keep symptoms from worsening, begin azithromycin as directed  You were given an injection of Decadron here in the office to help reduce inflammation and irritation to your airway which should help reduce your shortness of breath and wheezing  May use Tessalon pill every 8 hours as needed to help calm your coughing    You can take Tylenol and/or Ibuprofen as needed for fever reduction and pain relief.   For cough: honey 1/2 to 1 teaspoon (you can dilute the honey in water or another fluid).  You can also use guaifenesin and dextromethorphan for cough. You can use a humidifier for chest congestion and cough.  If you don't have a humidifier, you can sit in the bathroom with the hot shower running.      For sore throat: try warm salt water gargles, cepacol lozenges, throat spray, warm tea or water with lemon/honey, popsicles or ice, or OTC cold relief medicine for throat discomfort.   For congestion: take a daily anti-histamine like Zyrtec, Claritin, and a oral decongestant, such as pseudoephedrine.  You can also use Flonase 1-2 sprays in each nostril daily.   It is important to stay hydrated: drink plenty of fluids (water, gatorade/powerade/pedialyte, juices, or teas) to keep your throat moisturized and help further relieve irritation/discomfort.

## 2022-12-13 DIAGNOSIS — R058 Other specified cough: Secondary | ICD-10-CM | POA: Diagnosis not present

## 2022-12-13 DIAGNOSIS — L989 Disorder of the skin and subcutaneous tissue, unspecified: Secondary | ICD-10-CM | POA: Diagnosis not present

## 2022-12-25 ENCOUNTER — Other Ambulatory Visit: Payer: Self-pay | Admitting: Cardiology

## 2023-01-06 ENCOUNTER — Other Ambulatory Visit: Payer: Self-pay | Admitting: Endocrinology

## 2023-01-06 DIAGNOSIS — E1165 Type 2 diabetes mellitus with hyperglycemia: Secondary | ICD-10-CM

## 2023-01-11 DIAGNOSIS — R6883 Chills (without fever): Secondary | ICD-10-CM | POA: Diagnosis not present

## 2023-01-11 DIAGNOSIS — B349 Viral infection, unspecified: Secondary | ICD-10-CM | POA: Diagnosis not present

## 2023-01-11 DIAGNOSIS — Z03818 Encounter for observation for suspected exposure to other biological agents ruled out: Secondary | ICD-10-CM | POA: Diagnosis not present

## 2023-01-11 DIAGNOSIS — J029 Acute pharyngitis, unspecified: Secondary | ICD-10-CM | POA: Diagnosis not present

## 2023-01-11 DIAGNOSIS — R051 Acute cough: Secondary | ICD-10-CM | POA: Diagnosis not present

## 2023-01-13 DIAGNOSIS — J9801 Acute bronchospasm: Secondary | ICD-10-CM | POA: Diagnosis not present

## 2023-01-13 DIAGNOSIS — J069 Acute upper respiratory infection, unspecified: Secondary | ICD-10-CM | POA: Diagnosis not present

## 2023-01-14 DIAGNOSIS — N184 Chronic kidney disease, stage 4 (severe): Secondary | ICD-10-CM | POA: Diagnosis not present

## 2023-01-23 DIAGNOSIS — I129 Hypertensive chronic kidney disease with stage 1 through stage 4 chronic kidney disease, or unspecified chronic kidney disease: Secondary | ICD-10-CM | POA: Diagnosis not present

## 2023-01-23 DIAGNOSIS — N2581 Secondary hyperparathyroidism of renal origin: Secondary | ICD-10-CM | POA: Diagnosis not present

## 2023-01-23 DIAGNOSIS — D631 Anemia in chronic kidney disease: Secondary | ICD-10-CM | POA: Diagnosis not present

## 2023-01-23 DIAGNOSIS — N184 Chronic kidney disease, stage 4 (severe): Secondary | ICD-10-CM | POA: Diagnosis not present

## 2023-02-06 ENCOUNTER — Other Ambulatory Visit (INDEPENDENT_AMBULATORY_CARE_PROVIDER_SITE_OTHER): Payer: Medicare Other

## 2023-02-06 DIAGNOSIS — E1165 Type 2 diabetes mellitus with hyperglycemia: Secondary | ICD-10-CM

## 2023-02-06 DIAGNOSIS — Z794 Long term (current) use of insulin: Secondary | ICD-10-CM

## 2023-02-06 LAB — HEMOGLOBIN A1C: Hgb A1c MFr Bld: 6.6 % — ABNORMAL HIGH (ref 4.6–6.5)

## 2023-02-06 LAB — GLUCOSE, RANDOM: Glucose, Bld: 106 mg/dL — ABNORMAL HIGH (ref 70–99)

## 2023-02-13 ENCOUNTER — Ambulatory Visit (INDEPENDENT_AMBULATORY_CARE_PROVIDER_SITE_OTHER): Payer: Medicare Other | Admitting: Endocrinology

## 2023-02-13 ENCOUNTER — Encounter: Payer: Self-pay | Admitting: Endocrinology

## 2023-02-13 VITALS — BP 124/78 | HR 55 | Resp 18 | Ht 66.0 in | Wt 204.6 lb

## 2023-02-13 DIAGNOSIS — Z794 Long term (current) use of insulin: Secondary | ICD-10-CM | POA: Diagnosis not present

## 2023-02-13 DIAGNOSIS — E1165 Type 2 diabetes mellitus with hyperglycemia: Secondary | ICD-10-CM | POA: Diagnosis not present

## 2023-02-13 DIAGNOSIS — I10 Essential (primary) hypertension: Secondary | ICD-10-CM

## 2023-02-13 NOTE — Patient Instructions (Signed)
Reduce Starchy and high fat foods  Bolus before all meals and snack  Walk daily

## 2023-02-13 NOTE — Progress Notes (Signed)
Patient ID: Alec [name], male   DOB: 07/01/1946, 77 y.o.   MRN: [id number]             Reason for Appointment:  Follow-up for Type 2 Diabetes    History of Present Illness:          Diagnosis: Type 2 diabetes mellitus, date of diagnosis: 2001       Prior history: He was seen in consultation in 11/2014 when his A1c was 7.4 Since his blood sugars were averaging over 200 he was given Victoza in addition to his basal bolus insulin regimen in 6/16 He started on the V go pump in October 2016  Recent history:    INSULIN regimen  on T-Slim pump:    BASAL rate settings:  Midnight = 2.0, 3 AM = 1.8, 8 AM = 2.4, 4 PM = 2.7, 7 PM = 3.0 and 10 PM = 2.2  Boluses 3-9 units before meals with coverage of 1: 10  Correction factor I: 50 except 1: 150 at 3 AM and 1: 60 at 8 AM  Non-insulin hypoglycemic drugs the patient is taking are: None   His A1c which was quite stable at 6.2 he is now relatively higher at 6.6  Current blood sugar patterns from analysis of his Dexcom sensor download, daily management and problems identified:   Overall blood sugars are generally higher than before even though he still has 72% blood sugars within target range Most of his hyperglycemia is related to missed boluses Also has frequent meals that are not covered adequately and likely when he is getting higher fat meals or eating out His time in range is worse than the last visit Currently not using the sleep mode at night On 1 occasion when he had a blood sugar of 230 at bedtime he put in 90 g of carbs to bring his blood sugar down but this caused hypoglycemia Again has gained a little weight since his last visit Has not had a nutritional consultation for his diabetes since 2021 Is somewhat active when he is working 3 days a week but also does some yard work on the other days Has difficulty affording GLP-1 drugs    Glucose monitoring:  Dexcom  Summary of patterns: On average blood sugars are the highest  in the evenings after 6 PM going into the early part of the night and the lowest around 5 AM OVERNIGHT blood sugars are generally higher late at night till about 2 AM and then declining progressively until about 5 AM and only once or twice having a low sugar between 3-4 AM  Premeal readings are averaging about 150 at lunch and dinner  POSTPRANDIAL readings are on an average not rising excessively compared to Premeal readings However frequently will have significantly high readings after any given of his meals or likely late evening snack also Also sometimes will have a significant rise in blood sugar late at night running into the night No hypoglycemia during the day   Statistics:    CGM use % of time   2-week average/GV 151/29  Time in range     72   % was 84  % Time Above 180 26  % Time above 250 1  % Time Below 70 1      PRE-MEAL  overnight  mornings  afternoon  evening Overall  Glucose range:       Averages:  145 138 159 173     Previously  CGM use %  of time   2-week average/SD 134/40  Time in range 84      %  % Time Above 180 14  % Time above 250   % Time Below 70 2       PRE-MEAL  overnight  mornings  afternoon  evening Overall  Glucose range:       Averages: 120 128 143 146     Self-care:  Meals: 3 meals per day. Lunch  1 pm Dinner 6 pm Breakfast is usually an egg sandwich but sometimes biscuits   Eating balanced meals in the evening.  He will have snacks with granola bars              Dietician visit, most recent: 06/2020.               Weight history:  Wt Readings from Last 3 Encounters:  02/13/23 204 lb 9.6 oz (92.8 kg)  10/22/22 199 lb (90.3 kg)  10/10/22 199 lb 3.2 oz (90.4 kg)    Glycemic control:   Lab Results  Component Value Date   HGBA1C 6.6 (H) 02/06/2023   HGBA1C 6.2 10/04/2022   HGBA1C 6.3 06/12/2022   Lab Results  Component Value Date   MICROALBUR 397.0 (H) 05/25/2021   LDLCALC 57 03/29/2022   CREATININE 2.74 (H) 10/10/2022    Lab Results  Component Value Date   HGB 12.3 (L) 10/10/2022     Past history:  He was not symptomatic at time of diagnosis and was probably treated with Amaryl only.  No details are available of previous management However he thinks he was not given metformin at any time because of his "kidney problem" He had a A1c of over 9% in 2013 and may have been started on insulin at that time. Most likely has been taking Levemir and NovoLog since then A1c records show his results were over 8% in 2014 and in 4/15 but subsequently down to 7.1 in November 2015 Amaryl was stopped in 7/16  Other active problems are addressed in Review of systems   Allergies as of 02/13/2023       Reactions   Nsaids Other (See Comments)   Told to avoid by dr         Medication List        Accurate as of Feb 13, 2023  9:23 AM. If you have any questions, ask your nurse or doctor.          allopurinol 100 MG tablet Commonly known as: ZYLOPRIM Take 200 mg by mouth daily.   aspirin EC 81 MG tablet Take 81 mg by mouth daily.   atorvastatin 40 MG tablet Commonly known as: LIPITOR Take 1 tablet (40 mg total) by mouth daily.   azithromycin 250 MG tablet Commonly known as: ZITHROMAX Take 1 tablet (250 mg total) by mouth daily. Take first 2 tablets together, then 1 every day until finished.   benzonatate 100 MG capsule Commonly known as: TESSALON Take 1 capsule (100 mg total) by mouth every 8 (eight) hours.   CENTRUM SILVER PO Take 1 tablet by mouth daily.   CINNAMON PO Take 500 mg by mouth daily.   cloNIDine 0.1 MG tablet Commonly known as: CATAPRES Take 1 tablet (0.1 mg total) by mouth 2 (two) times daily.   FreeStyle Freedom Lite w/Device Kit Use to check blood sugar three times daily. DX:E11.65   furosemide 20 MG tablet Commonly known as: LASIX Take 1 tablet (20 mg total) by mouth daily.  hydrALAZINE 100 MG tablet Commonly known as: APRESOLINE Take 1 tablet (100 mg total) by  mouth 3 (three) times daily.   isosorbide mononitrate 120 MG 24 hr tablet Commonly known as: IMDUR TAKE 1 TABLET BY MOUTH EVERY DAY   latanoprost 0.005 % ophthalmic solution Commonly known as: XALATAN Place 1 drop into both eyes at bedtime.   metoprolol tartrate 50 MG tablet Commonly known as: LOPRESSOR Take 1 tablet (50 mg total) by mouth 2 (two) times daily.   nitroGLYCERIN 0.4 MG SL tablet Commonly known as: NITROSTAT Place 1 tablet (0.4 mg total) under the tongue every 5 (five) minutes as needed for chest pain.   NovoLOG 100 UNIT/ML injection Generic drug: insulin aspart USE MAX 85 UNITS DAILY VIA INSULIN PUMP   olmesartan 5 MG tablet Commonly known as: BENICAR Take 5 mg by mouth daily.   OSTEO BI-FLEX ONE PER DAY PO Take 1 tablet by mouth daily.   sertraline 100 MG tablet Commonly known as: ZOLOFT Take 1 tablet (100 mg total) by mouth daily.   tamsulosin 0.4 MG Caps capsule Commonly known as: FLOMAX Take 0.4 mg by mouth at bedtime.   TART CHERRY ADVANCED PO Take 1 tablet by mouth daily.   Tart Cherry Advanced Caps Take 1,000 mg by mouth daily in the afternoon.   VITAMIN D3 PO Take 1 capsule by mouth daily.        Allergies:  Allergies  Allergen Reactions   Nsaids Other (See Comments)    Told to avoid by dr     Past Medical History:  Diagnosis Date   Anemia    Anxiety    Aortic insufficiency    Arthritis    Bilateral lower extremity edema    Chronic gout    06-19-2020  per pt last episode 6 months , great toe   CKD (chronic kidney disease), stage IV [facility])    Coronary artery disease cardiologist--- dr hochrein   CABG 2019   Degenerative arthritis of shoulder region 08/2013   left   History of bladder cancer 07/2008   s/p  TURBT   History of cellulitis 11/2017   left upper arm   History of kidney stones    Hyperlipidemia    Hypertension    followed by pcp/ cardiology   IDDM (insulin dependent diabetes mellitus)    endocrinologist--- dr  Lucianne Muss---  pt uses insulin pump and dexcom  (06-19-2020 per pt fasting sugar-- 98--110)   Insulin pump in place    Mitral regurgitation    OSA (obstructive sleep apnea)    Prostate cancer [facility]) urologist--- dr herrick/  oncologist--- manning   dx 11/ 2017  Gleason 3+3 active survillance;  bx 03-14-2020 Gleason 3+4   RA (rheumatoid arthritis) (HCC)    rhemotologist--- dr Marylyn Ishihara---     S/P CABG x 3 07/15/2018   LIMA to LAD;  SVG to PDA;  SVG to RI   Thrombocytopenia [facility])     Past Surgical History:  Procedure Laterality Date   CHOLECYSTECTOMY  08/29/2012   Procedure: LAPAROSCOPIC CHOLECYSTECTOMY WITH INTRAOPERATIVE CHOLANGIOGRAM;  Surgeon: Wilmon Arms. Tsuei, MD;  Location: [facility]S;  Service: General;  Laterality: N/A;   CORONARY ARTERY BYPASS GRAFT N/A 07/20/2018   Procedure: CORONARY ARTERY BYPASS GRAFTING (CABG) times three using left internal mammary artery to the LAD, and using endoscopically harvested right saphenous vein to PDA and intermedius.;  Surgeon: Delight Ovens, MD;  Location: [facility] OR;  Service: Open Heart Surgery;  Laterality: N/A;   LEFT  HEART CATH AND CORONARY ANGIOGRAPHY N/A 07/15/2018   Procedure: LEFT HEART CATH AND CORONARY ANGIOGRAPHY;  Surgeon: Marykay Lex, MD;  Location: [facility] INVASIVE CV LAB;  Service: Cardiovascular;  Laterality: N/A;   RADIOACTIVE SEED IMPLANT N/A 06/21/2020   Procedure: RADIOACTIVE SEED IMPLANT/BRACHYTHERAPY IMPLANT;  Surgeon: Crist Fat, MD;  Location: [facility];  Service: Urology;  Laterality: N/A;   SHOULDER ARTHROSCOPY W/ ROTATOR CUFF REPAIR Right 09/03/2013   SHOULDER ARTHROSCOPY WITH SUBACROMIAL DECOMPRESSION, ROTATOR CUFF REPAIR AND BICEP TENDON REPAIR Left 09/03/2013   Procedure: LEFT SHOULDER ARTHROSCOPY WITH EXTENSIVE DEBRIDMENT, DISTAL CLAVICULECTOMY, ROTATOR CUFF REPAIR AND SUBACROMIAL DECOMPRESSION PARTIAL ACRIOMIOPLASTY WITH CORACOACROMIAL RELEASE;  Surgeon: Sheral Apley, MD;  Location: [facility];  Service: Orthopedics;  Laterality: Left;   SPACE OAR INSTILLATION N/A 06/21/2020   Procedure: SPACE OAR INSTILLATION;  Surgeon: Crist Fat, MD;  Location: [facility];  Service: Urology;  Laterality: N/A;   TEE WITHOUT CARDIOVERSION N/A 07/20/2018   Procedure: TRANSESOPHAGEAL ECHOCARDIOGRAM (TEE);  Surgeon: Delight Ovens, MD;  Location: [facility] OR;  Service: Open Heart Surgery;  Laterality: N/A;   TRANSURETHRAL RESECTION OF BLADDER TUMOR WITH MITOMYCIN-C  08/24/2008   @[facility]     Family History  Problem Relation Age of Onset   Diabetes Mother    Lung cancer Mother    Cancer Father    Coronary artery disease Father        MI at age 87.    Lung cancer Father    Sudden death Son    CAD Maternal Grandfather        MI in his 40's   CAD Paternal Grandfather    Breast cancer Neg Hx    Colon cancer Neg Hx    Pancreatic cancer Neg Hx     Social History:  reports that he quit smoking about 25 years ago. His smoking use included cigarettes. He has a 50.00 pack-year smoking history. He has never used smokeless tobacco. He reports that he does not currently use alcohol. He reports that he does not use drugs.    Review of Systems      HYPERTENSION: Blood pressure followed by cardiologist and nephrologist He also checks at home  He is not on ACE or ARB drugs  BP Readings from Last 3 Encounters:  02/13/23 124/78  12/04/22 121/62  10/22/22 (!) 130/54           Lipids: he is taking Lipitor 40 mg from his PCP Lipids are controlled except for low HDL      Lab Results  Component Value Date   CHOL 102 03/29/2022   HDL 24 (L) 03/29/2022   LDLCALC 57 03/29/2022   TRIG 107 03/29/2022   CHOLHDL 4.3 03/29/2022               CKD: He has been followed by nephrologist for diabetic nephropathy.   Last urine microalbumin ratio is about 200 He is not on an ACE inhibitor/ARB drug   Lab Results  Component Value Date   CREATININE 2.74 (H) 10/10/2022    CREATININE 3.27 (H) 10/04/2022   CREATININE 3.18 (H) 07/15/2022   Lab Results  Component Value Date   K 3.9 10/10/2022    Last foot exam in 2/24  Has neuropathic symptoms controlled with low-dose gabapentin, also on Requip from PCP   Eye exam last: 6/23, report not available   Physical Examination:  BP 124/78   Pulse (!) 55   Resp 18  Ht 5\' 6"  (1.676 m)   Wt 204 lb 9.6 oz (92.8 kg)   SpO2 97%   BMI 33.02 kg/m        ASSESSMENT:  Diabetes type 2, insulin requiring  See history of present illness for detailed discussion of his current management, blood sugar patterns and from his Dexcom data and problems identified:  He is using the T-insulin pump  His A1c is 6.3 and slightly higher  His pump download and Dexcom information was reviewed in detail for the last 2 weeks  Similar to the last couple of visits he has difficulty with postprandial hyperglycemia related to his diet, late or missed boluses and inadequate boluses Although it is improved his total number of boluses he still has some days where he has bolus only once Overall bolus amounts are relatively less compared to his total insulin dose Also not aware of how to do correction boluses   LIPIDS: Followed by PCP  Hypertension well-controlled    PLAN: Basal rate will be reduced at 2 AM down to 1.8 for about an hour and raised at 7 AM up to 2.25 Settings were changed in the office Discussed the need to bolus before starting to eat regardless of Premeal blood sugar Needs to consistently bolus for all snacks  Take correction boluses just by entering the glucose from the sensor and not any carbohydrates when blood sugars are over 250 This was demonstrated to him how to do this on the pump Carbohydrate ratio was changed to 1:30 except 1: 35 at 10 PM  Consultation with nutritionist for meal planning as well as advice on bolusing, generally needs to cut back on high-fat and high carbohydrate meals More  exercise  Follow-up in 4 months   There are no Patient Instructions on file for this visit.   Total visit time including counseling = 30 minutes   Reather Littler 02/13/2023, 9:23 AM   Note: This office note was prepared with Dragon voice recognition system technology. Any transcriptional errors that result from this process are unintentional.

## 2023-02-14 ENCOUNTER — Encounter: Payer: Self-pay | Admitting: Endocrinology

## 2023-02-21 ENCOUNTER — Encounter: Payer: Self-pay | Admitting: Podiatry

## 2023-02-21 ENCOUNTER — Ambulatory Visit (INDEPENDENT_AMBULATORY_CARE_PROVIDER_SITE_OTHER): Payer: Medicare Other | Admitting: Podiatry

## 2023-02-21 DIAGNOSIS — B351 Tinea unguium: Secondary | ICD-10-CM

## 2023-02-21 DIAGNOSIS — Z794 Long term (current) use of insulin: Secondary | ICD-10-CM | POA: Diagnosis not present

## 2023-02-21 DIAGNOSIS — M79675 Pain in left toe(s): Secondary | ICD-10-CM

## 2023-02-21 DIAGNOSIS — N1832 Chronic kidney disease, stage 3b: Secondary | ICD-10-CM | POA: Diagnosis not present

## 2023-02-21 DIAGNOSIS — E1122 Type 2 diabetes mellitus with diabetic chronic kidney disease: Secondary | ICD-10-CM | POA: Diagnosis not present

## 2023-02-21 DIAGNOSIS — M79674 Pain in right toe(s): Secondary | ICD-10-CM | POA: Diagnosis not present

## 2023-02-21 NOTE — Progress Notes (Signed)
This patient returns to my office for at risk foot care.  This patient requires this care by a professional since this patient will be at risk due to having diabetes.  This patient is unable to cut nails himself since the patient cannot reach his nails.These nails are painful walking and wearing shoes.  This patient presents for at risk foot care today. ° °General Appearance  Alert, conversant and in no acute stress. ° °Vascular  Dorsalis pedis and posterior tibial  pulses are palpable  bilaterally.  Capillary return is within normal limits  bilaterally. Temperature is within normal limits  bilaterally. ° °Neurologic  Senn-Weinstein monofilament wire test within normal limits  bilaterally. Muscle power within normal limits bilaterally. ° °Nails Thick disfigured discolored nails with subungual debris  from hallux to fifth toes bilaterally. No evidence of bacterial infection or drainage bilaterally. ° °Orthopedic  No limitations of motion  feet .  No crepitus or effusions noted.  No bony pathology or digital deformities noted. ° °Skin  normotropic skin with no porokeratosis noted bilaterally.  No signs of infections or ulcers noted.    ° °Onychomycosis  Pain in right toes  Pain in left toes ° °Consent was obtained for treatment procedures.   Mechanical debridement of nails 1-5  bilaterally performed with a nail nipper.  Filed with dremel without incident.  ° ° °Return office visit   10 weeks                   Told patient to return for periodic foot care and evaluation due to potential at risk complications. ° ° °Rin Gorton DPM  °

## 2023-02-27 ENCOUNTER — Encounter: Payer: Self-pay | Admitting: Dietician

## 2023-02-27 ENCOUNTER — Encounter: Payer: Medicare Other | Attending: Endocrinology | Admitting: Dietician

## 2023-02-27 VITALS — Ht 66.0 in | Wt 206.0 lb

## 2023-02-27 DIAGNOSIS — Z794 Long term (current) use of insulin: Secondary | ICD-10-CM | POA: Diagnosis not present

## 2023-02-27 DIAGNOSIS — E1122 Type 2 diabetes mellitus with diabetic chronic kidney disease: Secondary | ICD-10-CM | POA: Diagnosis not present

## 2023-02-27 DIAGNOSIS — N1832 Chronic kidney disease, stage 3b: Secondary | ICD-10-CM | POA: Diagnosis not present

## 2023-02-27 NOTE — Progress Notes (Signed)
Diabetes Self-Management Education  Visit Type: First/Initial  Appt. Start Time: 1405 Appt. End Time: 1505  02/28/2023  [name], identified by name and date of birth, is a 77 y.o. male with a diagnosis of Diabetes: Type 2.   ASSESSMENT Patient is here today with his wife. He states that his doctor wants him to get on the kidney transplant list. He is not counting carbohydrates and enters 8 units in his pump before each meal (frequently forgets to bolus). Noted that carb ration is 1:30 except 1:35 at 10 pm  History includes:  Type 2 diabetes (2000), HLD, HTN, CKD, CABGx3, positive proteinuria, gout Medications include:  Novolog via pump, lasix, allopurinol, vitamin D, MVI, cinnamon He uses a T-Slim insulin pump with a Dexcom CGM G6. Labs noted to include A1C 6.6% 02/06/2023 increased from 6.2% 10/04/2022, c-peptide 1.4 (10/18/2019),  BUN 39, Creatinine 2.74, Potassium 3.9.1, eGFR 24 on 10/10/2022   CGM Results from download:   % Time CGM active:   93 %   (Goal >70%)  Average glucose:   155 mg/dL for 14 days  Glucose management indicator:   7 %  Time in range (70-180 mg/dL):   70 %   (Goal >16%)  Time High (181-250 mg/dL):   26 %   (Goal < 10%)  Time Very High (>250 mg/dL):    3 %   (Goal < 5%)  Time Low (54-69 mg/dL):   1 %   (Goal <9%)  Time Very Low (<54 mg/dL):   0 %   (Goal <6%)   Weight hx: 66" 206 lbs 02/27/2023 197 lbs 06/2020 204 lbs 05/24/2020 182 lbs about 3 years ago.   Patient lives with his wife.  She does the shopping and cooking. 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 Avoids added salt.  Chooses some processed foods and foods out.  Height 5\' 6"  (1.676 m), weight 206 lb (93.4 kg). Body mass index is 33.25 kg/m.   Diabetes Self-Management Education - 02/27/23 1422       Visit Information   Visit Type First/Initial      Initial Visit   Diabetes Type Type 2    Date Diagnosed 2000    Are you currently following a meal  plan? No    Are you taking your medications as prescribed? Yes      Health Coping   How would you rate your overall health? Fair      Psychosocial Assessment   Patient Belief/Attitude about Diabetes Motivated to manage diabetes    What is the hardest part about your diabetes right now, causing you the most concern, or is the most worrisome to you about your diabetes?   Making healty food and beverage choices    Self-care barriers None    Self-management support Doctor's office;Family    Other persons present Patient;Spouse/SO    Patient Concerns Nutrition/Meal planning    Special Needs None    Preferred Learning Style No preference indicated    Learning Readiness Ready    How often do you need to have someone help you when you read instructions, pamphlets, or other written materials from your doctor or pharmacy? 1 - Never    What is the last grade level you completed in school? 10      Pre-Education Assessment   Patient understands the diabetes disease and treatment process. Needs Review    Patient understands incorporating nutritional management into lifestyle. Needs Review  Patient undertands incorporating physical activity into lifestyle. Needs Review    Patient understands using medications safely. Needs Review    Patient understands monitoring blood glucose, interpreting and using results Needs Review    Patient understands prevention, detection, and treatment of acute complications. Needs Review    Patient understands prevention, detection, and treatment of chronic complications. Needs Review    Patient understands how to develop strategies to address psychosocial issues. Needs Review    Patient understands how to develop strategies to promote health/change behavior. Needs Review      Complications   Last HgB A1C per patient/outside source 6.6 %   02/06/2023 increased from 6.2% 10/04/22   How often do you check your blood sugar? > 4 times/day    Fasting Blood glucose range  (mg/dL) 84-696    Postprandial Blood glucose range (mg/dL) 295-284;>132    Number of hypoglycemic episodes per month 2    Can you tell when your blood sugar is low? Yes    What do you do if your blood sugar is low? drinks juice    Number of hyperglycemic episodes ( >200mg /dL): Daily    Can you tell when your blood sugar is high? No    Have you had a dilated eye exam in the past 12 months? Yes    Have you had a dental exam in the past 12 months? Yes    Are you checking your feet? Yes    How many days per week are you checking your feet? 4      Dietary Intake   Breakfast 1 poptart    Snack (morning) Lance PB crackers    Lunch roast beef sandwich (deli) on honey wheat bread, mayo    Snack (afternoon) 3 musketeer candy bar    Dinner 1/2 bologna and cheese, lettuce, tomato sandwich on white bread with mayo, handful of french fries    Snack (evening) occasional ice cream    Beverage(s) diet coke or diet pepsi (6-7 mini cans daily), water (small amounts only)      Activity / Exercise   Activity / Exercise Type Light (walking / raking leaves)    How many days per week do you exercise? 3    How many minutes per day do you exercise? 30    Total minutes per week of exercise 90      Patient Education   Previous Diabetes Education Yes (please comment)   2022   Healthy Eating Role of diet in the treatment of diabetes and the relationship between the three main macronutrients and blood glucose level;Food label reading, portion sizes and measuring food.;Plate Method;Meal options for control of blood glucose level and chronic complications.    Being Active Role of exercise on diabetes management, blood pressure control and cardiac health.    Medications Reviewed patients medication for diabetes, action, purpose, timing of dose and side effects.    Monitoring Taught/evaluated CGM (comment);Daily foot exams    Acute complications Taught prevention, symptoms, and  treatment of hypoglycemia - the 15  rule.    Diabetes Stress and Support Identified and addressed patients feelings and concerns about diabetes;Worked with patient to identify barriers to care and solutions      Individualized Goals (developed by patient)   Nutrition General guidelines for healthy choices and portions discussed;Carb counting    Physical Activity Exercise 3-5 times per week;30 minutes per day    Medications take my medication as prescribed    Monitoring  Consistenly use CGM  Problem Solving Eating Pattern    Reducing Risk examine blood glucose patterns;do foot checks daily;treat hypoglycemia with 15 grams of carbs if blood glucose less than 70mg /dL      Post-Education Assessment   Patient understands the diabetes disease and treatment process. Comprehends key points    Patient understands incorporating nutritional management into lifestyle. Needs Review    Patient undertands incorporating physical activity into lifestyle. Comprehends key points    Patient understands using medications safely. Comphrehends key points    Patient understands monitoring blood glucose, interpreting and using results Comprehends key points    Patient understands prevention, detection, and treatment of acute complications. Comprehends key points    Patient understands prevention, detection, and treatment of chronic complications. Comprehends key points    Patient understands how to develop strategies to address psychosocial issues. Comprehends key points    Patient understands how to develop strategies to promote health/change behavior. Needs Review      Outcomes   Expected Outcomes Demonstrated interest in learning. Expect positive outcomes    Future DMSE 3-4 months    Program Status Not Completed             Individualized Plan for Diabetes Self-Management Training:   Learning Objective:  Patient will have a greater understanding of diabetes self-management. Patient education plan is to attend individual and/or group  sessions per assessed needs and concerns.   Plan:   Patient Instructions  Remember to give a bolus via your pump before you eat.  Aim to be active daily or 30 minutes (walking)  (start slow and increase as tolerated)  Drink mostly water.  Change diet coke and diet pepsi to diet sprite.  Choose Low sodium choices (avoid/limit processed meat, processed foods and foods eaten out) Change snacks.  Consider a protein bar (less than 15 grams protein per serving) rather than a candy bar for a snack Change breakfast - egg sandwich at home on [facility] rather than a bacon, egg and cheese biscuit out OR choose oatmeal with nuts and fruit at home Choose low fat Increase your vegetables and fruit.    Expected Outcomes:  Demonstrated interest in learning. Expect positive outcomes  Education material provided: Meal plan card and Snack sheet  If problems or questions, patient to contact team via:  Phone  Future DSME appointment: 3-4 months

## 2023-02-27 NOTE — Patient Instructions (Addendum)
Remember to give a bolus via your pump before you eat.  Aim to be active daily or 30 minutes (walking)  (start slow and increase as tolerated)  Drink mostly water.  Change diet coke and diet pepsi to diet sprite.  Choose Low sodium choices (avoid/limit processed meat, processed foods and foods eaten out) Change snacks.  Consider a protein bar (less than 15 grams protein per serving) rather than a candy bar for a snack Change breakfast - egg sandwich at home on [facility] rather than a bacon, egg and cheese biscuit out OR choose oatmeal with nuts and fruit at home Choose low fat Increase your vegetables and fruit.

## 2023-03-13 ENCOUNTER — Emergency Department (HOSPITAL_BASED_OUTPATIENT_CLINIC_OR_DEPARTMENT_OTHER): Payer: Medicare Other

## 2023-03-13 ENCOUNTER — Encounter (HOSPITAL_BASED_OUTPATIENT_CLINIC_OR_DEPARTMENT_OTHER): Payer: Self-pay | Admitting: Urology

## 2023-03-13 ENCOUNTER — Inpatient Hospital Stay (HOSPITAL_BASED_OUTPATIENT_CLINIC_OR_DEPARTMENT_OTHER)
Admission: EM | Admit: 2023-03-13 | Discharge: 2023-03-19 | DRG: 291 | Disposition: A | Discharge status: Home / Self Care | Payer: Medicare Other | Attending: Student | Admitting: Student

## 2023-03-13 DIAGNOSIS — D61818 Other pancytopenia: Secondary | ICD-10-CM | POA: Diagnosis present

## 2023-03-13 DIAGNOSIS — I10 Essential (primary) hypertension: Secondary | ICD-10-CM | POA: Diagnosis present

## 2023-03-13 DIAGNOSIS — E1122 Type 2 diabetes mellitus with diabetic chronic kidney disease: Secondary | ICD-10-CM | POA: Diagnosis not present

## 2023-03-13 DIAGNOSIS — M19012 Primary osteoarthritis, left shoulder: Secondary | ICD-10-CM | POA: Diagnosis present

## 2023-03-13 DIAGNOSIS — E871 Hypo-osmolality and hyponatremia: Secondary | ICD-10-CM | POA: Diagnosis not present

## 2023-03-13 DIAGNOSIS — Z794 Long term (current) use of insulin: Secondary | ICD-10-CM | POA: Diagnosis not present

## 2023-03-13 DIAGNOSIS — M069 Rheumatoid arthritis, unspecified: Secondary | ICD-10-CM | POA: Diagnosis present

## 2023-03-13 DIAGNOSIS — Z6832 Body mass index (BMI) 32.0-32.9, adult: Secondary | ICD-10-CM | POA: Diagnosis not present

## 2023-03-13 DIAGNOSIS — J439 Emphysema, unspecified: Secondary | ICD-10-CM | POA: Diagnosis present

## 2023-03-13 DIAGNOSIS — E785 Hyperlipidemia, unspecified: Secondary | ICD-10-CM | POA: Diagnosis present

## 2023-03-13 DIAGNOSIS — H409 Unspecified glaucoma: Secondary | ICD-10-CM | POA: Diagnosis present

## 2023-03-13 DIAGNOSIS — J449 Chronic obstructive pulmonary disease, unspecified: Secondary | ICD-10-CM | POA: Diagnosis present

## 2023-03-13 DIAGNOSIS — I130 Hypertensive heart and chronic kidney disease with heart failure and stage 1 through stage 4 chronic kidney disease, or unspecified chronic kidney disease: Principal | ICD-10-CM | POA: Diagnosis present

## 2023-03-13 DIAGNOSIS — I129 Hypertensive chronic kidney disease with stage 1 through stage 4 chronic kidney disease, or unspecified chronic kidney disease: Secondary | ICD-10-CM | POA: Diagnosis not present

## 2023-03-13 DIAGNOSIS — N184 Chronic kidney disease, stage 4 (severe): Secondary | ICD-10-CM | POA: Diagnosis present

## 2023-03-13 DIAGNOSIS — N179 Acute kidney failure, unspecified: Secondary | ICD-10-CM | POA: Diagnosis not present

## 2023-03-13 DIAGNOSIS — N400 Enlarged prostate without lower urinary tract symptoms: Secondary | ICD-10-CM | POA: Diagnosis present

## 2023-03-13 DIAGNOSIS — Z9641 Presence of insulin pump (external) (internal): Secondary | ICD-10-CM | POA: Diagnosis present

## 2023-03-13 DIAGNOSIS — R0602 Shortness of breath: Secondary | ICD-10-CM | POA: Diagnosis not present

## 2023-03-13 DIAGNOSIS — I700 Atherosclerosis of aorta: Secondary | ICD-10-CM | POA: Diagnosis not present

## 2023-03-13 DIAGNOSIS — Z7982 Long term (current) use of aspirin: Secondary | ICD-10-CM

## 2023-03-13 DIAGNOSIS — C61 Malignant neoplasm of prostate: Secondary | ICD-10-CM | POA: Diagnosis not present

## 2023-03-13 DIAGNOSIS — Z951 Presence of aortocoronary bypass graft: Secondary | ICD-10-CM | POA: Diagnosis not present

## 2023-03-13 DIAGNOSIS — Z801 Family history of malignant neoplasm of trachea, bronchus and lung: Secondary | ICD-10-CM

## 2023-03-13 DIAGNOSIS — F32A Depression, unspecified: Secondary | ICD-10-CM | POA: Diagnosis present

## 2023-03-13 DIAGNOSIS — I2510 Atherosclerotic heart disease of native coronary artery without angina pectoris: Secondary | ICD-10-CM | POA: Diagnosis present

## 2023-03-13 DIAGNOSIS — I5033 Acute on chronic diastolic (congestive) heart failure: Secondary | ICD-10-CM | POA: Diagnosis not present

## 2023-03-13 DIAGNOSIS — M109 Gout, unspecified: Secondary | ICD-10-CM | POA: Diagnosis present

## 2023-03-13 DIAGNOSIS — N401 Enlarged prostate with lower urinary tract symptoms: Secondary | ICD-10-CM | POA: Diagnosis not present

## 2023-03-13 DIAGNOSIS — J90 Pleural effusion, not elsewhere classified: Secondary | ICD-10-CM | POA: Diagnosis not present

## 2023-03-13 DIAGNOSIS — N189 Chronic kidney disease, unspecified: Secondary | ICD-10-CM | POA: Diagnosis not present

## 2023-03-13 DIAGNOSIS — J9811 Atelectasis: Secondary | ICD-10-CM | POA: Diagnosis not present

## 2023-03-13 DIAGNOSIS — Z8249 Family history of ischemic heart disease and other diseases of the circulatory system: Secondary | ICD-10-CM

## 2023-03-13 DIAGNOSIS — Z8546 Personal history of malignant neoplasm of prostate: Secondary | ICD-10-CM

## 2023-03-13 DIAGNOSIS — R079 Chest pain, unspecified: Secondary | ICD-10-CM | POA: Diagnosis not present

## 2023-03-13 DIAGNOSIS — Z8551 Personal history of malignant neoplasm of bladder: Secondary | ICD-10-CM

## 2023-03-13 DIAGNOSIS — I5021 Acute systolic (congestive) heart failure: Secondary | ICD-10-CM | POA: Diagnosis not present

## 2023-03-13 DIAGNOSIS — M1A9XX0 Chronic gout, unspecified, without tophus (tophi): Secondary | ICD-10-CM | POA: Diagnosis present

## 2023-03-13 DIAGNOSIS — Z87891 Personal history of nicotine dependence: Secondary | ICD-10-CM

## 2023-03-13 DIAGNOSIS — Z833 Family history of diabetes mellitus: Secondary | ICD-10-CM | POA: Diagnosis not present

## 2023-03-13 DIAGNOSIS — Z9049 Acquired absence of other specified parts of digestive tract: Secondary | ICD-10-CM

## 2023-03-13 DIAGNOSIS — N1832 Chronic kidney disease, stage 3b: Secondary | ICD-10-CM | POA: Diagnosis not present

## 2023-03-13 DIAGNOSIS — G4733 Obstructive sleep apnea (adult) (pediatric): Secondary | ICD-10-CM | POA: Diagnosis present

## 2023-03-13 DIAGNOSIS — R0789 Other chest pain: Secondary | ICD-10-CM | POA: Diagnosis not present

## 2023-03-13 DIAGNOSIS — Z886 Allergy status to analgesic agent status: Secondary | ICD-10-CM

## 2023-03-13 DIAGNOSIS — Z79899 Other long term (current) drug therapy: Secondary | ICD-10-CM

## 2023-03-13 LAB — CBC
HCT: 30.5 % — ABNORMAL LOW (ref 39.0–52.0)
Hemoglobin: 10 g/dL — ABNORMAL LOW (ref 13.0–17.0)
MCH: 32.4 pg (ref 26.0–34.0)
MCHC: 32.8 g/dL (ref 30.0–36.0)
MCV: 98.7 fL (ref 80.0–100.0)
Platelets: 95 10*3/uL — ABNORMAL LOW (ref 150–400)
RBC: 3.09 MIL/uL — ABNORMAL LOW (ref 4.22–5.81)
RDW: 15.3 % (ref 11.5–15.5)
WBC: 2.9 10*3/uL — ABNORMAL LOW (ref 4.0–10.5)
nRBC: 0 % (ref 0.0–0.2)

## 2023-03-13 LAB — BASIC METABOLIC PANEL
Anion gap: 7 (ref 5–15)
BUN: 47 mg/dL — ABNORMAL HIGH (ref 8–23)
CO2: 23 mmol/L (ref 22–32)
Calcium: 9.2 mg/dL (ref 8.9–10.3)
Chloride: 107 mmol/L (ref 98–111)
Creatinine, Ser: 3.29 mg/dL — ABNORMAL HIGH (ref 0.61–1.24)
GFR, Estimated: 19 mL/min — ABNORMAL LOW (ref >60)
Glucose, Bld: 184 mg/dL — ABNORMAL HIGH (ref 70–99)
Potassium: 4.2 mmol/L (ref 3.5–5.1)
Sodium: 137 mmol/L (ref 135–145)

## 2023-03-13 LAB — TROPONIN I (HIGH SENSITIVITY)
Troponin I (High Sensitivity): 27 ng/L — ABNORMAL HIGH (ref <18)
Troponin I (High Sensitivity): 31 ng/L — ABNORMAL HIGH (ref <18)

## 2023-03-13 LAB — BRAIN NATRIURETIC PEPTIDE: B Natriuretic Peptide: 2123.1 pg/mL — ABNORMAL HIGH (ref 0.0–100.0)

## 2023-03-13 MED ORDER — FUROSEMIDE 10 MG/ML IJ SOLN
80.0000 mg | Freq: Once | INTRAMUSCULAR | Status: AC
  Administered 2023-03-13: 80 mg via INTRAVENOUS
  Filled 2023-03-13: qty 8

## 2023-03-13 MED ORDER — HYDRALAZINE HCL 25 MG PO TABS
100.0000 mg | ORAL_TABLET | Freq: Once | ORAL | Status: AC
  Administered 2023-03-13: 100 mg via ORAL
  Filled 2023-03-13: qty 4

## 2023-03-13 MED ORDER — CLONIDINE HCL 0.1 MG PO TABS
0.1000 mg | ORAL_TABLET | Freq: Once | ORAL | Status: AC
  Administered 2023-03-13: 0.1 mg via ORAL
  Filled 2023-03-13: qty 1

## 2023-03-13 NOTE — ED Triage Notes (Signed)
Pt states shortness of breath, chest tightness that started 2 months ago  States SOB worse with exertion, must sleep sitting up to breath   H/o Triple Bypass in 2018

## 2023-03-13 NOTE — ED Provider Notes (Addendum)
[place] [facility] Provider Note   CSN: [id number] Arrival date & time: 03/13/23  1807     History  Chief Complaint  Patient presents with   Shortness of Breath    Alec [name] is a 77 y.o. male.  Patient with a complaint of about 2 months of shortness of breath exertional some chest discomfort that waxes and wanes chest tightness in the substernal area.  Not worse in the last couple days.  But shortness of breath is gotten worse.  He is starting to have to sleep sitting up otherwise he will be out of breath.  Past medical history sniffing for hypertension hyperlipidemia coronary artery disease CABG x 3 in 2018.  Type 2 diabetes chronic diastolic congestive heart failure prostate cancer chronic kidney disease.  Being evaluated for consideration for renal transplant.  Patient is followed by [facility] line cardiology.  Has not seen them recently.  But patient was admitted in June 2023 for CHF exacerbation.  Patient has not taken his Lasix today normally takes in the evening.  Took his morning blood pressure medicines but not taken his evening blood pressure medicines.  Patient is a former smoker he did quit in 1998.       Home Medications Prior to Admission medications   Medication Sig Start Date End Date Taking? Authorizing Provider  allopurinol (ZYLOPRIM) 100 MG tablet Take 200 mg by mouth daily.     [provider]  aspirin EC 81 MG tablet Take 81 mg by mouth daily.    [provider]  atorvastatin (LIPITOR) 40 MG tablet Take 1 tablet (40 mg total) by mouth daily. 05/21/22   Rollene Rotunda, MD  azithromycin (ZITHROMAX) 250 MG tablet Take 1 tablet (250 mg total) by mouth daily. Take first 2 tablets together, then 1 every day until finished. Patient not taking: Reported on 02/13/2023 12/04/22   Valinda Hoar, NP  benzonatate (TESSALON) 100 MG capsule Take 1 capsule (100 mg total) by mouth every 8 (eight) hours. Patient not  taking: Reported on 02/13/2023 12/04/22   Valinda Hoar, NP  Blood Glucose Monitoring Suppl (FREESTYLE FREEDOM LITE) w/Device KIT Use to check blood sugar three times daily. DX:E11.65 Patient not taking: Reported on 02/27/2023 02/02/20   Reather Littler, MD  Boswellia-Glucosamine-Vit D (OSTEO BI-FLEX ONE PER DAY PO) Take 1 tablet by mouth daily.    [provider]  Cholecalciferol (VITAMIN D3 PO) Take 1 capsule by mouth daily.    [provider]  CINNAMON PO Take 500 mg by mouth daily.    [provider]  cloNIDine (CATAPRES) 0.1 MG tablet Take 1 tablet (0.1 mg total) by mouth 2 (two) times daily. 05/31/22   Rollene Rotunda, MD  furosemide (LASIX) 20 MG tablet Take 1 tablet (20 mg total) by mouth daily. 09/05/22   Rollene Rotunda, MD  hydrALAZINE (APRESOLINE) 100 MG tablet Take 1 tablet (100 mg total) by mouth 3 (three) times daily. 03/29/22   Vassie Loll, MD  isosorbide mononitrate (IMDUR) 120 MG 24 hr tablet TAKE 1 TABLET BY MOUTH EVERY DAY Patient not taking: Reported on 02/27/2023 12/25/22   Rollene Rotunda, MD  latanoprost (XALATAN) 0.005 % ophthalmic solution Place 1 drop into both eyes at bedtime. 09/18/21   [provider]  metoprolol tartrate (LOPRESSOR) 50 MG tablet Take 1 tablet (50 mg total) by mouth 2 (two) times daily. 05/13/22   Rollene Rotunda, MD  Misc Natural Products (TART CHERRY ADVANCED PO) Take 1  tablet by mouth daily.    [provider]  Misc Natural Products (TART CHERRY ADVANCED) CAPS Take 1,000 mg by mouth daily in the afternoon.    [provider]  Multiple Vitamins-Minerals (CENTRUM SILVER PO) Take 1 tablet by mouth daily.    [provider]  nitroGLYCERIN (NITROSTAT) 0.4 MG SL tablet Place 1 tablet (0.4 mg total) under the tongue every 5 (five) minutes as needed for chest pain. 10/26/21 09/05/22  Rollene Rotunda, MD  NOVOLOG 100 UNIT/ML injection USE MAX 85 UNITS DAILY VIA INSULIN PUMP 01/07/23   Reather Littler, MD   olmesartan (BENICAR) 5 MG tablet Take 5 mg by mouth daily. 06/15/22   [provider]  sertraline (ZOLOFT) 100 MG tablet Take 1 tablet (100 mg total) by mouth daily. 05/31/22   Rollene Rotunda, MD  tamsulosin (FLOMAX) 0.4 MG CAPS capsule Take 0.4 mg by mouth at bedtime.    [provider]      Allergies    Nsaids    Review of Systems   Review of Systems  Constitutional:  Negative for chills and fever.  HENT:  Negative for ear pain and sore throat.   Eyes:  Negative for pain and visual disturbance.  Respiratory:  Positive for shortness of breath. Negative for cough.   Cardiovascular:  Positive for chest pain. Negative for palpitations and leg swelling.  Gastrointestinal:  Negative for abdominal pain and vomiting.  Genitourinary:  Negative for dysuria and hematuria.  Musculoskeletal:  Negative for arthralgias and back pain.  Skin:  Negative for color change and rash.  Neurological:  Negative for seizures and syncope.  All other systems reviewed and are negative.   Physical Exam Updated Vital Signs BP (!) 173/64   Pulse (!) 54   Temp 98.1 F (36.7 C) (Oral)   Resp 18   Ht 1.676 m (5\' 6" )   Wt 93.4 kg   SpO2 95%   BMI 33.23 kg/m  Physical Exam Vitals and nursing note reviewed.  Constitutional:      General: He is not in acute distress.    Appearance: Normal appearance. He is well-developed. He is not ill-appearing.  HENT:     Head: Normocephalic and atraumatic.     Mouth/Throat:     Mouth: Mucous membranes are moist.  Eyes:     Extraocular Movements: Extraocular movements intact.     Conjunctiva/sclera: Conjunctivae normal.     Pupils: Pupils are equal, round, and reactive to light.  Cardiovascular:     Rate and Rhythm: Normal rate and regular rhythm.     Heart sounds: No murmur heard. Pulmonary:     Effort: Pulmonary effort is normal. No respiratory distress.     Breath sounds: Rhonchi present. No wheezing.  Abdominal:     General: There is  distension.     Palpations: Abdomen is soft.     Tenderness: There is no abdominal tenderness.  Musculoskeletal:        General: No swelling.     Cervical back: Normal range of motion and neck supple. No rigidity.     Right lower leg: No edema.     Left lower leg: No edema.  Skin:    General: Skin is warm and dry.     Capillary Refill: Capillary refill takes less than 2 seconds.  Neurological:     General: No focal deficit present.     Mental Status: He is alert and oriented to person, place, and time.  Psychiatric:  Mood and Affect: Mood normal.     ED Results / Procedures / Treatments   Labs (all labs ordered are listed, but only abnormal results are displayed) Labs Reviewed  BASIC METABOLIC PANEL - Abnormal; Notable for the following components:      Result Value   Glucose, Bld 184 (*)    BUN 47 (*)    Creatinine, Ser 3.29 (*)    GFR, Estimated 19 (*)    All other components within normal limits  CBC - Abnormal; Notable for the following components:   WBC 2.9 (*)    RBC 3.09 (*)    Hemoglobin 10.0 (*)    HCT 30.5 (*)    Platelets 95 (*)    All other components within normal limits  BRAIN NATRIURETIC PEPTIDE - Abnormal; Notable for the following components:   B Natriuretic Peptide 2,123.1 (*)    All other components within normal limits  TROPONIN I (HIGH SENSITIVITY) - Abnormal; Notable for the following components:   Troponin I (High Sensitivity) 27 (*)    All other components within normal limits  TROPONIN I (HIGH SENSITIVITY)    EKG EKG Interpretation  Date/Time:  Thursday March 13 2023 18:19:06 EDT Ventricular Rate:  64 PR Interval:  227 QRS Duration: 87 QT Interval:  420 QTC Calculation: 434 R Axis:   72 Text Interpretation: Sinus rhythm Prolonged PR interval Borderline repolarization abnormality Confirmed by Vanetta Mulders [id number]) on 03/13/2023 7:33:27 PM  Radiology DG Chest 2 View  Result Date: 03/13/2023 CLINICAL DATA:  Chest pain EXAM:  CHEST - 2 VIEW COMPARISON:  07/15/2022 FINDINGS: Prior CABG. Heart and mediastinal contours are within normal limits. No focal opacities or effusions. No acute bony abnormality. IMPRESSION: No active cardiopulmonary disease. Electronically Signed   By: Charlett Nose M.D.   On: 03/13/2023 19:00    Procedures Procedures    Medications Ordered in ED Medications - No data to display  ED Course/ Medical Decision Making/ A&P                             Medical Decision Making Amount and/or Complexity of Data Reviewed Labs: ordered. Radiology: ordered.  Risk Prescription drug management.    Patient's basic metabolic panel here is significant for creatinine 3.29.  This 60 significantly worse than usual for GFR 19.  CBC white count 2.9 hemoglobin 10 platelets 95 K.  Initial troponin 27 will need delta troponin.  BNP markedly elevated at 2123.  When he was admitted in June a year ago his BNP was 1000.  Patient's pressures are elevated somewhat here came in at 183/71 currently 164/65 at rest.  EKG without any acute changes.  Patient most likely will need admission for CHF exacerbation although chest x-ray negative will get CT chest for clarification we will give 80 of Lasix.  And will need delta troponin.  CT chest raises concerns for emphysematous changes.  Says looks like air trapping but possibly could be edema patient's story seems to fit more of the edema.  Plus does have a history of mitral regurg and aortic insufficiency.  He is a former smoker but not formally known to have COPD or emphysema.  The patient's blood pressure medicines is hydralazine 3 times a day and clonidine Catapres.  Patient not on any inhalers.  Feel this is most likely exacerbation of CHF.  Also blood pressure is somewhat elevated.  Will discuss with hospitalist for admission.  Patient states that  he was treated for wheezing about a month ago with a steroid seem to improve he does not feel like he has been wheezing  lately.  Patient not on albuterol inhalers no formal diagnosis of COPD.  Patient's delta troponin was 31 so no significant change.  Will discuss with hospitalist.  Final Clinical Impression(s) / ED Diagnoses Final diagnoses:  SOB (shortness of breath)  Chest pain, unspecified type  Stage 4 chronic kidney disease [facility])    Rx / DC Orders ED Discharge Orders     None         Vanetta Mulders, MD 03/13/23 2004    Vanetta Mulders, MD 03/13/23 2117

## 2023-03-13 NOTE — ED Notes (Signed)
ED TO INPATIENT HANDOFF REPORT  ED Nurse Name and Phone #: Debbe Odea, RN  S Name/Age/Gender Alec [name] 77 y.o. male Room/Bed: MH04/MH04  Code Status   Code Status: Prior  Home/SNF/Other Home Patient oriented to: self, place, time, and situation Is this baseline? Yes   Triage Complete: Triage complete  Chief Complaint Acute on chronic diastolic (congestive) heart failure (HCC) [I50.33]  Triage Note Pt states shortness of breath, chest tightness that started 2 months ago  States SOB worse with exertion, must sleep sitting up to breath   H/o Triple Bypass in 2018    Allergies Allergies  Allergen Reactions   Nsaids Other (See Comments)    Told to avoid by dr     Level of Care/Admitting Diagnosis ED Disposition     ED Disposition  Admit   Condition  --   Comment  Hospital Area: [facility] [[id number]]  Level of Care: Telemetry Cardiac [[id number]]  May admit patient to [facility] or [facility] if equivalent level of care is available:: No  Interfacility transfer: Yes  Covid Evaluation: Asymptomatic - no recent exposure (last 10 days) testing not required  Diagnosis: Acute on chronic diastolic (congestive) heart failure [facility]) [[id number]]  Admitting Physician: Darlin Drop [[id number]]  Attending Physician: Darlin Drop [[id number]]  Certification:: I certify this patient will need inpatient services for at least 2 midnights  Estimated Length of Stay: 2          B Medical/Surgery History Past Medical History:  Diagnosis Date   Anemia    Anxiety    Aortic insufficiency    Arthritis    Bilateral lower extremity edema    Chronic gout    06-19-2020  per pt last episode 6 months , great toe   CKD (chronic kidney disease), stage IV (HCC)    Coronary artery disease cardiologist--- dr hochrein   CABG 2019   Degenerative arthritis of shoulder region 08/2013   left   History of bladder cancer 07/2008   s/p  TURBT   History of cellulitis 11/2017    left upper arm   History of kidney stones    Hyperlipidemia    Hypertension    followed by pcp/ cardiology   IDDM (insulin dependent diabetes mellitus)    endocrinologist--- dr Lucianne Muss---  pt uses insulin pump and dexcom  (06-19-2020 per pt fasting sugar-- 98--110)   Insulin pump in place    Mitral regurgitation    OSA (obstructive sleep apnea)    Prostate cancer [facility]) urologist--- dr herrick/  oncologist--- [place]   dx 11/ 2017  Gleason 3+3 active survillance;  bx 03-14-2020 Gleason 3+4   RA (rheumatoid arthritis) (HCC)    rhemotologist--- dr Marylyn Ishihara---     S/P CABG x 3 07/15/2018   LIMA to LAD;  SVG to PDA;  SVG to RI   Thrombocytopenia [facility])    Past Surgical History:  Procedure Laterality Date   CHOLECYSTECTOMY  08/29/2012   Procedure: LAPAROSCOPIC CHOLECYSTECTOMY WITH INTRAOPERATIVE CHOLANGIOGRAM;  Surgeon: Wilmon Arms. Tsuei, MD;  Location: [facility]S;  Service: General;  Laterality: N/A;   CORONARY ARTERY BYPASS GRAFT N/A 07/20/2018   Procedure: CORONARY ARTERY BYPASS GRAFTING (CABG) times three using left internal mammary artery to the LAD, and using endoscopically harvested right saphenous vein to PDA and intermedius.;  Surgeon: Delight Ovens, MD;  Location: [facility] OR;  Service: Open Heart Surgery;  Laterality: N/A;   LEFT HEART CATH AND CORONARY ANGIOGRAPHY N/A 07/15/2018  Procedure: LEFT HEART CATH AND CORONARY ANGIOGRAPHY;  Surgeon: Marykay Lex, MD;  Location: [facility] INVASIVE CV LAB;  Service: Cardiovascular;  Laterality: N/A;   RADIOACTIVE SEED IMPLANT N/A 06/21/2020   Procedure: RADIOACTIVE SEED IMPLANT/BRACHYTHERAPY IMPLANT;  Surgeon: Crist Fat, MD;  Location: [facility];  Service: Urology;  Laterality: N/A;   SHOULDER ARTHROSCOPY W/ ROTATOR CUFF REPAIR Right 09/03/2013   SHOULDER ARTHROSCOPY WITH SUBACROMIAL DECOMPRESSION, ROTATOR CUFF REPAIR AND BICEP TENDON REPAIR Left 09/03/2013   Procedure: LEFT SHOULDER ARTHROSCOPY WITH EXTENSIVE DEBRIDMENT,  DISTAL CLAVICULECTOMY, ROTATOR CUFF REPAIR AND SUBACROMIAL DECOMPRESSION PARTIAL ACRIOMIOPLASTY WITH CORACOACROMIAL RELEASE;  Surgeon: Sheral Apley, MD;  Location: [facility];  Service: Orthopedics;  Laterality: Left;   SPACE OAR INSTILLATION N/A 06/21/2020   Procedure: SPACE OAR INSTILLATION;  Surgeon: Crist Fat, MD;  Location: [facility];  Service: Urology;  Laterality: N/A;   TEE WITHOUT CARDIOVERSION N/A 07/20/2018   Procedure: TRANSESOPHAGEAL ECHOCARDIOGRAM (TEE);  Surgeon: Delight Ovens, MD;  Location: [facility] OR;  Service: Open Heart Surgery;  Laterality: N/A;   TRANSURETHRAL RESECTION OF BLADDER TUMOR WITH MITOMYCIN-C  08/24/2008   @[facility]      A IV Location/Drains/Wounds Patient Lines/Drains/Airways Status     Active Line/Drains/Airways     Name Placement date Placement time Site Days   Peripheral IV 03/13/23 20 G Anterior;Distal;Right;Upper Arm 03/13/23  1821  Arm  less than 1            Intake/Output Last 24 hours No intake or output data in the 24 hours ending 03/13/23 2327  Labs/Imaging Results for orders placed or performed during the hospital encounter of 03/13/23 (from the past 48 hour(s))  Basic metabolic panel     Status: Abnormal   Collection Time: 03/13/23  6:17 PM  Result Value Ref Range   Sodium 137 135 - 145 mmol/L   Potassium 4.2 3.5 - 5.1 mmol/L   Chloride 107 98 - 111 mmol/L   CO2 23 22 - 32 mmol/L   Glucose, Bld 184 (H) 70 - 99 mg/dL    Comment: Glucose reference range applies only to samples taken after fasting for at least 8 hours.   BUN 47 (H) 8 - 23 mg/dL   Creatinine, Ser 6.29 (H) 0.61 - 1.24 mg/dL   Calcium 9.2 8.9 - 52.8 mg/dL   GFR, Estimated 19 (L) >60 mL/min    Comment: (NOTE) Calculated using the CKD-EPI Creatinine Equation (2021)    Anion gap 7 5 - 15    Comment: Performed at [facility], [address] [place], [place] [address]  CBC     Status: Abnormal   Collection Time:  03/13/23  6:17 PM  Result Value Ref Range   WBC 2.9 (L) 4.0 - 10.5 K/uL   RBC 3.09 (L) 4.22 - 5.81 MIL/uL   Hemoglobin 10.0 (L) 13.0 - 17.0 g/dL   HCT 40.1 (L) 02.7 - 25.3 %   MCV 98.7 80.0 - 100.0 fL   MCH 32.4 26.0 - 34.0 pg   MCHC 32.8 30.0 - 36.0 g/dL   RDW 66.4 40.3 - 47.4 %   Platelets 95 (L) 150 - 400 K/uL    Comment: Immature Platelet Fraction may be clinically indicated, consider ordering this additional test [id number]    nRBC 0.0 0.0 - 0.2 %    Comment: Performed at [facility], [address] [place], [place] [address]  Troponin I (High Sensitivity)  Status: Abnormal   Collection Time: 03/13/23  6:17 PM  Result Value Ref Range   Troponin I (High Sensitivity) 27 (H) <18 ng/L    Comment: (NOTE) Elevated high sensitivity troponin I (hsTnI) values and significant  changes across serial measurements may suggest ACS but many other  chronic and acute conditions are known to elevate hsTnI results.  Refer to the "Links" section for chest pain algorithms and additional  guidance. Performed at [facility], [address], [place], [place] [address]   Brain natriuretic peptide     Status: Abnormal   Collection Time: 03/13/23  6:17 PM  Result Value Ref Range   B Natriuretic Peptide 2,123.1 (H) 0.0 - 100.0 pg/mL    Comment: Performed at [facility], [address] [place], [place] [address]  Troponin I (High Sensitivity)     Status: Abnormal   Collection Time: 03/13/23  8:34 PM  Result Value Ref Range   Troponin I (High Sensitivity) 31 (H) <18 ng/L    Comment: (NOTE) Elevated high sensitivity troponin I (hsTnI) values and significant  changes across serial measurements may suggest ACS but many other  chronic and acute conditions are known to elevate hsTnI results.  Refer to the "Links" section for chest pain algorithms and additional  guidance. Performed at [facility], [address], [place], [place] [address]    CT  Chest Wo Contrast  Result Date: 03/13/2023 CLINICAL DATA:  Respiratory illness with nondiagnostic x-ray. Suspect congestive heart failure. EXAM: CT CHEST WITHOUT CONTRAST TECHNIQUE: Multidetector CT imaging of the chest was performed following the standard protocol without IV contrast. RADIATION DOSE REDUCTION: This exam was performed according to the departmental dose-optimization program which includes automated exposure control, adjustment of the mA and/or kV according to patient size and/or use of iterative reconstruction technique. COMPARISON:  Chest radiograph 03/13/2023 FINDINGS: Cardiovascular: Normal heart size. No pericardial effusions. Postoperative changes consistent with coronary artery bypass. Normal caliber thoracic aorta. Calcification in the aorta and coronary arteries. Mediastinum/Nodes: Scattered lymph nodes without pathologic enlargement, likely reactive. Esophagus is decompressed. Thyroid gland is unremarkable. Lungs/Pleura: Emphysematous changes in the lungs. Vague mosaic attenuation pattern to the lungs is nonspecific but likely relates to air trapping or possibly edema. Small right pleural effusion with mild bilateral basilar atelectasis. Upper Abdomen: No acute abnormalities. Musculoskeletal: Degenerative changes in the spine. Sternotomy wires. IMPRESSION: 1. Emphysematous changes in the lungs. Mosaic attenuation pattern is likely related to air trapping in the setting of emphysema. Edema would be less likely. 2. Aortic atherosclerosis. Electronically Signed   By: Burman Nieves M.D.   On: 03/13/2023 20:33   DG Chest 2 View  Result Date: 03/13/2023 CLINICAL DATA:  Chest pain EXAM: CHEST - 2 VIEW COMPARISON:  07/15/2022 FINDINGS: Prior CABG. Heart and mediastinal contours are within normal limits. No focal opacities or effusions. No acute bony abnormality. IMPRESSION: No active cardiopulmonary disease. Electronically Signed   By: Charlett Nose M.D.   On: 03/13/2023 19:00    Pending  Labs Unresulted Labs (From admission, onward)    None       Vitals/Pain Today's Vitals   03/13/23 2100 03/13/23 2106 03/13/23 2200 03/13/23 2315  BP: (!) 174/67 (!) 174/67 (!) 151/67 (!) 131/97  Pulse: (!) 54  (!) 51 (!) 45  Resp: (!) 22  20 14   Temp:      TempSrc:      SpO2: 95%  95% 95%  Weight:  Height:      PainSc:        Isolation Precautions No active isolations  Medications Medications  furosemide (LASIX) injection 80 mg (80 mg Intravenous Given 03/13/23 2025)  hydrALAZINE (APRESOLINE) tablet 100 mg (100 mg Oral Given 03/13/23 2106)  cloNIDine (CATAPRES) tablet 0.1 mg (0.1 mg Oral Given 03/13/23 2106)    Mobility walks     Focused Assessments Cardiac Assessment Handoff:  Cardiac Rhythm: Sinus bradycardia No results found for: "CKTOTAL", "CKMB", "CKMBINDEX", "TROPONINI" No results found for: "DDIMER" Does the Patient currently have chest pain? No    R Recommendations: See Admitting Provider Note  Report given to:   Additional Notes:

## 2023-03-13 NOTE — ED Notes (Signed)
[facility] called for transport @23 :28

## 2023-03-14 ENCOUNTER — Inpatient Hospital Stay (HOSPITAL_COMMUNITY): Payer: Medicare Other

## 2023-03-14 ENCOUNTER — Encounter (HOSPITAL_COMMUNITY): Payer: Self-pay | Admitting: Internal Medicine

## 2023-03-14 ENCOUNTER — Other Ambulatory Visit: Payer: Self-pay

## 2023-03-14 DIAGNOSIS — N400 Enlarged prostate without lower urinary tract symptoms: Secondary | ICD-10-CM | POA: Diagnosis present

## 2023-03-14 DIAGNOSIS — I5021 Acute systolic (congestive) heart failure: Secondary | ICD-10-CM | POA: Diagnosis not present

## 2023-03-14 DIAGNOSIS — F32A Depression, unspecified: Secondary | ICD-10-CM | POA: Diagnosis present

## 2023-03-14 DIAGNOSIS — M109 Gout, unspecified: Secondary | ICD-10-CM | POA: Diagnosis present

## 2023-03-14 DIAGNOSIS — G4733 Obstructive sleep apnea (adult) (pediatric): Secondary | ICD-10-CM | POA: Diagnosis present

## 2023-03-14 DIAGNOSIS — J449 Chronic obstructive pulmonary disease, unspecified: Secondary | ICD-10-CM | POA: Diagnosis present

## 2023-03-14 DIAGNOSIS — I5033 Acute on chronic diastolic (congestive) heart failure: Secondary | ICD-10-CM

## 2023-03-14 LAB — COMPREHENSIVE METABOLIC PANEL
ALT: 20 U/L (ref 0–44)
AST: 24 U/L (ref 15–41)
Albumin: 3.6 g/dL (ref 3.5–5.0)
Alkaline Phosphatase: 63 U/L (ref 38–126)
Anion gap: 11 (ref 5–15)
BUN: 43 mg/dL — ABNORMAL HIGH (ref 8–23)
CO2: 24 mmol/L (ref 22–32)
Calcium: 9.5 mg/dL (ref 8.9–10.3)
Chloride: 104 mmol/L (ref 98–111)
Creatinine, Ser: 3.35 mg/dL — ABNORMAL HIGH (ref 0.61–1.24)
GFR, Estimated: 19 mL/min — ABNORMAL LOW (ref >60)
Glucose, Bld: 116 mg/dL — ABNORMAL HIGH (ref 70–99)
Potassium: 3.6 mmol/L (ref 3.5–5.1)
Sodium: 139 mmol/L (ref 135–145)
Total Bilirubin: 1.4 mg/dL — ABNORMAL HIGH (ref 0.3–1.2)
Total Protein: 6.9 g/dL (ref 6.5–8.1)

## 2023-03-14 LAB — CBC WITH DIFFERENTIAL/PLATELET
Abs Immature Granulocytes: 0.01 10*3/uL (ref 0.00–0.07)
Basophils Absolute: 0 10*3/uL (ref 0.0–0.1)
Basophils Relative: 1 %
Eosinophils Absolute: 0.1 10*3/uL (ref 0.0–0.5)
Eosinophils Relative: 3 %
HCT: 33.4 % — ABNORMAL LOW (ref 39.0–52.0)
Hemoglobin: 11 g/dL — ABNORMAL LOW (ref 13.0–17.0)
Immature Granulocytes: 0 %
Lymphocytes Relative: 23 %
Lymphs Abs: 1 10*3/uL (ref 0.7–4.0)
MCH: 32.4 pg (ref 26.0–34.0)
MCHC: 32.9 g/dL (ref 30.0–36.0)
MCV: 98.5 fL (ref 80.0–100.0)
Monocytes Absolute: 0.5 10*3/uL (ref 0.1–1.0)
Monocytes Relative: 10 %
Neutro Abs: 2.8 10*3/uL (ref 1.7–7.7)
Neutrophils Relative %: 63 %
Platelets: 123 10*3/uL — ABNORMAL LOW (ref 150–400)
RBC: 3.39 MIL/uL — ABNORMAL LOW (ref 4.22–5.81)
RDW: 15.2 % (ref 11.5–15.5)
WBC: 4.4 10*3/uL (ref 4.0–10.5)
nRBC: 0 % (ref 0.0–0.2)

## 2023-03-14 LAB — ECHOCARDIOGRAM COMPLETE
AR max vel: 3.06 cm2
AV Peak grad: 11.6 mmHg
Ao pk vel: 1.7 m/s
Area-P 1/2: 3.31 cm2
Height: 66 in
S' Lateral: 3.9 cm
Weight: 3241.64 oz

## 2023-03-14 LAB — MAGNESIUM: Magnesium: 2.4 mg/dL (ref 1.7–2.4)

## 2023-03-14 LAB — GLUCOSE, CAPILLARY
Glucose-Capillary: 218 mg/dL — ABNORMAL HIGH (ref 70–99)
Glucose-Capillary: 230 mg/dL — ABNORMAL HIGH (ref 70–99)
Glucose-Capillary: 102 mg/dL — ABNORMAL HIGH (ref 70–99)
Glucose-Capillary: 129 mg/dL — ABNORMAL HIGH (ref 70–99)

## 2023-03-14 MED ORDER — SERTRALINE HCL 100 MG PO TABS
100.0000 mg | ORAL_TABLET | Freq: Every day | ORAL | Status: DC
  Administered 2023-03-14 – 2023-03-19 (×6): 100 mg via ORAL
  Filled 2023-03-14 (×6): qty 1

## 2023-03-14 MED ORDER — ISOSORBIDE MONONITRATE ER 60 MG PO TB24
120.0000 mg | ORAL_TABLET | Freq: Every day | ORAL | Status: DC
  Administered 2023-03-14 – 2023-03-19 (×6): 120 mg via ORAL
  Filled 2023-03-14 (×6): qty 2

## 2023-03-14 MED ORDER — CLONIDINE HCL 0.1 MG PO TABS
0.1000 mg | ORAL_TABLET | Freq: Two times a day (BID) | ORAL | Status: DC
  Administered 2023-03-14 – 2023-03-18 (×9): 0.1 mg via ORAL
  Filled 2023-03-14 (×9): qty 1

## 2023-03-14 MED ORDER — INSULIN PUMP
Freq: Three times a day (TID) | SUBCUTANEOUS | Status: DC
  Filled 2023-03-14: qty 1

## 2023-03-14 MED ORDER — ACETAMINOPHEN 650 MG RE SUPP: 650.0000 mg | Freq: Four times a day (QID) | RECTAL | Status: DC | PRN

## 2023-03-14 MED ORDER — POLYETHYLENE GLYCOL 3350 17 G PO PACK: 17.0000 g | PACK | Freq: Every day | ORAL | Status: DC | PRN

## 2023-03-14 MED ORDER — ALBUTEROL SULFATE (2.5 MG/3ML) 0.083% IN NEBU: 2.5000 mg | INHALATION_SOLUTION | RESPIRATORY_TRACT | Status: DC | PRN

## 2023-03-14 MED ORDER — FLUTICASONE PROPIONATE 50 MCG/ACT NA SUSP
2.0000 | Freq: Every day | NASAL | Status: DC
  Administered 2023-03-14 – 2023-03-18 (×5): 2 via NASAL
  Filled 2023-03-14: qty 16

## 2023-03-14 MED ORDER — SODIUM CHLORIDE 0.9% FLUSH
3.0000 mL | Freq: Two times a day (BID) | INTRAVENOUS | Status: DC
  Administered 2023-03-14 – 2023-03-18 (×8): 3 mL via INTRAVENOUS

## 2023-03-14 MED ORDER — ATORVASTATIN CALCIUM 40 MG PO TABS
40.0000 mg | ORAL_TABLET | Freq: Every day | ORAL | Status: DC
  Administered 2023-03-14 – 2023-03-19 (×6): 40 mg via ORAL
  Filled 2023-03-14 (×6): qty 1

## 2023-03-14 MED ORDER — METOPROLOL TARTRATE 50 MG PO TABS
50.0000 mg | ORAL_TABLET | Freq: Two times a day (BID) | ORAL | Status: DC
  Administered 2023-03-14 – 2023-03-17 (×8): 50 mg via ORAL
  Filled 2023-03-14 (×9): qty 1

## 2023-03-14 MED ORDER — ENOXAPARIN SODIUM 30 MG/0.3ML IJ SOSY
30.0000 mg | PREFILLED_SYRINGE | INTRAMUSCULAR | Status: DC
  Administered 2023-03-14 – 2023-03-18 (×5): 30 mg via SUBCUTANEOUS
  Filled 2023-03-14 (×5): qty 0.3

## 2023-03-14 MED ORDER — ASPIRIN 81 MG PO TBEC
81.0000 mg | DELAYED_RELEASE_TABLET | Freq: Every day | ORAL | Status: DC
  Administered 2023-03-14 – 2023-03-19 (×6): 81 mg via ORAL
  Filled 2023-03-14 (×6): qty 1

## 2023-03-14 MED ORDER — FUROSEMIDE 10 MG/ML IJ SOLN
60.0000 mg | Freq: Two times a day (BID) | INTRAMUSCULAR | Status: DC
  Administered 2023-03-14 – 2023-03-15 (×3): 60 mg via INTRAVENOUS
  Filled 2023-03-14 (×3): qty 6

## 2023-03-14 MED ORDER — BISACODYL 5 MG PO TBEC: 5.0000 mg | DELAYED_RELEASE_TABLET | Freq: Every day | ORAL | Status: DC | PRN

## 2023-03-14 MED ORDER — DOCUSATE SODIUM 100 MG PO CAPS
100.0000 mg | ORAL_CAPSULE | Freq: Two times a day (BID) | ORAL | Status: DC
  Administered 2023-03-14 – 2023-03-18 (×8): 100 mg via ORAL
  Filled 2023-03-14 (×12): qty 1

## 2023-03-14 MED ORDER — MELATONIN 3 MG PO TABS: 3.0000 mg | ORAL_TABLET | Freq: Every evening | ORAL | Status: DC | PRN

## 2023-03-14 MED ORDER — IPRATROPIUM-ALBUTEROL 0.5-2.5 (3) MG/3ML IN SOLN: 3.0000 mL | Freq: Four times a day (QID) | RESPIRATORY_TRACT | Status: DC

## 2023-03-14 MED ORDER — TAMSULOSIN HCL 0.4 MG PO CAPS
0.4000 mg | ORAL_CAPSULE | Freq: Every day | ORAL | Status: DC
  Administered 2023-03-14 – 2023-03-18 (×5): 0.4 mg via ORAL
  Filled 2023-03-14 (×5): qty 1

## 2023-03-14 MED ORDER — ALLOPURINOL 100 MG PO TABS
200.0000 mg | ORAL_TABLET | Freq: Every day | ORAL | Status: DC
  Administered 2023-03-14 – 2023-03-19 (×6): 200 mg via ORAL
  Filled 2023-03-14 (×6): qty 2

## 2023-03-14 MED ORDER — ONDANSETRON HCL 4 MG/2ML IJ SOLN: 4.0000 mg | Freq: Four times a day (QID) | INTRAMUSCULAR | Status: DC | PRN

## 2023-03-14 MED ORDER — HYDRALAZINE HCL 50 MG PO TABS
100.0000 mg | ORAL_TABLET | Freq: Three times a day (TID) | ORAL | Status: DC
  Administered 2023-03-14 – 2023-03-19 (×16): 100 mg via ORAL
  Filled 2023-03-14 (×18): qty 2

## 2023-03-14 MED ORDER — INSULIN ASPART 100 UNIT/ML IJ SOLN
0.0000 [IU] | Freq: Three times a day (TID) | INTRAMUSCULAR | Status: DC
  Administered 2023-03-14: 5 [IU] via SUBCUTANEOUS

## 2023-03-14 MED ORDER — INSULIN ASPART 100 UNIT/ML IJ SOLN: 0.0000 [IU] | Freq: Every day | INTRAMUSCULAR | Status: DC

## 2023-03-14 MED ORDER — IPRATROPIUM-ALBUTEROL 0.5-2.5 (3) MG/3ML IN SOLN: 3.0000 mL | Freq: Four times a day (QID) | RESPIRATORY_TRACT | Status: DC | PRN

## 2023-03-14 MED ORDER — ACETAMINOPHEN 325 MG PO TABS
650.0000 mg | ORAL_TABLET | Freq: Four times a day (QID) | ORAL | Status: DC | PRN
  Administered 2023-03-14 – 2023-03-19 (×2): 650 mg via ORAL
  Filled 2023-03-14 (×3): qty 2

## 2023-03-14 MED ORDER — LATANOPROST 0.005 % OP SOLN
1.0000 [drp] | Freq: Every day | OPHTHALMIC | Status: DC
  Administered 2023-03-14 – 2023-03-18 (×5): 1 [drp] via OPHTHALMIC
  Filled 2023-03-14: qty 2.5

## 2023-03-14 MED ORDER — HYDRALAZINE HCL 20 MG/ML IJ SOLN
5.0000 mg | INTRAMUSCULAR | Status: DC | PRN
  Filled 2023-03-14: qty 1

## 2023-03-14 NOTE — Inpatient Diabetes Management (Signed)
Inpatient Diabetes Program Recommendations  AACE/ADA: New Consensus Statement on Inpatient Glycemic Control (2015)  Target Ranges:  Prepandial:   less than 140 mg/dL      Peak postprandial:   less than 180 mg/dL (1-2 hours)      Critically ill patients:  140 - 180 mg/dL   Lab Results  Component Value Date   GLUCAP 218 (H) 03/14/2023   HGBA1C 6.6 (H) 02/06/2023    Diabetes history: DM2 Outpatient Diabetes medications: T Slim insulin pump Current orders for Inpatient glycemic control: Novolog 0-15 units tid, 0-5 units hs  Inpatient Diabetes Program Recommendations:   Patient sees Dr. Lucianne Muss for endocrinology with last office visit 02/13/23. "INSULIN regimen  on T-Slim pump:     BASAL rate settings:   Midnight = 2.0, 3 AM = 1.8, 8 AM = 2.4, 4 PM = 2.7, 7 PM = 3.0 and 10 PM = 2.2   Boluses 3-9 units before meals with coverage of 1: 10   Correction factor I: 50 except 1: 150 at 3 AM and 1: 60 at 8 AM"  Spoke with patient and wife @ bedside. Wife is leaving now to get supplies, charger and insulin for patient's insulin pump.  Discussed with Dr. Ophelia Charter and ok to restart insulin pump when wife arrives back with supplies.  Patient requested information about the G7 Dexcom sensor and will provide sample to patient to take to Dr. Remus Blake office to transition to G7.  Thank you, Billy Fischer. Rachel Samples, RN, MSN, CDE  Diabetes Coordinator Inpatient Glycemic Control Team Team Pager [phone] (8am-5pm) 03/14/2023 10:50 AM

## 2023-03-14 NOTE — Care Management CC44 (Signed)
Condition Code 44 Documentation Completed  Patient Details  Name: Alec [name] MRN: [id number] Date of Birth: [birth year]   Condition Code 44 given:  Yes Patient signature on Condition Code 44 notice:  Yes Documentation of 2 MD's agreement:  Yes Code 44 added to claim:  Yes    Gala Lewandowsky, RN 03/14/2023, 5:09 PM

## 2023-03-14 NOTE — Evaluation (Signed)
Physical Therapy Evaluation Patient Details Name: Alec [name] MRN: [id number] DOB: 10-21-1945 Today's Date: 03/14/2023  History of Present Illness  The pt is a 77 yo male presenting 6/13 with SOB, worse on exertion for 2 months. Work up showed elevated creatinine and BNP, likely CHF exacerbation. PMH includes: HTN, HLD, CAD s/p CABG x3, DM II, CHF, prostate cancer, CKD IV.   Clinical Impression  Pt in bed upon arrival of PT, agreeable to evaluation at this time. Prior to admission the pt was completely independent with mobility, until recently had been able to golf and go on walks with his wife. The pt now presents with limitations in functional mobility and endurance due to above dx, and will continue to benefit from skilled PT to address these deficits. The pt was able to complete all transfers and hallway ambulation without need for assistance or DME, but demos onset of SOB and fatigue after ~250 ft ambulation. SpO2 >93% with all mobility on RA, and pt able to demo good self-regulation and need for rest. Pt will benefit from continued skilled progression of activity and interval training to improve endurance so that pt can return to the activities he enjoys.        Recommendations for follow up therapy are one component of a multi-disciplinary discharge planning process, led by the attending physician.  Recommendations may be updated based on patient status, additional functional criteria and insurance authorization.  Follow Up Recommendations       Assistance Recommended at Discharge PRN  Patient can return home with the following  Help with stairs or ramp for entrance    Equipment Recommendations None recommended by PT  Recommendations for Other Services       Functional Status Assessment Patient has not had a recent decline in their functional status     Precautions / Restrictions Precautions Precautions: None Restrictions Weight Bearing Restrictions: No      Mobility   Bed Mobility Overal bed mobility: Independent                  Transfers Overall transfer level: Independent Equipment used: None                    Ambulation/Gait Ambulation/Gait assistance: Supervision Gait Distance (Feet): 250 Feet (+ 75) Assistive device: None Gait Pattern/deviations: WFL(Within Functional Limits) Gait velocity: 0.75 m/s Gait velocity interpretation: >2.62 ft/sec, indicative of community ambulatory   General Gait Details: pt asking for standing rest break after 250 ft. SpO2 93% on RA. then returned to room.     Balance Overall balance assessment: No apparent balance deficits (not formally assessed)                                           Pertinent Vitals/Pain Pain Assessment Pain Assessment: No/denies pain    Home Living Family/patient expects to be discharged to:: Private residence Living Arrangements: Spouse/significant other Available Help at Discharge: Family;Available 24 hours/day Type of Home: House Home Access: Stairs to enter Entrance Stairs-Rails: None Entrance Stairs-Number of Steps: 2   Home Layout: One level Home Equipment: Agricultural consultant (2 wheels);Rollator (4 wheels)      Prior Function Prior Level of Function : Independent/Modified Independent;Working/employed;Driving             Mobility Comments: independent, SOB walking .25 mile, no falls ADLs Comments: independent, workign part time delivering auto  parts     Hand Dominance   Dominant Hand: Left    Extremity/Trunk Assessment   Upper Extremity Assessment Upper Extremity Assessment: Overall WFL for tasks assessed    Lower Extremity Assessment Lower Extremity Assessment: Overall WFL for tasks assessed    Cervical / Trunk Assessment Cervical / Trunk Assessment: Normal  Communication   Communication: No difficulties  Cognition Arousal/Alertness: Awake/alert Behavior During Therapy: WFL for tasks assessed/performed Overall  Cognitive Status: Within Functional Limits for tasks assessed                                          General Comments General comments (skin integrity, edema, etc.): VSS on RA with all mobility. Spo2 low of 93%. pt does report some SOB after 250 ft ambulation (SpO2 93-94%)    Exercises     Assessment/Plan    PT Assessment Patient needs continued PT services  PT Problem List Cardiopulmonary status limiting activity       PT Treatment Interventions Therapeutic exercise    PT Goals (Current goals can be found in the Care Plan section)  Acute Rehab PT Goals Patient Stated Goal: return to golfing PT Goal Formulation: With patient Time For Goal Achievement: 03/28/23 Potential to Achieve Goals: Good    Frequency Min 1X/week        AM-PAC PT "6 Clicks" Mobility  Outcome Measure Help needed turning from your back to your side while in a flat bed without using bedrails?: None Help needed moving from lying on your back to sitting on the side of a flat bed without using bedrails?: None Help needed moving to and from a bed to a chair (including a wheelchair)?: None Help needed standing up from a chair using your arms (e.g., wheelchair or bedside chair)?: None Help needed to walk in hospital room?: None Help needed climbing 3-5 steps with a railing? : A Little 6 Click Score: 23    End of Session   Activity Tolerance: Patient tolerated treatment well Patient left: in bed;with call bell/phone within reach;with family/visitor present Nurse Communication: Mobility status PT Visit Diagnosis: Other abnormalities of gait and mobility (R26.89)    Time: 1610-9604 PT Time Calculation (min) (ACUTE ONLY): 28 min   Charges:   PT Evaluation $PT Eval Low Complexity: 1 Low PT Treatments $Therapeutic Exercise: 8-22 mins        Vickki Muff, PT, DPT   Acute Rehabilitation Department Office [phone] Secure Chat Communication Preferred  [name] Derby 03/14/2023,  1:23 PM

## 2023-03-14 NOTE — Progress Notes (Signed)
  Echocardiogram 2D Echocardiogram has been performed.  Lucendia Herrlich 03/14/2023, 2:09 PM

## 2023-03-14 NOTE — H&P (Addendum)
History and Physical    Patient: Alec [name] WUJ:[id number] DOB: January 23, 1946 DOA: 03/13/2023 DOS: the patient was seen and examined on 03/14/2023 PCP: Dagoberto Ligas, MD  Patient coming from: Home - lives with wife; NOK: Wife, [phone]   Chief Complaint: SOB  HPI: Alec [name] is a 77 y.o. male with medical history significant of CAD s/p CABG, stage 4 CKD, remote bladder/prostate cancer, chronic diastolic CHF, HTN, HLD, DM, OSA, and RA presenting with SOB.   He reports that he couldn't breathe, getting worse over the last few months.  No cough.  +wheezing.  He feels like there may be some post-nasal drip.  No edema other than mild ankle edema this AM.  SOB was all the time.  He went to [facility] and was given steroids for the wheezing.  Steroids made it better but it did not resolve.  This was maybe 6+ weeks ago.  +orthopnea.  +DOE.  +PND.  +chest tightness.      ER Course:  Carryover, per Dr. Margo Aye:  2 months of progressive exertional dyspnea.  Associated with intermittent chest discomfort for the past 2 weeks.  Prescribed steroids about a month ago for wheezing, which improved his symptoms.   In the ED, workup revealed elevated BNP greater than 2000 with concern for acute on chronic diastolic CHF, mildly elevated high-sensitivity troponin, 27, repeat 31, likely from demand ischemia with uncontrolled hypertension.  Received 80 mg of IV Lasix.  His home oral antihypertensives were restarted.  Not hypoxic with O2 saturation of 93 to 95% breathing ambient air.  CT chest shows emphysema, former smoker, quit in 2018.      Review of Systems: As mentioned in the history of present illness. All other systems reviewed and are negative. Past Medical History:  Diagnosis Date   Anemia    Anxiety    Aortic insufficiency    Arthritis    Bilateral lower extremity edema    Chronic gout    06-19-2020  per pt last episode 6 months , great toe   CKD (chronic kidney disease), stage IV [facility])     Coronary artery disease cardiologist--- dr hochrein   CABG 2019   Degenerative arthritis of shoulder region 08/2013   left   History of bladder cancer 07/2008   s/p  TURBT   History of cellulitis 11/2017   left upper arm   History of kidney stones    Hyperlipidemia    Hypertension    followed by pcp/ cardiology   IDDM (insulin dependent diabetes mellitus)    endocrinologist--- dr Lucianne Muss---  pt uses insulin pump and dexcom  (06-19-2020 per pt fasting sugar-- 98--110)   Insulin pump in place    Mitral regurgitation    OSA (obstructive sleep apnea)    Prostate cancer [facility]) urologist--- dr herrick/  oncologist--- [place]   dx 11/ 2017  Gleason 3+3 active survillance;  bx 03-14-2020 Gleason 3+4   RA (rheumatoid arthritis) (HCC)    rhemotologist--- dr Marylyn Ishihara---     S/P CABG x 3 07/15/2018   LIMA to LAD;  SVG to PDA;  SVG to RI   Thrombocytopenia [facility])    Past Surgical History:  Procedure Laterality Date   CHOLECYSTECTOMY  08/29/2012   Procedure: LAPAROSCOPIC CHOLECYSTECTOMY WITH INTRAOPERATIVE CHOLANGIOGRAM;  Surgeon: Wilmon Arms. Corliss Skains, MD;  Location: [facility] ORS;  Service: General;  Laterality: N/A;   CORONARY ARTERY BYPASS GRAFT N/A 07/20/2018   Procedure: CORONARY ARTERY BYPASS GRAFTING (CABG) times three using left internal mammary  artery to the LAD, and using endoscopically harvested right saphenous vein to PDA and intermedius.;  Surgeon: Delight Ovens, MD;  Location: [facility] OR;  Service: Open Heart Surgery;  Laterality: N/A;   LEFT HEART CATH AND CORONARY ANGIOGRAPHY N/A 07/15/2018   Procedure: LEFT HEART CATH AND CORONARY ANGIOGRAPHY;  Surgeon: Marykay Lex, MD;  Location: [facility] INVASIVE CV LAB;  Service: Cardiovascular;  Laterality: N/A;   RADIOACTIVE SEED IMPLANT N/A 06/21/2020   Procedure: RADIOACTIVE SEED IMPLANT/BRACHYTHERAPY IMPLANT;  Surgeon: Crist Fat, MD;  Location: [facility];  Service: Urology;  Laterality: N/A;   SHOULDER ARTHROSCOPY W/  ROTATOR CUFF REPAIR Right 09/03/2013   SHOULDER ARTHROSCOPY WITH SUBACROMIAL DECOMPRESSION, ROTATOR CUFF REPAIR AND BICEP TENDON REPAIR Left 09/03/2013   Procedure: LEFT SHOULDER ARTHROSCOPY WITH EXTENSIVE DEBRIDMENT, DISTAL CLAVICULECTOMY, ROTATOR CUFF REPAIR AND SUBACROMIAL DECOMPRESSION PARTIAL ACRIOMIOPLASTY WITH CORACOACROMIAL RELEASE;  Surgeon: Sheral Apley, MD;  Location: [facility];  Service: Orthopedics;  Laterality: Left;   SPACE OAR INSTILLATION N/A 06/21/2020   Procedure: SPACE OAR INSTILLATION;  Surgeon: Crist Fat, MD;  Location: [facility];  Service: Urology;  Laterality: N/A;   TEE WITHOUT CARDIOVERSION N/A 07/20/2018   Procedure: TRANSESOPHAGEAL ECHOCARDIOGRAM (TEE);  Surgeon: Delight Ovens, MD;  Location: [facility] OR;  Service: Open Heart Surgery;  Laterality: N/A;   TRANSURETHRAL RESECTION OF BLADDER TUMOR WITH MITOMYCIN-C  08/24/2008   @[facility]    Social History:  reports that he quit smoking about 25 years ago. His smoking use included cigarettes. He has a 50.00 pack-year smoking history. He has never used smokeless tobacco. He reports that he does not currently use alcohol. He reports that he does not use drugs.  Allergies  Allergen Reactions   Nsaids Other (See Comments)    Told to avoid by dr     Family History  Problem Relation Age of Onset   Diabetes Mother    Lung cancer Mother    Cancer Father    Coronary artery disease Father        MI at age 54.    Lung cancer Father    Sudden death Son    CAD Maternal Grandfather        MI in his 58's   CAD Paternal Grandfather    Breast cancer Neg Hx    Colon cancer Neg Hx    Pancreatic cancer Neg Hx     Prior to Admission medications   Medication Sig Start Date End Date Taking? Authorizing Provider  allopurinol (ZYLOPRIM) 100 MG tablet Take 200 mg by mouth daily.     [provider]  aspirin EC 81 MG tablet Take 81 mg by mouth daily.    [provider]   atorvastatin (LIPITOR) 40 MG tablet Take 1 tablet (40 mg total) by mouth daily. 05/21/22   Rollene Rotunda, MD  azithromycin (ZITHROMAX) 250 MG tablet Take 1 tablet (250 mg total) by mouth daily. Take first 2 tablets together, then 1 every day until finished. Patient not taking: Reported on 02/13/2023 12/04/22   Valinda Hoar, NP  benzonatate (TESSALON) 100 MG capsule Take 1 capsule (100 mg total) by mouth every 8 (eight) hours. Patient not taking: Reported on 02/13/2023 12/04/22   Valinda Hoar, NP  Blood Glucose Monitoring Suppl (FREESTYLE FREEDOM LITE) w/Device KIT Use to check blood sugar three times daily. DX:E11.65 Patient not taking: Reported on 02/27/2023 02/02/20   Reather Littler, MD  Boswellia-Glucosamine-Vit D (OSTEO BI-FLEX ONE PER DAY  PO) Take 1 tablet by mouth daily.    [provider]  Cholecalciferol (VITAMIN D3 PO) Take 1 capsule by mouth daily.    [provider]  CINNAMON PO Take 500 mg by mouth daily.    [provider]  cloNIDine (CATAPRES) 0.1 MG tablet Take 1 tablet (0.1 mg total) by mouth 2 (two) times daily. 05/31/22   Rollene Rotunda, MD  furosemide (LASIX) 20 MG tablet Take 1 tablet (20 mg total) by mouth daily. 09/05/22   Rollene Rotunda, MD  hydrALAZINE (APRESOLINE) 100 MG tablet Take 1 tablet (100 mg total) by mouth 3 (three) times daily. 03/29/22   Vassie Loll, MD  isosorbide mononitrate (IMDUR) 120 MG 24 hr tablet TAKE 1 TABLET BY MOUTH EVERY DAY Patient not taking: Reported on 02/27/2023 12/25/22   Rollene Rotunda, MD  latanoprost (XALATAN) 0.005 % ophthalmic solution Place 1 drop into both eyes at bedtime. 09/18/21   [provider]  metoprolol tartrate (LOPRESSOR) 50 MG tablet Take 1 tablet (50 mg total) by mouth 2 (two) times daily. 05/13/22   Rollene Rotunda, MD  Misc Natural Products (TART CHERRY ADVANCED PO) Take 1 tablet by mouth daily.    [provider]  Misc Natural Products (TART CHERRY ADVANCED) CAPS Take 1,000 mg by  mouth daily in the afternoon.    [provider]  Multiple Vitamins-Minerals (CENTRUM SILVER PO) Take 1 tablet by mouth daily.    [provider]  nitroGLYCERIN (NITROSTAT) 0.4 MG SL tablet Place 1 tablet (0.4 mg total) under the tongue every 5 (five) minutes as needed for chest pain. 10/26/21 09/05/22  Rollene Rotunda, MD  NOVOLOG 100 UNIT/ML injection USE MAX 85 UNITS DAILY VIA INSULIN PUMP 01/07/23   Reather Littler, MD  olmesartan (BENICAR) 5 MG tablet Take 5 mg by mouth daily. 06/15/22   [provider]  sertraline (ZOLOFT) 100 MG tablet Take 1 tablet (100 mg total) by mouth daily. 05/31/22   Rollene Rotunda, MD  tamsulosin (FLOMAX) 0.4 MG CAPS capsule Take 0.4 mg by mouth at bedtime.    [provider]    Physical Exam: Vitals:   03/14/23 0327 03/14/23 0409 03/14/23 0429 03/14/23 0739  BP: (!) 184/67  (!) 143/63 (!) 175/58  Pulse: (!) 50  (!) 50 60  Resp: 16  16 16   Temp: 97.9 F (36.6 C)   97.8 F (36.6 C)  TempSrc: Oral   Oral  SpO2: 97%  98% 96%  Weight:  91.9 kg    Height:       General:  Appears calm and comfortable and is in NAD, on RA Eyes:  EOMI, normal lids, iris ENT:  grossly normal hearing, lips & tongue, mmm Neck:  no LAD, masses or thyromegaly Cardiovascular:  RR with mild bradycardia, no m/r/g. No LE edema.  Respiratory:   CTA bilaterally with no wheezes/rales/rhonchi.  Normal respiratory effort. Abdomen:  soft, NT, ND Back:   normal alignment, no CVAT Skin:  no rash or induration seen on limited exam Musculoskeletal:  grossly normal tone BUE/BLE, good ROM, no bony abnormality Lower extremity:  No LE edema.  Limited foot exam with no ulcerations.  2+ distal pulses. Psychiatric:  grossly normal mood and affect, speech fluent and appropriate, AOx3 Neurologic:  CN 2-12 grossly intact, moves all extremities in coordinated fashion   Radiological Exams on Admission: Independently reviewed - see discussion in A/P where applicable  CT  Chest Wo Contrast  Result Date: 03/13/2023 CLINICAL DATA:  Respiratory illness with  nondiagnostic x-ray. Suspect congestive heart failure. EXAM: CT CHEST WITHOUT CONTRAST TECHNIQUE: Multidetector CT imaging of the chest was performed following the standard protocol without IV contrast. RADIATION DOSE REDUCTION: This exam was performed according to the departmental dose-optimization program which includes automated exposure control, adjustment of the mA and/or kV according to patient size and/or use of iterative reconstruction technique. COMPARISON:  Chest radiograph 03/13/2023 FINDINGS: Cardiovascular: Normal heart size. No pericardial effusions. Postoperative changes consistent with coronary artery bypass. Normal caliber thoracic aorta. Calcification in the aorta and coronary arteries. Mediastinum/Nodes: Scattered lymph nodes without pathologic enlargement, likely reactive. Esophagus is decompressed. Thyroid gland is unremarkable. Lungs/Pleura: Emphysematous changes in the lungs. Vague mosaic attenuation pattern to the lungs is nonspecific but likely relates to air trapping or possibly edema. Small right pleural effusion with mild bilateral basilar atelectasis. Upper Abdomen: No acute abnormalities. Musculoskeletal: Degenerative changes in the spine. Sternotomy wires. IMPRESSION: 1. Emphysematous changes in the lungs. Mosaic attenuation pattern is likely related to air trapping in the setting of emphysema. Edema would be less likely. 2. Aortic atherosclerosis. Electronically Signed   By: Burman Nieves M.D.   On: 03/13/2023 20:33   DG Chest 2 View  Result Date: 03/13/2023 CLINICAL DATA:  Chest pain EXAM: CHEST - 2 VIEW COMPARISON:  07/15/2022 FINDINGS: Prior CABG. Heart and mediastinal contours are within normal limits. No focal opacities or effusions. No acute bony abnormality. IMPRESSION: No active cardiopulmonary disease. Electronically Signed   By: Charlett Nose M.D.   On: 03/13/2023 19:00    EKG:  Independently reviewed.  NSR with rate 64; nonspecific ST changes with no evidence of acute ischemia   Labs on Admission: I have personally reviewed the available labs and imaging studies at the time of the admission.  Pertinent labs:    Glucose 116 BUN 43/Creatinine 3.35/GFR 19 - stable BNP 2123.1; 778.4 on 07/15/22 HS troponin 27, 31 WBC 4.4 Hgb 11 Platelets 123 A1c 6.6   Assessment and Plan: Principal Problem:   Acute on chronic diastolic (congestive) heart failure (HCC) Active Problems:   Type 2 diabetes mellitus with chronic kidney disease, with long-term current use of insulin (HCC)   Dyslipidemia, goal LDL below 70   Essential hypertension   S/P CABG x 3   CKD (chronic kidney disease), stage IV (HCC)   COPD (chronic obstructive pulmonary disease) (HCC)   Gout   OSA (obstructive sleep apnea)   Depression   BPH (benign prostatic hyperplasia)    Acute on chronic diastolic CHF -Patient with known h/o chronic diastolic CHF presenting with worsening SOB -CXR unremarkable, chest CT with apparent COPD with air trapping -Elevated BNP compared to baseline -With elevated BNP, acute decompensated CHF is a consideration - vs. Progressive COPD  -Will admit to telemetry -Will request echocardiogram -Continue ASA -CHF order set utilized; may need CHF team consult but will hold until Echo results are available -Was given Lasix 80 mg x 1 in ER and will repeat with 60 mg IV BID -Continue Ericson O2 for now  COPD -Significant prior smoking history without apparent diagnosis of COPD -Chest CT with apparent COPD -No obvious exacerbation at this time -Will start standing Duonebs, prn Albuterol  HTN -Continue clonidine, hydralazine, metoprolol -Hold olmesartan -Will also add prn hydralazine  HLD -Continue Lipitor  DM -Recent A1c was 6.6, good control -He is on an insulin pump at home but it is currently dead and needs to be changed out -He prefers to change to insulin  injections for now -Will cover  with SSI, hold glargine for now -DM coordinator consult  CAD -s/p CABG -Mild chest tightness without apparent ACS -Mildly elevated troponin with negative delta -Continue ASA, Imdur  Stage 4 CKD -Some concern for cardiorenal syndrome although renal function appears stable at this time -Hold ARB -Renal function may worsen with diuresis -Continue to follow BMP  Gout -Continue allopurinol  OSA -Start CPAP  Glaucoma -Continue latanoprost  Depression -Continue sertraline  BPH -Continue tamsulosin      Advance Care Planning:   Code Status: Full Code - Code status was discussed with the patient and/or family at the time of admission.  The patient would want to receive full resuscitative measures at this time.   Consults: CHF navigator; DM coordinator; [facility] team; nutrition; PT/OT  DVT Prophylaxis: Lovenox  Family Communication: Wife was present throughout evaluation  Severity of Illness: The appropriate patient status for this patient is INPATIENT. Inpatient status is judged to be reasonable and necessary in order to provide the required intensity of service to ensure the patient's safety. The patient's presenting symptoms, physical exam findings, and initial radiographic and laboratory data in the context of their chronic comorbidities is felt to place them at high risk for further clinical deterioration. Furthermore, it is not anticipated that the patient will be medically stable for discharge from the hospital within 2 midnights of admission.   * I certify that at the point of admission it is my clinical judgment that the patient will require inpatient hospital care spanning beyond 2 midnights from the point of admission due to high intensity of service, high risk for further deterioration and high frequency of surveillance required.*   Author: Jonah Blue, MD 03/14/2023 10:34 AM  For on call review [email or web address].

## 2023-03-14 NOTE — ED Notes (Signed)
[facility] at bedside 

## 2023-03-14 NOTE — Progress Notes (Signed)
(  Carryover admission to the Day Admitter; accepted by Dr.  Margo Aye as transfer from  [facility]  to a  cardiac-tele bed at  [facility]  for acute on chronic diastolic heart failure. Please see Dr. Scharlene Gloss transfer documentation in [facility] communication for additional details).   I have placed some additional preliminary admit orders via the adult multi-morbid admission order set. I have also ordered additional IV Lasix in the form of 60 mg IV twice daily.  Also ordered morning labs in the form of CMP, CBC, and serum magnesium level.    Alec Pigg, DO Hospitalist

## 2023-03-14 NOTE — Progress Notes (Signed)
Nutrition Brief Note  Consult received for nutrition goals.   Patient is being followed by outpatient [facility] Dietitian. Last seen ~2 weeks ago and is working on diabetes management. He reports eating well and has been working on controlled portions and increasing fruits and vegetables in his diet. He has noticed that he is having less low and high blood sugars.   Dietary recall includes: Breakfast- Egg biscuit from [employer] Lunch: [place] or roast beef sandwich Dinner: Development worker, community with vegetables  Wt Readings from Last 15 Encounters:  03/14/23 91.9 kg  02/27/23 93.4 kg  02/13/23 92.8 kg  10/22/22 90.3 kg  10/10/22 90.4 kg  10/10/22 90.4 kg  09/05/22 88.1 kg  07/15/22 91.6 kg  06/18/22 88.1 kg  05/07/22 86.3 kg  04/04/22 86.9 kg  03/29/22 85.1 kg  02/22/22 90.7 kg  02/04/22 89.5 kg  12/21/21 91.2 kg    Body mass index is 32.7 kg/m.    Current diet order is heart healthy/carb modified, patient consumed 50% of breakfast today. Labs and medications reviewed.   No nutrition interventions warranted at this time. Recommend by continues to follow up with outpatient Dietitian for ongoing diet education. If nutrition issues arise, please consult RD.   Drusilla Kanner, RDN, LDN Clinical Nutrition

## 2023-03-14 NOTE — Care Management Obs Status (Signed)
MEDICARE OBSERVATION STATUS NOTIFICATION   Patient Details  Name: Alec [name] MRN: [id number] Date of Birth: [birth year]   Medicare Observation Status Notification Given:  Yes    Gala Lewandowsky, RN 03/14/2023, 5:09 PM

## 2023-03-15 DIAGNOSIS — M1A9XX0 Chronic gout, unspecified, without tophus (tophi): Secondary | ICD-10-CM | POA: Diagnosis present

## 2023-03-15 DIAGNOSIS — Z794 Long term (current) use of insulin: Secondary | ICD-10-CM

## 2023-03-15 DIAGNOSIS — N400 Enlarged prostate without lower urinary tract symptoms: Secondary | ICD-10-CM | POA: Diagnosis present

## 2023-03-15 DIAGNOSIS — H409 Unspecified glaucoma: Secondary | ICD-10-CM | POA: Diagnosis present

## 2023-03-15 DIAGNOSIS — I10 Essential (primary) hypertension: Secondary | ICD-10-CM | POA: Diagnosis not present

## 2023-03-15 DIAGNOSIS — N184 Chronic kidney disease, stage 4 (severe): Secondary | ICD-10-CM | POA: Diagnosis present

## 2023-03-15 DIAGNOSIS — G4733 Obstructive sleep apnea (adult) (pediatric): Secondary | ICD-10-CM | POA: Diagnosis present

## 2023-03-15 DIAGNOSIS — N179 Acute kidney failure, unspecified: Secondary | ICD-10-CM | POA: Diagnosis not present

## 2023-03-15 DIAGNOSIS — D61818 Other pancytopenia: Secondary | ICD-10-CM | POA: Diagnosis present

## 2023-03-15 DIAGNOSIS — M19012 Primary osteoarthritis, left shoulder: Secondary | ICD-10-CM | POA: Diagnosis present

## 2023-03-15 DIAGNOSIS — E1122 Type 2 diabetes mellitus with diabetic chronic kidney disease: Secondary | ICD-10-CM | POA: Diagnosis present

## 2023-03-15 DIAGNOSIS — M069 Rheumatoid arthritis, unspecified: Secondary | ICD-10-CM | POA: Diagnosis present

## 2023-03-15 DIAGNOSIS — Z951 Presence of aortocoronary bypass graft: Secondary | ICD-10-CM | POA: Diagnosis not present

## 2023-03-15 DIAGNOSIS — I5033 Acute on chronic diastolic (congestive) heart failure: Secondary | ICD-10-CM | POA: Diagnosis present

## 2023-03-15 DIAGNOSIS — E785 Hyperlipidemia, unspecified: Secondary | ICD-10-CM | POA: Diagnosis present

## 2023-03-15 DIAGNOSIS — N1832 Chronic kidney disease, stage 3b: Secondary | ICD-10-CM | POA: Diagnosis not present

## 2023-03-15 DIAGNOSIS — N401 Enlarged prostate with lower urinary tract symptoms: Secondary | ICD-10-CM | POA: Diagnosis not present

## 2023-03-15 DIAGNOSIS — J439 Emphysema, unspecified: Secondary | ICD-10-CM | POA: Diagnosis present

## 2023-03-15 DIAGNOSIS — Z801 Family history of malignant neoplasm of trachea, bronchus and lung: Secondary | ICD-10-CM | POA: Diagnosis not present

## 2023-03-15 DIAGNOSIS — J449 Chronic obstructive pulmonary disease, unspecified: Secondary | ICD-10-CM | POA: Diagnosis not present

## 2023-03-15 DIAGNOSIS — C61 Malignant neoplasm of prostate: Secondary | ICD-10-CM | POA: Diagnosis not present

## 2023-03-15 DIAGNOSIS — R0602 Shortness of breath: Secondary | ICD-10-CM | POA: Diagnosis present

## 2023-03-15 DIAGNOSIS — I130 Hypertensive heart and chronic kidney disease with heart failure and stage 1 through stage 4 chronic kidney disease, or unspecified chronic kidney disease: Secondary | ICD-10-CM | POA: Diagnosis present

## 2023-03-15 DIAGNOSIS — E871 Hypo-osmolality and hyponatremia: Secondary | ICD-10-CM | POA: Diagnosis not present

## 2023-03-15 DIAGNOSIS — I2510 Atherosclerotic heart disease of native coronary artery without angina pectoris: Secondary | ICD-10-CM | POA: Diagnosis present

## 2023-03-15 DIAGNOSIS — Z9049 Acquired absence of other specified parts of digestive tract: Secondary | ICD-10-CM | POA: Diagnosis not present

## 2023-03-15 DIAGNOSIS — Z6832 Body mass index (BMI) 32.0-32.9, adult: Secondary | ICD-10-CM | POA: Diagnosis not present

## 2023-03-15 DIAGNOSIS — F32A Depression, unspecified: Secondary | ICD-10-CM | POA: Diagnosis present

## 2023-03-15 DIAGNOSIS — Z833 Family history of diabetes mellitus: Secondary | ICD-10-CM | POA: Diagnosis not present

## 2023-03-15 LAB — CBC
HCT: 31.6 % — ABNORMAL LOW (ref 39.0–52.0)
Hemoglobin: 10.5 g/dL — ABNORMAL LOW (ref 13.0–17.0)
MCH: 31.9 pg (ref 26.0–34.0)
MCHC: 33.2 g/dL (ref 30.0–36.0)
MCV: 96 fL (ref 80.0–100.0)
Platelets: 111 10*3/uL — ABNORMAL LOW (ref 150–400)
RBC: 3.29 MIL/uL — ABNORMAL LOW (ref 4.22–5.81)
RDW: 15 % (ref 11.5–15.5)
WBC: 3.7 10*3/uL — ABNORMAL LOW (ref 4.0–10.5)
nRBC: 0 % (ref 0.0–0.2)

## 2023-03-15 LAB — GLUCOSE, CAPILLARY
Glucose-Capillary: 127 mg/dL — ABNORMAL HIGH (ref 70–99)
Glucose-Capillary: 156 mg/dL — ABNORMAL HIGH (ref 70–99)
Glucose-Capillary: 143 mg/dL — ABNORMAL HIGH (ref 70–99)
Glucose-Capillary: 213 mg/dL — ABNORMAL HIGH (ref 70–99)
Glucose-Capillary: 215 mg/dL — ABNORMAL HIGH (ref 70–99)

## 2023-03-15 LAB — BASIC METABOLIC PANEL
Anion gap: 12 (ref 5–15)
BUN: 53 mg/dL — ABNORMAL HIGH (ref 8–23)
CO2: 24 mmol/L (ref 22–32)
Calcium: 9.3 mg/dL (ref 8.9–10.3)
Chloride: 101 mmol/L (ref 98–111)
Creatinine, Ser: 4.08 mg/dL — ABNORMAL HIGH (ref 0.61–1.24)
GFR, Estimated: 15 mL/min — ABNORMAL LOW (ref >60)
Glucose, Bld: 94 mg/dL (ref 70–99)
Potassium: 3.5 mmol/L (ref 3.5–5.1)
Sodium: 137 mmol/L (ref 135–145)

## 2023-03-15 MED ORDER — MOMETASONE FURO-FORMOTEROL FUM 100-5 MCG/ACT IN AERO
2.0000 | INHALATION_SPRAY | Freq: Two times a day (BID) | RESPIRATORY_TRACT | Status: DC
  Administered 2023-03-15 – 2023-03-19 (×8): 2 via RESPIRATORY_TRACT
  Filled 2023-03-15 (×4): qty 8.8

## 2023-03-15 MED ORDER — GUAIFENESIN-DM 100-10 MG/5ML PO SYRP: 5.0000 mL | ORAL_SOLUTION | ORAL | Status: DC | PRN

## 2023-03-15 MED ORDER — METHYLPREDNISOLONE SODIUM SUCC 40 MG IJ SOLR
40.0000 mg | INTRAMUSCULAR | Status: DC
  Administered 2023-03-15 – 2023-03-16 (×2): 40 mg via INTRAVENOUS
  Filled 2023-03-15 (×2): qty 1

## 2023-03-15 MED ORDER — IPRATROPIUM-ALBUTEROL 0.5-2.5 (3) MG/3ML IN SOLN
3.0000 mL | Freq: Three times a day (TID) | RESPIRATORY_TRACT | Status: DC
  Administered 2023-03-16 – 2023-03-17 (×6): 3 mL via RESPIRATORY_TRACT
  Filled 2023-03-15 (×6): qty 3

## 2023-03-15 MED ORDER — IPRATROPIUM-ALBUTEROL 0.5-2.5 (3) MG/3ML IN SOLN
3.0000 mL | Freq: Four times a day (QID) | RESPIRATORY_TRACT | Status: DC
  Administered 2023-03-15: 3 mL via RESPIRATORY_TRACT
  Filled 2023-03-15 (×2): qty 3

## 2023-03-15 MED ORDER — IPRATROPIUM-ALBUTEROL 0.5-2.5 (3) MG/3ML IN SOLN: 3.0000 mL | RESPIRATORY_TRACT | Status: DC | PRN

## 2023-03-15 NOTE — Assessment & Plan Note (Signed)
Continue glucose cover and monitoring with insulin sliding scale,  His glucose is controlled, with fasting glucose of 94 this am.

## 2023-03-15 NOTE — Progress Notes (Signed)
Progress Note   Patient: Alec [name]:[id number] DOB: Apr 15, 1946 DOA: 03/13/2023     1 DOS: the patient was seen and examined on 03/15/2023   Brief hospital course: Alec [name] was admitted to the hospital with the working diagnosis of heart failure decompensation.   77 yo male with the past medical history of coronary artery disease sp CABG, CKD stage 4, heart failure, hypertension, dyslipidemia, T2DM, and OSA who presented with dyspnea. Endorsed worsening dyspnea over several months, with wheezing and worse with exertion. Positive orthopnea, PND and chest tightness. As outpatient he was treated for bronchitis with no significant improvement in his symptoms.   Na 137, K 4,2 Cl 107 bicarbonate 23, glucose 184 bun 47 cr 3,29  BNP 2,123  High sensitive troponin 27 and 31  Wbc 2,9 hgb 10.0 plt 95   Chest radiograph with no cardiomegaly, small pleural effusion, faint infiltrate at the right lower lobe.  CT chest with emphysematous changes, no infiltrates. Possible bronchiectasis per my interpretation.     Assessment and Plan: * COPD (chronic obstructive pulmonary disease) (HCC) Plan to continue bronchodilator therapy, inhaled and systemic corticosteroids.  Oxymetry monitoring,  Airway clearing techniques with flutter valve and incentive spirometer.  Out of bed to chair tid with meals.   Acute on chronic diastolic (congestive) heart failure (HCC) Echocardiogram with preserved LV systolic function EF 60 to 65%, RV systolic function preserved. No LVH, no wall motion abnormalities, RV systolic function preserved. LA with moderate dilatation. No significant valvular disease.   Today with no signs of hypervolemia. Hold on diuretic therapy and continue blood pressure control.   Essential hypertension Continue blood pressure control with clonidine, hydralazine, isosorbide and metoprolol.   CKD (chronic kidney disease), stage IV (HCC) Renal function with serum cr at 4,0 with K at 3,5 and  serum bicarbonate at 24. Na 137.  Plan to hold on diuretic therapy and follow up renal function in am. Avoid hypotension and nephrotoxic medications.   Type 2 diabetes mellitus with chronic kidney disease, with long-term current use of insulin (HCC) Continue glucose cover and monitoring with insulin sliding scale,  His glucose is controlled, with fasting glucose of 94 this am.   OSA (obstructive sleep apnea) Cpap as outpatient  Depression Continue with sertraline.  BPH (benign prostatic hyperplasia) No signs of urinary retention.         Subjective: Patient is feeling better but not yet back to baseline at home with wheezing and increased sputum production, along with dyspnea.   Physical Exam: Vitals:   03/15/23 0009 03/15/23 0429 03/15/23 0800 03/15/23 1121  BP: 108/67 (!) 140/81 118/71 112/63  Pulse: (!) 57 61 66 67  Resp: 18 18 17 18   Temp: 98 F (36.7 C) 97.6 F (36.4 C) 97.6 F (36.4 C) 98.6 F (37 C)  TempSrc: Oral Oral Oral Oral  SpO2: 98% 97% 93% 95%  Weight:  87.8 kg    Height:       Neurology awake and alert ENT with mild pallor Cardiovascular with S1 and S2 present and rhythmic with no gallops or murmurs No JVD No lower extremity edema Respiratory with rales bilaterally with no wheezing Abdomen with no distention   Data Reviewed:    Family Communication: I spoke with patient's wife at the bedside, we talked in detail about patient's condition, plan of care and prognosis and all questions were addressed.   Disposition: Status is: Observation The patient will require care spanning > 2 midnights and should be moved  to inpatient because: Iv steroids   Planned Discharge Destination: Home     Author: Coralie Keens, MD 03/15/2023 1:28 PM  For on call review [email or web address].

## 2023-03-15 NOTE — Assessment & Plan Note (Signed)
Continue with sertraline  

## 2023-03-15 NOTE — Plan of Care (Signed)
  Problem: Health Behavior/Discharge Planning: Goal: Ability to manage health-related needs will improve Outcome: Progressing   Problem: Clinical Measurements: Goal: Ability to maintain clinical measurements within normal limits will improve Outcome: Progressing Goal: Will remain free from infection Outcome: Progressing Goal: Diagnostic test results will improve Outcome: Progressing Goal: Respiratory complications will improve Outcome: Progressing   

## 2023-03-15 NOTE — Assessment & Plan Note (Signed)
Echocardiogram with preserved LV systolic function EF 60 to 65%, RV systolic function preserved. No LVH, no wall motion abnormalities, RV systolic function preserved. LA with moderate dilatation. No significant valvular disease.   Today with no signs of hypervolemia. Hold on diuretic therapy and continue blood pressure control.

## 2023-03-15 NOTE — Assessment & Plan Note (Signed)
Renal function with serum cr at 4,0 with K at 3,5 and serum bicarbonate at 24. Na 137.  Plan to hold on diuretic therapy and follow up renal function in am. Avoid hypotension and nephrotoxic medications.

## 2023-03-15 NOTE — Assessment & Plan Note (Signed)
Plan to continue bronchodilator therapy, inhaled and systemic corticosteroids.  Oxymetry monitoring,  Airway clearing techniques with flutter valve and incentive spirometer.  Out of bed to chair tid with meals.

## 2023-03-15 NOTE — Assessment & Plan Note (Signed)
Cpap as outpatient  °

## 2023-03-15 NOTE — Assessment & Plan Note (Signed)
No signs of urinary retention  

## 2023-03-15 NOTE — Progress Notes (Signed)
Pt refusing Cpap. 

## 2023-03-15 NOTE — Evaluation (Signed)
Occupational Therapy Evaluation Patient Details Name: Alec [name] MRN: [id number] DOB: April 07, 1946 Today's Date: 03/15/2023   History of Present Illness The pt is a 77 yo male presenting 6/13 with SOB, worse on exertion for 2 months. Work up showed elevated creatinine and BNP, likely CHF exacerbation. PMH includes: HTN, HLD, CAD s/p CABG x3, DM II, CHF, prostate cancer, CKD IV.   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 Pt currently needing supervision for ADLs, ind with mobility and transfers without AD. SpO2 96% on RA, HR in 70's with hallway ambulation. Began education on energy conservation strategies for home, would benefit from continued reinforcement. Pt presenting with impairments listed below, will follow acutely. Anticipate no OT follow up needs at d/c.       Recommendations for follow up therapy are one component of a multi-disciplinary discharge planning process, led by the attending physician.  Recommendations may be updated based on patient status, additional functional criteria and insurance authorization.   Assistance Recommended at Discharge PRN  Patient can return home with the following A little help with bathing/dressing/bathroom;Direct supervision/assist for medications management;Assistance with cooking/housework    Functional Status Assessment  Patient has had a recent decline in their functional status and demonstrates the ability to make significant improvements in function in a reasonable and predictable amount of time.  Equipment Recommendations  None recommended by OT    Recommendations for Other Services       Precautions / Restrictions Precautions Precautions: None Restrictions Weight Bearing Restrictions: No      Mobility Bed Mobility Overal bed mobility: Independent                  Transfers Overall transfer level: Independent Equipment  used: None                      Balance Overall balance assessment: No apparent balance deficits (not formally assessed)                                         ADL either performed or assessed with clinical judgement   ADL Overall ADL's : Needs assistance/impaired Eating/Feeding: Independent   Grooming: Supervision/safety;Standing   Upper Body Bathing: Supervision/ safety;Sitting;Standing   Lower Body Bathing: Supervison/ safety;Sitting/lateral leans;Sit to/from stand   Upper Body Dressing : Supervision/safety;Sitting;Standing   Lower Body Dressing: Supervision/safety;Sitting/lateral leans;Sit to/from stand   Toilet Transfer: Supervision/safety;Ambulation;Regular Social worker and Hygiene: Supervision/safety       Functional mobility during ADLs: Supervision/safety       Vision   Vision Assessment?: No apparent visual deficits     Perception Perception Perception Tested?: No   Praxis Praxis Praxis tested?: Not tested    Pertinent Vitals/Pain Pain Assessment Pain Assessment: No/denies pain     Hand Dominance Left   Extremity/Trunk Assessment Upper Extremity Assessment Upper Extremity Assessment: Overall WFL for tasks assessed   Lower Extremity Assessment Lower Extremity Assessment: Defer to PT evaluation   Cervical / Trunk Assessment Cervical / Trunk Assessment: Normal   Communication Communication Communication: No difficulties   Cognition Arousal/Alertness: Awake/alert Behavior During Therapy: WFL for tasks assessed/performed Overall Cognitive Status: Within Functional Limits for tasks assessed  General Comments  SpO2 96% on RA at end of session, HR in 70s    Exercises     Shoulder Instructions      Home Living Family/patient expects to be discharged to:: Private residence Living Arrangements: Spouse/significant other Available Help  at Discharge: Family;Available 24 hours/day Type of Home: House Home Access: Stairs to enter [employer] of Steps: 2 Entrance Stairs-Rails: None Home Layout: One level     Bathroom Shower/Tub: Chief Strategy Officer: Handicapped height Bathroom Accessibility: No   Home Equipment: Agricultural consultant (2 wheels);Rollator (4 wheels);Cane - single point;Shower seat;BSC/3in1          Prior Functioning/Environment Prior Level of Function : Independent/Modified Independent;Working/employed;Driving             Mobility Comments: no AD ADLs Comments: spouse assists with med mgmt        OT Problem List: Decreased activity tolerance      OT Treatment/Interventions: Self-care/ADL training;Therapeutic exercise;Energy conservation;DME and/or AE instruction;Therapeutic activities;Patient/family education;Balance training    OT Goals(Current goals can be found in the care plan section) Acute Rehab OT Goals Patient Stated Goal: none stated OT Goal Formulation: With patient Time For Goal Achievement: 03/29/23 Potential to Achieve Goals: Good ADL Goals Pt Will Perform Tub/Shower Transfer: Tub transfer;Shower transfer;Independently;ambulating Additional ADL Goal #1: pt will verbalize 3 energy conservation strategies in prep for ADLs Additional ADL Goal #2: pt will verbalize 3 CHF mgmt strategies in prep for ADLs  OT Frequency: Min 1X/week    Co-evaluation              AM-PAC OT "6 Clicks" Daily Activity     Outcome Measure Help from another person eating meals?: None Help from another person taking care of personal grooming?: None Help from another person toileting, which includes using toliet, bedpan, or urinal?: None Help from another person bathing (including washing, rinsing, drying)?: A Little Help from another person to put on and taking off regular upper body clothing?: None Help from another person to put on and taking off regular lower body  clothing?: A Little 6 Click Score: 22   End of Session Equipment Utilized During Treatment: Gait belt Nurse Communication: Mobility status  Activity Tolerance: Patient tolerated treatment well Patient left: in bed;with call bell/phone within reach;with family/visitor present  OT Visit Diagnosis: Unsteadiness on feet (R26.81);Other abnormalities of gait and mobility (R26.89);Muscle weakness (generalized) (M62.81)                Time: 0981-1914 OT Time Calculation (min): 15 min Charges:  OT General Charges $OT Visit: 1 Visit OT Evaluation $OT Eval Low Complexity: 1 Low  Aidyn Kellis K, OTD, OTR/L SecureChat Preferred Acute Rehab [phone]   Latisia Hilaire K Koonce 03/15/2023, 10:12 AM

## 2023-03-15 NOTE — Assessment & Plan Note (Signed)
Continue blood pressure control with clonidine, hydralazine, isosorbide and metoprolol.

## 2023-03-15 NOTE — Hospital Course (Signed)
Alec [name] was admitted to the hospital with the working diagnosis of heart failure decompensation.   77 yo male with the past medical history of coronary artery disease sp CABG, CKD stage 4, heart failure, hypertension, dyslipidemia, T2DM, and OSA who presented with dyspnea. Endorsed worsening dyspnea over several months, with wheezing and worse with exertion. Positive orthopnea, PND and chest tightness. As outpatient he was treated for bronchitis with no significant improvement in his symptoms.   Na 137, K 4,2 Cl 107 bicarbonate 23, glucose 184 bun 47 cr 3,29  BNP 2,123  High sensitive troponin 27 and 31  Wbc 2,9 hgb 10.0 plt 95   Chest radiograph with no cardiomegaly, small pleural effusion, faint infiltrate at the right lower lobe.  CT chest with emphysematous changes, no infiltrates. Possible bronchiectasis per my interpretation.

## 2023-03-16 DIAGNOSIS — N184 Chronic kidney disease, stage 4 (severe): Secondary | ICD-10-CM | POA: Diagnosis not present

## 2023-03-16 DIAGNOSIS — J449 Chronic obstructive pulmonary disease, unspecified: Secondary | ICD-10-CM | POA: Diagnosis not present

## 2023-03-16 DIAGNOSIS — I5033 Acute on chronic diastolic (congestive) heart failure: Secondary | ICD-10-CM | POA: Diagnosis not present

## 2023-03-16 DIAGNOSIS — I10 Essential (primary) hypertension: Secondary | ICD-10-CM | POA: Diagnosis not present

## 2023-03-16 LAB — GLUCOSE, CAPILLARY
Glucose-Capillary: 138 mg/dL — ABNORMAL HIGH (ref 70–99)
Glucose-Capillary: 217 mg/dL — ABNORMAL HIGH (ref 70–99)
Glucose-Capillary: 295 mg/dL — ABNORMAL HIGH (ref 70–99)
Glucose-Capillary: 292 mg/dL — ABNORMAL HIGH (ref 70–99)

## 2023-03-16 LAB — BASIC METABOLIC PANEL
Anion gap: 12 (ref 5–15)
BUN: 67 mg/dL — ABNORMAL HIGH (ref 8–23)
CO2: 23 mmol/L (ref 22–32)
Calcium: 9.2 mg/dL (ref 8.9–10.3)
Chloride: 98 mmol/L (ref 98–111)
Creatinine, Ser: 4.57 mg/dL — ABNORMAL HIGH (ref 0.61–1.24)
GFR, Estimated: 13 mL/min — ABNORMAL LOW (ref >60)
Glucose, Bld: 185 mg/dL — ABNORMAL HIGH (ref 70–99)
Potassium: 3.9 mmol/L (ref 3.5–5.1)
Sodium: 133 mmol/L — ABNORMAL LOW (ref 135–145)

## 2023-03-16 MED ORDER — SODIUM CHLORIDE 0.9 % IV SOLN: INTRAVENOUS | Status: DC

## 2023-03-16 NOTE — Progress Notes (Signed)
Physical Therapy Treatment Patient Details Name: Alec [name] MRN: [id number] DOB: 1946/05/21 Today's Date: 03/16/2023   History of Present Illness The pt is a 77 yo male presenting 6/13 with SOB, worse on exertion for 2 months. Work up showed elevated creatinine and BNP, likely CHF exacerbation. PMH includes: HTN, HLD, CAD s/p CABG x3, DM II, CHF, prostate cancer, CKD IV.    PT Comments    Pt tolerated treatment well today. Pt able to ambulate 125ft in hallway independently with no AD and navigate stairs. Pt presents at or near baseline mobility. Pt has no further acute PT needs and will be signing off. Re consult PT if mobility status changes.    Recommendations for follow up therapy are one component of a multi-disciplinary discharge planning process, led by the attending physician.  Recommendations may be updated based on patient status, additional functional criteria and insurance authorization.  Follow Up Recommendations       Assistance Recommended at Discharge PRN  Patient can return home with the following Assist for transportation   Equipment Recommendations  None recommended by PT    Recommendations for Other Services       Precautions / Restrictions Precautions Precautions: None Restrictions Weight Bearing Restrictions: No     Mobility  Bed Mobility Overal bed mobility: Independent                  Transfers Overall transfer level: Independent Equipment used: None                    Ambulation/Gait Ambulation/Gait assistance: Independent Gait Distance (Feet): 1200 Feet Assistive device: None Gait Pattern/deviations: WFL(Within Functional Limits) Gait velocity: decreased     General Gait Details: no LOB noted.   Stairs Stairs: Yes Stairs assistance: Independent Stair Management: One rail Left, Alternating pattern, Forwards Number of Stairs: 15 General stair comments: no LOB noted.   Wheelchair Mobility    Modified Rankin  (Stroke Patients Only)       Balance Overall balance assessment: No apparent balance deficits (not formally assessed)                                          Cognition Arousal/Alertness: Awake/alert Behavior During Therapy: WFL for tasks assessed/performed Overall Cognitive Status: Within Functional Limits for tasks assessed                                          Exercises      General Comments General comments (skin integrity, edema, etc.): VSS on RA      Pertinent Vitals/Pain Pain Assessment Pain Assessment: No/denies pain    Home Living                          Prior Function            PT Goals (current goals can now be found in the care plan section) Progress towards PT goals: Goals met/education completed, patient discharged from PT    Frequency    Other (Comment) (DCPT)      PT Plan Discharge plan needs to be updated    Co-evaluation              AM-PAC PT "6 Clicks" Mobility  Outcome Measure  Help needed turning from your back to your side while in a flat bed without using bedrails?: None Help needed moving from lying on your back to sitting on the side of a flat bed without using bedrails?: None Help needed moving to and from a bed to a chair (including a wheelchair)?: None Help needed standing up from a chair using your arms (e.g., wheelchair or bedside chair)?: None Help needed to walk in hospital room?: None Help needed climbing 3-5 steps with a railing? : None 6 Click Score: 24    End of Session Equipment Utilized During Treatment: Gait belt Activity Tolerance: Patient tolerated treatment well Patient left: in bed;with call bell/phone within reach;with family/visitor present Nurse Communication: Mobility status PT Visit Diagnosis: Other abnormalities of gait and mobility (R26.89)     Time: 4098-1191 PT Time Calculation (min) (ACUTE ONLY): 11 min  Charges:  $Gait Training: 8-22  mins                     Alec [name], PT, DPT Acute Rehab Services [phone]    Alec [name] 03/16/2023, 3:09 PM

## 2023-03-16 NOTE — Progress Notes (Signed)
Progress Note   Patient: Alec [name]:[id number] DOB: 11-Mar-1946 DOA: 03/13/2023     2 DOS: the patient was seen and examined on 03/16/2023   Brief hospital course: Alec [name] was admitted to the hospital with the working diagnosis of heart failure decompensation.   77 yo male with the past medical history of coronary artery disease sp CABG, CKD stage 4, heart failure, hypertension, dyslipidemia, T2DM, and OSA who presented with dyspnea. Endorsed worsening dyspnea over several months, with wheezing and worse with exertion. Positive orthopnea, PND and chest tightness. As outpatient he was treated for bronchitis with no significant improvement in his symptoms.   Na 137, K 4,2 Cl 107 bicarbonate 23, glucose 184 bun 47 cr 3,29  BNP 2,123  High sensitive troponin 27 and 31  Wbc 2,9 hgb 10.0 plt 95   Chest radiograph with no cardiomegaly, small pleural effusion, faint infiltrate at the right lower lobe.  CT chest with emphysematous changes, no infiltrates. Possible bronchiectasis per my interpretation.     Assessment and Plan: * COPD (chronic obstructive pulmonary disease) (HCC) Plan to continue bronchodilator therapy, inhaled and systemic corticosteroids.  Oxymetry monitoring,  Airway clearing techniques with flutter valve and incentive spirometer.  Out of bed to chair tid with meals.   Acute on chronic diastolic (congestive) heart failure (HCC) Echocardiogram with preserved LV systolic function EF 60 to 65%, RV systolic function preserved. No LVH, no wall motion abnormalities, RV systolic function preserved. LA with moderate dilatation. No significant valvular disease.   Today with no signs of hypervolemia. Continue to hold on diuretics.   Essential hypertension Continue blood pressure control with clonidine, hydralazine, isosorbide and metoprolol.   CKD (chronic kidney disease), stage IV (HCC) AKI  Worsening renal function with serum cr at 4,57 with K at 3,9 and serum  bicarbonate at 23.  Na 133  Plan to continue to hold on diuretics and will add gentle IV fluids with isotonic saline at 50 ml per hr.  Follow up renal function in am.   Avoid hypotension and nephrotoxic medications.   Type 2 diabetes mellitus with chronic kidney disease, with long-term current use of insulin (HCC) Continue glucose cover and monitoring with insulin sliding scale,  His glucose is controlled, with fasting glucose of 185 this am.   OSA (obstructive sleep apnea) Cpap as outpatient  Depression Continue with sertraline.  BPH (benign prostatic hyperplasia) No signs of urinary retention.         Subjective: Patient with improvement in dyspnea, no chest pain, no cough, no edema   Physical Exam: Vitals:   03/16/23 0039 03/16/23 0500 03/16/23 0821 03/16/23 1111  BP: (!) 130/57 124/62  (!) 150/56  Pulse: 67   70  Resp: 18   18  Temp: 98.6 F (37 C)   97.7 F (36.5 C)  TempSrc: Oral     SpO2:   97% 96%  Weight:      Height:       Neurology awake and alert ENT with mild pallor Cardiovascular with S1 and S2 present and rhythmic Respiratory with scattered rales with no wheezing, no rhonchi Abdomen with no distention  No lower extremity edema  Data Reviewed:    Family Communication: I spoke with patient's wife at the bedside, we talked in detail about patient's condition, plan of care and prognosis and all questions were addressed.   Disposition: Status is: Inpatient Remains inpatient appropriate because: pending renal function to improve   Planned Discharge Destination: Home  Author: Coralie Keens, MD 03/16/2023 12:31 PM  For on call review [email or web address].

## 2023-03-17 DIAGNOSIS — J449 Chronic obstructive pulmonary disease, unspecified: Secondary | ICD-10-CM

## 2023-03-17 DIAGNOSIS — N184 Chronic kidney disease, stage 4 (severe): Secondary | ICD-10-CM

## 2023-03-17 DIAGNOSIS — G4733 Obstructive sleep apnea (adult) (pediatric): Secondary | ICD-10-CM

## 2023-03-17 DIAGNOSIS — I10 Essential (primary) hypertension: Secondary | ICD-10-CM | POA: Diagnosis not present

## 2023-03-17 DIAGNOSIS — I5033 Acute on chronic diastolic (congestive) heart failure: Secondary | ICD-10-CM | POA: Diagnosis not present

## 2023-03-17 LAB — GLUCOSE, CAPILLARY
Glucose-Capillary: 197 mg/dL — ABNORMAL HIGH (ref 70–99)
Glucose-Capillary: 126 mg/dL — ABNORMAL HIGH (ref 70–99)
Glucose-Capillary: 194 mg/dL — ABNORMAL HIGH (ref 70–99)
Glucose-Capillary: 178 mg/dL — ABNORMAL HIGH (ref 70–99)
Glucose-Capillary: 139 mg/dL — ABNORMAL HIGH (ref 70–99)

## 2023-03-17 LAB — BASIC METABOLIC PANEL
Anion gap: 13 (ref 5–15)
BUN: 78 mg/dL — ABNORMAL HIGH (ref 8–23)
CO2: 21 mmol/L — ABNORMAL LOW (ref 22–32)
Calcium: 8.9 mg/dL (ref 8.9–10.3)
Chloride: 99 mmol/L (ref 98–111)
Creatinine, Ser: 4.31 mg/dL — ABNORMAL HIGH (ref 0.61–1.24)
GFR, Estimated: 14 mL/min — ABNORMAL LOW (ref >60)
Glucose, Bld: 278 mg/dL — ABNORMAL HIGH (ref 70–99)
Potassium: 4.4 mmol/L (ref 3.5–5.1)
Sodium: 133 mmol/L — ABNORMAL LOW (ref 135–145)

## 2023-03-17 MED ORDER — ORAL CARE MOUTH RINSE: 15.0000 mL | OROMUCOSAL | Status: DC | PRN

## 2023-03-17 NOTE — Plan of Care (Signed)
  Problem: Health Behavior/Discharge Planning: Goal: Ability to manage health-related needs will improve Outcome: Progressing   Problem: Clinical Measurements: Goal: Ability to maintain clinical measurements within normal limits will improve Outcome: Progressing Goal: Will remain free from infection Outcome: Progressing Goal: Diagnostic test results will improve Outcome: Progressing Goal: Respiratory complications will improve Outcome: Progressing Goal: Cardiovascular complication will be avoided Outcome: Progressing   Problem: Activity: Goal: Risk for activity intolerance will decrease Outcome: Progressing   Problem: Nutrition: Goal: Adequate nutrition will be maintained Outcome: Progressing   Problem: Elimination: Goal: Will not experience complications related to bowel motility Outcome: Progressing Goal: Will not experience complications related to urinary retention Outcome: Progressing   Problem: Pain Managment: Goal: General experience of comfort will improve Outcome: Progressing   Problem: Safety: Goal: Ability to remain free from injury will improve Outcome: Progressing   Problem: Skin Integrity: Goal: Risk for impaired skin integrity will decrease Outcome: Progressing   Problem: Education: Goal: Ability to demonstrate management of disease process will improve Outcome: Progressing Goal: Ability to verbalize understanding of medication therapies will improve Outcome: Progressing Goal: Individualized Educational Video(s) Outcome: Progressing   Problem: Activity: Goal: Capacity to carry out activities will improve Outcome: Progressing   Problem: Cardiac: Goal: Ability to achieve and maintain adequate cardiopulmonary perfusion will improve Outcome: Progressing   

## 2023-03-17 NOTE — Progress Notes (Signed)
Mobility Specialist Progress Note:   03/17/23 1518  Mobility  Activity Ambulated independently in hallway  Level of Assistance Independent  Assistive Device None  Distance Ambulated (ft) 500 ft  Activity Response Tolerated well  Mobility Referral Yes  $Mobility charge 1 Mobility  Mobility Specialist Start Time (ACUTE ONLY) 1430  Mobility Specialist Stop Time (ACUTE ONLY) 1440  Mobility Specialist Time Calculation (min) (ACUTE ONLY) 10 min    Pre Mobility:  121/57 BP   Pt received in bed, agreeable to mobility. Denied feelings of SOB or CP. Pt returned to EOB with call bell in hand and all needs met.    Leory Plowman  Mobility Specialist Please contact via [employer] office at [phone]

## 2023-03-17 NOTE — Progress Notes (Signed)
   CC:  Chief Complaint  Patient presents with   Shortness of Breath    Brief narrative: Alec [name] is a 77 y.o. male with a history of CAD status post CABG, CKD stage IV, bladder/prostate cancer, chronic diastolic heart hyperlipidemia, diabetes mellitus, obstructive sleep apnea, rheumatoid arthritis.  Patient presented secondary to shortness of breath was found to have evidence of acute heart failure.  Subjective:  Patient reports no dyspnea.  No chest pain.  Objective:  BP (!) 149/62 (BP Location: Left Arm)   Pulse (!) 52   Temp (!) 97.4 F (36.3 C) (Oral)   Resp 20   Ht 5\' 6"  (1.676 m)   Wt 91.4 kg   SpO2 94%   BMI 32.54 kg/m   General exam: Appears calm and comfortable Respiratory system: Clear to auscultation. Respiratory effort normal. Cardiovascular system: S1 & S2 heard, RRR. Gastrointestinal system: Abdomen is nondistended, soft and nontender. Normal bowel sounds heard. Central nervous system: Alert and oriented. No focal neurological deficits. Musculoskeletal: No calf tenderness Skin: No cyanosis. No rashes Psychiatry: Judgement and insight appear normal. Mood & affect appropriate.   Assessment/Plan:  Acute on chronic diastolic heart failure Present on admission.  Patient started on Lasix IV diuresis for management.  Echocardiogram significant for an LVEF of 66 5%.  Diuresis stopped secondary to development of AKI.  Acute heart failure appears to be resolved.  AKI on CKD stage IV Baseline creatinine appears to be around 3.2-3.3.  Creatinine bumped up to a peak of 4.57 with Lasix diuresis.  Lasix held and.  Creatinine improved slightly to 4.31 with an associated BUN of 70.  No uremic symptoms at this time Continue normal saline at 50 mL -Will recheck BMP in a.m.  COPD Stable. -Continue DuoNeb well lung, Dulera and  Hypertension -Continue clonidine, hydralazine, isosorbide mononitrate, metoprolol titrate  Diabetes mellitus type 2 Patient is currently  managed with an insulin pump.  Obstructive sleep apnea -Continue CPAP nightly  Hyponatremia Mild and possibly related to overdiuresis.  Likely improved with continuing IV fluids.  BPH -Continue Flomax  Hyperlipidemia -Continue Lipitor  Disposition: Likely home in 1 to 2 days pending improvement of renal function   Jacquelin Hawking, MD [facility] 03/17/2023, 3:34 PM

## 2023-03-17 NOTE — TOC Initial Note (Signed)
Transition of Care [facility]) - Initial/Assessment Note    Patient Details  Name: Alec [name] MRN: [id number] Date of Birth: July 28, 1946  Transition of [facility]) CM/SW Contact:    Leone Haven, RN Phone Number: 03/17/2023, 3:03 PM  Clinical Narrative:                 From home with wife, indep, he has PCP on file and insurance.  He states he currently does not use any DME at home and he does not have any HH services in place. Per pt/ot eval no f/u needed.   His wife will transport him home at dc.  Wife is his support system as well.  [facility] gets [facility] [address]    Expected Discharge Plan: Home/Self Care Barriers to Discharge: Continued Medical Work up   Patient Goals and CMS Choice Patient states their goals for this hospitalization and ongoing recovery are:: return home with wife   Choice offered to / list presented to : NA      Expected Discharge Plan and Services In-house Referral: NA Discharge Planning Services: CM Consult Post Acute Care Choice: NA Living arrangements for the past 2 months: Single Family Home                 DME Arranged: N/A DME Agency: NA       HH Arranged: NA          Prior Living Arrangements/Services Living arrangements for the past 2 months: Single Family Home Lives with:: Spouse Patient language and need for interpreter reviewed:: Yes Do you feel safe going back to the place where you live?: Yes      Need for Family Participation in Patient Care: Yes (Comment) Care giver support system in place?: Yes (comment)   Criminal Activity/Legal Involvement Pertinent to Current Situation/Hospitalization: No - Comment as needed  Activities of Daily Living Home Assistive Devices/Equipment: None ADL Screening (condition at time of admission) Patient's cognitive ability adequate to safely complete daily activities?: Yes Is the patient deaf or have difficulty hearing?: No Does the patient have difficulty seeing,  even when wearing glasses/contacts?: No Does the patient have difficulty concentrating, remembering, or making decisions?: No Patient able to express need for assistance with ADLs?: No Does the patient have difficulty dressing or bathing?: No Independently performs ADLs?: Yes (appropriate for developmental age) Does the patient have difficulty walking or climbing stairs?: No Weakness of Legs: None Weakness of Arms/Hands: None  Permission Sought/Granted                  Emotional Assessment Appearance:: Appears stated age Attitude/Demeanor/Rapport: Engaged Affect (typically observed): Appropriate Orientation: : Oriented to Self, Oriented to Place, Oriented to  Time, Oriented to Situation Alcohol / Substance Use: Not Applicable Psych Involvement: No (comment)  Admission diagnosis:  SOB (shortness of breath) [R06.02] Acute on chronic diastolic congestive heart failure (HCC) [I50.33] Acute on chronic diastolic (congestive) heart failure (HCC) [I50.33] Chest pain, unspecified type [R07.9] Stage 4 chronic kidney disease (HCC) [N18.4] COPD (chronic obstructive pulmonary disease) (HCC) [J44.9] Patient Active Problem List   Diagnosis Date Noted   COPD (chronic obstructive pulmonary disease) (HCC) 03/14/2023   Gout 03/14/2023   OSA (obstructive sleep apnea) 03/14/2023   Depression 03/14/2023   BPH (benign prostatic hyperplasia) 03/14/2023   Acute on chronic diastolic (congestive) heart failure (HCC) 03/13/2023   CKD (chronic kidney disease), stage IV (HCC) 04/03/2022   Acute on chronic diastolic CHF (congestive heart failure) (  HCC) 03/28/2022   Acute-on-chronic kidney injury (HCC) 03/28/2022   Snoring 07/18/2020   Excessive sleepiness 07/18/2020   Witnessed episode of apnea 07/18/2020   Laryngopharyngeal reflux (LPR) 07/04/2020   Lesion of nasal septum 07/04/2020   Leg swelling 04/30/2020   Bradycardia 04/30/2020   Malignant neoplasm of prostate (HCC) 04/18/2020   Educated  about COVID-19 virus infection 10/26/2019   Rheumatoid arthritis of multiple sites with negative rheumatoid factor (HCC) 09/06/2019   Shortness of breath 11/10/2018   Cough 11/10/2018   Wheezing 11/10/2018   Coronary artery disease involving native coronary artery of native heart without angina pectoris 11/10/2018   Dyslipidemia 11/10/2018   CRI (chronic renal insufficiency), stage 3 (moderate) (HCC) 08/06/2018   S/P CABG x 3 07/20/2018   Angina, class II (HCC) 07/14/2018   Abnormal stress electrocardiography using treadmill 07/14/2018   Cellulitis of left arm 12/10/2017   Cellulitis 12/09/2017   Diabetic nephropathy associated with type 2 diabetes mellitus (HCC) 12/14/2014   Essential hypertension 12/14/2014   Type 2 diabetes mellitus with chronic kidney disease, with long-term current use of insulin (HCC) 08/29/2012   Dyslipidemia, goal LDL below 70 08/29/2012   Abdominal pain 08/29/2012   PCP:  Dagoberto Ligas, MD Pharmacy:   [id number] [place], [place] - [address] [place] [address] Phone: [phone] Fax: [phone]  [id number] [place], [place] - [address] [place] [address] Phone: [phone] Fax: [phone]  [id number] [place], [place] - [address] AT [facility] [address] [place] [address] Phone: [phone] Fax: [phone]     Social Determinants of Health (SDOH) Social History: SDOH Screenings   Food Insecurity: Unknown (03/14/2023)  Housing: Low Risk  (03/14/2023)  Transportation Needs: No Transportation Needs (03/14/2023)  Utilities: Not At Risk (03/14/2023)  Depression (PHQ2-9): Low Risk  (02/27/2023)  Tobacco Use: Medium Risk (03/14/2023)   SDOH Interventions:     Readmission Risk Interventions    03/17/2023    3:00 PM 03/29/2022    2:24 PM  Readmission Risk Prevention Plan  Transportation Screening Complete  Complete  PCP or Specialist Appt within 5-7 Days  Complete  PCP or Specialist Appt within 3-5 Days Complete   Home Care Screening  Complete  Medication Review (RN CM)  Complete  HRI or Home Care Consult Complete   Palliative Care Screening Not Applicable   Medication Review (RN Care Manager) Complete

## 2023-03-18 DIAGNOSIS — C61 Malignant neoplasm of prostate: Secondary | ICD-10-CM | POA: Diagnosis not present

## 2023-03-18 DIAGNOSIS — N179 Acute kidney failure, unspecified: Secondary | ICD-10-CM

## 2023-03-18 DIAGNOSIS — N401 Enlarged prostate with lower urinary tract symptoms: Secondary | ICD-10-CM | POA: Diagnosis not present

## 2023-03-18 DIAGNOSIS — I5033 Acute on chronic diastolic (congestive) heart failure: Secondary | ICD-10-CM | POA: Diagnosis not present

## 2023-03-18 DIAGNOSIS — E1122 Type 2 diabetes mellitus with diabetic chronic kidney disease: Secondary | ICD-10-CM | POA: Diagnosis not present

## 2023-03-18 LAB — GLUCOSE, CAPILLARY
Glucose-Capillary: 99 mg/dL (ref 70–99)
Glucose-Capillary: 92 mg/dL (ref 70–99)
Glucose-Capillary: 176 mg/dL — ABNORMAL HIGH (ref 70–99)
Glucose-Capillary: 143 mg/dL — ABNORMAL HIGH (ref 70–99)

## 2023-03-18 LAB — BASIC METABOLIC PANEL
Anion gap: 9 (ref 5–15)
BUN: 76 mg/dL — ABNORMAL HIGH (ref 8–23)
CO2: 22 mmol/L (ref 22–32)
Calcium: 8.5 mg/dL — ABNORMAL LOW (ref 8.9–10.3)
Chloride: 105 mmol/L (ref 98–111)
Creatinine, Ser: 3.73 mg/dL — ABNORMAL HIGH (ref 0.61–1.24)
GFR, Estimated: 16 mL/min — ABNORMAL LOW (ref >60)
Glucose, Bld: 109 mg/dL — ABNORMAL HIGH (ref 70–99)
Potassium: 3.6 mmol/L (ref 3.5–5.1)
Sodium: 136 mmol/L (ref 135–145)

## 2023-03-18 MED ORDER — CARVEDILOL 12.5 MG PO TABS
12.5000 mg | ORAL_TABLET | Freq: Two times a day (BID) | ORAL | Status: DC
  Administered 2023-03-18 – 2023-03-19 (×2): 12.5 mg via ORAL
  Filled 2023-03-18 (×3): qty 1

## 2023-03-18 NOTE — Progress Notes (Signed)
Occupational Therapy Treatment Patient Details Name: Alec [name] MRN: [id number] DOB: Jan 31, 1946 Today's Date: 03/18/2023   History of present illness The pt is a 77 yo male presenting 6/13 with SOB, worse on exertion for 2 months. Work up showed elevated creatinine and BNP, likely CHF exacerbation. PMH includes: HTN, HLD, CAD s/p CABG x3, DM II, CHF, prostate cancer, CKD IV.   OT comments  Pt progressing/meeting goals this session, independently completing toilet transfer, bed mobility, and hallway distance ambulation, pt supervision for UB dressing. Pt educated on energy conservation strategies and reviewed CHF booklet and management strategies with pt/spouse, pt verbalized understanding. Pt presenting with impairments listed below, however has no acute OT needs at this time, will s/o. Please reconsult if there is a change in pt status. Anticipate no OT follow up needs at d/c.    Recommendations for follow up therapy are one component of a multi-disciplinary discharge planning process, led by the attending physician.  Recommendations may be updated based on patient status, additional functional criteria and insurance authorization.    Assistance Recommended at Discharge PRN  Patient can return home with the following  A little help with bathing/dressing/bathroom;Direct supervision/assist for medications management;Assistance with cooking/housework   Equipment Recommendations  None recommended by OT    Recommendations for Other Services PT consult    Precautions / Restrictions Precautions Precautions: None Restrictions Weight Bearing Restrictions: No       Mobility Bed Mobility Overal bed mobility: Independent                  Transfers Overall transfer level: Independent Equipment used: None                     Balance Overall balance assessment: No apparent balance deficits (not formally assessed)                                          ADL either performed or assessed with clinical judgement   ADL Overall ADL's : Needs assistance/impaired                 Upper Body Dressing : Supervision/safety;Sitting;Standing       Toilet Transfer: Ambulation;Regular Toilet;Independent           Functional mobility during ADLs: Independent      Extremity/Trunk Assessment Upper Extremity Assessment Upper Extremity Assessment: Overall WFL for tasks assessed   Lower Extremity Assessment Lower Extremity Assessment: Defer to PT evaluation        Vision   Vision Assessment?: No apparent visual deficits   Perception Perception Perception: Not tested   Praxis Praxis Praxis: Not tested    Cognition Arousal/Alertness: Awake/alert Behavior During Therapy: WFL for tasks assessed/performed Overall Cognitive Status: Within Functional Limits for tasks assessed                                          Exercises      Shoulder Instructions       General Comments VSS on RA    Pertinent Vitals/ Pain       Pain Assessment Pain Assessment: No/denies pain  Home Living  Prior Functioning/Environment              Frequency  Min 1X/week        Progress Toward Goals  OT Goals(current goals can now be found in the care plan section)  Progress towards OT goals: Progressing toward goals  Acute Rehab OT Goals Patient Stated Goal: none stated OT Goal Formulation: With patient Time For Goal Achievement: 03/29/23 Potential to Achieve Goals: Good ADL Goals Pt Will Perform Tub/Shower Transfer: Tub transfer;Shower transfer;Independently;ambulating Additional ADL Goal #1: pt will verbalize 3 energy conservation strategies in prep for ADLs Additional ADL Goal #2: pt will verbalize 3 CHF mgmt strategies in prep for ADLs  Plan All goals met and education completed, patient discharged from OT services    Co-evaluation                  AM-PAC OT "6 Clicks" Daily Activity     Outcome Measure   Help from another person eating meals?: None Help from another person taking care of personal grooming?: None Help from another person toileting, which includes using toliet, bedpan, or urinal?: None Help from another person bathing (including washing, rinsing, drying)?: A Little Help from another person to put on and taking off regular upper body clothing?: None Help from another person to put on and taking off regular lower body clothing?: A Little 6 Click Score: 22    End of Session    OT Visit Diagnosis: Unsteadiness on feet (R26.81);Other abnormalities of gait and mobility (R26.89);Muscle weakness (generalized) (M62.81)   Activity Tolerance Patient tolerated treatment well   Patient Left in bed;with call bell/phone within reach;with family/visitor present   Nurse Communication Mobility status        Time: 0981-1914 OT Time Calculation (min): 11 min  Charges: OT General Charges $OT Visit: 1 Visit OT Treatments $Therapeutic Activity: 8-22 mins  Carver Fila, OTD, OTR/L SecureChat Preferred Acute Rehab [phone]   [name] 03/18/2023, 9:50 AM

## 2023-03-18 NOTE — Care Management Important Message (Signed)
Important Message  Patient Details  Name: Alec [name] MRN: [id number] Date of Birth: [birth year]   Medicare Important Message Given:  Yes     Sherilyn Banker 03/18/2023, 2:13 PM

## 2023-03-18 NOTE — Progress Notes (Signed)
Mobility Specialist Progress Note:   03/18/23 1100  Mobility  Activity Ambulated independently in hallway  Level of Assistance Independent  Assistive Device None  Distance Ambulated (ft) 500 ft  Activity Response Tolerated well  Mobility Referral Yes  $Mobility charge 1 Mobility  Mobility Specialist Start Time (ACUTE ONLY) 1055  Mobility Specialist Stop Time (ACUTE ONLY) 1108  Mobility Specialist Time Calculation (min) (ACUTE ONLY) 13 min    Pre Mobility: 82 HR,  124/66 BP,   Post Mobility:  81 HR,  163/52 BP,    Pt received in chair, agreeable to mobility. Ambulated in hallway supervised with no AD. Asymptomatic throughout. Pt left in chair with call bell and family present.  D'Vante Earlene Plater Mobility Specialist Please contact via Special educational needs teacher or Rehab office at [phone]

## 2023-03-18 NOTE — Progress Notes (Signed)
PROGRESS NOTE  Alec [name] AVW:[id number] DOB: 1946-04-14   PCP: Dagoberto Ligas, MD  Patient is from: Home.  Lives with his wife.  Independently ambulates at baseline  DOA: 03/13/2023 LOS: 3  Chief complaints Chief Complaint  Patient presents with   Shortness of Breath     Brief Narrative / Interim history: 77 y.o. male with a history of CAD status post CABG, CKD stage IV, bladder/prostate cancer, chronic diastolic heart hyperlipidemia, diabetes mellitus, obstructive sleep apnea, rheumatoid arthritis.  Patient presented secondary to shortness of breath was found to have evidence of acute heart failure.  Started on IV Lasix and diuresis with significant improvement in his symptoms.Marland Kitchen  Unfortunately, he developed AKI and diuretics was held.  Renal function improving.  Likely home on 6/19 if renal function continues to improve  Subjective: Seen and examined earlier this morning.  No major events overnight of this morning.  No complaints.  Breathing has improved.  Ambulated with mobility tech without distress.  Patient's wife at bedside.  Objective: Vitals:   03/18/23 0622 03/18/23 0749 03/18/23 0821 03/18/23 1104  BP: (!) 173/62 (!) 157/74  (!) 163/52  Pulse: (!) 56 (!) 52  (!) 58  Resp: 18 19  20   Temp: 97.6 F (36.4 C) 97.6 F (36.4 C)  97.9 F (36.6 C)  TempSrc: Oral Oral  Oral  SpO2: 99% 98% 98% 97%  Weight: 92.4 kg     Height:        Examination:  GENERAL: No apparent distress.  Nontoxic. HEENT: MMM.  Vision and hearing grossly intact.  NECK: Supple.  No apparent JVD.  RESP:  No IWOB.  Fair aeration bilaterally. CVS:  RRR. Heart sounds normal.  ABD/GI/GU: BS+. Abd soft, NTND.  MSK/EXT:  Moves extremities. No apparent deformity. No edema.  SKIN: no apparent skin lesion or wound NEURO: Awake, alert and oriented appropriately.  No apparent focal neuro deficit. PSYCH: Calm. Normal affect.   Procedures:  None  Microbiology summarized: None  Assessment and  plan: Principal Problem:   Acute on chronic diastolic (congestive) heart failure (HCC) Active Problems:   COPD (chronic obstructive pulmonary disease) (HCC)   Essential hypertension   CKD (chronic kidney disease), stage IV (HCC)   Type 2 diabetes mellitus with chronic kidney disease, with long-term current use of insulin (HCC)   OSA (obstructive sleep apnea)   Depression   BPH (benign prostatic hyperplasia)   Malignant neoplasm of prostate (HCC)   AKI (acute kidney injury) (HCC)  Acute on chronic diastolic heart failure: Presents with dyspnea that has improved with diuretics.  TTE with LVEF of 60 to 65%.  Appears euvolemic on exam.  Diuretics held due to AKI.  About 2 L UOP/24 hours.  Net -4.4 L.  Creatinine improving. -Continue holding Lasix to allow renal recovery -Strict intake and output, daily weight, renal functions and electrolytes.   AKI on CKD stage IV: Baseline Cr ranges from 3.0-3.3.  AKI improving.  Followed by nephrology, Dr. Allena Katz.  Followed at [facility] for transplant. -Continue holding diuretics and nephrotoxic meds. -Recheck in the morning   Chronic COPD: Stable -Continue nebs and inhalers.   Hypertension: Slightly elevated SBP to 160s.  Has borderline bradycardia. -Discontinue clonidine and metoprolol, and start Coreg. -Continue hydralazine and Imdur -Continue holding diuretics   Controlled IDDM-2 with hyperglycemia: A1c 6.6% on 5/9.  On insulin pump. Recent Labs  Lab 03/17/23 1606 03/17/23 2103 03/18/23 0313 03/18/23 0619 03/18/23 1108  GLUCAP 178* 139* 99 92 176*  -  Continue insulin pump  Obstructive sleep apnea -Continue CPAP nightly   Hyponatremia: Resolved.  BPH -Continue Flomax   Hyperlipidemia -Continue Lipitor  Morbid obesity: Elevated BMI with comorbidity as above. Body mass index is 32.88 kg/m.           DVT prophylaxis:  enoxaparin (LOVENOX) injection 30 mg Start: 03/14/23 1000  Code Status: Full code Family Communication: Updated  patient's wife at bedside Level of care: Med-Surg Status is: Inpatient Remains inpatient appropriate because: CHF and AKI   Final disposition: Home in the next 24 to 48 hours. Consultants:  None  35 minutes with more than 50% spent in reviewing records, counseling patient/family and coordinating care.   Sch Meds:  Scheduled Meds:  allopurinol  200 mg Oral Daily   aspirin EC  81 mg Oral Daily   atorvastatin  40 mg Oral Daily   cloNIDine  0.1 mg Oral BID   docusate sodium  100 mg Oral BID   enoxaparin (LOVENOX) injection  30 mg Subcutaneous Q24H   fluticasone  2 spray Each Nare Daily   hydrALAZINE  100 mg Oral TID   insulin pump   Subcutaneous TID WC, HS, 0200   isosorbide mononitrate  120 mg Oral Daily   latanoprost  1 drop Both Eyes QHS   metoprolol tartrate  50 mg Oral BID   mometasone-formoterol  2 puff Inhalation BID   sertraline  100 mg Oral Daily   sodium chloride flush  3 mL Intravenous Q12H   tamsulosin  0.4 mg Oral QHS   Continuous Infusions:  sodium chloride Stopped (03/18/23 0410)   PRN Meds:.acetaminophen **OR** acetaminophen, bisacodyl, guaiFENesin-dextromethorphan, hydrALAZINE, ipratropium-albuterol, melatonin, ondansetron (ZOFRAN) IV, mouth rinse, polyethylene glycol  Antimicrobials: Anti-infectives (From admission, onward)    None        I have personally reviewed the following labs and images: CBC: Recent Labs  Lab 03/13/23 1817 03/14/23 0344 03/15/23 0028  WBC 2.9* 4.4 3.7*  NEUTROABS  --  2.8  --   HGB 10.0* 11.0* 10.5*  HCT 30.5* 33.4* 31.6*  MCV 98.7 98.5 96.0  PLT 95* 123* 111*   BMP &GFR Recent Labs  Lab 03/14/23 0344 03/15/23 0028 03/16/23 0058 03/17/23 0103 03/18/23 0100  NA 139 137 133* 133* 136  K 3.6 3.5 3.9 4.4 3.6  CL 104 101 98 99 105  CO2 24 24 23  21* 22  GLUCOSE 116* 94 185* 278* 109*  BUN 43* 53* 67* 78* 76*  CREATININE 3.35* 4.08* 4.57* 4.31* 3.73*  CALCIUM 9.5 9.3 9.2 8.9 8.5*  MG 2.4  --   --   --   --     Estimated Creatinine Clearance: 18.5 mL/min (A) (by C-G formula based on SCr of 3.73 mg/dL (H)). Liver & Pancreas: Recent Labs  Lab 03/14/23 0344  AST 24  ALT 20  ALKPHOS 63  BILITOT 1.4*  PROT 6.9  ALBUMIN 3.6   No results for input(s): "LIPASE", "AMYLASE" in the last 168 hours. No results for input(s): "AMMONIA" in the last 168 hours. Diabetic: No results for input(s): "HGBA1C" in the last 72 hours. Recent Labs  Lab 03/17/23 1606 03/17/23 2103 03/18/23 0313 03/18/23 0619 03/18/23 1108  GLUCAP 178* 139* 99 92 176*   Cardiac Enzymes: No results for input(s): "CKTOTAL", "CKMB", "CKMBINDEX", "TROPONINI" in the last 168 hours. No results for input(s): "PROBNP" in the last 8760 hours. Coagulation Profile: No results for input(s): "INR", "PROTIME" in the last 168 hours. Thyroid Function Tests: No results for input(s): "  TSH", "T4TOTAL", "FREET4", "T3FREE", "THYROIDAB" in the last 72 hours. Lipid Profile: No results for input(s): "CHOL", "HDL", "LDLCALC", "TRIG", "CHOLHDL", "LDLDIRECT" in the last 72 hours. Anemia Panel: No results for input(s): "VITAMINB12", "FOLATE", "FERRITIN", "TIBC", "IRON", "RETICCTPCT" in the last 72 hours. Urine analysis:    Component Value Date/Time   COLORURINE YELLOW 07/20/2018 0631   APPEARANCEUR CLEAR 07/20/2018 0631   LABSPEC 1.018 07/20/2018 0631   PHURINE 5.0 07/20/2018 0631   GLUCOSEU NEGATIVE 07/20/2018 0631   HGBUR NEGATIVE 07/20/2018 0631   BILIRUBINUR NEGATIVE 07/20/2018 0631   KETONESUR NEGATIVE 07/20/2018 0631   PROTEINUR NEGATIVE 07/20/2018 0631   UROBILINOGEN 0.2 08/29/2012 0550   NITRITE NEGATIVE 07/20/2018 0631   LEUKOCYTESUR NEGATIVE 07/20/2018 0631   Sepsis Labs: Invalid input(s): "PROCALCITONIN", "LACTICIDVEN"  Microbiology: No results found for this or any previous visit (from the past 240 hour(s)).  Radiology Studies: No results found.    Jasmarie Coppock T. Delinda Malan [facility]  If 7PM-7AM, please contact  night-coverage [email or web address] 03/18/2023, 1:56 PM

## 2023-03-19 ENCOUNTER — Encounter (HOSPITAL_COMMUNITY): Payer: Self-pay

## 2023-03-19 ENCOUNTER — Other Ambulatory Visit (HOSPITAL_COMMUNITY): Payer: Self-pay

## 2023-03-19 DIAGNOSIS — I10 Essential (primary) hypertension: Secondary | ICD-10-CM | POA: Diagnosis not present

## 2023-03-19 DIAGNOSIS — I5033 Acute on chronic diastolic (congestive) heart failure: Secondary | ICD-10-CM | POA: Diagnosis not present

## 2023-03-19 DIAGNOSIS — E1122 Type 2 diabetes mellitus with diabetic chronic kidney disease: Secondary | ICD-10-CM | POA: Diagnosis not present

## 2023-03-19 DIAGNOSIS — N184 Chronic kidney disease, stage 4 (severe): Secondary | ICD-10-CM | POA: Diagnosis not present

## 2023-03-19 LAB — RENAL FUNCTION PANEL
Albumin: 3 g/dL — ABNORMAL LOW (ref 3.5–5.0)
Anion gap: 8 (ref 5–15)
BUN: 70 mg/dL — ABNORMAL HIGH (ref 8–23)
CO2: 22 mmol/L (ref 22–32)
Calcium: 8.7 mg/dL — ABNORMAL LOW (ref 8.9–10.3)
Chloride: 106 mmol/L (ref 98–111)
Creatinine, Ser: 3.44 mg/dL — ABNORMAL HIGH (ref 0.61–1.24)
GFR, Estimated: 18 mL/min — ABNORMAL LOW (ref >60)
Glucose, Bld: 127 mg/dL — ABNORMAL HIGH (ref 70–99)
Phosphorus: 4.3 mg/dL (ref 2.5–4.6)
Potassium: 4.2 mmol/L (ref 3.5–5.1)
Sodium: 136 mmol/L (ref 135–145)

## 2023-03-19 LAB — IRON AND TIBC
Iron: 65 ug/dL (ref 45–182)
Saturation Ratios: 16 % — ABNORMAL LOW (ref 17.9–39.5)
TIBC: 413 ug/dL (ref 250–450)
UIBC: 348 ug/dL

## 2023-03-19 LAB — CBC
HCT: 29.4 % — ABNORMAL LOW (ref 39.0–52.0)
Hemoglobin: 9.6 g/dL — ABNORMAL LOW (ref 13.0–17.0)
MCH: 31.5 pg (ref 26.0–34.0)
MCHC: 32.7 g/dL (ref 30.0–36.0)
MCV: 96.4 fL (ref 80.0–100.0)
Platelets: 92 10*3/uL — ABNORMAL LOW (ref 150–400)
RBC: 3.05 MIL/uL — ABNORMAL LOW (ref 4.22–5.81)
RDW: 14.7 % (ref 11.5–15.5)
WBC: 2.4 10*3/uL — ABNORMAL LOW (ref 4.0–10.5)
nRBC: 0 % (ref 0.0–0.2)

## 2023-03-19 LAB — MAGNESIUM: Magnesium: 2.2 mg/dL (ref 1.7–2.4)

## 2023-03-19 LAB — RETICULOCYTES
Immature Retic Fract: 6.9 % (ref 2.3–15.9)
RBC.: 2.97 MIL/uL — ABNORMAL LOW (ref 4.22–5.81)
Retic Count, Absolute: 77.5 10*3/uL (ref 19.0–186.0)
Retic Ct Pct: 2.6 % (ref 0.4–3.1)

## 2023-03-19 LAB — GLUCOSE, CAPILLARY: Glucose-Capillary: 96 mg/dL (ref 70–99)

## 2023-03-19 LAB — FOLATE: Folate: 15.5 ng/mL (ref >5.9)

## 2023-03-19 LAB — VITAMIN B12: Vitamin B-12: 1610 pg/mL — ABNORMAL HIGH (ref 180–914)

## 2023-03-19 LAB — FERRITIN: Ferritin: 69 ng/mL (ref 24–336)

## 2023-03-19 MED ORDER — OLMESARTAN MEDOXOMIL 5 MG PO TABS: 5.0000 mg | ORAL_TABLET | Freq: Every day | ORAL | Status: DC

## 2023-03-19 MED ORDER — CARVEDILOL 12.5 MG PO TABS
12.5000 mg | ORAL_TABLET | Freq: Two times a day (BID) | ORAL | 1 refills | Status: DC
  Filled 2023-03-19: qty 180, 90d supply, fill #0

## 2023-03-19 MED ORDER — CARVEDILOL 12.5 MG PO TABS: 12.5000 mg | ORAL_TABLET | Freq: Two times a day (BID) | ORAL | 1 refills | Status: DC

## 2023-03-19 MED ORDER — SODIUM CHLORIDE 0.9 % IV SOLN
250.0000 mg | Freq: Once | INTRAVENOUS | Status: AC
  Administered 2023-03-19: 250 mg via INTRAVENOUS
  Filled 2023-03-19: qty 20

## 2023-03-19 MED ORDER — ALLOPURINOL 100 MG PO TABS: 50.0000 mg | ORAL_TABLET | Freq: Every day | ORAL | 3 refills | Status: AC

## 2023-03-19 MED ORDER — FUROSEMIDE 20 MG PO TABS: 20.0000 mg | ORAL_TABLET | Freq: Every day | ORAL | 3 refills | Status: DC

## 2023-03-19 NOTE — Progress Notes (Signed)
Heart Failure Navigator Progress Note  Assessed for Heart & Vascular TOC clinic readiness.  Patient EF 60-65%, with CKD IV, on the transplant list. No HF TOC at this time. .   Navigator will sign off at this time.   Rhae Hammock, BSN, Scientist, clinical (histocompatibility and immunogenetics) Only

## 2023-03-19 NOTE — Discharge Summary (Addendum)
Physician Discharge Summary  Alec [name]:[id number] DOB: [birth year] DOA: 03/13/2023  PCP: Dagoberto Ligas, MD  Admit date: 03/13/2023 Discharge date: 03/19/2023 Admitted From: Home Disposition: Home Recommendations for Outpatient Follow-up:  Follow up with PCP and nephrology in 1 week Recommend ambulatory referral to hematology for pancytopenia Check CMP and CBC at follow-up Please follow up on the following pending results: None  Home Health: Not indicated Equipment/Devices: Not indicated  Discharge Condition: Stable CODE STATUS: Full code  Follow-up Information     Dagoberto Ligas, MD. Schedule an appointment as soon as possible for a visit in 1 week(s).   Specialty: Nephrology Contact information: [address] [place] [address] [phone]                 Hospital course 77 y.o. male with a history of CAD status post CABG, CKD stage IV, bladder/prostate cancer, chronic diastolic heart hyperlipidemia, diabetes mellitus, obstructive sleep apnea, rheumatoid arthritis.  Patient presented secondary to shortness of breath was found to have evidence of acute heart failure.  BNP elevated to 2100.  Started on IV Lasix and diuresis with significant improvement in his symptoms.  Unfortunately, he developed AKI and diuretic was held.  Renal function improving after holding diuretics.  Had net -6.6 L.  Remained stable off diuretics without cardiopulmonary symptoms at rest or with ambulation.  Discharged to follow-up with PCP/nephrology.  Recommended holding Lasix and Benicar until 6/24.  Recommend repeat CBC and CMP at follow-up.  Of note, patient has chronic pancytopenia.  Anemia panel with mild iron deficiency.  Received IV ferric gluconate 250 mg x 1 prior to discharge.  Recommend ambulatory referral to hematology for further evaluation.  See individual problem list below for more.   Problems addressed during this hospitalization Principal Problem:   Acute on chronic  diastolic (congestive) heart failure (HCC) Active Problems:   COPD (chronic obstructive pulmonary disease) (HCC)   Essential hypertension   CKD (chronic kidney disease), stage IV (HCC)   Type 2 diabetes mellitus with chronic kidney disease, with long-term current use of insulin (HCC)   OSA (obstructive sleep apnea)   Depression   BPH (benign prostatic hyperplasia)   Malignant neoplasm of prostate (HCC)   AKI (acute kidney injury) (HCC)   Acute on chronic diastolic heart failure: Presents with dyspnea that has improved with diuretics.  TTE with LVEF of 60 to 65%.  Appears euvolemic on exam.  Diuretics held due to AKI.  Net -6.6 L. -Recommended holding Lasix until 6/24 -Reassess fluid status at follow-up and adjust diuretics as appropriate   AKI on CKD stage IV: Baseline Cr ranges from 3.0-3.3.  AKI improving.  Followed by nephrology, Dr. Allena Katz.  Followed at [facility] for [facility].  Excellent urine output of diuretics. Recent Labs    07/15/22 1648 10/04/22 1038 10/10/22 1707 03/13/23 1817 03/14/23 0344 03/15/23 0028 03/16/23 0058 03/17/23 0103 03/18/23 0100 03/19/23 0135  BUN 36* 46* 39* 47* 43* 53* 67* 78* 76* 70*  CREATININE 3.18* 3.27* 2.74* 3.29* 3.35* 4.08* 4.57* 4.31* 3.73* 3.44*  -Recommended holding Benicar and Lasix until 6/24.   Chronic COPD: Stable -Continue nebs and inhalers.   Hypertension: Slightly elevated SBP to 160s.  Has borderline bradycardia. -Discontinue clonidine and metoprolol, and start Coreg. -Continue hydralazine and Imdur -Resume Lasix and Benicar on 6/24.   Controlled IDDM-2 with hyperglycemia: A1c 6.6% on 5/9.  On insulin pump. -Continue home insulin pump.  Pancytopenia: Seems chronic.  Anemia panel with some iron deficiency.  Received IV ferric gluconate. -Recommend ambulatory referral to hematology outpatient   Obstructive sleep apnea -Continue CPAP nightly   Hyponatremia: Resolved.   BPH -Continue Flomax   Hyperlipidemia -Continue  Lipitor   Morbid obesity:  Body mass index is 32.81 kg/m. -Encourage lifestyle change to lose weight.           Time spent 35 minutes  Vital signs Vitals:   03/19/23 0416 03/19/23 0708 03/19/23 0749 03/19/23 1033  BP: (!) 168/68 (!) 160/62  (!) 166/68  Pulse: 63 (!) 59 60 64  Temp: 98 F (36.7 C) 98.2 F (36.8 C)  97.8 F (36.6 C)  Resp: 20 20 18 18   Height:      Weight: 92.2 kg     SpO2: 97% 98% 96% 97%  TempSrc: Oral Oral  Oral  BMI (Calculated): 32.82        Discharge exam  GENERAL: No apparent distress.  Nontoxic. HEENT: MMM.  Vision and hearing grossly intact.  NECK: Supple.  No apparent JVD.  RESP:  No IWOB.  Fair aeration bilaterally. CVS:  RRR. Heart sounds normal.  ABD/GI/GU: BS+. Abd soft, NTND.  MSK/EXT:  Moves extremities. No apparent deformity. No edema.  SKIN: no apparent skin lesion or wound NEURO: Awake and alert. Oriented appropriately.  No apparent focal neuro deficit. PSYCH: Calm. Normal affect.   Discharge Instructions Discharge Instructions     (HEART FAILURE PATIENTS) Call MD:  Anytime you have any of the following symptoms: 1) 3 pound weight gain in 24 hours or 5 pounds in 1 week 2) shortness of breath, with or without a dry hacking cough 3) swelling in the hands, feet or stomach 4) if you have to sleep on extra pillows at night in order to breathe.   Complete by: As directed    Call MD for:  difficulty breathing, headache or visual disturbances   Complete by: As directed    Call MD for:  extreme fatigue   Complete by: As directed    Call MD for:  persistant dizziness or light-headedness   Complete by: As directed    Diet - low sodium heart healthy   Complete by: As directed    Diet Carb Modified   Complete by: As directed    Discharge instructions   Complete by: As directed    It has been a pleasure taking care of you!  You were hospitalized due to shortness of breath for which you have been treated with fluid medication.  As a  result of treatment, you had worsening of kidney function that has improved after stopping the fluid medication.  We have made adjustment to your home medications during this hospitalization.  Please review your new medication list and the directions on your medications before you take them.  Follow-up with your primary care doctor in 1 to 2 weeks or sooner if needed. We also recommend follow-up with hematology for low blood levels.  You may ask your primary care doctor for referral.    Take care,   Increase activity slowly   Complete by: As directed       Allergies as of 03/19/2023       Reactions   Nsaids Other (See Comments)   Told to avoid by dr         Medication List     STOP taking these medications    cloNIDine 0.1 MG tablet Commonly known as: CATAPRES   metoprolol tartrate 50 MG tablet Commonly known as: [employer]  TART CHERRY ADVANCED PO       TAKE these medications    allopurinol 100 MG tablet Commonly known as: ZYLOPRIM Take 0.5 tablets (50 mg total) by mouth daily. What changed: how much to take   aspirin EC 81 MG tablet Take 81 mg by mouth daily.   atorvastatin 40 MG tablet Commonly known as: LIPITOR Take 1 tablet (40 mg total) by mouth daily.   carvedilol 12.5 MG tablet Commonly known as: COREG Take 1 tablet (12.5 mg total) by mouth 2 (two) times daily with a meal.   CENTRUM SILVER PO Take 1 tablet by mouth daily.   CINNAMON PO Take 500 mg by mouth daily.   furosemide 20 MG tablet Commonly known as: LASIX Take 1 tablet (20 mg total) by mouth daily. Start taking on: March 24, 2023 What changed: These instructions start on March 24, 2023. If you are unsure what to do until then, ask your doctor or other care provider.   hydrALAZINE 100 MG tablet Commonly known as: APRESOLINE Take 1 tablet (100 mg total) by mouth 3 (three) times daily.   isosorbide mononitrate 120 MG 24 hr tablet Commonly known as: IMDUR TAKE 1 TABLET BY MOUTH EVERY  DAY   latanoprost 0.005 % ophthalmic solution Commonly known as: XALATAN Place 1 drop into both eyes at bedtime.   nitroGLYCERIN 0.4 MG SL tablet Commonly known as: NITROSTAT Place 1 tablet (0.4 mg total) under the tongue every 5 (five) minutes as needed for chest pain.   NovoLOG 100 UNIT/ML injection Generic drug: insulin aspart USE MAX 85 UNITS DAILY VIA INSULIN PUMP What changed: See the new instructions.   olmesartan 5 MG tablet Commonly known as: BENICAR Take 1 tablet (5 mg total) by mouth daily. Start taking on: March 24, 2023 What changed: These instructions start on March 24, 2023. If you are unsure what to do until then, ask your doctor or other care provider.   OSTEO BI-FLEX ONE PER DAY PO Take 1 tablet by mouth daily.   sertraline 100 MG tablet Commonly known as: ZOLOFT Take 1 tablet (100 mg total) by mouth daily.   tamsulosin 0.4 MG Caps capsule Commonly known as: FLOMAX Take 0.4 mg by mouth at bedtime.   VITAMIN D3 PO Take 1 capsule by mouth daily.        Consultations: None  Procedures/Studies:   ECHOCARDIOGRAM COMPLETE  Result Date: 03/14/2023    ECHOCARDIOGRAM REPORT   Patient Name:   KELVEN DEETS [facility] Date of Exam: 03/14/2023 Medical Rec #:  [id number]      Height:       66.0 in Accession #:    [id number]     Weight:       202.6 lb Date of Birth:  10-21-1945     BSA:          2.011 m Patient Age:    76 years       BP:           175/58 mmHg Patient Gender: M              HR:           56 bpm. Exam Location:  Inpatient Procedure: 2D Echo, Cardiac Doppler and Color Doppler Indications:    CHF-Acute Systolic 428.21/I50.21  History:        Patient has prior history of Echocardiogram examinations, most                 recent 03/28/2022. CHF,  Angina and CAD, Prior CABG, COPD,                 Arrythmias:Bradycardia, Signs/Symptoms:Shortness of Breath; Risk                 Factors:Hypertension, Sleep Apnea, Diabetes and Dyslipidemia.                 CKD, stage 4.   Sonographer:    Lucendia Herrlich Referring Phys: [id number] JENNIFER YATES IMPRESSIONS  1. Left ventricular ejection fraction, by estimation, is 60 to 65%. The left ventricle has normal function. The left ventricle has no regional wall motion abnormalities. Left ventricular diastolic parameters were normal.  2. Right ventricular systolic function is normal. The right ventricular size is normal.  3. Left atrial size was moderately dilated.  4. The mitral valve is normal in structure. Trivial mitral valve regurgitation. No evidence of mitral stenosis.  5. The aortic valve is tricuspid. There is mild calcification of the aortic valve. There is mild thickening of the aortic valve. Aortic valve regurgitation is not visualized. Aortic valve sclerosis is present, with no evidence of aortic valve stenosis.  6. The inferior vena cava is normal in size with greater than 50% respiratory variability, suggesting right atrial pressure of 3 mmHg. FINDINGS  Left Ventricle: Left ventricular ejection fraction, by estimation, is 60 to 65%. The left ventricle has normal function. The left ventricle has no regional wall motion abnormalities. The left ventricular internal cavity size was normal in size. There is  no left ventricular hypertrophy. Left ventricular diastolic parameters were normal. Right Ventricle: The right ventricular size is normal. No increase in right ventricular wall thickness. Right ventricular systolic function is normal. Left Atrium: Left atrial size was moderately dilated. Right Atrium: Right atrial size was normal in size. Pericardium: There is no evidence of pericardial effusion. Mitral Valve: The mitral valve is normal in structure. Trivial mitral valve regurgitation. No evidence of mitral valve stenosis. Tricuspid Valve: The tricuspid valve is normal in structure. Tricuspid valve regurgitation is not demonstrated. No evidence of tricuspid stenosis. Aortic Valve: The aortic valve is tricuspid. There is mild  calcification of the aortic valve. There is mild thickening of the aortic valve. Aortic valve regurgitation is not visualized. Aortic valve sclerosis is present, with no evidence of aortic valve stenosis. Aortic valve peak gradient measures 11.6 mmHg. Pulmonic Valve: The pulmonic valve was normal in structure. Pulmonic valve regurgitation is trivial. No evidence of pulmonic stenosis. Aorta: The aortic root is normal in size and structure. Venous: The inferior vena cava is normal in size with greater than 50% respiratory variability, suggesting right atrial pressure of 3 mmHg. IAS/Shunts: No atrial level shunt detected by color flow Doppler.  LEFT VENTRICLE PLAX 2D LVIDd:         5.50 cm   Diastology LVIDs:         3.90 cm   LV e' medial:    5.78 cm/s LV PW:         1.10 cm   LV E/e' medial:  13.9 LV IVS:        1.10 cm   LV e' lateral:   8.24 cm/s LVOT diam:     2.20 cm   LV E/e' lateral: 9.7 LV SV:         103 LV SV Index:   51 LVOT Area:     3.80 cm  RIGHT VENTRICLE             IVC  RV S prime:     12.50 cm/s  IVC diam: 2.10 cm TAPSE (M-mode): 1.5 cm LEFT ATRIUM             Index        RIGHT ATRIUM           Index LA diam:        5.20 cm 2.59 cm/m   RA Area:     22.00 cm LA Vol (A2C):   59.8 ml 29.74 ml/m  RA Volume:   69.10 ml  34.36 ml/m LA Vol (A4C):   55.4 ml 27.55 ml/m LA Biplane Vol: 63.1 ml 31.38 ml/m  AORTIC VALVE                 PULMONIC VALVE AV Area (Vmax): 3.06 cm     PR End Diast Vel: 7.18 msec AV Vmax:        170.00 cm/s AV Peak Grad:   11.6 mmHg LVOT Vmax:      136.67 cm/s LVOT Vmean:     85.700 cm/s LVOT VTI:       0.272 m  AORTA Ao Root diam: 3.40 cm Ao Asc diam:  3.30 cm MITRAL VALVE MV Area (PHT): 3.31 cm    SHUNTS MV Decel Time: 229 msec    Systemic VTI:  0.27 m MV E velocity: 80.20 cm/s  Systemic Diam: 2.20 cm MV A velocity: 77.10 cm/s MV E/A ratio:  1.04 Charlton Haws MD Electronically signed by Charlton Haws MD Signature Date/Time: 03/14/2023/3:11:12 PM    Final    CT Chest Wo  Contrast  Result Date: 03/13/2023 CLINICAL DATA:  Respiratory illness with nondiagnostic x-ray. Suspect congestive heart failure. EXAM: CT CHEST WITHOUT CONTRAST TECHNIQUE: Multidetector CT imaging of the chest was performed following the standard protocol without IV contrast. RADIATION DOSE REDUCTION: This exam was performed according to the departmental dose-optimization program which includes automated exposure control, adjustment of the mA and/or kV according to patient size and/or use of iterative reconstruction technique. COMPARISON:  Chest radiograph 03/13/2023 FINDINGS: Cardiovascular: Normal heart size. No pericardial effusions. Postoperative changes consistent with coronary artery bypass. Normal caliber thoracic aorta. Calcification in the aorta and coronary arteries. Mediastinum/Nodes: Scattered lymph nodes without pathologic enlargement, likely reactive. Esophagus is decompressed. Thyroid gland is unremarkable. Lungs/Pleura: Emphysematous changes in the lungs. Vague mosaic attenuation pattern to the lungs is nonspecific but likely relates to air trapping or possibly edema. Small right pleural effusion with mild bilateral basilar atelectasis. Upper Abdomen: No acute abnormalities. Musculoskeletal: Degenerative changes in the spine. Sternotomy wires. IMPRESSION: 1. Emphysematous changes in the lungs. Mosaic attenuation pattern is likely related to air trapping in the setting of emphysema. Edema would be less likely. 2. Aortic atherosclerosis. Electronically Signed   By: Burman Nieves M.D.   On: 03/13/2023 20:33   DG Chest 2 View  Result Date: 03/13/2023 CLINICAL DATA:  Chest pain EXAM: CHEST - 2 VIEW COMPARISON:  07/15/2022 FINDINGS: Prior CABG. Heart and mediastinal contours are within normal limits. No focal opacities or effusions. No acute bony abnormality. IMPRESSION: No active cardiopulmonary disease. Electronically Signed   By: Charlett Nose M.D.   On: 03/13/2023 19:00       The results  of significant diagnostics from this hospitalization (including imaging, microbiology, ancillary and laboratory) are listed below for reference.     Microbiology: No results found for this or any previous visit (from the past 240 hour(s)).   Labs:  CBC: Recent Labs  Lab 03/13/23 1817 03/14/23  1610 03/15/23 0028 03/19/23 0135  WBC 2.9* 4.4 3.7* 2.4*  NEUTROABS  --  2.8  --   --   HGB 10.0* 11.0* 10.5* 9.6*  HCT 30.5* 33.4* 31.6* 29.4*  MCV 98.7 98.5 96.0 96.4  PLT 95* 123* 111* 92*   BMP &GFR Recent Labs  Lab 03/14/23 0344 03/15/23 0028 03/16/23 0058 03/17/23 0103 03/18/23 0100 03/19/23 0135  NA 139 137 133* 133* 136 136  K 3.6 3.5 3.9 4.4 3.6 4.2  CL 104 101 98 99 105 106  CO2 24 24 23  21* 22 22  GLUCOSE 116* 94 185* 278* 109* 127*  BUN 43* 53* 67* 78* 76* 70*  CREATININE 3.35* 4.08* 4.57* 4.31* 3.73* 3.44*  CALCIUM 9.5 9.3 9.2 8.9 8.5* 8.7*  MG 2.4  --   --   --   --  2.2  PHOS  --   --   --   --   --  4.3   Estimated Creatinine Clearance: 20 mL/min (A) (by C-G formula based on SCr of 3.44 mg/dL (H)). Liver & Pancreas: Recent Labs  Lab 03/14/23 0344 03/19/23 0135  AST 24  --   ALT 20  --   ALKPHOS 63  --   BILITOT 1.4*  --   PROT 6.9  --   ALBUMIN 3.6 3.0*   No results for input(s): "LIPASE", "AMYLASE" in the last 168 hours. No results for input(s): "AMMONIA" in the last 168 hours. Diabetic: No results for input(s): "HGBA1C" in the last 72 hours. Recent Labs  Lab 03/18/23 0313 03/18/23 0619 03/18/23 1108 03/18/23 1600 03/19/23 1031  GLUCAP 99 92 176* 143* 96   Cardiac Enzymes: No results for input(s): "CKTOTAL", "CKMB", "CKMBINDEX", "TROPONINI" in the last 168 hours. No results for input(s): "PROBNP" in the last 8760 hours. Coagulation Profile: No results for input(s): "INR", "PROTIME" in the last 168 hours. Thyroid Function Tests: No results for input(s): "TSH", "T4TOTAL", "FREET4", "T3FREE", "THYROIDAB" in the last 72 hours. Lipid  Profile: No results for input(s): "CHOL", "HDL", "LDLCALC", "TRIG", "CHOLHDL", "LDLDIRECT" in the last 72 hours. Anemia Panel: Recent Labs    03/19/23 0135 03/19/23 0841  VITAMINB12  --  1,610*  FOLATE 15.5  --   FERRITIN  --  69  TIBC  --  413  IRON  --  65  RETICCTPCT 2.6  --    Urine analysis:    Component Value Date/Time   COLORURINE YELLOW 07/20/2018 0631   APPEARANCEUR CLEAR 07/20/2018 0631   LABSPEC 1.018 07/20/2018 0631   PHURINE 5.0 07/20/2018 0631   GLUCOSEU NEGATIVE 07/20/2018 0631   HGBUR NEGATIVE 07/20/2018 0631   BILIRUBINUR NEGATIVE 07/20/2018 0631   KETONESUR NEGATIVE 07/20/2018 0631   PROTEINUR NEGATIVE 07/20/2018 0631   UROBILINOGEN 0.2 08/29/2012 0550   NITRITE NEGATIVE 07/20/2018 0631   LEUKOCYTESUR NEGATIVE 07/20/2018 0631   Sepsis Labs: Invalid input(s): "PROCALCITONIN", "LACTICIDVEN"   SIGNED:  Almon Hercules, MD  [facility] 03/19/2023, 1:59 PM

## 2023-03-19 NOTE — TOC Transition Note (Signed)
Transition of Care [facility]) - CM/SW Discharge Note   Patient Details  Name: Alec [name] MRN: [id number] Date of Birth: 1945/12/06  Transition of [facility] CM/SW Contact:  Leone Haven, RN Phone Number: 03/19/2023, 11:34 AM   Clinical Narrative:     Patient  is for dc today, his wife will transport him home at dc.  He has no needs.     Barriers to Discharge: Continued Medical Work up   Patient Goals and CMS Choice   Choice offered to / list presented to : NA  Discharge Placement                         Discharge Plan and Services Additional resources added to the After Visit Summary for   In-house Referral: NA Discharge Planning Services: CM Consult Post Acute Care Choice: NA          DME Arranged: N/A DME Agency: NA       HH Arranged: NA          Social Determinants of Health (SDOH) Interventions SDOH Screenings   Food Insecurity: Unknown (03/14/2023)  Housing: Low Risk  (03/14/2023)  Transportation Needs: No Transportation Needs (03/14/2023)  Utilities: Not At Risk (03/14/2023)  Depression (PHQ2-9): Low Risk  (02/27/2023)  Tobacco Use: Medium Risk (03/14/2023)     Readmission Risk Interventions    03/17/2023    3:00 PM 03/29/2022    2:24 PM  Readmission Risk Prevention Plan  Transportation Screening Complete Complete  PCP or Specialist Appt within 5-7 Days  Complete  PCP or Specialist Appt within 3-5 Days Complete   Home Care Screening  Complete  Medication Review (RN CM)  Complete  HRI or Home Care Consult Complete   Palliative Care Screening Not Applicable   Medication Review (RN Care Manager) Complete

## 2023-03-27 ENCOUNTER — Other Ambulatory Visit: Payer: Self-pay | Admitting: Endocrinology

## 2023-03-27 DIAGNOSIS — E1165 Type 2 diabetes mellitus with hyperglycemia: Secondary | ICD-10-CM

## 2023-04-10 DIAGNOSIS — E113293 Type 2 diabetes mellitus with mild nonproliferative diabetic retinopathy without macular edema, bilateral: Secondary | ICD-10-CM | POA: Diagnosis not present

## 2023-04-25 DIAGNOSIS — N184 Chronic kidney disease, stage 4 (severe): Secondary | ICD-10-CM | POA: Diagnosis not present

## 2023-04-25 DIAGNOSIS — D631 Anemia in chronic kidney disease: Secondary | ICD-10-CM | POA: Diagnosis not present

## 2023-04-25 DIAGNOSIS — N189 Chronic kidney disease, unspecified: Secondary | ICD-10-CM | POA: Diagnosis not present

## 2023-04-25 DIAGNOSIS — N2581 Secondary hyperparathyroidism of renal origin: Secondary | ICD-10-CM | POA: Diagnosis not present

## 2023-04-25 DIAGNOSIS — I129 Hypertensive chronic kidney disease with stage 1 through stage 4 chronic kidney disease, or unspecified chronic kidney disease: Secondary | ICD-10-CM | POA: Diagnosis not present

## 2023-05-02 ENCOUNTER — Encounter: Payer: Self-pay | Admitting: Podiatry

## 2023-05-02 ENCOUNTER — Ambulatory Visit (INDEPENDENT_AMBULATORY_CARE_PROVIDER_SITE_OTHER): Payer: Medicare Other | Admitting: Podiatry

## 2023-05-02 DIAGNOSIS — Z794 Long term (current) use of insulin: Secondary | ICD-10-CM | POA: Diagnosis not present

## 2023-05-02 DIAGNOSIS — N1832 Chronic kidney disease, stage 3b: Secondary | ICD-10-CM

## 2023-05-02 DIAGNOSIS — M79675 Pain in left toe(s): Secondary | ICD-10-CM

## 2023-05-02 DIAGNOSIS — B351 Tinea unguium: Secondary | ICD-10-CM

## 2023-05-02 DIAGNOSIS — M79674 Pain in right toe(s): Secondary | ICD-10-CM

## 2023-05-02 DIAGNOSIS — E1122 Type 2 diabetes mellitus with diabetic chronic kidney disease: Secondary | ICD-10-CM | POA: Diagnosis not present

## 2023-05-02 NOTE — Progress Notes (Signed)
This patient returns to my office for at risk foot care.  This patient requires this care by a professional since this patient will be at risk due to having diabetes.  This patient is unable to cut nails himself since the patient cannot reach his nails.These nails are painful walking and wearing shoes.  This patient presents for at risk foot care today.  General Appearance  Alert, conversant and in no acute stress.  Vascular  Dorsalis pedis and posterior tibial  pulses are palpable  bilaterally.  Capillary return is within normal limits  bilaterally. Temperature is within normal limits  bilaterally.  Neurologic  Senn-Weinstein monofilament wire test within normal limits  bilaterally. Muscle power within normal limits bilaterally.  Nails Thick disfigured discolored nails with subungual debris  from hallux to fifth toes bilaterally. No evidence of bacterial infection or drainage bilaterally.  Orthopedic  No limitations of motion  feet .  No crepitus or effusions noted.  No bony pathology or digital deformities noted.  Skin  normotropic skin with no porokeratosis noted bilaterally.  No signs of infections or ulcers noted.     Onychomycosis  Pain in right toes  Pain in left toes  Consent was obtained for treatment procedures.   Mechanical debridement of nails 1-5  bilaterally performed with a nail nipper.  Filed with dremel without incident. Wife helped with nail care.   Return office visit   10 weeks                 Told patient to return for periodic foot care and evaluation due to potential at risk complications.   Helane Gunther DPM

## 2023-05-03 ENCOUNTER — Other Ambulatory Visit: Payer: Self-pay | Admitting: Cardiology

## 2023-05-08 ENCOUNTER — Ambulatory Visit (INDEPENDENT_AMBULATORY_CARE_PROVIDER_SITE_OTHER): Payer: Medicare Other | Admitting: Pulmonary Disease

## 2023-05-08 ENCOUNTER — Encounter: Payer: Self-pay | Admitting: Pulmonary Disease

## 2023-05-08 VITALS — BP 132/70 | HR 59 | Ht 66.0 in | Wt 199.4 lb

## 2023-05-08 DIAGNOSIS — R0609 Other forms of dyspnea: Secondary | ICD-10-CM

## 2023-05-08 NOTE — Patient Instructions (Signed)
Nice to meet you  We will get pulmonary function tests to further investigate your symptoms  Try Stiolto 2 puffs in the morning  IF it is helping send me a MyChart message in a couple of weeks and let me know - we can prescribe if so  Return to clinic in 2-3 months after PFTs with Dr. Judeth Horn

## 2023-05-08 NOTE — Progress Notes (Signed)
@Patient  ID: Alec [name], male    DOB: 1946/01/01, 77 y.o.   MRN: [id number]  Chief Complaint  Patient presents with   Consult    Patient complains of SOB, states excessive activity makes it worse     Referring provider: Dagoberto Ligas, MD  HPI:   77 y.o. man with significant cardiac history including CABG and CHF whom we are seeing for evaluation of dyspnea on exertion.  Discharge summary 02-2022 were reviewed.  Multiple cardiology notes in the past reviewed.  Patient underwent CABG in 2019.  He states since then he has been short of breath with exertion.  Seems relatively stable may be low bit worse over time.  Acutely worsened in June 2024.  Present to the ED.  Was found to be in clinical volume overload.  BNP greater than 2K.  On my review and interpretation of small bilateral pleural effusions on chest x-ray, CT chest was obtained that showed mild interlobular septal thickening and small right pleural effusion concerning for cardiogenic volume overload.  He was diuresed.  He symptomatically improved per the discharge summary.  He agrees the symptoms have improved since then.  However he still has some residual dyspnea.  With exertion.  No time of day when things are better or worse.  No seasonal or environmental factors that make things better or worse.  No real alleviating or exacerbating factors.  Former smoker.  Quit around 2000.  Most recent TTE demonstrated dilated left atrium reportedly normal diastolic dysfunction, mitral valve regurgitation.  Questionaires / Pulmonary Flowsheets:   ACT:      No data to display          MMRC:     No data to display          Epworth:      No data to display          Tests:   FENO:  No results found for: "NITRICOXIDE"  PFT:    Latest Ref Rng & Units 07/16/2018    3:10 PM  PFT Results  FVC-Pre L 3.19   FVC-Predicted Pre % 84   FVC-Post L 3.24   FVC-Predicted Post % 85   Pre FEV1/FVC % % 82   Post FEV1/FCV % % 82    FEV1-Pre L 2.61   FEV1-Predicted Pre % 94   FEV1-Post L 2.65   Personally reviewed interpreted spirometry just of mild restriction versus air trapping, lung volumes within normal limits, no restriction  WALK:      No data to display          Imaging: Personally reviewed and as per EMR and discussion in this note No results found.  Lab Results: Personally reviewed CBC    Component Value Date/Time   WBC 2.4 (L) 03/19/2023 0135   RBC 3.05 (L) 03/19/2023 0135   RBC 2.97 (L) 03/19/2023 0135   HGB 9.6 (L) 03/19/2023 0135   HGB 12.1 (L) 10/26/2021 0902   HCT 29.4 (L) 03/19/2023 0135   HCT 33.7 (L) 10/26/2021 0902   PLT 92 (L) 03/19/2023 0135   PLT 128 (L) 10/26/2021 0902   MCV 96.4 03/19/2023 0135   MCV 92 10/26/2021 0902   MCH 31.5 03/19/2023 0135   MCHC 32.7 03/19/2023 0135   RDW 14.7 03/19/2023 0135   RDW 13.6 10/26/2021 0902   LYMPHSABS 1.0 03/14/2023 0344   LYMPHSABS 1.5 10/26/2021 0902   MONOABS 0.5 03/14/2023 0344   EOSABS 0.1 03/14/2023 0344   EOSABS  0.1 10/26/2021 0902   BASOSABS 0.0 03/14/2023 0344   BASOSABS 0.1 10/26/2021 0902    BMET    Component Value Date/Time   NA 136 03/19/2023 0135   NA 141 04/04/2022 1232   K 4.2 03/19/2023 0135   CL 106 03/19/2023 0135   CO2 22 03/19/2023 0135   GLUCOSE 127 (H) 03/19/2023 0135   BUN 70 (H) 03/19/2023 0135   BUN 63 (H) 04/04/2022 1232   CREATININE 3.44 (H) 03/19/2023 0135   CALCIUM 8.7 (L) 03/19/2023 0135   GFRNONAA 18 (L) 03/19/2023 0135   GFRAA 40 (L) 06/19/2020 0953    BNP    Component Value Date/Time   BNP 2,123.1 (H) 03/13/2023 1817    ProBNP No results found for: "PROBNP"  Specialty Problems       Pulmonary Problems   Cough   Shortness of breath   Wheezing   Laryngopharyngeal reflux (LPR)   Lesion of nasal septum   Snoring   Witnessed episode of apnea   COPD (chronic obstructive pulmonary disease) (HCC)   OSA (obstructive sleep apnea)    Allergies  Allergen Reactions    Nsaids Other (See Comments)    Told to avoid by dr     Immunization History  Administered Date(s) Administered   Influenza, High Dose Seasonal PF 07/08/2016, 06/17/2018, 07/27/2019, 06/13/2020   Influenza,inj,Quad PF,6+ Mos 06/28/2015   Influenza-Unspecified 07/17/2017   PFIZER(Purple Top)SARS-COV-2 Vaccination 11/20/2019, 12/13/2019   Pneumococcal Polysaccharide-23 08/30/2012    Past Medical History:  Diagnosis Date   Anemia    Anxiety    Aortic insufficiency    Arthritis    Bilateral lower extremity edema    Chronic gout    06-19-2020  per pt last episode 6 months , great toe   CKD (chronic kidney disease), stage IV (HCC)    Coronary artery disease cardiologist--- dr hochrein   CABG 2019   Degenerative arthritis of shoulder region 08/2013   left   History of bladder cancer 07/2008   s/p  TURBT   History of cellulitis 11/2017   left upper arm   History of kidney stones    Hyperlipidemia    Hypertension    followed by pcp/ cardiology   IDDM (insulin dependent diabetes mellitus)    endocrinologist--- dr Lucianne Muss---  pt uses insulin pump and dexcom  (06-19-2020 per pt fasting sugar-- 98--110)   Insulin pump in place    Mitral regurgitation    OSA (obstructive sleep apnea)    Prostate cancer [facility]) urologist--- dr herrick/  oncologist--- manning   dx 11/ 2017  Gleason 3+3 active survillance;  bx 03-14-2020 Gleason 3+4   RA (rheumatoid arthritis) (HCC)    rhemotologist--- dr Marylyn Ishihara---     S/P CABG x 3 07/15/2018   LIMA to LAD;  SVG to PDA;  SVG to RI   Thrombocytopenia (HCC)     Tobacco History: Social History   Tobacco Use  Smoking Status Former   Current packs/day: 0.00   Average packs/day: 2.0 packs/day for 25.0 years (50.0 ttl pk-yrs)   Types: Cigarettes   Start date: 09/11/1972   Quit date: 09/11/1997   Years since quitting: 25.6  Smokeless Tobacco Never   Counseling given: Not Answered   Continue to not smoke  Outpatient Encounter Medications as  of 05/08/2023  Medication Sig   allopurinol (ZYLOPRIM) 100 MG tablet Take 0.5 tablets (50 mg total) by mouth daily.   aspirin EC 81 MG tablet Take 81 mg by mouth daily.  atorvastatin (LIPITOR) 40 MG tablet TAKE 1 TABLET BY MOUTH EVERY DAY   Boswellia-Glucosamine-Vit D (OSTEO BI-FLEX ONE PER DAY PO) Take 1 tablet by mouth daily.   carvedilol (COREG) 12.5 MG tablet Take 1 tablet (12.5 mg total) by mouth 2 (two) times daily with a meal.   Cholecalciferol (VITAMIN D3 PO) Take 1 capsule by mouth daily.   CINNAMON PO Take 500 mg by mouth daily.   furosemide (LASIX) 20 MG tablet Take 1 tablet (20 mg total) by mouth daily.   hydrALAZINE (APRESOLINE) 100 MG tablet Take 1 tablet (100 mg total) by mouth 3 (three) times daily.   isosorbide mononitrate (IMDUR) 120 MG 24 hr tablet TAKE 1 TABLET BY MOUTH EVERY DAY   latanoprost (XALATAN) 0.005 % ophthalmic solution Place 1 drop into both eyes at bedtime.   Multiple Vitamins-Minerals (CENTRUM SILVER PO) Take 1 tablet by mouth daily.   NOVOLOG 100 UNIT/ML injection USE A MAX OF 85 UNITS DAILY VIA INSULIN PUMP   olmesartan (BENICAR) 5 MG tablet Take 1 tablet (5 mg total) by mouth daily.   sertraline (ZOLOFT) 100 MG tablet Take 1 tablet (100 mg total) by mouth daily.   tamsulosin (FLOMAX) 0.4 MG CAPS capsule Take 0.4 mg by mouth at bedtime.   nitroGLYCERIN (NITROSTAT) 0.4 MG SL tablet Place 1 tablet (0.4 mg total) under the tongue every 5 (five) minutes as needed for chest pain.   No facility-administered encounter medications on file as of 05/08/2023.     Review of Systems  Review of Systems  No chest pain with exertion.  No orthopnea or PND.  Comprehensive review of systems otherwise negative. Physical Exam  BP 132/70 (BP Location: Left Arm, Patient Position: Sitting, Cuff Size: Normal)   Pulse (!) 59   Ht 5\' 6"  (1.676 m)   Wt 199 lb 6.4 oz (90.4 kg)   SpO2 96%   BMI 32.18 kg/m   Wt Readings from Last 5 Encounters:  05/08/23 199 lb 6.4 oz (90.4  kg)  03/19/23 203 lb 4.2 oz (92.2 kg)  02/27/23 206 lb (93.4 kg)  02/13/23 204 lb 9.6 oz (92.8 kg)  10/22/22 199 lb (90.3 kg)    BMI Readings from Last 5 Encounters:  05/08/23 32.18 kg/m  03/19/23 32.81 kg/m  02/27/23 33.25 kg/m  02/13/23 33.02 kg/m  10/22/22 32.12 kg/m     Physical Exam General: Sitting in chair, no acute distress Eyes: EOMI, no icterus Neck: Supple, no JVP Pulmonary: Clear good air movement normal work of breathing on room air Cardiovascular: Regular rate and rhythm, no murmur Abdomen: Nondistended, bowel sounds present MSK: No synovitis, no joint effusion Neuro: Normal gait, no weakness Psych: Normal mood, full affect   Assessment & Plan:   Dyspnea on exertion: Chronic, present for 5 years and really started after CABG 2019.  Recently admitted to the hospital with CHF exacerbation with elevated BNP, overload on chest imaging.  Improvement in symptoms with diuresis.  Still with some residual dyspnea on exertion.  High suspicion for cardiac causes as a major driver of symptoms including diastolic dysfunction, MVR worsening with normal physiology of exercise.  Mild emphysematous changes on recent CT scan otherwise clear.  Will obtain PFTs for further evaluation. Trial Stiolto 2 puff daily to see if it helped symptomatically.  Tobacco abuse in remission: Quit 1999/2000.  Not a candidate for lung cancer screening.   Return in about 10 weeks (around 07/17/2023) for f/u Dr. Judeth Horn, after PFT.   Karren Burly, MD 05/08/2023  This appointment required 60 minutes of patient care (this includes precharting, chart review, review of results, face-to-face care, etc.).

## 2023-05-09 ENCOUNTER — Telehealth: Payer: Self-pay | Admitting: Cardiology

## 2023-05-09 NOTE — Telephone Encounter (Signed)
Pt c/o BP issue: STAT if pt c/o blurred vision, one-sided weakness or slurred speech  1. What are your last 5 BP readings? 196/69 HR 53  2. Are you having any other symptoms (ex. Dizziness, headache, blurred vision, passed out)? Headache   3. What is your BP issue? Patient recently had medication change. Was taken off medication at hospital and now experiencing higher BP. Requesting call back to discuss this.

## 2023-05-09 NOTE — Telephone Encounter (Signed)
Called pt back. Went straight to VM. Left a message that he should go to the ER for monitoring as it is after hours.

## 2023-05-12 NOTE — Telephone Encounter (Signed)
Call to patient. LM to please call office

## 2023-05-13 NOTE — Telephone Encounter (Signed)
Third attempt over 4 days to contact patient Call to mobile, straight to VM.  LM to call office  Call to home, LM to call office if still having concerns.

## 2023-05-15 ENCOUNTER — Encounter (HOSPITAL_BASED_OUTPATIENT_CLINIC_OR_DEPARTMENT_OTHER): Payer: Self-pay | Admitting: Emergency Medicine

## 2023-05-15 ENCOUNTER — Emergency Department (HOSPITAL_BASED_OUTPATIENT_CLINIC_OR_DEPARTMENT_OTHER)
Admission: EM | Admit: 2023-05-15 | Discharge: 2023-05-15 | Disposition: A | Discharge status: Home / Self Care | Payer: Medicare Other | Attending: Emergency Medicine | Admitting: Emergency Medicine

## 2023-05-15 ENCOUNTER — Emergency Department (HOSPITAL_BASED_OUTPATIENT_CLINIC_OR_DEPARTMENT_OTHER): Payer: Medicare Other

## 2023-05-15 ENCOUNTER — Other Ambulatory Visit: Payer: Self-pay

## 2023-05-15 DIAGNOSIS — N281 Cyst of kidney, acquired: Secondary | ICD-10-CM | POA: Diagnosis not present

## 2023-05-15 DIAGNOSIS — I129 Hypertensive chronic kidney disease with stage 1 through stage 4 chronic kidney disease, or unspecified chronic kidney disease: Secondary | ICD-10-CM | POA: Diagnosis not present

## 2023-05-15 DIAGNOSIS — Z87891 Personal history of nicotine dependence: Secondary | ICD-10-CM | POA: Insufficient documentation

## 2023-05-15 DIAGNOSIS — Z8546 Personal history of malignant neoplasm of prostate: Secondary | ICD-10-CM | POA: Insufficient documentation

## 2023-05-15 DIAGNOSIS — I700 Atherosclerosis of aorta: Secondary | ICD-10-CM | POA: Diagnosis not present

## 2023-05-15 DIAGNOSIS — Z8551 Personal history of malignant neoplasm of bladder: Secondary | ICD-10-CM | POA: Insufficient documentation

## 2023-05-15 DIAGNOSIS — E1122 Type 2 diabetes mellitus with diabetic chronic kidney disease: Secondary | ICD-10-CM | POA: Insufficient documentation

## 2023-05-15 DIAGNOSIS — R001 Bradycardia, unspecified: Secondary | ICD-10-CM | POA: Diagnosis not present

## 2023-05-15 DIAGNOSIS — R1012 Left upper quadrant pain: Secondary | ICD-10-CM | POA: Diagnosis not present

## 2023-05-15 DIAGNOSIS — K429 Umbilical hernia without obstruction or gangrene: Secondary | ICD-10-CM | POA: Diagnosis not present

## 2023-05-15 DIAGNOSIS — I2510 Atherosclerotic heart disease of native coronary artery without angina pectoris: Secondary | ICD-10-CM | POA: Diagnosis not present

## 2023-05-15 DIAGNOSIS — R109 Unspecified abdominal pain: Secondary | ICD-10-CM

## 2023-05-15 DIAGNOSIS — N184 Chronic kidney disease, stage 4 (severe): Secondary | ICD-10-CM | POA: Diagnosis not present

## 2023-05-15 LAB — CBC
HCT: 34.2 % — ABNORMAL LOW (ref 39.0–52.0)
Hemoglobin: 11.9 g/dL — ABNORMAL LOW (ref 13.0–17.0)
MCH: 32 pg (ref 26.0–34.0)
MCHC: 34.8 g/dL (ref 30.0–36.0)
MCV: 91.9 fL (ref 80.0–100.0)
Platelets: 105 10*3/uL — ABNORMAL LOW (ref 150–400)
RBC: 3.72 MIL/uL — ABNORMAL LOW (ref 4.22–5.81)
RDW: 12.9 % (ref 11.5–15.5)
WBC: 4.2 10*3/uL (ref 4.0–10.5)
nRBC: 0 % (ref 0.0–0.2)

## 2023-05-15 LAB — TROPONIN I (HIGH SENSITIVITY)
Troponin I (High Sensitivity): 22 ng/L — ABNORMAL HIGH (ref <18)
Troponin I (High Sensitivity): 17 ng/L (ref <18)

## 2023-05-15 LAB — COMPREHENSIVE METABOLIC PANEL
ALT: 20 U/L (ref 0–44)
AST: 21 U/L (ref 15–41)
Albumin: 3.7 g/dL (ref 3.5–5.0)
Alkaline Phosphatase: 69 U/L (ref 38–126)
Anion gap: 9 (ref 5–15)
BUN: 45 mg/dL — ABNORMAL HIGH (ref 8–23)
CO2: 22 mmol/L (ref 22–32)
Calcium: 9.4 mg/dL (ref 8.9–10.3)
Chloride: 108 mmol/L (ref 98–111)
Creatinine, Ser: 3.32 mg/dL — ABNORMAL HIGH (ref 0.61–1.24)
GFR, Estimated: 19 mL/min — ABNORMAL LOW (ref >60)
Glucose, Bld: 126 mg/dL — ABNORMAL HIGH (ref 70–99)
Potassium: 4.2 mmol/L (ref 3.5–5.1)
Sodium: 139 mmol/L (ref 135–145)
Total Bilirubin: 0.8 mg/dL (ref 0.3–1.2)
Total Protein: 7.5 g/dL (ref 6.5–8.1)

## 2023-05-15 LAB — LIPASE, BLOOD: Lipase: 29 U/L (ref 11–51)

## 2023-05-15 LAB — PROTIME-INR
INR: 1 (ref 0.8–1.2)
Prothrombin Time: 13.7 seconds (ref 11.4–15.2)

## 2023-05-15 MED ORDER — ONDANSETRON HCL 4 MG/2ML IJ SOLN
4.0000 mg | Freq: Once | INTRAMUSCULAR | Status: AC
  Administered 2023-05-15: 4 mg via INTRAVENOUS
  Filled 2023-05-15: qty 2

## 2023-05-15 MED ORDER — PANTOPRAZOLE SODIUM 40 MG IV SOLR
40.0000 mg | Freq: Once | INTRAVENOUS | Status: AC
  Administered 2023-05-15: 40 mg via INTRAVENOUS
  Filled 2023-05-15: qty 10

## 2023-05-15 MED ORDER — HYDROMORPHONE HCL 1 MG/ML IJ SOLN
1.0000 mg | Freq: Once | INTRAMUSCULAR | Status: AC
  Administered 2023-05-15: 1 mg via INTRAVENOUS
  Filled 2023-05-15: qty 1

## 2023-05-15 MED ORDER — OMEPRAZOLE 20 MG PO CPDR: 20.0000 mg | DELAYED_RELEASE_CAPSULE | Freq: Every day | ORAL | 1 refills | Status: DC

## 2023-05-15 NOTE — ED Notes (Signed)
ED Provider at bedside. 

## 2023-05-15 NOTE — ED Triage Notes (Signed)
Upper left abdominal pain radiates tor right abdominal area and to left back. X4 days denies injury or Hx of same.

## 2023-05-15 NOTE — ED Notes (Signed)
Patient transported to CT 

## 2023-05-15 NOTE — Discharge Instructions (Signed)
You were evaluated in the Emergency Department and after careful evaluation, we did not find any emergent condition requiring admission or further testing in the hospital.  Your exam/testing today was overall reassuring.  Symptoms may have been due to stomach acid related pain.  Take the omeprazole medication daily to prevent pain and follow up with your primary care doctor.   We found some lung nodules on your CT scan.  Radiology recommends repeat CT imaging in 3 months.  Talk to your primary care doctor or pulmonologist about this.  Please return to the Emergency Department if you experience any worsening of your condition.  Thank you for allowing [place] to be a part of your care.

## 2023-05-15 NOTE — ED Provider Notes (Signed)
[facility]EM[facility]PT [facility] Emergency Department Provider Note MRN:  [id number]  Arrival date & time: 05/15/23     Chief Complaint   Abdominal Pain   History of Present Illness   Alec [name] is a 77 y.o. year-old male with a history of CABG, CKD presenting to the ED with chief complaint of abdominal pain.  Left upper quadrant abdominal pain with radiation across the upper abdomen as well as through to the thoracic/lower back.  Present for 4 days.  Nausea but no vomiting, no diarrhea, normal bowel movements.  No chest pain or shortness of breath.  Review of Systems  A thorough review of systems was obtained and all systems are negative except as noted in the HPI and PMH.   Patient's Health History    Past Medical History:  Diagnosis Date   Anemia    Anxiety    Aortic insufficiency    Arthritis    Bilateral lower extremity edema    Chronic gout    06-19-2020  per pt last episode 6 months , great toe   CKD (chronic kidney disease), stage IV [facility])    Coronary artery disease cardiologist--- dr hochrein   CABG 2019   Degenerative arthritis of shoulder region 08/2013   left   History of bladder cancer 07/2008   s/p  TURBT   History of cellulitis 11/2017   left upper arm   History of kidney stones    Hyperlipidemia    Hypertension    followed by pcp/ cardiology   IDDM (insulin dependent diabetes mellitus)    endocrinologist--- dr Lucianne Muss---  pt uses insulin pump and dexcom  (06-19-2020 per pt fasting sugar-- 98--110)   Insulin pump in place    Mitral regurgitation    OSA (obstructive sleep apnea)    Prostate cancer [facility]) urologist--- dr herrick/  oncologist--- manning   dx 11/ 2017  Gleason 3+3 active survillance;  bx 03-14-2020 Gleason 3+4   RA (rheumatoid arthritis) (HCC)    rhemotologist--- dr Marylyn Ishihara---     S/P CABG x 3 07/15/2018   LIMA to LAD;  SVG to PDA;  SVG to RI   Thrombocytopenia [facility])     Past Surgical History:  Procedure Laterality  Date   CHOLECYSTECTOMY  08/29/2012   Procedure: LAPAROSCOPIC CHOLECYSTECTOMY WITH INTRAOPERATIVE CHOLANGIOGRAM;  Surgeon: Wilmon Arms. Tsuei, MD;  Location: [facility] ORS;  Service: General;  Laterality: N/A;   CORONARY ARTERY BYPASS GRAFT N/A 07/20/2018   Procedure: CORONARY ARTERY BYPASS GRAFTING (CABG) times three using left internal mammary artery to the LAD, and using endoscopically harvested right saphenous vein to PDA and intermedius.;  Surgeon: Delight Ovens, MD;  Location: [facility] OR;  Service: Open Heart Surgery;  Laterality: N/A;   LEFT HEART CATH AND CORONARY ANGIOGRAPHY N/A 07/15/2018   Procedure: LEFT HEART CATH AND CORONARY ANGIOGRAPHY;  Surgeon: Marykay Lex, MD;  Location: [facility] INVASIVE CV LAB;  Service: Cardiovascular;  Laterality: N/A;   RADIOACTIVE SEED IMPLANT N/A 06/21/2020   Procedure: RADIOACTIVE SEED IMPLANT/BRACHYTHERAPY IMPLANT;  Surgeon: Crist Fat, MD;  Location: [facility];  Service: Urology;  Laterality: N/A;   SHOULDER ARTHROSCOPY W/ ROTATOR CUFF REPAIR Right 09/03/2013   SHOULDER ARTHROSCOPY WITH SUBACROMIAL DECOMPRESSION, ROTATOR CUFF REPAIR AND BICEP TENDON REPAIR Left 09/03/2013   Procedure: LEFT SHOULDER ARTHROSCOPY WITH EXTENSIVE DEBRIDMENT, DISTAL CLAVICULECTOMY, ROTATOR CUFF REPAIR AND SUBACROMIAL DECOMPRESSION PARTIAL ACRIOMIOPLASTY WITH CORACOACROMIAL RELEASE;  Surgeon: Sheral Apley, MD;  Location: [facility];  Service: Orthopedics;  Laterality: Left;   SPACE OAR INSTILLATION N/A 06/21/2020   Procedure: SPACE OAR INSTILLATION;  Surgeon: Crist Fat, MD;  Location: [facility];  Service: Urology;  Laterality: N/A;   TEE WITHOUT CARDIOVERSION N/A 07/20/2018   Procedure: TRANSESOPHAGEAL ECHOCARDIOGRAM (TEE);  Surgeon: Delight Ovens, MD;  Location: [facility] OR;  Service: Open Heart Surgery;  Laterality: N/A;   TRANSURETHRAL RESECTION OF BLADDER TUMOR WITH MITOMYCIN-C  08/24/2008   @[facility]     Family History   Problem Relation Age of Onset   Diabetes Mother    Lung cancer Mother    Cancer Father    Coronary artery disease Father        MI at age 24.    Lung cancer Father    Sudden death Son    CAD Maternal Grandfather        MI in his 28's   CAD Paternal Grandfather    Breast cancer Neg Hx    Colon cancer Neg Hx    Pancreatic cancer Neg Hx     Social History   Socioeconomic History   Marital status: Married    Spouse name: Not on file   Number of children: Not on file   Years of education: Not on file   Highest education level: Not on file  Occupational History   Not on file  Tobacco Use   Smoking status: Former    Current packs/day: 0.00    Average packs/day: 2.0 packs/day for 25.0 years (50.0 ttl pk-yrs)    Types: Cigarettes    Start date: 09/11/1972    Quit date: 09/11/1997    Years since quitting: 25.6   Smokeless tobacco: Never  Vaping Use   Vaping status: Never Used  Substance and Sexual Activity   Alcohol use: Not Currently    Comment: >20 years ago, no heavy use   Drug use: Never   Sexual activity: Yes  Other Topics Concern   Not on file  Social History Narrative   Not on file   Social Determinants of Health   Financial Resource Strain: Not on file  Food Insecurity: Unknown (03/14/2023)   Hunger Vital Sign    Worried About Running Out of Food in the Last Year: Never true    Ran Out of Food in the Last Year: Patient declined  Transportation Needs: No Transportation Needs (03/14/2023)   PRAPARE - Administrator, Civil Service (Medical): No    Lack of Transportation (Non-Medical): No  Physical Activity: Not on file  Stress: Not on file  Social Connections: Not on file  Intimate Partner Violence: Not At Risk (03/14/2023)   Humiliation, Afraid, Rape, and Kick questionnaire    Fear of Current or Ex-Partner: No    Emotionally Abused: No    Physically Abused: No    Sexually Abused: No     Physical Exam   Vitals:   05/15/23 0600 05/15/23 0630   BP: (!) 134/57 136/60  Pulse: (!) 47 (!) 45  Resp: 11 16  Temp:    SpO2: 94% 96%    CONSTITUTIONAL: Chronically ill-appearing, NAD NEURO/PSYCH:  Alert and oriented x 3, no focal deficits EYES:  eyes equal and reactive ENT/NECK:  no LAD, no JVD CARDIO: Regular rate, well-perfused, normal S1 and S2 PULM:  CTAB no wheezing or rhonchi GI/GU:  non-distended, non-tender MSK/SPINE:  No gross deformities, no edema SKIN:  no rash, atraumatic   *Additional and/or pertinent findings included in MDM below  Diagnostic and Interventional  Summary    EKG Interpretation Date/Time:  Thursday May 15 2023 04:14:01 EDT Ventricular Rate:  53 PR Interval:  185 QRS Duration:  92 QT Interval:  458 QTC Calculation: 430 R Axis:   68  Text Interpretation: Sinus or ectopic atrial rhythm Abnormal T, consider ischemia, lateral leads Confirmed by Kennis Carina [phone]) on 05/15/2023 4:34:14 AM       Labs Reviewed  CBC - Abnormal; Notable for the following components:      Result Value   RBC 3.72 (*)    Hemoglobin 11.9 (*)    HCT 34.2 (*)    Platelets 105 (*)    All other components within normal limits  COMPREHENSIVE METABOLIC PANEL - Abnormal; Notable for the following components:   Glucose, Bld 126 (*)    BUN 45 (*)    Creatinine, Ser 3.32 (*)    GFR, Estimated 19 (*)    All other components within normal limits  TROPONIN I (HIGH SENSITIVITY) - Abnormal; Notable for the following components:   Troponin I (High Sensitivity) 22 (*)    All other components within normal limits  LIPASE, BLOOD  PROTIME-INR  TROPONIN I (HIGH SENSITIVITY)    DG Chest Port 1 View  Final Result    CT ABDOMEN PELVIS WO CONTRAST  Final Result      Medications  HYDROmorphone (DILAUDID) injection 1 mg (1 mg Intravenous Given 05/15/23 0421)  ondansetron (ZOFRAN) injection 4 mg (4 mg Intravenous Given 05/15/23 0420)  pantoprazole (PROTONIX) injection 40 mg (40 mg Intravenous Given 05/15/23 0515)      Procedures  /  Critical Care Procedures  ED Course and Medical Decision Making  Initial Impression and Ddx Differential diagnosis includes gastritis, pancreatitis, splenic infarct, gastric ulcer with perforation, duodenitis, AAA  Past medical/surgical history that increases complexity of ED encounter:  CAD  Interpretation of Diagnostics I personally reviewed the EKG and my interpretation is as follows:  sinus bradycardia with non-specific findings  Labs reassuring with no significant blood count or electrolyte disturbance.  Troponin minimally elevated and repeat is downtrending.  Patient Reassessment and Ultimate Disposition/Management     Patient's pain resolved.  Was observed in the ED for a couple hours with no return of pain.  CT abdomen without acute process.  No emergent process is evident at this time, plan is for discharge with PCP follow up.  Patient management required discussion with the following services or consulting groups:  None  Complexity of Problems Addressed Acute illness or injury that poses threat of life of bodily function  Additional Data Reviewed and Analyzed Further history obtained from: Further history from spouse/family member  Additional Factors Impacting ED Encounter Risk Prescriptions and Use of parenteral controlled substances  Elmer Sow. Pilar Plate, MD [facility] Emergency Medicine [facility] [email or web address]  Final Clinical Impressions(s) / ED Diagnoses     ICD-10-CM   1. Abdominal pain, unspecified abdominal location  R10.9       ED Discharge Orders          Ordered    omeprazole (PRILOSEC) 20 MG capsule  Daily        05/15/23 0636             Discharge Instructions Discussed with and Provided to Patient:     Discharge Instructions      You were evaluated in the Emergency Department and after careful evaluation, we did not find any emergent condition requiring admission or further testing in the  hospital.  Your  exam/testing today was overall reassuring.  Symptoms may have been due to stomach acid related pain.  Take the omeprazole medication daily to prevent pain and follow up with your primary care doctor.   We found some lung nodules on your CT scan.  Radiology recommends repeat CT imaging in 3 months.  Talk to your primary care doctor or pulmonologist about this.  Please return to the Emergency Department if you experience any worsening of your condition.  Thank you for allowing [place] to be a part of your care.        Sabas Sous, MD 05/15/23 [id number]

## 2023-05-16 ENCOUNTER — Other Ambulatory Visit (HOSPITAL_BASED_OUTPATIENT_CLINIC_OR_DEPARTMENT_OTHER): Payer: Self-pay

## 2023-05-16 ENCOUNTER — Other Ambulatory Visit: Payer: Self-pay

## 2023-05-16 ENCOUNTER — Other Ambulatory Visit: Payer: Medicare Other

## 2023-05-16 ENCOUNTER — Ambulatory Visit: Payer: Medicare Other | Admitting: Dietician

## 2023-05-16 ENCOUNTER — Emergency Department (HOSPITAL_BASED_OUTPATIENT_CLINIC_OR_DEPARTMENT_OTHER)
Admission: EM | Admit: 2023-05-16 | Discharge: 2023-05-16 | Disposition: A | Discharge status: Home / Self Care | Payer: Medicare Other | Attending: Emergency Medicine | Admitting: Emergency Medicine

## 2023-05-16 ENCOUNTER — Encounter (HOSPITAL_BASED_OUTPATIENT_CLINIC_OR_DEPARTMENT_OTHER): Payer: Self-pay | Admitting: Emergency Medicine

## 2023-05-16 DIAGNOSIS — E1122 Type 2 diabetes mellitus with diabetic chronic kidney disease: Secondary | ICD-10-CM | POA: Insufficient documentation

## 2023-05-16 DIAGNOSIS — I2510 Atherosclerotic heart disease of native coronary artery without angina pectoris: Secondary | ICD-10-CM | POA: Diagnosis not present

## 2023-05-16 DIAGNOSIS — I129 Hypertensive chronic kidney disease with stage 1 through stage 4 chronic kidney disease, or unspecified chronic kidney disease: Secondary | ICD-10-CM | POA: Diagnosis not present

## 2023-05-16 DIAGNOSIS — Z8546 Personal history of malignant neoplasm of prostate: Secondary | ICD-10-CM | POA: Insufficient documentation

## 2023-05-16 DIAGNOSIS — N184 Chronic kidney disease, stage 4 (severe): Secondary | ICD-10-CM | POA: Diagnosis not present

## 2023-05-16 DIAGNOSIS — R109 Unspecified abdominal pain: Secondary | ICD-10-CM | POA: Diagnosis not present

## 2023-05-16 DIAGNOSIS — R21 Rash and other nonspecific skin eruption: Secondary | ICD-10-CM | POA: Diagnosis present

## 2023-05-16 DIAGNOSIS — M549 Dorsalgia, unspecified: Secondary | ICD-10-CM | POA: Insufficient documentation

## 2023-05-16 DIAGNOSIS — B029 Zoster without complications: Secondary | ICD-10-CM | POA: Insufficient documentation

## 2023-05-16 DIAGNOSIS — Z951 Presence of aortocoronary bypass graft: Secondary | ICD-10-CM | POA: Insufficient documentation

## 2023-05-16 MED ORDER — ACYCLOVIR 800 MG PO TABS
800.0000 mg | ORAL_TABLET | Freq: Every day | ORAL | 0 refills | Status: AC
  Filled 2023-05-16: qty 50, 10d supply, fill #0

## 2023-05-16 MED ORDER — GABAPENTIN 100 MG PO CAPS
100.0000 mg | ORAL_CAPSULE | Freq: Three times a day (TID) | ORAL | 0 refills | Status: AC | PRN
  Filled 2023-05-16: qty 20, 7d supply, fill #0

## 2023-05-16 NOTE — ED Provider Notes (Signed)
Emergency Department Provider Note   I have reviewed the triage vital signs and the nursing notes.   HISTORY  Chief Complaint Back Pain and Abdominal Pain   HPI Alec [name] is a 77 y.o. male with past history reviewed below presents emergency department for evaluation of continued left abdominal and back pain.  He was seen earlier this morning in the ED for the symptoms with workup including CT and labs.  Began to feel better and returned home but pain returned.  He comes back for reevaluation.  He has noticed a rash on his abdomen which she did not notice before.  He has not had shingles in the past.  No rash to the face or any of the eyes.  No vision changes.  Past Medical History:  Diagnosis Date   Anemia    Anxiety    Aortic insufficiency    Arthritis    Bilateral lower extremity edema    Chronic gout    06-19-2020  per pt last episode 6 months , great toe   CKD (chronic kidney disease), stage IV [facility])    Coronary artery disease cardiologist--- dr hochrein   CABG 2019   Degenerative arthritis of shoulder region 08/2013   left   History of bladder cancer 07/2008   s/p  TURBT   History of cellulitis 11/2017   left upper arm   History of kidney stones    Hyperlipidemia    Hypertension    followed by pcp/ cardiology   IDDM (insulin dependent diabetes mellitus)    endocrinologist--- dr Lucianne Muss---  pt uses insulin pump and dexcom  (06-19-2020 per pt fasting sugar-- 98--110)   Insulin pump in place    Mitral regurgitation    OSA (obstructive sleep apnea)    Prostate cancer [facility]) urologist--- dr herrick/  oncologist--- manning   dx 11/ 2017  Gleason 3+3 active survillance;  bx 03-14-2020 Gleason 3+4   RA (rheumatoid arthritis) (HCC)    rhemotologist--- dr Marylyn Ishihara---     S/P CABG x 3 07/15/2018   LIMA to LAD;  SVG to PDA;  SVG to RI   Thrombocytopenia (HCC)     Review of Systems  Constitutional: No fever/chills Cardiovascular: Denies chest  pain. Respiratory: Denies shortness of breath. Gastrointestinal: Positive abdominal pain.  Genitourinary: Negative for dysuria. Musculoskeletal: Positive for back pain. Skin: Positive rash.  Neurological: Negative for headaches.   ____________________________________________   PHYSICAL EXAM:  VITAL SIGNS: ED Triage Vitals  Encounter Vitals Group     BP 05/16/23 0759 (!) 143/78     Pulse Rate 05/16/23 0759 62     Resp 05/16/23 0759 18     Temp 05/16/23 0759 98.2 F (36.8 C)     Temp Source 05/16/23 0759 Oral     SpO2 05/16/23 0759 96 %     Weight 05/16/23 0807 196 lb (88.9 kg)     Height 05/16/23 0807 5\' 6"  (1.676 m)   Constitutional: Alert and oriented. Well appearing and in no acute distress. Eyes: Conjunctivae are normal.  Head: Atraumatic. Nose: No congestion/rhinnorhea. Mouth/Throat: Mucous membranes are moist.  Neck: No stridor.   Cardiovascular: Normal rate, regular rhythm. Good peripheral circulation. Grossly normal heart sounds.   Respiratory: Normal respiratory effort.  No retractions. Lungs CTAB. Gastrointestinal: Soft and nontender. No distention.  Musculoskeletal: No gross deformities of extremities. Neurologic:  Normal speech and language.  Skin:  Skin is warm and dry. Left flank rash (erythematous base with pustules) noted in a dermatomal  distribution.    ____________________________________________   PROCEDURES  Procedure(s) performed:   Procedures  None  ____________________________________________   INITIAL IMPRESSION / ASSESSMENT AND PLAN / ED COURSE  Pertinent labs & imaging results that were available during my care of the patient were reviewed by me and considered in my medical decision making (see chart for details).   This patient is Presenting for Evaluation of abdominal pain, which does require a range of treatment options, and is a complaint that involves a moderate risk of morbidity and mortality.  The Differential Diagnoses  includes zoster, cellulitis, ureteral stone, acute aortic syndrome, etc.  Medical Decision Making: Summary:  Patient presents emergency department for evaluation of abdominal/back pain which has continued throughout the morning.  It appears that the rash has developed in the left flank which to me seems consistent with zoster.  He was unaware of this during his most recent ED visit.  I suspect his earlier pain symptoms were just prior to the rash developing.  He did have CT imaging which I independently interpreted and agree with radiology interpretation.  Lab work similarly unremarkable from ED visit the small.  Do not plan to repeat these tests.  Plan for antiviral medications along with gabapentin.  We discussed the contagious nature of this rash, to keep it covered, avoid high risk individuals.    Patient's presentation is most consistent with acute, uncomplicated illness.   Disposition: discharge  ____________________________________________  FINAL CLINICAL IMPRESSION(S) / ED DIAGNOSES  Final diagnoses:  Herpes zoster without complication     NEW OUTPATIENT MEDICATIONS STARTED DURING THIS VISIT:  Discharge Medication List as of 05/16/2023  8:29 AM     START taking these medications   Details  acyclovir (ZOVIRAX) 800 MG tablet Take 1 tablet (800 mg total) by mouth 5 (five) times daily for 10 days., Starting Fri 05/16/2023, Until Mon 05/26/2023, Normal    gabapentin (NEURONTIN) 100 MG capsule Take 1 capsule (100 mg total) by mouth 3 (three) times daily as needed (pain)., Starting Fri 05/16/2023, Normal        Note:  This document was prepared using Dragon voice recognition software and may include unintentional dictation errors.  Alona Bene, MD, [facility] Emergency Medicine    Kobyn Kray, Arlyss Repress, MD 05/16/23 [phone]

## 2023-05-16 NOTE — ED Triage Notes (Signed)
Pt having left side and back pain x 5 days.  Pt states he was told it was acid reflux when he here last visit and he wants another opinion.  He believes it is something else.

## 2023-05-16 NOTE — Discharge Instructions (Signed)
You were seen in the emergency department today with shingles.  I suspect this is causing your abdominal back pain.  We are starting you on an antiviral medicine along with some pain medications.  He should not drive while taking gabapentin as it can make you drowsy.  This rash is contagious and he should avoid.  Young children or adults with a weak immune system.  Please keep this covered until the blisters are fully crusted over.

## 2023-05-22 ENCOUNTER — Ambulatory Visit: Payer: Medicare Other | Admitting: Endocrinology

## 2023-05-27 ENCOUNTER — Other Ambulatory Visit: Payer: Self-pay

## 2023-05-27 ENCOUNTER — Emergency Department (HOSPITAL_BASED_OUTPATIENT_CLINIC_OR_DEPARTMENT_OTHER)
Admission: EM | Admit: 2023-05-27 | Discharge: 2023-05-27 | Disposition: A | Discharge status: Home / Self Care | Payer: Medicare Other | Attending: Emergency Medicine | Admitting: Emergency Medicine

## 2023-05-27 ENCOUNTER — Encounter (HOSPITAL_BASED_OUTPATIENT_CLINIC_OR_DEPARTMENT_OTHER): Payer: Self-pay | Admitting: Emergency Medicine

## 2023-05-27 ENCOUNTER — Emergency Department (HOSPITAL_BASED_OUTPATIENT_CLINIC_OR_DEPARTMENT_OTHER): Payer: Medicare Other

## 2023-05-27 DIAGNOSIS — R21 Rash and other nonspecific skin eruption: Secondary | ICD-10-CM | POA: Diagnosis not present

## 2023-05-27 DIAGNOSIS — R531 Weakness: Secondary | ICD-10-CM | POA: Diagnosis not present

## 2023-05-27 DIAGNOSIS — R079 Chest pain, unspecified: Secondary | ICD-10-CM | POA: Diagnosis not present

## 2023-05-27 DIAGNOSIS — Z79899 Other long term (current) drug therapy: Secondary | ICD-10-CM | POA: Insufficient documentation

## 2023-05-27 DIAGNOSIS — R110 Nausea: Secondary | ICD-10-CM | POA: Diagnosis not present

## 2023-05-27 DIAGNOSIS — Z7982 Long term (current) use of aspirin: Secondary | ICD-10-CM | POA: Insufficient documentation

## 2023-05-27 DIAGNOSIS — Z794 Long term (current) use of insulin: Secondary | ICD-10-CM | POA: Insufficient documentation

## 2023-05-27 DIAGNOSIS — Z8551 Personal history of malignant neoplasm of bladder: Secondary | ICD-10-CM | POA: Insufficient documentation

## 2023-05-27 DIAGNOSIS — R109 Unspecified abdominal pain: Secondary | ICD-10-CM | POA: Diagnosis not present

## 2023-05-27 DIAGNOSIS — Z955 Presence of coronary angioplasty implant and graft: Secondary | ICD-10-CM | POA: Diagnosis not present

## 2023-05-27 DIAGNOSIS — E119 Type 2 diabetes mellitus without complications: Secondary | ICD-10-CM | POA: Diagnosis not present

## 2023-05-27 DIAGNOSIS — I129 Hypertensive chronic kidney disease with stage 1 through stage 4 chronic kidney disease, or unspecified chronic kidney disease: Secondary | ICD-10-CM | POA: Insufficient documentation

## 2023-05-27 DIAGNOSIS — M25512 Pain in left shoulder: Secondary | ICD-10-CM

## 2023-05-27 DIAGNOSIS — B029 Zoster without complications: Secondary | ICD-10-CM | POA: Diagnosis not present

## 2023-05-27 DIAGNOSIS — N184 Chronic kidney disease, stage 4 (severe): Secondary | ICD-10-CM | POA: Insufficient documentation

## 2023-05-27 DIAGNOSIS — Z20822 Contact with and (suspected) exposure to covid-19: Secondary | ICD-10-CM | POA: Insufficient documentation

## 2023-05-27 DIAGNOSIS — I2510 Atherosclerotic heart disease of native coronary artery without angina pectoris: Secondary | ICD-10-CM | POA: Diagnosis not present

## 2023-05-27 DIAGNOSIS — R630 Anorexia: Secondary | ICD-10-CM | POA: Insufficient documentation

## 2023-05-27 DIAGNOSIS — Z8546 Personal history of malignant neoplasm of prostate: Secondary | ICD-10-CM | POA: Insufficient documentation

## 2023-05-27 DIAGNOSIS — R112 Nausea with vomiting, unspecified: Secondary | ICD-10-CM | POA: Diagnosis not present

## 2023-05-27 LAB — COMPREHENSIVE METABOLIC PANEL
ALT: 33 U/L (ref 0–44)
AST: 25 U/L (ref 15–41)
Albumin: 3.7 g/dL (ref 3.5–5.0)
Alkaline Phosphatase: 67 U/L (ref 38–126)
Anion gap: 10 (ref 5–15)
BUN: 53 mg/dL — ABNORMAL HIGH (ref 8–23)
CO2: 20 mmol/L — ABNORMAL LOW (ref 22–32)
Calcium: 9.5 mg/dL (ref 8.9–10.3)
Chloride: 106 mmol/L (ref 98–111)
Creatinine, Ser: 3.8 mg/dL — ABNORMAL HIGH (ref 0.61–1.24)
GFR, Estimated: 16 mL/min — ABNORMAL LOW (ref >60)
Glucose, Bld: 161 mg/dL — ABNORMAL HIGH (ref 70–99)
Potassium: 4.7 mmol/L (ref 3.5–5.1)
Sodium: 136 mmol/L (ref 135–145)
Total Bilirubin: 0.8 mg/dL (ref 0.3–1.2)
Total Protein: 7.5 g/dL (ref 6.5–8.1)

## 2023-05-27 LAB — CBG MONITORING, ED: Glucose-Capillary: 147 mg/dL — ABNORMAL HIGH (ref 70–99)

## 2023-05-27 LAB — CBC WITH DIFFERENTIAL/PLATELET
Abs Immature Granulocytes: 0.01 10*3/uL (ref 0.00–0.07)
Basophils Absolute: 0 10*3/uL (ref 0.0–0.1)
Basophils Relative: 1 %
Eosinophils Absolute: 0.2 10*3/uL (ref 0.0–0.5)
Eosinophils Relative: 4 %
HCT: 33.3 % — ABNORMAL LOW (ref 39.0–52.0)
Hemoglobin: 11.6 g/dL — ABNORMAL LOW (ref 13.0–17.0)
Immature Granulocytes: 0 %
Lymphocytes Relative: 23 %
Lymphs Abs: 1 10*3/uL (ref 0.7–4.0)
MCH: 31.4 pg (ref 26.0–34.0)
MCHC: 34.8 g/dL (ref 30.0–36.0)
MCV: 90.2 fL (ref 80.0–100.0)
Monocytes Absolute: 0.4 10*3/uL (ref 0.1–1.0)
Monocytes Relative: 9 %
Neutro Abs: 2.7 10*3/uL (ref 1.7–7.7)
Neutrophils Relative %: 63 %
Platelets: 136 10*3/uL — ABNORMAL LOW (ref 150–400)
RBC: 3.69 MIL/uL — ABNORMAL LOW (ref 4.22–5.81)
RDW: 12.7 % (ref 11.5–15.5)
WBC: 4.3 10*3/uL (ref 4.0–10.5)
nRBC: 0 % (ref 0.0–0.2)

## 2023-05-27 LAB — URINALYSIS, W/ REFLEX TO CULTURE (INFECTION SUSPECTED)
Bilirubin Urine: NEGATIVE
Glucose, UA: NEGATIVE mg/dL
Hgb urine dipstick: NEGATIVE
Ketones, ur: NEGATIVE mg/dL
Leukocytes,Ua: NEGATIVE
Nitrite: NEGATIVE
Protein, ur: 100 mg/dL — AB
Specific Gravity, Urine: 1.025 (ref 1.005–1.030)
pH: 5 (ref 5.0–8.0)

## 2023-05-27 LAB — LIPASE, BLOOD: Lipase: 43 U/L (ref 11–51)

## 2023-05-27 LAB — SARS CORONAVIRUS 2 BY RT PCR: SARS Coronavirus 2 by RT PCR: NEGATIVE

## 2023-05-27 LAB — TROPONIN I (HIGH SENSITIVITY): Troponin I (High Sensitivity): 16 ng/L (ref <18)

## 2023-05-27 MED ORDER — LACTATED RINGERS IV BOLUS
500.0000 mL | Freq: Once | INTRAVENOUS | Status: AC
  Administered 2023-05-27: 500 mL via INTRAVENOUS

## 2023-05-27 MED ORDER — ONDANSETRON 4 MG PO TBDP: 4.0000 mg | ORAL_TABLET | Freq: Three times a day (TID) | ORAL | 0 refills | Status: DC | PRN

## 2023-05-27 MED ORDER — ONDANSETRON HCL 4 MG/2ML IJ SOLN
4.0000 mg | Freq: Once | INTRAMUSCULAR | Status: AC
  Administered 2023-05-27: 4 mg via INTRAVENOUS
  Filled 2023-05-27: qty 2

## 2023-05-27 NOTE — ED Triage Notes (Signed)
Was dx with shingles 2 weeks ago and states  was given meds but states he still feels terrible ,  rash looks better but states he feels bad

## 2023-05-27 NOTE — ED Provider Notes (Signed)
[place] EMERGENCY DEPARTMENT AT [facility] Provider Note   CSN: [id number] Arrival date & time: 05/27/23  5188     History  Chief Complaint  Patient presents with   Rash    Alec [name] is a 77 y.o. male.  HPI      77 year old male with a history of coronary artery disease, CKD stage IV, history of bladder cancer, hypertension, hyperlipidemia, insulin-dependent diabetes, prostate cancer, rheumatoid arthritis, OSA, recent evaluation in the emergency department for left-sided abdominal and back pain for which he had CTs, followed by development of rash consistent with shingles and a repeat visit for which she was prescribed acyclovir and gabapentin, who presents to concern for continued pain related to his shingles, in addition to generalized weakness, nausea, decreased appetite, and 3 days of left shoulder pain and nausea.  Reports he has had continued pain related to the shingles since his diagnosis.  It is located in his abdomen and goes around to his back.  About 3 days ago, he developed pain in a different location, a dull ache around his left shoulder blade.  Denies this pain being worse with deep breaths, denies shortness of breath.  He has had nausea for the past couple days.  Denies vomiting, diarrhea, black or bloody stools.  Reports that with his initial diagnosis he felt generally weak with his shingles and has continued to feel "weak as water".  His appetite has been low.  He has not had any urinary symptoms.   Past Medical History:  Diagnosis Date   Anemia    Anxiety    Aortic insufficiency    Arthritis    Bilateral lower extremity edema    Chronic gout    06-19-2020  per pt last episode 6 months , great toe   CKD (chronic kidney disease), stage IV [facility])    Coronary artery disease cardiologist--- dr hochrein   CABG 2019   Degenerative arthritis of shoulder region 08/2013   left   History of bladder cancer 07/2008   s/p  TURBT   History of  cellulitis 11/2017   left upper arm   History of kidney stones    Hyperlipidemia    Hypertension    followed by pcp/ cardiology   IDDM (insulin dependent diabetes mellitus)    endocrinologist--- dr Lucianne Muss---  pt uses insulin pump and dexcom  (06-19-2020 per pt fasting sugar-- 98--110)   Insulin pump in place    Mitral regurgitation    OSA (obstructive sleep apnea)    Prostate cancer [facility]) urologist--- dr herrick/  oncologist--- manning   dx 11/ 2017  Gleason 3+3 active survillance;  bx 03-14-2020 Gleason 3+4   RA (rheumatoid arthritis) (HCC)    rhemotologist--- dr Marylyn Ishihara---     S/P CABG x 3 07/15/2018   LIMA to LAD;  SVG to PDA;  SVG to RI   Thrombocytopenia (HCC)      Home Medications Prior to Admission medications   Medication Sig Start Date End Date Taking? Authorizing Provider  ondansetron (ZOFRAN-ODT) 4 MG disintegrating tablet Take 1 tablet (4 mg total) by mouth every 8 (eight) hours as needed for nausea or vomiting. 05/27/23  Yes Alvira Monday, MD  allopurinol (ZYLOPRIM) 100 MG tablet Take 0.5 tablets (50 mg total) by mouth daily. 03/19/23 03/13/24  Almon Hercules, MD  aspirin EC 81 MG tablet Take 81 mg by mouth daily.    [provider]  atorvastatin (LIPITOR) 40 MG tablet TAKE 1 TABLET BY MOUTH  EVERY DAY 05/05/23   Rollene Rotunda, MD  Boswellia-Glucosamine-Vit D (OSTEO BI-FLEX ONE PER DAY PO) Take 1 tablet by mouth daily.    [provider]  carvedilol (COREG) 12.5 MG tablet Take 1 tablet (12.5 mg total) by mouth 2 (two) times daily with a meal. 03/19/23   Almon Hercules, MD  Cholecalciferol (VITAMIN D3 PO) Take 1 capsule by mouth daily.    [provider]  CINNAMON PO Take 500 mg by mouth daily.    [provider]  furosemide (LASIX) 20 MG tablet Take 1 tablet (20 mg total) by mouth daily. 03/24/23   Almon Hercules, MD  gabapentin (NEURONTIN) 100 MG capsule Take 1 capsule (100 mg total) by mouth 3 (three) times daily as needed (pain).  05/16/23   Long, Arlyss Repress, MD  hydrALAZINE (APRESOLINE) 100 MG tablet Take 1 tablet (100 mg total) by mouth 3 (three) times daily. 03/29/22   Vassie Loll, MD  isosorbide mononitrate (IMDUR) 120 MG 24 hr tablet TAKE 1 TABLET BY MOUTH EVERY DAY 12/25/22   Rollene Rotunda, MD  latanoprost (XALATAN) 0.005 % ophthalmic solution Place 1 drop into both eyes at bedtime. 09/18/21   [provider]  Multiple Vitamins-Minerals (CENTRUM SILVER PO) Take 1 tablet by mouth daily.    [provider]  nitroGLYCERIN (NITROSTAT) 0.4 MG SL tablet Place 1 tablet (0.4 mg total) under the tongue every 5 (five) minutes as needed for chest pain. 10/26/21 03/14/23  Rollene Rotunda, MD  NOVOLOG 100 UNIT/ML injection USE A MAX OF 85 UNITS DAILY VIA INSULIN PUMP 03/28/23   Reather Littler, MD  olmesartan (BENICAR) 5 MG tablet Take 1 tablet (5 mg total) by mouth daily. 03/24/23   Almon Hercules, MD  omeprazole (PRILOSEC) 20 MG capsule Take 1 capsule (20 mg total) by mouth daily. 05/15/23   Sabas Sous, MD  sertraline (ZOLOFT) 100 MG tablet Take 1 tablet (100 mg total) by mouth daily. 05/31/22   Rollene Rotunda, MD  tamsulosin (FLOMAX) 0.4 MG CAPS capsule Take 0.4 mg by mouth at bedtime.    [provider]      Allergies    Nsaids    Review of Systems   Review of Systems  Physical Exam Updated Vital Signs BP 133/69   Pulse (!) 57   Temp 98.4 F (36.9 C) (Oral)   Resp 15   Ht 5\' 6"  (1.676 m)   Wt 88.9 kg   SpO2 97%   BMI 31.63 kg/m  Physical Exam Vitals and nursing note reviewed.  Constitutional:      General: He is not in acute distress.    Appearance: He is well-developed. He is not diaphoretic.  HENT:     Head: Normocephalic and atraumatic.  Eyes:     Conjunctiva/sclera: Conjunctivae normal.  Cardiovascular:     Rate and Rhythm: Normal rate and regular rhythm.     Heart sounds: Normal heart sounds. No murmur heard.    No friction rub. No gallop.  Pulmonary:     Effort: Pulmonary  effort is normal. No respiratory distress.     Breath sounds: Normal breath sounds. No wheezing or rales.  Abdominal:     General: There is no distension.     Palpations: Abdomen is soft.     Tenderness: There is abdominal tenderness (over areaof rash). There is no guarding.  Musculoskeletal:     Cervical back: Normal range of motion.  Skin:    General: Skin is warm and  dry.     Comments: Shingles rash currently covered in cream, areas observed without cellulitis  Neurological:     Mental Status: He is alert and oriented to person, place, and time.     ED Results / Procedures / Treatments   Labs (all labs ordered are listed, but only abnormal results are displayed) Labs Reviewed  CBC WITH DIFFERENTIAL/PLATELET - Abnormal; Notable for the following components:      Result Value   RBC 3.69 (*)    Hemoglobin 11.6 (*)    HCT 33.3 (*)    Platelets 136 (*)    All other components within normal limits  COMPREHENSIVE METABOLIC PANEL - Abnormal; Notable for the following components:   CO2 20 (*)    Glucose, Bld 161 (*)    BUN 53 (*)    Creatinine, Ser 3.80 (*)    GFR, Estimated 16 (*)    All other components within normal limits  URINALYSIS, W/ REFLEX TO CULTURE (INFECTION SUSPECTED) - Abnormal; Notable for the following components:   Protein, ur 100 (*)    Bacteria, UA RARE (*)    All other components within normal limits  CBG MONITORING, ED - Abnormal; Notable for the following components:   Glucose-Capillary 147 (*)    All other components within normal limits  SARS CORONAVIRUS 2 BY RT PCR  LIPASE, BLOOD  TROPONIN I (HIGH SENSITIVITY)  TROPONIN I (HIGH SENSITIVITY)    EKG EKG Interpretation Date/Time:  Tuesday May 27 2023 11:33:16 EDT Ventricular Rate:  62 PR Interval:  245 QRS Duration:  92 QT Interval:  430 QTC Calculation: 437 R Axis:   72  Text Interpretation: Sinus rhythm Prolonged PR interval Borderline repolarization abnormality No significant change since  last tracing Confirmed by Alvira Monday ([id number]) on 05/27/2023 11:36:14 AM  Radiology DG Chest 2 View  Result Date: 05/27/2023 CLINICAL DATA:  Diagnosed with shingles 2 weeks ago. Chest pain. Rash. EXAM: CHEST - 2 VIEW COMPARISON:  Chest radiographs 12/13/2022 and 03/13/2023 FINDINGS: Status post median sternotomy and CABG. Cardiac silhouette and mediastinal contours are within normal limits. Mild calcification within the aortic arch. The lungs are clear. No pleural effusion or pneumothorax. Mild multilevel degenerative disc changes of the thoracic spine. IMPRESSION: No active cardiopulmonary disease. Electronically Signed   By: Neita Garnet M.D.   On: 05/27/2023 12:37    Procedures Procedures    Medications Ordered in ED Medications  lactated ringers bolus 500 mL (0 mLs Intravenous Stopped 05/27/23 1137)  ondansetron (ZOFRAN) injection 4 mg (4 mg Intravenous Given 05/27/23 1255)    ED Course/ Medical Decision Making/ A&P                                   77 year old male with a history of coronary artery disease, CKD stage IV, history of bladder cancer, hypertension, hyperlipidemia, insulin-dependent diabetes, prostate cancer, rheumatoid arthritis, OSA, recent evaluation in the emergency department for left-sided abdominal and back pain for which he had CTs, followed by development of rash consistent with shingles and a repeat visit for which she was prescribed acyclovir and gabapentin, who presents to concern for continued pain related to his shingles, in addition to generalized weakness, nausea, decreased appetite, and 3 days of left shoulder pain and nausea.  Differential diagnosis includes postherpetic neuralgia secondary to his shingles, with new onset shoulder pain, consider ACS, and consider dehydration in setting of decreased appetite in the setting  of having shingles.  Has normal bilateral upper and lower extremity pulses, low suspicion for aortic dissection.  Reviewed the CT scans  that have been obtained at prior visits.  Labs were obtained and personally about interpreted by me which show no clinically significant anemia or change from prior, no leukocytosis, chronic thrombocytopenia, creatinine of 3.8, which is decreased in comparison to labs in June, slightly increased from August 15.  No transaminitis.  Lipase within normal limits.    EKG completed and personally about interpreted by me shows no clinically significant change in comparison to prior. Troponin checked and negative, doubt ACS.  UA without evidence of infection.  Suspect symptoms secondary to decreased appetite in setting of shingles pain--given 500cc bolus. Recommend continued supportive care,close PCP follow up. Patient discharged in stable condition with understanding of reasons to return.             Final Clinical Impression(s) / ED Diagnoses Final diagnoses:  Generalized weakness  Herpes zoster without complication  Nausea  Acute pain of left shoulder    Rx / DC Orders ED Discharge Orders          Ordered    ondansetron (ZOFRAN-ODT) 4 MG disintegrating tablet  Every 8 hours PRN        05/27/23 1315              Alvira Monday, MD 05/27/23 2221

## 2023-05-28 NOTE — Progress Notes (Unsigned)
Cardiology Clinic Note   Patient Name: Alec [name] Date of Encounter: 05/28/2023  Primary Care Provider:  Lupita Raider, MD Primary Cardiologist:  Rollene Rotunda, MD  Patient Profile    Alec [name] presents to the clinic today for follow-up evaluation of his chronic diastolic CHF, and HTN.  Past Medical History    Past Medical History:  Diagnosis Date   Anemia    Anxiety    Aortic insufficiency    Arthritis    Bilateral lower extremity edema    Chronic gout    06-19-2020  per pt last episode 6 months , great toe   CKD (chronic kidney disease), stage IV [facility])    Coronary artery disease cardiologist--- dr hochrein   CABG 2019   Degenerative arthritis of shoulder region 08/2013   left   History of bladder cancer 07/2008   s/p  TURBT   History of cellulitis 11/2017   left upper arm   History of kidney stones    Hyperlipidemia    Hypertension    followed by pcp/ cardiology   IDDM (insulin dependent diabetes mellitus)    endocrinologist--- dr Lucianne Muss---  pt uses insulin pump and dexcom  (06-19-2020 per pt fasting sugar-- 98--110)   Insulin pump in place    Mitral regurgitation    OSA (obstructive sleep apnea)    Prostate cancer [facility]) urologist--- dr herrick/  oncologist--- man[place]   dx 11/ 2017  Gleason 3+3 active survillance;  bx 03-14-2020 Gleason 3+4   RA (rheumatoid arthritis) (HCC)    rhemotologist--- dr Marylyn Ishihara---     S/P CABG x 3 07/15/2018   LIMA to LAD;  SVG to PDA;  SVG to RI   Thrombocytopenia [facility])    Past Surgical History:  Procedure Laterality Date   CHOLECYSTECTOMY  08/29/2012   Procedure: LAPAROSCOPIC CHOLECYSTECTOMY WITH INTRAOPERATIVE CHOLANGIOGRAM;  Surgeon: Wilmon Arms. Tsuei, MD;  Location: [facility] ORS;  Service: General;  Laterality: N/A;   CORONARY ARTERY BYPASS GRAFT N/A 07/20/2018   Procedure: CORONARY ARTERY BYPASS GRAFTING (CABG) times three using left internal mammary artery to the LAD, and using endoscopically harvested right  saphenous vein to PDA and intermedius.;  Surgeon: Delight Ovens, MD;  Location: [facility] OR;  Service: Open Heart Surgery;  Laterality: N/A;   LEFT HEART CATH AND CORONARY ANGIOGRAPHY N/A 07/15/2018   Procedure: LEFT HEART CATH AND CORONARY ANGIOGRAPHY;  Surgeon: Marykay Lex, MD;  Location: [facility] INVASIVE CV LAB;  Service: Cardiovascular;  Laterality: N/A;   RADIOACTIVE SEED IMPLANT N/A 06/21/2020   Procedure: RADIOACTIVE SEED IMPLANT/BRACHYTHERAPY IMPLANT;  Surgeon: Crist Fat, MD;  Location: [facility];  Service: Urology;  Laterality: N/A;   SHOULDER ARTHROSCOPY W/ ROTATOR CUFF REPAIR Right 09/03/2013   SHOULDER ARTHROSCOPY WITH SUBACROMIAL DECOMPRESSION, ROTATOR CUFF REPAIR AND BICEP TENDON REPAIR Left 09/03/2013   Procedure: LEFT SHOULDER ARTHROSCOPY WITH EXTENSIVE DEBRIDMENT, DISTAL CLAVICULECTOMY, ROTATOR CUFF REPAIR AND SUBACROMIAL DECOMPRESSION PARTIAL ACRIOMIOPLASTY WITH CORACOACROMIAL RELEASE;  Surgeon: Sheral Apley, MD;  Location: [facility];  Service: Orthopedics;  Laterality: Left;   SPACE OAR INSTILLATION N/A 06/21/2020   Procedure: SPACE OAR INSTILLATION;  Surgeon: Crist Fat, MD;  Location: [facility];  Service: Urology;  Laterality: N/A;   TEE WITHOUT CARDIOVERSION N/A 07/20/2018   Procedure: TRANSESOPHAGEAL ECHOCARDIOGRAM (TEE);  Surgeon: Delight Ovens, MD;  Location: [facility] OR;  Service: Open Heart Surgery;  Laterality: N/A;   TRANSURETHRAL RESECTION OF BLADDER TUMOR WITH MITOMYCIN-C  08/24/2008   @  [facility]    Allergies  Allergies  Allergen Reactions   Nsaids Other (See Comments)    Told to avoid by dr     History of Present Illness    Alec [name] is a PMH of HTN, HLD, coronary artery disease status post CABG x3, type 2 diabetes, bradycardia, chronic diastolic CHF, prostate CA, and CKD.  He was admitted to the hospital on 03/27/2022 and discharged on 03/29/2022.  He was diagnosed with acute on chronic  diastolic CHF.  He reported that over the previous 2 weeks he had noticed increasing shortness of breath both at rest and with exertion.  He reported PND.  He denied lower extremity edema.  He denied chest pain.  He did report a productive cough with wheezing.  He was not noted to have fever and reported compliance with his medications.  In the emergency department he was noted to be mildly hypertensive with blood pressure of 160/60.  His BNP was elevated at 1019.  His high-sensitivity troponins were elevated at 19 and 22.  A chest x-ray showed central pulmonary vessel prominence which was consistent with CHF.  His creatinine was 2.46.  His baseline creatinine is somewhere around 1.9.  He received IV furosemide and was transferred to [facility] for further management.  He did not require supplemental oxygen.  He had good urine output.  His creatinine rose to 3.3.  It was recommended that he have a Lasix holiday for suspected overdiuresis.  He was not restarted on olmesartan due to worsening renal function.  Furosemide 20 mg daily was recommended at discharge.  He presented to the clinic 05/07/22 for follow-up evaluation stated he had noticed some dizziness in the mornings over the past 2 weeks.  His blood pressure in clinic was 94/42.  We reviewed his medications.  He and his wife expressed understanding.  We reviewed his recent hospitalization.  He had  weaned himself daily at home.  I had him hold his hydralazine for systolic blood pressure less than 120.  I gave him a salty 6 diet sheet.  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  His creatinine was improving.  We will plan follow-up in 3 to 4 months.  He was seen in follow-up by Dr. Antoine Poche on 09/05/2022.  During that time his weight was stable.  He was rarely taking extra furosemide.  He was following a low-sodium diet.  He was not exercising much.  He denied chest pain, neck and arm discomfort.  He denied lower extremity  swelling.  His blood pressure was 120/60.  Follow-up was planned for 12 months.  He presented to the clinic 10/22/22 for follow-up evaluation and preoperative cardiac evaluation.  He stated he was being evaluated for cataract surgery and noted to have elevated blood pressure.  He reported that his blood pressure was in the 200s systolic.  He was instructed to follow-up with cardiology or present to the emergency department.  He presented to the clinic and was instructed to follow-up in the ED.  He felt that he may have missed his medication prior to his eye surgery visit.  His blood pressure had been well-controlled since that time.  In the clinic  his blood pressure was 130/54.  His pulse was 56.  We reviewed his upcoming cataract surgery.  I  planned follow-up in 12 months.  He was seen in the emergency department on 05/27/2023 with generalized weakness.  He reported continued pain that was  related to shingles since he had been diagnosed.  It was located on his abdomen and wraps around to his back.  He felt a dull ache in his left shoulder blade as well.  His pain was worse with taking deep breaths.  He denied shortness of breath.  He reported continued weakness.  He had a poor appetite.  He denied urinary symptoms.  His blood pressure was noted to be 133/69.  He was noted to have abdominal tenderness over area of his rash.  No cellulitis was noted.  He presents to the clinic today for follow-up evaluation and states***.  Today he denies chest pain, shortness of breath, lower extremity edema, fatigue, palpitations, melena, hematuria, hemoptysis, diaphoresis, weakness, presyncope, syncope, orthopnea, and PND.     Home Medications    Prior to Admission medications   Medication Sig Start Date End Date Taking? Authorizing Provider  allopurinol (ZYLOPRIM) 100 MG tablet Take 200 mg by mouth daily.     [provider]  aspirin EC 81 MG tablet Take 81 mg by mouth daily.    [provider]   atorvastatin (LIPITOR) 40 MG tablet Take 1 tablet (40 mg total) by mouth daily. 11/03/20   Jodelle Gross, NP  Blood Glucose Monitoring Suppl (FREESTYLE FREEDOM LITE) w/Device KIT Use to check blood sugar three times daily. DX:E11.65 Patient not taking: Reported on 04/04/2022 02/02/20   Reather Littler, MD  Boswellia-Glucosamine-Vit D (OSTEO BI-FLEX ONE PER DAY PO) Take 1 tablet by mouth daily.    [provider]  Cholecalciferol (VITAMIN D3 PO) Take 1 capsule by mouth daily.    [provider]  CINNAMON PO Take 500 mg by mouth daily.    [provider]  cloNIDine (CATAPRES) 0.1 MG tablet Take 1 tablet (0.1 mg total) by mouth 2 (two) times daily. 02/28/21   Rollene Rotunda, MD  furosemide (LASIX) 20 MG tablet Take 1 tablet (20 mg total) by mouth daily. 03/30/22 03/30/23  Vassie Loll, MD  hydrALAZINE (APRESOLINE) 100 MG tablet Take 1 tablet (100 mg total) by mouth 3 (three) times daily. Patient not taking: Reported on 04/04/2022 03/29/22   Vassie Loll, MD  insulin aspart (NOVOLOG) 100 UNIT/ML injection Use max of 85 units daily via insulin pump 04/16/22   Reather Littler, MD  isosorbide mononitrate (IMDUR) 120 MG 24 hr tablet Take 1 tablet (120 mg total) by mouth daily. 12/20/21 12/15/22  Rollene Rotunda, MD  latanoprost (XALATAN) 0.005 % ophthalmic solution Place 1 drop into both eyes at bedtime. 09/18/21   [provider]  metoprolol tartrate (LOPRESSOR) 50 MG tablet TAKE 1 TABLET BY MOUTH TWICE A DAY Patient taking differently: Take 50 mg by mouth 2 (two) times daily. 06/11/21   Rollene Rotunda, MD  Misc Natural Products (TART CHERRY ADVANCED PO) Take 1 tablet by mouth daily.    [provider]  Misc Natural Products (TART CHERRY ADVANCED) CAPS Take 1,000 mg by mouth daily in the afternoon.    [provider]  Multiple Vitamins-Minerals (CENTRUM SILVER PO) Take 1 tablet by mouth daily.    [provider]  nitroGLYCERIN (NITROSTAT) 0.4 MG SL tablet  Place 1 tablet (0.4 mg total) under the tongue every 5 (five) minutes as needed for chest pain. Patient not taking: Reported on 04/04/2022 10/26/21 03/27/22  Rollene Rotunda, MD  sertraline (ZOLOFT) 100 MG tablet Take 100 mg by mouth daily. 02/25/20   [provider]  tamsulosin (FLOMAX) 0.4 MG CAPS capsule Take 0.4 mg by mouth  at bedtime.    [provider]    Family History    Family History  Problem Relation Age of Onset   Diabetes Mother    Lung cancer Mother    Cancer Father    Coronary artery disease Father        MI at age 12.    Lung cancer Father    Sudden death Son    CAD Maternal Grandfather        MI in his 26's   CAD Paternal Grandfather    Breast cancer Neg Hx    Colon cancer Neg Hx    Pancreatic cancer Neg Hx    He indicated that his mother is deceased. He indicated that his father is deceased. He indicated that all of his three brothers are alive. He indicated that his maternal grandmother is deceased. He indicated that his maternal grandfather is deceased. He indicated that his paternal grandmother is deceased. He indicated that his paternal grandfather is deceased. He indicated that his daughter is alive. He indicated that his son is deceased. He indicated that the status of his neg hx is unknown.  Social History    Social History   Socioeconomic History   Marital status: Married    Spouse name: Not on file   Number of children: Not on file   Years of education: Not on file   Highest education level: Not on file  Occupational History   Not on file  Tobacco Use   Smoking status: Former    Current packs/day: 0.00    Average packs/day: 2.0 packs/day for 25.0 years (50.0 ttl pk-yrs)    Types: Cigarettes    Start date: 09/11/1972    Quit date: 09/11/1997    Years since quitting: 25.7   Smokeless tobacco: Never  Vaping Use   Vaping status: Never Used  Substance and Sexual Activity   Alcohol use: Not Currently    Comment: >20 years ago, no  heavy use   Drug use: Never   Sexual activity: Yes  Other Topics Concern   Not on file  Social History Narrative   Not on file   Social Determinants of Health   Financial Resource Strain: Not on file  Food Insecurity: Unknown (03/14/2023)   Hunger Vital Sign    Worried About Running Out of Food in the Last Year: Never true    Ran Out of Food in the Last Year: Patient declined  Transportation Needs: No Transportation Needs (03/14/2023)   PRAPARE - Administrator, Civil Service (Medical): No    Lack of Transportation (Non-Medical): No  Physical Activity: Not on file  Stress: Not on file  Social Connections: Not on file  Intimate Partner Violence: Not At Risk (03/14/2023)   Humiliation, Afraid, Rape, and Kick questionnaire    Fear of Current or Ex-Partner: No    Emotionally Abused: No    Physically Abused: No    Sexually Abused: No     Review of Systems    General:  No chills, fever, night sweats or weight changes.  Cardiovascular:  No chest pain, dyspnea on exertion, edema, orthopnea, palpitations, paroxysmal nocturnal dyspnea. Dermatological: No rash, lesions/masses Respiratory: No cough, dyspnea Urologic: No hematuria, dysuria Abdominal:   No nausea, vomiting, diarrhea, bright red blood per rectum, melena, or hematemesis Neurologic:  No visual changes, wkns, changes in mental status. All other systems reviewed and are otherwise negative except as noted above.  Physical Exam    VS:  There were no vitals taken for this visit. , BMI There is no height or weight on file to calculate BMI. GEN: Well nourished, well developed, in no acute distress. HEENT: normal. Neck: Supple, no JVD, carotid bruits, or masses. Cardiac: RRR, no murmurs, rubs, or gallops. No clubbing, cyanosis, edema.  Radials/DP/PT 2+ and equal bilaterally.  Respiratory:  Respirations regular and unlabored, clear to auscultation bilaterally. GI: Soft, nontender, nondistended, BS + x 4. MS: no  deformity or atrophy. Skin: warm and dry, no rash. Neuro:  Strength and sensation are intact. Psych: Normal affect.  Accessory Clinical Findings    Recent Labs: 03/13/2023: B Natriuretic Peptide 2,123.1 03/19/2023: Magnesium 2.2 05/27/2023: ALT 33; BUN 53; Creatinine, Ser 3.80; Hemoglobin 11.6; Platelets 136; Potassium 4.7; Sodium 136   Recent Lipid Panel    Component Value Date/Time   CHOL 102 03/29/2022 0217   CHOL 116 11/03/2020 0908   TRIG 107 03/29/2022 0217   HDL 24 (L) 03/29/2022 0217   HDL 33 (L) 11/03/2020 0908   CHOLHDL 4.3 03/29/2022 0217   VLDL 21 03/29/2022 0217   LDLCALC 57 03/29/2022 0217   LDLCALC 63 11/03/2020 0908    ECG personally reviewed by me today-none today.  Echocardiogram 03/28/2022  IMPRESSIONS     1. Left ventricular ejection fraction, by estimation, is 55 to 60%. The  left ventricle has normal function. The left ventricle has no regional  wall motion abnormalities. Left ventricular diastolic parameters are  consistent with Grade II diastolic  dysfunction (pseudonormalization).   2. Right ventricular systolic function is normal. The right ventricular  size is normal. Tricuspid regurgitation signal is inadequate for assessing  PA pressure.   3. Left atrial size was mildly dilated.   4. The mitral valve is normal in structure. Mild mitral valve  regurgitation. No evidence of mitral stenosis.   5. The aortic valve is tricuspid. Aortic valve regurgitation is trivial.  Aortic valve sclerosis/calcification is present, without any evidence of  aortic stenosis. Aortic valve Vmax measures 1.39 m/s.   6. Pulmonic valve regurgitation is moderate.   7. The inferior vena cava is dilated in size with >50% respiratory  variability, suggesting right atrial pressure of 8 mmHg.   Assessment & Plan   1.  Chronic diastolic CHF-weight stable.  Euvolemic.  Decreased activity related to shingles infection.   Continues to work for [employer] 3 days/week. Continue  Imdur, hydralazine, furosemide Heart healthy low-sodium diet-reviewed Increase physical activity as tolerated Continue daily weights  Coronary artery disease-denies chest pain.  Status post CABG x3 (2019). Continue current medical therapy Heart healthy low-sodium diet  Essential hypertension-BP today 130***/54.  Previously was noted to have elevated blood pressure at the [facility] . Appeared to be in the setting of forgetting to take medication.  Blood pressure has remained stable. Continue hydralazine, clonidine, Imdur, olmesartan Heart healthy low-sodium diet Maintain blood pressure log  CKD-creatinine 2.***74 on 10/10/22.  Baseline creatinine around 1.90. Avoid nephrotoxic agents Follows with nephrology    Disposition: Follow-up with Dr. Antoine Poche in 12 months.  Thomasene Ripple. Netanya Yazdani NP-C     05/28/2023, 5:43 PM [facility] [address] Office [phone] Fax [phone]  Notice: This dictation was prepared with Dragon dictation along with smaller phrase technology. Any transcriptional errors that result from this process are unintentional and may not be corrected upon review.  I spent 1***3 minutes examining this patient, reviewing medications, and using patient centered shared decision making involving her cardiac care.  Prior to her visit I spent greater than 20 minutes reviewing her past medical history,  medications, and prior cardiac tests.

## 2023-05-29 ENCOUNTER — Other Ambulatory Visit: Payer: Self-pay | Admitting: Cardiology

## 2023-05-30 ENCOUNTER — Other Ambulatory Visit: Payer: Self-pay | Admitting: Cardiology

## 2023-06-04 DIAGNOSIS — I2510 Atherosclerotic heart disease of native coronary artery without angina pectoris: Secondary | ICD-10-CM | POA: Diagnosis not present

## 2023-06-04 DIAGNOSIS — E1122 Type 2 diabetes mellitus with diabetic chronic kidney disease: Secondary | ICD-10-CM | POA: Diagnosis not present

## 2023-06-04 DIAGNOSIS — B029 Zoster without complications: Secondary | ICD-10-CM | POA: Diagnosis not present

## 2023-06-04 DIAGNOSIS — N184 Chronic kidney disease, stage 4 (severe): Secondary | ICD-10-CM | POA: Diagnosis not present

## 2023-06-05 ENCOUNTER — Encounter: Payer: Self-pay | Admitting: General Practice

## 2023-06-05 ENCOUNTER — Ambulatory Visit: Payer: Medicare Other | Attending: General Practice | Admitting: General Practice

## 2023-06-05 VITALS — BP 108/64 | HR 59 | Ht 66.0 in | Wt 185.0 lb

## 2023-06-05 DIAGNOSIS — I10 Essential (primary) hypertension: Secondary | ICD-10-CM | POA: Insufficient documentation

## 2023-06-05 DIAGNOSIS — I5032 Chronic diastolic (congestive) heart failure: Secondary | ICD-10-CM | POA: Diagnosis not present

## 2023-06-05 DIAGNOSIS — N184 Chronic kidney disease, stage 4 (severe): Secondary | ICD-10-CM | POA: Insufficient documentation

## 2023-06-05 DIAGNOSIS — I2510 Atherosclerotic heart disease of native coronary artery without angina pectoris: Secondary | ICD-10-CM | POA: Diagnosis not present

## 2023-06-05 NOTE — Patient Instructions (Signed)
Medication Instructions:  The current medical regimen is effective;  continue present plan and medications as directed. Please refer to the Current Medication list given to you today.  *If you need a refill on your cardiac medications before your next appointment, please call your pharmacy*  Lab Work: NONE If you have labs (blood work) drawn today and your tests are completely normal, you will receive your results only by:  MyChart Message (if you have MyChart) OR  A paper copy in the mail If you have any lab test that is abnormal or we need to change your treatment, we will call you to review the results.  Testing/Procedures: NONE  Follow-Up: At [facility], you and your health needs are our priority.  As part of our continuing mission to provide you with exceptional heart care, we have created designated Provider Care Teams.  These Care Teams include your primary Cardiologist (physician) and Advanced Practice Providers (APPs -  Physician Assistants and Nurse Practitioners) who all work together to provide you with the care you need, when you need it.  Your next appointment:   6 month(s)  Provider:   Rollene Rotunda, MD

## 2023-06-11 DIAGNOSIS — N179 Acute kidney failure, unspecified: Secondary | ICD-10-CM | POA: Diagnosis not present

## 2023-07-02 DIAGNOSIS — B0229 Other postherpetic nervous system involvement: Secondary | ICD-10-CM | POA: Diagnosis not present

## 2023-07-02 DIAGNOSIS — R110 Nausea: Secondary | ICD-10-CM | POA: Diagnosis not present

## 2023-07-08 ENCOUNTER — Other Ambulatory Visit: Payer: Medicare Other

## 2023-07-10 ENCOUNTER — Other Ambulatory Visit (INDEPENDENT_AMBULATORY_CARE_PROVIDER_SITE_OTHER): Payer: Medicare Other

## 2023-07-10 DIAGNOSIS — E1165 Type 2 diabetes mellitus with hyperglycemia: Secondary | ICD-10-CM | POA: Diagnosis not present

## 2023-07-10 DIAGNOSIS — Z794 Long term (current) use of insulin: Secondary | ICD-10-CM

## 2023-07-10 LAB — BASIC METABOLIC PANEL
BUN: 30 mg/dL — ABNORMAL HIGH (ref 6–23)
CO2: 26 meq/L (ref 19–32)
Calcium: 9.3 mg/dL (ref 8.4–10.5)
Chloride: 105 meq/L (ref 96–112)
Creatinine, Ser: 2.62 mg/dL — ABNORMAL HIGH (ref 0.40–1.50)
GFR: 23.26 mL/min — ABNORMAL LOW (ref >60.00)
Glucose, Bld: 264 mg/dL — ABNORMAL HIGH (ref 70–99)
Potassium: 4.3 meq/L (ref 3.5–5.1)
Sodium: 137 meq/L (ref 135–145)

## 2023-07-10 LAB — MICROALBUMIN / CREATININE URINE RATIO
Creatinine,U: 150.4 mg/dL
Microalb Creat Ratio: 146.5 mg/g — ABNORMAL HIGH (ref 0.0–30.0)
Microalb, Ur: 220.4 mg/dL — ABNORMAL HIGH (ref 0.0–1.9)

## 2023-07-10 LAB — HEMOGLOBIN A1C: Hgb A1c MFr Bld: 6.2 % (ref 4.6–6.5)

## 2023-07-11 ENCOUNTER — Ambulatory Visit: Payer: Medicare Other | Admitting: Pulmonary Disease

## 2023-07-14 ENCOUNTER — Encounter: Payer: Self-pay | Admitting: Pulmonary Disease

## 2023-07-15 ENCOUNTER — Ambulatory Visit: Payer: Medicare Other | Admitting: Endocrinology

## 2023-07-17 ENCOUNTER — Encounter: Payer: Self-pay | Admitting: Endocrinology

## 2023-07-17 ENCOUNTER — Ambulatory Visit (INDEPENDENT_AMBULATORY_CARE_PROVIDER_SITE_OTHER): Payer: Medicare Other | Admitting: Endocrinology

## 2023-07-17 VITALS — BP 152/70 | HR 62 | Resp 20 | Ht 66.0 in | Wt 188.8 lb

## 2023-07-17 DIAGNOSIS — E1121 Type 2 diabetes mellitus with diabetic nephropathy: Secondary | ICD-10-CM | POA: Diagnosis not present

## 2023-07-17 DIAGNOSIS — E1165 Type 2 diabetes mellitus with hyperglycemia: Secondary | ICD-10-CM

## 2023-07-17 DIAGNOSIS — E118 Type 2 diabetes mellitus with unspecified complications: Secondary | ICD-10-CM | POA: Diagnosis not present

## 2023-07-17 DIAGNOSIS — E1140 Type 2 diabetes mellitus with diabetic neuropathy, unspecified: Secondary | ICD-10-CM | POA: Diagnosis not present

## 2023-07-17 DIAGNOSIS — Z794 Long term (current) use of insulin: Secondary | ICD-10-CM

## 2023-07-17 MED ORDER — INSULIN ASPART 100 UNIT/ML IJ SOLN
INTRAMUSCULAR | 2 refills | Status: DC
Start: 2023-07-17 — End: 2024-05-10

## 2023-07-17 NOTE — Progress Notes (Signed)
Outpatient Endocrinology Note [place] Adaiah Morken, MD  07/19/23  Patient's Name: Alec [name]    DOB: 1946/05/05    MRN: [id number]                                                    REASON OF VISIT: Follow up for type 2 diabetes mellitus  PCP: Lupita Raider, MD  HISTORY OF PRESENT ILLNESS:   Alec [name] is a 77 y.o. old male with past medical history listed below, is here for follow up of type 2 diabetes mellitus.   Pertinent Diabetes History: _Diagnosed as diabetes mellitus  type 2 in 2001.  Chronic Diabetes Complications : Retinopathy: no. Last ophthalmology exam was done on annually.  Nephropathy: yes, on olmesartan, following with nephrology.  Peripheral neuropathy: yes, on gabapentin. Coronary artery disease: no Stroke: no  Relevant comorbidities and cardiovascular risk factors: Obesity: yes Body mass index is 30.47 kg/m.  Hypertension: yes Hyperlipidemia. Yes, on statin  Current / Home Diabetic regimen includes:  T:slim tandem with Dexcom G6 on control IQ  Insulin Pump setting:  Basal MN- 2.0u/hour 2AM- 1.8  7AM- 2.250 4PM- 2.7 7PM-   3.0 10PM- 2.20  Bolus CHO Ratio (1unit:CHO) MN- 1:10  Correction/Sensitivity: MN- 1:30 10PM- 1:35  Target: 110  Active insulin time: 5 hours  Prior diabetic medications: V-go pump. Amaryl, basal bolus insulins.   CONTINUOUS GLUCOSE MONITORING SYSTEM (CGMS) / INSULIN PUMP INTERPRETATION:                         Tandem Pump & Sensor Download (Reviewed and summarized below.) Pump: Dexcom G6 and Tandem t:slim Dates: October 4 to July 17, 2023 for 14 days   Glucose Management Indicator: % Sensor Average: 157 SD 50 CGM/Sensor usage:83% Time in range :70%  Glycemic Trends:  <54: 0.2% 54-70: 0.8% 71-180: 70% 181-250: 24% 251-400: 5%   Average daily carbs entered: 132 Average total daily insulin:  69.17 units, Basal: 69%, Bolus: 31%,  (Manual Bolus: 25%,  Control IQ Bolus: 75%)   Control IQ Time in Use:  76%  Changing cartridge every  2.7  days.   Changing tubing every 2.7 days. Changing site/[place] every 2.7 days.    Trends: Mostly acceptable blood sugar.  Frequent hyperglycemia postprandially related with meals most of the time corrected with control IQ auto boluses, few times meal bolus for the meals about 1-2 times a day.  Hyperglycemia related to late or no  meal manual boluses.  That has been rare mild hypoglycemia related with insulin stacking from control IQ and correctional boluses.  No significant hypoglycemia.  Hypoglycemia: Patient has minor hypoglycemic episodes. Patient has hypoglycemia awareness.    Factors modifying glucose control: 1.  Diabetic diet assessment: 3 meals a day.   2.  Staying active or exercising:   3.  Medication compliance: compliant all of the time.  Interval history 07/17/2023 Pump and CGM data as reviewed above. No complaints.   REVIEW OF SYSTEMS As per history of present illness.   PAST MEDICAL HISTORY: Past Medical History:  Diagnosis Date   Anemia    Anxiety    Aortic insufficiency    Arthritis    Bilateral lower extremity edema    Chronic gout    06-19-2020  per pt last episode 6 months , great  toe   CKD (chronic kidney disease), stage IV [facility])    Coronary artery disease cardiologist--- dr hochrein   CABG 2019   Degenerative arthritis of shoulder region 08/2013   left   History of bladder cancer 07/2008   s/p  TURBT   History of cellulitis 11/2017   left upper arm   History of kidney stones    Hyperlipidemia    Hypertension    followed by pcp/ cardiology   IDDM (insulin dependent diabetes mellitus)    endocrinologist--- dr Lucianne Muss---  pt uses insulin pump and dexcom  (06-19-2020 per pt fasting sugar-- 98--110)   Insulin pump in place    Mitral regurgitation    OSA (obstructive sleep apnea)    Prostate cancer [facility]) urologist--- dr herrick/  oncologist--- [place]   dx 11/ 2017  Gleason 3+3 active survillance;  bx 03-14-2020  Gleason 3+4   RA (rheumatoid arthritis) (HCC)    rhemotologist--- dr Marylyn Ishihara---     S/P CABG x 3 07/15/2018   LIMA to LAD;  SVG to PDA;  SVG to RI   Thrombocytopenia (HCC)     PAST SURGICAL HISTORY: Past Surgical History:  Procedure Laterality Date   CHOLECYSTECTOMY  08/29/2012   Procedure: LAPAROSCOPIC CHOLECYSTECTOMY WITH INTRAOPERATIVE CHOLANGIOGRAM;  Surgeon: Wilmon Arms. Tsuei, MD;  Location: [facility]S;  Service: General;  Laterality: N/A;   CORONARY ARTERY BYPASS GRAFT N/A 07/20/2018   Procedure: CORONARY ARTERY BYPASS GRAFTING (CABG) times three using left internal mammary artery to the LAD, and using endoscopically harvested right saphenous vein to PDA and intermedius.;  Surgeon: Delight Ovens, MD;  Location: [facility] OR;  Service: Open Heart Surgery;  Laterality: N/A;   LEFT HEART CATH AND CORONARY ANGIOGRAPHY N/A 07/15/2018   Procedure: LEFT HEART CATH AND CORONARY ANGIOGRAPHY;  Surgeon: Marykay Lex, MD;  Location: [facility] INVASIVE CV LAB;  Service: Cardiovascular;  Laterality: N/A;   RADIOACTIVE SEED IMPLANT N/A 06/21/2020   Procedure: RADIOACTIVE SEED IMPLANT/BRACHYTHERAPY IMPLANT;  Surgeon: Crist Fat, MD;  Location: [facility];  Service: Urology;  Laterality: N/A;   SHOULDER ARTHROSCOPY W/ ROTATOR CUFF REPAIR Right 09/03/2013   SHOULDER ARTHROSCOPY WITH SUBACROMIAL DECOMPRESSION, ROTATOR CUFF REPAIR AND BICEP TENDON REPAIR Left 09/03/2013   Procedure: LEFT SHOULDER ARTHROSCOPY WITH EXTENSIVE DEBRIDMENT, DISTAL CLAVICULECTOMY, ROTATOR CUFF REPAIR AND SUBACROMIAL DECOMPRESSION PARTIAL ACRIOMIOPLASTY WITH CORACOACROMIAL RELEASE;  Surgeon: Sheral Apley, MD;  Location: [facility];  Service: Orthopedics;  Laterality: Left;   SPACE OAR INSTILLATION N/A 06/21/2020   Procedure: SPACE OAR INSTILLATION;  Surgeon: Crist Fat, MD;  Location: [facility];  Service: Urology;  Laterality: N/A;   TEE WITHOUT CARDIOVERSION N/A  07/20/2018   Procedure: TRANSESOPHAGEAL ECHOCARDIOGRAM (TEE);  Surgeon: Delight Ovens, MD;  Location: [facility] OR;  Service: Open Heart Surgery;  Laterality: N/A;   TRANSURETHRAL RESECTION OF BLADDER TUMOR WITH MITOMYCIN-C  08/24/2008   @[facility]     ALLERGIES: Allergies  Allergen Reactions   Nsaids Other (See Comments)    Told to avoid by dr     FAMILY HISTORY:  Family History  Problem Relation Age of Onset   Diabetes Mother    Lung cancer Mother    Cancer Father    Coronary artery disease Father        MI at age 74.    Lung cancer Father    Sudden death Son    CAD Maternal Grandfather        MI in his 31's  CAD Paternal Grandfather    Breast cancer Neg Hx    Colon cancer Neg Hx    Pancreatic cancer Neg Hx     SOCIAL HISTORY: Social History   Socioeconomic History   Marital status: Married    Spouse name: Not on file   Number of children: Not on file   Years of education: Not on file   Highest education level: Not on file  Occupational History   Not on file  Tobacco Use   Smoking status: Former    Current packs/day: 0.00    Average packs/day: 2.0 packs/day for 25.0 years (50.0 ttl pk-yrs)    Types: Cigarettes    Start date: 09/11/1972    Quit date: 09/11/1997    Years since quitting: 25.8   Smokeless tobacco: Never  Vaping Use   Vaping status: Never Used  Substance and Sexual Activity   Alcohol use: Not Currently    Comment: >20 years ago, no heavy use   Drug use: Never   Sexual activity: Yes  Other Topics Concern   Not on file  Social History Narrative   Not on file   Social Determinants of Health   Financial Resource Strain: Not on file  Food Insecurity: Unknown (03/14/2023)   Hunger Vital Sign    Worried About Running Out of Food in the Last Year: Never true    Ran Out of Food in the Last Year: Patient declined  Transportation Needs: No Transportation Needs (03/14/2023)   PRAPARE - Administrator, Civil Service (Medical): No    Lack of  Transportation (Non-Medical): No  Physical Activity: Not on file  Stress: Not on file  Social Connections: Not on file    MEDICATIONS:  Current Outpatient Medications  Medication Sig Dispense Refill   allopurinol (ZYLOPRIM) 100 MG tablet Take 0.5 tablets (50 mg total) by mouth daily. 45 tablet 3   aspirin EC 81 MG tablet Take 81 mg by mouth daily.     atorvastatin (LIPITOR) 40 MG tablet TAKE 1 TABLET BY MOUTH EVERY DAY 90 tablet 3   Boswellia-Glucosamine-Vit D (OSTEO BI-FLEX ONE PER DAY PO) Take 1 tablet by mouth daily.     carvedilol (COREG) 12.5 MG tablet Take 1 tablet (12.5 mg total) by mouth 2 (two) times daily with a meal. 180 tablet 1   Cholecalciferol (VITAMIN D3 PO) Take 1 capsule by mouth daily.     CINNAMON PO Take 500 mg by mouth daily.     furosemide (LASIX) 20 MG tablet Take 1 tablet (20 mg total) by mouth daily. 90 tablet 3   gabapentin (NEURONTIN) 100 MG capsule Take 1 capsule (100 mg total) by mouth 3 (three) times daily as needed (pain). 20 capsule 0   hydrALAZINE (APRESOLINE) 100 MG tablet Take 1 tablet (100 mg total) by mouth 3 (three) times daily. (Patient taking differently: Take 100 mg by mouth in the morning, at noon, in the evening, and at bedtime.) 90 tablet 2   isosorbide mononitrate (IMDUR) 120 MG 24 hr tablet TAKE 1 TABLET BY MOUTH EVERY DAY 90 tablet 3   latanoprost (XALATAN) 0.005 % ophthalmic solution Place 1 drop into both eyes at bedtime.     metoprolol tartrate (LOPRESSOR) 50 MG tablet Take 50 mg by mouth 2 (two) times daily.     Multiple Vitamins-Minerals (CENTRUM SILVER PO) Take 1 tablet by mouth daily.     olmesartan (BENICAR) 5 MG tablet Take 1 tablet (5 mg total) by mouth daily.  omeprazole (PRILOSEC) 20 MG capsule Take 1 capsule (20 mg total) by mouth daily. 30 capsule 1   ondansetron (ZOFRAN-ODT) 4 MG disintegrating tablet Take 1 tablet (4 mg total) by mouth every 8 (eight) hours as needed for nausea or vomiting. 20 tablet 0   sertraline (ZOLOFT)  100 MG tablet TAKE 1 TABLET BY MOUTH EVERY DAY 90 tablet 3   tamsulosin (FLOMAX) 0.4 MG CAPS capsule Take 0.4 mg by mouth at bedtime.     insulin aspart (NOVOLOG) 100 UNIT/ML injection USE A MAX OF 85 UNITS DAILY VIA INSULIN PUMP 80 mL 2   nitroGLYCERIN (NITROSTAT) 0.4 MG SL tablet Place 1 tablet (0.4 mg total) under the tongue every 5 (five) minutes as needed for chest pain. 30 tablet 1   No current facility-administered medications for this visit.    PHYSICAL EXAM: Vitals:   07/17/23 1429  BP: (!) 152/70  Pulse: 62  Resp: 20  SpO2: 96%  Weight: 188 lb 12.8 oz (85.6 kg)  Height: 5\' 6"  (1.676 m)   Body mass index is 30.47 kg/m.  Wt Readings from Last 3 Encounters:  07/17/23 188 lb 12.8 oz (85.6 kg)  06/05/23 185 lb (83.9 kg)  05/27/23 195 lb 15.8 oz (88.9 kg)    General: Well developed, well nourished male in no apparent distress.  HEENT: AT/[place], no external lesions.  Eyes: Conjunctiva clear and no icterus. Neck: Neck supple  Lungs: Respirations not labored Neurologic: Alert, oriented, normal speech Extremities / Skin: Dry. No sores or rashes noted.  Psychiatric: Does not appear depressed or anxious  Diabetic Foot Exam - Simple   No data filed    LABS Reviewed Lab Results  Component Value Date   HGBA1C 6.2 07/10/2023   HGBA1C 6.6 (H) 02/06/2023   HGBA1C 6.2 10/04/2022   Lab Results  Component Value Date   FRUCTOSAMINE 260 06/12/2022   Lab Results  Component Value Date   CHOL 102 03/29/2022   HDL 24 (L) 03/29/2022   LDLCALC 57 03/29/2022   TRIG 107 03/29/2022   CHOLHDL 4.3 03/29/2022   Lab Results  Component Value Date   MICRALBCREAT 146.5 (H) 07/10/2023   MICRALBCREAT 253.6 (H) 05/25/2021   Lab Results  Component Value Date   CREATININE 2.62 (H) 07/10/2023   Lab Results  Component Value Date   GFR 23.26 (L) 07/10/2023    ASSESSMENT / PLAN  1. Diabetic nephropathy associated with type 2 diabetes mellitus (HCC)   2. Controlled type 2 diabetes  mellitus with complication, with long-term current use of insulin (HCC)   3. Type 2 diabetes mellitus with diabetic neuropathy, with long-term current use of insulin (HCC)     Diabetes Mellitus type 2, complicated by diabetic nephropathy / neuropathy. .  - Diabetic status / severity: controlled.   Lab Results  Component Value Date   HGBA1C 6.2 07/10/2023    - Hemoglobin A1c goal <7%   - Medications: no pump setting change today. Try to bolus for all meals, 10-15 minutes before eating.   - Home glucose testing: continue CGM and check blood glucose as needed.  - Discussed/ Gave Hypoglycemia treatment plan.  # Consult : not required at this time.   # Annual urine for microalbuminuria/ creatinine ratio, no microalbuminuria currently, continue ACE/ARB / olmesartan, following with nephrology.  Last  Lab Results  Component Value Date   MICRALBCREAT 146.5 (H) 07/10/2023    # Foot check nightly / neuropathy, continue gabapentin.   # Annual dilated diabetic eye exams.   -  Diet: Make healthy diabetic food choices - Life style / activity / exercise: discussed.   2. Blood pressure  -  BP Readings from Last 1 Encounters:  07/17/23 (!) 152/70    - Control is not in target.  - No change in current plans.Asked to monitor at home.   3. Lipid status / Hyperlipidemia - Last  Lab Results  Component Value Date   LDLCALC 57 03/29/2022   - Continue atorvastatin 40mg  daily.    Diagnoses and all orders for this visit:  Diabetic nephropathy associated with type 2 diabetes mellitus (HCC)  Controlled type 2 diabetes mellitus with complication, with long-term current use of insulin (HCC) -     insulin aspart (NOVOLOG) 100 UNIT/ML injection; USE A MAX OF 85 UNITS DAILY VIA INSULIN PUMP -     Basic metabolic panel; Future -     Hemoglobin A1c; Future  Type 2 diabetes mellitus with diabetic neuropathy, with long-term current use of insulin (HCC)    DISPOSITION Follow up in clinic in 3   months suggested.   All questions answered and patient verbalized understanding of the plan.  [place] Rishan Oyama, MD [facility] Endocrinology [facility] [address] [place], [address] [place], [place] [address] Phone # [phone]  At least part of this note was generated using voice recognition software. Inadvertent word errors may have occurred, which were not recognized during the proofreading process.

## 2023-07-17 NOTE — Patient Instructions (Signed)
Try to bolus for all meals. No change in pump setting today.

## 2023-07-19 ENCOUNTER — Encounter: Payer: Self-pay | Admitting: Endocrinology

## 2023-07-19 DIAGNOSIS — E1140 Type 2 diabetes mellitus with diabetic neuropathy, unspecified: Secondary | ICD-10-CM | POA: Insufficient documentation

## 2023-07-21 ENCOUNTER — Encounter: Payer: Self-pay | Admitting: Endocrinology

## 2023-07-24 DIAGNOSIS — N184 Chronic kidney disease, stage 4 (severe): Secondary | ICD-10-CM | POA: Diagnosis not present

## 2023-07-25 ENCOUNTER — Ambulatory Visit (INDEPENDENT_AMBULATORY_CARE_PROVIDER_SITE_OTHER): Payer: Medicare Other | Admitting: Podiatry

## 2023-07-25 ENCOUNTER — Encounter: Payer: Self-pay | Admitting: Podiatry

## 2023-07-25 DIAGNOSIS — M79675 Pain in left toe(s): Secondary | ICD-10-CM

## 2023-07-25 DIAGNOSIS — B351 Tinea unguium: Secondary | ICD-10-CM

## 2023-07-25 DIAGNOSIS — L03031 Cellulitis of right toe: Secondary | ICD-10-CM | POA: Diagnosis not present

## 2023-07-25 DIAGNOSIS — E1122 Type 2 diabetes mellitus with diabetic chronic kidney disease: Secondary | ICD-10-CM

## 2023-07-25 DIAGNOSIS — N1832 Chronic kidney disease, stage 3b: Secondary | ICD-10-CM | POA: Diagnosis not present

## 2023-07-25 DIAGNOSIS — Z794 Long term (current) use of insulin: Secondary | ICD-10-CM

## 2023-07-25 DIAGNOSIS — M79674 Pain in right toe(s): Secondary | ICD-10-CM

## 2023-07-25 NOTE — Progress Notes (Signed)
This patient returns to my office for at risk foot care.  This patient requires this care by a professional since this patient will be at risk due to having diabetes.  This patient is unable to cut nails himself since the patient cannot reach his nails.These nails are painful walking and wearing shoes. He says he has developed a red painful area on his right big toe.  He says he cannot touch the site. This patient presents for at risk foot care today.  General Appearance  Alert, conversant and in no acute stress.  Vascular  Dorsalis pedis and posterior tibial  pulses are palpable  bilaterally.  Capillary return is within normal limits  bilaterally. Temperature is within normal limits  bilaterally.  Neurologic  Senn-Weinstein monofilament wire test within normal limits  bilaterally. Muscle power within normal limits bilaterally.  Nails Thick disfigured discolored nails with subungual debris  from hallux to fifth toes bilaterally. No evidence of bacterial infection or drainage bilaterally.  Red swollen right great toe at the distal aspect medial border right hallux.  Orthopedic  No limitations of motion  feet .  No crepitus or effusions noted. HAV  B/L  Skin  normotropic skin with no porokeratosis noted bilaterally.  No signs of infections or ulcers noted.     Onychomycosis  Pain in right toes  Pain in left toes  Paronychia right hallux  Consent was obtained for treatment procedures.   Mechanical debridement of nails 1-5  bilaterally performed with a nail nipper.  Filed with dremel without incident.  Incision and drainage medial border right hallux.  Neosporin/DSD.  Soak in epsom salts at home until redness resolves.   Return office visit   10 weeks                 Told patient to return for periodic foot care and evaluation due to potential at risk complications.   Helane Gunther DPM

## 2023-07-31 DIAGNOSIS — N2581 Secondary hyperparathyroidism of renal origin: Secondary | ICD-10-CM | POA: Diagnosis not present

## 2023-07-31 DIAGNOSIS — D631 Anemia in chronic kidney disease: Secondary | ICD-10-CM | POA: Diagnosis not present

## 2023-07-31 DIAGNOSIS — N184 Chronic kidney disease, stage 4 (severe): Secondary | ICD-10-CM | POA: Diagnosis not present

## 2023-07-31 DIAGNOSIS — I129 Hypertensive chronic kidney disease with stage 1 through stage 4 chronic kidney disease, or unspecified chronic kidney disease: Secondary | ICD-10-CM | POA: Diagnosis not present

## 2023-07-31 DIAGNOSIS — B029 Zoster without complications: Secondary | ICD-10-CM | POA: Diagnosis not present

## 2023-08-09 ENCOUNTER — Other Ambulatory Visit: Payer: Self-pay | Admitting: Cardiology

## 2023-08-14 ENCOUNTER — Other Ambulatory Visit: Payer: Self-pay | Admitting: Cardiology

## 2023-08-14 DIAGNOSIS — E1122 Type 2 diabetes mellitus with diabetic chronic kidney disease: Secondary | ICD-10-CM | POA: Diagnosis not present

## 2023-08-14 DIAGNOSIS — Z8551 Personal history of malignant neoplasm of bladder: Secondary | ICD-10-CM | POA: Diagnosis not present

## 2023-08-14 DIAGNOSIS — I5030 Unspecified diastolic (congestive) heart failure: Secondary | ICD-10-CM | POA: Diagnosis not present

## 2023-08-14 DIAGNOSIS — F411 Generalized anxiety disorder: Secondary | ICD-10-CM | POA: Diagnosis not present

## 2023-08-14 DIAGNOSIS — I130 Hypertensive heart and chronic kidney disease with heart failure and stage 1 through stage 4 chronic kidney disease, or unspecified chronic kidney disease: Secondary | ICD-10-CM | POA: Diagnosis not present

## 2023-08-14 DIAGNOSIS — Z0000 Encounter for general adult medical examination without abnormal findings: Secondary | ICD-10-CM | POA: Diagnosis not present

## 2023-08-14 DIAGNOSIS — N2581 Secondary hyperparathyroidism of renal origin: Secondary | ICD-10-CM | POA: Diagnosis not present

## 2023-08-14 DIAGNOSIS — Z23 Encounter for immunization: Secondary | ICD-10-CM | POA: Diagnosis not present

## 2023-08-14 DIAGNOSIS — E782 Mixed hyperlipidemia: Secondary | ICD-10-CM | POA: Diagnosis not present

## 2023-08-14 DIAGNOSIS — C61 Malignant neoplasm of prostate: Secondary | ICD-10-CM | POA: Diagnosis not present

## 2023-08-14 DIAGNOSIS — N184 Chronic kidney disease, stage 4 (severe): Secondary | ICD-10-CM | POA: Diagnosis not present

## 2023-08-14 DIAGNOSIS — I2510 Atherosclerotic heart disease of native coronary artery without angina pectoris: Secondary | ICD-10-CM | POA: Diagnosis not present

## 2023-09-09 ENCOUNTER — Other Ambulatory Visit: Payer: Self-pay

## 2023-09-09 ENCOUNTER — Emergency Department (HOSPITAL_BASED_OUTPATIENT_CLINIC_OR_DEPARTMENT_OTHER)
Admission: EM | Admit: 2023-09-09 | Discharge: 2023-09-09 | Disposition: A | Discharge status: Home / Self Care | Payer: Medicare Other | Attending: Emergency Medicine | Admitting: Emergency Medicine

## 2023-09-09 ENCOUNTER — Encounter (HOSPITAL_BASED_OUTPATIENT_CLINIC_OR_DEPARTMENT_OTHER): Payer: Self-pay

## 2023-09-09 DIAGNOSIS — Z1152 Encounter for screening for COVID-19: Secondary | ICD-10-CM | POA: Diagnosis not present

## 2023-09-09 DIAGNOSIS — R0981 Nasal congestion: Secondary | ICD-10-CM | POA: Diagnosis present

## 2023-09-09 DIAGNOSIS — Z7982 Long term (current) use of aspirin: Secondary | ICD-10-CM | POA: Insufficient documentation

## 2023-09-09 DIAGNOSIS — Z794 Long term (current) use of insulin: Secondary | ICD-10-CM | POA: Diagnosis not present

## 2023-09-09 DIAGNOSIS — J069 Acute upper respiratory infection, unspecified: Secondary | ICD-10-CM | POA: Insufficient documentation

## 2023-09-09 LAB — RESP PANEL BY RT-PCR (RSV, FLU A&B, COVID)  RVPGX2
Influenza A by PCR: NEGATIVE
Influenza B by PCR: NEGATIVE
Resp Syncytial Virus by PCR: NEGATIVE
SARS Coronavirus 2 by RT PCR: NEGATIVE

## 2023-09-09 MED ORDER — AMOXICILLIN 500 MG PO CAPS
1000.0000 mg | ORAL_CAPSULE | Freq: Once | ORAL | Status: AC
  Administered 2023-09-09: 1000 mg via ORAL
  Filled 2023-09-09: qty 2

## 2023-09-09 MED ORDER — AMOXICILLIN 500 MG PO CAPS: 1000.0000 mg | ORAL_CAPSULE | Freq: Two times a day (BID) | ORAL | 0 refills | Status: DC

## 2023-09-09 NOTE — ED Triage Notes (Signed)
Pt reports nasal congestion and cough x 2 weeks. Denies SOB. States he has had some wheezing.

## 2023-09-09 NOTE — ED Provider Notes (Signed)
[name] Provider Note   CSN: [id number] Arrival date & time: 09/09/23  1725     History  Chief Complaint  Patient presents with   Nasal Congestion    Alec [name] is a 77 y.o. male.  Patient to ED with cough and congestion for the past 2 weeks. No known fever. He reports his cough is productive of white phlegm. No chest pain. No SOB. Cough is worse at night.   The history is provided by the patient. No language interpreter was used.       Home Medications Prior to Admission medications   Medication Sig Start Date End Date Taking? Authorizing Provider  allopurinol (ZYLOPRIM) 100 MG tablet Take 0.5 tablets (50 mg total) by mouth daily. 03/19/23 03/13/24 Yes Almon Hercules, MD  amoxicillin (AMOXIL) 500 MG capsule Take 2 capsules (1,000 mg total) by mouth 2 (two) times daily. 09/09/23  Yes Elpidio Anis, PA-C  aspirin EC 81 MG tablet Take 81 mg by mouth daily.   Yes [provider]  atorvastatin (LIPITOR) 40 MG tablet TAKE 1 TABLET BY MOUTH EVERY DAY 05/05/23  Yes Hochrein, Fayrene Fearing, MD  Boswellia-Glucosamine-Vit D (OSTEO BI-FLEX ONE PER DAY PO) Take 1 tablet by mouth daily.   Yes [provider]  carvedilol (COREG) 12.5 MG tablet Take 1 tablet (12.5 mg total) by mouth 2 (two) times daily with a meal. 03/19/23  Yes Gonfa, Taye T, MD  Cholecalciferol (VITAMIN D3 PO) Take 1 capsule by mouth daily.   Yes [provider]  CINNAMON PO Take 500 mg by mouth daily.   Yes [provider]  furosemide (LASIX) 20 MG tablet TAKE 1 TABLET BY MOUTH EVERY DAY 08/14/23  Yes Almon Hercules, MD  gabapentin (NEURONTIN) 100 MG capsule Take 1 capsule (100 mg total) by mouth 3 (three) times daily as needed (pain). 05/16/23  Yes Long, Arlyss Repress, MD  insulin aspart (NOVOLOG) 100 UNIT/ML injection USE A MAX OF 85 UNITS DAILY VIA INSULIN PUMP 07/17/23  Yes Thapa, [place], MD  isosorbide mononitrate (IMDUR) 120 MG 24 hr tablet TAKE 1  TABLET BY MOUTH EVERY DAY 12/25/22  Yes Rollene Rotunda, MD  metoprolol tartrate (LOPRESSOR) 50 MG tablet TAKE 1 TABLET BY MOUTH TWICE A DAY 08/11/23  Yes Rollene Rotunda, MD  Multiple Vitamins-Minerals (CENTRUM SILVER PO) Take 1 tablet by mouth daily.   Yes [provider]  olmesartan (BENICAR) 5 MG tablet Take 1 tablet (5 mg total) by mouth daily. 03/24/23  Yes Almon Hercules, MD  omeprazole (PRILOSEC) 20 MG capsule Take 1 capsule (20 mg total) by mouth daily. 05/15/23  Yes Sabas Sous, MD  ondansetron (ZOFRAN-ODT) 4 MG disintegrating tablet Take 1 tablet (4 mg total) by mouth every 8 (eight) hours as needed for nausea or vomiting. 05/27/23  Yes Alvira Monday, MD  sertraline (ZOLOFT) 100 MG tablet TAKE 1 TABLET BY MOUTH EVERY DAY 05/30/23  Yes Rollene Rotunda, MD  tamsulosin (FLOMAX) 0.4 MG CAPS capsule Take 0.4 mg by mouth at bedtime.   Yes [provider]  hydrALAZINE (APRESOLINE) 100 MG tablet Take 1 tablet (100 mg total) by mouth 3 (three) times daily. Patient taking differently: Take 100 mg by mouth in the morning, at noon, in the evening, and at bedtime. 03/29/22   Vassie Loll, MD  latanoprost (XALATAN) 0.005 % ophthalmic solution Place 1 drop into both eyes at bedtime. 09/18/21   [provider]  nitroGLYCERIN (NITROSTAT) 0.4 MG  SL tablet Place 1 tablet (0.4 mg total) under the tongue every 5 (five) minutes as needed for chest pain. 10/26/21 03/14/23  Rollene Rotunda, MD      Allergies    Nsaids    Review of Systems   Review of Systems  Physical Exam Updated Vital Signs BP (!) 162/69 (BP Location: Right Arm)   Pulse 67   Temp 98 F (36.7 C)   Resp 18   Ht 5\' 6"  (1.676 m)   Wt 85.7 kg   SpO2 95%   BMI 30.51 kg/m  Physical Exam Vitals and nursing note reviewed.  Constitutional:      Appearance: Normal appearance. He is well-developed.  HENT:     Head: Normocephalic.     Mouth/Throat:     Mouth: Mucous membranes are moist.  Cardiovascular:      Rate and Rhythm: Normal rate and regular rhythm.     Heart sounds: No murmur heard. Pulmonary:     Effort: Pulmonary effort is normal.     Breath sounds: Normal breath sounds. No wheezing, rhonchi or rales.  Abdominal:     General: Bowel sounds are normal.     Palpations: Abdomen is soft.     Tenderness: There is no abdominal tenderness. There is no guarding or rebound.  Musculoskeletal:        General: Normal range of motion.     Cervical back: Normal range of motion and neck supple.  Skin:    General: Skin is warm and dry.     Comments: Resolving rash of shingles to upper left abdomen and corresponding thoracic back.   Neurological:     General: No focal deficit present.     Mental Status: He is alert and oriented to person, place, and time.     ED Results / Procedures / Treatments   Labs (all labs ordered are listed, but only abnormal results are displayed) Labs Reviewed  RESP PANEL BY RT-PCR (RSV, FLU A&B, COVID)  [id number]   Results for orders placed or performed during the hospital encounter of 09/09/23  Resp panel by RT-PCR (RSV, Flu A&B, Covid) Anterior Nasal Swab   Specimen: Anterior Nasal Swab  Result Value Ref Range   SARS Coronavirus 2 by RT PCR NEGATIVE NEGATIVE   Influenza A by PCR NEGATIVE NEGATIVE   Influenza B by PCR NEGATIVE NEGATIVE   Resp Syncytial Virus by PCR NEGATIVE NEGATIVE     EKG None  Radiology No results found.  Procedures Procedures    Medications Ordered in ED Medications  amoxicillin (AMOXIL) capsule 1,000 mg (has no administration in time range)    ED Course/ Medical Decision Making/ A&P Clinical Course as of 09/09/23 1845  Tue Sep 09, 2023  1842 Very well appearing 77 yo patient with ongoing cough for the past 2 weeks. No fever. Viral panel today is negative. Lungs clear. VSS, afebrile. Will start Amoxicillin given duration of symptoms. Recommend follow up with PCP in one week for recheck of symptoms.  [SU]    Clinical Course  User Index [SU] Elpidio Anis, PA-C                                 Medical Decision Making          Final Clinical Impression(s) / ED Diagnoses Final diagnoses:  Upper respiratory tract infection, unspecified type    Rx / DC Orders ED Discharge Orders  Ordered    amoxicillin (AMOXIL) 500 MG capsule  2 times daily        09/09/23 1844              Elpidio Anis, PA-C 09/09/23 1845    Tegeler, Canary Brim, MD 09/09/23 [phone]

## 2023-09-09 NOTE — Discharge Instructions (Signed)
Take Amoxicillin as prescribed. Follow up with your doctor for recheck in one week to ensure you are getting better.   Return to the ED with any new or worsening symptoms.

## 2023-09-18 ENCOUNTER — Ambulatory Visit: Payer: Medicare Other | Admitting: Pulmonary Disease

## 2023-10-03 ENCOUNTER — Ambulatory Visit: Payer: Medicare Other | Admitting: Podiatry

## 2023-10-08 ENCOUNTER — Other Ambulatory Visit: Payer: Self-pay

## 2023-10-08 DIAGNOSIS — E118 Type 2 diabetes mellitus with unspecified complications: Secondary | ICD-10-CM

## 2023-10-16 ENCOUNTER — Other Ambulatory Visit: Payer: Medicare Other

## 2023-10-16 DIAGNOSIS — E118 Type 2 diabetes mellitus with unspecified complications: Secondary | ICD-10-CM | POA: Diagnosis not present

## 2023-10-16 DIAGNOSIS — Z794 Long term (current) use of insulin: Secondary | ICD-10-CM | POA: Diagnosis not present

## 2023-10-17 LAB — BASIC METABOLIC PANEL
BUN/Creatinine Ratio: 12 (calc) (ref 6–22)
BUN: 42 mg/dL — ABNORMAL HIGH (ref 7–25)
CO2: 24 mmol/L (ref 20–32)
Calcium: 9.6 mg/dL (ref 8.6–10.3)
Chloride: 105 mmol/L (ref 98–110)
Creat: 3.58 mg/dL — ABNORMAL HIGH (ref 0.70–1.28)
Glucose, Bld: 136 mg/dL — ABNORMAL HIGH (ref 65–99)
Potassium: 4.3 mmol/L (ref 3.5–5.3)
Sodium: 141 mmol/L (ref 135–146)

## 2023-10-17 LAB — HEMOGLOBIN A1C
Hgb A1c MFr Bld: 6 %{Hb} — ABNORMAL HIGH (ref <5.7)
Mean Plasma Glucose: 126 mg/dL
eAG (mmol/L): 7 mmol/L

## 2023-10-23 ENCOUNTER — Ambulatory Visit (INDEPENDENT_AMBULATORY_CARE_PROVIDER_SITE_OTHER): Payer: Medicare Other | Admitting: Endocrinology

## 2023-10-23 ENCOUNTER — Encounter: Payer: Self-pay | Admitting: Endocrinology

## 2023-10-23 VITALS — BP 118/60 | HR 71 | Resp 20 | Ht 66.0 in | Wt 191.2 lb

## 2023-10-23 DIAGNOSIS — E118 Type 2 diabetes mellitus with unspecified complications: Secondary | ICD-10-CM

## 2023-10-23 DIAGNOSIS — Z794 Long term (current) use of insulin: Secondary | ICD-10-CM | POA: Diagnosis not present

## 2023-10-23 NOTE — Progress Notes (Signed)
Outpatient Endocrinology Note [name], MD  10/23/23  Patient's Name: Alec [name]    DOB: Mar 13, 1946    MRN: [id number]                                                    REASON OF VISIT: Follow up for type 2 diabetes mellitus  PCP: Lupita Raider, MD  HISTORY OF PRESENT ILLNESS:   Alec [name] is a 78 y.o. old male with past medical history listed below, is here for follow up of type 2 diabetes mellitus.   Pertinent Diabetes History: _Diagnosed as diabetes mellitus  type 2 in 2001.  Chronic Diabetes Complications : Retinopathy: no. Last ophthalmology exam was done on annually.  Nephropathy: yes, on olmesartan, following with nephrology.  Peripheral neuropathy: yes, numbness of feet.  Coronary artery disease: no Stroke: no  Relevant comorbidities and cardiovascular risk factors: Obesity: yes Body mass index is 30.86 kg/m.  Hypertension: yes Hyperlipidemia. Yes, on statin  Current / Home Diabetic regimen includes:  T:slim tandem with Dexcom G6 on control IQ, using NovoLog U100  Insulin Pump setting:  Basal MN- 2.0u/hour 2AM- 1.8  7AM- 2.250 4PM- 2.7 7PM-   3.0 10PM- 2.20  Bolus CHO Ratio (1unit:CHO) MN- 1:10  No exact carb counting, he has been using 20-80 carb count based on meal size.  Correction/Sensitivity: MN- 1:30 10PM- 1:35  Target: 110  Active insulin time: 5 hours  Prior diabetic medications:  V-go pump. Amaryl, basal bolus insulins.   CONTINUOUS GLUCOSE MONITORING SYSTEM (CGMS) / INSULIN PUMP INTERPRETATION:                         Tandem Pump & Sensor Download (Reviewed and summarized below.) Pump: Dexcom G6 and Tandem t:slim Dates: January 10 to October 23, 2023 for 14 days   Glucose Management Indicator: 6.9 %  CGM/Sensor usage:100% Time in range :75%    Average daily carbs entered: 185 Average total daily insulin:  76 units, Basal: 67%, Bolus: 33%,  (Manual Bolus: 36%,  Control IQ Bolus: 64%)   Control IQ Time in  Use: 89%      Trends: Frequent mild postprandial hyperglycemia with blood sugar in 200 range mostly related with late meal bolus and occasionally not enough meal bolus.  Blood sugar overnight and in between the meals are acceptable.  No hypoglycemia.  Hypoglycemia: Patient has no hypoglycemic episodes. Patient has hypoglycemia awareness.    Factors modifying glucose control: 1.  Diabetic diet assessment: 3 meals a day.   2.  Staying active or exercising:   3.  Medication compliance: compliant all of the time.  Interval history  Pump and CGM data as reviewed above.  Mostly acceptable blood sugar.  Hemoglobin A1c recently 6.0%.  Occasional numbness of the feet.  Otherwise no complaints today.  REVIEW OF SYSTEMS As per history of present illness.   PAST MEDICAL HISTORY: Past Medical History:  Diagnosis Date   Anemia    Anxiety    Aortic insufficiency    Arthritis    Bilateral lower extremity edema    Chronic gout    06-19-2020  per pt last episode 6 months , great toe   CKD (chronic kidney disease), stage IV [facility])    Coronary artery disease cardiologist--- dr Antoine Poche   CABG 2019  Degenerative arthritis of shoulder region 08/2013   left   History of bladder cancer 07/2008   s/p  TURBT   History of cellulitis 11/2017   left upper arm   History of kidney stones    Hyperlipidemia    Hypertension    followed by pcp/ cardiology   IDDM (insulin dependent diabetes mellitus)    endocrinologist--- dr Lucianne Muss---  pt uses insulin pump and dexcom  (06-19-2020 per pt fasting sugar-- 98--110)   Insulin pump in place    Mitral regurgitation    OSA (obstructive sleep apnea)    Prostate cancer [facility]) urologist--- dr herrick/  oncologist--- [place]   dx 11/ 2017  Gleason 3+3 active survillance;  bx 03-14-2020 Gleason 3+4   RA (rheumatoid arthritis) (HCC)    rhemotologist--- dr Marylyn Ishihara---     S/P CABG x 3 07/15/2018   LIMA to LAD;  SVG to PDA;  SVG to RI   Thrombocytopenia  (HCC)     PAST SURGICAL HISTORY: Past Surgical History:  Procedure Laterality Date   CHOLECYSTECTOMY  08/29/2012   Procedure: LAPAROSCOPIC CHOLECYSTECTOMY WITH INTRAOPERATIVE CHOLANGIOGRAM;  Surgeon: Wilmon Arms. Tsuei, MD;  Location: [facility] ORS;  Service: General;  Laterality: N/A;   CORONARY ARTERY BYPASS GRAFT N/A 07/20/2018   Procedure: CORONARY ARTERY BYPASS GRAFTING (CABG) times three using left internal mammary artery to the LAD, and using endoscopically harvested right saphenous vein to PDA and intermedius.;  Surgeon: Delight Ovens, MD;  Location: [facility] OR;  Service: Open Heart Surgery;  Laterality: N/A;   LEFT HEART CATH AND CORONARY ANGIOGRAPHY N/A 07/15/2018   Procedure: LEFT HEART CATH AND CORONARY ANGIOGRAPHY;  Surgeon: Marykay Lex, MD;  Location: [facility] INVASIVE CV LAB;  Service: Cardiovascular;  Laterality: N/A;   RADIOACTIVE SEED IMPLANT N/A 06/21/2020   Procedure: RADIOACTIVE SEED IMPLANT/BRACHYTHERAPY IMPLANT;  Surgeon: Crist Fat, MD;  Location[facility];  Service: Urology;  Laterality: N/A;   SHOULDER ARTHROSCOPY W/ ROTATOR CUFF REPAIR Right 09/03/2013   SHOULDER ARTHROSCOPY WITH SUBACROMIAL DECOMPRESSION, ROTATOR CUFF REPAIR AND BICEP TENDON REPAIR Left 09/03/2013   Procedure: LEFT SHOULDER ARTHROSCOPY WITH EXTENSIVE DEBRIDMENT, DISTAL CLAVICULECTOMY, ROTATOR CUFF REPAIR AND SUBACROMIAL DECOMPRESSION PARTIAL ACRIOMIOPLASTY WITH CORACOACROMIAL RELEASE;  Surgeon: Sheral Apley, MD;  Location: [facility];  Service: Orthopedics;  Laterality: Left;   SPACE OAR INSTILLATION N/A 06/21/2020   Procedure: SPACE OAR INSTILLATION;  Surgeon: Crist Fat, MD;  Location: [facility];  Service: Urology;  Laterality: N/A;   TEE WITHOUT CARDIOVERSION N/A 07/20/2018   Procedure: TRANSESOPHAGEAL ECHOCARDIOGRAM (TEE);  Surgeon: Delight Ovens, MD;  Location[facility] OR;  Service: Open Heart Surgery;  Laterality: N/A;   TRANSURETHRAL  RESECTION OF BLADDER TUMOR WITH MITOMYCIN-C  08/24/2008   @[facility]     ALLERGIES: Allergies  Allergen Reactions   Nsaids Other (See Comments)    Told to avoid by dr     FAMILY HISTORY:  Family History  Problem Relation Age of Onset   Diabetes Mother    Lung cancer Mother    Cancer Father    Coronary artery disease Father        MI at age 45.    Lung cancer Father    Sudden death Son    CAD Maternal Grandfather        MI in his 40's   CAD Paternal Grandfather    Breast cancer Neg Hx    Colon cancer Neg Hx    Pancreatic cancer Neg Hx  SOCIAL HISTORY: Social History   Socioeconomic History   Marital status: Married    Spouse name: Not on file   Number of children: Not on file   Years of education: Not on file   Highest education level: Not on file  Occupational History   Not on file  Tobacco Use   Smoking status: Former    Current packs/day: 0.00    Average packs/day: 2.0 packs/day for 25.0 years (50.0 ttl pk-yrs)    Types: Cigarettes    Start date: 09/11/1972    Quit date: 09/11/1997    Years since quitting: 26.1   Smokeless tobacco: Never  Vaping Use   Vaping status: Never Used  Substance and Sexual Activity   Alcohol use: Not Currently    Comment: >20 years ago, no heavy use   Drug use: Never   Sexual activity: Yes  Other Topics Concern   Not on file  Social History Narrative   Not on file   Social Drivers of Health   Financial Resource Strain: Not on file  Food Insecurity: Unknown (03/14/2023)   Hunger Vital Sign    Worried About Running Out of Food in the Last Year: Never true    Ran Out of Food in the Last Year: Patient declined  Transportation Needs: No Transportation Needs (03/14/2023)   PRAPARE - Administrator, Civil Service (Medical): No    Lack of Transportation (Non-Medical): No  Physical Activity: Not on file  Stress: Not on file  Social Connections: Not on file    MEDICATIONS:  Current Outpatient Medications  Medication  Sig Dispense Refill   allopurinol (ZYLOPRIM) 100 MG tablet Take 0.5 tablets (50 mg total) by mouth daily. 45 tablet 3   amoxicillin (AMOXIL) 500 MG capsule Take 2 capsules (1,000 mg total) by mouth 2 (two) times daily. 40 capsule 0   aspirin EC 81 MG tablet Take 81 mg by mouth daily.     atorvastatin (LIPITOR) 40 MG tablet TAKE 1 TABLET BY MOUTH EVERY DAY 90 tablet 3   Boswellia-Glucosamine-Vit D (OSTEO BI-FLEX ONE PER DAY PO) Take 1 tablet by mouth daily.     carvedilol (COREG) 12.5 MG tablet Take 1 tablet (12.5 mg total) by mouth 2 (two) times daily with a meal. 180 tablet 1   Cholecalciferol (VITAMIN D3 PO) Take 1 capsule by mouth daily.     CINNAMON PO Take 500 mg by mouth daily.     furosemide (LASIX) 20 MG tablet TAKE 1 TABLET BY MOUTH EVERY DAY 90 tablet 2   gabapentin (NEURONTIN) 100 MG capsule Take 1 capsule (100 mg total) by mouth 3 (three) times daily as needed (pain). 20 capsule 0   hydrALAZINE (APRESOLINE) 100 MG tablet Take 1 tablet (100 mg total) by mouth 3 (three) times daily. (Patient taking differently: Take 100 mg by mouth in the morning, at noon, in the evening, and at bedtime.) 90 tablet 2   insulin aspart (NOVOLOG) 100 UNIT/ML injection USE A MAX OF 85 UNITS DAILY VIA INSULIN PUMP 80 mL 2   isosorbide mononitrate (IMDUR) 120 MG 24 hr tablet TAKE 1 TABLET BY MOUTH EVERY DAY 90 tablet 3   latanoprost (XALATAN) 0.005 % ophthalmic solution Place 1 drop into both eyes at bedtime.     metoprolol tartrate (LOPRESSOR) 50 MG tablet TAKE 1 TABLET BY MOUTH TWICE A DAY 180 tablet 3   Multiple Vitamins-Minerals (CENTRUM SILVER PO) Take 1 tablet by mouth daily.     nitroGLYCERIN (NITROSTAT)  0.4 MG SL tablet Place 1 tablet (0.4 mg total) under the tongue every 5 (five) minutes as needed for chest pain. 30 tablet 1   olmesartan (BENICAR) 5 MG tablet Take 1 tablet (5 mg total) by mouth daily.     omeprazole (PRILOSEC) 20 MG capsule Take 1 capsule (20 mg total) by mouth daily. 30 capsule 1    ondansetron (ZOFRAN-ODT) 4 MG disintegrating tablet Take 1 tablet (4 mg total) by mouth every 8 (eight) hours as needed for nausea or vomiting. 20 tablet 0   sertraline (ZOLOFT) 100 MG tablet TAKE 1 TABLET BY MOUTH EVERY DAY 90 tablet 3   tamsulosin (FLOMAX) 0.4 MG CAPS capsule Take 0.4 mg by mouth at bedtime.     No current facility-administered medications for this visit.    PHYSICAL EXAM: Vitals:   10/23/23 0807  BP: 118/60  Pulse: 71  Resp: 20  SpO2: 96%  Weight: 191 lb 3.2 oz (86.7 kg)  Height: 5\' 6"  (1.676 m)   Body mass index is 30.86 kg/m.  Wt Readings from Last 3 Encounters:  10/23/23 191 lb 3.2 oz (86.7 kg)  09/09/23 189 lb (85.7 kg)  07/17/23 188 lb 12.8 oz (85.6 kg)    General: Well developed, well nourished male in no apparent distress.  HEENT: AT/[place], no external lesions.  Eyes: Conjunctiva clear and no icterus. Neck: Neck supple  Lungs: Respirations not labored Neurologic: Alert, oriented, normal speech Extremities / Skin: Dry. No sores or rashes noted.  Psychiatric: Does not appear depressed or anxious  Diabetic Foot Exam - Simple   Simple Foot Form Diabetic Foot exam was performed with the following findings: Yes 10/23/2023  8:24 AM  Visual Inspection See comments: Yes Sensation Testing Intact to touch and monofilament testing bilaterally: Yes Pulse Check Posterior Tibialis and Dorsalis pulse intact bilaterally: Yes Comments DP palpable bilaterally.  Mild hammer toes on right foot.     LABS Reviewed Lab Results  Component Value Date   HGBA1C 6.0 (H) 10/16/2023   HGBA1C 6.2 07/10/2023   HGBA1C 6.6 (H) 02/06/2023   Lab Results  Component Value Date   FRUCTOSAMINE 260 06/12/2022   Lab Results  Component Value Date   CHOL 102 03/29/2022   HDL 24 (L) 03/29/2022   LDLCALC 57 03/29/2022   TRIG 107 03/29/2022   CHOLHDL 4.3 03/29/2022   Lab Results  Component Value Date   MICRALBCREAT 146.5 (H) 07/10/2023   MICRALBCREAT 253.6 (H) 05/25/2021    Lab Results  Component Value Date   CREATININE 3.58 (H) 10/16/2023   Lab Results  Component Value Date   GFR 23.26 (L) 07/10/2023    ASSESSMENT / PLAN  1. Controlled type 2 diabetes mellitus with complication, with long-term current use of insulin (HCC)      Diabetes Mellitus type 2, complicated by diabetic nephropathy / neuropathy. .  - Diabetic status / severity: controlled.   Lab Results  Component Value Date   HGBA1C 6.0 (H) 10/16/2023    - Hemoglobin A1c goal <7%   Advised to bolus for meal at least before eating or ideally 15 minutes before eating.  Changed carb ratio as follows, due to postprandial hyperglycemia.  - Medications:   T:slim tandem with Dexcom G6 on control IQ, using NovoLog U100  Insulin Pump setting:  Basal MN- 2.0u/hour 2AM- 1.8  7AM- 2.250 4PM- 2.7 7PM-   3.0 10PM- 2.20  Bolus CHO Ratio (1unit:CHO) MN- 1:10, changed to 1 : 8.  No exact carb counting, he  has been using 20-80 carb count based on meal size.  Correction/Sensitivity: MN- 1:30 10PM- 1:35  Target: 110  Active insulin time: 5 hours   - Home glucose testing: continue CGM and check blood glucose as needed.  - Discussed/ Gave Hypoglycemia treatment plan.  # Consult : not required at this time.   # Annual urine for microalbuminuria/ creatinine ratio, no microalbuminuria currently, continue ACE/ARB / olmesartan, following with nephrology.  Last  Lab Results  Component Value Date   MICRALBCREAT 146.5 (H) 07/10/2023    # Foot check nightly / neuropathy.  # Annual dilated diabetic eye exams.   - Diet: Make healthy diabetic food choices - Life style / activity / exercise: discussed.   2. Blood pressure  -  BP Readings from Last 1 Encounters:  10/23/23 118/60    - Control is in target.  - No change in current plans.   3. Lipid status / Hyperlipidemia - Last  Lab Results  Component Value Date   LDLCALC 57 03/29/2022   - Continue atorvastatin 40mg  daily.   Managed by primary care provider.  Diagnoses and all orders for this visit:  Controlled type 2 diabetes mellitus with complication, with long-term current use of insulin (HCC) -     BASIC METABOLIC PANEL WITH GFR -     Hemoglobin A1c   DISPOSITION Follow up in clinic in 3  months suggested.   All questions answered and patient verbalized understanding of the plan.  [place] Aureliano Oshields, MD [facility] [address] [place], [address] [place], [place] [address] Phone # [phone]  At least part of this note was generated using voice recognition software. Inadvertent word errors may have occurred, which were not recognized during the proofreading process.

## 2023-10-24 ENCOUNTER — Encounter: Payer: Self-pay | Admitting: Endocrinology

## 2023-10-24 ENCOUNTER — Encounter: Payer: Self-pay | Admitting: Podiatry

## 2023-10-24 ENCOUNTER — Ambulatory Visit (INDEPENDENT_AMBULATORY_CARE_PROVIDER_SITE_OTHER): Payer: Medicare Other | Admitting: Podiatry

## 2023-10-24 DIAGNOSIS — M79675 Pain in left toe(s): Secondary | ICD-10-CM

## 2023-10-24 DIAGNOSIS — E1122 Type 2 diabetes mellitus with diabetic chronic kidney disease: Secondary | ICD-10-CM

## 2023-10-24 DIAGNOSIS — Z794 Long term (current) use of insulin: Secondary | ICD-10-CM

## 2023-10-24 DIAGNOSIS — N1832 Chronic kidney disease, stage 3b: Secondary | ICD-10-CM

## 2023-10-24 DIAGNOSIS — M79674 Pain in right toe(s): Secondary | ICD-10-CM

## 2023-10-24 DIAGNOSIS — N184 Chronic kidney disease, stage 4 (severe): Secondary | ICD-10-CM | POA: Diagnosis not present

## 2023-10-24 DIAGNOSIS — B351 Tinea unguium: Secondary | ICD-10-CM

## 2023-10-24 NOTE — Progress Notes (Signed)
This patient returns to my office for at risk foot care.  This patient requires this care by a professional since this patient will be at risk due to having diabetes.  This patient is unable to cut nails himself since the patient cannot reach his nails.These nails are painful walking and wearing shoes.  This patient presents for at risk foot care today.  General Appearance  Alert, conversant and in no acute stress.  Vascular  Dorsalis pedis and posterior tibial  pulses are palpable  bilaterally.  Capillary return is within normal limits  bilaterally. Temperature is within normal limits  bilaterally.  Neurologic  Senn-Weinstein monofilament wire test within normal limits  bilaterally. Muscle power within normal limits bilaterally.  Nails Thick disfigured discolored nails with subungual debris  from hallux to fifth toes bilaterally. No evidence of bacterial infection or drainage bilaterally.  Orthopedic  No limitations of motion  feet .  No crepitus or effusions noted.  No bony pathology or digital deformities noted.  Skin  normotropic skin with no porokeratosis noted bilaterally.  No signs of infections or ulcers noted.     Onychomycosis  Pain in right toes  Pain in left toes  Consent was obtained for treatment procedures.   Mechanical debridement of nails 1-5  bilaterally performed with a nail nipper.  Filed with dremel without incident. Wife helped with nail care.   Return office visit   10 weeks                 Told patient to return for periodic foot care and evaluation due to potential at risk complications.   Helane Gunther DPM

## 2023-10-30 ENCOUNTER — Other Ambulatory Visit: Payer: Self-pay | Admitting: Family Medicine

## 2023-10-30 DIAGNOSIS — N2581 Secondary hyperparathyroidism of renal origin: Secondary | ICD-10-CM | POA: Diagnosis not present

## 2023-10-30 DIAGNOSIS — D631 Anemia in chronic kidney disease: Secondary | ICD-10-CM | POA: Diagnosis not present

## 2023-10-30 DIAGNOSIS — R1013 Epigastric pain: Secondary | ICD-10-CM | POA: Diagnosis not present

## 2023-10-30 DIAGNOSIS — I129 Hypertensive chronic kidney disease with stage 1 through stage 4 chronic kidney disease, or unspecified chronic kidney disease: Secondary | ICD-10-CM | POA: Diagnosis not present

## 2023-10-30 DIAGNOSIS — N189 Chronic kidney disease, unspecified: Secondary | ICD-10-CM | POA: Diagnosis not present

## 2023-10-30 DIAGNOSIS — N184 Chronic kidney disease, stage 4 (severe): Secondary | ICD-10-CM | POA: Diagnosis not present

## 2023-10-30 DIAGNOSIS — R197 Diarrhea, unspecified: Secondary | ICD-10-CM | POA: Diagnosis not present

## 2023-11-03 ENCOUNTER — Encounter: Payer: Self-pay | Admitting: Cardiology

## 2023-11-03 NOTE — Telephone Encounter (Signed)
error 

## 2023-12-02 NOTE — Progress Notes (Unsigned)
  Cardiology Office Note:   Date:  12/04/2023  ID:  Alec [name], DOB 1946/03/21, MRN [id number] PCP: Lupita Raider, MD  [facility] Providers Cardiologist:  Rollene Rotunda, MD {  History of Present Illness:   Alec [name] is a 78 y.o. male who presents for follow up of CAD status post CABG.   He was admitted IN 2023 with acute on chronic diastolic heart failure.   Since he was last seen he has done well.  He was in the hospital in August with acute on chronic diastolic heart failure which is compounded by his stage IV kidney disease.  However, since then he has done well.  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  He is not having any new chest pressure, neck or arm discomfort.  He is having no new shortness of breath, PND or orthopnea.  He is had no need for nitroglycerin.  His biggest issue is he has had a severe episode of shingles.  ROS: As stated in the HPI and negative for all other systems.  Studies Reviewed:    EKG:   EKG Interpretation Date/Time:  Thursday December 04 2023 08:45:12 EST Ventricular Rate:  66 PR Interval:  222 QRS Duration:  76 QT Interval:  438 QTC Calculation: 459 R Axis:   32  Text Interpretation: Sinus rhythm with 1st degree A-V block Poor anterior R wave progression Non-specific ST-t changes When compared with ECG of 27-May-2023 11:33, No significant change since last tracing Confirmed by Rollene Rotunda ([id number]) on 12/04/2023 9:08:42 AM     Risk Assessment/Calculations:              Physical Exam:   VS:  BP 138/64 (BP Location: Left Arm, Patient Position: Sitting, Cuff Size: Normal)   Pulse 66   Ht 5\' 6"  (1.676 m)   Wt 190 lb (86.2 kg)   SpO2 99%   BMI 30.67 kg/m    Wt Readings from Last 3 Encounters:  12/04/23 190 lb (86.2 kg)  10/23/23 191 lb 3.2 oz (86.7 kg)  09/09/23 189 lb (85.7 kg)     GEN: Well nourished, well developed in no acute distress NECK: No JVD; No carotid bruits CARDIAC: RRR,  no murmurs, rubs,  gallops RESPIRATORY:  Clear to auscultation without rales, wheezing or rhonchi  ABDOMEN: Soft, non-tender, non-distended EXTREMITIES:  No edema; No deformity   ASSESSMENT AND PLAN:   CHRONIC DIASTOLIC HF: He seems to be euvolemic.  He is watching his salt.  No change in therapy.  CAD:   The patient has no new sypmtoms.  No further cardiovascular testing is indicated.  We will continue with aggressive risk reduction and meds as listed.  HTN:  The blood pressure is at target.  No change in therapy.   HYPERLIPIDEMIA:   LDL was 50 which is improved compared to previous.  This is in November.  He will continue the meds as listed.  DM:   A1C was 6.0 which is down from 6.3.  No change in therapy.    CKD IV:   3.36 was his most recent creatinine and is followed closely by nephrology.  OVERWEIGHT: He is lost weight from his peak and encouraged more of the same.     Follow up with me in one year.   Signed, Rollene Rotunda, MD

## 2023-12-04 ENCOUNTER — Ambulatory Visit: Payer: Medicare Other | Attending: Cardiology | Admitting: Cardiology

## 2023-12-04 ENCOUNTER — Encounter: Payer: Self-pay | Admitting: Cardiology

## 2023-12-04 VITALS — BP 138/64 | HR 66 | Ht 66.0 in | Wt 190.0 lb

## 2023-12-04 DIAGNOSIS — E118 Type 2 diabetes mellitus with unspecified complications: Secondary | ICD-10-CM | POA: Insufficient documentation

## 2023-12-04 DIAGNOSIS — I10 Essential (primary) hypertension: Secondary | ICD-10-CM | POA: Diagnosis not present

## 2023-12-04 DIAGNOSIS — I2510 Atherosclerotic heart disease of native coronary artery without angina pectoris: Secondary | ICD-10-CM | POA: Diagnosis not present

## 2023-12-04 DIAGNOSIS — I5032 Chronic diastolic (congestive) heart failure: Secondary | ICD-10-CM | POA: Diagnosis not present

## 2023-12-04 DIAGNOSIS — Z8546 Personal history of malignant neoplasm of prostate: Secondary | ICD-10-CM | POA: Diagnosis not present

## 2023-12-04 DIAGNOSIS — N184 Chronic kidney disease, stage 4 (severe): Secondary | ICD-10-CM | POA: Insufficient documentation

## 2023-12-04 DIAGNOSIS — C61 Malignant neoplasm of prostate: Secondary | ICD-10-CM | POA: Diagnosis not present

## 2023-12-04 DIAGNOSIS — C678 Malignant neoplasm of overlapping sites of bladder: Secondary | ICD-10-CM | POA: Diagnosis not present

## 2023-12-04 DIAGNOSIS — E785 Hyperlipidemia, unspecified: Secondary | ICD-10-CM | POA: Diagnosis not present

## 2023-12-04 NOTE — Patient Instructions (Signed)

## 2023-12-05 ENCOUNTER — Ambulatory Visit
Admission: RE | Admit: 2023-12-05 | Discharge: 2023-12-05 | Disposition: A | Discharge status: Home / Self Care | Payer: Medicare Other | Source: Ambulatory Visit | Attending: Family Medicine | Admitting: Family Medicine

## 2023-12-05 DIAGNOSIS — R1013 Epigastric pain: Secondary | ICD-10-CM

## 2024-01-05 ENCOUNTER — Other Ambulatory Visit: Payer: Self-pay

## 2024-01-07 ENCOUNTER — Other Ambulatory Visit: Payer: Self-pay | Admitting: Cardiology

## 2024-01-08 DIAGNOSIS — N184 Chronic kidney disease, stage 4 (severe): Secondary | ICD-10-CM | POA: Diagnosis not present

## 2024-01-15 ENCOUNTER — Other Ambulatory Visit: Payer: Medicare Other

## 2024-01-15 DIAGNOSIS — N189 Chronic kidney disease, unspecified: Secondary | ICD-10-CM | POA: Diagnosis not present

## 2024-01-15 DIAGNOSIS — N184 Chronic kidney disease, stage 4 (severe): Secondary | ICD-10-CM | POA: Diagnosis not present

## 2024-01-15 DIAGNOSIS — I129 Hypertensive chronic kidney disease with stage 1 through stage 4 chronic kidney disease, or unspecified chronic kidney disease: Secondary | ICD-10-CM | POA: Diagnosis not present

## 2024-01-15 DIAGNOSIS — Z794 Long term (current) use of insulin: Secondary | ICD-10-CM | POA: Diagnosis not present

## 2024-01-15 DIAGNOSIS — D631 Anemia in chronic kidney disease: Secondary | ICD-10-CM | POA: Diagnosis not present

## 2024-01-15 DIAGNOSIS — N2581 Secondary hyperparathyroidism of renal origin: Secondary | ICD-10-CM | POA: Diagnosis not present

## 2024-01-15 DIAGNOSIS — E118 Type 2 diabetes mellitus with unspecified complications: Secondary | ICD-10-CM | POA: Diagnosis not present

## 2024-01-16 LAB — BASIC METABOLIC PANEL WITH GFR
BUN/Creatinine Ratio: 11 (calc) (ref 6–22)
BUN: 39 mg/dL — ABNORMAL HIGH (ref 7–25)
CO2: 26 mmol/L (ref 20–32)
Calcium: 9.6 mg/dL (ref 8.6–10.3)
Chloride: 107 mmol/L (ref 98–110)
Creat: 3.61 mg/dL — ABNORMAL HIGH (ref 0.70–1.28)
Glucose, Bld: 82 mg/dL (ref 65–99)
Potassium: 4.9 mmol/L (ref 3.5–5.3)
Sodium: 142 mmol/L (ref 135–146)
eGFR: 17 mL/min/{1.73_m2} — ABNORMAL LOW (ref >=60)

## 2024-01-16 LAB — HEMOGLOBIN A1C
Hgb A1c MFr Bld: 6.1 % — ABNORMAL HIGH (ref <5.7)
Mean Plasma Glucose: 128 mg/dL
eAG (mmol/L): 7.1 mmol/L

## 2024-01-22 ENCOUNTER — Encounter: Payer: Self-pay | Admitting: Endocrinology

## 2024-01-22 ENCOUNTER — Ambulatory Visit (INDEPENDENT_AMBULATORY_CARE_PROVIDER_SITE_OTHER): Payer: Medicare Other | Admitting: Endocrinology

## 2024-01-22 VITALS — BP 138/60 | HR 67 | Resp 20 | Ht 66.0 in | Wt 194.6 lb

## 2024-01-22 DIAGNOSIS — Z794 Long term (current) use of insulin: Secondary | ICD-10-CM | POA: Diagnosis not present

## 2024-01-22 DIAGNOSIS — E118 Type 2 diabetes mellitus with unspecified complications: Secondary | ICD-10-CM | POA: Diagnosis not present

## 2024-01-22 NOTE — Progress Notes (Signed)
 Outpatient Endocrinology Note Alec Aryan Sparks, MD  01/22/24  Patient's Name: Alec [name]    DOB: 05-10-1946    MRN: [id number]                                                    REASON OF VISIT: Follow up for type 2 diabetes mellitus  PCP: [name], MD  HISTORY OF PRESENT ILLNESS:   [name] is a 78 y.o. old male with past medical history listed below, is here for follow up of type 2 diabetes mellitus.   Pertinent Diabetes History: _Diagnosed as diabetes mellitus  type 2 in 2001.  Chronic Diabetes Complications : Retinopathy: no. Last ophthalmology exam was done on annually.  Nephropathy: yes, on olmesartan , following with nephrology.  Peripheral neuropathy: yes, numbness of feet.  Coronary artery disease: no Stroke: no  Relevant comorbidities and cardiovascular risk factors: Obesity: yes Body mass index is 31.41 kg/m.  Hypertension: yes Hyperlipidemia. Yes, on statin  Current / Home Diabetic regimen includes:  T:slim tandem with Dexcom G6 on control IQ, using NovoLog  U100  Insulin  Pump setting:  Basal ( total 54.750 units) MN- 2.0u/hour 2AM- 1.8  7AM- 2.250 4PM- 2.7 7PM-   3.0 10PM- 2.20  Bolus CHO Ratio (1unit:CHO) MN- 1:10  No exact carb counting, he has been using 20-80 carb count based on meal size.  Correction/Sensitivity: MN- 1:30 10PM- 1:35  Target: 110  Active insulin  time: 5 hours  Prior diabetic medications:  V-go pump. Amaryl, basal bolus insulins.   CONTINUOUS GLUCOSE MONITORING SYSTEM (CGMS) / INSULIN  PUMP INTERPRETATION:                         Tandem Pump & Sensor Download (Reviewed and summarized below.) Pump: Dexcom G6 and Tandem t:slim Dates: March 28 to April 24 , 2025 for 30 days   Glucose Management Indicator:7%  CGM/Sensor usage:100% Time in range :71%    Average daily carbs entered: 225 Average total daily insulin :  85 units, Basal: 59%, Bolus: 41%,  (Manual Bolus: 38%,  Control IQ Bolus: 62%)   Control  IQ Time in Use: 100%      Trends: Random mild hyperglycemia with blood sugar in 200 mostly related to late meal bolus.  Blood sugar in between the meals and overnight are acceptable.  No concerning hypoglycemia.  One of the time had persistent hyperglycemia with blood sugar in 300 related to site issue/site change timing.  Hypoglycemia: Patient has no hypoglycemic episodes. Patient has hypoglycemia awareness.    Factors modifying glucose control: 1.  Diabetic diet assessment: 3 meals a day.   2.  Staying active or exercising:   3.  Medication compliance: compliant all of the time.  Interval history  Pump and CGM data as reviewed above.  Patient hemoglobin A1c 6.1%.  Normal electrolytes.  He has occasional numbness of the feet otherwise no complaints today.  Note : he mentioned that he had pain and bruises during blood draw at left cubital area, he prefers not to do blood work in our clinic, he prefers to do at [facility] in the future.  He has bruises on the left cubital area.  REVIEW OF SYSTEMS As per history of present illness.   PAST MEDICAL HISTORY: Past Medical History:  Diagnosis Date   Anemia  Anxiety    Aortic insufficiency    Arthritis    Bilateral lower extremity edema    Chronic gout    06-19-2020  per pt last episode 6 months , great toe   CKD (chronic kidney disease), stage IV [facility])    Coronary artery disease cardiologist--- dr hochrein   CABG 2019   Degenerative arthritis of shoulder region 08/2013   left   History of bladder cancer 07/2008   s/p  TURBT   History of cellulitis 11/2017   left upper arm   History of kidney stones    Hyperlipidemia    Hypertension    followed by pcp/ cardiology   IDDM (insulin  dependent diabetes mellitus)    endocrinologist--- [name]---  pt uses insulin  pump and dexcom  (06-19-2020 per pt fasting sugar-- 98--110)   Insulin  pump in place    Mitral regurgitation    OSA (obstructive sleep apnea)    Prostate cancer [facility])  urologist--- dr herrick/  oncologist--- manning   dx 11/ 2017  Gleason 3+3 active survillance;  bx 03-14-2020 Gleason 3+4   RA (rheumatoid arthritis) (HCC)    rhemotologist--- [name]---     S/P CABG x 3 07/15/2018   LIMA to LAD;  SVG to PDA;  SVG to RI   Thrombocytopenia (HCC)     PAST SURGICAL HISTORY: Past Surgical History:  Procedure Laterality Date   CHOLECYSTECTOMY  08/29/2012   Procedure: LAPAROSCOPIC CHOLECYSTECTOMY WITH INTRAOPERATIVE CHOLANGIOGRAM;  Surgeon: [name], MD;  Location: [facility] ORS;  Service: General;  Laterality: N/A;   CORONARY ARTERY BYPASS GRAFT N/A 07/20/2018   Procedure: CORONARY ARTERY BYPASS GRAFTING (CABG) times three using left internal mammary artery to the LAD, and using endoscopically harvested right saphenous vein to PDA and intermedius.;  Surgeon: [name], MD;  Location: [facility] OR;  Service: Open Heart Surgery;  Laterality: N/A;   LEFT HEART CATH AND CORONARY ANGIOGRAPHY N/A 07/15/2018   Procedure: LEFT HEART CATH AND CORONARY ANGIOGRAPHY;  Surgeon: [name], MD;  Location: [facility] INVASIVE CV LAB;  Service: Cardiovascular;  Laterality: N/A;   RADIOACTIVE SEED IMPLANT N/A 06/21/2020   Procedure: RADIOACTIVE SEED IMPLANT/BRACHYTHERAPY IMPLANT;  Surgeon: [name], MD;  Location: [facility];  Service: Urology;  Laterality: N/A;   SHOULDER ARTHROSCOPY W/ ROTATOR CUFF REPAIR Right 09/03/2013   SHOULDER ARTHROSCOPY WITH SUBACROMIAL DECOMPRESSION, ROTATOR CUFF REPAIR AND BICEP TENDON REPAIR Left 09/03/2013   Procedure: LEFT SHOULDER ARTHROSCOPY WITH EXTENSIVE DEBRIDMENT, DISTAL CLAVICULECTOMY, ROTATOR CUFF REPAIR AND SUBACROMIAL DECOMPRESSION PARTIAL ACRIOMIOPLASTY WITH CORACOACROMIAL RELEASE;  Surgeon: [name], MD;  Location: [facility];  Service: Orthopedics;  Laterality: Left;   SPACE OAR INSTILLATION N/A 06/21/2020   Procedure: SPACE OAR INSTILLATION;  Surgeon: [name], MD;   [facility];  Service: Urology;  Laterality: N/A;   TEE WITHOUT CARDIOVERSION N/A 07/20/2018   Procedure: TRANSESOPHAGEAL ECHOCARDIOGRAM (TEE);  Surgeon: [name], MD;  Location: [facility] OR;  Service: Open Heart Surgery;  Laterality: N/A;   TRANSURETHRAL RESECTION OF BLADDER TUMOR WITH MITOMYCIN -C  08/24/2008   @[facility]     ALLERGIES: Allergies  Allergen Reactions   Nsaids Other (See Comments)    Told to avoid by dr     FAMILY HISTORY:  Family History  Problem Relation Age of Onset   Diabetes Mother    Lung cancer Mother    Cancer Father    Coronary artery disease Father  MI at age 66.    Lung cancer Father    Sudden death Son    CAD Maternal Grandfather        MI in his 68's   CAD Paternal Grandfather    Breast cancer Neg Hx    Colon cancer Neg Hx    Pancreatic cancer Neg Hx     SOCIAL HISTORY: Social History   Socioeconomic History   Marital status: Married    Spouse name: Not on file   Number of children: Not on file   Years of education: Not on file   Highest education level: Not on file  Occupational History   Not on file  Tobacco Use   Smoking status: Former    Current packs/day: 0.00    Average packs/day: 2.0 packs/day for 25.0 years (50.0 ttl pk-yrs)    Types: Cigarettes    Start date: 09/11/1972    Quit date: 09/11/1997    Years since quitting: 26.3   Smokeless tobacco: Never  Vaping Use   Vaping status: Never Used  Substance and Sexual Activity   Alcohol use: Not Currently    Comment: >20 years ago, no heavy use   Drug use: Never   Sexual activity: Yes    Partners: Female    Comment: MARRIED  Other Topics Concern   Not on file  Social History Narrative   Not on file   Social Drivers of Health   Financial Resource Strain: Not on file  Food Insecurity: Unknown (03/14/2023)   Hunger Vital Sign    Worried About Running Out of Food in the Last Year: Never true    Ran Out of Food in the Last Year: Patient  declined  Transportation Needs: No Transportation Needs (03/14/2023)   PRAPARE - Administrator, Civil Service (Medical): No    Lack of Transportation (Non-Medical): No  Physical Activity: Not on file  Stress: Not on file  Social Connections: Not on file    MEDICATIONS:  Current Outpatient Medications  Medication Sig Dispense Refill   allopurinol  (ZYLOPRIM ) 100 MG tablet Take 0.5 tablets (50 mg total) by mouth daily. 45 tablet 3   amoxicillin  (AMOXIL ) 500 MG capsule Take 2 capsules (1,000 mg total) by mouth 2 (two) times daily. 40 capsule 0   aspirin  EC 81 MG tablet Take 81 mg by mouth daily.     atorvastatin  (LIPITOR) 40 MG tablet TAKE 1 TABLET BY MOUTH EVERY DAY 90 tablet 3   Boswellia-Glucosamine-Vit D (OSTEO BI-FLEX ONE PER DAY PO) Take 1 tablet by mouth daily.     carvedilol  (COREG ) 12.5 MG tablet Take 1 tablet (12.5 mg total) by mouth 2 (two) times daily with a meal. 180 tablet 1   Cholecalciferol  (VITAMIN D3 PO) Take 1 capsule by mouth daily.     CINNAMON PO Take 500 mg by mouth daily.     furosemide  (LASIX ) 20 MG tablet TAKE 1 TABLET BY MOUTH EVERY DAY 90 tablet 2   gabapentin  (NEURONTIN ) 100 MG capsule Take 1 capsule (100 mg total) by mouth 3 (three) times daily as needed (pain). 20 capsule 0   hydrALAZINE  (APRESOLINE ) 100 MG tablet Take 1 tablet (100 mg total) by mouth 3 (three) times daily. (Patient taking differently: Take 100 mg by mouth in the morning, at noon, in the evening, and at bedtime.) 90 tablet 2   insulin  aspart (NOVOLOG ) 100 UNIT/ML injection USE A MAX OF 85 UNITS DAILY VIA INSULIN  PUMP 80 mL 2   Insulin   Human (INSULIN  PUMP) SOLN Inject into the skin.     isosorbide  mononitrate (IMDUR ) 120 MG 24 hr tablet TAKE 1 TABLET BY MOUTH EVERY DAY 90 tablet 3   latanoprost  (XALATAN ) 0.005 % ophthalmic solution Place 1 drop into both eyes at bedtime.     metoprolol  tartrate (LOPRESSOR ) 50 MG tablet TAKE 1 TABLET BY MOUTH TWICE A DAY 180 tablet 3   Multiple  Vitamins-Minerals (CENTRUM SILVER PO) Take 1 tablet by mouth daily.     nitroGLYCERIN  (NITROSTAT ) 0.4 MG SL tablet Place 1 tablet (0.4 mg total) under the tongue every 5 (five) minutes as needed for chest pain. 30 tablet 1   olmesartan  (BENICAR ) 5 MG tablet Take 1 tablet (5 mg total) by mouth daily.     omeprazole  (PRILOSEC) 20 MG capsule Take 1 capsule (20 mg total) by mouth daily. 30 capsule 1   ondansetron  (ZOFRAN -ODT) 4 MG disintegrating tablet Take 1 tablet (4 mg total) by mouth every 8 (eight) hours as needed for nausea or vomiting. 20 tablet 0   sertraline  (ZOLOFT ) 100 MG tablet TAKE 1 TABLET BY MOUTH EVERY DAY 90 tablet 3   tamsulosin  (FLOMAX ) 0.4 MG CAPS capsule Take 0.4 mg by mouth at bedtime.     No current facility-administered medications for this visit.    PHYSICAL EXAM: Vitals:   01/22/24 0840 01/22/24 0841  BP: (!) 150/60 138/60  Pulse: 67   Resp: 20   SpO2: 98%   Weight: 194 lb 9.6 oz (88.3 kg)   Height: 5\' 6"  (1.676 m)    Body mass index is 31.41 kg/m.  Wt Readings from Last 3 Encounters:  01/22/24 194 lb 9.6 oz (88.3 kg)  12/04/23 190 lb (86.2 kg)  10/23/23 191 lb 3.2 oz (86.7 kg)    General: Well developed, well nourished male in no apparent distress.  HEENT: AT/[place], no external lesions.  Eyes: Conjunctiva clear and no icterus. Neck: Neck supple  Lungs: Respirations not labored Neurologic: Alert, oriented, normal speech Extremities / Skin: Dry.  Multiple bruises on the left cubital area. Psychiatric: Does not appear depressed or anxious  Diabetic Foot Exam - Simple   No data filed    LABS Reviewed Lab Results  Component Value Date   HGBA1C 6.1 (H) 01/15/2024   HGBA1C 6.0 (H) 10/16/2023   HGBA1C 6.2 07/10/2023   Lab Results  Component Value Date   FRUCTOSAMINE 260 06/12/2022   Lab Results  Component Value Date   CHOL 102 03/29/2022   HDL 24 (L) 03/29/2022   LDLCALC 57 03/29/2022   TRIG 107 03/29/2022   CHOLHDL 4.3 03/29/2022   Lab Results   Component Value Date   MICRALBCREAT 146.5 (H) 07/10/2023   MICRALBCREAT 253.6 (H) 05/25/2021   Lab Results  Component Value Date   CREATININE 3.61 (H) 01/15/2024   Lab Results  Component Value Date   GFR 23.26 (L) 07/10/2023    ASSESSMENT / PLAN  1. Controlled type 2 diabetes mellitus with complication, with long-term current use of insulin  (HCC)     Diabetes Mellitus type 2, complicated by diabetic nephropathy / neuropathy. .  - Diabetic status / severity: controlled.   Lab Results  Component Value Date   HGBA1C 6.1 (H) 01/15/2024    - Hemoglobin A1c goal <6.5%   Discussed about meal bolus at least 10 to 15 minutes before eating when possible.  Mild postprandial hyperglycemia related to late meal boluses, otherwise mostly susceptible blood sugar.  - Medications:   T:slim tandem with  Dexcom G6 on control IQ, using NovoLog  U100  No change in the pump setting today.  - Home glucose testing: continue CGM and check blood glucose as needed.  - Discussed/ Gave Hypoglycemia treatment plan.  # Consult : not required at this time.   # Annual urine for microalbuminuria/ creatinine ratio, no microalbuminuria currently, continue ACE/ARB / olmesartan , following with nephrology.  Last  Lab Results  Component Value Date   MICRALBCREAT 146.5 (H) 07/10/2023    # Foot check nightly / neuropathy.  # Annual dilated diabetic eye exams.   - Diet: Make healthy diabetic food choices - Life style / activity / exercise: discussed.   2. Blood pressure  -  BP Readings from Last 1 Encounters:  01/22/24 138/60    - Control is in target.  - No change in current plans.   3. Lipid status / Hyperlipidemia - Last  Lab Results  Component Value Date   LDLCALC 57 03/29/2022   - Continue atorvastatin  40mg  daily.  Managed by primary care provider.  Diagnoses and all orders for this visit:  Controlled type 2 diabetes mellitus with complication, with long-term current use of insulin   (HCC)    DISPOSITION Follow up in clinic in 3  months suggested.  Lab on the same day of the visit will mainly check for hemoglobin A1c at that time.   All questions answered and patient verbalized understanding of the plan.  Alec Ayodeji Keimig, MD [facility] Endocrinology [facility] [address] [place], [address] [place], [place] [address] Phone # [phone]  At least part of this note was generated using voice recognition software. Inadvertent word errors may have occurred, which were not recognized during the proofreading process.

## 2024-01-22 NOTE — Progress Notes (Signed)
 While rooming patient, patient pointed out 3 small to medium bruises for which he stated is result of labs being drawn. Per patient it was painful while being drawn and it left it swollen with bruises to which he states has never happened before. Patient also made mention that he does not like that he has to leave credit card info and sign papers that he will pay in the case his insurance does not. Swelling is no longer present while RN assessed. Patient request administrator be made aware.

## 2024-01-23 ENCOUNTER — Ambulatory Visit (INDEPENDENT_AMBULATORY_CARE_PROVIDER_SITE_OTHER): Payer: Medicare Other | Admitting: Podiatry

## 2024-01-23 ENCOUNTER — Encounter: Payer: Self-pay | Admitting: Podiatry

## 2024-01-23 DIAGNOSIS — E1122 Type 2 diabetes mellitus with diabetic chronic kidney disease: Secondary | ICD-10-CM

## 2024-01-23 DIAGNOSIS — B351 Tinea unguium: Secondary | ICD-10-CM | POA: Diagnosis not present

## 2024-01-23 DIAGNOSIS — M79675 Pain in left toe(s): Secondary | ICD-10-CM | POA: Diagnosis not present

## 2024-01-23 DIAGNOSIS — Z794 Long term (current) use of insulin: Secondary | ICD-10-CM

## 2024-01-23 DIAGNOSIS — M79674 Pain in right toe(s): Secondary | ICD-10-CM | POA: Diagnosis not present

## 2024-01-23 DIAGNOSIS — N1832 Chronic kidney disease, stage 3b: Secondary | ICD-10-CM

## 2024-01-23 NOTE — Progress Notes (Signed)
 This patient returns to my office for at risk foot care.  This patient requires this care by a professional since this patient will be at risk due to having diabetes.  This patient is unable to cut nails himself since the patient cannot reach his nails.These nails are painful walking and wearing shoes.  This patient presents for at risk foot care today.  General Appearance  Alert, conversant and in no acute stress.  Vascular  Dorsalis pedis and posterior tibial  pulses are palpable  bilaterally.  Capillary return is within normal limits  bilaterally. Temperature is within normal limits  bilaterally.  Neurologic  Senn-Weinstein monofilament wire test within normal limits  bilaterally. Muscle power within normal limits bilaterally.  Nails Thick disfigured discolored nails with subungual debris  from hallux to fifth toes bilaterally. No evidence of bacterial infection or drainage bilaterally.  Orthopedic  No limitations of motion  feet .  No crepitus or effusions noted.  No bony pathology or digital deformities noted.  Skin  normotropic skin with no porokeratosis noted bilaterally.  No signs of infections or ulcers noted.     Onychomycosis  Pain in right toes  Pain in left toes  Consent was obtained for treatment procedures.   Mechanical debridement of nails 1-5  bilaterally performed with a nail nipper.  Filed with dremel without incident. Wife helped with nail care.   Return office visit   10 weeks                 Told patient to return for periodic foot care and evaluation due to potential at risk complications.   Helane Gunther DPM

## 2024-01-28 NOTE — Progress Notes (Unsigned)
 Insurance account manager [employer] spoke with patient , issue resolved. Per [facility] lab tech Angie to draw labs going forward.

## 2024-02-09 DIAGNOSIS — H401131 Primary open-angle glaucoma, bilateral, mild stage: Secondary | ICD-10-CM | POA: Diagnosis not present

## 2024-03-04 DIAGNOSIS — H04203 Unspecified epiphora, bilateral lacrimal glands: Secondary | ICD-10-CM | POA: Diagnosis not present

## 2024-03-04 DIAGNOSIS — H401131 Primary open-angle glaucoma, bilateral, mild stage: Secondary | ICD-10-CM | POA: Diagnosis not present

## 2024-03-11 DIAGNOSIS — E113293 Type 2 diabetes mellitus with mild nonproliferative diabetic retinopathy without macular edema, bilateral: Secondary | ICD-10-CM | POA: Diagnosis not present

## 2024-03-11 DIAGNOSIS — H43391 Other vitreous opacities, right eye: Secondary | ICD-10-CM | POA: Diagnosis not present

## 2024-03-19 DIAGNOSIS — K529 Noninfective gastroenteritis and colitis, unspecified: Secondary | ICD-10-CM | POA: Diagnosis not present

## 2024-03-19 DIAGNOSIS — Z860100 Personal history of colon polyps, unspecified: Secondary | ICD-10-CM | POA: Diagnosis not present

## 2024-03-19 DIAGNOSIS — B0229 Other postherpetic nervous system involvement: Secondary | ICD-10-CM | POA: Diagnosis not present

## 2024-04-04 ENCOUNTER — Emergency Department (HOSPITAL_BASED_OUTPATIENT_CLINIC_OR_DEPARTMENT_OTHER)

## 2024-04-04 ENCOUNTER — Other Ambulatory Visit: Payer: Self-pay

## 2024-04-04 ENCOUNTER — Encounter (HOSPITAL_BASED_OUTPATIENT_CLINIC_OR_DEPARTMENT_OTHER): Payer: Self-pay

## 2024-04-04 ENCOUNTER — Emergency Department (HOSPITAL_BASED_OUTPATIENT_CLINIC_OR_DEPARTMENT_OTHER): Admission: EM | Admit: 2024-04-04 | Discharge: 2024-04-04 | Disposition: A | Discharge status: Home / Self Care

## 2024-04-04 DIAGNOSIS — F17210 Nicotine dependence, cigarettes, uncomplicated: Secondary | ICD-10-CM | POA: Insufficient documentation

## 2024-04-04 DIAGNOSIS — R059 Cough, unspecified: Secondary | ICD-10-CM | POA: Insufficient documentation

## 2024-04-04 DIAGNOSIS — Z8551 Personal history of malignant neoplasm of bladder: Secondary | ICD-10-CM | POA: Insufficient documentation

## 2024-04-04 DIAGNOSIS — R809 Proteinuria, unspecified: Secondary | ICD-10-CM | POA: Diagnosis not present

## 2024-04-04 DIAGNOSIS — E1022 Type 1 diabetes mellitus with diabetic chronic kidney disease: Secondary | ICD-10-CM | POA: Diagnosis not present

## 2024-04-04 DIAGNOSIS — J189 Pneumonia, unspecified organism: Secondary | ICD-10-CM | POA: Diagnosis not present

## 2024-04-04 DIAGNOSIS — Z9641 Presence of insulin pump (external) (internal): Secondary | ICD-10-CM | POA: Insufficient documentation

## 2024-04-04 DIAGNOSIS — Z951 Presence of aortocoronary bypass graft: Secondary | ICD-10-CM | POA: Insufficient documentation

## 2024-04-04 DIAGNOSIS — I129 Hypertensive chronic kidney disease with stage 1 through stage 4 chronic kidney disease, or unspecified chronic kidney disease: Secondary | ICD-10-CM | POA: Diagnosis not present

## 2024-04-04 DIAGNOSIS — Z8546 Personal history of malignant neoplasm of prostate: Secondary | ICD-10-CM | POA: Insufficient documentation

## 2024-04-04 DIAGNOSIS — Z794 Long term (current) use of insulin: Secondary | ICD-10-CM | POA: Diagnosis not present

## 2024-04-04 DIAGNOSIS — I2510 Atherosclerotic heart disease of native coronary artery without angina pectoris: Secondary | ICD-10-CM | POA: Diagnosis not present

## 2024-04-04 DIAGNOSIS — R062 Wheezing: Secondary | ICD-10-CM | POA: Diagnosis not present

## 2024-04-04 DIAGNOSIS — N179 Acute kidney failure, unspecified: Secondary | ICD-10-CM | POA: Insufficient documentation

## 2024-04-04 DIAGNOSIS — N189 Chronic kidney disease, unspecified: Secondary | ICD-10-CM

## 2024-04-04 DIAGNOSIS — R918 Other nonspecific abnormal finding of lung field: Secondary | ICD-10-CM | POA: Diagnosis not present

## 2024-04-04 DIAGNOSIS — J984 Other disorders of lung: Secondary | ICD-10-CM | POA: Diagnosis not present

## 2024-04-04 DIAGNOSIS — R109 Unspecified abdominal pain: Secondary | ICD-10-CM | POA: Diagnosis not present

## 2024-04-04 DIAGNOSIS — R9431 Abnormal electrocardiogram [ECG] [EKG]: Secondary | ICD-10-CM | POA: Diagnosis not present

## 2024-04-04 DIAGNOSIS — N184 Chronic kidney disease, stage 4 (severe): Secondary | ICD-10-CM | POA: Insufficient documentation

## 2024-04-04 DIAGNOSIS — Z7982 Long term (current) use of aspirin: Secondary | ICD-10-CM | POA: Insufficient documentation

## 2024-04-04 DIAGNOSIS — N281 Cyst of kidney, acquired: Secondary | ICD-10-CM | POA: Diagnosis not present

## 2024-04-04 LAB — CBC WITH DIFFERENTIAL/PLATELET
Abs Immature Granulocytes: 0.04 K/uL (ref 0.00–0.07)
Basophils Absolute: 0 K/uL (ref 0.0–0.1)
Basophils Relative: 0 %
Eosinophils Absolute: 0.1 K/uL (ref 0.0–0.5)
Eosinophils Relative: 2 %
HCT: 37 % — ABNORMAL LOW (ref 39.0–52.0)
Hemoglobin: 12.6 g/dL — ABNORMAL LOW (ref 13.0–17.0)
Immature Granulocytes: 1 %
Lymphocytes Relative: 15 %
Lymphs Abs: 1.1 K/uL (ref 0.7–4.0)
MCH: 31.7 pg (ref 26.0–34.0)
MCHC: 34.1 g/dL (ref 30.0–36.0)
MCV: 93 fL (ref 80.0–100.0)
Monocytes Absolute: 0.7 K/uL (ref 0.1–1.0)
Monocytes Relative: 10 %
Neutro Abs: 5 K/uL (ref 1.7–7.7)
Neutrophils Relative %: 72 %
Platelets: 110 K/uL — ABNORMAL LOW (ref 150–400)
RBC: 3.98 MIL/uL — ABNORMAL LOW (ref 4.22–5.81)
RDW: 13.4 % (ref 11.5–15.5)
WBC: 6.9 K/uL (ref 4.0–10.5)
nRBC: 0 % (ref 0.0–0.2)

## 2024-04-04 LAB — RESP PANEL BY RT-PCR (RSV, FLU A&B, COVID)  RVPGX2
Influenza A by PCR: NEGATIVE
Influenza B by PCR: NEGATIVE
Resp Syncytial Virus by PCR: NEGATIVE
SARS Coronavirus 2 by RT PCR: NEGATIVE

## 2024-04-04 LAB — COMPREHENSIVE METABOLIC PANEL WITH GFR
ALT: 28 U/L (ref 0–44)
AST: 29 U/L (ref 15–41)
Albumin: 4.1 g/dL (ref 3.5–5.0)
Alkaline Phosphatase: 97 U/L (ref 38–126)
Anion gap: 16 — ABNORMAL HIGH (ref 5–15)
BUN: 55 mg/dL — ABNORMAL HIGH (ref 8–23)
CO2: 21 mmol/L — ABNORMAL LOW (ref 22–32)
Calcium: 9.6 mg/dL (ref 8.9–10.3)
Chloride: 100 mmol/L (ref 98–111)
Creatinine, Ser: 4.38 mg/dL — ABNORMAL HIGH (ref 0.61–1.24)
GFR, Estimated: 13 mL/min — ABNORMAL LOW (ref >60)
Glucose, Bld: 207 mg/dL — ABNORMAL HIGH (ref 70–99)
Potassium: 4.7 mmol/L (ref 3.5–5.1)
Sodium: 136 mmol/L (ref 135–145)
Total Bilirubin: 0.8 mg/dL (ref 0.0–1.2)
Total Protein: 7.6 g/dL (ref 6.5–8.1)

## 2024-04-04 LAB — URINALYSIS, ROUTINE W REFLEX MICROSCOPIC
Bilirubin Urine: NEGATIVE
Glucose, UA: NEGATIVE mg/dL
Hgb urine dipstick: NEGATIVE
Ketones, ur: NEGATIVE mg/dL
Leukocytes,Ua: NEGATIVE
Nitrite: NEGATIVE
Protein, ur: >=300 mg/dL — AB
Specific Gravity, Urine: 1.025 (ref 1.005–1.030)
pH: 5.5 (ref 5.0–8.0)

## 2024-04-04 LAB — URINALYSIS, MICROSCOPIC (REFLEX)

## 2024-04-04 LAB — LIPASE, BLOOD: Lipase: 29 U/L (ref 11–51)

## 2024-04-04 MED ORDER — AZITHROMYCIN 250 MG PO TABS: 250.0000 mg | ORAL_TABLET | Freq: Every day | ORAL | 0 refills | Status: AC

## 2024-04-04 MED ORDER — CEFPODOXIME PROXETIL 200 MG PO TABS: 200.0000 mg | ORAL_TABLET | Freq: Two times a day (BID) | ORAL | 0 refills | Status: DC

## 2024-04-04 MED ORDER — AZITHROMYCIN 250 MG PO TABS
500.0000 mg | ORAL_TABLET | Freq: Once | ORAL | Status: AC
  Administered 2024-04-04: 500 mg via ORAL
  Filled 2024-04-04: qty 2

## 2024-04-04 MED ORDER — ACETAMINOPHEN 325 MG PO TABS
650.0000 mg | ORAL_TABLET | Freq: Once | ORAL | Status: AC
  Administered 2024-04-04: 650 mg via ORAL
  Filled 2024-04-04: qty 2

## 2024-04-04 MED ORDER — CEFPODOXIME PROXETIL 200 MG PO TABS
200.0000 mg | ORAL_TABLET | Freq: Two times a day (BID) | ORAL | 0 refills | Status: AC
Start: 2024-04-04 — End: 2024-04-11

## 2024-04-04 MED ORDER — SODIUM CHLORIDE 0.9 % IV SOLN
1.0000 g | Freq: Once | INTRAVENOUS | Status: AC
  Administered 2024-04-04: 1 g via INTRAVENOUS

## 2024-04-04 NOTE — ED Notes (Signed)
 ED Provider at bedside.

## 2024-04-04 NOTE — Discharge Instructions (Addendum)
 Start your oral antibiotics tomorrow as you have already gotten todays medicine IV. Contact a doctor if: You have a fever. You lose sleep because your cough medicine does not help. Get help right away if: You are short of breath and this gets worse. You have more chest pain. Your sickness gets worse. This is very serious if: You are an older adult. Your body's defense system is weak. You cough up blood. These symptoms may be an emergency. Get help right away. Call 911. Do not wait to see if the symptoms will go away. Do not drive yourself to the hospital.

## 2024-04-04 NOTE — ED Triage Notes (Signed)
 C/o chest congestion, cough, abdominal pain, headache and malodorous urine x 1 week.

## 2024-04-04 NOTE — ED Provider Notes (Signed)
 [place] [facility] Provider Note   CSN: [id number] Arrival date & time: 04/04/24  0904     Patient presents with: Abdominal Pain   Alec [name] is a 78 y.o. male whp  has a past medical history of Anemia, Anxiety, Aortic insufficiency, Arthritis, Bilateral lower extremity edema, Chronic gout, CKD  stage IV , Coronary artery disease, s/p CABG x3,  , History of bladder cancer History of kidney stones,IDDM, HLD, HTN, Insulin  pump in place, Mitral regurgitation, OSA, Prostate cancer RA , and Thrombocytopenia (HCC), former smoker.  Patient presents with several complaints. First, the patient complains of several days of dark-colored urine.  He also noticed a strong odor.  He denies dysuria, frequency, urgency or bladder pain.  He denies flank pain or fevers.  As noted he has a distant history of bladder cancer.  He denies pale stools. 2) patient is also reporting abdominal pain.  He points to the upper middle abdomen and bellybutton region.  He has a history of chronic postherpetic neuralgia on the left side states that the pain he is having is different.  Patient reports that he has been having some difficulty with his stomach for a while.  He is on omeprazole .  He reports that his primary care physician recently sent him for an ultrasound and it did not show anything.  So he is now scheduled to see gastroenterology. 3) the patient has also been experiencing some wheezing.  He began with some nasal congestion and now chest congestion.  He has had a slight cough with an associated headache worsened by coughing.  He has had a little bit of crusting around his lashes.  The patient denies orthopnea, PND, swelling in his lower extremities.  He is not having exertional dyspnea or chest pain.  Patient was concerned because he states that the last 2 times he had wheezing like this he had a pericardial effusion. The patient denies any changes in medications, abdominal  distention, vomiting.  He has had some mild nausea over the past few days.      Abdominal Pain      Prior to Admission medications   Medication Sig Start Date End Date Taking? Authorizing Provider  allopurinol  (ZYLOPRIM ) 100 MG tablet Take 0.5 tablets (50 mg total) by mouth daily. 03/19/23 03/13/24  Gonfa, Taye T, MD  amoxicillin  (AMOXIL ) 500 MG capsule Take 2 capsules (1,000 mg total) by mouth 2 (two) times daily. 09/09/23   Odell Balls, PA-C  aspirin  EC 81 MG tablet Take 81 mg by mouth daily.    [provider]  atorvastatin  (LIPITOR) 40 MG tablet TAKE 1 TABLET BY MOUTH EVERY DAY 05/05/23   Lavona Agent, MD  Boswellia-Glucosamine-Vit D (OSTEO BI-FLEX ONE PER DAY PO) Take 1 tablet by mouth daily.    [provider]  carvedilol  (COREG ) 12.5 MG tablet Take 1 tablet (12.5 mg total) by mouth 2 (two) times daily with a meal. 03/19/23   Kathrin Mignon DASEN, MD  Cholecalciferol  (VITAMIN D3 PO) Take 1 capsule by mouth daily.    [provider]  CINNAMON PO Take 500 mg by mouth daily.    [provider]  furosemide  (LASIX ) 20 MG tablet TAKE 1 TABLET BY MOUTH EVERY DAY 08/14/23   Kathrin Mignon DASEN, MD  gabapentin  (NEURONTIN ) 100 MG capsule Take 1 capsule (100 mg total) by mouth 3 (three) times daily as needed (pain). 05/16/23   Long, Fonda MATSU, MD  hydrALAZINE  (APRESOLINE ) 100 MG tablet  Take 1 tablet (100 mg total) by mouth 3 (three) times daily. Patient taking differently: Take 100 mg by mouth in the morning, at noon, in the evening, and at bedtime. 03/29/22   Ricky Fines, MD  insulin  aspart (NOVOLOG ) 100 UNIT/ML injection USE A MAX OF 85 UNITS DAILY VIA INSULIN  PUMP 07/17/23   Thapa, [place], MD  Insulin  Human (INSULIN  PUMP) SOLN Inject into the skin.    [provider]  isosorbide  mononitrate (IMDUR ) 120 MG 24 hr tablet TAKE 1 TABLET BY MOUTH EVERY DAY 01/07/24   Lavona Agent, MD  latanoprost  (XALATAN ) 0.005 % ophthalmic solution Place 1 drop into both eyes at  bedtime. 09/18/21   [provider]  metoprolol  tartrate (LOPRESSOR ) 50 MG tablet TAKE 1 TABLET BY MOUTH TWICE A DAY 08/11/23   Lavona Agent, MD  Multiple Vitamins-Minerals (CENTRUM SILVER PO) Take 1 tablet by mouth daily.    [provider]  nitroGLYCERIN  (NITROSTAT ) 0.4 MG SL tablet Place 1 tablet (0.4 mg total) under the tongue every 5 (five) minutes as needed for chest pain. 10/26/21 03/14/23  Lavona Agent, MD  olmesartan  (BENICAR ) 5 MG tablet Take 1 tablet (5 mg total) by mouth daily. 03/24/23   Gonfa, Taye T, MD  omeprazole  (PRILOSEC) 20 MG capsule Take 1 capsule (20 mg total) by mouth daily. 05/15/23   Theadore Ozell HERO, MD  ondansetron  (ZOFRAN -ODT) 4 MG disintegrating tablet Take 1 tablet (4 mg total) by mouth every 8 (eight) hours as needed for nausea or vomiting. 05/27/23   Dreama Longs, MD  sertraline  (ZOLOFT ) 100 MG tablet TAKE 1 TABLET BY MOUTH EVERY DAY 05/30/23   Lavona Agent, MD  tamsulosin  (FLOMAX ) 0.4 MG CAPS capsule Take 0.4 mg by mouth at bedtime.    [provider]    Allergies: Nsaids    Review of Systems  Gastrointestinal:  Positive for abdominal pain.    Updated Vital Signs Ht 5' 6 (1.676 m)   Wt 87.1 kg   BMI 30.99 kg/m   Physical Exam Vitals and nursing note reviewed.  Constitutional:      General: He is not in acute distress.    Appearance: He is well-developed. He is not diaphoretic.  HENT:     Head: Normocephalic and atraumatic.  Eyes:     General: No scleral icterus.    Conjunctiva/sclera: Conjunctivae normal.  Cardiovascular:     Rate and Rhythm: Normal rate and regular rhythm.     Heart sounds: Normal heart sounds.  Pulmonary:     Effort: Pulmonary effort is normal. No respiratory distress.     Breath sounds: Normal breath sounds.     Comments: Speaks in full sentences, mild expiratory wheeze Abdominal:     Palpations: Abdomen is soft.     Tenderness: There is abdominal tenderness in the epigastric area,  periumbilical area and left upper quadrant.  Genitourinary:    Testes:        Right: Swelling not present.        Left: Swelling not present.  Musculoskeletal:     Cervical back: Normal range of motion and neck supple.  Skin:    General: Skin is warm and dry.  Neurological:     Mental Status: He is alert.  Psychiatric:        Behavior: Behavior normal.     (all labs ordered are listed, but only abnormal results are displayed) Labs Reviewed - No data to display  EKG: EKG Interpretation Date/Time:  Sunday April 04 2024 09:12:04  EDT Ventricular Rate:  80 PR Interval:  199 QRS Duration:  96 QT Interval:  397 QTC Calculation: 458 R Axis:   85  Text Interpretation: Sinus rhythm Multiple ventricular premature complexes Borderline right axis deviation Nonspecific T abnormalities, lateral leads Confirmed by Neysa Clap [id number]) on 04/04/2024 9:36:08 AM  Radiology: No results found.   Ultrasound ED Echo  Date/Time: 04/04/2024 10:09 AM  Performed by: Arloa Chroman, PA-C Authorized by: Arloa Chroman, PA-C   Procedure details:    Indications: dyspnea     Views: subxiphoid, parasternal long axis view and apical 4 chamber view     Images: archived     Limitations:  Acoustic shadowing Findings:    Pericardium: no pericardial effusion   Impression:    Impression: normal      Medications Ordered in the ED - No data to display  Clinical Course as of 04/07/24 1459  Sun Apr 04, 2024  1010 Given the patient's history of and concern for potential pericardial effusion I did a bedside cardiac ultrasound.  The heart appears to be dynamic, with normal squeeze.  There is no obvious signs of pericardial effusion.  Images documented. [AH]  1104 Protein(!): >=300 [AH]  1148 Creatinine(!): 4.38 Baseline is 3.5-3.6 [AH]  1410 Cse discussed with Dr. Dolan  [AH]    Clinical Course User Index [AH] Arloa Chroman, PA-C                                 Medical Decision Making Amount  and/or Complexity of Data Reviewed Labs: ordered. Decision-making details documented in ED Course. Radiology: ordered.  Risk OTC drugs. Prescription drug management.   This patient presents to the ED for concern of multiple complaint, primarily cough and wheezing, this involves an extensive number of treatment options, and is a complaint that carries with it a high risk of complications and morbidity.  Differential diagnosis for emergent cause of cough includes but is not limited to upper respiratory infection, lower respiratory infection, allergies, asthma, irritants, foreign body, medications such as ACE inhibitors, reflux, asthma, CHF, lung cancer, interstitial lung disease, psychiatric causes, postnasal drip and postinfectious bronchospasm.   Co morbidities:   has a past medical history of Anemia, Anxiety, Aortic insufficiency, Arthritis, Bilateral lower extremity edema, Chronic gout, CKD (chronic kidney disease), stage IV (HCC), Coronary artery disease (cardiologist--- dr lavona), Degenerative arthritis of shoulder region (08/2013), History of bladder cancer (07/2008), History of cellulitis (11/2017), History of kidney stones, Hyperlipidemia, Hypertension, IDDM (insulin  dependent diabetes mellitus), Insulin  pump in place, Mitral regurgitation, OSA (obstructive sleep apnea), Prostate cancer [facility]) (urologist--- dr herrick/  oncologist--- manning), RA (rheumatoid arthritis) (HCC), S/P CABG x 3 (07/15/2018), and Thrombocytopenia (HCC).   Social Determinants of Health:   SDOH Screenings   Food Insecurity: Unknown (03/14/2023)  Housing: Low Risk  (03/14/2023)  Transportation Needs: No Transportation Needs (03/14/2023)  Utilities: Not At Risk (03/14/2023)  Depression (PHQ2-9): Low Risk  (02/27/2023)  Tobacco Use: Medium Risk (04/04/2024)     Additional history:  {Additional history obtained from wife at bedside Records reviewed including previous admission documents  Lab Tests:  I  Ordered, and personally interpreted labs.  The pertinent results include:   Labs with elevated urine protein, acute on chronic kidney disease with creatinine of 4.38 and baseline of 3.6 Urine protein is markedly elevated CBC without elevated white blood cell count there is bacteria in the urine without whites likely does not represent infection.  No urobilinogen noted urine is clear Imaging Studies:  I ordered imaging studies including chest x-ray which shows consolidation and resultant CT chest abdomen pelvis given history of smoking I independently visualized and interpreted imaging which showed evidence of acute multifocal nodular pneumonia and benign renal cyst seen on previous imaging I agree with the radiologist interpretation  Cardiac Monitoring/ECG:  The patient was maintained on a cardiac monitor.  I personally viewed and interpreted the cardiac monitored which showed an underlying rhythm of: Sinus rhythm at a rate of 80  Medicines ordered and prescription drug management:  I ordered medication including  Medications  acetaminophen  (TYLENOL ) tablet 650 mg (650 mg Oral Given 04/04/24 1216)  cefTRIAXone  (ROCEPHIN ) 1 g in sodium chloride  0.9 % 100 mL IVPB (0 g Intravenous Stopped 04/04/24 1343)  azithromycin  (ZITHROMAX ) tablet 500 mg (500 mg Oral Given 04/04/24 1240)   for headache and pneumonia Reevaluation of the patient after these medicines showed that the patient improved I have reviewed the patients home medicines and have made adjustments as needed  Test Considered:    Critical Interventions:    Consultations Obtained: I discussed the case with Dr.Bhandari of nephrology regarding his creatinine bump and protein in his urine.  He is recommending fluid resuscitation and as the patient is currently tolerating fluids at home thinks that he could do this outpatient with close follow-up.  He asked that the patient call Dr. Tobie tomorrow.  Problem List / ED Course:      ICD-10-CM   1. Multifocal pneumonia  J18.9     2. Acute kidney injury superimposed on chronic kidney disease (HCC)  N17.9    N18.9     3. Proteinuria, unspecified type  R80.9       MDM: Patient here with cough and wheezing.  It appears that he has evidence of pneumonia.  Treated in the emergency department with significant improvement.  Case discussed with outpatient nephrology safe for outpatient follow-up with good oral rehydration.  Perhaps dark urine represented some dehydration which certainly can be increased in the setting of respiratory loss. Patient comfortable with plan.  Discharged with antibiotics and return precautions.   Dispostion:  After consideration of the diagnostic results and the patients response to treatment, I feel that the patent would benefit from outpatient treatment with strict return precautions.      Final diagnoses:  None    ED Discharge Orders     None          Arloa Chroman, PA-C 04/07/24 1509    Neysa Caron PARAS, DO 04/12/24 2344

## 2024-04-08 ENCOUNTER — Other Ambulatory Visit: Payer: Self-pay | Admitting: Cardiology

## 2024-04-09 ENCOUNTER — Ambulatory Visit (INDEPENDENT_AMBULATORY_CARE_PROVIDER_SITE_OTHER): Admitting: Podiatrist

## 2024-04-09 ENCOUNTER — Ambulatory Visit: Admitting: Podiatry

## 2024-04-09 ENCOUNTER — Encounter: Payer: Self-pay | Admitting: Podiatrist

## 2024-04-09 DIAGNOSIS — M79675 Pain in left toe(s): Secondary | ICD-10-CM

## 2024-04-09 DIAGNOSIS — E1122 Type 2 diabetes mellitus with diabetic chronic kidney disease: Secondary | ICD-10-CM

## 2024-04-09 DIAGNOSIS — M79674 Pain in right toe(s): Secondary | ICD-10-CM | POA: Diagnosis not present

## 2024-04-09 DIAGNOSIS — Z794 Long term (current) use of insulin: Secondary | ICD-10-CM

## 2024-04-09 DIAGNOSIS — B351 Tinea unguium: Secondary | ICD-10-CM

## 2024-04-09 DIAGNOSIS — N1832 Chronic kidney disease, stage 3b: Secondary | ICD-10-CM

## 2024-04-12 ENCOUNTER — Telehealth: Payer: Self-pay | Admitting: Cardiology

## 2024-04-12 MED ORDER — FUROSEMIDE 20 MG PO TABS: 20.0000 mg | ORAL_TABLET | Freq: Every day | ORAL | 2 refills | Status: DC

## 2024-04-12 NOTE — Progress Notes (Signed)
 This patient returns to my office for at risk foot care.  This patient requires this care by a professional since this patient will be at risk due to having diabetes.  This patient is unable to cut nails himself since the patient cannot reach his nails.These nails are painful walking and wearing shoes.  This patient presents for at risk foot care today.  General Appearance  Alert, conversant and in no acute stress.  Vascular  Dorsalis pedis and posterior tibial  pulses are palpable  bilaterally.  Capillary return is within normal limits  bilaterally. Temperature is within normal limits  bilaterally.  Neurologic  Senn-Weinstein monofilament wire test within normal limits  bilaterally. Muscle power within normal limits bilaterally.  Nails Thick disfigured discolored nails with subungual debris  from hallux to fifth toes bilaterally. No evidence of bacterial infection or drainage bilaterally.  Orthopedic  No limitations of motion  feet .  No crepitus or effusions noted.  No bony pathology or digital deformities noted.  Skin  normotropic skin with no porokeratosis noted bilaterally.  No signs of infections or ulcers noted.     Onychomycosis  Pain in right toes  Pain in left toes  Consent was obtained for treatment procedures.   Mechanical debridement of nails 1-5  bilaterally performed with a nail nipper.  Filed with dremel without incident. Wife helped with nail care.   Return office visit   10 weeks                   Told patient to return for periodic foot care and evaluation due to potential at risk complications.

## 2024-04-12 NOTE — Telephone Encounter (Signed)
*  STAT* If patient is at the pharmacy, call can be transferred to refill team.   1. Which medications need to be refilled? (please list name of each medication and dose if known)   furosemide  (LASIX ) 20 MG tablet   2. Which pharmacy/location (including street and city if local pharmacy) is medication to be sent to? [id number] - [place], [place] - [address] Phone: [phone]  Fax: [phone]     3. Do they need a 30 day or 90 day supply? 90

## 2024-04-12 NOTE — Telephone Encounter (Signed)
 RX sent to requested Pharmacy

## 2024-04-13 DIAGNOSIS — H43391 Other vitreous opacities, right eye: Secondary | ICD-10-CM | POA: Diagnosis not present

## 2024-04-13 DIAGNOSIS — N183 Chronic kidney disease, stage 3 unspecified: Secondary | ICD-10-CM | POA: Diagnosis not present

## 2024-04-13 DIAGNOSIS — E1122 Type 2 diabetes mellitus with diabetic chronic kidney disease: Secondary | ICD-10-CM | POA: Diagnosis not present

## 2024-04-22 DIAGNOSIS — E113293 Type 2 diabetes mellitus with mild nonproliferative diabetic retinopathy without macular edema, bilateral: Secondary | ICD-10-CM | POA: Diagnosis not present

## 2024-04-23 ENCOUNTER — Other Ambulatory Visit: Payer: Self-pay | Admitting: Cardiology

## 2024-04-23 DIAGNOSIS — C678 Malignant neoplasm of overlapping sites of bladder: Secondary | ICD-10-CM | POA: Diagnosis not present

## 2024-04-23 DIAGNOSIS — R310 Gross hematuria: Secondary | ICD-10-CM | POA: Diagnosis not present

## 2024-04-29 DIAGNOSIS — N184 Chronic kidney disease, stage 4 (severe): Secondary | ICD-10-CM | POA: Diagnosis not present

## 2024-04-29 DIAGNOSIS — N2581 Secondary hyperparathyroidism of renal origin: Secondary | ICD-10-CM | POA: Diagnosis not present

## 2024-04-29 DIAGNOSIS — B029 Zoster without complications: Secondary | ICD-10-CM | POA: Diagnosis not present

## 2024-04-29 DIAGNOSIS — D631 Anemia in chronic kidney disease: Secondary | ICD-10-CM | POA: Diagnosis not present

## 2024-04-29 DIAGNOSIS — I129 Hypertensive chronic kidney disease with stage 1 through stage 4 chronic kidney disease, or unspecified chronic kidney disease: Secondary | ICD-10-CM | POA: Diagnosis not present

## 2024-04-30 LAB — LAB REPORT - SCANNED: EGFR: 16

## 2024-05-10 ENCOUNTER — Other Ambulatory Visit: Payer: Self-pay

## 2024-05-10 DIAGNOSIS — E118 Type 2 diabetes mellitus with unspecified complications: Secondary | ICD-10-CM

## 2024-05-10 DIAGNOSIS — Z794 Long term (current) use of insulin: Secondary | ICD-10-CM

## 2024-05-10 MED ORDER — INSULIN ASPART 100 UNIT/ML IJ SOLN: INTRAMUSCULAR | 2 refills | Status: DC

## 2024-05-13 ENCOUNTER — Other Ambulatory Visit: Payer: Self-pay

## 2024-05-13 ENCOUNTER — Ambulatory Visit (INDEPENDENT_AMBULATORY_CARE_PROVIDER_SITE_OTHER): Admitting: Endocrinology

## 2024-05-13 ENCOUNTER — Ambulatory Visit: Payer: Self-pay | Admitting: Endocrinology

## 2024-05-13 ENCOUNTER — Encounter: Payer: Self-pay | Admitting: Endocrinology

## 2024-05-13 VITALS — BP 138/60 | HR 77 | Resp 16 | Ht 66.0 in | Wt 202.8 lb

## 2024-05-13 DIAGNOSIS — Z794 Long term (current) use of insulin: Secondary | ICD-10-CM

## 2024-05-13 DIAGNOSIS — E118 Type 2 diabetes mellitus with unspecified complications: Secondary | ICD-10-CM

## 2024-05-13 LAB — POCT GLYCOSYLATED HEMOGLOBIN (HGB A1C): Hemoglobin A1C: 6.1 % — AB (ref 4.0–5.6)

## 2024-05-13 MED ORDER — INSULIN ASPART 100 UNIT/ML IJ SOLN: INTRAMUSCULAR | 2 refills | Status: DC

## 2024-05-13 NOTE — Progress Notes (Signed)
 Outpatient Endocrinology Note [place] Disney Ruggiero, MD  05/13/24  Patient's Name: Alec [name]    DOB: 1946/04/17    MRN: [id number]                                                    REASON OF VISIT: Follow up for type 2 diabetes mellitus  PCP: Loreli Kins, MD  HISTORY OF PRESENT ILLNESS:   Alec [name] is a 78 y.o. old male with past medical history listed below, is here for follow up of type 2 diabetes mellitus.   Pertinent Diabetes History: _Diagnosed as diabetes mellitus  type 2 in 2001.  Chronic Diabetes Complications : Retinopathy: no. Last ophthalmology exam was done on annually.  Nephropathy: yes, on olmesartan , following with nephrology.  Peripheral neuropathy: yes, numbness of feet.  Coronary artery disease: no Stroke: no  Relevant comorbidities and cardiovascular risk factors: Obesity: yes Body mass index is 32.73 kg/m.  Hypertension: yes Hyperlipidemia. Yes, on statin  Current / Home Diabetic regimen includes:  T:slim tandem with Dexcom G6 on control IQ, using NovoLog  U100  Insulin  Pump setting:  Basal ( total 54.750 units) MN- 2.0u/hour 2AM- 1.8  7AM- 2.250 4PM- 2.7 7PM-   3.0 10PM- 2.20  Bolus CHO Ratio (1unit:CHO) MN- 1:8  No exact carb counting, he has been using 20-80 carb count based on meal size.  Correction/Sensitivity: MN- 1:30 10PM- 1:35  Target: 110  Active insulin  time: 5 hours  Prior diabetic medications:  V-go pump. Amaryl, basal bolus insulins.   CONTINUOUS GLUCOSE MONITORING SYSTEM (CGMS) / INSULIN  PUMP INTERPRETATION:                         Tandem Pump & Sensor Download (Reviewed and summarized below.) Pump: Dexcom G7 and Tandem t:slim Dates: August 1 to May 13, 2024 for 14 days   Glucose Management Indicator:%  CGM/Sensor usage:% Time in range :%   Average daily carbs entered: 225 Average total daily insulin :  83 units, Basal: 64%, Bolus: 36%,  (Manual Bolus: 100%,  Control IQ Bolus: %)   Control IQ Time  in Use: 0%  Pump download as reviewed above.  He has Dexcom G7 however it is not connected to work with pump.  No glucose data available to review.  He has been bolusing for meals frequently.   Hypoglycemia: Patient has ? hypoglycemic episodes. Patient has hypoglycemia awareness.    Factors modifying glucose control: 1.  Diabetic diet assessment: 3 meals a day.   2.  Staying active or exercising:   3.  Medication compliance: compliant all of the time.  Interval history  Pump data as reviewed above.  He has Dexcom G7 however not connected with insulin  pump and no glucose data available to review.  Hemoglobin A1c 6.1% today.  No other complaints today.   REVIEW OF SYSTEMS As per history of present illness.   PAST MEDICAL HISTORY: Past Medical History:  Diagnosis Date   Anemia    Anxiety    Aortic insufficiency    Arthritis    Bilateral lower extremity edema    Chronic gout    06-19-2020  per pt last episode 6 months , great toe   CKD (chronic kidney disease), stage IV [facility])    Coronary artery disease cardiologist--- dr lavona   CABG  2019   Degenerative arthritis of shoulder region 08/2013   left   History of bladder cancer 07/2008   s/p  TURBT   History of cellulitis 11/2017   left upper arm   History of kidney stones    Hyperlipidemia    Hypertension    followed by pcp/ cardiology   IDDM (insulin dependent diabetes mellitus)    endocrinologist--- dr von---  pt uses insulin pump and dexcom  (06-19-2020 per pt fasting sugar-- 98--110)   Insulin pump in place    Mitral regurgitation    OSA (obstructive sleep apnea)    Prostate cancer [facility]) urologist--- dr herrick/  oncologist--- manning   dx 11/ 2017  Gleason 3+3 active survillance;  bx 03-14-2020 Gleason 3+4   RA (rheumatoid arthritis) (HCC)    rhemotologist--- dr donnis---     S/P CABG x 3 07/15/2018   LIMA to LAD;  SVG to PDA;  SVG to RI   Thrombocytopenia (HCC)     PAST SURGICAL HISTORY: Past  Surgical History:  Procedure Laterality Date   CHOLECYSTECTOMY  08/29/2012   Procedure: LAPAROSCOPIC CHOLECYSTECTOMY WITH INTRAOPERATIVE CHOLANGIOGRAM;  Surgeon: Donnice POUR. Tsuei, MD;  Location: [facility] ORS;  Service: General;  Laterality: N/A;   CORONARY ARTERY BYPASS GRAFT N/A 07/20/2018   Procedure: CORONARY ARTERY BYPASS GRAFTING (CABG) times three using left internal mammary artery to the LAD, and using endoscopically harvested right saphenous vein to PDA and intermedius.;  Surgeon: Army Dallas NOVAK, MD;  Location: [facility] OR;  Service: Open Heart Surgery;  Laterality: N/A;   LEFT HEART CATH AND CORONARY ANGIOGRAPHY N/A 07/15/2018   Procedure: LEFT HEART CATH AND CORONARY ANGIOGRAPHY;  Surgeon: Anner Alm ORN, MD;  Location: [facility] INVASIVE CV LAB;  Service: Cardiovascular;  Laterality: N/A;   RADIOACTIVE SEED IMPLANT N/A 06/21/2020   Procedure: RADIOACTIVE SEED IMPLANT/BRACHYTHERAPY IMPLANT;  Surgeon: Cam Morene ORN, MD;  Location: [facility];  Service: Urology;  Laterality: N/A;   SHOULDER ARTHROSCOPY W/ ROTATOR CUFF REPAIR Right 09/03/2013   SHOULDER ARTHROSCOPY WITH SUBACROMIAL DECOMPRESSION, ROTATOR CUFF REPAIR AND BICEP TENDON REPAIR Left 09/03/2013   Procedure: LEFT SHOULDER ARTHROSCOPY WITH EXTENSIVE DEBRIDMENT, DISTAL CLAVICULECTOMY, ROTATOR CUFF REPAIR AND SUBACROMIAL DECOMPRESSION PARTIAL ACRIOMIOPLASTY WITH CORACOACROMIAL RELEASE;  Surgeon: Evalene JONETTA Chancy, MD;  Location: [facility];  Service: Orthopedics;  Laterality: Left;   SPACE OAR INSTILLATION N/A 06/21/2020   Procedure: SPACE OAR INSTILLATION;  Surgeon: Cam Morene ORN, MD;  Location: [facility];  Service: Urology;  Laterality: N/A;   TEE WITHOUT CARDIOVERSION N/A 07/20/2018   Procedure: TRANSESOPHAGEAL ECHOCARDIOGRAM (TEE);  Surgeon: Army Dallas NOVAK, MD;  Location: [facility] OR;  Service: Open Heart Surgery;  Laterality: N/A;   TRANSURETHRAL RESECTION OF BLADDER TUMOR WITH MITOMYCIN-C   08/24/2008   @[facility]     ALLERGIES: Allergies  Allergen Reactions   Nsaids Other (See Comments)    Told to avoid by dr     FAMILY HISTORY:  Family History  Problem Relation Age of Onset   Diabetes Mother    Lung cancer Mother    Cancer Father    Coronary artery disease Father        MI at age 37.    Lung cancer Father    Sudden death Son    CAD Maternal Grandfather        MI in his 60's   CAD Paternal Grandfather    Breast cancer Neg Hx    Colon cancer Neg Hx    Pancreatic cancer  Neg Hx     SOCIAL HISTORY: Social History   Socioeconomic History   Marital status: Married    Spouse name: Not on file   Number of children: Not on file   Years of education: Not on file   Highest education level: Not on file  Occupational History   Not on file  Tobacco Use   Smoking status: Former    Current packs/day: 0.00    Average packs/day: 2.0 packs/day for 25.0 years (50.0 ttl pk-yrs)    Types: Cigarettes    Start date: 09/11/1972    Quit date: 09/11/1997    Years since quitting: 26.6   Smokeless tobacco: Never  Vaping Use   Vaping status: Never Used  Substance and Sexual Activity   Alcohol use: Not Currently    Comment: >20 years ago, no heavy use   Drug use: Never   Sexual activity: Yes    Partners: Female    Comment: MARRIED  Other Topics Concern   Not on file  Social History Narrative   Not on file   Social Drivers of Health   Financial Resource Strain: Not on file  Food Insecurity: Unknown (03/14/2023)   Hunger Vital Sign    Worried About Running Out of Food in the Last Year: Never true    Ran Out of Food in the Last Year: Patient declined  Transportation Needs: No Transportation Needs (03/14/2023)   PRAPARE - Administrator, Civil Service (Medical): No    Lack of Transportation (Non-Medical): No  Physical Activity: Not on file  Stress: Not on file  Social Connections: Not on file    MEDICATIONS:  Current Outpatient Medications  Medication  Sig Dispense Refill   allopurinol (ZYLOPRIM) 100 MG tablet Take 0.5 tablets (50 mg total) by mouth daily. 45 tablet 3   amoxicillin (AMOXIL) 500 MG capsule Take 2 capsules (1,000 mg total) by mouth 2 (two) times daily. 40 capsule 0   aspirin EC 81 MG tablet Take 81 mg by mouth daily.     atorvastatin (LIPITOR) 40 MG tablet TAKE 1 TABLET BY MOUTH EVERY DAY 90 tablet 3   Boswellia-Glucosamine-Vit D (OSTEO BI-FLEX ONE PER DAY PO) Take 1 tablet by mouth daily.     carvedilol (COREG) 12.5 MG tablet Take 1 tablet (12.5 mg total) by mouth 2 (two) times daily with a meal. 180 tablet 1   Cholecalciferol (VITAMIN D3 PO) Take 1 capsule by mouth daily.     CINNAMON PO Take 500 mg by mouth daily.     furosemide (LASIX) 20 MG tablet Take 1 tablet (20 mg total) by mouth daily. 90 tablet 2   gabapentin (NEURONTIN) 100 MG capsule Take 1 capsule (100 mg total) by mouth 3 (three) times daily as needed (pain). 20 capsule 0   hydrALAZINE (APRESOLINE) 100 MG tablet Take 1 tablet (100 mg total) by mouth 3 (three) times daily. 90 tablet 2   Insulin Human (INSULIN PUMP) SOLN Inject into the skin.     isosorbide mononitrate (IMDUR) 120 MG 24 hr tablet TAKE 1 TABLET BY MOUTH EVERY DAY 90 tablet 3   latanoprost (XALATAN) 0.005 % ophthalmic solution Place 1 drop into both eyes at bedtime.     metoprolol tartrate (LOPRESSOR) 50 MG tablet TAKE 1 TABLET BY MOUTH TWICE A DAY 180 tablet 3   Multiple Vitamins-Minerals (CENTRUM SILVER PO) Take 1 tablet by mouth daily.     nitroGLYCERIN (NITROSTAT) 0.4 MG SL tablet Place 1 tablet (0.4 mg  total) under the tongue every 5 (five) minutes as needed for chest pain. 30 tablet 1   olmesartan (BENICAR) 5 MG tablet Take 1 tablet (5 mg total) by mouth daily.     omeprazole (PRILOSEC) 20 MG capsule Take 1 capsule (20 mg total) by mouth daily. 30 capsule 1   ondansetron (ZOFRAN-ODT) 4 MG disintegrating tablet Take 1 tablet (4 mg total) by mouth every 8 (eight) hours as needed for nausea or  vomiting. 20 tablet 0   sertraline (ZOLOFT) 100 MG tablet TAKE 1 TABLET BY MOUTH EVERY DAY 90 tablet 3   tamsulosin (FLOMAX) 0.4 MG CAPS capsule Take 0.4 mg by mouth at bedtime.     insulin aspart (NOVOLOG) 100 UNIT/ML injection USE A MAX OF 100 UNITS DAILY VIA INSULIN PUMP 80 mL 2   No current facility-administered medications for this visit.    PHYSICAL EXAM: Vitals:   05/13/24 0841  BP: 138/60  Pulse: 77  Resp: 16  SpO2: 97%  Weight: 202 lb 12.8 oz (92 kg)  Height: 5' 6 (1.676 m)    Body mass index is 32.73 kg/m.  Wt Readings from Last 3 Encounters:  05/13/24 202 lb 12.8 oz (92 kg)  04/04/24 192 lb (87.1 kg)  01/22/24 194 lb 9.6 oz (88.3 kg)    General: Well developed, well nourished male in no apparent distress.  HEENT: AT/[place], no external lesions.  Eyes: Conjunctiva clear and no icterus. Neck: Neck supple  Lungs: Respirations not labored Neurologic: Alert, oriented, normal speech Extremities / Skin: Dry.   Psychiatric: Does not appear depressed or anxious  Diabetic Foot Exam - Simple   No data filed    LABS Reviewed Lab Results  Component Value Date   HGBA1C 6.1 (A) 05/13/2024   HGBA1C 6.1 (H) 01/15/2024   HGBA1C 6.0 (H) 10/16/2023   Lab Results  Component Value Date   FRUCTOSAMINE 260 06/12/2022   Lab Results  Component Value Date   CHOL 102 03/29/2022   HDL 24 (L) 03/29/2022   LDLCALC 57 03/29/2022   TRIG 107 03/29/2022   CHOLHDL 4.3 03/29/2022   No results found for: [facility]  Lab Results  Component Value Date   CREATININE 4.38 (H) 04/04/2024   Lab Results  Component Value Date   GFR 23.26 (L) 07/10/2023    ASSESSMENT / PLAN  1. Controlled type 2 diabetes mellitus with complication, with long-term current use of insulin (HCC)      Diabetes Mellitus type 2, complicated by diabetic nephropathy / neuropathy. .  - Diabetic status / severity: controlled.   Lab Results  Component Value Date   HGBA1C 6.1 (A) 05/13/2024    -  Hemoglobin A1c goal <6.5%   Patient is asked to use a Dexcom G7 and connect with insulin pump to allow pump to work on control IQ mode.  - Medications:   T:slim tandem with Dexcom G7 on control IQ, using NovoLog U100  No change in the pump setting today.  - Home glucose testing: continue CGM and check blood glucose as needed.  - Discussed/ Gave Hypoglycemia treatment plan.  # Consult : not required at this time.   # Annual urine for microalbuminuria/ creatinine ratio, no microalbuminuria currently, continue ACE/ARB / olmesartan, following with nephrology.  He wants to check urine microalbumin creatinine ratio with nephrology. Last  No results found for: MICRALBCREAT   # Foot check nightly / neuropathy.  # Annual dilated diabetic eye exams.   - Diet: Make healthy diabetic food choices -  Life style / activity / exercise: discussed.   2. Blood pressure  -  BP Readings from Last 1 Encounters:  05/13/24 138/60    - Control is in target.  - No change in current plans.   3. Lipid status / Hyperlipidemia - Last  Lab Results  Component Value Date   LDLCALC 57 03/29/2022   - Continue atorvastatin 40mg  daily.  Managed by primary care provider.  Diagnoses and all orders for this visit:  Controlled type 2 diabetes mellitus with complication, with long-term current use of insulin (HCC) -     POCT glycosylated hemoglobin (Hb A1C) -     insulin aspart (NOVOLOG) 100 UNIT/ML injection; USE A MAX OF 100 UNITS DAILY VIA INSULIN PUMP    DISPOSITION Follow up in clinic in 3  months suggested.    All questions answered and patient verbalized understanding of the plan.  [place] Mailynn Everly, MD [facility] Endocrinology [facility] [address] [place], [address] [place], [place] [address] Phone # [phone]  At least part of this note was generated using voice recognition software. Inadvertent word errors may have occurred, which were not recognized during the proofreading  process.

## 2024-06-01 ENCOUNTER — Other Ambulatory Visit: Payer: Self-pay | Admitting: Family Medicine

## 2024-06-01 DIAGNOSIS — J188 Other pneumonia, unspecified organism: Secondary | ICD-10-CM

## 2024-06-11 ENCOUNTER — Ambulatory Visit
Admission: RE | Admit: 2024-06-11 | Discharge: 2024-06-11 | Disposition: A | Discharge status: Home / Self Care | Source: Ambulatory Visit | Attending: Family Medicine | Admitting: Family Medicine

## 2024-06-11 DIAGNOSIS — J188 Other pneumonia, unspecified organism: Secondary | ICD-10-CM

## 2024-06-11 DIAGNOSIS — E113293 Type 2 diabetes mellitus with mild nonproliferative diabetic retinopathy without macular edema, bilateral: Secondary | ICD-10-CM | POA: Diagnosis not present

## 2024-06-11 DIAGNOSIS — R059 Cough, unspecified: Secondary | ICD-10-CM | POA: Diagnosis not present

## 2024-06-11 DIAGNOSIS — R918 Other nonspecific abnormal finding of lung field: Secondary | ICD-10-CM | POA: Diagnosis not present

## 2024-06-24 DIAGNOSIS — N184 Chronic kidney disease, stage 4 (severe): Secondary | ICD-10-CM | POA: Diagnosis not present

## 2024-06-24 DIAGNOSIS — E1122 Type 2 diabetes mellitus with diabetic chronic kidney disease: Secondary | ICD-10-CM | POA: Diagnosis not present

## 2024-06-24 DIAGNOSIS — J189 Pneumonia, unspecified organism: Secondary | ICD-10-CM | POA: Diagnosis not present

## 2024-06-24 DIAGNOSIS — M069 Rheumatoid arthritis, unspecified: Secondary | ICD-10-CM | POA: Diagnosis not present

## 2024-06-25 ENCOUNTER — Ambulatory Visit (INDEPENDENT_AMBULATORY_CARE_PROVIDER_SITE_OTHER): Admitting: Podiatry

## 2024-06-25 ENCOUNTER — Encounter: Payer: Self-pay | Admitting: Podiatry

## 2024-06-25 DIAGNOSIS — N1832 Chronic kidney disease, stage 3b: Secondary | ICD-10-CM

## 2024-06-25 DIAGNOSIS — E1122 Type 2 diabetes mellitus with diabetic chronic kidney disease: Secondary | ICD-10-CM

## 2024-06-25 DIAGNOSIS — B351 Tinea unguium: Secondary | ICD-10-CM | POA: Diagnosis not present

## 2024-06-25 DIAGNOSIS — Z794 Long term (current) use of insulin: Secondary | ICD-10-CM

## 2024-06-25 DIAGNOSIS — M79674 Pain in right toe(s): Secondary | ICD-10-CM | POA: Diagnosis not present

## 2024-06-25 DIAGNOSIS — M79675 Pain in left toe(s): Secondary | ICD-10-CM

## 2024-06-25 NOTE — Progress Notes (Signed)
 This patient returns to my office for at risk foot care.  This patient requires this care by a professional since this patient will be at risk due to having diabetes.  This patient is unable to cut nails himself since the patient cannot reach his nails.These nails are painful walking and wearing shoes.  This patient presents for at risk foot care today.  General Appearance  Alert, conversant and in no acute stress.  Vascular  Dorsalis pedis and posterior tibial  pulses are palpable  bilaterally.  Capillary return is within normal limits  bilaterally. Temperature is within normal limits  bilaterally.  Neurologic  Senn-Weinstein monofilament wire test within normal limits  bilaterally. Muscle power within normal limits bilaterally.  Nails Thick disfigured discolored nails with subungual debris  from hallux to fifth toes bilaterally. No evidence of bacterial infection or drainage bilaterally.  Orthopedic  No limitations of motion  feet .  No crepitus or effusions noted.  No bony pathology or digital deformities noted.  Skin  normotropic skin with no porokeratosis noted bilaterally.  No signs of infections or ulcers noted.     Onychomycosis  Pain in right toes  Pain in left toes  Consent was obtained for treatment procedures.   Mechanical debridement of nails 1-5  bilaterally performed with a nail nipper.  Filed with dremel without incident. Wife helped with nail care.   Return office visit   10 weeks                 Told patient to return for periodic foot care and evaluation due to potential at risk complications.   Helane Gunther DPM

## 2024-06-28 ENCOUNTER — Encounter (HOSPITAL_BASED_OUTPATIENT_CLINIC_OR_DEPARTMENT_OTHER): Payer: Self-pay

## 2024-06-28 ENCOUNTER — Ambulatory Visit (INDEPENDENT_AMBULATORY_CARE_PROVIDER_SITE_OTHER)

## 2024-06-28 VITALS — BP 155/66 | HR 63 | Ht 66.0 in | Wt 201.0 lb

## 2024-06-28 DIAGNOSIS — J189 Pneumonia, unspecified organism: Secondary | ICD-10-CM

## 2024-06-28 DIAGNOSIS — Z87891 Personal history of nicotine dependence: Secondary | ICD-10-CM

## 2024-06-28 DIAGNOSIS — R0609 Other forms of dyspnea: Secondary | ICD-10-CM

## 2024-06-28 DIAGNOSIS — R0602 Shortness of breath: Secondary | ICD-10-CM

## 2024-06-28 MED ORDER — ALBUTEROL SULFATE HFA 108 (90 BASE) MCG/ACT IN AERS: 2.0000 | INHALATION_SPRAY | Freq: Four times a day (QID) | RESPIRATORY_TRACT | 6 refills | Status: AC | PRN

## 2024-06-28 NOTE — Progress Notes (Signed)
 @Patient  ID: Alec [name], male    DOB: 1946-08-12, 78 y.o.   MRN: [id number]  Chief Complaint  Patient presents with   Pneumonia    Referring provider: Loreli Kins, MD  HPI: Alec [name] is a 78 y/o male former smoker with PMH of HTN, CAD, chronic CHF, RA, DM, COPD, OSA, and laryngopharyngeal reflux who presents for follow up.  He was last seen in office in August of 2024 for DOE.  PFTs were ordered at that time but not completed.  Today he reports that he is here for follow-up at the request of his PCP.  He reports that he became short of breath with wheezing and overall not feeling well in July of this year.  He then went to the emergency department and was diagnosed with multifocal pneumonia.  There were multiple areas in the right lung that were affected at that time.  He reports that he took amoxicillin  and potentially Zithromax  at that time.  Repeat CT was ordered and completed on 06/11/2024.  That showed resolution of the right sided pneumonia but showed a new left lingular pneumonia.  Patient was started on oral Levaquin at a decreased dose due to his stage IV kidney disease.  He completed that about 1 week ago.  He reports that he did feel better after taking this medication.  He does have an intermittent cough, sometimes productive of small amounts of white sputum.  Prior to these events he indicated that he has had some shortness of breath with exertion, wheezing, and cough.  He denies any ongoing fever chills or appetite changes.  He does report issues with reflux at times and sometimes reports that he has to vomit after eating.  He denies chest pain, fevers, night sweats, dizziness, headaches, leg swelling, or other complaints.  TEST/EVENTS : PFT 06/2018:  normal spirometry  04/04/2024:  Chest CT:  1. Multifocal nodular airspace disease in the RIGHT upper lobe and RIGHT lower lobe. Findings most consistent with multifocal pneumonia. Recommend follow-up CT to demonstrate  resolution after appropriate therapy (6 to 8 weeks).  Left lung clear. 2. No thoracic adenopathy. 3. Post CABG anatomy.  06/11/2024 Chest CT:  IMPRESSION: 1. Fluctuant areas of heterogeneous consolidative and nodular opacity in the lungs, which are almost completely resolved in the posterior right upper lobe with irregular residua in the dependent right lower lobe but new and extensive in the posterior lingula. Findings are consistent with ongoing multifocal infection or aspiration. 2. No evidence of fibrotic interstitial lung disease. 3. Coronary artery disease. 4. Coarse contour of the liver and splenomegaly, suggestive of cirrhosis. Correlate with biochemical findings.  Allergies  Allergen Reactions   Nsaids Other (See Comments)    Told to avoid by dr     Immunization History  Administered Date(s) Administered   INFLUENZA, HIGH DOSE SEASONAL PF 07/08/2016, 06/17/2018, 07/27/2019, 06/13/2020   Influenza,inj,Quad PF,6+ Mos 06/28/2015   Influenza-Unspecified 07/17/2017   PFIZER(Purple Top)SARS-COV-2 Vaccination 11/20/2019, 12/13/2019   Pneumococcal Polysaccharide-23 08/30/2012    Past Medical History:  Diagnosis Date   Anemia    Anxiety    Aortic insufficiency    Arthritis    Bilateral lower extremity edema    Chronic gout    06-19-2020  per pt last episode 6 months , great toe   CKD (chronic kidney disease), stage IV [facility])    Coronary artery disease cardiologist--- dr hochrein   CABG 2019   Degenerative arthritis of shoulder region 08/2013   left  History of bladder cancer 07/2008   s/p  TURBT   History of cellulitis 11/2017   left upper arm   History of kidney stones    Hyperlipidemia    Hypertension    followed by pcp/ cardiology   IDDM (insulin  dependent diabetes mellitus)    endocrinologist--- dr von---  pt uses insulin  pump and dexcom  (06-19-2020 per pt fasting sugar-- 98--110)   Insulin  pump in place    Mitral regurgitation    OSA (obstructive sleep  apnea)    Prostate cancer [facility]) urologist--- dr herrick/  oncologist--- manning   dx 11/ 2017  Gleason 3+3 active survillance;  bx 03-14-2020 Gleason 3+4   RA (rheumatoid arthritis) (HCC)    rhemotologist--- dr donnis---     S/P CABG x 3 07/15/2018   LIMA to LAD;  SVG to PDA;  SVG to RI   Thrombocytopenia     Tobacco History: Social History   Tobacco Use  Smoking Status Former   Current packs/day: 0.00   Average packs/day: 2.0 packs/day for 25.0 years (50.0 ttl pk-yrs)   Types: Cigarettes   Start date: 09/11/1972   Quit date: 09/11/1997   Years since quitting: 26.8  Smokeless Tobacco Never   Counseling given: Not Answered   Outpatient Medications Prior to Visit  Medication Sig Dispense Refill   allopurinol  (ZYLOPRIM ) 100 MG tablet Take 0.5 tablets (50 mg total) by mouth daily. 45 tablet 3   amoxicillin  (AMOXIL ) 500 MG capsule Take 2 capsules (1,000 mg total) by mouth 2 (two) times daily. 40 capsule 0   aspirin  EC 81 MG tablet Take 81 mg by mouth daily.     atorvastatin  (LIPITOR) 40 MG tablet TAKE 1 TABLET BY MOUTH EVERY DAY 90 tablet 3   Boswellia-Glucosamine-Vit D (OSTEO BI-FLEX ONE PER DAY PO) Take 1 tablet by mouth daily.     Cholecalciferol  (VITAMIN D3 PO) Take 1 capsule by mouth daily.     CINNAMON PO Take 500 mg by mouth daily.     furosemide  (LASIX ) 20 MG tablet Take 1 tablet (20 mg total) by mouth daily. 90 tablet 2   gabapentin  (NEURONTIN ) 100 MG capsule Take 1 capsule (100 mg total) by mouth 3 (three) times daily as needed (pain). 20 capsule 0   hydrALAZINE  (APRESOLINE ) 100 MG tablet Take 1 tablet (100 mg total) by mouth 3 (three) times daily. 90 tablet 2   insulin  aspart (NOVOLOG ) 100 UNIT/ML injection USE A MAX OF 100 UNITS DAILY VIA INSULIN  PUMP 80 mL 2   Insulin  Human (INSULIN  PUMP) SOLN Inject into the skin.     isosorbide  mononitrate (IMDUR ) 120 MG 24 hr tablet TAKE 1 TABLET BY MOUTH EVERY DAY 90 tablet 3   latanoprost  (XALATAN ) 0.005 % ophthalmic  solution Place 1 drop into both eyes at bedtime.     metoprolol  tartrate (LOPRESSOR ) 50 MG tablet TAKE 1 TABLET BY MOUTH TWICE A DAY 180 tablet 3   Multiple Vitamins-Minerals (CENTRUM SILVER PO) Take 1 tablet by mouth daily.     nitroGLYCERIN  (NITROSTAT ) 0.4 MG SL tablet Place 1 tablet (0.4 mg total) under the tongue every 5 (five) minutes as needed for chest pain. 30 tablet 1   olmesartan  (BENICAR ) 5 MG tablet Take 1 tablet (5 mg total) by mouth daily.     omeprazole  (PRILOSEC) 20 MG capsule Take 1 capsule (20 mg total) by mouth daily. 30 capsule 1   ondansetron  (ZOFRAN -ODT) 4 MG disintegrating tablet Take 1 tablet (4 mg total) by mouth every  8 (eight) hours as needed for nausea or vomiting. 20 tablet 0   sertraline  (ZOLOFT ) 100 MG tablet TAKE 1 TABLET BY MOUTH EVERY DAY 90 tablet 3   tamsulosin  (FLOMAX ) 0.4 MG CAPS capsule Take 0.4 mg by mouth at bedtime.     carvedilol  (COREG ) 12.5 MG tablet Take 1 tablet (12.5 mg total) by mouth 2 (two) times daily with a meal. (Patient not taking: Reported on 06/28/2024) 180 tablet 1   No facility-administered medications prior to visit.     Review of Systems: Positive as per HPI  Constitutional:   No  weight loss, night sweats,  Fevers, chills, fatigue, or  lassitude.  HEENT:   No headaches,  Difficulty swallowing,  Tooth/dental problems, or  Sore throat,                No sneezing, itching, ear ache, nasal congestion, post nasal drip,   CV:  No chest pain,  Orthopnea, PND, swelling in lower extremities, anasarca, dizziness, palpitations, syncope.   GI  No heartburn, indigestion, abdominal pain, nausea, vomiting, diarrhea, change in bowel habits, loss of appetite, bloody stools.   Resp: No shortness of breath with exertion or at rest.  No excess mucus, no productive cough,  No non-productive cough,  No coughing up of blood.  No change in color of mucus.  No wheezing.  No chest wall deformity  Skin: no rash or lesions.  GU: no dysuria, change in  color of urine, no urgency or frequency.  No flank pain, no hematuria   MS:  No joint pain or swelling.  No decreased range of motion.  No back pain.    Physical Exam  BP (!) 155/66   Pulse 63   Ht 5' 6 (1.676 m)   Wt 201 lb (91.2 kg)   SpO2 97%   BMI 32.44 kg/m   GEN: A/Ox3; pleasant , NAD, well nourished    HEENT:  [place]/AT,  EACs-clear, TMs-wnl, NOSE-clear, THROAT-clear, no lesions, no postnasal drip or exudate noted.   NECK:  Supple w/ fair ROM; no JVD; normal carotid impulses w/o bruits; no thyromegaly or nodules palpated; no lymphadenopathy.    RESP  Clear  P & A; w/o, wheezes/ rales/ or rhonchi. no accessory muscle use, no dullness to percussion  CARD:  RRR, no m/r/g, no peripheral edema, pulses intact, no cyanosis or clubbing.  GI:   Soft & nt; nml bowel sounds; no organomegaly or masses detected.   Musco: Warm bil, no deformities or joint swelling noted.   Neuro: alert, no focal deficits noted.    Skin: Warm, no lesions or rashes    Lab Results:  CBC    Component Value Date/Time   WBC 6.9 04/04/2024 0910   RBC 3.98 (L) 04/04/2024 0910   HGB 12.6 (L) 04/04/2024 0910   HGB 12.1 (L) 10/26/2021 0902   HCT 37.0 (L) 04/04/2024 0910   HCT 33.7 (L) 10/26/2021 0902   PLT 110 (L) 04/04/2024 0910   PLT 128 (L) 10/26/2021 0902   MCV 93.0 04/04/2024 0910   MCV 92 10/26/2021 0902   MCH 31.7 04/04/2024 0910   MCHC 34.1 04/04/2024 0910   RDW 13.4 04/04/2024 0910   RDW 13.6 10/26/2021 0902   LYMPHSABS 1.1 04/04/2024 0910   LYMPHSABS 1.5 10/26/2021 0902   MONOABS 0.7 04/04/2024 0910   EOSABS 0.1 04/04/2024 0910   EOSABS 0.1 10/26/2021 0902   BASOSABS 0.0 04/04/2024 0910   BASOSABS 0.1 10/26/2021 0902    BMET  Component Value Date/Time   NA 136 04/04/2024 0910   NA 141 04/04/2022 1232   K 4.7 04/04/2024 0910   CL 100 04/04/2024 0910   CO2 21 (L) 04/04/2024 0910   GLUCOSE 207 (H) 04/04/2024 0910   BUN 55 (H) 04/04/2024 0910   BUN 63 (H) 04/04/2022 1232    CREATININE 4.38 (H) 04/04/2024 0910   CREATININE 3.61 (H) 01/15/2024 0840   CALCIUM  9.6 04/04/2024 0910   GFRNONAA 13 (L) 04/04/2024 0910   GFRAA 40 (L) 06/19/2020 0953    BNP    Component Value Date/Time   BNP 2,123.1 (H) 03/13/2023 1817    ProBNP No results found for: PROBNP  Imaging: CT Chest High Resolution Result Date: 06/17/2024 CLINICAL DATA:  Pneumonia, ongoing cough and congestion, history of bladder cancer * Tracking Code: BO * EXAM: CT CHEST WITHOUT CONTRAST TECHNIQUE: Multidetector CT imaging of the chest was performed following the standard protocol without intravenous contrast. High resolution imaging of the lungs, as well as inspiratory and expiratory imaging, was performed. RADIATION DOSE REDUCTION: This exam was performed according to the departmental dose-optimization program which includes automated exposure control, adjustment of the mA and/or kV according to patient size and/or use of iterative reconstruction technique. COMPARISON:  04/04/2024 FINDINGS: Cardiovascular: Aortic atherosclerosis. Normal heart size. Extensive three-vessel coronary artery calcifications status post median sternotomy and CABG. No pericardial effusion. Mediastinum/Nodes: No enlarged mediastinal, hilar, or axillary lymph nodes. Thyroid  gland, trachea, and esophagus demonstrate no significant findings. Lungs/Pleura: Fluctuant areas of heterogeneous consolidative and nodular opacity in the lungs, which are almost completely resolved in the posterior right upper lobe with irregular residua in the dependent right lower lobe (series 8, image 85) but new and extensive in the posterior lingula (series 8, image 67). No evidence of fibrotic interstitial lung disease. No significant air trapping on expiratory phase imaging. No pleural effusion or pneumothorax. Upper Abdomen: No acute abnormality. Splenomegaly, maximum span 15.3 cm. Cholecystectomy. Coarse contour of the liver. Musculoskeletal: No chest wall  abnormality. No acute osseous findings. IMPRESSION: 1. Fluctuant areas of heterogeneous consolidative and nodular opacity in the lungs, which are almost completely resolved in the posterior right upper lobe with irregular residua in the dependent right lower lobe but new and extensive in the posterior lingula. Findings are consistent with ongoing multifocal infection or aspiration. 2. No evidence of fibrotic interstitial lung disease. 3. Coronary artery disease. 4. Coarse contour of the liver and splenomegaly, suggestive of cirrhosis. Correlate with biochemical findings. Aortic Atherosclerosis (ICD10-I70.0). Electronically Signed   By: Marolyn JONETTA Jaksch M.D.   On: 06/17/2024 15:14    Administration History     None          Latest Ref Rng & Units 07/16/2018    3:10 PM  PFT Results  FVC-Pre L 3.19   FVC-Predicted Pre % 84   FVC-Post L 3.24   FVC-Predicted Post % 85   Pre FEV1/FVC % % 82   Post FEV1/FCV % % 82   FEV1-Pre L 2.61   FEV1-Predicted Pre % 94   FEV1-Post L 2.65     No results found for: NITRICOXIDE   Assessment & Plan:  Alec [name] is a 78 y/o male former smoker with PMH of HTN, CAD, chronic CHF, RA, DM, COPD, OSA, and laryngopharyngeal reflux who presents for follow up of multifocal pneumonia.  Follow up imaging completed after first round indicated nearly complete resolution of right sided pneumonia, but new development of lingular pneumonia.  He has subsequently been  treated with a broader spectrum antibiotic, Levaquin, having finished one week ago. Assessment & Plan Multifocal pneumonia -  Differential includes partially treated pneumonia vs COP vs inflammatory process from RA vs aspiration vs fungal  -  Plan for follow up imaging after this second round of antibiotics to see if this has fully treated the infection. -  May end up needing bronchoscopy pending results;  has not been able to cough up a significant sample for culture -  Will need GI evaluation for reflux  as this may be a contributor; continue Prilosec; referral from PCP pending Shortness of breath -  Former smoker with previously normal spirometry -  Will need full PFTs as previously indicated; new PFT order placed -  Albuterol  ordered as needed for dyspnea   Return in about 2 months (around 08/28/2024) for CT results, PFT.  Candis Dandy, PA-C 06/28/2024

## 2024-06-28 NOTE — Patient Instructions (Signed)
 Use Albuterol  2 puffs every 4-6 hours as needed  Complete Ct scan as ordered.  Complete PFT before next follow up appt.  Return to clinic sooner if new or worsening symptoms.

## 2024-06-28 NOTE — Assessment & Plan Note (Signed)
-    Former smoker with previously normal spirometry -  Will need full PFTs as previously indicated; new PFT order placed -  Albuterol  ordered as needed for dyspnea

## 2024-07-02 DIAGNOSIS — N184 Chronic kidney disease, stage 4 (severe): Secondary | ICD-10-CM | POA: Diagnosis not present

## 2024-07-09 DIAGNOSIS — N2581 Secondary hyperparathyroidism of renal origin: Secondary | ICD-10-CM | POA: Diagnosis not present

## 2024-07-09 DIAGNOSIS — D631 Anemia in chronic kidney disease: Secondary | ICD-10-CM | POA: Diagnosis not present

## 2024-07-09 DIAGNOSIS — N184 Chronic kidney disease, stage 4 (severe): Secondary | ICD-10-CM | POA: Diagnosis not present

## 2024-07-09 DIAGNOSIS — I129 Hypertensive chronic kidney disease with stage 1 through stage 4 chronic kidney disease, or unspecified chronic kidney disease: Secondary | ICD-10-CM | POA: Diagnosis not present

## 2024-07-27 ENCOUNTER — Ambulatory Visit (HOSPITAL_BASED_OUTPATIENT_CLINIC_OR_DEPARTMENT_OTHER): Payer: Self-pay

## 2024-07-27 ENCOUNTER — Ambulatory Visit (INDEPENDENT_AMBULATORY_CARE_PROVIDER_SITE_OTHER)

## 2024-07-27 ENCOUNTER — Encounter (HOSPITAL_BASED_OUTPATIENT_CLINIC_OR_DEPARTMENT_OTHER): Payer: Self-pay

## 2024-07-27 VITALS — BP 179/71 | HR 77 | Ht 66.0 in | Wt 205.0 lb

## 2024-07-27 DIAGNOSIS — J441 Chronic obstructive pulmonary disease with (acute) exacerbation: Secondary | ICD-10-CM | POA: Diagnosis not present

## 2024-07-27 MED ORDER — BENZONATATE 200 MG PO CAPS: 200.0000 mg | ORAL_CAPSULE | Freq: Three times a day (TID) | ORAL | 1 refills | Status: DC | PRN

## 2024-07-27 MED ORDER — PREDNISONE 10 MG PO TABS: ORAL_TABLET | ORAL | 0 refills | Status: AC

## 2024-07-27 MED ORDER — AMOXICILLIN-POT CLAVULANATE 875-125 MG PO TABS: 1.0000 | ORAL_TABLET | Freq: Two times a day (BID) | ORAL | 0 refills | Status: AC

## 2024-07-27 NOTE — Progress Notes (Signed)
 @Patient  ID: Alec [name], male    DOB: 06/07/1946, 78 y.o.   MRN: [id number]  Chief Complaint  Patient presents with   Cough    Acute visit     Referring provider: Loreli Kins, MD  HPI: Discussed the use of AI scribe software for clinical note transcription with the patient, who gave verbal consent to proceed.  Alec [name] is a 78 y/o male former smoker with PMH of HTN, CAD, chronic CHF, RA, DM, COPD, OSA, and laryngopharyngeal reflux who presents for an acute visit.  History of Present Illness Alec [name] is a 78 year old male with COPD who presents with persistent respiratory symptoms. He is accompanied by his wife.  He was recently hospitalized for pneumonia and completed two rounds of antibiotics, including Levaquin. He recalls that a follow-up CT scan was performed after his hospitalization for pneumonia, which showed improvement in some of the areas of pneumonia, but also a new area in his left lung. Initially, he was improving after completing his 2nd round of antibiotics (Levaquin) but has recently experienced a recurrence of symptoms over the last 3-4 days.  He experiences shortness of breath, which he manages with an albuterol  inhaler used about twice a day, providing relief. Wheezing is present, noted to be quite loud by his wife. Despite feeling unwell, there has been no increase in inhaler use.  He describes coughing up dark phlegm for the past three to four days, with associated throat soreness and chest congestion. No chest pain, fever, chills, night sweats, or worsening body aches are reported. Occasional sneezing, and clear nasal discharge are present.  No hemoptysis or facial pressure. He reports sneezing and clear nasal discharge. His appetite remains unaffected.   TEST/EVENTS :   Allergies  Allergen Reactions   Nsaids Other (See Comments)    Told to avoid by dr     Immunization History  Administered Date(s) Administered   INFLUENZA, HIGH DOSE  SEASONAL PF 07/08/2016, 06/17/2018, 07/27/2019, 06/13/2020   Influenza,inj,Quad PF,6+ Mos 06/28/2015   Influenza-Unspecified 07/17/2017   PFIZER(Purple Top)SARS-COV-2 Vaccination 11/20/2019, 12/13/2019   Pneumococcal Polysaccharide-23 08/30/2012    Past Medical History:  Diagnosis Date   Anemia    Anxiety    Aortic insufficiency    Arthritis    Bilateral lower extremity edema    Chronic gout    06-19-2020  per pt last episode 6 months , great toe   CKD (chronic kidney disease), stage IV (HCC)    Coronary artery disease cardiologist--- dr hochrein   CABG 2019   Degenerative arthritis of shoulder region 08/2013   left   History of bladder cancer 07/2008   s/p  TURBT   History of cellulitis 11/2017   left upper arm   History of kidney stones    Hyperlipidemia    Hypertension    followed by pcp/ cardiology   IDDM (insulin  dependent diabetes mellitus)    endocrinologist--- dr von---  pt uses insulin  pump and dexcom  (06-19-2020 per pt fasting sugar-- 98--110)   Insulin  pump in place    Mitral regurgitation    OSA (obstructive sleep apnea)    Prostate cancer [facility]) urologist--- dr herrick/  oncologist--- manning   dx 11/ 2017  Gleason 3+3 active survillance;  bx 03-14-2020 Gleason 3+4   RA (rheumatoid arthritis) (HCC)    rhemotologist--- dr donnis---     S/P CABG x 3 07/15/2018   LIMA to LAD;  SVG to PDA;  SVG to RI  Thrombocytopenia     Tobacco History: Social History   Tobacco Use  Smoking Status Former   Current packs/day: 0.00   Average packs/day: 2.0 packs/day for 25.0 years (50.0 ttl pk-yrs)   Types: Cigarettes   Start date: 09/11/1972   Quit date: 09/11/1997   Years since quitting: 26.8  Smokeless Tobacco Never   Counseling given: Not Answered   Outpatient Medications Prior to Visit  Medication Sig Dispense Refill   albuterol  (VENTOLIN  HFA) 108 (90 Base) MCG/ACT inhaler Inhale 2 puffs into the lungs every 6 (six) hours as needed for wheezing or  shortness of breath. 34 g 6   allopurinol  (ZYLOPRIM ) 100 MG tablet Take 0.5 tablets (50 mg total) by mouth daily. 45 tablet 3   aspirin  EC 81 MG tablet Take 81 mg by mouth daily.     atorvastatin  (LIPITOR) 40 MG tablet TAKE 1 TABLET BY MOUTH EVERY DAY 90 tablet 3   Boswellia-Glucosamine-Vit D (OSTEO BI-FLEX ONE PER DAY PO) Take 1 tablet by mouth daily.     Cholecalciferol  (VITAMIN D3 PO) Take 1 capsule by mouth daily.     CINNAMON PO Take 500 mg by mouth daily.     furosemide  (LASIX ) 20 MG tablet Take 1 tablet (20 mg total) by mouth daily. 90 tablet 2   gabapentin  (NEURONTIN ) 100 MG capsule Take 1 capsule (100 mg total) by mouth 3 (three) times daily as needed (pain). 20 capsule 0   hydrALAZINE  (APRESOLINE ) 100 MG tablet Take 1 tablet (100 mg total) by mouth 3 (three) times daily. 90 tablet 2   insulin  aspart (NOVOLOG ) 100 UNIT/ML injection USE A MAX OF 100 UNITS DAILY VIA INSULIN  PUMP 80 mL 2   Insulin  Human (INSULIN  PUMP) SOLN Inject into the skin.     isosorbide  mononitrate (IMDUR ) 120 MG 24 hr tablet TAKE 1 TABLET BY MOUTH EVERY DAY 90 tablet 3   latanoprost  (XALATAN ) 0.005 % ophthalmic solution Place 1 drop into both eyes at bedtime.     metoprolol  tartrate (LOPRESSOR ) 50 MG tablet TAKE 1 TABLET BY MOUTH TWICE A DAY 180 tablet 3   Multiple Vitamins-Minerals (CENTRUM SILVER PO) Take 1 tablet by mouth daily.     nitroGLYCERIN  (NITROSTAT ) 0.4 MG SL tablet Place 1 tablet (0.4 mg total) under the tongue every 5 (five) minutes as needed for chest pain. 30 tablet 1   olmesartan  (BENICAR ) 5 MG tablet Take 1 tablet (5 mg total) by mouth daily.     omeprazole  (PRILOSEC) 20 MG capsule Take 1 capsule (20 mg total) by mouth daily. 30 capsule 1   ondansetron  (ZOFRAN -ODT) 4 MG disintegrating tablet Take 1 tablet (4 mg total) by mouth every 8 (eight) hours as needed for nausea or vomiting. 20 tablet 0   sertraline  (ZOLOFT ) 100 MG tablet TAKE 1 TABLET BY MOUTH EVERY DAY 90 tablet 3   tamsulosin  (FLOMAX ) 0.4  MG CAPS capsule Take 0.4 mg by mouth at bedtime.     amoxicillin  (AMOXIL ) 500 MG capsule Take 2 capsules (1,000 mg total) by mouth 2 (two) times daily. 40 capsule 0   No facility-administered medications prior to visit.     Review of Systems: positive as per HPI  Constitutional:   No  weight loss, night sweats,  Fevers, chills, fatigue, or  lassitude.  HEENT:   No headaches,  Difficulty swallowing,  Tooth/dental problems, or  Sore throat,                No sneezing, itching, ear ache, nasal  congestion, post nasal drip,   CV:  No chest pain,  Orthopnea, PND, swelling in lower extremities, anasarca, dizziness, palpitations, syncope.   GI  No heartburn, indigestion, abdominal pain, nausea, vomiting, diarrhea, change in bowel habits, loss of appetite, bloody stools.   Resp: No shortness of breath with exertion or at rest.  No excess mucus, no productive cough,  No non-productive cough,  No coughing up of blood.  No change in color of mucus.  No wheezing.  No chest wall deformity  Skin: no rash or lesions.  GU: no dysuria, change in color of urine, no urgency or frequency.  No flank pain, no hematuria   MS:  No joint pain or swelling.  No decreased range of motion.  No back pain.    Physical Exam  BP (!) 179/71   Pulse 77   Ht 5' 6 (1.676 m)   Wt 205 lb (93 kg)   SpO2 96%   BMI 33.09 kg/m   GEN: A/Ox3; pleasant , NAD, well nourished.  Voice is hoarse but he is able to speak in full sentences.   HEENT:  [place]/AT,  EACs-clear, TMs-wnl, NOSE-clear, THROAT-clear, no lesions, no postnasal drip or exudate noted.   NECK:  Supple w/ fair ROM; no JVD; normal carotid impulses w/o bruits; no thyromegaly or nodules palpated; no lymphadenopathy.    RESP  Diminished but with scattered expiratory wheezing  P & A; w/o, rales/ or rhonchi. no accessory muscle use, no dullness to percussion b/l  CARD:  RRR, no m/r/g, no peripheral edema, pulses intact, no cyanosis or clubbing.  GI:   Soft,  obese & nt; nml bowel sounds; no organomegaly or masses detected.   Musco: Warm bil, no deformities or joint swelling noted.   Neuro: alert, no focal deficits noted.    Skin: Warm, no lesions or rashes    Lab Results:  CBC    Component Value Date/Time   WBC 6.9 04/04/2024 0910   RBC 3.98 (L) 04/04/2024 0910   HGB 12.6 (L) 04/04/2024 0910   HGB 12.1 (L) 10/26/2021 0902   HCT 37.0 (L) 04/04/2024 0910   HCT 33.7 (L) 10/26/2021 0902   PLT 110 (L) 04/04/2024 0910   PLT 128 (L) 10/26/2021 0902   MCV 93.0 04/04/2024 0910   MCV 92 10/26/2021 0902   MCH 31.7 04/04/2024 0910   MCHC 34.1 04/04/2024 0910   RDW 13.4 04/04/2024 0910   RDW 13.6 10/26/2021 0902   LYMPHSABS 1.1 04/04/2024 0910   LYMPHSABS 1.5 10/26/2021 0902   MONOABS 0.7 04/04/2024 0910   EOSABS 0.1 04/04/2024 0910   EOSABS 0.1 10/26/2021 0902   BASOSABS 0.0 04/04/2024 0910   BASOSABS 0.1 10/26/2021 0902    BMET    Component Value Date/Time   NA 136 04/04/2024 0910   NA 141 04/04/2022 1232   K 4.7 04/04/2024 0910   CL 100 04/04/2024 0910   CO2 21 (L) 04/04/2024 0910   GLUCOSE 207 (H) 04/04/2024 0910   BUN 55 (H) 04/04/2024 0910   BUN 63 (H) 04/04/2022 1232   CREATININE 4.38 (H) 04/04/2024 0910   CREATININE 3.61 (H) 01/15/2024 0840   CALCIUM  9.6 04/04/2024 0910   GFRNONAA 13 (L) 04/04/2024 0910   GFRAA 40 (L) 06/19/2020 0953    BNP    Component Value Date/Time   BNP 2,123.1 (H) 03/13/2023 1817    ProBNP No results found for: PROBNP  Imaging: No results found.  Administration History     None  Latest Ref Rng & Units 07/16/2018    3:10 PM  PFT Results  FVC-Pre L 3.19   FVC-Predicted Pre % 84   FVC-Post L 3.24   FVC-Predicted Post % 85   Pre FEV1/FVC % % 82   Post FEV1/FCV % % 82   FEV1-Pre L 2.61   FEV1-Predicted Pre % 94   FEV1-Post L 2.65     No results found for: NITRICOXIDE   Assessment & Plan:   Assessment & Plan COPD with acute exacerbation  (HCC)  Assessment and Plan Assessment & Plan Acute exacerbation of chronic obstructive pulmonary disease (COPD)  with dark phlegm and chest congestion.  Recent hospitalization in September 2025 for multifocal pneumonia; s/p treatment with Amoxicillin , Zithromax , and Levaquin with resolution of symptoms.  Now with new symptoms, but there is concern for antibiotic resistant infection. Symptoms indicate COPD exacerbation with wheezing and dyspnea. Differential includes antibiotic-resistant infection vs. COP vs viral or fungal infection. No fever or lung consolidation noted on exam. - Order sputum culture to guide antibiotic treatment. - Prescribe steroids to reduce inflammation and wheezing, noting potential hyperglycemia. - Prescribe alternative antibiotic pending culture results. - Increase fluid intake to thin secretions. - Advise use of humidifier or vaporizer to alleviate congestion. - Encourage coughing and deep breathing exercises. - Advise sleeping with head elevated to reduce nighttime symptoms. - Keep appointment for follow up imaging  Shortness of breath and wheezing Shortness of breath and wheezing linked to COPD exacerbation. Wheezing worse at night. Albuterol  inhaler provides partial relief. No regular maintenance inhaler use. Pulmonary function tests previously recommended but not completed. - Advise use of albuterol  inhaler every 4-6 hours as needed for dyspnea. - Consider maintenance inhaler pending pulmonary function test results. - Advise completion of pulmonary function tests once acute symptoms resolve. -  HTN:  advised him to take his BP meds; he had not taken them yet this morning   Follow up as scheduled at the end of November for PFTs, CT.  Candis Dandy, PA-C 07/27/2024

## 2024-07-27 NOTE — Patient Instructions (Addendum)
 Complete sputum culture as ordered; we will call if there are any new results.  Complete Prednisone and Augmentin as ordered.  Keep PFT, CT scan, and follow up appointments as scheduled.  Call first if any new or worsening symptoms that would prohibit completing these tests.  Increase fluid intake and rest as able.

## 2024-07-27 NOTE — Telephone Encounter (Signed)
   FYI Only or Action Required?: Action required by provider: update on patient condition.  Patient is followed in Pulmonology for COPD, last seen on 06/28/2024 by Charley Conger, PA-C.  Called Nurse Triage reporting Cough and Wheezing.  Symptoms began several days ago.  Interventions attempted: Rescue inhaler.  Symptoms are: gradually worsening.  Triage Disposition: See HCP Within 4 Hours (Or PCP Triage)  Patient/caregiver understands and will follow disposition?: YesCopied from CRM [id number]. Topic: Clinical - Red Word Triage >> Jul 27, 2024  9:37 AM Corean SAUNDERS wrote: Red Word that prompted transfer to Nurse Triage: Wheezing, chest pain, dark phlegm, and coughing. Answer Assessment - Initial Assessment Questions 1. ONSET: When did the cough begin?      Flared up over the weekend, worsening today 2. SEVERITY: How bad is the cough today?      moderate 3. SPUTUM: Describe the color of your sputum (e.g., none, dry cough; clear, white, yellow, green)     Dark brown 4. HEMOPTYSIS: Are you coughing up any blood? If Yes, ask: How much? (e.g., flecks, streaks, tablespoons, etc.)     denies 5. DIFFICULTY BREATHING: Are you having difficulty breathing? If Yes, ask: How bad is it? (e.g., mild, moderate, severe)      Occasional difficulty breathing 6. FEVER: Do you have a fever? If Yes, ask: What is your temperature, how was it measured, and when did it start?     denies 7. CARDIAC HISTORY: Do you have any history of heart disease? (e.g., heart attack, congestive heart failure)      HTN 8. LUNG HISTORY: Do you have any history of lung disease?  (e.g., pulmonary embolus, asthma, emphysema)     COPD 9. PE RISK FACTORS: Do you have a history of blood clots? (or: recent major surgery, recent prolonged travel, bedridden)     N/a 10. OTHER SYMPTOMS: Do you have any other symptoms? (e.g., runny nose, wheezing, chest pain)       Wheezing, chest tightness  Protocols used:  Cough - Acute Productive-A-AH  Reason for Disposition  Wheezing is present  Answer Assessment - Initial Assessment Questions 1. ONSET: When did the cough begin?      Flared up over the weekend, worsening today 2. SEVERITY: How bad is the cough today?      moderate 3. SPUTUM: Describe the color of your sputum (e.g., none, dry cough; clear, white, yellow, green)     Dark brown 4. HEMOPTYSIS: Are you coughing up any blood? If Yes, ask: How much? (e.g., flecks, streaks, tablespoons, etc.)     denies 5. DIFFICULTY BREATHING: Are you having difficulty breathing? If Yes, ask: How bad is it? (e.g., mild, moderate, severe)      Occasional difficulty breathing 6. FEVER: Do you have a fever? If Yes, ask: What is your temperature, how was it measured, and when did it start?     denies 7. CARDIAC HISTORY: Do you have any history of heart disease? (e.g., heart attack, congestive heart failure)      HTN 8. LUNG HISTORY: Do you have any history of lung disease?  (e.g., pulmonary embolus, asthma, emphysema)     COPD 9. PE RISK FACTORS: Do you have a history of blood clots? (or: recent major surgery, recent prolonged travel, bedridden)     N/a 10. OTHER SYMPTOMS: Do you have any other symptoms? (e.g., runny nose, wheezing, chest pain)       Wheezing, chest tightness  Protocols used: Cough - Acute Productive-A-AH

## 2024-07-31 LAB — RESPIRATORY CULTURE OR RESPIRATORY AND SPUTUM CULTURE

## 2024-08-02 LAB — RESPIRATORY CULTURE OR RESPIRATORY AND SPUTUM CULTURE
MICRO NUMBER:: 17163817
SPECIMEN QUALITY:: ADEQUATE

## 2024-08-02 LAB — HOUSE ACCOUNT TRACKING

## 2024-08-06 DIAGNOSIS — Z23 Encounter for immunization: Secondary | ICD-10-CM | POA: Diagnosis not present

## 2024-08-10 ENCOUNTER — Inpatient Hospital Stay (HOSPITAL_COMMUNITY)
Admission: EM | Admit: 2024-08-10 | Discharge: 2024-09-04 | DRG: 304 | Disposition: A | Discharge status: Skilled Nursing Facility | Attending: Family Medicine | Admitting: Family Medicine

## 2024-08-10 ENCOUNTER — Other Ambulatory Visit: Payer: Self-pay

## 2024-08-10 ENCOUNTER — Emergency Department (HOSPITAL_COMMUNITY)

## 2024-08-10 ENCOUNTER — Encounter (HOSPITAL_COMMUNITY): Payer: Self-pay | Admitting: Neurology

## 2024-08-10 DIAGNOSIS — E872 Acidosis, unspecified: Secondary | ICD-10-CM | POA: Diagnosis not present

## 2024-08-10 DIAGNOSIS — E871 Hypo-osmolality and hyponatremia: Secondary | ICD-10-CM | POA: Diagnosis not present

## 2024-08-10 DIAGNOSIS — G8191 Hemiplegia, unspecified affecting right dominant side: Secondary | ICD-10-CM | POA: Diagnosis present

## 2024-08-10 DIAGNOSIS — R579 Shock, unspecified: Secondary | ICD-10-CM | POA: Diagnosis not present

## 2024-08-10 DIAGNOSIS — R4701 Aphasia: Secondary | ICD-10-CM | POA: Diagnosis present

## 2024-08-10 DIAGNOSIS — Z87442 Personal history of urinary calculi: Secondary | ICD-10-CM

## 2024-08-10 DIAGNOSIS — I129 Hypertensive chronic kidney disease with stage 1 through stage 4 chronic kidney disease, or unspecified chronic kidney disease: Secondary | ICD-10-CM | POA: Diagnosis not present

## 2024-08-10 DIAGNOSIS — R1310 Dysphagia, unspecified: Secondary | ICD-10-CM | POA: Diagnosis present

## 2024-08-10 DIAGNOSIS — I2510 Atherosclerotic heart disease of native coronary artery without angina pectoris: Secondary | ICD-10-CM | POA: Diagnosis present

## 2024-08-10 DIAGNOSIS — R404 Transient alteration of awareness: Secondary | ICD-10-CM | POA: Diagnosis not present

## 2024-08-10 DIAGNOSIS — I130 Hypertensive heart and chronic kidney disease with heart failure and stage 1 through stage 4 chronic kidney disease, or unspecified chronic kidney disease: Secondary | ICD-10-CM | POA: Diagnosis present

## 2024-08-10 DIAGNOSIS — I472 Ventricular tachycardia, unspecified: Secondary | ICD-10-CM | POA: Diagnosis not present

## 2024-08-10 DIAGNOSIS — G9341 Metabolic encephalopathy: Secondary | ICD-10-CM | POA: Diagnosis not present

## 2024-08-10 DIAGNOSIS — Z87891 Personal history of nicotine dependence: Secondary | ICD-10-CM

## 2024-08-10 DIAGNOSIS — G9349 Other encephalopathy: Secondary | ICD-10-CM | POA: Diagnosis not present

## 2024-08-10 DIAGNOSIS — Z801 Family history of malignant neoplasm of trachea, bronchus and lung: Secondary | ICD-10-CM

## 2024-08-10 DIAGNOSIS — R578 Other shock: Secondary | ICD-10-CM | POA: Diagnosis not present

## 2024-08-10 DIAGNOSIS — M19012 Primary osteoarthritis, left shoulder: Secondary | ICD-10-CM | POA: Diagnosis present

## 2024-08-10 DIAGNOSIS — G4733 Obstructive sleep apnea (adult) (pediatric): Secondary | ICD-10-CM | POA: Diagnosis present

## 2024-08-10 DIAGNOSIS — Z951 Presence of aortocoronary bypass graft: Secondary | ICD-10-CM

## 2024-08-10 DIAGNOSIS — R4781 Slurred speech: Secondary | ICD-10-CM | POA: Diagnosis not present

## 2024-08-10 DIAGNOSIS — N19 Unspecified kidney failure: Secondary | ICD-10-CM | POA: Diagnosis not present

## 2024-08-10 DIAGNOSIS — I10 Essential (primary) hypertension: Secondary | ICD-10-CM | POA: Diagnosis present

## 2024-08-10 DIAGNOSIS — R29717 NIHSS score 17: Secondary | ICD-10-CM | POA: Diagnosis not present

## 2024-08-10 DIAGNOSIS — E1122 Type 2 diabetes mellitus with diabetic chronic kidney disease: Secondary | ICD-10-CM | POA: Diagnosis not present

## 2024-08-10 DIAGNOSIS — W19XXXA Unspecified fall, initial encounter: Secondary | ICD-10-CM | POA: Diagnosis not present

## 2024-08-10 DIAGNOSIS — E1140 Type 2 diabetes mellitus with diabetic neuropathy, unspecified: Secondary | ICD-10-CM | POA: Diagnosis present

## 2024-08-10 DIAGNOSIS — J690 Pneumonitis due to inhalation of food and vomit: Secondary | ICD-10-CM

## 2024-08-10 DIAGNOSIS — I080 Rheumatic disorders of both mitral and aortic valves: Secondary | ICD-10-CM | POA: Diagnosis present

## 2024-08-10 DIAGNOSIS — E785 Hyperlipidemia, unspecified: Secondary | ICD-10-CM | POA: Diagnosis present

## 2024-08-10 DIAGNOSIS — E860 Dehydration: Secondary | ICD-10-CM | POA: Diagnosis not present

## 2024-08-10 DIAGNOSIS — N179 Acute kidney failure, unspecified: Secondary | ICD-10-CM | POA: Diagnosis not present

## 2024-08-10 DIAGNOSIS — I161 Hypertensive emergency: Secondary | ICD-10-CM | POA: Diagnosis present

## 2024-08-10 DIAGNOSIS — E875 Hyperkalemia: Secondary | ICD-10-CM | POA: Diagnosis not present

## 2024-08-10 DIAGNOSIS — T4275XA Adverse effect of unspecified antiepileptic and sedative-hypnotic drugs, initial encounter: Secondary | ICD-10-CM | POA: Diagnosis not present

## 2024-08-10 DIAGNOSIS — Z9641 Presence of insulin pump (external) (internal): Secondary | ICD-10-CM | POA: Diagnosis present

## 2024-08-10 DIAGNOSIS — I509 Heart failure, unspecified: Secondary | ICD-10-CM

## 2024-08-10 DIAGNOSIS — Z794 Long term (current) use of insulin: Secondary | ICD-10-CM

## 2024-08-10 DIAGNOSIS — Z833 Family history of diabetes mellitus: Secondary | ICD-10-CM

## 2024-08-10 DIAGNOSIS — Z4682 Encounter for fitting and adjustment of non-vascular catheter: Secondary | ICD-10-CM | POA: Diagnosis not present

## 2024-08-10 DIAGNOSIS — I952 Hypotension due to drugs: Secondary | ICD-10-CM | POA: Diagnosis not present

## 2024-08-10 DIAGNOSIS — E1165 Type 2 diabetes mellitus with hyperglycemia: Secondary | ICD-10-CM | POA: Diagnosis not present

## 2024-08-10 DIAGNOSIS — Z91128 Patient's intentional underdosing of medication regimen for other reason: Secondary | ICD-10-CM

## 2024-08-10 DIAGNOSIS — L89151 Pressure ulcer of sacral region, stage 1: Secondary | ICD-10-CM | POA: Diagnosis not present

## 2024-08-10 DIAGNOSIS — Z8546 Personal history of malignant neoplasm of prostate: Secondary | ICD-10-CM

## 2024-08-10 DIAGNOSIS — L8990 Pressure ulcer of unspecified site, unspecified stage: Secondary | ICD-10-CM

## 2024-08-10 DIAGNOSIS — L89621 Pressure ulcer of left heel, stage 1: Secondary | ICD-10-CM | POA: Diagnosis not present

## 2024-08-10 DIAGNOSIS — Z6833 Body mass index (BMI) 33.0-33.9, adult: Secondary | ICD-10-CM

## 2024-08-10 DIAGNOSIS — M069 Rheumatoid arthritis, unspecified: Secondary | ICD-10-CM | POA: Diagnosis present

## 2024-08-10 DIAGNOSIS — Z8249 Family history of ischemic heart disease and other diseases of the circulatory system: Secondary | ICD-10-CM

## 2024-08-10 DIAGNOSIS — S0990XA Unspecified injury of head, initial encounter: Secondary | ICD-10-CM | POA: Diagnosis not present

## 2024-08-10 DIAGNOSIS — M25511 Pain in right shoulder: Secondary | ICD-10-CM | POA: Diagnosis not present

## 2024-08-10 DIAGNOSIS — N170 Acute kidney failure with tubular necrosis: Secondary | ICD-10-CM | POA: Diagnosis not present

## 2024-08-10 DIAGNOSIS — D61818 Other pancytopenia: Secondary | ICD-10-CM | POA: Diagnosis present

## 2024-08-10 DIAGNOSIS — E669 Obesity, unspecified: Secondary | ICD-10-CM | POA: Diagnosis present

## 2024-08-10 DIAGNOSIS — G935 Compression of brain: Secondary | ICD-10-CM | POA: Diagnosis present

## 2024-08-10 DIAGNOSIS — Z0389 Encounter for observation for other suspected diseases and conditions ruled out: Secondary | ICD-10-CM | POA: Diagnosis not present

## 2024-08-10 DIAGNOSIS — G936 Cerebral edema: Secondary | ICD-10-CM | POA: Diagnosis present

## 2024-08-10 DIAGNOSIS — R4182 Altered mental status, unspecified: Secondary | ICD-10-CM | POA: Diagnosis not present

## 2024-08-10 DIAGNOSIS — R54 Age-related physical debility: Secondary | ICD-10-CM | POA: Diagnosis present

## 2024-08-10 DIAGNOSIS — M19011 Primary osteoarthritis, right shoulder: Secondary | ICD-10-CM | POA: Diagnosis not present

## 2024-08-10 DIAGNOSIS — E870 Hyperosmolality and hypernatremia: Secondary | ICD-10-CM

## 2024-08-10 DIAGNOSIS — R29810 Facial weakness: Secondary | ICD-10-CM | POA: Diagnosis present

## 2024-08-10 DIAGNOSIS — N281 Cyst of kidney, acquired: Secondary | ICD-10-CM | POA: Diagnosis not present

## 2024-08-10 DIAGNOSIS — I619 Nontraumatic intracerebral hemorrhage, unspecified: Secondary | ICD-10-CM | POA: Diagnosis present

## 2024-08-10 DIAGNOSIS — R29722 NIHSS score 22: Secondary | ICD-10-CM | POA: Diagnosis present

## 2024-08-10 DIAGNOSIS — J449 Chronic obstructive pulmonary disease, unspecified: Secondary | ICD-10-CM | POA: Diagnosis present

## 2024-08-10 DIAGNOSIS — I615 Nontraumatic intracerebral hemorrhage, intraventricular: Secondary | ICD-10-CM | POA: Diagnosis not present

## 2024-08-10 DIAGNOSIS — I639 Cerebral infarction, unspecified: Secondary | ICD-10-CM | POA: Diagnosis not present

## 2024-08-10 DIAGNOSIS — R569 Unspecified convulsions: Secondary | ICD-10-CM | POA: Diagnosis not present

## 2024-08-10 DIAGNOSIS — N184 Chronic kidney disease, stage 4 (severe): Principal | ICD-10-CM | POA: Diagnosis present

## 2024-08-10 DIAGNOSIS — R9082 White matter disease, unspecified: Secondary | ICD-10-CM | POA: Diagnosis not present

## 2024-08-10 DIAGNOSIS — R1013 Epigastric pain: Secondary | ICD-10-CM | POA: Diagnosis not present

## 2024-08-10 DIAGNOSIS — I610 Nontraumatic intracerebral hemorrhage in hemisphere, subcortical: Secondary | ICD-10-CM | POA: Diagnosis present

## 2024-08-10 DIAGNOSIS — J9601 Acute respiratory failure with hypoxia: Secondary | ICD-10-CM | POA: Diagnosis not present

## 2024-08-10 DIAGNOSIS — R471 Dysarthria and anarthria: Secondary | ICD-10-CM | POA: Diagnosis present

## 2024-08-10 DIAGNOSIS — J9811 Atelectasis: Secondary | ICD-10-CM | POA: Diagnosis not present

## 2024-08-10 DIAGNOSIS — M1A9XX0 Chronic gout, unspecified, without tophus (tophi): Secondary | ICD-10-CM | POA: Diagnosis present

## 2024-08-10 DIAGNOSIS — Z7982 Long term (current) use of aspirin: Secondary | ICD-10-CM | POA: Diagnosis not present

## 2024-08-10 DIAGNOSIS — N320 Bladder-neck obstruction: Secondary | ICD-10-CM | POA: Diagnosis present

## 2024-08-10 DIAGNOSIS — Z79899 Other long term (current) drug therapy: Secondary | ICD-10-CM

## 2024-08-10 DIAGNOSIS — R339 Retention of urine, unspecified: Secondary | ICD-10-CM | POA: Diagnosis present

## 2024-08-10 DIAGNOSIS — Z8551 Personal history of malignant neoplasm of bladder: Secondary | ICD-10-CM

## 2024-08-10 DIAGNOSIS — F419 Anxiety disorder, unspecified: Secondary | ICD-10-CM | POA: Diagnosis present

## 2024-08-10 DIAGNOSIS — T465X6A Underdosing of other antihypertensive drugs, initial encounter: Secondary | ICD-10-CM | POA: Diagnosis present

## 2024-08-10 DIAGNOSIS — D696 Thrombocytopenia, unspecified: Secondary | ICD-10-CM | POA: Diagnosis not present

## 2024-08-10 DIAGNOSIS — I69391 Dysphagia following cerebral infarction: Secondary | ICD-10-CM | POA: Diagnosis not present

## 2024-08-10 LAB — DIFFERENTIAL
Abs Immature Granulocytes: 0.02 K/uL (ref 0.00–0.07)
Basophils Absolute: 0 K/uL (ref 0.0–0.1)
Basophils Relative: 1 %
Eosinophils Absolute: 0 K/uL (ref 0.0–0.5)
Eosinophils Relative: 1 %
Immature Granulocytes: 1 %
Lymphocytes Relative: 24 %
Lymphs Abs: 0.8 K/uL (ref 0.7–4.0)
Monocytes Absolute: 0.3 K/uL (ref 0.1–1.0)
Monocytes Relative: 10 %
Neutro Abs: 2.1 K/uL (ref 1.7–7.7)
Neutrophils Relative %: 63 %

## 2024-08-10 LAB — I-STAT CHEM 8, ED
BUN: 43 mg/dL — ABNORMAL HIGH (ref 8–23)
Calcium, Ion: 1.19 mmol/L (ref 1.15–1.40)
Chloride: 112 mmol/L — ABNORMAL HIGH (ref 98–111)
Creatinine, Ser: 4.3 mg/dL — ABNORMAL HIGH (ref 0.61–1.24)
Glucose, Bld: 92 mg/dL (ref 70–99)
HCT: 30 % — ABNORMAL LOW (ref 39.0–52.0)
Hemoglobin: 10.2 g/dL — ABNORMAL LOW (ref 13.0–17.0)
Potassium: 4 mmol/L (ref 3.5–5.1)
Sodium: 141 mmol/L (ref 135–145)
TCO2: 18 mmol/L — ABNORMAL LOW (ref 22–32)

## 2024-08-10 LAB — CBC
HCT: 31.9 % — ABNORMAL LOW (ref 39.0–52.0)
Hemoglobin: 10.5 g/dL — ABNORMAL LOW (ref 13.0–17.0)
MCH: 31.1 pg (ref 26.0–34.0)
MCHC: 32.9 g/dL (ref 30.0–36.0)
MCV: 94.4 fL (ref 80.0–100.0)
Platelets: 91 K/uL — ABNORMAL LOW (ref 150–400)
RBC: 3.38 MIL/uL — ABNORMAL LOW (ref 4.22–5.81)
RDW: 13.2 % (ref 11.5–15.5)
WBC: 3.3 K/uL — ABNORMAL LOW (ref 4.0–10.5)
nRBC: 0 % (ref 0.0–0.2)

## 2024-08-10 LAB — COMPREHENSIVE METABOLIC PANEL WITH GFR
ALT: 21 U/L (ref 0–44)
AST: 27 U/L (ref 15–41)
Albumin: 3.5 g/dL (ref 3.5–5.0)
Alkaline Phosphatase: 65 U/L (ref 38–126)
Anion gap: 12 (ref 5–15)
BUN: 45 mg/dL — ABNORMAL HIGH (ref 8–23)
CO2: 17 mmol/L — ABNORMAL LOW (ref 22–32)
Calcium: 8.6 mg/dL — ABNORMAL LOW (ref 8.9–10.3)
Chloride: 111 mmol/L (ref 98–111)
Creatinine, Ser: 4.01 mg/dL — ABNORMAL HIGH (ref 0.61–1.24)
GFR, Estimated: 15 mL/min — ABNORMAL LOW (ref >60)
Glucose, Bld: 95 mg/dL (ref 70–99)
Potassium: 4 mmol/L (ref 3.5–5.1)
Sodium: 140 mmol/L (ref 135–145)
Total Bilirubin: 0.7 mg/dL (ref 0.0–1.2)
Total Protein: 6.6 g/dL (ref 6.5–8.1)

## 2024-08-10 LAB — CBG MONITORING, ED
Glucose-Capillary: 106 mg/dL — ABNORMAL HIGH (ref 70–99)
Glucose-Capillary: 36 mg/dL — CL (ref 70–99)
Glucose-Capillary: 83 mg/dL (ref 70–99)

## 2024-08-10 LAB — PROTIME-INR
INR: 1 (ref 0.8–1.2)
Prothrombin Time: 13.8 s (ref 11.4–15.2)

## 2024-08-10 LAB — ETHANOL: Alcohol, Ethyl (B): <15 mg/dL (ref <15)

## 2024-08-10 LAB — APTT: aPTT: 34 s (ref 24–36)

## 2024-08-10 LAB — GLUCOSE, CAPILLARY: Glucose-Capillary: 74 mg/dL (ref 70–99)

## 2024-08-10 MED ORDER — ACETAMINOPHEN 160 MG/5ML PO SOLN
650.0000 mg | ORAL | Status: DC | PRN
  Administered 2024-08-11 – 2024-08-19 (×10): 650 mg
  Filled 2024-08-10 (×10): qty 20.3

## 2024-08-10 MED ORDER — ACETAMINOPHEN 325 MG PO TABS
650.0000 mg | ORAL_TABLET | ORAL | Status: DC | PRN
  Administered 2024-08-26 – 2024-09-03 (×5): 650 mg via ORAL
  Filled 2024-08-10 (×6): qty 2

## 2024-08-10 MED ORDER — LABETALOL HCL 5 MG/ML IV SOLN
20.0000 mg | Freq: Once | INTRAVENOUS | Status: AC
  Administered 2024-08-10: 20 mg via INTRAVENOUS

## 2024-08-10 MED ORDER — CHLORHEXIDINE GLUCONATE CLOTH 2 % EX PADS
6.0000 | MEDICATED_PAD | Freq: Every day | CUTANEOUS | Status: DC
  Administered 2024-08-11 – 2024-08-25 (×17): 6 via TOPICAL

## 2024-08-10 MED ORDER — ACETAMINOPHEN 650 MG RE SUPP: 650.0000 mg | RECTAL | Status: DC | PRN

## 2024-08-10 MED ORDER — VALPROATE SODIUM 100 MG/ML IV SOLN
500.0000 mg | Freq: Once | INTRAVENOUS | Status: AC
  Administered 2024-08-10: 500 mg via INTRAVENOUS
  Filled 2024-08-10: qty 5

## 2024-08-10 MED ORDER — DEXTROSE 50 % IV SOLN
INTRAVENOUS | Status: AC
  Administered 2024-08-10: 50 mL
  Filled 2024-08-10: qty 50

## 2024-08-10 MED ORDER — CLEVIDIPINE BUTYRATE 0.5 MG/ML IV EMUL
0.0000 mg/h | INTRAVENOUS | Status: DC
  Administered 2024-08-10: 4 mg/h via INTRAVENOUS
  Administered 2024-08-10: 2 mg/h via INTRAVENOUS
  Administered 2024-08-11 (×4): 6 mg/h via INTRAVENOUS
  Administered 2024-08-11 – 2024-08-12 (×5): 8 mg/h via INTRAVENOUS
  Filled 2024-08-10: qty 50
  Filled 2024-08-10: qty 100
  Filled 2024-08-10 (×6): qty 50

## 2024-08-10 MED ORDER — INSULIN ASPART 100 UNIT/ML IJ SOLN
0.0000 [IU] | INTRAMUSCULAR | Status: DC
  Administered 2024-08-11: 2 [IU] via SUBCUTANEOUS
  Administered 2024-08-11: 3 [IU] via SUBCUTANEOUS
  Administered 2024-08-11 (×3): 2 [IU] via SUBCUTANEOUS
  Administered 2024-08-12 (×2): 3 [IU] via SUBCUTANEOUS
  Administered 2024-08-12: 5 [IU] via SUBCUTANEOUS
  Administered 2024-08-12: 3 [IU] via SUBCUTANEOUS
  Administered 2024-08-12: 5 [IU] via SUBCUTANEOUS
  Administered 2024-08-12: 3 [IU] via SUBCUTANEOUS
  Administered 2024-08-12: 5 [IU] via SUBCUTANEOUS
  Administered 2024-08-13: 3 [IU] via SUBCUTANEOUS
  Administered 2024-08-13 (×2): 5 [IU] via SUBCUTANEOUS
  Administered 2024-08-13 (×2): 3 [IU] via SUBCUTANEOUS
  Administered 2024-08-13: 5 [IU] via SUBCUTANEOUS
  Administered 2024-08-14: 3 [IU] via SUBCUTANEOUS
  Administered 2024-08-14: 5 [IU] via SUBCUTANEOUS
  Administered 2024-08-14: 3 [IU] via SUBCUTANEOUS
  Administered 2024-08-14: 5 [IU] via SUBCUTANEOUS
  Administered 2024-08-14: 8 [IU] via SUBCUTANEOUS
  Administered 2024-08-15 (×2): 5 [IU] via SUBCUTANEOUS
  Administered 2024-08-15: 8 [IU] via SUBCUTANEOUS
  Administered 2024-08-15: 4 [IU] via SUBCUTANEOUS
  Administered 2024-08-15: 5 [IU] via SUBCUTANEOUS
  Administered 2024-08-15: 8 [IU] via SUBCUTANEOUS
  Administered 2024-08-16 (×5): 5 [IU] via SUBCUTANEOUS
  Administered 2024-08-16: 3 [IU] via SUBCUTANEOUS
  Administered 2024-08-17 (×2): 5 [IU] via SUBCUTANEOUS
  Administered 2024-08-17: 3 [IU] via SUBCUTANEOUS
  Filled 2024-08-10 (×2): qty 5
  Filled 2024-08-10: qty 8
  Filled 2024-08-10: qty 4
  Filled 2024-08-10 (×2): qty 3
  Filled 2024-08-10: qty 5
  Filled 2024-08-10: qty 3
  Filled 2024-08-10 (×7): qty 5
  Filled 2024-08-10: qty 4
  Filled 2024-08-10: qty 5
  Filled 2024-08-10: qty 8
  Filled 2024-08-10 (×2): qty 5
  Filled 2024-08-10 (×2): qty 3
  Filled 2024-08-10: qty 10
  Filled 2024-08-10: qty 5
  Filled 2024-08-10: qty 8
  Filled 2024-08-10 (×3): qty 3
  Filled 2024-08-10: qty 5
  Filled 2024-08-10: qty 3
  Filled 2024-08-10: qty 2
  Filled 2024-08-10: qty 3
  Filled 2024-08-10: qty 2

## 2024-08-10 MED ORDER — SENNOSIDES-DOCUSATE SODIUM 8.6-50 MG PO TABS: 1.0000 | ORAL_TABLET | Freq: Two times a day (BID) | ORAL | Status: DC

## 2024-08-10 MED ORDER — PANTOPRAZOLE SODIUM 40 MG IV SOLR
40.0000 mg | Freq: Every day | INTRAVENOUS | Status: DC
  Administered 2024-08-10 – 2024-08-22 (×13): 40 mg via INTRAVENOUS
  Filled 2024-08-10 (×13): qty 10

## 2024-08-10 MED ORDER — STROKE: EARLY STAGES OF RECOVERY BOOK
Freq: Once | Status: AC
  Filled 2024-08-10: qty 1

## 2024-08-10 NOTE — ED Triage Notes (Signed)
 Pt bib [facility], states that he was on the phone with his wife at [phone] on his way from work. Family says when he got home he was slurring his speech and then fell. She called ems at 1810. Ems states he has right sided weakness, dysarthria, and ataxia.  BP 198/88 HR 72 Cbg 98 18LAC 18 L wrist

## 2024-08-10 NOTE — H&P (Addendum)
 NEUROLOGY H&P NOTE   Date of service: August 10, 2024 Patient Name: Alec [name] MRN:  [id number] DOB:  1946/09/01 Chief Complaint:  Right-sided weakness  History of Present Illness  Alec [name] is a 78 y.o. male with hx of diabetes, hypertension, CAD, CHF, RA, COPD who presents with right-sided weakness and aphasia.  He spoke to his wife on the phone as he was driving home and sounded normal at 5:38 PM.  When he got home, he was initially seeming normal, but then had trouble sitting in a chair and then started to talk and sounded off. EMS was called and brought him in as a code stroke. He was taken emergently for CT and was found to have a large subcortical hemorrhage.    Last known well: 5:38 PM Modified rankin score: 0 ICH Score: 1 tNKASE: Not offered due to ICH Thrombectomy: not offered due to ICH NIHSS components Score: Comment  1a Level of Conscious 0[]  1[]  2[]  3[]      1b LOC Questions 0[]  1[]  2[]       1c LOC Commands 0[]  1[]  2[]       2 Best Gaze 0[]  1[]  2[]       3 Visual 0[]  1[]  2[]  3[]      4 Facial Palsy 0[]  1[]  2[]  3[]      5a Motor Arm - left 0[]  1[]  2[]  3[]  4[]  UN[]    5b Motor Arm - Right 0[]  1[]  2[]  3[]  4[]  UN[]    6a Motor Leg - Left 0[]  1[]  2[]  3[]  4[]  UN[]    6b Motor Leg - Right 0[]  1[]  2[]  3[]  4[]  UN[]    7 Limb Ataxia 0[]  1[]  2[]  UN[]      8 Sensory 0[]  1[]  2[]  UN[]      9 Best Language 0[]  1[]  2[]  3[]      10 Dysarthria 0[]  1[]  2[]  UN[]      11 Extinct. and Inattention 0[]  1[]  2[]       TOTAL:        Past History   Past Medical History:  Diagnosis Date   Anemia    Anxiety    Aortic insufficiency    Arthritis    Bilateral lower extremity edema    Chronic gout    06-19-2020  per pt last episode 6 months , great toe   CKD (chronic kidney disease), stage IV [facility])    Coronary artery disease cardiologist--- dr hochrein   CABG 2019   Degenerative arthritis of shoulder region 08/2013   left   History of bladder cancer 07/2008   s/p  TURBT   History of  cellulitis 11/2017   left upper arm   History of kidney stones    Hyperlipidemia    Hypertension    followed by pcp/ cardiology   IDDM (insulin  dependent diabetes mellitus)    endocrinologist--- dr von---  pt uses insulin  pump and dexcom  (06-19-2020 per pt fasting sugar-- 98--110)   Insulin  pump in place    Mitral regurgitation    OSA (obstructive sleep apnea)    Prostate cancer [facility]) urologist--- dr herrick/  oncologist--- manning   dx 11/ 2017  Gleason 3+3 active survillance;  bx 03-14-2020 Gleason 3+4   RA (rheumatoid arthritis) (HCC)    rhemotologist--- dr donnis---     S/P CABG x 3 07/15/2018   LIMA to LAD;  SVG to PDA;  SVG to RI   Thrombocytopenia    Past Surgical History:  Procedure Laterality Date   CHOLECYSTECTOMY  08/29/2012   Procedure: LAPAROSCOPIC CHOLECYSTECTOMY  WITH INTRAOPERATIVE CHOLANGIOGRAM;  Surgeon: Donnice POUR. Tsuei, MD;  Location: [facility];  Service: General;  Laterality: N/A;   CORONARY ARTERY BYPASS GRAFT N/A 07/20/2018   Procedure: CORONARY ARTERY BYPASS GRAFTING (CABG) times three using left internal mammary artery to the LAD, and using endoscopically harvested right saphenous vein to PDA and intermedius.;  Surgeon: Army Dallas NOVAK, MD;  Location: [facility] OR;  Service: Open Heart Surgery;  Laterality: N/A;   LEFT HEART CATH AND CORONARY ANGIOGRAPHY N/A 07/15/2018   Procedure: LEFT HEART CATH AND CORONARY ANGIOGRAPHY;  Surgeon: Anner Alm ORN, MD;  Location: [facility] INVASIVE CV LAB;  Service: Cardiovascular;  Laterality: N/A;   RADIOACTIVE SEED IMPLANT N/A 06/21/2020   Procedure: RADIOACTIVE SEED IMPLANT/BRACHYTHERAPY IMPLANT;  Surgeon: Cam Morene ORN, MD;  Location: [facility];  Service: Urology;  Laterality: N/A;   SHOULDER ARTHROSCOPY W/ ROTATOR CUFF REPAIR Right 09/03/2013   SHOULDER ARTHROSCOPY WITH SUBACROMIAL DECOMPRESSION, ROTATOR CUFF REPAIR AND BICEP TENDON REPAIR Left 09/03/2013   Procedure: LEFT SHOULDER ARTHROSCOPY WITH  EXTENSIVE DEBRIDMENT, DISTAL CLAVICULECTOMY, ROTATOR CUFF REPAIR AND SUBACROMIAL DECOMPRESSION PARTIAL ACRIOMIOPLASTY WITH CORACOACROMIAL RELEASE;  Surgeon: Evalene JONETTA Chancy, MD;  Location: [facility];  Service: Orthopedics;  Laterality: Left;   SPACE OAR INSTILLATION N/A 06/21/2020   Procedure: SPACE OAR INSTILLATION;  Surgeon: Cam Morene ORN, MD;  Location: [facility];  Service: Urology;  Laterality: N/A;   TEE WITHOUT CARDIOVERSION N/A 07/20/2018   Procedure: TRANSESOPHAGEAL ECHOCARDIOGRAM (TEE);  Surgeon: Army Dallas NOVAK, MD;  Location: [facility] OR;  Service: Open Heart Surgery;  Laterality: N/A;   TRANSURETHRAL RESECTION OF BLADDER TUMOR WITH MITOMYCIN -C  08/24/2008   @[facility]    Family History  Problem Relation Age of Onset   Diabetes Mother    Lung cancer Mother    Cancer Father    Coronary artery disease Father        MI at age 40.    Lung cancer Father    Sudden death Son    CAD Maternal Grandfather        MI in his 85's   CAD Paternal Grandfather    Breast cancer Neg Hx    Colon cancer Neg Hx    Pancreatic cancer Neg Hx    Social History   Socioeconomic History   Marital status: Married    Spouse name: Not on file   Number of children: Not on file   Years of education: Not on file   Highest education level: Not on file  Occupational History   Not on file  Tobacco Use   Smoking status: Former    Current packs/day: 0.00    Average packs/day: 2.0 packs/day for 25.0 years (50.0 ttl pk-yrs)    Types: Cigarettes    Start date: 09/11/1972    Quit date: 09/11/1997    Years since quitting: 26.9   Smokeless tobacco: Never  Vaping Use   Vaping status: Never Used  Substance and Sexual Activity   Alcohol use: Not Currently    Comment: >20 years ago, no heavy use   Drug use: Never   Sexual activity: Yes    Partners: Female    Comment: MARRIED  Other Topics Concern   Not on file  Social History Narrative   Not on file   Social Drivers of  Health   Financial Resource Strain: Not on file  Food Insecurity: Unknown (03/14/2023)   Hunger Vital Sign    Worried About Running Out of Food in the Last Year:  Never true    Ran Out of Food in the Last Year: Patient declined  Transportation Needs: No Transportation Needs (03/14/2023)   PRAPARE - Administrator, Civil Service (Medical): No    Lack of Transportation (Non-Medical): No  Physical Activity: Not on file  Stress: Not on file  Social Connections: Not on file   Allergies  Allergen Reactions   Nsaids Other (See Comments)    Told to avoid by dr     Medications  (Not in a hospital admission)    Vitals   Vitals:   08/10/24 1918 08/10/24 1920 08/10/24 1932 08/10/24 1934  BP: 127/70 139/76  (!) 169/74  Pulse: 70 70  75  Resp: 15 (!) 21  (!) 22  Temp:   (!) 97.3 F (36.3 C)   TempSrc:   Axillary   SpO2: 98% 98%  98%  Weight:         Body mass index is 33.8 kg/m.  Physical Exam   Constitutional: Appears well-developed and well-nourished.  Psych: He is somewhat agitated.  Eyes: No scleral injection.  HENT: No OP obstruction.  Head: Normocephalic.  Cardiovascular: Normal rate and regular rhythm.  Respiratory: Effort normal, non-labored breathing.  GI: Soft.  No distension. There is no tenderness.  Skin: WDI.   Neurologic Examination   Neuro: Mental Status: Patient is awake, alert, he is densely aphasic  Cranial Nerves: II: does not blink from the right. Pupils are equal, round, and reactive to light.   III,IV, VI: L gaze deviation, does not cross to the right.  VII: Facial movement is mildly weak on the right Motor: He has minimal withdrawal to noxious stimulation in the right upper extremity as well as small amount of withdrawal in the right lower extremity, he moves left side well sensory: He grimaces less on the right than left Cerebellar: He does not perform       Labs   CBC:  Recent Labs  Lab 08/10/24 1901  WBC 3.3*  HGB  10.5*  10.2*  HCT 31.9*  30.0*  MCV 94.4  PLT PENDING    Basic Metabolic Panel:  Lab Results  Component Value Date   NA 140 08/10/2024   NA 141 08/10/2024   K 4.0 08/10/2024   K 4.0 08/10/2024   CO2 17 (L) 08/10/2024   GLUCOSE 95 08/10/2024   GLUCOSE 92 08/10/2024   BUN 45 (H) 08/10/2024   BUN 43 (H) 08/10/2024   CREATININE 4.01 (H) 08/10/2024   CREATININE 4.30 (H) 08/10/2024   CALCIUM  8.6 (L) 08/10/2024   GFRNONAA 15 (L) 08/10/2024   GFRAA 40 (L) 06/19/2020   Lipid Panel:  Lab Results  Component Value Date   LDLCALC 57 03/29/2022   HgbA1c:  Lab Results  Component Value Date   HGBA1C 6.1 (A) 05/13/2024   Alcohol Level     Component Value Date/Time   [facility] <15 08/10/2024 1902   INR  Lab Results  Component Value Date   INR 1.0 05/15/2023   APTT  Lab Results  Component Value Date   APTT 33 06/19/2020     CT Head without contrast(Personally reviewed): Moderate size subcortical bleed on the left  Impression   Alec [name] is a 78 y.o. male with a history of CKD, hypertension, hyperlipidemia, diabetes type 2 (uses insulin  pump) who presents with right-sided weakness and aphasia that was sudden onset.  His CT confirms a subcortical hemorrhage which I suspect is hypertensive in etiology.  He will  need to be monitored closely in the ICU given his size and his hypertension.  I discussed CODE STATUS with his wife and he is full code.  Primary Diagnosis:  Nontraumatic intracerebral hemorrhage in hemisphere, subcortical  Secondary Diagnosis: Brain compression, Cerebral edema, Hypertension Emergency (SBP > 180 or DBP > 120 & end organ damage), Type 2 diabetes mellitus w/o complications, Obesity, and CKD Stage 4 (GFR 15-29)  Recommendations  1) Admit to ICU 2) no antiplatelets or anticoagulants 3) blood pressure control with goal systolic 130 - 150 4) Frequent neuro checks 5) If symptoms worsen or there is decreased mental status, repeat stat head CT 6)  PT,OT,ST 7) clevidipine for BP control 8) discontinue insulin  pump given risk for hypoglycemia while n.p.o., start SSI and I have placed a diabetes management consult for insulin  pump management. ______________________________________________________________________    This patient is critically ill and at significant risk of neurological worsening, death and care requires constant monitoring of vital signs, hemodynamics,respiratory and cardiac monitoring, neurological assessment, discussion with family, other specialists and medical decision making of high complexity. I spent 65 minutes of neurocritical care time  in the care of  this patient. This was time spent independent of any time provided by nurse practitioner or PA.  Aisha Seals, MD [facility]   If 7pm- 7am, please page neurology on call as listed in AMION. 08/10/2024  8:21 PM

## 2024-08-10 NOTE — ED Provider Notes (Signed)
 [place] EMERGENCY DEPARTMENT AT [facility] Provider Note   CSN: [id number] Arrival date & time: 08/10/24  1854     Patient presents with: Code Stroke   Alec [name] is a 78 y.o. male.   78 year old male with a history of CKD, diabetes, CAD status post CABG who presents emergency department as code stroke.  Last known well at 1738 today.  Family reported that he sat down and was not responding normally and was not using his right side.  Not complaining of a headache.  Noticed to be weak on his right side and so a code stroke was activated by EMS.  Patient unable to give additional history because he is aphasic.  Not on blood thinners per family and EMS.       Prior to Admission medications   Medication Sig Start Date End Date Taking? Authorizing Provider  albuterol  (VENTOLIN  HFA) 108 (90 Base) MCG/ACT inhaler Inhale 2 puffs into the lungs every 6 (six) hours as needed for wheezing or shortness of breath. 06/28/24   Charley Conger, PA-C  allopurinol  (ZYLOPRIM ) 100 MG tablet Take 0.5 tablets (50 mg total) by mouth daily. 03/19/23 07/27/24  Gonfa, Taye T, MD  aspirin  EC 81 MG tablet Take 81 mg by mouth daily.    [provider]  atorvastatin  (LIPITOR) 40 MG tablet TAKE 1 TABLET BY MOUTH EVERY DAY 04/23/24   Lavona Agent, MD  benzonatate  (TESSALON ) 200 MG capsule Take 1 capsule (200 mg total) by mouth 3 (three) times daily as needed for cough. 07/27/24   Charley Conger, PA-C  Boswellia-Glucosamine-Vit D (OSTEO BI-FLEX ONE PER DAY PO) Take 1 tablet by mouth daily.    [provider]  Cholecalciferol  (VITAMIN D3 PO) Take 1 capsule by mouth daily.    [provider]  CINNAMON PO Take 500 mg by mouth daily.    [provider]  furosemide  (LASIX ) 20 MG tablet Take 1 tablet (20 mg total) by mouth daily. 04/12/24   Lavona Agent, MD  gabapentin  (NEURONTIN ) 100 MG capsule Take 1 capsule (100 mg total) by mouth 3 (three) times daily as needed (pain).  05/16/23   Long, Fonda MATSU, MD  hydrALAZINE  (APRESOLINE ) 100 MG tablet Take 1 tablet (100 mg total) by mouth 3 (three) times daily. 03/29/22   Ricky Fines, MD  insulin  aspart (NOVOLOG ) 100 UNIT/ML injection USE A MAX OF 100 UNITS DAILY VIA INSULIN  PUMP 05/13/24   Thapa, Sudan, MD  Insulin  Human (INSULIN  PUMP) SOLN Inject into the skin.    [provider]  isosorbide  mononitrate (IMDUR ) 120 MG 24 hr tablet TAKE 1 TABLET BY MOUTH EVERY DAY 01/07/24   Lavona Agent, MD  latanoprost  (XALATAN ) 0.005 % ophthalmic solution Place 1 drop into both eyes at bedtime. 09/18/21   [provider]  metoprolol  tartrate (LOPRESSOR ) 50 MG tablet TAKE 1 TABLET BY MOUTH TWICE A DAY 08/11/23   Lavona Agent, MD  Multiple Vitamins-Minerals (CENTRUM SILVER PO) Take 1 tablet by mouth daily.    [provider]  nitroGLYCERIN  (NITROSTAT ) 0.4 MG SL tablet Place 1 tablet (0.4 mg total) under the tongue every 5 (five) minutes as needed for chest pain. 10/26/21 07/27/24  Lavona Agent, MD  olmesartan  (BENICAR ) 5 MG tablet Take 1 tablet (5 mg total) by mouth daily. 03/24/23   Gonfa, Taye T, MD  omeprazole  (PRILOSEC) 20 MG capsule Take 1 capsule (20 mg total) by mouth daily. 05/15/23   Theadore Ozell HERO, MD  ondansetron  (ZOFRAN -ODT) 4 MG  disintegrating tablet Take 1 tablet (4 mg total) by mouth every 8 (eight) hours as needed for nausea or vomiting. 05/27/23   Dreama Longs, MD  sertraline  (ZOLOFT ) 100 MG tablet TAKE 1 TABLET BY MOUTH EVERY DAY 04/09/24   Lavona Agent, MD  tamsulosin  (FLOMAX ) 0.4 MG CAPS capsule Take 0.4 mg by mouth at bedtime.    [provider]    Allergies: Nsaids    Review of Systems  Updated Vital Signs BP (!) 121/57   Pulse 64   Temp (!) 97.3 F (36.3 C) (Axillary)   Resp 17   Wt 95 kg   SpO2 96%   BMI 33.80 kg/m   Physical Exam Vitals and nursing note reviewed.  Constitutional:      General: He is in acute distress.     Appearance: He is well-developed.  He is ill-appearing.     Comments: Somnolent but appears to be protecting his airway.  HENT:     Head: Normocephalic and atraumatic.     Right Ear: External ear normal.     Left Ear: External ear normal.     Nose: Nose normal.  Eyes:     Extraocular Movements: Extraocular movements intact.     Conjunctiva/sclera: Conjunctivae normal.     Pupils: Pupils are equal, round, and reactive to light.     Comments: Pupil 4 mm bilaterally  Pulmonary:     Effort: Pulmonary effort is normal. No respiratory distress.     Breath sounds: Normal breath sounds.  Musculoskeletal:     Cervical back: Normal range of motion and neck supple.     Right lower leg: No edema.     Left lower leg: No edema.  Skin:    General: Skin is warm and dry.  Neurological:     Mental Status: He is alert.     Comments: Aphasic.  Left-sided gaze preference.  Weakness of right upper extremity and right lower extremity.  Right-sided facial droop.  Unable to cooperate with exam otherwise  Psychiatric:        Mood and Affect: Mood normal.        Behavior: Behavior normal.     (all labs ordered are listed, but only abnormal results are displayed) Labs Reviewed  CBC - Abnormal; Notable for the following components:      Result Value   WBC 3.3 (*)    RBC 3.38 (*)    Hemoglobin 10.5 (*)    HCT 31.9 (*)    Platelets 91 (*)    All other components within normal limits  COMPREHENSIVE METABOLIC PANEL WITH GFR - Abnormal; Notable for the following components:   CO2 17 (*)    BUN 45 (*)    Creatinine, Ser 4.01 (*)    Calcium  8.6 (*)    GFR, Estimated 15 (*)    All other components within normal limits  I-STAT CHEM 8, ED - Abnormal; Notable for the following components:   Chloride 112 (*)    BUN 43 (*)    Creatinine, Ser 4.30 (*)    TCO2 18 (*)    Hemoglobin 10.2 (*)    HCT 30.0 (*)    All other components within normal limits  CBG MONITORING, ED - Abnormal; Notable for the following components:   Glucose-Capillary  36 (*)    All other components within normal limits  CBG MONITORING, ED - Abnormal; Notable for the following components:   Glucose-Capillary 106 (*)    All other components within normal  limits  PROTIME-INR  APTT  DIFFERENTIAL  ETHANOL    EKG: None  Radiology: CT HEAD CODE STROKE WO CONTRAST Result Date: 08/10/2024 CLINICAL DATA:  Code stroke. Initial evaluation for acute neuro deficit, stroke suspected. EXAM: CT HEAD WITHOUT CONTRAST TECHNIQUE: Contiguous axial images were obtained from the base of the skull through the vertex without intravenous contrast. RADIATION DOSE REDUCTION: This exam was performed according to the departmental dose-optimization program which includes automated exposure control, adjustment of the mA and/or kV according to patient size and/or use of iterative reconstruction technique. COMPARISON:  None Available. FINDINGS: Brain: Acute intraparenchymal hemorrhage centered at the left basal ganglia measures 3.0 x 2.8 x 2.6 cm (estimated volume 11 mL). Mild surrounding edema with partial effacement of the left lateral ventricle, but no significant midline shift. No intraventricular extension or other complication. No other acute intracranial hemorrhage. No other acute large vessel territory infarct. No mass lesion. No extra-axial fluid collection. No hydrocephalus. Vascular: No abnormal hyperdense vessel. Calcified atherosclerosis present at skull base. Skull: Scalp soft tissues and calvarium demonstrate no acute finding. Sinuses/Orbits: Globes orbital soft tissues grossly within normal limits. Mild chronic mucoperiosteal thickening present about the ethmoidal air cells and maxillary sinuses. No significant mastoid effusion. Other: None. ASPECTS [facility] Stroke Program Early CT Score) Acute ICH, does not apply. IMPRESSION: 3.0 x 2.8 x 2.6 cm (estimated volume 11 mL) acute intraparenchymal hemorrhage centered at the left basal ganglia. Mild surrounding edema with partial  effacement of the left lateral ventricle, but no significant midline shift. No intraventricular extension or other complication. These results were communicated to Dr. Michaela at 7:08 pm on 08/10/2024 by text page via the [facility] messaging system. Electronically Signed   By: Morene Hoard M.D.   On: 08/10/2024 19:09     Procedures   Medications Ordered in the ED  labetalol (NORMODYNE) injection 20 mg (20 mg Intravenous Given 08/10/24 1927)    And  clevidipine (CLEVIPREX) infusion 0.5 mg/mL (8 mg/hr Intravenous Infusion Verify 08/10/24 2003)   stroke: early stages of recovery book (has no administration in time range)  acetaminophen  (TYLENOL ) tablet 650 mg (has no administration in time range)    Or  acetaminophen  (TYLENOL ) 160 MG/5ML solution 650 mg (has no administration in time range)    Or  acetaminophen  (TYLENOL ) suppository 650 mg (has no administration in time range)  senna-docusate (Senokot-S) tablet 1 tablet (has no administration in time range)  pantoprazole  (PROTONIX ) injection 40 mg (has no administration in time range)  insulin  aspart (novoLOG ) injection 0-15 Units ( Subcutaneous Not Given 08/10/24 1958)  valproate (DEPACON) 500 mg in dextrose  5 % 50 mL IVPB (has no administration in time range)  dextrose  50 % solution (50 mLs  Given 08/10/24 1956)                                    Medical Decision Making Amount and/or Complexity of Data Reviewed Labs: ordered. Radiology: ordered.  Risk Decision regarding hospitalization.   78 year old male with a history of CKD, diabetes, CAD status post CABG who presents emergency department as code stroke.  Initial Ddx:  Ischemic stroke, hemorrhagic stroke, hypoglycemia, seizure, Todd's paralysis  MDM/Course:  Patient presents emergency department as a code stroke.  Does have flaccid right-sided paralysis and is aphasic.  Has a leftward gaze preference. Is within the window for TNK as well as thrombectomy so code  stroke was activated by EMS.  Also evaluated by Dr. Michaela from neurology. Currently aphasic but appears to be protecting his airway currently.  Does have flaccid right-sided paralysis with aphasia.  Was noted to be mildly hypertensive to the 160s systolic.  CT head does show that he has a left-sided intraparenchymal hemorrhage that is likely causing his symptoms.  Was given labetalol for his blood pressure.  Started on clevidipine as well.  Was found to be hypoglycemic and had an insulin  pump on him so was given a D50 bolus and insulin  pump removed.  Upon re-evaluation remained critically ill but protecting his airway.  Repeat glucose 106.  Admitted to the neuro ICU for further management.  This patient presents to the ED for concern of complaints listed in HPI, this involves an extensive number of treatment options, and is a complaint that carries with it a high risk of complications and morbidity. Disposition including potential need for admission considered.   Dispo: ICU  Additional history obtained from EMS Records reviewed Outpatient Clinic Notes The following labs were independently interpreted: Chemistry and show CKD I independently reviewed the following imaging with scope of interpretation limited to determining acute life threatening conditions related to emergency care: CT Head and agree with the radiologist interpretation with the following exceptions: none I personally reviewed and interpreted cardiac monitoring: normal sinus rhythm  I personally reviewed and interpreted the pt's EKG: see above for interpretation  I have reviewed the patients home medications and made adjustments as needed Consults: Neurology Social Determinants of health:  Geriatric  CRITICAL CARE Performed by: Lamar JAYSON Shan   Total critical care time: 30 minutes  Critical care time was exclusive of separately billable procedures and treating other patients.  Critical care was necessary to treat or  prevent imminent or life-threatening deterioration.  Critical care was time spent personally by me on the following activities: development of treatment plan with patient and/or surrogate as well as nursing, discussions with consultants, evaluation of patient's response to treatment, examination of patient, obtaining history from patient or surrogate, ordering and performing treatments and interventions, ordering and review of laboratory studies, ordering and review of radiographic studies, pulse oximetry and re-evaluation of patient's condition.  Portions of this note were generated with Scientist, clinical (histocompatibility and immunogenetics). Dictation errors may occur despite best attempts at proofreading.     Final diagnoses:  Left-sided nontraumatic intracerebral hemorrhage, unspecified cerebral location [facility])    ED Discharge Orders     None          Shan Lamar JAYSON, MD 08/10/24 2033

## 2024-08-11 ENCOUNTER — Inpatient Hospital Stay (HOSPITAL_COMMUNITY)

## 2024-08-11 DIAGNOSIS — R29818 Other symptoms and signs involving the nervous system: Secondary | ICD-10-CM | POA: Diagnosis not present

## 2024-08-11 DIAGNOSIS — R29717 NIHSS score 17: Secondary | ICD-10-CM | POA: Diagnosis not present

## 2024-08-11 DIAGNOSIS — I69391 Dysphagia following cerebral infarction: Secondary | ICD-10-CM

## 2024-08-11 DIAGNOSIS — I619 Nontraumatic intracerebral hemorrhage, unspecified: Secondary | ICD-10-CM | POA: Diagnosis not present

## 2024-08-11 DIAGNOSIS — I10 Essential (primary) hypertension: Secondary | ICD-10-CM

## 2024-08-11 DIAGNOSIS — Z7982 Long term (current) use of aspirin: Secondary | ICD-10-CM

## 2024-08-11 DIAGNOSIS — I639 Cerebral infarction, unspecified: Secondary | ICD-10-CM | POA: Diagnosis not present

## 2024-08-11 DIAGNOSIS — I161 Hypertensive emergency: Secondary | ICD-10-CM | POA: Diagnosis not present

## 2024-08-11 DIAGNOSIS — Z794 Long term (current) use of insulin: Secondary | ICD-10-CM

## 2024-08-11 DIAGNOSIS — I610 Nontraumatic intracerebral hemorrhage in hemisphere, subcortical: Secondary | ICD-10-CM | POA: Diagnosis not present

## 2024-08-11 DIAGNOSIS — I130 Hypertensive heart and chronic kidney disease with heart failure and stage 1 through stage 4 chronic kidney disease, or unspecified chronic kidney disease: Secondary | ICD-10-CM | POA: Diagnosis not present

## 2024-08-11 DIAGNOSIS — E785 Hyperlipidemia, unspecified: Secondary | ICD-10-CM

## 2024-08-11 LAB — CBC
HCT: 33.1 % — ABNORMAL LOW (ref 39.0–52.0)
Hemoglobin: 11.1 g/dL — ABNORMAL LOW (ref 13.0–17.0)
MCH: 31.4 pg (ref 26.0–34.0)
MCHC: 33.5 g/dL (ref 30.0–36.0)
MCV: 93.8 fL (ref 80.0–100.0)
Platelets: 88 K/uL — ABNORMAL LOW (ref 150–400)
RBC: 3.53 MIL/uL — ABNORMAL LOW (ref 4.22–5.81)
RDW: 13.1 % (ref 11.5–15.5)
WBC: 3.4 K/uL — ABNORMAL LOW (ref 4.0–10.5)
nRBC: 0 % (ref 0.0–0.2)

## 2024-08-11 LAB — GLUCOSE, CAPILLARY
Glucose-Capillary: 114 mg/dL — ABNORMAL HIGH (ref 70–99)
Glucose-Capillary: 126 mg/dL — ABNORMAL HIGH (ref 70–99)
Glucose-Capillary: 127 mg/dL — ABNORMAL HIGH (ref 70–99)
Glucose-Capillary: 134 mg/dL — ABNORMAL HIGH (ref 70–99)
Glucose-Capillary: 144 mg/dL — ABNORMAL HIGH (ref 70–99)
Glucose-Capillary: 147 mg/dL — ABNORMAL HIGH (ref 70–99)
Glucose-Capillary: 169 mg/dL — ABNORMAL HIGH (ref 70–99)
Glucose-Capillary: 188 mg/dL — ABNORMAL HIGH (ref 70–99)

## 2024-08-11 LAB — RAPID URINE DRUG SCREEN, HOSP PERFORMED
Amphetamines: NOT DETECTED
Barbiturates: NOT DETECTED
Benzodiazepines: NOT DETECTED
Cocaine: NOT DETECTED
Opiates: NOT DETECTED
Tetrahydrocannabinol: NOT DETECTED

## 2024-08-11 LAB — BASIC METABOLIC PANEL WITH GFR
Anion gap: 11 (ref 5–15)
BUN: 44 mg/dL — ABNORMAL HIGH (ref 8–23)
CO2: 18 mmol/L — ABNORMAL LOW (ref 22–32)
Calcium: 8.7 mg/dL — ABNORMAL LOW (ref 8.9–10.3)
Chloride: 109 mmol/L (ref 98–111)
Creatinine, Ser: 3.72 mg/dL — ABNORMAL HIGH (ref 0.61–1.24)
GFR, Estimated: 16 mL/min — ABNORMAL LOW (ref >60)
Glucose, Bld: 160 mg/dL — ABNORMAL HIGH (ref 70–99)
Potassium: 4.8 mmol/L (ref 3.5–5.1)
Sodium: 138 mmol/L (ref 135–145)

## 2024-08-11 LAB — ECHOCARDIOGRAM COMPLETE
Area-P 1/2: 3.3 cm2
S' Lateral: 3.8 cm
Weight: 3350.99 [oz_av]

## 2024-08-11 LAB — PHOSPHORUS: Phosphorus: 3.8 mg/dL (ref 2.5–4.6)

## 2024-08-11 LAB — MRSA NEXT GEN BY PCR, NASAL: MRSA by PCR Next Gen: NOT DETECTED

## 2024-08-11 LAB — MAGNESIUM: Magnesium: 2.2 mg/dL (ref 1.7–2.4)

## 2024-08-11 MED ORDER — ALLOPURINOL 100 MG PO TABS
50.0000 mg | ORAL_TABLET | Freq: Every day | ORAL | Status: DC
  Administered 2024-08-11 – 2024-08-23 (×13): 50 mg
  Filled 2024-08-11: qty 0.5
  Filled 2024-08-11: qty 1
  Filled 2024-08-11 (×6): qty 0.5
  Filled 2024-08-11 (×4): qty 1
  Filled 2024-08-11: qty 0.5

## 2024-08-11 MED ORDER — HYDRALAZINE HCL 50 MG PO TABS
50.0000 mg | ORAL_TABLET | Freq: Two times a day (BID) | ORAL | Status: DC
  Administered 2024-08-12: 50 mg
  Filled 2024-08-11: qty 1

## 2024-08-11 MED ORDER — ATORVASTATIN CALCIUM 40 MG PO TABS
40.0000 mg | ORAL_TABLET | Freq: Every day | ORAL | Status: DC
  Administered 2024-08-11 – 2024-08-23 (×13): 40 mg
  Filled 2024-08-11 (×13): qty 1

## 2024-08-11 MED ORDER — LATANOPROST 0.005 % OP SOLN
1.0000 [drp] | Freq: Every day | OPHTHALMIC | Status: DC
  Administered 2024-08-11 – 2024-09-03 (×24): 1 [drp] via OPHTHALMIC
  Filled 2024-08-11 (×3): qty 2.5

## 2024-08-11 MED ORDER — JEVITY 1.5 CAL/FIBER PO LIQD
1000.0000 mL | ORAL | Status: DC
  Administered 2024-08-11: 1000 mL
  Filled 2024-08-11 (×2): qty 1000

## 2024-08-11 MED ORDER — SERTRALINE HCL 50 MG PO TABS
50.0000 mg | ORAL_TABLET | Freq: Every day | ORAL | Status: DC
  Administered 2024-08-11 – 2024-08-23 (×13): 50 mg
  Filled 2024-08-11 (×15): qty 1

## 2024-08-11 MED ORDER — HYDRALAZINE HCL 50 MG PO TABS
100.0000 mg | ORAL_TABLET | Freq: Every day | ORAL | Status: DC
  Administered 2024-08-11: 100 mg
  Filled 2024-08-11: qty 2

## 2024-08-11 MED ORDER — AMLODIPINE BESYLATE 10 MG PO TABS
10.0000 mg | ORAL_TABLET | Freq: Every day | ORAL | Status: DC
  Administered 2024-08-11 – 2024-08-17 (×7): 10 mg
  Filled 2024-08-11 (×2): qty 1
  Filled 2024-08-11: qty 2
  Filled 2024-08-11 (×2): qty 1
  Filled 2024-08-11 (×2): qty 2

## 2024-08-11 MED ORDER — SERTRALINE HCL 50 MG PO TABS: 50.0000 mg | ORAL_TABLET | Freq: Every day | ORAL | Status: DC

## 2024-08-11 MED ORDER — HYDRALAZINE HCL 100 MG PO TABS: 50.0000 mg | ORAL_TABLET | Freq: Three times a day (TID) | ORAL | Status: DC

## 2024-08-11 MED ORDER — SENNOSIDES-DOCUSATE SODIUM 8.6-50 MG PO TABS
1.0000 | ORAL_TABLET | Freq: Two times a day (BID) | ORAL | Status: DC
  Administered 2024-08-11 – 2024-08-18 (×12): 1
  Filled 2024-08-11 (×15): qty 1

## 2024-08-11 MED ORDER — PROSOURCE TF20 ENFIT COMPATIBL EN LIQD
60.0000 mL | Freq: Every day | ENTERAL | Status: DC
  Administered 2024-08-11 – 2024-08-23 (×12): 60 mL
  Filled 2024-08-11 (×12): qty 60

## 2024-08-11 NOTE — TOC CAGE-AID Note (Signed)
 Transition of Care [facility]) - CAGE-AID Screening   Patient Details  Name: Alec [name] MRN: [id number] Date of Birth: 12-03-1945  Transition of [facility]) CM/SW Contact:    Inocente GORMAN Kindle, LCSW Phone Number: 08/11/2024, 9:43 AM   Clinical Narrative: Patient is disoriented and unable to participate in screening.    CAGE-AID Screening: Substance Abuse Screening unable to be completed due to: : Patient unable to participate

## 2024-08-11 NOTE — Progress Notes (Signed)
 CGM removed and placed in cup at bedside.

## 2024-08-11 NOTE — TOC CM/SW Note (Signed)
 Transition of Care [facility]) - Inpatient Brief Assessment   Patient Details  Name: Alec [name] MRN: [id number] Date of Birth: [birth year]  Transition of Care [facility]) CM/SW Contact:    Yosselin Zoeller M, RN Phone Number: 08/11/2024, 5:13 PM   Clinical Narrative: Alec [name] is a 78 y.o. male with history of diabetes, hypertension, CAD, CHF, rheumatoid arthritis and COPD admitted for right-sided weakness and aphasia.  He was found to have a left subcortical ICH.   Await PT/OT evaluations; Inpatient Care Management will follow as patient progresses.    Transition of Care Asessment: Insurance and Status: Insurance coverage has been reviewed Patient has primary care physician: Yes Lenis Gentry) Home environment has been reviewed: Lives with spouse Prior level of function:: Independent, drives Prior/Current Home Services: No current home services Social Drivers of Health Review: SDOH reviewed no interventions necessary Readmission risk has been reviewed: Yes Transition of care needs: no transition of care needs at this time  Mliss MICAEL Fass, RN, BSN  Trauma/Neuro ICU Case Manager [phone]

## 2024-08-11 NOTE — Inpatient Diabetes Management (Signed)
 Inpatient Diabetes Program Recommendations  AACE/ADA: New Consensus Statement on Inpatient Glycemic Control (2015)  Target Ranges:  Prepandial:   less than 140 mg/dL      Peak postprandial:   less than 180 mg/dL (1-2 hours)      Critically ill patients:  140 - 180 mg/dL   Lab Results  Component Value Date   GLUCAP 147 (H) 08/11/2024   HGBA1C 6.1 (A) 05/13/2024    Review of Glycemic Control  Latest Reference Range & Units 08/10/24 20:17 08/10/24 20:45 08/10/24 22:41 08/11/24 00:27 08/11/24 03:29 08/11/24 07:46  Glucose-Capillary 70 - 99 mg/dL 893 (H) 83 74 885 (H) 855 (H) 147 (H)   Diabetes history: DM 2 Outpatient Diabetes medications:  Insulin  pump- T slim nsulin Pump setting:  Basal ( total 54.750 units) MN-2.0u/hour 2AM-    1.8  7AM-2.250 4PM-2.7 7PM-   3.0 10PM- 2.20   Bolus CHO Ratio (1unit:CHO) MN-1:8   No exact carb counting, he has been using 20-80 carb count based on meal size.   Correction/Sensitivity: MN-1:30 10PM-1:35   Target: 110   Active insulin  time: 5 hours Current orders for Inpatient glycemic control:  Novolog  moderate q 4 hours  Inpatient Diabetes Program Recommendations:    Spoke to patient's wife at bedside.  Patient was sleeping soundly.  She states that blood sugar was 91 mg/dL when EMS came.  Wife states that blood sugar was low on arrival and pump was removed.  Patient has Type 2 DM, therefore does not carry risk of DKA.  Blood sugars are currently within hospital goal with only Novolog  correction.  He is wearing Dexcom sensor on right arm (it was changed yesterday).  Will follow. May need basal insulin  added if CBG's increase.    Thanks,  Randall Bullocks, RN, BC-ADM Inpatient Diabetes Coordinator Pager [phone]  (8a-5p)

## 2024-08-11 NOTE — Progress Notes (Signed)
 Echocardiogram 2D Echocardiogram has been performed.  Alec [name] 08/11/2024, 8:39 AM

## 2024-08-11 NOTE — Progress Notes (Signed)
 Carotid duplex has been completed.   Results can be found under chart review under CV PROC. 08/11/2024 12:27 PM Tiaja Hagan RVT, RDMS

## 2024-08-11 NOTE — Progress Notes (Addendum)
 STROKE TEAM PROGRESS NOTE    SIGNIFICANT HOSPITAL EVENTS 11/11: Patient admitted with left subcortical ICH  INTERIM HISTORY/SUBJECTIVE  Patient continues to require Cleviprex to maintain blood pressure within parameters.  He has remained afebrile overnight.  OBJECTIVE  CBC    Component Value Date/Time   WBC 3.4 (L) 08/11/2024 0926   RBC 3.53 (L) 08/11/2024 0926   HGB 11.1 (L) 08/11/2024 0926   HGB 12.1 (L) 10/26/2021 0902   HCT 33.1 (L) 08/11/2024 0926   HCT 33.7 (L) 10/26/2021 0902   PLT 88 (L) 08/11/2024 0926   PLT 128 (L) 10/26/2021 0902   MCV 93.8 08/11/2024 0926   MCV 92 10/26/2021 0902   MCH 31.4 08/11/2024 0926   MCHC 33.5 08/11/2024 0926   RDW 13.1 08/11/2024 0926   RDW 13.6 10/26/2021 0902   LYMPHSABS 0.8 08/10/2024 1901   LYMPHSABS 1.5 10/26/2021 0902   MONOABS 0.3 08/10/2024 1901   EOSABS 0.0 08/10/2024 1901   EOSABS 0.1 10/26/2021 0902   BASOSABS 0.0 08/10/2024 1901   BASOSABS 0.1 10/26/2021 0902    BMET    Component Value Date/Time   NA 138 08/11/2024 0926   NA 141 04/04/2022 1232   K 4.8 08/11/2024 0926   CL 109 08/11/2024 0926   CO2 18 (L) 08/11/2024 0926   GLUCOSE 160 (H) 08/11/2024 0926   BUN 44 (H) 08/11/2024 0926   BUN 63 (H) 04/04/2022 1232   CREATININE 3.72 (H) 08/11/2024 0926   CREATININE 3.61 (H) 01/15/2024 0840   CALCIUM  8.7 (L) 08/11/2024 0926   EGFR 16.0 04/30/2024 0917   EGFR 17 (L) 01/15/2024 0840   EGFR 17 (L) 04/04/2022 1232   GFRNONAA 16 (L) 08/11/2024 0926    IMAGING past 24 hours CT HEAD POST STROKE FOLLOWUP/TIMED/STAT READ Result Date: 08/11/2024 EXAM: CT HEAD WITHOUT CONTRAST 08/11/2024 12:20:00 PM TECHNIQUE: CT of the head was performed without the administration of intravenous contrast. Automated exposure control, iterative reconstruction, and/or weight based adjustment of the mA/kV was utilized to reduce the radiation dose to as low as reasonably achievable. COMPARISON: 08/11/2024 CLINICAL HISTORY: Neuro deficit,  acute, stroke suspected. FINDINGS: BRAIN AND VENTRICLES: An amorphous area of intraparenchymal hemorrhage within the left basal ganglia has not clearly changed in size or appearance in the interim, again measuring approximately 3.6 x 3.2 x 2.9 cm. Mild surrounding edema. Rightward midline shift measuring 4 mm. Effacement of left lateral ventricle. Vascular calcifications. No evidence of acute infarct. No extra-axial collection. ORBITS: Bilateral lens replacement. SINUSES: Clear paranasal sinuses. SOFT TISSUES AND SKULL: No acute soft tissue abnormality. No skull fracture. IMPRESSION: 1. Amorphous area of intraparenchymal hemorrhage within the left basal ganglia, measuring approximately 3.6 x 3.2 x 2.9 cm, with mild surrounding edema and rightward midline shift measuring 4 mm. No clear change in size or appearance compared to the prior study. 2. Effacement of the left lateral ventricle. Electronically signed by: Evalene Coho MD 08/11/2024 12:32 PM EST RP Workstation: [id number]   VAS US  CAROTID Result Date: 08/11/2024 Carotid Arterial Duplex Study Patient Name:  Alec [name] [facility]  Date of Exam:   08/11/2024 Medical Rec #: [id number]       Accession #:    [id number] Date of Birth: 1945/11/16      Patient Gender: M Patient Age:   78 years Exam Location:  [facility] Procedure:      VAS US  CAROTID Referring Phys: ARY CUMMINS --------------------------------------------------------------------------------  Indications:       CVA. Risk Factors:  Hypertension, hyperlipidemia, Diabetes, past history of                    smoking, coronary artery disease. Other Factors:     Hx of CABG x3, CKD. Limitations        Today's exam was limited due to VERY difficult exam due to                    continuous snoring/respiratory variation and scanning                    environment (glare from sunlight). Comparison Study:  previous exam 07/16/2018 Performing Technologist: Ezzie Potters RVT, RDMS  Examination Guidelines: A  complete evaluation includes B-mode imaging, spectral Doppler, color Doppler, and power Doppler as needed of all accessible portions of each vessel. Bilateral testing is considered an integral part of a complete examination. Limited examinations for reoccurring indications may be performed as noted.  Right Carotid Findings: +----------+--------+--------+--------+------------------+------------------+           PSV cm/sEDV cm/sStenosisPlaque DescriptionComments           +----------+--------+--------+--------+------------------+------------------+ CCA Prox  84      8                                                    +----------+--------+--------+--------+------------------+------------------+ CCA Distal60      12                                intimal thickening +----------+--------+--------+--------+------------------+------------------+ ICA Prox  78      14              calcific          Shadowing          +----------+--------+--------+--------+------------------+------------------+ ICA Distal62      11                                                   +----------+--------+--------+--------+------------------+------------------+ ECA       80      0               calcific                             +----------+--------+--------+--------+------------------+------------------+ +----------+--------+-------+----------------+-------------------+           PSV cm/sEDV cmsDescribe        Arm Pressure (mmHG) +----------+--------+-------+----------------+-------------------+ [id number]            Multiphasic, WNL                    +----------+--------+-------+----------------+-------------------+ +---------+--------+--+--------+-+---------+ VertebralPSV cm/s32EDV cm/s7Antegrade +---------+--------+--+--------+-+---------+  Left Carotid Findings: +----------+--------+--------+--------+------------------+------------------+           PSV cm/sEDV  cm/sStenosisPlaque DescriptionComments           +----------+--------+--------+--------+------------------+------------------+ CCA Prox  104     0                                 intimal thickening +----------+--------+--------+--------+------------------+------------------+ CCA  Distal61      10                                                   +----------+--------+--------+--------+------------------+------------------+ ICA Prox  54      10                                                   +----------+--------+--------+--------+------------------+------------------+ ICA Distal71      14                                                   +----------+--------+--------+--------+------------------+------------------+ ECA       77      0                                                    +----------+--------+--------+--------+------------------+------------------+ +----------+--------+--------+----------------+-------------------+           PSV cm/sEDV cm/sDescribe        Arm Pressure (mmHG) +----------+--------+--------+----------------+-------------------+ [id number]             Multiphasic, WNL                    +----------+--------+--------+----------------+-------------------+ +---------+--------+--+--------+--+---------+ VertebralPSV cm/s41EDV cm/s11Antegrade +---------+--------+--+--------+--+---------+   Summary: Right Carotid: The extracranial vessels were near-normal with only minimal wall                thickening or plaque. Left Carotid: The extracranial vessels were near-normal with only minimal wall               thickening or plaque. Vertebrals:  Bilateral vertebral arteries demonstrate antegrade flow. Subclavians: Normal flow hemodynamics were seen in bilateral subclavian              arteries. *See table(s) above for measurements and observations.     Preliminary    CT HEAD POST STROKE FOLLOWUP/TIMED/STAT READ Result Date:  08/11/2024 CLINICAL DATA:  Follow-up examination for stroke. EXAM: CT HEAD WITHOUT CONTRAST TECHNIQUE: Contiguous axial images were obtained from the base of the skull through the vertex without intravenous contrast. RADIATION DOSE REDUCTION: This exam was performed according to the departmental dose-optimization program which includes automated exposure control, adjustment of the mA and/or kV according to patient size and/or use of iterative reconstruction technique. COMPARISON:  CT from 08/10/2024. FINDINGS: Brain: Previously identified hemorrhage centered at the left basal ganglia again seen. Hemorrhages increased in size now measuring 3.2 x 3.6 x 2.9 cm (estimated volume 17 mL, previously 11 mL). Surrounding edema, mildly increased. Partial effacement of the left lateral ventricle without significant midline shift. No intraventricular extension. No other acute intracranial hemorrhage. No other acute large vessel territory infarct. No mass lesion. No hydrocephalus or extra-axial fluid collection. Vascular: No abnormal hyperdense vessel. Calcified atherosclerosis present at the skull base. Skull: Scalp soft tissues and calvarium demonstrate no new finding. Sinuses/Orbits: Globes and orbital soft tissues within normal limits. Paranasal sinuses and mastoid  air cells are largely clear. Other: None. IMPRESSION: 1. Interval increase in size of left basal ganglia hemorrhage, now measuring 3.2 x 3.6 x 2.9 cm (estimated volume 17 mL, previously 11 mL). Mild increase in surrounding edema without significant midline shift. 2. No other new acute intracranial abnormality. Electronically Signed   By: Morene Hoard M.D.   On: 08/11/2024 03:32   CT HEAD CODE STROKE WO CONTRAST Result Date: 08/10/2024 CLINICAL DATA:  Code stroke. Initial evaluation for acute neuro deficit, stroke suspected. EXAM: CT HEAD WITHOUT CONTRAST TECHNIQUE: Contiguous axial images were obtained from the base of the skull through the vertex  without intravenous contrast. RADIATION DOSE REDUCTION: This exam was performed according to the departmental dose-optimization program which includes automated exposure control, adjustment of the mA and/or kV according to patient size and/or use of iterative reconstruction technique. COMPARISON:  None Available. FINDINGS: Brain: Acute intraparenchymal hemorrhage centered at the left basal ganglia measures 3.0 x 2.8 x 2.6 cm (estimated volume 11 mL). Mild surrounding edema with partial effacement of the left lateral ventricle, but no significant midline shift. No intraventricular extension or other complication. No other acute intracranial hemorrhage. No other acute large vessel territory infarct. No mass lesion. No extra-axial fluid collection. No hydrocephalus. Vascular: No abnormal hyperdense vessel. Calcified atherosclerosis present at skull base. Skull: Scalp soft tissues and calvarium demonstrate no acute finding. Sinuses/Orbits: Globes orbital soft tissues grossly within normal limits. Mild chronic mucoperiosteal thickening present about the ethmoidal air cells and maxillary sinuses. No significant mastoid effusion. Other: None. ASPECTS [facility] Stroke Program Early CT Score) Acute ICH, does not apply. IMPRESSION: 3.0 x 2.8 x 2.6 cm (estimated volume 11 mL) acute intraparenchymal hemorrhage centered at the left basal ganglia. Mild surrounding edema with partial effacement of the left lateral ventricle, but no significant midline shift. No intraventricular extension or other complication. These results were communicated to Dr. Michaela at 7:08 pm on 08/10/2024 by text page via the [facility] messaging system. Electronically Signed   By: Morene Hoard M.D.   On: 08/10/2024 19:09    Vitals:   08/11/24 0815 08/11/24 0835 08/11/24 0845 08/11/24 1142  BP: 139/72 (!) 178/78 (!) 144/60 (!) 154/70  Pulse: (!) 54 72 73 68  Resp: 15 13 15 17   Temp:      TempSrc:      SpO2: 98% 95% 97% 98%  Weight:          PHYSICAL EXAM General:  Alert, well-nourished, well-developed elderly patient in no acute distress Psych:  Mood and affect appropriate for situation CV: Regular rate and rhythm on monitor Respiratory:  Regular, unlabored respirations on room air   NEURO:  Mental Status: Patient is able to state his name his age and the city he is not Speech/Language: speech is with severe dysarthria and impaired naming but intact repetition  Cranial Nerves:  II: PERRL.  Blinks to threat on the left but not on the right III, IV, VI: Left gaze preference but able to cross midline VII: Right facial droop VIII: hearing intact to voice. IX, X: Voice is very dysarthric XII: tongue is deviated to the right Motor: Moves left upper extremity and left lower extremity with antigravity strength, no movement of right upper extremity, able to follow commands with right lower extremity Tone: is normal and bulk is normal Sensation- Intact to light touch bilaterally.  Coordination: FTN intact on the left, unable to perform on the right Gait- deferred  Most Recent NIH  1a Level of Conscious.: 0  1b LOC Questions: 1 1c LOC Commands: 0 2 Best Gaze: 1 3 Visual: 2 4 Facial Palsy: 1 5a Motor Arm - left: 0 5b Motor Arm - Right: 4 6a Motor Leg - Left: 2 6b Motor Leg - Right: 3 7 Limb Ataxia: 0 8 Sensory: 0 9 Best Language: 1 10 Dysarthria: 2 11 Extinct. and Inatten.: 0 TOTAL: 17   ASSESSMENT/PLAN  Alec [name] is a 78 y.o. male with history of diabetes, hypertension, CAD, CHF, rheumatoid arthritis and COPD admitted for right-sided weakness and aphasia.  He was found to have a left subcortical ICH.    ICH: Left BG ICH, etiology: Likely hypertensive Code Stroke CT head 11 mm acute IPH in left basal ganglia with no IVH Follow-up CT 08/11/02/2023 interval increase in size of left basal ganglia hemorrhage now at 17 mL with mild increase in surrounding edema without midline shift Follow-up CT 11/12 at  1220 IPH within the left basal ganglia with mild surrounding edema and midline shift measuring 4 mm, stable from prior study MRI pending MRA pending Carotid ultrasound minimal thickening or plaque in bilateral carotid arteries 2D Echo EF 55-60%, moderate eccentric left ventricular hypertrophy  LDL pending HgbA1c pending UDS neg VTE prophylaxis -SCDs aspirin  81 mg daily prior to admission, now on No antithrombotic secondary to IPH Therapy recommendations:  Pending Disposition: Pending  Hypertension Home meds: Losartan 5 mg daily, hydralazine  100 mg 3 times daily, benica 5mg  and lasix  Unstable, requiring Cleviprex Resume hydralazine  50 tid Add amlodipine  10 BP goal < 160 Long term BP goal normotensive  Hyperlipidemia Home meds: Atorvastatin  40 mg daily LDL pending, goal < 70 Resume statin  Continue statin at discharge  Diabetes type II Controlled Home meds: Insulin  aspart via insulin  pump  HgbA1c pending, goal < 7.0 CBGs SSI Recommend close follow-up with PCP for better DM control  CKD 4 Cre 4.01->3.72 On TF BMP monitoring Hold off lasix , ARB  Dysphagia Patient has post-stroke dysphagia, SLP consulted NPO, did not pass swallow place cortrak today On TF  Other Stroke Risk Factors Advanced age Obesity, Body mass index is 33.8 kg/m., BMI >/= 30 associated with increased stroke risk, recommend weight loss, diet and exercise as appropriate  Coronary artery disease Congestive heart failure  Other Active Problems RA COPD Thrombocytopenia platelet 91->88 Mild leukopenia WBC 3.3  Hospital day # 1  Patient seen by NP with MD, MD to edit note as needed. Cortney E Everitt Clint Kill , MSN, AGACNP-BC [facility] See Amion for schedule and pager information 08/11/2024 12:59 PM   ATTENDING NOTE: I reviewed above note and agree with the assessment and plan. Pt was seen and examined.   Pt is lethargic but eyes open, severe dysarthria with mostly intangible  words, but able to say 10 for age and hosptial for place. Following all simple commands. Able to name 1/2 and repeat simple words in severely dysarthric voice. Left gaze preference but able to cross midline to right, tracking bilaterally to voice, not blinking to visual threat on the R but intermittent on the left, PERRL. R facial droop. Tongue protrusion to the right. RUE flaccid. RLE slight withdraw to pain. LUE and LLE 5/5. Sensation not cooperative, L FTN intact grossly, R banbinski positive, gait not tested anges wherever appropriate.   For detailed assessment and plan, please refer to above as I have made chWife and RN are at the bedside.   Ary Cummins, MD PhD Stroke Neurology 08/11/2024 5:08 PM  This patient is  critically ill due to ICH, hypertensive emergency, CKD4 and at significant risk of neurological worsening, death form hematoma expansion, brain herniation, cerebral edema and renal failure. This patient's care requires constant monitoring of vital signs, hemodynamics, respiratory and cardiac monitoring, review of multiple databases, neurological assessment, discussion with family, other specialists and medical decision making of high complexity. I spent 45 minutes of neurocritical care time in the care of this patient. I had long discussion with wife at bedside, updated pt current condition, treatment plan and potential prognosis, and answered all the questions. She expressed understanding and appreciation.       To contact Stroke Continuity provider, please refer to [email or web address]. After hours, contact General Neurology

## 2024-08-11 NOTE — H&P (Signed)
 Brief Nutrition Note  Consult received for enteral/tube feeding initiation and management.  Adult Enteral Nutrition Protocol initiated. Full assessment to follow.  Cortrak tube in place with tip gastric.     Admitting Dx: ICH (intracerebral hemorrhage) (HCC) [I61.9] Left-sided nontraumatic intracerebral hemorrhage, unspecified cerebral location (HCC) [I61.9]  Body mass index is 33.8 kg/m. Pt meets criteria for obesity class I based on current BMI.  Labs:  Recent Labs  Lab 08/10/24 1901 08/11/24 0926  NA 140  141 138  K 4.0  4.0 4.8  CL 111  112* 109  CO2 17* 18*  BUN 45*  43* 44*  CREATININE 4.01*  4.30* 3.72*  CALCIUM  8.6* 8.7*  GLUCOSE 95  92 160*   Annalea Alguire P., RD, LDN, CNSC See AMiON for contact information

## 2024-08-11 NOTE — Procedures (Signed)
 Cortrak  Person Inserting Tube:  Alec [name], RD Tube Type:  Cortrak - 43 inches Tube Size:  10 Tube Location:  Left nare Secured by: Bridle Initial Placement:  Gastric Technique Used to Measure Tube Placement:  Marking at nare/corner of mouth Cortrak Secured At:  68 cm Initial Placement Verification:  Cortrak device (Registered Dieticians Only)  Cortrak Tube Team Note:  Consult received to place a Cortrak feeding tube.   No x-ray is required. RN may begin using tube.   If the tube becomes dislodged please keep the tube and contact the Cortrak team at [email or web address] for replacement.  If after hours and replacement cannot be delayed, place a NG tube and confirm placement with an abdominal x-ray.    Alec Elihue, MS, RDN, LDN Clinical Dietitian I Please reach out via secure chat

## 2024-08-11 NOTE — Procedures (Addendum)
 Modified Barium Swallow Study  Patient Details  Name: Alec [name] MRN: [id number] Date of Birth: Apr 27, 1946  Today's Date: 08/11/2024  Modified Barium Swallow completed.  Full report located under Chart Review in the Imaging Section.  History of Present Illness Alec [name]) is a 78 y.o. male who presented 11/11 with right-sided weakness and aphasia. Head CT with large L hemorrage.   CT 11/12: Interval increase in size of left basal ganglia hemorrhage, now  measuring 3.2 x 3.6 x 2.9 cm (estimated volume 17 mL, previously 11  mL). Mild increase in surrounding edema without significant midline  shift.  Pt with hx of diabetes, hypertension, CAD, CHF, RA, COPD   Clinical Impression Patient had eyes open but was very lethargic and unable to follow any commands. Unable to achieve full view of pharynx, trachea, PES secondary to patient's positioning with shoulder obscuring view. Attempts to reposition were not successful. SLP administered one spoon sip of thin liquid barium while using hand to support patient's head in neutral position. Bolus remained in anterior portion of oral cavity with no movement observed of oral musculature. No swallow initiation observed. SLP used toothette swabs to remove barium from oral cavity. SLP recommending continue NPO and will follow for readiness for repeat MBS as today's MBS was not successful in adequately assessing his oropharyngeal swallow function.  Factors that may increase risk of adverse event in presence of aspiration Alec & Lianne 2021): Reduced cognitive function;Dependence for feeding and/or oral hygiene  Swallow Evaluation Recommendations Recommendations: NPO Medication Administration: Via alternative means Oral care recommendations: Oral care QID (4x/day);Staff/trained caregiver to provide oral care    Alec IVAR Blase, MA, CCC-SLP Speech Therapy

## 2024-08-11 NOTE — Evaluation (Addendum)
 Clinical/Bedside Swallow Evaluation Patient Details  Name: Alec [name] MRN: [id number] Date of Birth: [birth year]  Today's Date: 08/11/2024 Time: SLP Start Time (ACUTE ONLY): 0850 SLP Stop Time (ACUTE ONLY): 0909 SLP Time Calculation (min) (ACUTE ONLY): 19 min  Past Medical History:  Past Medical History:  Diagnosis Date   Anemia    Anxiety    Aortic insufficiency    Arthritis    Bilateral lower extremity edema    Chronic gout    06-19-2020  per pt last episode 6 months , great toe   CKD (chronic kidney disease), stage IV (HCC)    Coronary artery disease cardiologist--- dr hochrein   CABG 2019   Degenerative arthritis of shoulder region 08/2013   left   History of bladder cancer 07/2008   s/p  TURBT   History of cellulitis 11/2017   left upper arm   History of kidney stones    Hyperlipidemia    Hypertension    followed by pcp/ cardiology   IDDM (insulin  dependent diabetes mellitus)    endocrinologist--- dr von---  pt uses insulin  pump and dexcom  (06-19-2020 per pt fasting sugar-- 98--110)   Insulin  pump in place    Mitral regurgitation    OSA (obstructive sleep apnea)    Prostate cancer [facility]) urologist--- dr herrick/  oncologist--- manning   dx 11/ 2017  Gleason 3+3 active survillance;  bx 03-14-2020 Gleason 3+4   RA (rheumatoid arthritis) (HCC)    rhemotologist--- dr donnis---     S/P CABG x 3 07/15/2018   LIMA to LAD;  SVG to PDA;  SVG to RI   Thrombocytopenia    Past Surgical History:  Past Surgical History:  Procedure Laterality Date   CHOLECYSTECTOMY  08/29/2012   Procedure: LAPAROSCOPIC CHOLECYSTECTOMY WITH INTRAOPERATIVE CHOLANGIOGRAM;  Surgeon: Donnice POUR. Tsuei, MD;  Location: [facility];  Service: General;  Laterality: N/A;   CORONARY ARTERY BYPASS GRAFT N/A 07/20/2018   Procedure: CORONARY ARTERY BYPASS GRAFTING (CABG) times three using left internal mammary artery to the LAD, and using endoscopically harvested right saphenous vein to PDA and  intermedius.;  Surgeon: Army Dallas NOVAK, MD;  Location: [facility] OR;  Service: Open Heart Surgery;  Laterality: N/A;   LEFT HEART CATH AND CORONARY ANGIOGRAPHY N/A 07/15/2018   Procedure: LEFT HEART CATH AND CORONARY ANGIOGRAPHY;  Surgeon: Anner Alm ORN, MD;  Location: [facility] INVASIVE CV LAB;  Service: Cardiovascular;  Laterality: N/A;   RADIOACTIVE SEED IMPLANT N/A 06/21/2020   Procedure: RADIOACTIVE SEED IMPLANT/BRACHYTHERAPY IMPLANT;  Surgeon: Cam Morene ORN, MD;  Location: [facility];  Service: Urology;  Laterality: N/A;   SHOULDER ARTHROSCOPY W/ ROTATOR CUFF REPAIR Right 09/03/2013   SHOULDER ARTHROSCOPY WITH SUBACROMIAL DECOMPRESSION, ROTATOR CUFF REPAIR AND BICEP TENDON REPAIR Left 09/03/2013   Procedure: LEFT SHOULDER ARTHROSCOPY WITH EXTENSIVE DEBRIDMENT, DISTAL CLAVICULECTOMY, ROTATOR CUFF REPAIR AND SUBACROMIAL DECOMPRESSION PARTIAL ACRIOMIOPLASTY WITH CORACOACROMIAL RELEASE;  Surgeon: Evalene JONETTA Chancy, MD;  Location: [facility];  Service: Orthopedics;  Laterality: Left;   SPACE OAR INSTILLATION N/A 06/21/2020   Procedure: SPACE OAR INSTILLATION;  Surgeon: Cam Morene ORN, MD;  Location: [facility];  Service: Urology;  Laterality: N/A;   TEE WITHOUT CARDIOVERSION N/A 07/20/2018   Procedure: TRANSESOPHAGEAL ECHOCARDIOGRAM (TEE);  Surgeon: Army Dallas NOVAK, MD;  Location: [facility] OR;  Service: Open Heart Surgery;  Laterality: N/A;   TRANSURETHRAL RESECTION OF BLADDER TUMOR WITH MITOMYCIN -C  08/24/2008   @[facility]    HPI:  Alec STEELY [facility]) is a  78 y.o. male who presented 11/11 with right-sided weakness and aphasia. Head CT with large L hemorrage.   CT 11/12: Interval increase in size of left basal ganglia hemorrhage, now  measuring 3.2 x 3.6 x 2.9 cm (estimated volume 17 mL, previously 11  mL). Mild increase in surrounding edema without significant midline  shift.  Pt with hx of diabetes, hypertension, CAD, CHF, RA, COPD    Assessment / Plan  / Recommendation  Clinical Impression  Pt presents with clinical indicators of pharyngeal dysphagia in setting of LBG hemorrage.  RN providing oral care on SLP arrival.  Pt with good tolerance of ice chips and water by teaspoon.  There was anterior loss of thin liquid and nectar thick liquid by cup sip.  With thin liquid there was immediate wet, expulsive cough on 2 of 2 trials.  With nectar thick liquid by cup sip there was cough on first of 4 trials.  There were no clinical s/s of aspiration with puree, and pt exhibited adequate oral clearance of puree solids.  Recommend further assessment of pharyngeal swallow function via MBSS planned for later this date.    Recommend pt remain NPO pending results of study.   Pt may have priority oral meds crushed in puree, but holding medications until completion of MBS or administration via non oral route preferred. Pt may have ice chips and small amounts of water by teaspoon, in moderation, after good oral care, when fully awake/alert, with upright positioning and direct supervision.  Formal speech language assessment to follow, but pt demonstrated ability to follow commands.  His speech is moderately-severely dysarthric, but he did exhibit appropriate expressive language in known context and word and phrase level.   SLP Visit Diagnosis: Dysphagia, oropharyngeal phase (R13.12)    Aspiration Risk  Mild aspiration risk    Diet Recommendation NPO    Medication Administration: Via alternative means (priority oral meds may be given crushed with puree if needed)    Other  Recommendations Oral Care Recommendations: Oral care QID;Oral care prior to ice chip/H20     Assistance Recommended at Discharge  N/A  Functional Status Assessment Patient has had a recent decline in their functional status and demonstrates the ability to make significant improvements in function in a reasonable and predictable amount of time.  Frequency and Duration  (TBD)           Prognosis Prognosis for improved oropharyngeal function: Good      Swallow Study   General Date of Onset: 08/10/24 HPI: Alec SIKORSKI [facility]) is a 78 y.o. male who presented 11/11 with right-sided weakness and aphasia. Head CT with large L hemorrage.   CT 11/12: Interval increase in size of left basal ganglia hemorrhage, now  measuring 3.2 x 3.6 x 2.9 cm (estimated volume 17 mL, previously 11  mL). Mild increase in surrounding edema without significant midline  shift.  Pt with hx of diabetes, hypertension, CAD, CHF, RA, COPD Type of Study: Bedside Swallow Evaluation Previous Swallow Assessment: none Diet Prior to this Study: NPO Temperature Spikes Noted: No Respiratory Status: Room air History of Recent Intubation: No Behavior/Cognition: Alert;Cooperative;Requires cueing;Pleasant mood Oral Cavity Assessment: Within Functional Limits Oral Care Completed by SLP: Recent completion by staff Oral Cavity - Dentition: Adequate natural dentition Self-Feeding Abilities: Needs assist Patient Positioning: Upright in bed Baseline Vocal Quality: Wet Volitional Cough: Cognitively unable to elicit    Oral/Motor/Sensory Function Overall Oral Motor/Sensory Function: Moderate impairment Facial ROM: Reduced right Facial Symmetry: Abnormal symmetry right  Facial Strength: Reduced right Lingual ROM: Reduced right Lingual Symmetry: Abnormal symmetry right Lingual Strength: Reduced (unable) Velum: Within Functional Limits Mandible: Within Functional Limits   Ice Chips Ice chips: Within functional limits Presentation: Spoon   Thin Liquid Thin Liquid: Impaired Presentation: Cup;Spoon Oral Phase Functional Implications: Right anterior spillage Pharyngeal  Phase Impairments: Wet Vocal Quality;Throat Clearing - Immediate    Nectar Thick Nectar Thick Liquid: Impaired Presentation: Cup Pharyngeal Phase Impairments: Cough - Immediate   Honey Thick Honey Thick Liquid: Not tested   Puree Puree:  Within functional limits Presentation: Spoon   Solid     Solid: Not tested      [name], MA, CCC-SLP Acute Rehabilitation Services Office: [phone] 08/11/2024,10:09 AM

## 2024-08-12 ENCOUNTER — Inpatient Hospital Stay (HOSPITAL_COMMUNITY)

## 2024-08-12 DIAGNOSIS — R9082 White matter disease, unspecified: Secondary | ICD-10-CM | POA: Diagnosis not present

## 2024-08-12 DIAGNOSIS — I161 Hypertensive emergency: Secondary | ICD-10-CM | POA: Diagnosis not present

## 2024-08-12 DIAGNOSIS — I130 Hypertensive heart and chronic kidney disease with heart failure and stage 1 through stage 4 chronic kidney disease, or unspecified chronic kidney disease: Secondary | ICD-10-CM | POA: Diagnosis not present

## 2024-08-12 DIAGNOSIS — I610 Nontraumatic intracerebral hemorrhage in hemisphere, subcortical: Secondary | ICD-10-CM | POA: Diagnosis not present

## 2024-08-12 DIAGNOSIS — Z0389 Encounter for observation for other suspected diseases and conditions ruled out: Secondary | ICD-10-CM | POA: Diagnosis not present

## 2024-08-12 DIAGNOSIS — G936 Cerebral edema: Secondary | ICD-10-CM | POA: Diagnosis not present

## 2024-08-12 DIAGNOSIS — R29717 NIHSS score 17: Secondary | ICD-10-CM | POA: Diagnosis not present

## 2024-08-12 LAB — CBC
HCT: 34.5 % — ABNORMAL LOW (ref 39.0–52.0)
Hemoglobin: 11.7 g/dL — ABNORMAL LOW (ref 13.0–17.0)
MCH: 31.3 pg (ref 26.0–34.0)
MCHC: 33.9 g/dL (ref 30.0–36.0)
MCV: 92.2 fL (ref 80.0–100.0)
Platelets: 93 K/uL — ABNORMAL LOW (ref 150–400)
RBC: 3.74 MIL/uL — ABNORMAL LOW (ref 4.22–5.81)
RDW: 13.3 % (ref 11.5–15.5)
WBC: 4.9 K/uL (ref 4.0–10.5)
nRBC: 0 % (ref 0.0–0.2)

## 2024-08-12 LAB — PHOSPHORUS: Phosphorus: 3.4 mg/dL (ref 2.5–4.6)

## 2024-08-12 LAB — GLUCOSE, CAPILLARY
Glucose-Capillary: 161 mg/dL — ABNORMAL HIGH (ref 70–99)
Glucose-Capillary: 177 mg/dL — ABNORMAL HIGH (ref 70–99)
Glucose-Capillary: 191 mg/dL — ABNORMAL HIGH (ref 70–99)
Glucose-Capillary: 201 mg/dL — ABNORMAL HIGH (ref 70–99)
Glucose-Capillary: 208 mg/dL — ABNORMAL HIGH (ref 70–99)
Glucose-Capillary: 219 mg/dL — ABNORMAL HIGH (ref 70–99)

## 2024-08-12 LAB — BASIC METABOLIC PANEL WITH GFR
Anion gap: 10 (ref 5–15)
BUN: 49 mg/dL — ABNORMAL HIGH (ref 8–23)
CO2: 18 mmol/L — ABNORMAL LOW (ref 22–32)
Calcium: 8.7 mg/dL — ABNORMAL LOW (ref 8.9–10.3)
Chloride: 107 mmol/L (ref 98–111)
Creatinine, Ser: 3.54 mg/dL — ABNORMAL HIGH (ref 0.61–1.24)
GFR, Estimated: 17 mL/min — ABNORMAL LOW (ref >60)
Glucose, Bld: 182 mg/dL — ABNORMAL HIGH (ref 70–99)
Potassium: 4.6 mmol/L (ref 3.5–5.1)
Sodium: 135 mmol/L (ref 135–145)

## 2024-08-12 LAB — LIPID PANEL
Cholesterol: 99 mg/dL (ref 0–200)
HDL: 32 mg/dL — ABNORMAL LOW (ref >40)
LDL Cholesterol: 43 mg/dL (ref 0–99)
Total CHOL/HDL Ratio: 3.1 ratio
Triglycerides: 122 mg/dL (ref <150)
VLDL: 24 mg/dL (ref 0–40)

## 2024-08-12 LAB — HEMOGLOBIN A1C
Hgb A1c MFr Bld: 6 % — ABNORMAL HIGH (ref 4.8–5.6)
Mean Plasma Glucose: 125.5 mg/dL

## 2024-08-12 LAB — MAGNESIUM: Magnesium: 2.5 mg/dL — ABNORMAL HIGH (ref 1.7–2.4)

## 2024-08-12 MED ORDER — HEPARIN SODIUM (PORCINE) 5000 UNIT/ML IJ SOLN
5000.0000 [IU] | Freq: Three times a day (TID) | INTRAMUSCULAR | Status: DC
  Administered 2024-08-12 – 2024-09-04 (×69): 5000 [IU] via SUBCUTANEOUS
  Filled 2024-08-12 (×68): qty 1

## 2024-08-12 MED ORDER — HYDRALAZINE HCL 50 MG PO TABS
100.0000 mg | ORAL_TABLET | Freq: Three times a day (TID) | ORAL | Status: DC
  Administered 2024-08-12 – 2024-08-17 (×14): 100 mg
  Filled 2024-08-12 (×13): qty 2

## 2024-08-12 MED ORDER — GLUCERNA 1.5 CAL PO LIQD
1000.0000 mL | ORAL | Status: DC
  Administered 2024-08-12 – 2024-08-21 (×8): 1000 mL
  Filled 2024-08-12 (×18): qty 1000

## 2024-08-12 MED ORDER — LABETALOL HCL 5 MG/ML IV SOLN
10.0000 mg | INTRAVENOUS | Status: DC | PRN
  Administered 2024-08-12 – 2024-08-13 (×5): 10 mg via INTRAVENOUS
  Filled 2024-08-12 (×6): qty 4

## 2024-08-12 MED ORDER — HYDROMORPHONE HCL 1 MG/ML IJ SOLN
0.5000 mg | Freq: Once | INTRAMUSCULAR | Status: AC | PRN
  Administered 2024-08-12: 0.5 mg via INTRAVENOUS
  Filled 2024-08-12: qty 1

## 2024-08-12 MED ORDER — INSULIN GLARGINE-YFGN 100 UNIT/ML ~~LOC~~ SOLN
10.0000 [IU] | Freq: Every day | SUBCUTANEOUS | Status: DC
  Administered 2024-08-12: 10 [IU] via SUBCUTANEOUS
  Filled 2024-08-12 (×2): qty 0.1

## 2024-08-12 MED ORDER — HYDRALAZINE HCL 20 MG/ML IJ SOLN
10.0000 mg | INTRAMUSCULAR | Status: DC | PRN
  Administered 2024-08-12 – 2024-08-13 (×2): 10 mg via INTRAVENOUS
  Filled 2024-08-12 (×2): qty 1

## 2024-08-12 MED ORDER — HYDRALAZINE HCL 50 MG PO TABS
100.0000 mg | ORAL_TABLET | Freq: Three times a day (TID) | ORAL | Status: DC
  Administered 2024-08-12: 100 mg via ORAL
  Filled 2024-08-12 (×2): qty 2

## 2024-08-12 NOTE — Inpatient Diabetes Management (Signed)
 Inpatient Diabetes Program Recommendations  AACE/ADA: New Consensus Statement on Inpatient Glycemic Control (2015)  Target Ranges:  Prepandial:   less than 140 mg/dL      Peak postprandial:   less than 180 mg/dL (1-2 hours)      Critically ill patients:  140 - 180 mg/dL   Lab Results  Component Value Date   GLUCAP 177 (H) 08/12/2024   HGBA1C 6.0 (H) 08/12/2024    Review of Glycemic Control Diabetes history: DM 2 Outpatient Diabetes medications:  Insulin  pump- T slim Insulin  Pump setting:  Basal ( total 54.750 units) MN-2.0u/hour 2AM-    1.8  7AM-2.250 4PM-2.7 7PM-   3.0 10PM- 2.20   Bolus CHO Ratio (1unit:CHO) MN-1:8   No exact carb counting, he has been using 20-80 carb count based on meal size.   Correction/Sensitivity: MN-1:30 10PM-1:35   Target: 110   Current orders for Inpatient glycemic control:  Novolog  0-15 units q 4 hours Semglee  10 units daily Glucerna 1. 5 cal 55 ml/hr Inpatient Diabetes Program Recommendations:    Agree with the addition of basal insulin .  If CBG's>goal with tube feeds may consider adding Novolog  tube feed coverage 2-3 units q 4 hours (hold if feed held).   Thanks,  Randall Bullocks, RN, BC-ADM Inpatient Diabetes Coordinator Pager [phone]  (8a-5p)

## 2024-08-12 NOTE — Progress Notes (Signed)
 Speech Language Pathology Treatment: Dysphagia  Patient Details Name: Alec [name] MRN: [id number] DOB: [birth year] Today's Date: 08/12/2024 Time: 8995-8986 SLP Time Calculation (min) (ACUTE ONLY): 9 min  Assessment / Plan / Recommendation Clinical Impression  Pt seen for ongoing dysphagia management.  Pt lethargic today (sedative medications v cerebral edema).  Pt continues to tolerate thin liquid by spoon.  There was coughing with straw sips of liquid and some anterior loss.  With trials of puree, there was increased residue on R side.  Pt is not ready for repeat assessment at this time.  Will continue recommendations for ice chips and small amounts of thin liquid by spoon.  Pt unable to maintain alert state to complete language assessment today, and had increased difficulty following commands as compared to 11/12.  SLP will continue to follow for PO readiness and further language assessment.  Recommend pt remain NPO with alternate means of nutrition, hydration, and medication. Pt may have ice chips and small amounts of unthickened water by spoon, in moderation, after good oral care, when fully awake/alert, with upright positioning and 1:1 assistance.    HPI HPI: Alec [name] [name] is a 78 y.o. male who presented 11/11 with right-sided weakness and aphasia. Head CT with large L hemorrage.   CT 11/12: Interval increase in size of left basal ganglia hemorrhage, now  measuring 3.2 x 3.6 x 2.9 cm (estimated volume 17 mL, previously 11  mL). Mild increase in surrounding edema without significant midline  shift.  Pt with hx of diabetes, hypertension, CAD, CHF, RA, COPD      SLP Plan  Continue with current plan of care          Recommendations  Diet recommendations: NPO Medication Administration: Via alternative means                  Oral care QID;Oral care prior to ice chip/H20     Dysphagia, oropharyngeal phase (R13.12)     Continue with current plan of care      [name], MA, CCC-SLP Acute Rehabilitation Services Office: [phone] 08/12/2024, 11:02 AM

## 2024-08-12 NOTE — Evaluation (Signed)
 Occupational Therapy Evaluation Patient Details Name: Alec [name] MRN: [id number] DOB: 10-Feb-1946 Today's Date: 08/12/2024   History of Present Illness   78 y.o. male presents to [facility] hospital on 08/10/2024 with R weakness and aphasia. CT head with finding of large L basal ganglia hemorrhage. PMH includes DM, HTN, CAD, CHF, RA, COPD.     Clinical Impressions Alec [name] was evaluated s/p the above admission list. 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 Upon evaluation the pt was limited by R hemi weakness, R inattention, low LOA, impaired communication and cognition, RLE tightness, and elevated BP. Overall he will need significant assist fro all aspects of bed mobility, elevation was limited to bed in chair position due to his BP rising with any movement. Due to the deficits listed below the pt also needs max A for grooming tasks and total A for most other ADLs at bed level. Pt will benefit from continued acute OT services and intensive inpatient follow up therapy, >3 hours/day after discharge.   BP on arrival 155/62 BP with arousal and BUE movement 164/72        *RN made aware* BP with HOB to 45* 176/69 BP at the end of the session with HOB at 30* 172/62      If plan is discharge home, recommend the following:   Two people to help with walking and/or transfers;A lot of help with walking and/or transfers;A lot of help with bathing/dressing/bathroom;Two people to help with bathing/dressing/bathroom;Assistance with cooking/housework;Direct supervision/assist for medications management;Direct supervision/assist for financial management;Assist for transportation;Help with stairs or ramp for entrance     Functional Status Assessment   Patient has had a recent decline in their functional status and demonstrates the ability to make significant improvements in function in a reasonable and predictable amount of time.     Equipment Recommendations   None  recommended by OT (defer)     Recommendations for Other Services   Rehab consult     Precautions/Restrictions   Precautions Precautions: Fall;Other (comment) Recall of Precautions/Restrictions: Impaired Precaution/Restrictions Comments: SBP<160, cortrak Restrictions Weight Bearing Restrictions Per Provider Order: No     Mobility Bed Mobility Overal bed mobility: Needs Assistance             General bed mobility comments: unable to attempt due to elevated BP, bed positioning to partial chair position and BP continued to elevate. RN made aware.    Transfers                          Balance                                           ADL either performed or assessed with clinical judgement   ADL Overall ADL's : Needs assistance/impaired Eating/Feeding: NPO   Grooming: Maximal assistance;Bed level Grooming Details (indicate cue type and reason): pt was able to wash his face with min A however he would need max A for other bilateral grooming tasks Upper Body Bathing: Maximal assistance   Lower Body Bathing: Total assistance;+2 for physical assistance;+2 for safety/equipment   Upper Body Dressing : Total assistance   Lower Body Dressing: Total assistance;+2 for physical assistance;+2 for safety/equipment   Toilet Transfer: Total assistance;+2 for physical assistance;+2 for safety/equipment   Toileting- Clothing Manipulation and Hygiene: Total assistance;+2 for physical assistance;+2 for  safety/equipment       Functional mobility during ADLs: Total assistance;+2 for physical assistance;+2 for safety/equipment General ADL Comments: limited to bed level due to elevated BP     Vision   Vision Assessment?: Vision impaired- to be further tested in functional context (R inattention but tracked to R with maximal stimuli and non-distracting L environment)     Perception Perception: Not tested       Praxis Praxis: Not tested        Pertinent Vitals/Pain       Extremity/Trunk Assessment Upper Extremity Assessment Upper Extremity Assessment: RUE deficits/detail RUE Deficits / Details: flaccid. withdrew to noxious stim RUE Sensation: decreased light touch;decreased proprioception RUE Coordination: decreased fine motor;decreased gross motor   Lower Extremity Assessment Lower Extremity Assessment: Defer to PT evaluation   Cervical / Trunk Assessment Cervical / Trunk Assessment: Normal   Communication Communication Communication: Impaired Factors Affecting Communication: Difficulty expressing self;Reduced clarity of speech   Cognition Arousal: Obtunded, Lethargic Behavior During Therapy: Flat affect Cognition: Difficult to assess Difficult to assess due to: Level of arousal           OT - Cognition Comments: Pt followed some simple commands to visually track, and move his LUE. He nodded accurately to his name but did not nodd accurately to location.                 Following commands: Impaired Following commands impaired: Follows one step commands inconsistently     Cueing  General Comments   Cueing Techniques: Verbal cues;Gestural cues;Visual cues  elevated BP.   Exercises     Shoulder Instructions      Home Living Family/patient expects to be discharged to:: Private residence Living Arrangements: Spouse/significant other Available Help at Discharge: Family;Available 24 hours/day Type of Home: House Home Access: Stairs to enter [employer] of Steps: 2 Entrance Stairs-Rails: None Home Layout: One level     Bathroom Shower/Tub: Chief Strategy Officer: Handicapped height     Home Equipment: Cane - single point          Prior Functioning/Environment Prior Level of Function : Independent/Modified Independent               ADLs Comments: wife helps with med management    OT Problem List: Decreased strength;Decreased range of motion;Decreased  activity tolerance;Impaired balance (sitting and/or standing);Decreased cognition;Decreased coordination;Decreased safety awareness;Decreased knowledge of use of DME or AE;Decreased knowledge of precautions;Impaired sensation;Impaired UE functional use;Pain   OT Treatment/Interventions: Self-care/ADL training;Therapeutic exercise;Neuromuscular education;DME and/or AE instruction;Therapeutic activities;Balance training;Patient/family education      OT Goals(Current goals can be found in the care plan section)   Acute Rehab OT Goals Patient Stated Goal: unable to state OT Goal Formulation: With patient Time For Goal Achievement: 08/19/24 Potential to Achieve Goals: Fair ADL Goals Pt Will Perform Grooming: with min assist;sitting Pt Will Perform Upper Body Dressing: with min assist;sitting Pt Will Perform Lower Body Dressing: with max assist;sit to/from stand Pt Will Transfer to Toilet: with max assist;stand pivot transfer;bedside commode Additional ADL Goal #1: Pt will complete bed mobility with mod A as a precursor to ADLs   OT Frequency:  Min 2X/week    Co-evaluation PT/OT/SLP Co-Evaluation/Treatment: Yes Reason for Co-Treatment: Complexity of the patient's impairments (multi-system involvement);For patient/therapist safety;To address functional/ADL transfers   OT goals addressed during session: ADL's and self-care      AM-PAC OT 6 Clicks Daily Activity     Outcome Measure Help from another person  eating meals?: Total Help from another person taking care of personal grooming?: A Lot Help from another person toileting, which includes using toliet, bedpan, or urinal?: Total Help from another person bathing (including washing, rinsing, drying)?: Total Help from another person to put on and taking off regular upper body clothing?: Total Help from another person to put on and taking off regular lower body clothing?: Total 6 Click Score: 7   End of Session Nurse Communication:  Mobility status  Activity Tolerance: Patient tolerated treatment well Patient left: in bed;with call bell/phone within reach;with bed alarm set;with family/visitor present  OT Visit Diagnosis: Unsteadiness on feet (R26.81);Other abnormalities of gait and mobility (R26.89);Muscle weakness (generalized) (M62.81);Hemiplegia and hemiparesis Hemiplegia - Right/Left: Right Hemiplegia - dominant/non-dominant: Dominant Hemiplegia - caused by: Other Nontraumatic intracranial hemorrhage                Time: 1040-1059 OT Time Calculation (min): 19 min Charges:  OT General Charges $OT Visit: 1 Visit OT Evaluation $OT Eval Moderate Complexity: 1 Mod  Lucie Kendall, OTR/L Acute Rehabilitation Services Office [phone] Secure Chat Communication Preferred   Lucie JONETTA Kendall 08/12/2024, 11:22 AM

## 2024-08-12 NOTE — Progress Notes (Addendum)
 STROKE TEAM PROGRESS NOTE    SIGNIFICANT HOSPITAL EVENTS 11/11: Patient admitted with left subcortical ICH  INTERIM HISTORY/SUBJECTIVE  Cleviprex has been weaned off and after increase in p.o. blood pressure medications.  Will obtain head CT tomorrow morning to monitor for cerebral edema.  OBJECTIVE  CBC    Component Value Date/Time   WBC 4.9 08/12/2024 0753   RBC 3.74 (L) 08/12/2024 0753   HGB 11.7 (L) 08/12/2024 0753   HGB 12.1 (L) 10/26/2021 0902   HCT 34.5 (L) 08/12/2024 0753   HCT 33.7 (L) 10/26/2021 0902   PLT 93 (L) 08/12/2024 0753   PLT 128 (L) 10/26/2021 0902   MCV 92.2 08/12/2024 0753   MCV 92 10/26/2021 0902   MCH 31.3 08/12/2024 0753   MCHC 33.9 08/12/2024 0753   RDW 13.3 08/12/2024 0753   RDW 13.6 10/26/2021 0902   LYMPHSABS 0.8 08/10/2024 1901   LYMPHSABS 1.5 10/26/2021 0902   MONOABS 0.3 08/10/2024 1901   EOSABS 0.0 08/10/2024 1901   EOSABS 0.1 10/26/2021 0902   BASOSABS 0.0 08/10/2024 1901   BASOSABS 0.1 10/26/2021 0902    BMET    Component Value Date/Time   NA 135 08/12/2024 0612   NA 141 04/04/2022 1232   K 4.6 08/12/2024 0612   CL 107 08/12/2024 0612   CO2 18 (L) 08/12/2024 0612   GLUCOSE 182 (H) 08/12/2024 0612   BUN 49 (H) 08/12/2024 0612   BUN 63 (H) 04/04/2022 1232   CREATININE 3.54 (H) 08/12/2024 0612   CREATININE 3.61 (H) 01/15/2024 0840   CALCIUM  8.7 (L) 08/12/2024 0612   EGFR 16.0 04/30/2024 0917   EGFR 17 (L) 01/15/2024 0840   EGFR 17 (L) 04/04/2022 1232   GFRNONAA 17 (L) 08/12/2024 0612    IMAGING past 24 hours MR ANGIO HEAD WO CONTRAST Result Date: 08/12/2024 EXAM: MR Angiography Head without intravenous Contrast. 08/12/2024 05:59:00 AM TECHNIQUE: Magnetic resonance angiography images of the head without intravenous contrast. Multiplanar 2D and 3D reformatted images are provided for review. COMPARISON: CT of the head dated 08/11/2024. CLINICAL HISTORY: Stroke, hemorrhagic. FINDINGS: ANTERIOR CIRCULATION: No significant  stenosis of the internal carotid arteries. No significant stenosis of the anterior cerebral arteries. No significant stenosis of the middle cerebral arteries. No aneurysm. POSTERIOR CIRCULATION: No significant stenosis of the posterior cerebral arteries. No significant stenosis of the basilar artery. No significant stenosis of the vertebral arteries. No aneurysm. IMPRESSION: 1. No significant stenosis of the intracranial vasculature. Electronically signed by: Evalene Coho MD 08/12/2024 06:21 AM EST RP Workstation: [id number]   MR BRAIN WO CONTRAST Result Date: 08/12/2024 EXAM: MRI BRAIN WITHOUT CONTRAST 08/12/2024 05:59:00 AM TECHNIQUE: Multiplanar multisequence MRI of the head/brain was performed without the administration of intravenous contrast. COMPARISON: CT of the head dated 08/11/2024. CLINICAL HISTORY: Stroke, hemorrhagic. FINDINGS: BRAIN AND VENTRICLES: Intraparenchymal hemorrhage is again seen centered in the left basal ganglia, measuring approximately 4.4 x 3.5 x 3.8 cm. There is moderate surrounding vasogenic edema. There is also fluid-fluid level present within the collection from hematocrit effect. The left lateral ventricle is partially effaced and there is mild rightward shift to the midline structures by approximately 4 mm, similar to the prior exam. There is no convincing evidence of underlying mass. There is mild-to-moderate nonspecific cerebral white matter disease. No acute infarct. No hydrocephalus. The sella is unremarkable. Normal flow voids. ORBITS: The patient is status post bilateral lens replacement. No acute abnormality. SINUSES AND MASTOIDS: There is mild mucosal disease within the right maxillary sinus. No acute abnormality.  BONES AND SOFT TISSUES: Normal marrow signal. No acute soft tissue abnormality. IMPRESSION: 1. Left basal ganglia intraparenchymal hemorrhage measuring approximately 4.4 x 3.5 x 3.8 cm with moderate surrounding vasogenic edema and fluid-fluid level from  hematocrit effect, similar to the prior exam. 2. Mild rightward shift of midline structures by approximately 4 mm, similar to the prior exam. 3. Mild-to-moderate nonspecific cerebral white matter disease. Electronically signed by: Evalene Coho MD 08/12/2024 06:19 AM EST RP Workstation: [id number]    Vitals:   08/12/24 1100 08/12/24 1130 08/12/24 1200 08/12/24 1300  BP: (!) 157/68 (!) 154/66 (!) 151/56 (!) 158/62  Pulse: 67 68 72 73  Resp: 13 18 19 20   Temp:   98.9 F (37.2 C)   TempSrc:   Axillary   SpO2: 97% 96% 96% 96%  Weight:         PHYSICAL EXAM General:  Alert, well-nourished, well-developed elderly patient in no acute distress Psych:  Mood and affect appropriate for situation CV: Regular rate and rhythm on monitor Respiratory:  Regular, unlabored respirations on room air   NEURO:  Mental Status: Patient is able to state his name his age and the city he is not Speech/Language: speech is with severe dysarthria and impaired naming but intact repetition  Cranial Nerves:  II: PERRL.   III, IV, VI: Left gaze preference but able to cross midline VII: Right facial droop VIII: hearing intact to voice. IX, X: Voice is very dysarthric XII: tongue is midline Motor: Moves left upper extremity and left lower extremity with antigravity strength, flicker of movement right upper extremity to noxious, able to follow commands with right lower extremity Tone: is normal and bulk is normal Sensation- Intact to light touch bilaterally.  Coordination: FTN intact on the left, unable to perform on the right Gait- deferred  Most Recent NIH  1a Level of Conscious.: 0 1b LOC Questions: 1 1c LOC Commands: 0 2 Best Gaze: 1 3 Visual: 2 4 Facial Palsy: 1 5a Motor Arm - left: 0 5b Motor Arm - Right: 4 6a Motor Leg - Left: 2 6b Motor Leg - Right: 3 7 Limb Ataxia: 0 8 Sensory: 0 9 Best Language: 1 10 Dysarthria: 2 11 Extinct. and Inatten.: 0 TOTAL: 17   ASSESSMENT/PLAN  Mr.  EYDEN DOBIE is a 78 y.o. male with history of diabetes, hypertension, CAD, CHF, rheumatoid arthritis and COPD admitted for right-sided weakness and aphasia.  He was found to have a left subcortical ICH.    ICH: Left BG ICH, etiology: Likely hypertensive Code Stroke CT head 11 mm acute IPH in left basal ganglia with no IVH Follow-up CT 08/11/02/2023 interval increase in size of left basal ganglia hemorrhage now at 17 mL with mild increase in surrounding edema without midline shift Follow-up CT 11/12 at 1220 IPH within the left basal ganglia with mild surrounding edema and midline shift measuring 4 mm, stable from prior study MRI left basal ganglia IPH similar to prior exam with mild rightward shift of midline structures by 4 mm  MRA no LVO or significant stenosis Carotid ultrasound minimal thickening or plaque in bilateral carotid arteries 2D Echo EF 55-60%, moderate eccentric left ventricular hypertrophy  LDL 43 HgbA1c 6.0 UDS neg VTE prophylaxis -SCDs aspirin  81 mg daily prior to admission, now on No antithrombotic secondary to IPH Therapy recommendations:  CIR Disposition: Pending  Hypertension Home meds: Losartan 5 mg daily, hydralazine  100 mg 3 times daily, benica 5mg  and lasix  Unstable, requiring Cleviprex On hydralazine  50->100 tid  Add amlodipine  10 BP goal < 160 Long term BP goal normotensive  Hyperlipidemia Home meds: Atorvastatin  40 mg daily LDL 43, goal < 70 Resume statin  Continue statin at discharge  Diabetes type II Controlled Home meds: Insulin  aspart via insulin  pump  HgbA1c 6.0, goal < 7.0 Hyperglycemia CBGs SSI add insulin  glargine 10 units today Recommend close follow-up with PCP for better DM control  CKD 4 Cre 4.01->3.72-> 0.54 On TF BMP monitoring Hold off lasix , ARB  Dysphagia Patient has post-stroke dysphagia, SLP consulted NPO, did not pass swallow Core track placed yesterday On TF @ 55  Other Stroke Risk Factors Advanced age Obesity,  Body mass index is 34.3 kg/m., BMI >/= 30 associated with increased stroke risk, recommend weight loss, diet and exercise as appropriate  Coronary artery disease Congestive heart failure  Other Active Problems RA COPD Thrombocytopenia platelet 91->88->93 Mild leukopenia WBC 3.3-4.9  Hospital day # 2  Patient seen by NP with MD, MD to edit note as needed. Cortney E Everitt Clint Kill , MSN, AGACNP-BC [facility] See Amion for schedule and pager information 08/12/2024 2:35 PM  ATTENDING NOTE: I reviewed above note and agree with the assessment and plan. Pt was seen and examined.   Wife and RN are at the bedside. Pt is sleeping after dilaudid  for MRI but eyes open on voice, severe dysarthria with intangible words on orientation questions. Did not follow simple commands today. Not sure if he can name or repeat due to intangible words. Left gaze preference but barely cross midline, not blinking to visual threat on the R but intermittent on the left, PERRL. R facial droop. Tongue protrusion to the right. RUE mild extension on pain stimulation. RLE mild withdraw to pain. LUE and LLE at least 4/5. Sensation not cooperative, L FTN not cooperative today, R banbinski positive, gait not tested   For detailed assessment and plan, please refer to above as I have made changes wherever appropriate.   Ary Cummins, MD PhD Stroke Neurology 08/12/2024 7:01 PM  This patient is critically ill due to ICH, hypertensive emergency, CKD4 and at significant risk of neurological worsening, death form hematoma expansion, brain herniation, cerebral edema and renal failure. This patient's care requires constant monitoring of vital signs, hemodynamics, respiratory and cardiac monitoring, review of multiple databases, neurological assessment, discussion with family, other specialists and medical decision making of high complexity. I spent 40 minutes of neurocritical care time in the care of this patient. I had long  discussion with wife at bedside, updated pt current condition, treatment plan and potential prognosis, and answered all the questions. She expressed understanding and appreciation.     To contact Stroke Continuity provider, please refer to [email or web address]. After hours, contact General Neurology

## 2024-08-12 NOTE — Progress Notes (Addendum)
 Initial Nutrition Assessment  DOCUMENTATION CODES:   Obesity unspecified  INTERVENTION:  Change duotube feeding to Glucerna 1.5 in light of hyperglycemia. Initiate at 30 mL/hr and increase to goal rate of 55 mL/hr as tolerated after 4 hours. Continue 1 packet ProSource TF20 daily via duotube. This enteral nutrition regimen provides 2060 Kcals, 129 g protein, 176 g carbohydrates, and 1002 mL free water daily, which meets 100% of the patient's estimated nutrition needs. SLP swallow re-eval when appropriate.   NUTRITION DIAGNOSIS:   Swallowing difficulty (failed swallow eval) related to acute illness (ICH) as evidenced by NPO status (necessitating enteral nutrition support).   GOAL:   Patient will meet greater than or equal to 90% of their needs   MONITOR:   TF tolerance, Diet advancement, Labs, Weight trends, I & O's  REASON FOR ASSESSMENT:   Consult Enteral/tube feeding initiation and management  ASSESSMENT:   Patient presented with R-sided weakness and aphasia and was found to have large subcortical hemorrhage. PMH significant for DM2, CHF, HTN, CAD s/p CABG 2019, dyslipidemia, CKD4, COPD, prostate and bladder cancer.  11/11 admitted 11/12 failed SLP swallow eval 2/2 lethargy and inability to follow commands, Cortrak placed, TF Jevity 1.5 started at 40 mL/hr  The patient was asleep during visit. His wife Lonell was at bedside and tells me the patient's UBW fluctuates around the 200 lbs range. He had been eating well prior to admission. He was in his usual state of health until he came home and fainted. She comments that he does not typically eat healthy.  Typical day's intake: Breakfast - biscuit and coffee Lunch - pizza or subs or sandwiches Dinner - wife cooks a set designer with vegetables  Scheduled Meds:  allopurinol   50 mg Per Tube Daily   amLODipine   10 mg Per Tube Daily   atorvastatin   40 mg Per Tube Daily   Chlorhexidine  Gluconate Cloth  6 each Topical Q0600    feeding supplement (PROSource TF20)  60 mL Per Tube Daily   hydrALAZINE   50 mg Per Tube BID   And   hydrALAZINE   100 mg Per Tube QHS   insulin  aspart  0-15 Units Subcutaneous Q4H   latanoprost   1 drop Both Eyes QHS   pantoprazole  (PROTONIX ) IV  40 mg Intravenous QHS   senna-docusate  1 tablet Per Tube BID   sertraline   50 mg Per Tube Daily   Continuous Infusions:  clevidipine 6 mg/hr (08/12/24 0700)   feeding supplement (JEVITY 1.5 CAL/FIBER) 40 mL/hr at 08/12/24 0700    Diet Order             Diet NPO time specified  Diet effective now                  Meal Intake: N/A  Labs:     Latest Ref Rng & Units 08/12/2024    6:12 AM 08/11/2024    9:26 AM 08/10/2024    7:01 PM  CMP  Glucose 70 - 99 mg/dL 817  839  95    92   BUN 8 - 23 mg/dL 49  44  45    43   Creatinine 0.61 - 1.24 mg/dL 6.45  6.27  5.98    5.69   Sodium 135 - 145 mmol/L 135  138  140    141   Potassium 3.5 - 5.1 mmol/L 4.6  4.8  4.0    4.0   Chloride 98 - 111 mmol/L 107  109  111  112   CO2 22 - 32 mmol/L 18  18  17    Calcium  8.9 - 10.3 mg/dL 8.7  8.7  8.6   Total Protein 6.5 - 8.1 g/dL   6.6   Total Bilirubin 0.0 - 1.2 mg/dL   0.7   Alkaline Phos 38 - 126 U/L   65   AST 15 - 41 U/L   27   ALT 0 - 44 U/L   21       I/O: -1 L since admit  NUTRITION - FOCUSED PHYSICAL EXAM:  Flowsheet Row Most Recent Value  Orbital Region No depletion  Upper Arm Region No depletion  Thoracic and Lumbar Region No depletion  Buccal Region No depletion  Temple Region No depletion  Clavicle Bone Region No depletion  Clavicle and Acromion Bone Region No depletion  Scapular Bone Region No depletion  Dorsal Hand No depletion  Patellar Region No depletion  Anterior Thigh Region Mild depletion  Posterior Calf Region Mild depletion  Edema (RD Assessment) None  Hair Reviewed  Eyes Reviewed  Mouth Reviewed  Skin Reviewed  Nails Reviewed    EDUCATION NEEDS:   Not appropriate for education at this  time  Skin:  Skin Assessment: Reviewed RN Assessment  Last BM:  PTA  Height:   Ht Readings from Last 1 Encounters:  07/27/24 5' 6 (1.676 m)    Weight:    Weight Change: weight gain noted Usual Body Weight: 200 lbs with fluctuations per patient's wife  Edema: none  Ideal Body Weight:  65 kg   BMI:  Body mass index is 34.3 kg/m.  Estimated Nutritional Needs:  Kcal:  1900-2100 Protein:  115-140 Fluid:  >1900 or per Neuro    Leverne [name], MS, RDN, LDN [place]. [facility] See AMION for contact information

## 2024-08-12 NOTE — Plan of Care (Signed)
  Problem: Education: Goal: Knowledge of disease or condition will improve Outcome: Progressing   Problem: Intracerebral Hemorrhage Tissue Perfusion: Goal: Complications of Intracerebral Hemorrhage will be minimized Outcome: Progressing   Problem: Self-Care: Goal: Ability to participate in self-care as condition permits will improve Outcome: Progressing   Problem: Nutrition: Goal: Risk of aspiration will decrease Outcome: Progressing Goal: Dietary intake will improve Outcome: Progressing   Problem: Fluid Volume: Goal: Ability to maintain a balanced intake and output will improve Outcome: Progressing   Problem: Metabolic: Goal: Ability to maintain appropriate glucose levels will improve Outcome: Progressing   Problem: Tissue Perfusion: Goal: Adequacy of tissue perfusion will improve Outcome: Progressing   Problem: Clinical Measurements: Goal: Ability to maintain clinical measurements within normal limits will improve Outcome: Progressing Goal: Will remain free from infection Outcome: Progressing Goal: Respiratory complications will improve Outcome: Progressing Goal: Cardiovascular complication will be avoided Outcome: Progressing   Problem: Activity: Goal: Risk for activity intolerance will decrease Outcome: Progressing   Problem: Coping: Goal: Will verbalize positive feelings about self Outcome: Not Progressing Goal: Will identify appropriate support needs Outcome: Not Progressing   Problem: Self-Care: Goal: Ability to communicate needs accurately will improve Outcome: Not Progressing

## 2024-08-12 NOTE — Evaluation (Signed)
 Physical Therapy Evaluation Patient Details Name: Alec [name] MRN: [id number] DOB: Sep 06, 1946 Today's Date: 08/12/2024  History of Present Illness  78 y.o. male presents to [facility] hospital on 08/10/2024 with R weakness and aphasia. CT head with finding of large L basal ganglia hemorrhage. PMH includes DM, HTN, CAD, CHF, RA, COPD.  Clinical Impression  Pt admitted secondary to problem above with deficits below. PTA patient was living with wife in one level home with 2 steps to enter without rail. He was independent with mobility. Pt currently was limited by Rt hemiparesis, lethargy, and elevated BP >160 with minimal activity (RN made aware). Anticipate patient will benefit from PT to address problems listed below. Will continue to follow acutely to maximize functional mobility, independence, and safety. Patient will benefit from continued inpatient follow up therapy, >3 hours/day as activity tolerance improves.          If plan is discharge home, recommend the following: Two people to help with walking and/or transfers;Two people to help with bathing/dressing/bathroom;Assistance with feeding;Direct supervision/assist for medications management;Direct supervision/assist for financial management;Assist for transportation;Help with stairs or ramp for entrance;Supervision due to cognitive status   Can travel by private vehicle        Equipment Recommendations Wheelchair (measurements PT);Wheelchair cushion (measurements PT);Hospital bed  Recommendations for Other Services  Rehab consult    Functional Status Assessment Patient has had a recent decline in their functional status and demonstrates the ability to make significant improvements in function in a reasonable and predictable amount of time.     Precautions / Restrictions Precautions Precautions: Fall;Other (comment) Recall of Precautions/Restrictions: Impaired Precaution/Restrictions Comments: SBP<160, cortrak      Mobility  Bed  Mobility Overal bed mobility: Needs Assistance             General bed mobility comments: unable to attempt due to elevated BP, bed positioning to partial chair position and BP continued to elevate. RN made aware.    Transfers                        Ambulation/Gait                  Stairs            Wheelchair Mobility     Tilt Bed    Modified Rankin (Stroke Patients Only) Modified Rankin (Stroke Patients Only) Pre-Morbid Rankin Score: No symptoms Modified Rankin: Severe disability     Balance Overall balance assessment:  (clinical judgement significantly decr)                                           Pertinent Vitals/Pain Pain Assessment Facial Expression: Relaxed, neutral Body Movements: Absence of movements Muscle Tension: Relaxed Compliance with ventilator (intubated pts.): N/A Vocalization (extubated pts.): Talking in normal tone or no sound CPOT Total: 0    Home Living Family/patient expects to be discharged to:: Private residence Living Arrangements: Spouse/significant other Available Help at Discharge: Family;Available 24 hours/day Type of Home: House Home Access: Stairs to enter Entrance Stairs-Rails: None Entrance Stairs-Number of Steps: 2   Home Layout: One level Home Equipment: Cane - single point      Prior Function Prior Level of Function : Independent/Modified Independent               ADLs Comments: wife helps with med management  Extremity/Trunk Assessment       Lower Extremity Assessment Lower Extremity Assessment: Defer to PT evaluation RLE Deficits / Details: resting in hip abd/ER/Flexion with foot in PF; noted incr tone with PROM; able to achieve neutral position all joints; will order PRAFO to preserve ROM    Cervical / Trunk Assessment Cervical / Trunk Assessment: Normal  Communication   Communication Communication: Impaired Factors Affecting Communication: Difficulty  expressing self;Reduced clarity of speech    Cognition Arousal: Obtunded Behavior During Therapy: Flat affect                             Following commands: Impaired Following commands impaired: Follows one step commands inconsistently, Follows one step commands with increased time     Cueing Cueing Techniques: Verbal cues, Gestural cues, Tactile cues, Visual cues     General Comments General comments (skin integrity, edema, etc.): elevated BP.    Exercises     Assessment/Plan    PT Assessment Patient needs continued PT services  PT Problem List Decreased strength;Decreased range of motion;Decreased activity tolerance;Decreased balance;Decreased mobility;Decreased knowledge of use of DME;Cardiopulmonary status limiting activity;Impaired tone       PT Treatment Interventions DME instruction;Gait training;Stair training;Functional mobility training;Therapeutic activities;Therapeutic exercise;Balance training;Neuromuscular re-education;Patient/family education;Wheelchair mobility training    PT Goals (Current goals can be found in the Care Plan section)  Acute Rehab PT Goals Patient Stated Goal: pt unable to state PT Goal Formulation: With patient/family Time For Goal Achievement: 08/26/24 Potential to Achieve Goals: Good    Frequency Min 3X/week     Co-evaluation PT/OT/SLP Co-Evaluation/Treatment: Yes Reason for Co-Treatment: Complexity of the patient's impairments (multi-system involvement);For patient/therapist safety;To address functional/ADL transfers PT goals addressed during session: Strengthening/ROM OT goals addressed during session: ADL's and self-care       AM-PAC PT 6 Clicks Mobility  Outcome Measure Help needed turning from your back to your side while in a flat bed without using bedrails?: Total Help needed moving from lying on your back to sitting on the side of a flat bed without using bedrails?: Total Help needed moving to and from a bed  to a chair (including a wheelchair)?: Total Help needed standing up from a chair using your arms (e.g., wheelchair or bedside chair)?: Total Help needed to walk in hospital room?: Total Help needed climbing 3-5 steps with a railing? : Total 6 Click Score: 6    End of Session   Activity Tolerance: Treatment limited secondary to medical complications (Comment) (elevated BP) Patient left: in bed;with call bell/phone within reach;with family/visitor present Nurse Communication: Other (comment) (elevated BP limited mobility) PT Visit Diagnosis: Hemiplegia and hemiparesis Hemiplegia - Right/Left: Right Hemiplegia - dominant/non-dominant: Dominant Hemiplegia - caused by: Nontraumatic intracerebral hemorrhage    Time: 1040-1059 PT Time Calculation (min) (ACUTE ONLY): 19 min   Charges:   PT Evaluation $PT Eval Low Complexity: 1 Low   PT General Charges $$ ACUTE PT VISIT: 1 Visit          Macario RAMAN, PT Acute Rehabilitation Services  Office [phone]   Macario SHAUNNA Soja 08/12/2024, 11:37 AM

## 2024-08-12 NOTE — Progress Notes (Signed)
  Inpatient Rehab Admissions Coordinator :  Per therapy recommendations patient was screened for CIR candidacy by Heron Leavell RN MSN. Patient is not yet at a level to tolerate the intensity required to pursue a CIR admit . Bed level assessments at this time. The CIR admissions team will follow and monitor for progress and place a Rehab Consult order if felt to be appropriate. Please contact me with any questions.  Heron Leavell RN MSN Admissions Coordinator [phone]

## 2024-08-12 NOTE — Progress Notes (Signed)
 Orthopedic Tech Progress Note Patient Details:  AYDDEN CUMPIAN [birth year] [id number]  Ortho Devices Type of Ortho Device: Prafo boot/shoe Ortho Device/Splint Location: LLE Ortho Device/Splint Interventions: Ordered, Application, Adjustment   Post Interventions Patient Tolerated: Well  Thersia FALCON Candyce Gambino 08/12/2024, 12:30 PM

## 2024-08-12 NOTE — Progress Notes (Signed)
 PT Cancellation Note  Patient Details Name: Alec [name] MRN: [id number] DOB: Aug 24, 1946   Cancelled Treatment:    Reason Eval/Treat Not Completed: Active bedrest order  Will continue to monitor for appropriateness to proceed with PT evaluation.    Macario RAMAN, PT Acute Rehabilitation Services  Office [phone]  Macario SHAUNNA Soja 08/12/2024, 7:58 AM

## 2024-08-13 ENCOUNTER — Inpatient Hospital Stay (HOSPITAL_COMMUNITY)

## 2024-08-13 ENCOUNTER — Ambulatory Visit: Admitting: Endocrinology

## 2024-08-13 DIAGNOSIS — R29717 NIHSS score 17: Secondary | ICD-10-CM | POA: Diagnosis not present

## 2024-08-13 DIAGNOSIS — I610 Nontraumatic intracerebral hemorrhage in hemisphere, subcortical: Secondary | ICD-10-CM | POA: Diagnosis not present

## 2024-08-13 DIAGNOSIS — I130 Hypertensive heart and chronic kidney disease with heart failure and stage 1 through stage 4 chronic kidney disease, or unspecified chronic kidney disease: Secondary | ICD-10-CM | POA: Diagnosis not present

## 2024-08-13 DIAGNOSIS — I161 Hypertensive emergency: Secondary | ICD-10-CM | POA: Diagnosis not present

## 2024-08-13 LAB — CBC
HCT: 34 % — ABNORMAL LOW (ref 39.0–52.0)
Hemoglobin: 11.2 g/dL — ABNORMAL LOW (ref 13.0–17.0)
MCH: 31.4 pg (ref 26.0–34.0)
MCHC: 32.9 g/dL (ref 30.0–36.0)
MCV: 95.2 fL (ref 80.0–100.0)
Platelets: 81 K/uL — ABNORMAL LOW (ref 150–400)
RBC: 3.57 MIL/uL — ABNORMAL LOW (ref 4.22–5.81)
RDW: 13.5 % (ref 11.5–15.5)
WBC: 3.8 K/uL — ABNORMAL LOW (ref 4.0–10.5)
nRBC: 0 % (ref 0.0–0.2)

## 2024-08-13 LAB — BASIC METABOLIC PANEL WITH GFR
Anion gap: 12 (ref 5–15)
BUN: 56 mg/dL — ABNORMAL HIGH (ref 8–23)
CO2: 18 mmol/L — ABNORMAL LOW (ref 22–32)
Calcium: 9 mg/dL (ref 8.9–10.3)
Chloride: 110 mmol/L (ref 98–111)
Creatinine, Ser: 3.38 mg/dL — ABNORMAL HIGH (ref 0.61–1.24)
GFR, Estimated: 18 mL/min — ABNORMAL LOW (ref >60)
Glucose, Bld: 172 mg/dL — ABNORMAL HIGH (ref 70–99)
Potassium: 4.5 mmol/L (ref 3.5–5.1)
Sodium: 140 mmol/L (ref 135–145)

## 2024-08-13 LAB — MAGNESIUM: Magnesium: 2.4 mg/dL (ref 1.7–2.4)

## 2024-08-13 LAB — GLUCOSE, CAPILLARY
Glucose-Capillary: 170 mg/dL — ABNORMAL HIGH (ref 70–99)
Glucose-Capillary: 200 mg/dL — ABNORMAL HIGH (ref 70–99)
Glucose-Capillary: 201 mg/dL — ABNORMAL HIGH (ref 70–99)
Glucose-Capillary: 200 mg/dL — ABNORMAL HIGH (ref 70–99)
Glucose-Capillary: 211 mg/dL — ABNORMAL HIGH (ref 70–99)
Glucose-Capillary: 193 mg/dL — ABNORMAL HIGH (ref 70–99)

## 2024-08-13 LAB — PHOSPHORUS: Phosphorus: 3.7 mg/dL (ref 2.5–4.6)

## 2024-08-13 MED ORDER — CARVEDILOL 3.125 MG PO TABS: 3.1250 mg | ORAL_TABLET | Freq: Two times a day (BID) | ORAL | Status: DC

## 2024-08-13 MED ORDER — ZIPRASIDONE MESYLATE 20 MG IM SOLR
20.0000 mg | Freq: Once | INTRAMUSCULAR | Status: DC
  Filled 2024-08-13: qty 20

## 2024-08-13 MED ORDER — INSULIN GLARGINE-YFGN 100 UNIT/ML ~~LOC~~ SOLN
15.0000 [IU] | Freq: Every day | SUBCUTANEOUS | Status: DC
  Administered 2024-08-13 – 2024-08-14 (×2): 15 [IU] via SUBCUTANEOUS
  Filled 2024-08-13 (×2): qty 0.15

## 2024-08-13 MED ORDER — LABETALOL HCL 5 MG/ML IV SOLN
20.0000 mg | INTRAVENOUS | Status: DC | PRN
  Administered 2024-08-13 – 2024-08-15 (×5): 20 mg via INTRAVENOUS
  Filled 2024-08-13 (×4): qty 4

## 2024-08-13 MED ORDER — CARVEDILOL 6.25 MG PO TABS
6.2500 mg | ORAL_TABLET | Freq: Two times a day (BID) | ORAL | Status: DC
  Administered 2024-08-13 – 2024-08-14 (×4): 6.25 mg
  Filled 2024-08-13 (×4): qty 2

## 2024-08-13 MED ORDER — HYDRALAZINE HCL 20 MG/ML IJ SOLN
20.0000 mg | INTRAMUSCULAR | Status: DC | PRN
  Administered 2024-08-13 – 2024-08-27 (×11): 20 mg via INTRAVENOUS
  Filled 2024-08-13 (×13): qty 1

## 2024-08-13 NOTE — Evaluation (Signed)
 Speech Language Pathology Evaluation Patient Details Name: Alec [name] MRN: [id number] DOB: Feb 28, 1946 Today's Date: 08/13/2024 Time: 9175-9157 SLP Time Calculation (min) (ACUTE ONLY): 18 min  Problem List:  Patient Active Problem List   Diagnosis Date Noted   ICH (intracerebral hemorrhage) (HCC) 08/10/2024   Paronychia of great toe of right foot 07/25/2023   Type 2 diabetes mellitus with diabetic neuropathy, with long-term current use of insulin  (HCC) 07/19/2023   COPD (chronic obstructive pulmonary disease) (HCC) 03/14/2023   Gout 03/14/2023   OSA (obstructive sleep apnea) 03/14/2023   Depression 03/14/2023   BPH (benign prostatic hyperplasia) 03/14/2023   Acute on chronic diastolic (congestive) heart failure (HCC) 03/13/2023   CKD (chronic kidney disease), stage IV (HCC) 04/03/2022   AKI (acute kidney injury) 03/28/2022   Snoring 07/18/2020   Excessive sleepiness 07/18/2020   Witnessed episode of apnea 07/18/2020   Laryngopharyngeal reflux (LPR) 07/04/2020   Lesion of nasal septum 07/04/2020   Leg swelling 04/30/2020   Bradycardia 04/30/2020   Malignant neoplasm of prostate (HCC) 04/18/2020   Educated about COVID-19 virus infection 10/26/2019   Rheumatoid arthritis of multiple sites with negative rheumatoid factor (HCC) 09/06/2019   Shortness of breath 11/10/2018   Cough 11/10/2018   Wheezing 11/10/2018   Coronary artery disease involving native coronary artery of native heart without angina pectoris 11/10/2018   Dyslipidemia 11/10/2018   CRI (chronic renal insufficiency), stage 3 (moderate) 08/06/2018   S/P CABG x 3 07/20/2018   Angina, class II 07/14/2018   Abnormal stress electrocardiography using treadmill 07/14/2018   Cellulitis of left arm 12/10/2017   Cellulitis 12/09/2017   Diabetic nephropathy associated with type 2 diabetes mellitus (HCC) 12/14/2014   Essential hypertension 12/14/2014   Type 2 diabetes mellitus with chronic kidney disease, with long-term  current use of insulin  (HCC) 08/29/2012   Dyslipidemia, goal LDL below 70 08/29/2012   Abdominal pain 08/29/2012   Past Medical History:  Past Medical History:  Diagnosis Date   Anemia    Anxiety    Aortic insufficiency    Arthritis    Bilateral lower extremity edema    Chronic gout    06-19-2020  per pt last episode 6 months , great toe   CKD (chronic kidney disease), stage IV (HCC)    Coronary artery disease cardiologist--- dr hochrein   CABG 2019   Degenerative arthritis of shoulder region 08/2013   left   History of bladder cancer 07/2008   s/p  TURBT   History of cellulitis 11/2017   left upper arm   History of kidney stones    Hyperlipidemia    Hypertension    followed by pcp/ cardiology   IDDM (insulin  dependent diabetes mellitus)    endocrinologist--- dr von---  pt uses insulin  pump and dexcom  (06-19-2020 per pt fasting sugar-- 98--110)   Insulin  pump in place    Mitral regurgitation    OSA (obstructive sleep apnea)    Prostate cancer [facility]) urologist--- dr herrick/  oncologist--- manning   dx 11/ 2017  Gleason 3+3 active survillance;  bx 03-14-2020 Gleason 3+4   RA (rheumatoid arthritis) (HCC)    rhemotologist--- dr donnis---     S/P CABG x 3 07/15/2018   LIMA to LAD;  SVG to PDA;  SVG to RI   Thrombocytopenia    Past Surgical History:  Past Surgical History:  Procedure Laterality Date   CHOLECYSTECTOMY  08/29/2012   Procedure: LAPAROSCOPIC CHOLECYSTECTOMY WITH INTRAOPERATIVE CHOLANGIOGRAM;  Surgeon: Donnice POUR. Belinda, MD;  Location:  [facility];  Service: General;  Laterality: N/A;   CORONARY ARTERY BYPASS GRAFT N/A 07/20/2018   Procedure: CORONARY ARTERY BYPASS GRAFTING (CABG) times three using left internal mammary artery to the LAD, and using endoscopically harvested right saphenous vein to PDA and intermedius.;  Surgeon: Army Dallas NOVAK, MD;  Location: [facility] OR;  Service: Open Heart Surgery;  Laterality: N/A;   LEFT HEART CATH AND CORONARY ANGIOGRAPHY  N/A 07/15/2018   Procedure: LEFT HEART CATH AND CORONARY ANGIOGRAPHY;  Surgeon: Anner Alm ORN, MD;  Location: [facility] INVASIVE CV LAB;  Service: Cardiovascular;  Laterality: N/A;   RADIOACTIVE SEED IMPLANT N/A 06/21/2020   Procedure: RADIOACTIVE SEED IMPLANT/BRACHYTHERAPY IMPLANT;  Surgeon: Cam Morene ORN, MD;  Location: [facility];  Service: Urology;  Laterality: N/A;   SHOULDER ARTHROSCOPY W/ ROTATOR CUFF REPAIR Right 09/03/2013   SHOULDER ARTHROSCOPY WITH SUBACROMIAL DECOMPRESSION, ROTATOR CUFF REPAIR AND BICEP TENDON REPAIR Left 09/03/2013   Procedure: LEFT SHOULDER ARTHROSCOPY WITH EXTENSIVE DEBRIDMENT, DISTAL CLAVICULECTOMY, ROTATOR CUFF REPAIR AND SUBACROMIAL DECOMPRESSION PARTIAL ACRIOMIOPLASTY WITH CORACOACROMIAL RELEASE;  Surgeon: Evalene JONETTA Chancy, MD;  Location: [facility];  Service: Orthopedics;  Laterality: Left;   SPACE OAR INSTILLATION N/A 06/21/2020   Procedure: SPACE OAR INSTILLATION;  Surgeon: Cam Morene ORN, MD;  Location: [facility];  Service: Urology;  Laterality: N/A;   TEE WITHOUT CARDIOVERSION N/A 07/20/2018   Procedure: TRANSESOPHAGEAL ECHOCARDIOGRAM (TEE);  Surgeon: Army Dallas NOVAK, MD;  Location: [facility] OR;  Service: Open Heart Surgery;  Laterality: N/A;   TRANSURETHRAL RESECTION OF BLADDER TUMOR WITH MITOMYCIN -C  08/24/2008   @[facility]    HPI:  Alec [name] [facility]) is a 78 y.o. male who presented 11/11 with right-sided weakness and aphasia. Head CT with large L hemorrage.   CT 11/12: Interval increase in size of left basal ganglia hemorrhage, now  measuring 3.2 x 3.6 x 2.9 cm (estimated volume 17 mL, previously 11  mL). Mild increase in surrounding edema without significant midline  shift.  Pt with hx of diabetes, hypertension, CAD, CHF, RA, COPD   Assessment / Plan / Recommendation Clinical Impression  Pt presents with a severe dysarthria c/b articulatory imprecision and frequent consonant omission likely 2/2 R sided  weakness and presumed to be consistent with UUMN dysarthria.  Pt's speech is largely unintelligible even in known context today.  Pt benefited from repeat instructions to say only a single word and to say it loudly.  Vocal quality gravelly as well.  Severity of dysarthria makes evaluation of expressive language very difficult.  Pt did not sleep well per nursing report which is likely impacting performance as well.    Pt presents with receptive and presumed expressive langauge deficits. Pt was assessed using portions of the Western Aphasia Battery - Bedside Form (see below for additional informaiton).  Pt answered Y/N questions with 50% accuracy. He followed single step commands (modified from WAB task with sequential commands) with 40% accuracy independently, and 70% accuracy with visual cuing/SLP model.  Pt performed better with body movement tasks, than with environmental ones (e.g. point to the window).  Unfortunately, due to severity of dysarthria expressive language could not fully be assessed.  He was able to say stroke when asked why he was in the hospital, but remainder of answer was unintelligible.  His picture description was entirely unintelligible.  For repetition task, he appeared to say for-for-forty five and quesion neurogenic stutter as pt was also noted to say tu-tu for tape. Confrontational naming was seemigly 40-50%  correct, but was again impacted by dysarthria.  Pt is L handed and there may be some cross lateralization of language, especially given relatively improved performance with langauge tasks observed during swallowing assessment on 11/12.  SLP will work with pt in house to address the above noted deficits.  Pt will need ongoing ST at next level of care.  Consider IPR if pt is a candidate.  Western Aphasia Battery - Bedside Form Content: unable to assess Fluency: 0/10 Yes/No: 5/10 Sequential Commands: assessed with modified command following task Repetition: unable to assess  2/2 severity of dysarthria Naming: unable to assess 2/2 severity of dysarthria Bedside Aphasia Score: n/a     SLP Assessment  SLP Recommendation/Assessment: Patient needs continued Speech Language Pathology Services SLP Visit Diagnosis: Aphasia (R47.01);Dysarthria and anarthria (R47.1)     Assistance Recommended at Discharge     Functional Status Assessment Patient has had a recent decline in their functional status and demonstrates the ability to make significant improvements in function in a reasonable and predictable amount of time.  Frequency and Duration min 2x/week  2 weeks      SLP Evaluation Cognition  Overall Cognitive Status: Difficult to assess       Comprehension  Auditory Comprehension Overall Auditory Comprehension: Impaired Yes/No Questions: Impaired Basic Biographical Questions: 51-75% accurate Basic Immediate Environment Questions: 50-74% accurate Complex Questions: 50-74% accurate Commands: Impaired One Step Basic Commands: 25-49% accurate (40% independently, 70% with cuing) Interfering Components: Attention EffectiveTechniques: Repetition;Visual/Gestural cues (SLP model) Visual Recognition/Discrimination Discrimination: Not tested Reading Comprehension Reading Status: Not tested    Expression Expression Primary Mode of Expression: Verbal Verbal Expression Overall Verbal Expression: Impaired Interfering Components: Attention;Speech intelligibility   Oral / Motor  Oral Motor/Sensory Function Overall Oral Motor/Sensory Function: Moderate impairment Facial ROM: Reduced right Facial Symmetry: Abnormal symmetry right Facial Strength: Reduced right Lingual ROM: Reduced right Lingual Symmetry: Abnormal symmetry right Lingual Strength: Reduced Velum: Within Functional Limits Mandible: Within Functional Limits Motor Speech Overall Motor Speech: Impaired Respiration: Within functional limits Phonation: Hoarse Resonance: Within functional  limits Articulation: Impaired Level of Impairment: Word Intelligibility: Intelligibility reduced Word: 0-24% accurate            [name], MA, CCC-SLP Acute Rehabilitation Services Office: [phone] 08/13/2024, 9:11 AM

## 2024-08-13 NOTE — Plan of Care (Signed)
  Problem: Fluid Volume: Goal: Ability to maintain a balanced intake and output will improve Outcome: Progressing   Problem: Safety: Goal: Ability to remain free from injury will improve Outcome: Progressing   

## 2024-08-13 NOTE — Plan of Care (Signed)
   Problem: Clinical Measurements: Goal: Ability to maintain clinical measurements within normal limits will improve Outcome: Progressing

## 2024-08-13 NOTE — Progress Notes (Signed)
 Physical Therapy Treatment Patient Details Name: Alec [name] MRN: [id number] DOB: 1946-08-22 Today's Date: 08/13/2024   History of Present Illness 78 y.o. male presents to [facility] hospital on 08/10/2024 with R weakness and aphasia. CT head with finding of large L basal ganglia hemorrhage. PMH includes DM, HTN, CAD, CHF, RA, COPD.    PT Comments  The pt's BP remained stable and within parameters to progress him to performing bed mobility and attempting transfers this date. His BP did increase to 169/65 after transfers though and thus he was returned to supine with his BP recovering to 134/53. The pt does follow simple automatic cues, like sit up, roll over, and scoot up but has difficulty motor planning specific tasks, like kick your leg. He continues to display no activation in his R extremities. He also displays pushing syndrome, pushing himself to his R in sitting and standing by using his L UE. This places him at high risk for falls. He is currently requiring up to maxA to transition supine <> sit and to come to a half stand. Will continue to follow acutely.     If plan is discharge home, recommend the following: Two people to help with walking and/or transfers;Two people to help with bathing/dressing/bathroom;Assistance with feeding;Direct supervision/assist for medications management;Direct supervision/assist for financial management;Assist for transportation;Help with stairs or ramp for entrance;Supervision due to cognitive status   Can travel by private vehicle        Equipment Recommendations  Wheelchair (measurements PT);Wheelchair cushion (measurements PT);Hospital bed;Hoyer lift;BSC/3in1    Recommendations for Other Services Rehab consult     Precautions / Restrictions Precautions Precautions: Fall;Other (comment) Recall of Precautions/Restrictions: Impaired Precaution/Restrictions Comments: SBP<160, cortrak, pushes himself to his R Restrictions Weight Bearing  Restrictions Per Provider Order: No     Mobility  Bed Mobility Overal bed mobility: Needs Assistance Bed Mobility: Rolling, Supine to Sit, Sit to Supine Rolling: Min assist, Used rails   Supine to sit: Max assist, HOB elevated Sit to supine: Max assist, HOB elevated   General bed mobility comments: Pt with R leg already off R EOB upon arrival, but pt did bring L leg off R EOB when cued to sit up, needing maxA L HHA to pull trunk up to sit R EOB. Pt pushing himself to his R already, needing maxA to protect his R extremities and lift his R leg with return to supine. Pt did follow cues to roll using his L extremities, minA bil    Transfers Overall transfer level: Needs assistance Equipment used: 1 person hand held assist Transfers: Sit to/from Stand Sit to Stand: Max assist           General transfer comment: Bil knees blocked and cues provided for pt to grab onto therapist anterior to him, which he did follow with his L UE, but then he leaned heavily to his R. He needed maxA to come to a half stand 2x with pt unable to fully extend his trunk and hips due to his strong R lateral lean resulting in R lateral trunk flexion in standing.    Ambulation/Gait               General Gait Details: unable at this time   Stairs             Wheelchair Mobility     Tilt Bed    Modified Rankin (Stroke Patients Only) Modified Rankin (Stroke Patients Only) Pre-Morbid Rankin Score: No symptoms Modified Rankin: Severe disability  Balance Overall balance assessment: Needs assistance Sitting-balance support: Single extremity supported, No upper extremity supported, Feet supported Sitting balance-Leahy Scale: Poor Sitting balance - Comments: Pt pushes himself to his R with his L UE, needing maxA at times when pushing, but when L hand was placed in his lap he progressed to minA. Postural control: Right lateral lean Standing balance support: Single extremity  supported Standing balance-Leahy Scale: Zero Standing balance comment: only able to come to half stand with maxA and bil knee block with L HHA. Strong R lateral lean and lateral trunk flexion                            Communication Communication Communication: Impaired Factors Affecting Communication: Difficulty expressing self;Reduced clarity of speech  Cognition Arousal: Lethargic, Alert Behavior During Therapy: Flat affect   PT - Cognitive impairments: Awareness, Attention, Initiation, Sequencing, Problem solving, Safety/Judgement                       PT - Cognition Comments: Pt lethargic when supine, but did open eyes to verbally calling his name. Kept eyes open when sitting EOB. He follows simple commands inconsistently. He follows scoot up and roll over automatic tasks, but when cued to kick leg he was unable to do so bil. Pt verbally stated he was in [place], but did not seem aware of exact location or situation. Poor awareness of his pushing syndrome placing him at risk for falls, pushing himself to his R weak side. Poor R attention Following commands: Impaired Following commands impaired: Follows one step commands inconsistently, Follows one step commands with increased time    Cueing Cueing Techniques: Verbal cues, Gestural cues, Visual cues, Tactile cues  Exercises      General Comments General comments (skin integrity, edema, etc.): BP 156/71 supine start of session, 159/74 sitting EOB, 169/65 sitting after standing, 134/53 supine end of session      Pertinent Vitals/Pain Pain Assessment Pain Assessment: Faces Faces Pain Scale: Hurts little more Pain Location: generalized with mobility and touching feet Pain Descriptors / Indicators: Discomfort, Grimacing, Guarding Pain Intervention(s): Limited activity within patient's tolerance, Monitored during session, Repositioned    Home Living                          Prior Function             PT Goals (current goals can now be found in the care plan section) Acute Rehab PT Goals Patient Stated Goal: pt unable to state PT Goal Formulation: Patient unable to participate in goal setting Time For Goal Achievement: 08/26/24 Potential to Achieve Goals: Good Progress towards PT goals: Progressing toward goals    Frequency    Min 3X/week      PT Plan      Co-evaluation              AM-PAC PT 6 Clicks Mobility   Outcome Measure  Help needed turning from your back to your side while in a flat bed without using bedrails?: A Little Help needed moving from lying on your back to sitting on the side of a flat bed without using bedrails?: A Lot Help needed moving to and from a bed to a chair (including a wheelchair)?: Total Help needed standing up from a chair using your arms (e.g., wheelchair or bedside chair)?: Total Help needed to walk in hospital room?: Total  Help needed climbing 3-5 steps with a railing? : Total 6 Click Score: 9    End of Session   Activity Tolerance: Treatment limited secondary to medical complications (Comment);Other (comment) (elevated BP; limited by cognition) Patient left: in bed;with call bell/phone within reach;with bed alarm set Nurse Communication: Other (comment);Mobility status (BP) PT Visit Diagnosis: Hemiplegia and hemiparesis;Unsteadiness on feet (R26.81);Other abnormalities of gait and mobility (R26.89);Muscle weakness (generalized) (M62.81);Difficulty in walking, not elsewhere classified (R26.2);Other symptoms and signs involving the nervous system (R29.898) Hemiplegia - Right/Left: Right Hemiplegia - dominant/non-dominant: Dominant Hemiplegia - caused by: Nontraumatic intracerebral hemorrhage     Time: 8346-8285 PT Time Calculation (min) (ACUTE ONLY): 21 min  Charges:    $Therapeutic Activity: 8-22 mins PT General Charges $$ ACUTE PT VISIT: 1 Visit                     Theo Ferretti, PT, DPT Acute  Rehabilitation Services  Office: [phone]    Theo CHRISTELLA Ferretti 08/13/2024, 5:25 PM

## 2024-08-13 NOTE — Progress Notes (Addendum)
 STROKE TEAM PROGRESS NOTE    SIGNIFICANT HOSPITAL EVENTS 11/11: Patient admitted with left subcortical ICH  INTERIM HISTORY/SUBJECTIVE Cleviprex remains off. Repeat CT head stable  Increase basal insulin  today.  Add Coreg  3.125mg   OBJECTIVE  CBC    Component Value Date/Time   WBC 3.8 (L) 08/13/2024 0349   RBC 3.57 (L) 08/13/2024 0349   HGB 11.2 (L) 08/13/2024 0349   HGB 12.1 (L) 10/26/2021 0902   HCT 34.0 (L) 08/13/2024 0349   HCT 33.7 (L) 10/26/2021 0902   PLT 81 (L) 08/13/2024 0349   PLT 128 (L) 10/26/2021 0902   MCV 95.2 08/13/2024 0349   MCV 92 10/26/2021 0902   MCH 31.4 08/13/2024 0349   MCHC 32.9 08/13/2024 0349   RDW 13.5 08/13/2024 0349   RDW 13.6 10/26/2021 0902   LYMPHSABS 0.8 08/10/2024 1901   LYMPHSABS 1.5 10/26/2021 0902   MONOABS 0.3 08/10/2024 1901   EOSABS 0.0 08/10/2024 1901   EOSABS 0.1 10/26/2021 0902   BASOSABS 0.0 08/10/2024 1901   BASOSABS 0.1 10/26/2021 0902    BMET    Component Value Date/Time   NA 140 08/13/2024 0349   NA 141 04/04/2022 1232   K 4.5 08/13/2024 0349   CL 110 08/13/2024 0349   CO2 18 (L) 08/13/2024 0349   GLUCOSE 172 (H) 08/13/2024 0349   BUN 56 (H) 08/13/2024 0349   BUN 63 (H) 04/04/2022 1232   CREATININE 3.38 (H) 08/13/2024 0349   CREATININE 3.61 (H) 01/15/2024 0840   CALCIUM  9.0 08/13/2024 0349   EGFR 16.0 04/30/2024 0917   EGFR 17 (L) 01/15/2024 0840   EGFR 17 (L) 04/04/2022 1232   GFRNONAA 18 (L) 08/13/2024 0349    IMAGING past 24 hours CT HEAD WO CONTRAST ( ) Result Date: 08/13/2024 CLINICAL DATA:  Follow-up examination for hemorrhagic stroke. EXAM: CT HEAD WITHOUT CONTRAST TECHNIQUE: Contiguous axial images were obtained from the base of the skull through the vertex without intravenous contrast. RADIATION DOSE REDUCTION: This exam was performed according to the departmental dose-optimization program which includes automated exposure control, adjustment of the mA and/or kV according to patient size and/or  use of iterative reconstruction technique. COMPARISON:  Comparison made with prior CT from 08/11/2024 and MRI from 08/12/2024. FINDINGS: Brain: Previously identified intraparenchymal hemorrhage centered at the left basal ganglia again seen, not significantly changed in size or morphology measuring 3.5 x 3.8 x 2.9 cm. Surrounding vasogenic edema is relatively similar. Mass effect on the adjacent left lateral ventricle which is partially effaced. Associated 4 mm of left-to-right shift, not significantly changed. No hydrocephalus or trapping. Basilar cisterns remain patent. Small volume intraventricular hemorrhage with blood seen layering within the occipital horns bilaterally, stable from prior MRI. No other new acute intracranial hemorrhage. No other acute large vessel territory infarct. No mass lesion or extra-axial fluid collection. Vascular: No abnormal hyperdense vessel. Calcified atherosclerosis present at the skull base. Skull: Scalp soft tissues and calvarium demonstrate no new finding. Sinuses/Orbits: Globes orbital soft tissues demonstrate no acute finding. Ocular senescent calcifications noted. Scattered mucoperiosteal thickening present about the sphenoid ethmoidal and maxillary sinuses. Mastoid air cells remain largely clear. Nasogastric tube in place. Other: None. IMPRESSION: 1. No significant interval change in size and morphology of intraparenchymal hemorrhage centered at the left basal ganglia. Surrounding vasogenic edema with regional mass effect and 4 mm of left-to-right shift, not appreciably changed. 2. Small volume intraventricular hemorrhage, stable from prior MRI. No hydrocephalus or trapping. 3. No other new acute intracranial abnormality. Electronically Signed  By: Morene Hoard M.D.   On: 08/13/2024 03:19    Vitals:   08/13/24 0537 08/13/24 0600 08/13/24 0700 08/13/24 0800  BP: (!) 145/71 (!) 145/85 (!) 152/61   Pulse: 69 73 71   Resp: (!) 23 (!) 23 16   Temp:    97.9 F  (36.6 C)  TempSrc:    Axillary  SpO2: 98% 97% 98%   Weight:         PHYSICAL EXAM General:  Alert, well-nourished, well-developed elderly patient in no acute distress Psych:  Mood and affect appropriate for situation CV: Regular rate and rhythm on monitor Respiratory:  Regular, unlabored respirations on room air   NEURO:  Mental Status: Patient is able to state his name his age and the city he is not Speech/Language: speech is with severe dysarthria and impaired naming but intact repetition  Cranial Nerves:  II: PERRL.   III, IV, VI: Left gaze preference but able to cross midline VII: Right facial droop VIII: hearing intact to voice. IX, X: Voice is very dysarthric XII: tongue is midline Motor: Moves left upper extremity and left lower extremity with antigravity strength, flicker of movement right upper extremity to noxious, able to follow commands with right lower extremity Tone: is normal and bulk is normal Sensation- Intact to light touch bilaterally.  Coordination: FTN intact on the left, unable to perform on the right Gait- deferred  Most Recent NIH  1a Level of Conscious.: 0 1b LOC Questions: 1 1c LOC Commands: 0 2 Best Gaze: 1 3 Visual: 2 4 Facial Palsy: 1 5a Motor Arm - left: 0 5b Motor Arm - Right: 4 6a Motor Leg - Left: 2 6b Motor Leg - Right: 3 7 Limb Ataxia: 0 8 Sensory: 0 9 Best Language: 1 10 Dysarthria: 2 11 Extinct. and Inatten.: 0 TOTAL: 17   ASSESSMENT/PLAN  Mr. LYCAN DAVEE is a 78 y.o. male with history of diabetes, hypertension, CAD, CHF, rheumatoid arthritis and COPD admitted for right-sided weakness and aphasia.  He was found to have a left subcortical ICH.    ICH: Left BG ICH, etiology: Likely hypertensive Code Stroke CT head 11 mm acute IPH in left basal ganglia with no IVH Follow-up CT 08/11/02/2023 interval increase in size of left basal ganglia hemorrhage now at 17 mL with mild increase in surrounding edema without midline  shift Follow-up CT 11/12 at 1220 IPH within the left basal ganglia with mild surrounding edema and midline shift measuring 4 mm, stable from prior study MRI left basal ganglia IPH similar to prior exam with mild rightward shift of midline structures by 4 mm  MRA no LVO or significant stenosis 11/14 - CT Head repeat-stable hematoma and midline shift Carotid ultrasound unremarkable 2D Echo EF 55-60%, moderate eccentric left ventricular hypertrophy  LDL 43 HgbA1c 6.0 UDS neg VTE prophylaxis -SCDs aspirin  81 mg daily prior to admission, now on No antithrombotic secondary to IPH Therapy recommendations:  CIR Disposition: Pending  Hypertension Home meds: Losartan 5 mg daily, hydralazine  100 mg 3 times daily, benica 5mg  and lasix  Amlodipine  10mg , Hydralazine  100mg  TID, add coreg  6.25 mg BP goal < 160 Long term BP goal normotensive  Hyperlipidemia Home meds: Atorvastatin  40 mg daily LDL 43, goal < 70 Resume statin  Continue statin at discharge  Diabetes type II Controlled Home meds: Insulin  aspart via insulin  pump  HgbA1c 6.0, goal < 7.0 Hyperglycemia CBGs SSI Increase basal insulin  from 10 to 15u Recommend close follow-up with PCP for better  DM control  CKD 4 Cre 4.01->3.72- 3.54 - 3.38 On TF BMP monitoring Hold off lasix , ARB  Dysphagia Patient has post-stroke dysphagia, SLP consulted NPO, did not pass swallow Core track placed  On TF @ 55 MBS tomorrow? SLP following   Other Stroke Risk Factors Advanced age Obesity, Body mass index is 31.17 kg/m., BMI >/= 30 associated with increased stroke risk, recommend weight loss, diet and exercise as appropriate  Coronary artery disease Congestive heart failure  Other Active Problems RA COPD Thrombocytopenia platelet 91->88->93 - 81 Mild leukopenia WBC 3.3-4.9 - 3.8  Hospital day # 3  Patient seen and examined by NP/APP with MD. MD to update note as needed.   Jorene Last, DNP, FNP-BC [facility] Pager:  [phone]  ATTENDING NOTE: I reviewed above note and agree with the assessment and plan. Pt was seen and examined.   Wife at bedside.  Patient still lethargic, however open eyes on voice, severe dysarthria, intangible voice, however able to repeat 3 word sentences in severely dysarthric voice.  Not follow simple commands but able to pantomime on the left hand.  Still has right hemiplegia with mild withdrawal to pain stimulation.  BP stable but on the high end, add Coreg .  Glucose still on the high end, increase basal insulin .  Speech on board.  PT and OT recommend CIR.  Repeat CT this morning shows stable hematoma and midline shift.  For detailed assessment and plan, please refer to above as I have made changes wherever appropriate.   Ary Cummins, MD PhD Stroke Neurology 08/13/2024 6:46 PM  This patient is critically ill due to ICH, hypertensive emergency, CKD4 and at significant risk of neurological worsening, death form hematoma expansion, brain herniation, cerebral edema and renal failure. This patient's care requires constant monitoring of vital signs, hemodynamics, respiratory and cardiac monitoring, review of multiple databases, neurological assessment, discussion with family, other specialists and medical decision making of high complexity. I spent 35 minutes of neurocritical care time in the care of this patient. I had long discussion with wife at bedside, updated pt current condition, treatment plan and potential prognosis, and answered all the questions. She expressed understanding and appreciation.    To contact Stroke Continuity provider, please refer to [email or web address]. After hours, contact General Neurology

## 2024-08-13 NOTE — Progress Notes (Signed)
 Speech Language Pathology Treatment: Dysphagia  Patient Details Name: Alec [name] MRN: [id number] DOB: Aug 03, 1946 Today's Date: 08/13/2024 Time: 9184-9175 SLP Time Calculation (min) (ACUTE ONLY): 9 min  Assessment / Plan / Recommendation Clinical Impression  Pt seen for ongoing dysphagia management.  Pt awake, alert.  SLP provided oral care prior to administration of PO trials. Pt with cough x1 with thin liquid by spoon.  Pt tolerated additional trial of water by spoon, ice chips, and cup sips of nectar thick liquid without any clinical s/s of aspiration.  Pt exhibited improved oral clearance of puree today with only mild R residue.  Unfortunately, pt continues to be somewhat drowsy.  As SLP contineud to work with pt for speech and language (see evaluation note) pt became increasing inattentive and possibly lethargic.  RN reports pt did not sleep well overnight.  Suspect pt would not be able to maintain alert state for MBS later in day.  Pt with cortrak in pace and would like continue to need nutritional support regardless of outcome of instrumental assessment.  Will defer repeat instrumental evaluation today.  Recommend pt remain NPO with alternate means of nutrition, hydration, and medication. Pt may have ice chips and unthickened water by spoon, in moderation, after good oral care, when fully awake/alert, with upright positioning and direct supervision.     HPI HPI: Alec [name]) is a 78 y.o. male who presented 11/11 with right-sided weakness and aphasia. Head CT with large L hemorrage.   CT 11/12: Interval increase in size of left basal ganglia hemorrhage, now  measuring 3.2 x 3.6 x 2.9 cm (estimated volume 17 mL, previously 11  mL). Mild increase in surrounding edema without significant midline  shift.  Pt with hx of diabetes, hypertension, CAD, CHF, RA, COPD      SLP Plan  Continue with current plan of care          Recommendations  Diet recommendations:  NPO Medication Administration: Via alternative means                  Oral care QID;Oral care prior to ice chip/H20     Dysphagia, oropharyngeal phase (R13.12)     Continue with current plan of care     Alec FORBES Grippe, MA, CCC-SLP Acute Rehabilitation Services Office: [phone] 08/13/2024, 8:50 AM

## 2024-08-14 DIAGNOSIS — I610 Nontraumatic intracerebral hemorrhage in hemisphere, subcortical: Secondary | ICD-10-CM | POA: Diagnosis not present

## 2024-08-14 DIAGNOSIS — I161 Hypertensive emergency: Secondary | ICD-10-CM | POA: Diagnosis not present

## 2024-08-14 DIAGNOSIS — E1122 Type 2 diabetes mellitus with diabetic chronic kidney disease: Secondary | ICD-10-CM | POA: Diagnosis not present

## 2024-08-14 DIAGNOSIS — I130 Hypertensive heart and chronic kidney disease with heart failure and stage 1 through stage 4 chronic kidney disease, or unspecified chronic kidney disease: Secondary | ICD-10-CM | POA: Diagnosis not present

## 2024-08-14 LAB — BASIC METABOLIC PANEL WITH GFR
Anion gap: 10 (ref 5–15)
BUN: 69 mg/dL — ABNORMAL HIGH (ref 8–23)
CO2: 18 mmol/L — ABNORMAL LOW (ref 22–32)
Calcium: 9.1 mg/dL (ref 8.9–10.3)
Chloride: 113 mmol/L — ABNORMAL HIGH (ref 98–111)
Creatinine, Ser: 3.47 mg/dL — ABNORMAL HIGH (ref 0.61–1.24)
GFR, Estimated: 18 mL/min — ABNORMAL LOW (ref >60)
Glucose, Bld: 198 mg/dL — ABNORMAL HIGH (ref 70–99)
Potassium: 4.9 mmol/L (ref 3.5–5.1)
Sodium: 141 mmol/L (ref 135–145)

## 2024-08-14 LAB — CBC
HCT: 32.5 % — ABNORMAL LOW (ref 39.0–52.0)
Hemoglobin: 10.9 g/dL — ABNORMAL LOW (ref 13.0–17.0)
MCH: 31.6 pg (ref 26.0–34.0)
MCHC: 33.5 g/dL (ref 30.0–36.0)
MCV: 94.2 fL (ref 80.0–100.0)
Platelets: 84 K/uL — ABNORMAL LOW (ref 150–400)
RBC: 3.45 MIL/uL — ABNORMAL LOW (ref 4.22–5.81)
RDW: 13.6 % (ref 11.5–15.5)
WBC: 3.9 K/uL — ABNORMAL LOW (ref 4.0–10.5)
nRBC: 0 % (ref 0.0–0.2)

## 2024-08-14 LAB — GLUCOSE, CAPILLARY
Glucose-Capillary: 203 mg/dL — ABNORMAL HIGH (ref 70–99)
Glucose-Capillary: 250 mg/dL — ABNORMAL HIGH (ref 70–99)
Glucose-Capillary: 191 mg/dL — ABNORMAL HIGH (ref 70–99)
Glucose-Capillary: 261 mg/dL — ABNORMAL HIGH (ref 70–99)
Glucose-Capillary: 169 mg/dL — ABNORMAL HIGH (ref 70–99)

## 2024-08-14 LAB — MAGNESIUM: Magnesium: 2.6 mg/dL — ABNORMAL HIGH (ref 1.7–2.4)

## 2024-08-14 LAB — PHOSPHORUS: Phosphorus: 3.3 mg/dL (ref 2.5–4.6)

## 2024-08-14 MED ORDER — QUETIAPINE FUMARATE 25 MG PO TABS
25.0000 mg | ORAL_TABLET | Freq: Every day | ORAL | Status: DC
  Administered 2024-08-14 – 2024-08-22 (×9): 25 mg
  Filled 2024-08-14 (×9): qty 1

## 2024-08-14 MED ORDER — QUETIAPINE FUMARATE 25 MG PO TABS: 12.5000 mg | ORAL_TABLET | Freq: Every evening | ORAL | Status: DC | PRN

## 2024-08-14 MED ORDER — CARVEDILOL 12.5 MG PO TABS
12.5000 mg | ORAL_TABLET | Freq: Two times a day (BID) | ORAL | Status: DC
  Administered 2024-08-15: 12.5 mg
  Filled 2024-08-14: qty 1

## 2024-08-14 MED ORDER — INSULIN GLARGINE-YFGN 100 UNIT/ML ~~LOC~~ SOLN
20.0000 [IU] | Freq: Every day | SUBCUTANEOUS | Status: DC
  Administered 2024-08-15: 20 [IU] via SUBCUTANEOUS
  Filled 2024-08-14: qty 0.2

## 2024-08-14 NOTE — Progress Notes (Addendum)
 STROKE TEAM PROGRESS NOTE    SIGNIFICANT HOSPITAL EVENTS 11/11: Patient admitted with left subcortical ICH  INTERIM HISTORY/SUBJECTIVE Restless throughout the night. May try low dose of Seroquel at night to help with restlessness. Delirium precautions.  Transfer out of ICU today, hospitalist to pick up tomorrow.  OBJECTIVE  CBC    Component Value Date/Time   WBC 3.9 (L) 08/14/2024 0234   RBC 3.45 (L) 08/14/2024 0234   HGB 10.9 (L) 08/14/2024 0234   HGB 12.1 (L) 10/26/2021 0902   HCT 32.5 (L) 08/14/2024 0234   HCT 33.7 (L) 10/26/2021 0902   PLT 84 (L) 08/14/2024 0234   PLT 128 (L) 10/26/2021 0902   MCV 94.2 08/14/2024 0234   MCV 92 10/26/2021 0902   MCH 31.6 08/14/2024 0234   MCHC 33.5 08/14/2024 0234   RDW 13.6 08/14/2024 0234   RDW 13.6 10/26/2021 0902   LYMPHSABS 0.8 08/10/2024 1901   LYMPHSABS 1.5 10/26/2021 0902   MONOABS 0.3 08/10/2024 1901   EOSABS 0.0 08/10/2024 1901   EOSABS 0.1 10/26/2021 0902   BASOSABS 0.0 08/10/2024 1901   BASOSABS 0.1 10/26/2021 0902    BMET    Component Value Date/Time   NA 141 08/14/2024 0234   NA 141 04/04/2022 1232   K 4.9 08/14/2024 0234   CL 113 (H) 08/14/2024 0234   CO2 18 (L) 08/14/2024 0234   GLUCOSE 198 (H) 08/14/2024 0234   BUN 69 (H) 08/14/2024 0234   BUN 63 (H) 04/04/2022 1232   CREATININE 3.47 (H) 08/14/2024 0234   CREATININE 3.61 (H) 01/15/2024 0840   CALCIUM  9.1 08/14/2024 0234   EGFR 16.0 04/30/2024 0917   EGFR 17 (L) 01/15/2024 0840   EGFR 17 (L) 04/04/2022 1232   GFRNONAA 18 (L) 08/14/2024 0234    IMAGING past 24 hours No results found.   Vitals:   08/14/24 0515 08/14/24 0600 08/14/24 0700 08/14/24 0800  BP: (!) 175/85 (!) 148/59 136/69 (!) 144/65  Pulse:  69 75 74  Resp:  (!) 30 (!) 28   Temp:    97.7 F (36.5 C)  TempSrc:    Axillary  SpO2:  95% 97% 96%  Weight:         PHYSICAL EXAM General:  Alert, well-nourished, well-developed elderly patient in no acute distress Psych:  Mood and affect  appropriate for situation CV: Regular rate and rhythm on monitor Respiratory:  Regular, unlabored respirations on room air   NEURO:  Mental Status: Patient is able to state his name his age and the city he is not. Follows commands on the left  Speech/Language: speech is with severe dysarthria and impaired naming but intact repetition  Cranial Nerves:  II: PERRL.   III, IV, VI: Left gaze preference but able to cross midline VII: Right facial droop VIII: hearing intact to voice. IX, X: Voice is very dysarthric XII: tongue is midline Motor: Moves left upper extremity and left lower extremity with antigravity strength, flicker of movement right upper extremity to noxious, able to follow commands with right lower extremity Tone: is normal and bulk is normal Sensation- Intact to light touch bilaterally.  Coordination: FTN intact on the left, unable to perform on the right Gait- deferred  ASSESSMENT/PLAN  Alec [name] is a 78 y.o. male with history of diabetes, hypertension, CAD, CHF, rheumatoid arthritis and COPD admitted for right-sided weakness and aphasia.  He was found to have a left subcortical ICH.    ICH: Left BG ICH, etiology: Likely hypertensive Code  Stroke CT head 11 mm acute IPH in left basal ganglia with no IVH Follow-up CT 08/11/02/2023 interval increase in size of left basal ganglia hemorrhage now at 17 mL with mild increase in surrounding edema without midline shift Follow-up CT 11/12 at 1220 IPH within the left basal ganglia with mild surrounding edema and midline shift measuring 4 mm, stable from prior study MRI left basal ganglia IPH similar to prior exam with mild rightward shift of midline structures by 4 mm  MRA no LVO or significant stenosis 11/14 - CT Head repeat-stable hematoma and midline shift Carotid ultrasound unremarkable 2D Echo EF 55-60%, moderate eccentric left ventricular hypertrophy  LDL 43 HgbA1c 6.0 UDS neg VTE prophylaxis - heparin   subq aspirin  81 mg daily prior to admission, now on No antithrombotic secondary to IPH Therapy recommendations:  CIR Disposition: Pending  Hypertension Home meds: Losartan 5 mg daily, hydralazine  100 mg 3 times daily, benica 5mg  and lasix  BP stable on the high end Amlodipine  10mg , Hydralazine  100mg  TID, add coreg  6.25->12.5 mg BP goal < 160 Long term BP goal normotensive  Hyperlipidemia Home meds: Atorvastatin  40 mg daily LDL 43, goal < 70 Resume statin  Continue statin at discharge  Diabetes type II Controlled Home meds: Insulin  aspart via insulin  pump  HgbA1c 6.0, goal < 7.0 Hyperglycemia CBGs SSI Increase basal insulin  from 10 ->15->20u Recommend close follow-up with PCP for better DM control  CKD 4 Cre 4.01->3.72- 3.54 - 3.38--3.47 On TF BMP monitoring Hold off lasix , ARB  Dysphagia Patient has post-stroke dysphagia, SLP consulted NPO, did not pass swallow Core track placed  On TF @ 55  Other Stroke Risk Factors Advanced age Obesity, Body mass index is 31.14 kg/m., BMI >/= 30 associated with increased stroke risk, recommend weight loss, diet and exercise as appropriate  Coronary artery disease Congestive heart failure  Other Active Problems RA COPD Thrombocytopenia platelet 91->88->93 - 81--84 Mild leukopenia WBC 3.3-4.9 - 3.8--3.9 Restless throughout the night  Seroquel 25 mg at bedtime daily Delirium precautions  Hospital day # 4  Patient seen and examined by NP/APP with MD. MD to update note as needed.   Jorene Last, DNP, FNP-BC [facility] Pager: [phone]  ATTENDING NOTE: I reviewed above note and agree with the assessment and plan. Pt was seen and examined.   Wife at the bedside. Pt lethargic but open eyes on voice. Per RN pt did not get good sleep again last night. Will given seroquel 25mg  at bedtime for sleeping. Pt today still has severe dysarthria with intangible words, but able to follow simple commands on the L hand.  Still has severe R hemiplegia but mild withdraw to pain.   Pt BP still on the high end, will increase coreg  dose. Glucose also on the high end, will increase insulin  dose too. CKD stable, on TF. PT and OT recommend CIR.  For detailed assessment and plan, please refer to above as I have made changes wherever appropriate.   Ary Cummins, MD PhD Stroke Neurology 08/14/2024 9:51 PM  This patient is critically ill due to ICH, hypertensive emergency, CKD4 and at significant risk of neurological worsening, death form hematoma expansion, brain herniation, cerebral edema and renal failure. This patient's care requires constant monitoring of vital signs, hemodynamics, respiratory and cardiac monitoring, review of multiple databases, neurological assessment, discussion with family, other specialists and medical decision making of high complexity. I spent 35 minutes of neurocritical care time in the care of this patient. I had long discussion with  wife at bedside, updated pt current condition, treatment plan and potential prognosis, and answered all the questions. She expressed understanding and appreciation.    To contact Stroke Continuity provider, please refer to [email or web address]. After hours, contact General Neurology

## 2024-08-14 NOTE — Progress Notes (Signed)
 Attempted to call pts wife to make her aware of his transfer. No answer on home or mobile at this time.

## 2024-08-15 DIAGNOSIS — I619 Nontraumatic intracerebral hemorrhage, unspecified: Secondary | ICD-10-CM | POA: Diagnosis not present

## 2024-08-15 DIAGNOSIS — I610 Nontraumatic intracerebral hemorrhage in hemisphere, subcortical: Secondary | ICD-10-CM | POA: Diagnosis not present

## 2024-08-15 DIAGNOSIS — E1122 Type 2 diabetes mellitus with diabetic chronic kidney disease: Secondary | ICD-10-CM | POA: Diagnosis not present

## 2024-08-15 DIAGNOSIS — I130 Hypertensive heart and chronic kidney disease with heart failure and stage 1 through stage 4 chronic kidney disease, or unspecified chronic kidney disease: Secondary | ICD-10-CM | POA: Diagnosis not present

## 2024-08-15 DIAGNOSIS — I509 Heart failure, unspecified: Secondary | ICD-10-CM | POA: Diagnosis not present

## 2024-08-15 LAB — GLUCOSE, CAPILLARY
Glucose-Capillary: 183 mg/dL — ABNORMAL HIGH (ref 70–99)
Glucose-Capillary: 201 mg/dL — ABNORMAL HIGH (ref 70–99)
Glucose-Capillary: 225 mg/dL — ABNORMAL HIGH (ref 70–99)
Glucose-Capillary: 243 mg/dL — ABNORMAL HIGH (ref 70–99)
Glucose-Capillary: 244 mg/dL — ABNORMAL HIGH (ref 70–99)
Glucose-Capillary: 257 mg/dL — ABNORMAL HIGH (ref 70–99)
Glucose-Capillary: 263 mg/dL — ABNORMAL HIGH (ref 70–99)

## 2024-08-15 LAB — CBC
HCT: 33.1 % — ABNORMAL LOW (ref 39.0–52.0)
Hemoglobin: 10.8 g/dL — ABNORMAL LOW (ref 13.0–17.0)
MCH: 31 pg (ref 26.0–34.0)
MCHC: 32.6 g/dL (ref 30.0–36.0)
MCV: 95.1 fL (ref 80.0–100.0)
Platelets: 100 K/uL — ABNORMAL LOW (ref 150–400)
RBC: 3.48 MIL/uL — ABNORMAL LOW (ref 4.22–5.81)
RDW: 13.7 % (ref 11.5–15.5)
WBC: 3.6 K/uL — ABNORMAL LOW (ref 4.0–10.5)
nRBC: 0 % (ref 0.0–0.2)

## 2024-08-15 LAB — BASIC METABOLIC PANEL WITH GFR
Anion gap: 11 (ref 5–15)
BUN: 90 mg/dL — ABNORMAL HIGH (ref 8–23)
CO2: 20 mmol/L — ABNORMAL LOW (ref 22–32)
Calcium: 9.2 mg/dL (ref 8.9–10.3)
Chloride: 111 mmol/L (ref 98–111)
Creatinine, Ser: 3.58 mg/dL — ABNORMAL HIGH (ref 0.61–1.24)
GFR, Estimated: 17 mL/min — ABNORMAL LOW (ref >60)
Glucose, Bld: 241 mg/dL — ABNORMAL HIGH (ref 70–99)
Potassium: 5 mmol/L (ref 3.5–5.1)
Sodium: 142 mmol/L (ref 135–145)

## 2024-08-15 MED ORDER — CARVEDILOL 12.5 MG PO TABS
12.5000 mg | ORAL_TABLET | Freq: Two times a day (BID) | ORAL | Status: DC
  Administered 2024-08-15: 12.5 mg
  Filled 2024-08-15: qty 1

## 2024-08-15 MED ORDER — INSULIN GLARGINE-YFGN 100 UNIT/ML ~~LOC~~ SOLN
25.0000 [IU] | Freq: Every day | SUBCUTANEOUS | Status: DC
  Administered 2024-08-16: 25 [IU] via SUBCUTANEOUS
  Filled 2024-08-15: qty 0.25

## 2024-08-15 MED ORDER — LABETALOL HCL 5 MG/ML IV SOLN
10.0000 mg | Freq: Once | INTRAVENOUS | Status: AC
  Administered 2024-08-15: 10 mg via INTRAVENOUS
  Filled 2024-08-15: qty 4

## 2024-08-15 MED ORDER — LABETALOL HCL 5 MG/ML IV SOLN
40.0000 mg | INTRAVENOUS | Status: DC | PRN
  Administered 2024-08-15 – 2024-08-29 (×7): 40 mg via INTRAVENOUS
  Filled 2024-08-15 (×8): qty 8

## 2024-08-15 MED ORDER — CARVEDILOL 12.5 MG PO TABS
25.0000 mg | ORAL_TABLET | Freq: Two times a day (BID) | ORAL | Status: DC
  Administered 2024-08-16 – 2024-08-17 (×3): 25 mg
  Filled 2024-08-15 (×3): qty 2

## 2024-08-15 MED ORDER — CARVEDILOL 12.5 MG PO TABS: 25.0000 mg | ORAL_TABLET | Freq: Two times a day (BID) | ORAL | Status: DC

## 2024-08-15 NOTE — Plan of Care (Signed)
  Problem: Intracerebral Hemorrhage Tissue Perfusion: Goal: Complications of Intracerebral Hemorrhage will be minimized Outcome: Progressing   Problem: Coping: Goal: Will verbalize positive feelings about self Outcome: Progressing Goal: Will identify appropriate support needs Outcome: Progressing   Problem: Health Behavior/Discharge Planning: Goal: Ability to manage health-related needs will improve Outcome: Progressing Goal: Goals will be collaboratively established with patient/family Outcome: Progressing

## 2024-08-15 NOTE — Progress Notes (Signed)
 STROKE TEAM PROGRESS NOTE    SIGNIFICANT HOSPITAL EVENTS 11/11: Patient admitted with left subcortical ICH  INTERIM HISTORY/SUBJECTIVE Patient has not been transferred to the floor.  He is lying comfortably in bed.  His wife is at the bedside.  Patient is less agitated today.  Can be aroused and follows some commands but not consistently.  Speech is dysarthric.  Dense  right hemiplegia and able to move left side purposefully against gravity  OBJECTIVE  CBC    Component Value Date/Time   WBC 3.6 (L) 08/15/2024 0315   RBC 3.48 (L) 08/15/2024 0315   HGB 10.8 (L) 08/15/2024 0315   HGB 12.1 (L) 10/26/2021 0902   HCT 33.1 (L) 08/15/2024 0315   HCT 33.7 (L) 10/26/2021 0902   PLT 100 (L) 08/15/2024 0315   PLT 128 (L) 10/26/2021 0902   MCV 95.1 08/15/2024 0315   MCV 92 10/26/2021 0902   MCH 31.0 08/15/2024 0315   MCHC 32.6 08/15/2024 0315   RDW 13.7 08/15/2024 0315   RDW 13.6 10/26/2021 0902   LYMPHSABS 0.8 08/10/2024 1901   LYMPHSABS 1.5 10/26/2021 0902   MONOABS 0.3 08/10/2024 1901   EOSABS 0.0 08/10/2024 1901   EOSABS 0.1 10/26/2021 0902   BASOSABS 0.0 08/10/2024 1901   BASOSABS 0.1 10/26/2021 0902    BMET    Component Value Date/Time   NA 142 08/15/2024 0315   NA 141 04/04/2022 1232   K 5.0 08/15/2024 0315   CL 111 08/15/2024 0315   CO2 20 (L) 08/15/2024 0315   GLUCOSE 241 (H) 08/15/2024 0315   BUN 90 (H) 08/15/2024 0315   BUN 63 (H) 04/04/2022 1232   CREATININE 3.58 (H) 08/15/2024 0315   CREATININE 3.61 (H) 01/15/2024 0840   CALCIUM  9.2 08/15/2024 0315   EGFR 16.0 04/30/2024 0917   EGFR 17 (L) 01/15/2024 0840   EGFR 17 (L) 04/04/2022 1232   GFRNONAA 17 (L) 08/15/2024 0315    IMAGING past 24 hours No results found.   Vitals:   08/15/24 0020 08/15/24 0120 08/15/24 0349 08/15/24 1300  BP: (!) 171/63 (!) 142/60 (!) 187/56 (!) 157/66  Pulse: 83  77 73  Resp: 19 (!) 22 20 19   Temp: 98.3 F (36.8 C)  98.4 F (36.9 C) 97.7 F (36.5 C)  TempSrc: Oral  Oral  Axillary  SpO2: 98%  99% 97%  Weight:         PHYSICAL EXAM General:   well-nourished, well-developed elderly patient in no acute distress Psych:  Mood and affect appropriate for situation CV: Regular rate and rhythm on monitor Respiratory:  Regular, unlabored respirations on room air   NEURO:  Mental Status: Patient is drowsy but can be aroused.  Patient is able to state his name his age and the city he is not. Follows commands on the left  Speech/Language: speech is with severe dysarthria and impaired naming but intact repetition  Cranial Nerves:  II: PERRL.   III, IV, VI: Left gaze preference but able to cross midline VII: Right facial droop VIII: hearing intact to voice. IX, X: Voice is very dysarthric XII: tongue is midline Motor: Moves left upper extremity and left lower extremity with antigravity strength, flicker of movement right upper extremity to noxious, able to follow commands with right lower extremity Tone: is normal and bulk is normal Sensation- Intact to light touch bilaterally.  Coordination: FTN intact on the left, unable to perform on the right Gait- deferred  ASSESSMENT/PLAN  Alec [name] is a  78 y.o. male with history of diabetes, hypertension, CAD, CHF, rheumatoid arthritis and COPD admitted for right-sided weakness and aphasia.  He was found to have a left subcortical ICH.    ICH: Left BG ICH, etiology: Likely hypertensive Code Stroke CT head 11 mm acute IPH in left basal ganglia with no IVH Follow-up CT 08/11/02/2023 interval increase in size of left basal ganglia hemorrhage now at 17 mL with mild increase in surrounding edema without midline shift Follow-up CT 11/12 at 1220 IPH within the left basal ganglia with mild surrounding edema and midline shift measuring 4 mm, stable from prior study MRI left basal ganglia IPH similar to prior exam with mild rightward shift of midline structures by 4 mm  MRA no LVO or significant stenosis 11/14 - CT Head  repeat-stable hematoma and midline shift Carotid ultrasound unremarkable 2D Echo EF 55-60%, moderate eccentric left ventricular hypertrophy  LDL 43 HgbA1c 6.0 UDS neg VTE prophylaxis - heparin  subq aspirin  81 mg daily prior to admission, now on No antithrombotic secondary to IPH Therapy recommendations:  CIR Disposition: Pending  Hypertension Home meds: Losartan 5 mg daily, hydralazine  100 mg 3 times daily, benica 5mg  and lasix  BP stable on the high end Amlodipine  10mg , Hydralazine  100mg  TID, add coreg  6.25->12.5 mg BP goal < 160 Long term BP goal normotensive  Hyperlipidemia Home meds: Atorvastatin  40 mg daily LDL 43, goal < 70 Resume statin  Continue statin at discharge  Diabetes type II Controlled Home meds: Insulin  aspart via insulin  pump  HgbA1c 6.0, goal < 7.0 Hyperglycemia CBGs SSI Increase basal insulin  from 10 ->15->20u Recommend close follow-up with PCP for better DM control  CKD 4 Cre 4.01->3.72- 3.54 - 3.38--3.47 On TF BMP monitoring Hold off lasix , ARB  Dysphagia Patient has post-stroke dysphagia, SLP consulted NPO, did not pass swallow Core track placed  On TF @ 55  Other Stroke Risk Factors Advanced age Obesity, Body mass index is 31.14 kg/m., BMI >/= 30 associated with increased stroke risk, recommend weight loss, diet and exercise as appropriate  Coronary artery disease Congestive heart failure  Other Active Problems RA COPD Thrombocytopenia platelet 91->88->93 - 81--84 Mild leukopenia WBC 3.3-4.9 - 3.8--3.9 Restless throughout the night  Seroquel 25 mg at bedtime daily Delirium precautions  Hospital day # 5    Wife at the bedside. Pt lethargic but open eyes on voice.neurological exam is unchanged and still has severe dysarthria with intangible words, but able to follow simple commands on the L hand. Still has severe R hemiplegia but mild withdraw to pain.   Pt BP still on the high end,have increased coreg  dose medical team to manage  sugar and renal function.  Therapist recommend CIR. Discussed with Dr. Cindy    I personally spent a total of 35 minutes in the care of the patient today including getting/reviewing separately obtained history, performing a medically appropriate exam/evaluation, counseling and educating, placing orders, referring and communicating with other health care professionals, documenting clinical information in the EHR, independently interpreting results, and coordinating care.   Alec Popp, MD      To contact Stroke Continuity provider, please refer to [email or web address]. After hours, contact General Neurology

## 2024-08-15 NOTE — Progress Notes (Signed)
 Patient BP elevated to systolic 160-185 before scheduled hydralazine  given.  Gave scheduled hydralazine , rechecked BP in one hour, systolic 195.  Dr Vanessa made aware, order received to bladder scan patient and administer labetalol 10 mg IV.  Bladder scan performed, 357 ml scanned, 550 ml on in and out cath removed.  Medication administered, bp.

## 2024-08-15 NOTE — Hospital Course (Addendum)
 78yo with hx DM, HTN, CAD, CHF, RA, COPD, and chronic pancytopenia who initially presented on 11/11 with R sided weakness and aphasia, found to have L sided subcortical ICH suspected due to uncontrolled hypertension. Hospital course complicated with acute encephalopathy, dysphagia, aspiration pneumonia, acute respiratory failure requiring mechanical ventilation then extubated on 11/21, AKI on CKD 4, hypernatremia, and hyperkalemia.

## 2024-08-15 NOTE — Plan of Care (Signed)
  Problem: Coping: Goal: Will verbalize positive feelings about self Outcome: Not Progressing   Problem: Education: Goal: Ability to describe self-care measures that may prevent or decrease complications (Diabetes Survival Skills Education) will improve Outcome: Not Progressing   Problem: Safety: Goal: Ability to remain free from injury will improve Outcome: Not Progressing

## 2024-08-15 NOTE — Progress Notes (Signed)
  Progress Note   Patient: Alec [name] FMW:[id number] DOB: Dec 11, 1945 DOA: 08/10/2024     5 DOS: the patient was seen and examined on 08/15/2024   Brief hospital course: 78yo with hx DM, HTN, CAD, CHF, RA, COPD who initially presented with R sided weakness and aphasia, found to have L sided subcortical ICH  Assessment and Plan: ICH -Noted on CT and MRI -likely secondary to HTN -was on ASA PTA, now on hold -Neurology following. Will cont to manage HTN  HTN -BP remains labile and suboptimally controlled -Currently on norvasc  10mg , Coreg  12.5mg  BID, Hydralazine  100mg  q8h, -Will increase coreg  to 25mg  bid   HLD -cont statin -LDL 43  DM2, uncontrolled with hyperglycemia -glucose trends in the mid-200's -Noted to have been on 100u/day per insulin  pump PTA -Will increase semglee  to 25 u. Cont to titrate insulin  with goal of euglycemia  CKD4 -Cr appears to be stable at this time -Bladder outlet obstruction noted as below -Recheck bmet in AM  Bladder outlet obstruction ->400cc noted on bladder scan, requiring I/o cath -If pt continues needing I/O cath over 24h, then would plan indwelling foley at that time  Dysphagia -Currently coretrak dependent -Not following commands at this time -If pt is unable to wean off coretrak, then may need PEG  COPD -on minimal O2 support -No audible wheezing     Subjective: Unable to assess given mentation  Physical Exam: Vitals:   08/15/24 0120 08/15/24 0349 08/15/24 1300 08/15/24 1541  BP: (!) 142/60 (!) 187/56 (!) 157/66 (!) 173/63  Pulse:  77 73 64  Resp: (!) 22 20 19 16   Temp:  98.4 F (36.9 C) 97.7 F (36.5 C) 98.9 F (37.2 C)  TempSrc:  Oral Axillary   SpO2:  99% 97%   Weight:       General exam: Awake, laying in bed, in nad Respiratory system: Normal respiratory effort, no audible wheezing Cardiovascular system: regular rate, s1, s2 Gastrointestinal system: Soft, nondistended, positive BS Central nervous system: not  following commands, strength intact Extremities: Perfused, no clubbing Skin: Normal skin turgor, no notable skin lesions seen Psychiatry: unable to assess given mentation  Data Reviewed:  Labs reviewed: Na 142, K 5.0, Cr 3.58, WBC 3.6, Hgb 10.8, Plts 100  Family Communication: Pt in room, family at bedside  Disposition: Status is: Inpatient Remains inpatient appropriate because: severity of illness  Planned Discharge Destination: Skilled nursing facility    Author: Garnette Pelt, MD 08/15/2024 5:34 PM  For on call review [email or web address].

## 2024-08-15 NOTE — Progress Notes (Signed)
 Patient BP elevated this morning, 181/62.  Morning BP medication given, bladder scanned, 398 ml retained.  Dr Khaliqdina made aware, order received to in and out cath.  600 ml urine via cath removed.  Post BP 177/59

## 2024-08-16 ENCOUNTER — Ambulatory Visit (HOSPITAL_BASED_OUTPATIENT_CLINIC_OR_DEPARTMENT_OTHER)

## 2024-08-16 DIAGNOSIS — E1122 Type 2 diabetes mellitus with diabetic chronic kidney disease: Secondary | ICD-10-CM | POA: Diagnosis not present

## 2024-08-16 DIAGNOSIS — I610 Nontraumatic intracerebral hemorrhage in hemisphere, subcortical: Secondary | ICD-10-CM | POA: Diagnosis not present

## 2024-08-16 DIAGNOSIS — I130 Hypertensive heart and chronic kidney disease with heart failure and stage 1 through stage 4 chronic kidney disease, or unspecified chronic kidney disease: Secondary | ICD-10-CM | POA: Diagnosis not present

## 2024-08-16 DIAGNOSIS — I509 Heart failure, unspecified: Secondary | ICD-10-CM | POA: Diagnosis not present

## 2024-08-16 DIAGNOSIS — I619 Nontraumatic intracerebral hemorrhage, unspecified: Secondary | ICD-10-CM | POA: Diagnosis not present

## 2024-08-16 LAB — GLUCOSE, CAPILLARY
Glucose-Capillary: 202 mg/dL — ABNORMAL HIGH (ref 70–99)
Glucose-Capillary: 233 mg/dL — ABNORMAL HIGH (ref 70–99)
Glucose-Capillary: 224 mg/dL — ABNORMAL HIGH (ref 70–99)
Glucose-Capillary: 242 mg/dL — ABNORMAL HIGH (ref 70–99)
Glucose-Capillary: 219 mg/dL — ABNORMAL HIGH (ref 70–99)
Glucose-Capillary: 224 mg/dL — ABNORMAL HIGH (ref 70–99)

## 2024-08-16 LAB — CBC
HCT: 34.4 % — ABNORMAL LOW (ref 39.0–52.0)
Hemoglobin: 11.3 g/dL — ABNORMAL LOW (ref 13.0–17.0)
MCH: 31 pg (ref 26.0–34.0)
MCHC: 32.8 g/dL (ref 30.0–36.0)
MCV: 94.5 fL (ref 80.0–100.0)
Platelets: 120 K/uL — ABNORMAL LOW (ref 150–400)
RBC: 3.64 MIL/uL — ABNORMAL LOW (ref 4.22–5.81)
RDW: 13.4 % (ref 11.5–15.5)
WBC: 3.6 K/uL — ABNORMAL LOW (ref 4.0–10.5)
nRBC: 0 % (ref 0.0–0.2)

## 2024-08-16 LAB — COMPREHENSIVE METABOLIC PANEL WITH GFR
ALT: 25 U/L (ref 0–44)
AST: 22 U/L (ref 15–41)
Albumin: 3.1 g/dL — ABNORMAL LOW (ref 3.5–5.0)
Alkaline Phosphatase: 71 U/L (ref 38–126)
Anion gap: 11 (ref 5–15)
BUN: 107 mg/dL — ABNORMAL HIGH (ref 8–23)
CO2: 19 mmol/L — ABNORMAL LOW (ref 22–32)
Calcium: 9.3 mg/dL (ref 8.9–10.3)
Chloride: 115 mmol/L — ABNORMAL HIGH (ref 98–111)
Creatinine, Ser: 3.73 mg/dL — ABNORMAL HIGH (ref 0.61–1.24)
GFR, Estimated: 16 mL/min — ABNORMAL LOW (ref >60)
Glucose, Bld: 217 mg/dL — ABNORMAL HIGH (ref 70–99)
Potassium: 5 mmol/L (ref 3.5–5.1)
Sodium: 145 mmol/L (ref 135–145)
Total Bilirubin: 0.9 mg/dL (ref 0.0–1.2)
Total Protein: 7.1 g/dL (ref 6.5–8.1)

## 2024-08-16 LAB — MAGNESIUM: Magnesium: 2.9 mg/dL — ABNORMAL HIGH (ref 1.7–2.4)

## 2024-08-16 MED ORDER — ISOSORBIDE MONONITRATE ER 30 MG PO TB24: 30.0000 mg | ORAL_TABLET | Freq: Every day | ORAL | Status: DC

## 2024-08-16 MED ORDER — CLONIDINE HCL 0.1 MG/24HR TD PTWK
0.1000 mg | MEDICATED_PATCH | TRANSDERMAL | Status: DC
  Administered 2024-08-16: 0.1 mg via TRANSDERMAL
  Filled 2024-08-16: qty 1

## 2024-08-16 MED ORDER — INSULIN GLARGINE-YFGN 100 UNIT/ML ~~LOC~~ SOLN
35.0000 [IU] | Freq: Every day | SUBCUTANEOUS | Status: DC
  Administered 2024-08-17 – 2024-08-20 (×4): 35 [IU] via SUBCUTANEOUS
  Filled 2024-08-16 (×5): qty 0.35

## 2024-08-16 NOTE — Progress Notes (Signed)
 Patient had 6 beats of VT on telemetry, Dr Charlton notified and aware, Per MD, no interventions are needed at this moment, closely monitor patient on telemetry. Plan of care ongoing.   Daril, RN   08/16/24 21:00

## 2024-08-16 NOTE — Progress Notes (Signed)
 Patient had yellow MEWS score due to RR at 24, Dr Charlton notified and aware, no new orders at this point, plan of care ongoing.   Daril, RN   08/16/24 23:14

## 2024-08-16 NOTE — Plan of Care (Signed)
  Problem: Intracerebral Hemorrhage Tissue Perfusion: Goal: Complications of Intracerebral Hemorrhage will be minimized Outcome: Not Progressing   Problem: Coping: Goal: Will verbalize positive feelings about self Outcome: Not Progressing Goal: Will identify appropriate support needs Outcome: Not Progressing   Problem: Health Behavior/Discharge Planning: Goal: Ability to manage health-related needs will improve Outcome: Not Progressing Goal: Goals will be collaboratively established with patient/family Outcome: Not Progressing   Problem: Self-Care: Goal: Ability to participate in self-care as condition permits will improve Outcome: Not Progressing Goal: Verbalization of feelings and concerns over difficulty with self-care will improve Outcome: Not Progressing Goal: Ability to communicate needs accurately will improve Outcome: Not Progressing   Problem: Nutrition: Goal: Risk of aspiration will decrease Outcome: Not Progressing Goal: Dietary intake will improve Outcome: Not Progressing   Problem: Education: Goal: Ability to describe self-care measures that may prevent or decrease complications (Diabetes Survival Skills Education) will improve Outcome: Not Progressing Goal: Individualized Educational Video(s) Outcome: Not Progressing   Problem: Coping: Goal: Ability to adjust to condition or change in health will improve Outcome: Not Progressing   Problem: Fluid Volume: Goal: Ability to maintain a balanced intake and output will improve Outcome: Not Progressing   Problem: Health Behavior/Discharge Planning: Goal: Ability to identify and utilize available resources and services will improve Outcome: Not Progressing Goal: Ability to manage health-related needs will improve Outcome: Not Progressing   Problem: Metabolic: Goal: Ability to maintain appropriate glucose levels will improve Outcome: Not Progressing   Problem: Nutritional: Goal: Maintenance of adequate  nutrition will improve Outcome: Not Progressing Goal: Progress toward achieving an optimal weight will improve Outcome: Not Progressing   Problem: Skin Integrity: Goal: Risk for impaired skin integrity will decrease Outcome: Not Progressing   Problem: Tissue Perfusion: Goal: Adequacy of tissue perfusion will improve Outcome: Not Progressing   Problem: Education: Goal: Knowledge of General Education information will improve Description: Including pain rating scale, medication(s)/side effects and non-pharmacologic comfort measures Outcome: Not Progressing   Problem: Health Behavior/Discharge Planning: Goal: Ability to manage health-related needs will improve Outcome: Not Progressing   Problem: Clinical Measurements: Goal: Ability to maintain clinical measurements within normal limits will improve Outcome: Not Progressing Goal: Will remain free from infection Outcome: Not Progressing Goal: Diagnostic test results will improve Outcome: Not Progressing Goal: Respiratory complications will improve Outcome: Not Progressing Goal: Cardiovascular complication will be avoided Outcome: Not Progressing   Problem: Activity: Goal: Risk for activity intolerance will decrease Outcome: Not Progressing   Problem: Nutrition: Goal: Adequate nutrition will be maintained Outcome: Not Progressing   Problem: Coping: Goal: Level of anxiety will decrease Outcome: Not Progressing   Problem: Elimination: Goal: Will not experience complications related to bowel motility Outcome: Not Progressing Goal: Will not experience complications related to urinary retention Outcome: Not Progressing   Problem: Pain Managment: Goal: General experience of comfort will improve and/or be controlled Outcome: Not Progressing   Problem: Safety: Goal: Ability to remain free from injury will improve Outcome: Not Progressing   Problem: Skin Integrity: Goal: Risk for impaired skin integrity will  decrease Outcome: Not Progressing

## 2024-08-16 NOTE — Progress Notes (Signed)
 Physical Therapy Treatment Patient Details Name: Alec [name] MRN: [id number] DOB: Mar 13, 1946 Today's Date: 08/16/2024   History of Present Illness 78 y.o. male presents to [facility] hospital on 08/10/2024 with R weakness and aphasia. CT head with finding of large L basal ganglia hemorrhage. PMH includes DM, HTN, CAD, CHF, RA, COPD.    PT Comments  Pt received in supine and noted inc restlessness in L UE and LE. Did not note ability to form words today, instead only using groans/mumbles to attempt to communicate. Pt requiring maxA for supine>sit, and mod-maxA posteriorly to maintain sitting balance. Able to participate in reaching tasks today to promote attention and dec R lateral lean, but pt's ability to actively participate dec slowly dec throughout session. Utilized Trendelenberg bed setting as well as mirror therapy to promote dec R lateral pushing, but pt continuing to require both VC and mod-max physical assist to maintain sitting in midline. Noted an improvement in sitting midline following trunk rotation stretch to pt's L. Ultimately decided to forgo STS transfer with [facility] and transfer into chair due to limited cognition and inc restlessness. Recommend post-acute rehab >3hrs/day to improve cognitive deficits, strength, and functional mobility. Acute PT to follow.    If plan is discharge home, recommend the following: Two people to help with walking and/or transfers;Two people to help with bathing/dressing/bathroom;Assistance with feeding;Direct supervision/assist for medications management;Direct supervision/assist for financial management;Assist for transportation;Help with stairs or ramp for entrance;Supervision due to cognitive status   Can travel by private vehicle        Equipment Recommendations  Wheelchair (measurements PT);Wheelchair cushion (measurements PT);Hospital bed;Hoyer lift;BSC/3in1    Recommendations for Other Services Rehab consult     Precautions / Restrictions  Precautions Precautions: Fall;Other (comment) (Cortrak) Recall of Precautions/Restrictions: Impaired Precaution/Restrictions Comments: SBP<160, cortrak, pushes himself to his R Restrictions Weight Bearing Restrictions Per Provider Order: No     Mobility  Bed Mobility Overal bed mobility: Needs Assistance Bed Mobility: Supine to Sit, Sit to Supine     Supine to sit: Max assist, HOB elevated Sit to supine: Max assist, HOB elevated   General bed mobility comments: Pt with R leg already off R EOB upon entering room. Pt needing maxA for trunk and LE management off EOB, as well as for scooting hips to evenly sit at EOB.    Transfers                        Ambulation/Gait                   Stairs             Wheelchair Mobility     Tilt Bed    Modified Rankin (Stroke Patients Only)       Balance Overall balance assessment: Needs assistance Sitting-balance support: Single extremity supported, No upper extremity supported, Feet supported Sitting balance-Leahy Scale: Poor Sitting balance - Comments: Pt pushes self to R side consistently, and this inc as session went on. Even with L UE in lap or with consistent handhold, pt would lean strongly to his R. Pt could not follow VC to correct, nor able to use visual cues of mirror to recognize this lean. Pt would also often need assist ot hold head up to see self in mirror. Postural control: Right lateral lean  Communication Communication Communication: Impaired Factors Affecting Communication: Difficulty expressing self;Reduced clarity of speech  Cognition Arousal: Lethargic, Alert Behavior During Therapy: Flat affect   PT - Cognitive impairments: Awareness, Attention, Initiation, Sequencing, Problem solving, Safety/Judgement                       PT - Cognition Comments: Pt lethargic when supine, but does open eyes to hearing his name [name].  Unable to verbalize clear words today, only noting some groans and mumbling. Pt very inconsistent following one step commands, which dec as session went on. Utilized mirror today, but pt unable to note R lateral lean and unable to correct it without mod-max assist. Following commands: Impaired Following commands impaired: Follows one step commands inconsistently, Follows one step commands with increased time    Cueing Cueing Techniques: Verbal cues, Gestural cues, Visual cues, Tactile cues  Exercises Other Exercises Other Exercises: Trunk rotation to pt's L; PROM +2 maxA; 2 reps 10 sec hold    General Comments General comments (skin integrity, edema, etc.): Pt somewhat restless both in supine and sitting EOB, attempting to reach to grab mirror, therapist, and bed rails. Able to perform reaching tasks to pt's R with L UE at EOB with minA at times, but attention quickly dec.      Pertinent Vitals/Pain Pain Assessment Pain Assessment: Faces Faces Pain Scale: No hurt Pain Intervention(s): Monitored during session    Home Living                          Prior Function            PT Goals (current goals can now be found in the care plan section) Acute Rehab PT Goals Patient Stated Goal: pt unable to state PT Goal Formulation: Patient unable to participate in goal setting Time For Goal Achievement: 08/26/24 Potential to Achieve Goals: Good Progress towards PT goals: Progressing toward goals    Frequency    Min 3X/week      PT Plan      Co-evaluation              AM-PAC PT 6 Clicks Mobility   Outcome Measure  Help needed turning from your back to your side while in a flat bed without using bedrails?: A Lot Help needed moving from lying on your back to sitting on the side of a flat bed without using bedrails?: A Lot Help needed moving to and from a bed to a chair (including a wheelchair)?: Total Help needed standing up from a chair using your arms (e.g.,  wheelchair or bedside chair)?: Total Help needed to walk in hospital room?: Total Help needed climbing 3-5 steps with a railing? : Total 6 Click Score: 8    End of Session Equipment Utilized During Treatment: Other (comment) Statistician) Activity Tolerance: Patient tolerated treatment well;Other (comment) (Limited by cognition) Patient left: in bed;with call bell/phone within reach;with bed alarm set;with family/visitor present Nurse Communication: Mobility status PT Visit Diagnosis: Hemiplegia and hemiparesis;Unsteadiness on feet (R26.81);Other abnormalities of gait and mobility (R26.89);Muscle weakness (generalized) (M62.81);Difficulty in walking, not elsewhere classified (R26.2);Other symptoms and signs involving the nervous system (R29.898) Hemiplegia - Right/Left: Right Hemiplegia - dominant/non-dominant: Dominant Hemiplegia - caused by: Nontraumatic intracerebral hemorrhage     Time: 1226-1259 PT Time Calculation (min) (ACUTE ONLY): 33 min  Charges:    $Therapeutic Activity: 23-37 mins PT General Charges $$ ACUTE PT VISIT: 1 Visit  Mystique Bjelland, SPT    Maysoon Lozada 08/16/2024, 1:35 PM

## 2024-08-16 NOTE — Progress Notes (Signed)
 SLP Cancellation Note  Patient Details Name: Alec [name] MRN: [id number] DOB: [birth year]   Cancelled treatment:       Reason Eval/Treat Not Completed: Fatigue/lethargy limiting ability to participate. RN recommends SLP hold visit today as he is minimally rousing and opening his eyes. Purpose of visit is to assess readiness to repeat instrumental testing, which is not recommended until pt demonstrates more consistent alertness. Will continue following.    Damien Blumenthal, M.A., CCC-SLP Speech Language Pathology, Acute Rehabilitation Services  Secure Chat preferred [phone]  08/16/2024, 5:11 PM

## 2024-08-16 NOTE — TOC Progression Note (Signed)
 Transition of Care [facility]) - Progression Note    Patient Details  Name: Alec [name] MRN: [id number] Date of Birth: 10-Jan-1946  [facility] CM/SW Contact  Andrez JULIANNA George, RN Phone Number: 08/16/2024, 10:39 AM  Clinical Narrative:     Pt continues with cortrak. May need PEG. CIR following but unsure if able to tolerate 3 hours of therapy a day.  IP Care management following.  Expected Discharge Plan: IP Rehab Facility                 Expected Discharge Plan and Services                                               Social Drivers of Health (SDOH) Interventions SDOH Screenings   Food Insecurity: Patient Unable To Answer (08/13/2024)  Housing: Patient Unable To Answer (08/13/2024)  Transportation Needs: Patient Unable To Answer (08/13/2024)  Utilities: Patient Unable To Answer (08/13/2024)  Depression (PHQ2-9): Low Risk  (02/27/2023)  Social Connections: Patient Unable To Answer (08/13/2024)  Tobacco Use: Medium Risk (08/10/2024)    Readmission Risk Interventions    03/17/2023    3:00 PM 03/29/2022    2:24 PM  Readmission Risk Prevention Plan  Transportation Screening Complete Complete  PCP or Specialist Appt within 5-7 Days  Complete  PCP or Specialist Appt within 3-5 Days Complete   Home Care Screening  Complete  Medication Review (RN CM)  Complete  HRI or Home Care Consult Complete   Palliative Care Screening Not Applicable   Medication Review (RN Care Manager) Complete

## 2024-08-16 NOTE — Care Management Important Message (Signed)
 Important Message  Patient Details  Name: Alec [name] MRN: [id number] Date of Birth: 1945-10-17   Important Message Given:  Yes - Medicare IM     Claretta Deed 08/16/2024, 1:45 PM

## 2024-08-16 NOTE — Plan of Care (Signed)
   Problem: Skin Integrity: Goal: Risk for impaired skin integrity will decrease Outcome: Progressing

## 2024-08-16 NOTE — Progress Notes (Signed)
Inpatient Rehab Admissions Coordinator:   Per therapy recommendations, patient was screened for CIR candidacy by Clemens Catholic, MS, CCC-SLP. At this time, Pt. is not at a level to tolerate the intensity of CIR.  Pt. may have potential to progress to becoming a potential CIR candidate, so CIR admissions team will follow and monitor for progress and participation with therapies and place consult order if Pt. appears to be an appropriate candidate. Please contact me with any questions.   Clemens Catholic, [place], [place] Admissions Coordinator  [phone] ([place]) [phone] (office)

## 2024-08-16 NOTE — Plan of Care (Signed)
  Problem: Intracerebral Hemorrhage Tissue Perfusion: Goal: Complications of Intracerebral Hemorrhage will be minimized Outcome: Progressing   Problem: Coping: Goal: Will verbalize positive feelings about self Outcome: Progressing Goal: Will identify appropriate support needs Outcome: Progressing   Problem: Health Behavior/Discharge Planning: Goal: Ability to manage health-related needs will improve Outcome: Progressing Goal: Goals will be collaboratively established with patient/family Outcome: Progressing   Problem: Self-Care: Goal: Ability to participate in self-care as condition permits will improve Outcome: Progressing Goal: Verbalization of feelings and concerns over difficulty with self-care will improve Outcome: Progressing Goal: Ability to communicate needs accurately will improve Outcome: Progressing   Problem: Nutrition: Goal: Risk of aspiration will decrease 08/16/2024 0549 by Vicci Delon PARAS, RN Outcome: Progressing 08/16/2024 0549 by Vicci Delon PARAS, RN Outcome: Progressing Goal: Dietary intake will improve 08/16/2024 0549 by Vicci Delon PARAS, RN Outcome: Progressing 08/16/2024 0549 by Vicci Delon PARAS, RN Outcome: Progressing   Problem: Education: Goal: Ability to describe self-care measures that may prevent or decrease complications (Diabetes Survival Skills Education) will improve Outcome: Progressing Goal: Individualized Educational Video(s) Outcome: Progressing   Problem: Coping: Goal: Ability to adjust to condition or change in health will improve Outcome: Progressing   Problem: Fluid Volume: Goal: Ability to maintain a balanced intake and output will improve Outcome: Progressing   Problem: Health Behavior/Discharge Planning: Goal: Ability to identify and utilize available resources and services will improve Outcome: Progressing Goal: Ability to manage health-related needs will improve Outcome: Progressing   Problem:  Metabolic: Goal: Ability to maintain appropriate glucose levels will improve Outcome: Progressing   Problem: Nutritional: Goal: Maintenance of adequate nutrition will improve Outcome: Progressing Goal: Progress toward achieving an optimal weight will improve Outcome: Progressing   Problem: Skin Integrity: Goal: Risk for impaired skin integrity will decrease 08/16/2024 0549 by Vicci Delon PARAS, RN Outcome: Progressing 08/16/2024 0549 by Vicci Delon PARAS, RN Outcome: Progressing   Problem: Tissue Perfusion: Goal: Adequacy of tissue perfusion will improve Outcome: Progressing   Problem: Education: Goal: Knowledge of General Education information will improve Description: Including pain rating scale, medication(s)/side effects and non-pharmacologic comfort measures Outcome: Progressing   Problem: Health Behavior/Discharge Planning: Goal: Ability to manage health-related needs will improve Outcome: Progressing   Problem: Clinical Measurements: Goal: Ability to maintain clinical measurements within normal limits will improve Outcome: Progressing Goal: Will remain free from infection Outcome: Progressing Goal: Diagnostic test results will improve Outcome: Progressing Goal: Respiratory complications will improve Outcome: Progressing Goal: Cardiovascular complication will be avoided Outcome: Progressing   Problem: Activity: Goal: Risk for activity intolerance will decrease Outcome: Progressing   Problem: Nutrition: Goal: Adequate nutrition will be maintained Outcome: Progressing   Problem: Coping: Goal: Level of anxiety will decrease Outcome: Progressing   Problem: Elimination: Goal: Will not experience complications related to bowel motility Outcome: Progressing Goal: Will not experience complications related to urinary retention Outcome: Progressing   Problem: Pain Managment: Goal: General experience of comfort will improve and/or be controlled 08/16/2024  0549 by Vicci Delon PARAS, RN Outcome: Progressing 08/16/2024 0549 by Vicci Delon PARAS, RN Outcome: Progressing   Problem: Safety: Goal: Ability to remain free from injury will improve Outcome: Progressing   Problem: Skin Integrity: Goal: Risk for impaired skin integrity will decrease Outcome: Progressing

## 2024-08-16 NOTE — Progress Notes (Signed)
 STROKE TEAM PROGRESS NOTE    SIGNIFICANT HOSPITAL EVENTS 11/11: Patient admitted with left subcortical ICH  INTERIM HISTORY/SUBJECTIVE Patient   is lying comfortably in bed.  His wife is at the bedside.  Patient is less agitated today.  Can be aroused and follows some commands but not consistently.  Speech remains dysarthric.  Dense  right hemiplegia and able to move left side purposefully against gravity  OBJECTIVE  CBC    Component Value Date/Time   WBC 3.6 (L) 08/16/2024 0206   RBC 3.64 (L) 08/16/2024 0206   HGB 11.3 (L) 08/16/2024 0206   HGB 12.1 (L) 10/26/2021 0902   HCT 34.4 (L) 08/16/2024 0206   HCT 33.7 (L) 10/26/2021 0902   PLT 120 (L) 08/16/2024 0206   PLT 128 (L) 10/26/2021 0902   MCV 94.5 08/16/2024 0206   MCV 92 10/26/2021 0902   MCH 31.0 08/16/2024 0206   MCHC 32.8 08/16/2024 0206   RDW 13.4 08/16/2024 0206   RDW 13.6 10/26/2021 0902   LYMPHSABS 0.8 08/10/2024 1901   LYMPHSABS 1.5 10/26/2021 0902   MONOABS 0.3 08/10/2024 1901   EOSABS 0.0 08/10/2024 1901   EOSABS 0.1 10/26/2021 0902   BASOSABS 0.0 08/10/2024 1901   BASOSABS 0.1 10/26/2021 0902    BMET    Component Value Date/Time   NA 145 08/16/2024 0206   NA 141 04/04/2022 1232   K 5.0 08/16/2024 0206   CL 115 (H) 08/16/2024 0206   CO2 19 (L) 08/16/2024 0206   GLUCOSE 217 (H) 08/16/2024 0206   BUN 107 (H) 08/16/2024 0206   BUN 63 (H) 04/04/2022 1232   CREATININE 3.73 (H) 08/16/2024 0206   CREATININE 3.61 (H) 01/15/2024 0840   CALCIUM  9.3 08/16/2024 0206   EGFR 16.0 04/30/2024 0917   EGFR 17 (L) 01/15/2024 0840   EGFR 17 (L) 04/04/2022 1232   GFRNONAA 16 (L) 08/16/2024 0206    IMAGING past 24 hours No results found.   Vitals:   08/16/24 0500 08/16/24 0534 08/16/24 0725 08/16/24 1125  BP:   (!) 187/78 128/69  Pulse:  73 80 79  Resp:  (!) 22 20 18   Temp:  98.4 F (36.9 C) 98 F (36.7 C) 98.6 F (37 C)  TempSrc:  Oral  Oral  SpO2:  96% 99% 98%  Weight: 88.9 kg        PHYSICAL  EXAM General:   well-nourished, well-developed elderly patient in no acute distress Psych:  Mood and affect appropriate for situation CV: Regular rate and rhythm on monitor Respiratory:  Regular, unlabored respirations on room air   NEURO:  Mental Status: Patient is drowsy but can be aroused.  Patient is able to state his name his age and the city he is not. Follows commands on the left  Speech/Language: speech is with severe dysarthria and impaired naming but intact repetition  Cranial Nerves:  II: PERRL.   III, IV, VI: Left gaze preference but able to cross midline VII: Right facial droop VIII: hearing intact to voice. IX, X: Voice is very dysarthric XII: tongue is midline Motor: Moves left upper extremity and left lower extremity with antigravity strength, flicker of movement right upper extremity to noxious, able to follow commands with right lower extremity Tone: is normal and bulk is normal Sensation- Intact to light touch bilaterally.  Coordination: FTN intact on the left, unable to perform on the right Gait- deferred  ASSESSMENT/PLAN  [name] is a 78 y.o. male with history of diabetes, hypertension,  CAD, CHF, rheumatoid arthritis and COPD admitted for right-sided weakness and aphasia.  He was found to have a left subcortical ICH.    ICH: Left BG ICH, etiology: Likely hypertensive Code Stroke CT head 11 mm acute IPH in left basal ganglia with no IVH Follow-up CT 08/11/02/2023 interval increase in size of left basal ganglia hemorrhage now at 17 mL with mild increase in surrounding edema without midline shift Follow-up CT 11/12 at 1220 IPH within the left basal ganglia with mild surrounding edema and midline shift measuring 4 mm, stable from prior study MRI left basal ganglia IPH similar to prior exam with mild rightward shift of midline structures by 4 mm  MRA no LVO or significant stenosis 11/14 - CT Head repeat-stable hematoma and midline shift Carotid ultrasound  unremarkable 2D Echo EF 55-60%, moderate eccentric left ventricular hypertrophy  LDL 43 HgbA1c 6.0 UDS neg VTE prophylaxis - heparin  subq aspirin  81 mg daily prior to admission, now on No antithrombotic secondary to IPH Therapy recommendations:  CIR Disposition: Pending  Hypertension Home meds: Losartan 5 mg daily, hydralazine  100 mg 3 times daily, benica 5mg  and lasix  BP stable on the high end Amlodipine  10mg , Hydralazine  100mg  TID, add coreg  6.25->12.5 mg BP goal < 160 Long term BP goal normotensive  Hyperlipidemia Home meds: Atorvastatin  40 mg daily LDL 43, goal < 70 Resume statin  Continue statin at discharge  Diabetes type II Controlled Home meds: Insulin  aspart via insulin  pump  HgbA1c 6.0, goal < 7.0 Hyperglycemia CBGs SSI Increase basal insulin  from 10 ->15->20u Recommend close follow-up with PCP for better DM control  CKD 4 Cre 4.01->3.72- 3.54 - 3.38--3.47 On TF BMP monitoring Hold off lasix , ARB  Dysphagia Patient has post-stroke dysphagia, SLP consulted NPO, did not pass swallow Core track placed  On TF @ 55  Other Stroke Risk Factors Advanced age Obesity, Body mass index is 31.63 kg/m., BMI >/= 30 associated with increased stroke risk, recommend weight loss, diet and exercise as appropriate  Coronary artery disease Congestive heart failure  Other Active Problems RA COPD Thrombocytopenia platelet 91->88->93 - 81--84 Mild leukopenia WBC 3.3-4.9 - 3.8--3.9 Restless throughout the night  Seroquel 25 mg at bedtime daily Delirium precautions  Hospital day # 6    Wife at the bedside. Pt drowsy but open eyes on voice.neurological exam is unchanged and still has severe dysarthria with intangible words, but able to follow simple commands on the L hand. Still has severe R hemiplegia but mild withdraw to pain.      Therapist recommend CIR but they think patient is not at the level at present time to participate with therapies and they will continue to  monitor and look for improvement.. Discussed with Dr. Cindy Stroke team will sign off.  Follow-up as an outpatient stroke clinic with nurse practitioner in 2 months.  Kindly call for questions Eather Popp, MD      To contact Stroke Continuity provider, please refer to [email or web address]. After hours, contact General Neurology

## 2024-08-16 NOTE — Progress Notes (Signed)
  Progress Note   Patient: Alec [name] FMW:[id number] DOB: 27-Feb-1946 DOA: 08/10/2024     6 DOS: the patient was seen and examined on 08/16/2024   Brief hospital course: 78yo with hx DM, HTN, CAD, CHF, RA, COPD who initially presented with R sided weakness and aphasia, found to have L sided subcortical ICH  Assessment and Plan: ICH -Noted on CT and MRI -likely secondary to HTN -was on ASA PTA, now on hold -Neurology following. Will cont to manage HTN  HTN -BP remains labile and suboptimally controlled -Currently on norvasc  10mg , Coreg  25mg  BID, Hydralazine  100mg  q8h, -bp remains suboptimal -added clonidine  patch 0.1mg   HLD -cont statin -LDL 43  DM2, uncontrolled with hyperglycemia -glucose trends in the mid-200's -Noted to have been on 100u/day per insulin  pump PTA -Will increase semglee  to 35 u. Cont to titrate insulin  with goal of euglycemia  CKD4 -Cr appears to be stable at this time -Bladder outlet obstruction noted as below -Recheck bmet in AM  Bladder outlet obstruction ->400cc noted on bladder scan, requiring I/o cath -If pt continues needing I/O cath over 24h, then would plan indwelling foley at that time  Dysphagia -Currently coretrak dependent -Not following commands at this time -If pt is unable to wean off coretrak, then may need PEG  COPD -on minimal O2 support -No audible wheezing     Subjective: difficult to assess given mentation  Physical Exam: Vitals:   08/16/24 0500 08/16/24 0534 08/16/24 0725 08/16/24 1125  BP:   (!) 187/78 128/69  Pulse:  73 80 79  Resp:  (!) 22 20 18   Temp:  98.4 F (36.9 C) 98 F (36.7 C) 98.6 F (37 C)  TempSrc:  Oral  Oral  SpO2:  96% 99% 98%  Weight: 88.9 kg      General exam: laying in bed, in no acute distress Respiratory system: normal chest rise, clear, no audible wheezing Cardiovascular system: regular rhythm, s1-s2 Gastrointestinal system: Nondistended, nontender, pos BS Central nervous system:  No seizures, no tremors Extremities: No cyanosis, no joint deformities Skin: No rashes, no pallor Psychiatry: Unable to assess given mentation  Data Reviewed:  Labs reviewed: Na 145, K 5.0, Cr 3.73, WBC 3.6, Hgb 11.3, Plts 120   Family Communication: Pt in room, family at bedside  Disposition: Status is: Inpatient Remains inpatient appropriate because: severity of illness  Planned Discharge Destination: Skilled nursing facility    Author: Garnette Pelt, MD 08/16/2024 4:05 PM  For on call review [email or web address].

## 2024-08-17 ENCOUNTER — Inpatient Hospital Stay (HOSPITAL_COMMUNITY)

## 2024-08-17 DIAGNOSIS — N281 Cyst of kidney, acquired: Secondary | ICD-10-CM | POA: Diagnosis not present

## 2024-08-17 DIAGNOSIS — R29818 Other symptoms and signs involving the nervous system: Secondary | ICD-10-CM | POA: Diagnosis not present

## 2024-08-17 DIAGNOSIS — R569 Unspecified convulsions: Secondary | ICD-10-CM

## 2024-08-17 DIAGNOSIS — I610 Nontraumatic intracerebral hemorrhage in hemisphere, subcortical: Secondary | ICD-10-CM | POA: Diagnosis not present

## 2024-08-17 DIAGNOSIS — I619 Nontraumatic intracerebral hemorrhage, unspecified: Secondary | ICD-10-CM | POA: Diagnosis not present

## 2024-08-17 DIAGNOSIS — N19 Unspecified kidney failure: Secondary | ICD-10-CM | POA: Diagnosis not present

## 2024-08-17 LAB — COMPREHENSIVE METABOLIC PANEL WITH GFR
ALT: 40 U/L (ref 0–44)
AST: 37 U/L (ref 15–41)
Albumin: 3 g/dL — ABNORMAL LOW (ref 3.5–5.0)
Alkaline Phosphatase: 82 U/L (ref 38–126)
Anion gap: 12 (ref 5–15)
BUN: 141 mg/dL — ABNORMAL HIGH (ref 8–23)
CO2: 21 mmol/L — ABNORMAL LOW (ref 22–32)
Calcium: 9 mg/dL (ref 8.9–10.3)
Chloride: 116 mmol/L — ABNORMAL HIGH (ref 98–111)
Creatinine, Ser: 4.59 mg/dL — ABNORMAL HIGH (ref 0.61–1.24)
GFR, Estimated: 13 mL/min — ABNORMAL LOW (ref >60)
Glucose, Bld: 287 mg/dL — ABNORMAL HIGH (ref 70–99)
Potassium: 6.2 mmol/L — ABNORMAL HIGH (ref 3.5–5.1)
Sodium: 149 mmol/L — ABNORMAL HIGH (ref 135–145)
Total Bilirubin: 0.8 mg/dL (ref 0.0–1.2)
Total Protein: 7.2 g/dL (ref 6.5–8.1)

## 2024-08-17 LAB — POCT I-STAT 7, (LYTES, BLD GAS, ICA,H+H)
Acid-base deficit: 2 mmol/L (ref 0.0–2.0)
Bicarbonate: 23.1 mmol/L (ref 20.0–28.0)
Calcium, Ion: 1.22 mmol/L (ref 1.15–1.40)
HCT: 29 % — ABNORMAL LOW (ref 39.0–52.0)
Hemoglobin: 9.9 g/dL — ABNORMAL LOW (ref 13.0–17.0)
O2 Saturation: 100 %
Potassium: 5.2 mmol/L — ABNORMAL HIGH (ref 3.5–5.1)
Sodium: 150 mmol/L — ABNORMAL HIGH (ref 135–145)
TCO2: 24 mmol/L (ref 22–32)
pCO2 arterial: 38.9 mmHg (ref 32–48)
pH, Arterial: 7.382 (ref 7.35–7.45)
pO2, Arterial: 351 mmHg — ABNORMAL HIGH (ref 83–108)

## 2024-08-17 LAB — GLUCOSE, CAPILLARY
Glucose-Capillary: 116 mg/dL — ABNORMAL HIGH (ref 70–99)
Glucose-Capillary: 146 mg/dL — ABNORMAL HIGH (ref 70–99)
Glucose-Capillary: 174 mg/dL — ABNORMAL HIGH (ref 70–99)
Glucose-Capillary: 178 mg/dL — ABNORMAL HIGH (ref 70–99)
Glucose-Capillary: 207 mg/dL — ABNORMAL HIGH (ref 70–99)
Glucose-Capillary: 217 mg/dL — ABNORMAL HIGH (ref 70–99)

## 2024-08-17 LAB — CBC
HCT: 35.6 % — ABNORMAL LOW (ref 39.0–52.0)
HCT: 32.6 % — ABNORMAL LOW (ref 39.0–52.0)
Hemoglobin: 11.5 g/dL — ABNORMAL LOW (ref 13.0–17.0)
Hemoglobin: 10.5 g/dL — ABNORMAL LOW (ref 13.0–17.0)
MCH: 31 pg (ref 26.0–34.0)
MCH: 31 pg (ref 26.0–34.0)
MCHC: 32.3 g/dL (ref 30.0–36.0)
MCHC: 32.2 g/dL (ref 30.0–36.0)
MCV: 96 fL (ref 80.0–100.0)
MCV: 96.2 fL (ref 80.0–100.0)
Platelets: 125 K/uL — ABNORMAL LOW (ref 150–400)
Platelets: 114 K/uL — ABNORMAL LOW (ref 150–400)
RBC: 3.71 MIL/uL — ABNORMAL LOW (ref 4.22–5.81)
RBC: 3.39 MIL/uL — ABNORMAL LOW (ref 4.22–5.81)
RDW: 13.1 % (ref 11.5–15.5)
RDW: 13.1 % (ref 11.5–15.5)
WBC: 5 K/uL (ref 4.0–10.5)
WBC: 3.1 K/uL — ABNORMAL LOW (ref 4.0–10.5)
nRBC: 0 % (ref 0.0–0.2)
nRBC: 0 % (ref 0.0–0.2)

## 2024-08-17 LAB — BLOOD GAS, ARTERIAL
Acid-base deficit: 2.9 mmol/L — ABNORMAL HIGH (ref 0.0–2.0)
Bicarbonate: 22 mmol/L (ref 20.0–28.0)
Drawn by: 54881
O2 Saturation: 99.3 %
Patient temperature: 37
pCO2 arterial: 38 mmHg (ref 32–48)
pH, Arterial: 7.37 (ref 7.35–7.45)
pO2, Arterial: 86 mmHg (ref 83–108)

## 2024-08-17 LAB — BASIC METABOLIC PANEL WITH GFR
Anion gap: 9 (ref 5–15)
BUN: 143 mg/dL — ABNORMAL HIGH (ref 8–23)
CO2: 24 mmol/L (ref 22–32)
Calcium: 8.6 mg/dL — ABNORMAL LOW (ref 8.9–10.3)
Chloride: 116 mmol/L — ABNORMAL HIGH (ref 98–111)
Creatinine, Ser: 4.57 mg/dL — ABNORMAL HIGH (ref 0.61–1.24)
GFR, Estimated: 13 mL/min — ABNORMAL LOW (ref >60)
Glucose, Bld: 245 mg/dL — ABNORMAL HIGH (ref 70–99)
Potassium: 5.3 mmol/L — ABNORMAL HIGH (ref 3.5–5.1)
Sodium: 149 mmol/L — ABNORMAL HIGH (ref 135–145)

## 2024-08-17 LAB — PROTIME-INR
INR: 1.2 (ref 0.8–1.2)
Prothrombin Time: 15.8 s — ABNORMAL HIGH (ref 11.4–15.2)

## 2024-08-17 LAB — POCT I-STAT, CHEM 8
BUN: 14 mg/dL (ref 8–23)
Calcium, Ion: 0.85 mmol/L — CL (ref 1.15–1.40)
Chloride: 116 mmol/L — ABNORMAL HIGH (ref 98–111)
Creatinine, Ser: 0.7 mg/dL (ref 0.61–1.24)
Glucose, Bld: 81 mg/dL (ref 70–99)
HCT: 29 % — ABNORMAL LOW (ref 39.0–52.0)
Hemoglobin: 9.9 g/dL — ABNORMAL LOW (ref 13.0–17.0)
Potassium: 2.6 mmol/L — CL (ref 3.5–5.1)
Sodium: 146 mmol/L — ABNORMAL HIGH (ref 135–145)
TCO2: 19 mmol/L — ABNORMAL LOW (ref 22–32)

## 2024-08-17 LAB — LACTIC ACID, PLASMA: Lactic Acid, Venous: 1.2 mmol/L (ref 0.5–1.9)

## 2024-08-17 MED ORDER — ROCURONIUM BROMIDE 10 MG/ML (PF) SYRINGE
PREFILLED_SYRINGE | INTRAVENOUS | Status: AC
  Administered 2024-08-17: 90 mg via INTRAVENOUS
  Filled 2024-08-17: qty 10

## 2024-08-17 MED ORDER — SODIUM CHLORIDE 0.9 % IV SOLN
250.0000 mL | INTRAVENOUS | Status: AC
  Administered 2024-08-17: 250 mL via INTRAVENOUS

## 2024-08-17 MED ORDER — FENTANYL CITRATE (PF) 50 MCG/ML IJ SOSY
50.0000 ug | PREFILLED_SYRINGE | INTRAMUSCULAR | Status: DC | PRN
  Administered 2024-08-17 – 2024-08-19 (×7): 50 ug via INTRAVENOUS
  Administered 2024-08-20 (×2): 100 ug via INTRAVENOUS
  Filled 2024-08-17 (×2): qty 1
  Filled 2024-08-17: qty 2
  Filled 2024-08-17 (×4): qty 1
  Filled 2024-08-17: qty 2
  Filled 2024-08-17 (×2): qty 1

## 2024-08-17 MED ORDER — NOREPINEPHRINE 4 MG/250ML-% IV SOLN
0.0000 ug/min | INTRAVENOUS | Status: DC
Start: 2024-08-17 — End: 2024-08-20
  Administered 2024-08-17: 2 ug/min via INTRAVENOUS
  Filled 2024-08-17 (×3): qty 250

## 2024-08-17 MED ORDER — DEXTROSE 50 % IV SOLN
INTRAVENOUS | Status: AC
  Administered 2024-08-17: 50 mL via INTRAVENOUS
  Filled 2024-08-17: qty 50

## 2024-08-17 MED ORDER — INSULIN ASPART 100 UNIT/ML IJ SOLN
0.0000 [IU] | INTRAMUSCULAR | Status: DC
  Administered 2024-08-17: 7 [IU] via SUBCUTANEOUS
  Administered 2024-08-17: 3 [IU] via SUBCUTANEOUS
  Administered 2024-08-17 – 2024-08-18 (×2): 4 [IU] via SUBCUTANEOUS
  Administered 2024-08-18 – 2024-08-19 (×5): 3 [IU] via SUBCUTANEOUS
  Administered 2024-08-19: 4 [IU] via SUBCUTANEOUS
  Administered 2024-08-19: 3 [IU] via SUBCUTANEOUS
  Administered 2024-08-19 (×3): 4 [IU] via SUBCUTANEOUS
  Administered 2024-08-19: 3 [IU] via SUBCUTANEOUS
  Administered 2024-08-20 (×6): 4 [IU] via SUBCUTANEOUS
  Administered 2024-08-21 (×2): 7 [IU] via SUBCUTANEOUS
  Administered 2024-08-21 – 2024-08-22 (×4): 4 [IU] via SUBCUTANEOUS
  Administered 2024-08-22: 3 [IU] via SUBCUTANEOUS
  Administered 2024-08-22: 4 [IU] via SUBCUTANEOUS
  Administered 2024-08-23 – 2024-08-24 (×4): 3 [IU] via SUBCUTANEOUS
  Administered 2024-08-24: 7 [IU] via SUBCUTANEOUS
  Administered 2024-08-24: 4 [IU] via SUBCUTANEOUS
  Administered 2024-08-25 (×2): 7 [IU] via SUBCUTANEOUS
  Administered 2024-08-25: 3 [IU] via SUBCUTANEOUS
  Administered 2024-08-26: 15 [IU] via SUBCUTANEOUS
  Administered 2024-08-26: 4 [IU] via SUBCUTANEOUS
  Administered 2024-08-26: 3 [IU] via SUBCUTANEOUS
  Administered 2024-08-26: 15 [IU] via SUBCUTANEOUS
  Administered 2024-08-27: 4 [IU] via SUBCUTANEOUS
  Administered 2024-08-27: 11 [IU] via SUBCUTANEOUS
  Administered 2024-08-27: 4 [IU] via SUBCUTANEOUS
  Administered 2024-08-27: 7 [IU] via SUBCUTANEOUS
  Administered 2024-08-27: 3 [IU] via SUBCUTANEOUS
  Administered 2024-08-28 (×2): 4 [IU] via SUBCUTANEOUS
  Administered 2024-08-28: 7 [IU] via SUBCUTANEOUS
  Administered 2024-08-28: 4 [IU] via SUBCUTANEOUS
  Administered 2024-08-29: 3 [IU] via SUBCUTANEOUS
  Administered 2024-08-29 (×2): 4 [IU] via SUBCUTANEOUS
  Administered 2024-08-30: 11 [IU] via SUBCUTANEOUS
  Administered 2024-08-30: 15 [IU] via SUBCUTANEOUS
  Administered 2024-08-30: 11 [IU] via SUBCUTANEOUS
  Administered 2024-08-30: 4 [IU] via SUBCUTANEOUS
  Administered 2024-08-31: 3 [IU] via SUBCUTANEOUS
  Administered 2024-08-31: 4 [IU] via SUBCUTANEOUS
  Filled 2024-08-17: qty 4
  Filled 2024-08-17: qty 2
  Filled 2024-08-17: qty 3
  Filled 2024-08-17: qty 5
  Filled 2024-08-17 (×4): qty 4
  Filled 2024-08-17 (×2): qty 3
  Filled 2024-08-17: qty 2
  Filled 2024-08-17 (×3): qty 4
  Filled 2024-08-17: qty 2
  Filled 2024-08-17: qty 3
  Filled 2024-08-17: qty 15
  Filled 2024-08-17: qty 4
  Filled 2024-08-17: qty 3
  Filled 2024-08-17: qty 4
  Filled 2024-08-17: qty 15
  Filled 2024-08-17: qty 11
  Filled 2024-08-17 (×2): qty 3
  Filled 2024-08-17: qty 4
  Filled 2024-08-17: qty 7
  Filled 2024-08-17: qty 2
  Filled 2024-08-17: qty 4
  Filled 2024-08-17 (×2): qty 7
  Filled 2024-08-17: qty 3
  Filled 2024-08-17: qty 4
  Filled 2024-08-17 (×2): qty 3
  Filled 2024-08-17 (×2): qty 4
  Filled 2024-08-17: qty 5
  Filled 2024-08-17 (×2): qty 4
  Filled 2024-08-17: qty 2
  Filled 2024-08-17: qty 4
  Filled 2024-08-17: qty 11
  Filled 2024-08-17: qty 6
  Filled 2024-08-17: qty 4
  Filled 2024-08-17 (×3): qty 3
  Filled 2024-08-17: qty 4
  Filled 2024-08-17: qty 3
  Filled 2024-08-17: qty 7
  Filled 2024-08-17 (×2): qty 4
  Filled 2024-08-17: qty 3
  Filled 2024-08-17: qty 7
  Filled 2024-08-17: qty 5
  Filled 2024-08-17: qty 4

## 2024-08-17 MED ORDER — SUCCINYLCHOLINE CHLORIDE 200 MG/10ML IV SOSY
PREFILLED_SYRINGE | INTRAVENOUS | Status: AC
  Filled 2024-08-17: qty 10

## 2024-08-17 MED ORDER — MIDAZOLAM HCL (PF) 2 MG/2ML IJ SOLN: 1.0000 mg | Freq: Once | INTRAMUSCULAR | Status: AC

## 2024-08-17 MED ORDER — DOCUSATE SODIUM 50 MG/5ML PO LIQD
100.0000 mg | Freq: Two times a day (BID) | ORAL | Status: DC
  Administered 2024-08-18 – 2024-08-21 (×2): 100 mg
  Filled 2024-08-17 (×5): qty 10

## 2024-08-17 MED ORDER — DEXTROSE 50 % IV SOLN: 1.0000 | Freq: Once | INTRAVENOUS | Status: AC

## 2024-08-17 MED ORDER — PHENYLEPHRINE 80 MCG/ML (10ML) SYRINGE FOR IV PUSH (FOR BLOOD PRESSURE SUPPORT)
PREFILLED_SYRINGE | INTRAVENOUS | Status: AC
  Administered 2024-08-17: 160 ug via INTRAVENOUS
  Filled 2024-08-17: qty 10

## 2024-08-17 MED ORDER — ALBUTEROL SULFATE (2.5 MG/3ML) 0.083% IN NEBU
INHALATION_SOLUTION | RESPIRATORY_TRACT | Status: AC
  Filled 2024-08-17: qty 12

## 2024-08-17 MED ORDER — SODIUM CHLORIDE 0.45 % IV SOLN: INTRAVENOUS | Status: DC

## 2024-08-17 MED ORDER — INSULIN ASPART 100 UNIT/ML IV SOLN
10.0000 [IU] | Freq: Once | INTRAVENOUS | Status: AC
  Administered 2024-08-17: 10 [IU] via INTRAVENOUS

## 2024-08-17 MED ORDER — ARFORMOTEROL TARTRATE 15 MCG/2ML IN NEBU
15.0000 ug | INHALATION_SOLUTION | Freq: Two times a day (BID) | RESPIRATORY_TRACT | Status: DC
  Administered 2024-08-17 – 2024-08-28 (×23): 15 ug via RESPIRATORY_TRACT
  Filled 2024-08-17 (×24): qty 2

## 2024-08-17 MED ORDER — PHENYLEPHRINE 80 MCG/ML (10ML) SYRINGE FOR IV PUSH (FOR BLOOD PRESSURE SUPPORT)
160.0000 ug | PREFILLED_SYRINGE | Freq: Once | INTRAVENOUS | Status: AC | PRN
  Administered 2024-08-17 (×2): 160 ug via INTRAVENOUS

## 2024-08-17 MED ORDER — ROCURONIUM BROMIDE 10 MG/ML (PF) SYRINGE: 90.0000 mg | PREFILLED_SYRINGE | Freq: Once | INTRAVENOUS | Status: AC

## 2024-08-17 MED ORDER — MIDAZOLAM HCL 2 MG/2ML IJ SOLN
INTRAMUSCULAR | Status: AC
  Administered 2024-08-17: 1 mg via INTRAVENOUS
  Filled 2024-08-17: qty 2

## 2024-08-17 MED ORDER — FENTANYL CITRATE (PF) 50 MCG/ML IJ SOSY: 50.0000 ug | PREFILLED_SYRINGE | Freq: Once | INTRAMUSCULAR | Status: AC

## 2024-08-17 MED ORDER — ALBUTEROL SULFATE (2.5 MG/3ML) 0.083% IN NEBU
10.0000 mg | INHALATION_SOLUTION | Freq: Once | RESPIRATORY_TRACT | Status: AC
  Administered 2024-08-17: 10 mg via RESPIRATORY_TRACT

## 2024-08-17 MED ORDER — ETOMIDATE 2 MG/ML IV SOLN: 20.0000 mg | Freq: Once | INTRAVENOUS | Status: AC

## 2024-08-17 MED ORDER — POLYETHYLENE GLYCOL 3350 17 G PO PACK
17.0000 g | PACK | Freq: Every day | ORAL | Status: DC
  Filled 2024-08-17 (×2): qty 1

## 2024-08-17 MED ORDER — IPRATROPIUM-ALBUTEROL 0.5-2.5 (3) MG/3ML IN SOLN
3.0000 mL | RESPIRATORY_TRACT | Status: DC | PRN
  Administered 2024-08-17: 3 mL via RESPIRATORY_TRACT
  Filled 2024-08-17: qty 3

## 2024-08-17 MED ORDER — BUDESONIDE 0.25 MG/2ML IN SUSP
0.2500 mg | Freq: Two times a day (BID) | RESPIRATORY_TRACT | Status: DC
  Administered 2024-08-17 – 2024-08-28 (×23): 0.25 mg via RESPIRATORY_TRACT
  Filled 2024-08-17 (×24): qty 2

## 2024-08-17 MED ORDER — FENTANYL CITRATE (PF) 50 MCG/ML IJ SOSY
50.0000 ug | PREFILLED_SYRINGE | INTRAMUSCULAR | Status: AC | PRN
  Administered 2024-08-17 – 2024-08-18 (×3): 50 ug via INTRAVENOUS
  Filled 2024-08-17 (×2): qty 1

## 2024-08-17 MED ORDER — ORAL CARE MOUTH RINSE
15.0000 mL | OROMUCOSAL | Status: DC
  Administered 2024-08-17 – 2024-08-20 (×36): 15 mL via OROMUCOSAL

## 2024-08-17 MED ORDER — LACTATED RINGERS IV BOLUS
1000.0000 mL | Freq: Once | INTRAVENOUS | Status: AC
  Administered 2024-08-17: 1000 mL via INTRAVENOUS

## 2024-08-17 MED ORDER — SODIUM BICARBONATE 8.4 % IV SOLN
INTRAVENOUS | Status: AC
  Administered 2024-08-17: 50 meq via INTRAVENOUS
  Filled 2024-08-17: qty 50

## 2024-08-17 MED ORDER — ORAL CARE MOUTH RINSE: 15.0000 mL | OROMUCOSAL | Status: DC | PRN

## 2024-08-17 MED ORDER — FENTANYL CITRATE (PF) 50 MCG/ML IJ SOSY
PREFILLED_SYRINGE | INTRAMUSCULAR | Status: AC
  Administered 2024-08-17: 50 ug via INTRAVENOUS
  Filled 2024-08-17: qty 2

## 2024-08-17 MED ORDER — SODIUM BICARBONATE 8.4 % IV SOLN: 50.0000 meq | Freq: Once | INTRAVENOUS | Status: AC

## 2024-08-17 MED ORDER — PIPERACILLIN-TAZOBACTAM IN DEX 2-0.25 GM/50ML IV SOLN
2.2500 g | Freq: Four times a day (QID) | INTRAVENOUS | Status: DC
  Administered 2024-08-17 – 2024-08-18 (×3): 2.25 g via INTRAVENOUS
  Filled 2024-08-17 (×5): qty 50

## 2024-08-17 MED ORDER — ETOMIDATE 2 MG/ML IV SOLN
INTRAVENOUS | Status: AC
  Administered 2024-08-17: 20 mg via INTRAVENOUS
  Filled 2024-08-17: qty 20

## 2024-08-17 MED ORDER — DEXMEDETOMIDINE HCL IN NACL 400 MCG/100ML IV SOLN
0.0000 ug/kg/h | INTRAVENOUS | Status: DC
Start: 2024-08-17 — End: 2024-08-21
  Administered 2024-08-17: 0.7 ug/kg/h via INTRAVENOUS
  Administered 2024-08-17: 0.4 ug/kg/h via INTRAVENOUS
  Administered 2024-08-18: 0.6 ug/kg/h via INTRAVENOUS
  Administered 2024-08-19: 0.4 ug/kg/h via INTRAVENOUS
  Administered 2024-08-19: 0.5 ug/kg/h via INTRAVENOUS
  Administered 2024-08-20: 0.7 ug/kg/h via INTRAVENOUS
  Administered 2024-08-20: 0.4 ug/kg/h via INTRAVENOUS
  Administered 2024-08-21: 0.6 ug/kg/h via INTRAVENOUS
  Filled 2024-08-17 (×8): qty 100

## 2024-08-17 NOTE — Progress Notes (Signed)
   08/17/24 1113  Airway 8 mm  Placement Date/Time: 08/17/24 1047   Placed By: ICU physician  Airway Device: Endotracheal Tube  Laryngoscope Blade: MAC;4  ETT Types: Endobronchial  Size (mm): 8 mm  Cuffed: Cuffed  Insertion attempts: 1  Airway Equipment: Stylet;Video Laryngoscope  ...  Secured at (cm) (S)  25 cm (pulled back per CCM MD order)    No complications noted.

## 2024-08-17 NOTE — Progress Notes (Addendum)
 Progress Note   Patient: Alec [name] FMW:[id number] DOB: July 19, 1946 DOA: 08/10/2024     7 DOS: the patient was seen and examined on 08/17/2024   Brief hospital course: 78yo with hx DM, HTN, CAD, CHF, RA, COPD who initially presented with R sided weakness and aphasia, found to have L sided subcortical ICH  Assessment and Plan: Cerebral edema  ICH -Noted on CT and MRI -likely secondary to HTN -was on ASA PTA, now on hold -on 11/18, note to have increased resp effort with poor air movement. Pt difficult to arouse.  -CXR reviewed. Hypoinflation, clear -Ordered and reviewed ABG, later found to be unremarkable -Given acute change and respiratory compromise, consulted PCCM.  -PCCM recs for intubation for protection of airway -STAT head CT was ordered, as was EEG to r/o seizures -Pt to be transferred to ICU -Updated pt's wife at bedside  HTN -BP remains labile and suboptimally controlled -Currently on norvasc  10mg , Coreg  25mg  BID, Hydralazine  100mg  q8h, -bp remains suboptimal -added clonidine  patch 0.1mg   HLD -cont statin -LDL 43  DM2, uncontrolled with hyperglycemia -glucose trends in the mid-200's -Noted to have been on 100u/day per insulin  pump PTA -recently increased semglee  to 35 u. Cont to titrate insulin  with goal of euglycemia  CKD4 -Cr trending up -BUN trending up to 140's -recommend Nephrology consultation  Bladder outlet obstruction ->400cc noted on bladder scan, requiring I/o cath  Dysphagia -Currently coretrak dependent -Not following commands at this time -If pt is unable to wean off coretrak, then may need PEG  COPD -O2 sats down to 85% on RA this AM, improved to mid-90's with [facility] -Poor air movement on exam -Nebs given -Now intubated     Subjective: Cannot assess given mentation. Not responsive to painful stimuli  Physical Exam: Vitals:   08/17/24 1415 08/17/24 1430 08/17/24 1445 08/17/24 1500  BP: (!) 97/57 106/77 (!) 89/56 (!) 92/55   Pulse: 67 66 61 67  Resp: (!) 24 (!) 24 (!) 24 (!) 24  Temp:      TempSrc:      SpO2: 96% 97% 98% 97%  Weight:      Height:       General exam: laying in bed, in nad Respiratory system: shallow breaths with increased resp effort, poor air movement Cardiovascular system: regular rate, s1, s2 Gastrointestinal system: Soft, nondistended Central nervous system: not responsive to painful stimuli Extremities: Perfused, no clubbing Skin: Normal skin turgor, no notable skin lesions seen Psychiatry: Unable to assess given mentation  Data Reviewed:  Labs reviewed: Na 149, K 6.2, BUN 141, Cr 4.59, WBC 5.0, Hgb 11.5, Plts 125   Family Communication: Pt in room, family at bedside  CRITICAL CARE Performed by: Garnette Pelt   Total critical care time: 45 minutes  Critical care time was exclusive of separately billable procedures and treating other patients.  Critical care was necessary to treat or prevent imminent or life-threatening deterioration.  Critical care was time spent personally by me on the following activities: development of treatment plan with patient and/or surrogate as well as nursing, discussions with consultants, evaluation of patient's response to treatment, examination of patient, obtaining history from patient or surrogate, ordering and performing treatments and interventions, ordering and review of laboratory studies, ordering and review of radiographic studies, pulse oximetry and re-evaluation of patient's condition.   Disposition: Status is: Inpatient Remains inpatient appropriate because: severity of illness  Planned Discharge Destination: Skilled nursing facility    Author: Garnette Pelt, MD 08/17/2024 3:16 PM  For on call review [email or web address].

## 2024-08-17 NOTE — Progress Notes (Signed)
 Pt was transported from 3w21 to CT scan and then to 4n21. No complications noted with transfer.

## 2024-08-17 NOTE — Progress Notes (Signed)
 Patient is being transferred to ICU at the moment.

## 2024-08-17 NOTE — Progress Notes (Signed)
 Consulted Dr. Harold to see if he wanted to keep NPO d/t concern for aspiration PNA. He stated yes hold coretrak tube feeds for now,

## 2024-08-17 NOTE — Progress Notes (Signed)
 Physical Therapy Treatment Patient Details Name: Alec [name] MRN: [id number] DOB: [birth year] Today's Date: 08/17/2024   History of Present Illness 78 y.o. male presents to [facility] hospital on 08/10/2024 with R weakness and aphasia. CT head with finding of large L basal ganglia hemorrhage. PMH includes DM, HTN, CAD, CHF, RA, COPD.    PT Comments  Treatment today ultimately limited by medical complications and alertness. Co-treat done today due to complexity of patient deficits. Upon entering room, noted gasping inhalation pattern. Attempts to arouse pt were overall unsuccessful, including sitting upright, verbal and tactile stimulation, and noxious stimuli to extremities. Pt L eye demonstrates blink to threat, but R eye does not today. However, pt does spontaneously move L UE and LE, but this was infrequent. Noted wheezing and occasional stridor sound upon return to supine with L cervical rotation. SpO2 85% on RA, which improved to 95% on 2L [place]. MD and RN notified of respiratory state. In light of recent deficits, will recommend post-acute rehab <3hrs/day to improve safety with functional mobility. Acute PT to follow.    If plan is discharge home, recommend the following: Two people to help with walking and/or transfers;Two people to help with bathing/dressing/bathroom;Assistance with feeding;Direct supervision/assist for medications management;Direct supervision/assist for financial management;Assist for transportation;Help with stairs or ramp for entrance;Supervision due to cognitive status   Can travel by private vehicle     No  Equipment Recommendations  Wheelchair (measurements PT);Wheelchair cushion (measurements PT);Hospital bed;Hoyer lift;BSC/3in1    Recommendations for Other Services       Precautions / Restrictions Precautions Precautions: Fall;Other (comment) Recall of Precautions/Restrictions: Impaired Precaution/Restrictions Comments: SBP<160, cortrak, pushes himself to his  R Restrictions Weight Bearing Restrictions Per Provider Order: No     Mobility  Bed Mobility Overal bed mobility: Needs Assistance Bed Mobility: Supine to Sit, Sit to Supine     Supine to sit: Total assist, +2 for physical assistance Sit to supine: Total assist, +2 for physical assistance   General bed mobility comments: Pt largely unarousable today, requiring totalA +2 for sit<>supine.    Transfers                        Ambulation/Gait                   Stairs             Wheelchair Mobility     Tilt Bed    Modified Rankin (Stroke Patients Only)       Balance Overall balance assessment: Needs assistance Sitting-balance support: Bilateral upper extremity supported Sitting balance-Leahy Scale: Poor Sitting balance - Comments: Reliant on totalA for sitting balance today.                                    Communication Communication Communication: Impaired Factors Affecting Communication: Difficulty expressing self;Reduced clarity of speech  Cognition Arousal: Obtunded, Lethargic     PT - Cognitive impairments: Difficult to assess Difficult to assess due to: Level of arousal                     PT - Cognition Comments: Pt largely unarousable throughout session despite use verbal, tactile, and noxious stimuli in attempts to arouse him. Pt opened eyes slightly in sitting and supine to L lower quadrant, and would rarely spontaneously move L UE and L LE. Following commands: Impaired Following commands  impaired: Follows one step commands inconsistently    Cueing Cueing Techniques: Verbal cues, Tactile cues  Exercises Other Exercises Other Exercises: PROM ankle pumps; 10 reps; Bilateral Other Exercises: PROM knee extension stretch; 10 reps; bilateral Other Exercises: PROM straight leg raise; 10 sec hold; bilateral    General Comments General comments (skin integrity, edema, etc.): Pt with abnormal breathing  pattern today upon entry to room. Sounded like gasping, quick inhale in supine and sitting. Noted an inc in wheezing and occasional stridor with L cervical rotation in supine HOB elevated, and MD and nurse notified. O2 at 85% on RA, which improved to 95% on 2L [place].      Pertinent Vitals/Pain Pain Assessment Pain Assessment: Faces Faces Pain Scale: No hurt Pain Intervention(s): Monitored during session    Home Living                          Prior Function            PT Goals (current goals can now be found in the care plan section) Acute Rehab PT Goals Patient Stated Goal: Unable to state PT Goal Formulation: Patient unable to participate in goal setting Time For Goal Achievement: 08/26/24 Potential to Achieve Goals: Fair Progress towards PT goals: Not progressing toward goals - comment (Discharge recommendations updated)    Frequency    Min 2X/week      PT Plan      Co-evaluation PT/OT/SLP Co-Evaluation/Treatment: Yes Reason for Co-Treatment: Complexity of the patient's impairments (multi-system involvement);For patient/therapist safety;To address functional/ADL transfers PT goals addressed during session: Strengthening/ROM        AM-PAC PT 6 Clicks Mobility   Outcome Measure  Help needed turning from your back to your side while in a flat bed without using bedrails?: Total Help needed moving from lying on your back to sitting on the side of a flat bed without using bedrails?: Total Help needed moving to and from a bed to a chair (including a wheelchair)?: Total Help needed standing up from a chair using your arms (e.g., wheelchair or bedside chair)?: Total Help needed to walk in hospital room?: Total Help needed climbing 3-5 steps with a railing? : Total 6 Click Score: 6    End of Session Equipment Utilized During Treatment: Oxygen Activity Tolerance: Patient limited by lethargy;Treatment limited secondary to medical complications (Comment) Patient  left: in bed;with call bell/phone within reach;with nursing/sitter in room Nurse Communication: Mobility status;Other (comment) (Respiratory pattern) PT Visit Diagnosis: Hemiplegia and hemiparesis;Unsteadiness on feet (R26.81);Other abnormalities of gait and mobility (R26.89);Muscle weakness (generalized) (M62.81);Difficulty in walking, not elsewhere classified (R26.2);Other symptoms and signs involving the nervous system (R29.898) Hemiplegia - Right/Left: Right Hemiplegia - dominant/non-dominant: Dominant Hemiplegia - caused by: Nontraumatic intracerebral hemorrhage     Time: 0921-0946 PT Time Calculation (min) (ACUTE ONLY): 25 min  Charges:    $Therapeutic Activity: 8-22 mins PT General Charges $$ ACUTE PT VISIT: 1 Visit                     Jon Kasparek, SPT    Cymone Yeske 08/17/2024, 11:15 AM

## 2024-08-17 NOTE — Progress Notes (Signed)
 Pharmacy Antibiotic Note  Alec [name] is a 78 y.o. male admitted on 08/10/2024 with pneumonia.  Pharmacy has been consulted for Zosyn  dosing.  Plan: Zosyn  2.25g q6h.  Follow culture data for de-escalation.  Monitor renal function for dose adjustments as indicated.   Height: 5' 6 (167.6 cm) Weight: 88.9 kg (195 lb 15.8 oz) IBW/kg (Calculated) : 63.8  Temp (24hrs), Avg:97.9 F (36.6 C), Min:97.1 F (36.2 C), Max:99.3 F (37.4 C)  Recent Labs  Lab 08/14/24 0234 08/15/24 0315 08/16/24 0206 08/17/24 1014 08/17/24 1249  WBC 3.9* 3.6* 3.6* 5.0 3.1*  CREATININE 3.47* 3.58* 3.73* 4.59* 4.57*  LATICACIDVEN  --   --   --   --  1.2    Estimated Creatinine Clearance: 14.4 mL/min (A) (by C-G formula based on SCr of 4.57 mg/dL (H)).    No Active Allergies  Antimicrobials this admission: Zosyn  11/18 >>   Microbiology results: 11/18 Sputum:   11/11 MRSA PCR:   Thank you for allowing pharmacy to be a part of this patient's care.  Powell Blush, PharmD, BCCCP  08/17/2024 3:01 PM

## 2024-08-17 NOTE — Progress Notes (Signed)
 SLP Cancellation Note  Patient Details Name: NICCOLO BURGGRAF MRN: [id number] DOB: 1946-09-28   Cancelled treatment:       Reason Eval/Treat Not Completed: Medical issues which prohibited therapy. Pt transferred to ICU, now intubated. Will f/u as able.    Leita SAILOR., M.A. CCC-SLP Acute Rehabilitation Services Office: [phone]  Secure chat preferred  08/17/2024, 1:51 PM

## 2024-08-17 NOTE — Consult Note (Signed)
 Nephrology Consult   Requesting provider: Valinda Novas Service requesting consult: CCM Reason for consult: AKI on CKD IV   Assessment/Recommendations: Alec [name] is a/an 78 y.o. male with a past medical history CAD, HTN, DM2, COPD, CKD 4 who presents with ICH complicated by AKI on CKD, hypernatremia, altered mental status  Severe AKI on CKD 4: Baseline creatinine around 4.  Possibly some degree of uremia with BUN of 141.  BUN seems to have risen quickly.  This could partially be related to increased protein nutrition but mainly dehydration given concomitant elevated sodium and likely diminished access to free water - Agree with IV fluids, on half-normal saline for now.  Would change to D5 water if the patient continues to have hypernatremia -Increase enteral free water as able -Continue to monitor daily Cr, Dose meds for GFR -Monitor Daily I/Os, Daily weight  -Maintain MAP>65 for optimal renal perfusion.  -Avoid nephrotoxic medications including NSAIDs -Use synthetic opioids (Fentanyl /Dilaudid ) if needed - Briefly discussed potential for dialysis with the patient's wife.  Continue to monitor progress  Severe altered mental status: Likely multifactorial with stroke contributing.  Uremia also contributing to some degree.  Treatment of AKI as above.  Other management per primary team  Hyponatremia: Associated with intracranial pathology and decreased access to free water.  Continue IV fluids as above.  Increase enteral free water as able  Hypertension: Historical issue.  Blood pressure now low since he is sedated.  On norepinephrine   Shock: Likely vasoplegic.  Continue norepinephrine  per primary team  Hyperkalemia: Associated with AKI.  Hydration as above.  Increase insulin  as needed.  Bicarb as needed.  Lokelma as needed  Acute hypoxic respiratory failure: Likely related to altered mental status and inability to protect airway.  Now intubated.  Uncontrolled type 2 diabetes with  hyperglycemia: Management per primary team.  Titrate insulin  as needed.     Recommendations conveyed to primary service.    [facility] 08/17/2024 3:04 PM   _____________________________________________________________________________________ CC: Right-sided weakness  History of Present Illness: Alec [name] is a/an 78 y.o. male with a past medical history of CAD, HTN, DM2, COPD, CKD 4 who presents with weakness and aphasia.  Patient was sedated and not interactive so history was obtained from the wife.  Patient presented to the hospital 11/11.  Prior to presentation the patient was having trouble sitting in a chair and also had abnormal speech.  Because of the symptoms his wife brought him to the hospital.  No other preceding symptoms that she is aware of.  Denied any recent fever, chills, shortness of breath, chest pain, nausea, vomiting, diarrhea.  In the emergency department the patient was found to have a large ICH and was admitted for further management.  This was managed medically.  Plans were for the patient to go to CIR.  He was having some problems with intermittent urinary retention and had a Foley catheter in place.  His wife states that he would follow minimal commands but was not talking and conversant like he usually would be.  Last night the patient started to have tachypnea as well as worsening mental status.  On arrival by CCM he was found to be obtunded and tachypneic with some's wheezing and unable to protect his airway.  He was emergently intubated and brought to the ICU.  Labs were notable for creatinine that increased to 4.6, glucose 287, sodium 149, potassium 6.2   Medications:  Current Facility-Administered Medications  Medication Dose Route Frequency  Provider Last Rate Last Admin   0.45 % sodium chloride  infusion   Intravenous Continuous Harold Scholz, MD 100 mL/hr at 08/17/24 1324 New Bag at 08/17/24 1324   0.9 %  sodium  chloride infusion  250 mL Intravenous Continuous Claudene Fonda BROCKS, NP 10 mL/hr at 08/17/24 1245 250 mL at 08/17/24 1245   acetaminophen  (TYLENOL ) tablet 650 mg  650 mg Oral Q4H PRN Michaela Aisha SQUIBB, MD       Or   acetaminophen  (TYLENOL ) 160 MG/5ML solution 650 mg  650 mg Per Tube Q4H PRN Michaela Aisha SQUIBB, MD   650 mg at 08/14/24 1303   Or   acetaminophen  (TYLENOL ) suppository 650 mg  650 mg Rectal Q4H PRN Michaela Aisha SQUIBB, MD       albuterol  (PROVENTIL ) (2.5 MG/3ML) 0.083% nebulizer solution            allopurinol  (ZYLOPRIM ) tablet 50 mg  50 mg Per Tube Daily Jerri Pfeiffer, MD   50 mg at 08/17/24 0813   arformoterol (BROVANA) nebulizer solution 15 mcg  15 mcg Nebulization BID Smith, Joshua C, NP   15 mcg at 08/17/24 1156   atorvastatin  (LIPITOR) tablet 40 mg  40 mg Per Tube Daily Jerri Pfeiffer, MD   40 mg at 08/17/24 0814   budesonide (PULMICORT) nebulizer solution 0.25 mg  0.25 mg Nebulization BID Smith, Joshua C, NP   0.25 mg at 08/17/24 1156   Chlorhexidine  Gluconate Cloth 2 % PADS 6 each  6 each Topical Q0600 Michaela Aisha SQUIBB, MD   6 each at 08/17/24 0635   dexmedetomidine  (PRECEDEX ) 400 MCG/100ML (4 mcg/mL) infusion  0-0.7 mcg/kg/hr Intravenous Titrated Claudene Fonda C, NP 8.89 mL/hr at 08/17/24 1314 0.4 mcg/kg/hr at 08/17/24 1314   docusate (COLACE) 50 MG/5ML liquid 100 mg  100 mg Per Tube BID Smith, Joshua C, NP       feeding supplement (GLUCERNA 1.5 CAL) liquid 1,000 mL  1,000 mL Per Tube Continuous Xu, Jindong, MD 55 mL/hr at 08/16/24 1853 1,000 mL at 08/16/24 1853   feeding supplement (PROSource TF20) liquid 60 mL  60 mL Per Tube Daily Jerri Pfeiffer, MD   60 mL at 08/17/24 0816   fentaNYL  (SUBLIMAZE ) injection 50 mcg  50 mcg Intravenous Q15 min PRN Smith, Joshua C, NP       fentaNYL  (SUBLIMAZE ) injection 50-200 mcg  50-200 mcg Intravenous Q30 min PRN Smith, Joshua C, NP   50 mcg at 08/17/24 1259   heparin  injection 5,000 Units  5,000 Units Subcutaneous Q8H Xu, Jindong, MD    5,000 Units at 08/17/24 1327   hydrALAZINE  (APRESOLINE ) injection 20 mg  20 mg Intravenous Q4H PRN Remi Pippin, NP   20 mg at 08/15/24 9192   insulin  aspart (novoLOG ) injection 0-20 Units  0-20 Units Subcutaneous Q4H Smith, Joshua C, NP   7 Units at 08/17/24 1325   insulin  glargine-yfgn (SEMGLEE ) injection 35 Units  35 Units Subcutaneous Daily Cindy Garnette POUR, MD   35 Units at 08/17/24 0816   ipratropium-albuterol  (DUONEB) 0.5-2.5 (3) MG/3ML nebulizer solution 3 mL  3 mL Nebulization Q4H PRN Cindy Garnette POUR, MD   3 mL at 08/17/24 0955   labetalol (NORMODYNE) injection 40 mg  40 mg Intravenous Q4H PRN Shafer, Devon, NP   40 mg at 08/17/24 0407   latanoprost  (XALATAN ) 0.005 % ophthalmic solution 1 drop  1 drop Both Eyes QHS Jerri Pfeiffer, MD   1 drop at 08/16/24 2134   norepinephrine  (LEVOPHED ) 4mg  in (0.016  mg/mL) premix infusion  0-10 mcg/min Intravenous Titrated Smith, Joshua C, NP 7.5 mL/hr at 08/17/24 1127 2 mcg/min at 08/17/24 1127   pantoprazole  (PROTONIX ) injection 40 mg  40 mg Intravenous QHS Michaela Aisha SQUIBB, MD   40 mg at 08/16/24 2134   piperacillin -tazobactam (ZOSYN ) IVPB 2.25 g  2.25 g Intravenous Q6H Tanda Powell ORN, RPH       polyethylene glycol (MIRALAX  / GLYCOLAX ) packet 17 g  17 g Per Tube Daily Smith, Joshua C, NP       QUEtiapine (SEROQUEL) tablet 25 mg  25 mg Per Tube QHS Jerri Pfeiffer, MD   25 mg at 08/16/24 2133   senna-docusate (Senokot-S) tablet 1 tablet  1 tablet Per Tube BID Fobbs, Rodericks T, RPH   1 tablet at 08/17/24 0813   sertraline  (ZOLOFT ) tablet 50 mg  50 mg Per Tube Daily Marten Larraine HERO, RPH   50 mg at 08/17/24 0813   succinylcholine  (ANECTINE ) 200 MG/10ML syringe              ALLERGIES Patient has no active allergies.  MEDICAL HISTORY Past Medical History:  Diagnosis Date   Anemia    Anxiety    Aortic insufficiency    Arthritis    Bilateral lower extremity edema    Chronic gout    06-19-2020  per pt last episode 6 months , great toe    CKD (chronic kidney disease), stage IV (HCC)    Coronary artery disease cardiologist--- dr hochrein   CABG 2019   Degenerative arthritis of shoulder region 08/2013   left   History of bladder cancer 07/2008   s/p  TURBT   History of cellulitis 11/2017   left upper arm   History of kidney stones    Hyperlipidemia    Hypertension    followed by pcp/ cardiology   IDDM (insulin  dependent diabetes mellitus)    endocrinologist--- dr von---  pt uses insulin  pump and dexcom  (06-19-2020 per pt fasting sugar-- 98--110)   Insulin  pump in place    Mitral regurgitation    OSA (obstructive sleep apnea)    Prostate cancer [facility]) urologist--- dr herrick/  oncologist--- manning   dx 11/ 2017  Gleason 3+3 active survillance;  bx 03-14-2020 Gleason 3+4   RA (rheumatoid arthritis) (HCC)    rhemotologist--- dr donnis---     S/P CABG x 3 07/15/2018   LIMA to LAD;  SVG to PDA;  SVG to RI   Thrombocytopenia      SOCIAL HISTORY Social History   Socioeconomic History   Marital status: Married    Spouse name: Not on file   Number of children: Not on file   Years of education: Not on file   Highest education level: Not on file  Occupational History   Not on file  Tobacco Use   Smoking status: Former    Current packs/day: 0.00    Average packs/day: 2.0 packs/day for 25.0 years (50.0 ttl pk-yrs)    Types: Cigarettes    Start date: 09/11/1972    Quit date: 09/11/1997    Years since quitting: 26.9   Smokeless tobacco: Never  Vaping Use   Vaping status: Never Used  Substance and Sexual Activity   Alcohol use: Not Currently    Comment: >20 years ago, no heavy use   Drug use: Never   Sexual activity: Yes    Partners: Female    Comment: MARRIED  Other Topics Concern   Not on file  Social History  Narrative   Not on file   Social Drivers of Health   Financial Resource Strain: Not on file  Food Insecurity: Patient Unable To Answer (08/13/2024)   Hunger Vital Sign    Worried About  Running Out of Food in the Last Year: Patient unable to answer    Ran Out of Food in the Last Year: Patient unable to answer  Transportation Needs: Patient Unable To Answer (08/13/2024)   PRAPARE - Transportation    Lack of Transportation (Medical): Patient unable to answer    Lack of Transportation (Non-Medical): Patient unable to answer  Physical Activity: Not on file  Stress: Not on file  Social Connections: Patient Unable To Answer (08/13/2024)   Social Connection and Isolation Panel    Frequency of Communication with Friends and Family: Patient unable to answer    Frequency of Social Gatherings with Friends and Family: Patient unable to answer    Attends Religious Services: Patient unable to answer    Active Member of Clubs or Organizations: Patient unable to answer    Attends Banker Meetings: Patient unable to answer    Marital Status: Patient unable to answer  Intimate Partner Violence: Patient Unable To Answer (08/13/2024)   Humiliation, Afraid, Rape, and Kick questionnaire    Fear of Current or Ex-Partner: Patient unable to answer    Emotionally Abused: Patient unable to answer    Physically Abused: Patient unable to answer    Sexually Abused: Patient unable to answer     FAMILY HISTORY Family History  Problem Relation Age of Onset   Diabetes Mother    Lung cancer Mother    Cancer Father    Coronary artery disease Father        MI at age 40.    Lung cancer Father    Sudden death Son    CAD Maternal Grandfather        MI in his 23's   CAD Paternal Grandfather    Breast cancer Neg Hx    Colon cancer Neg Hx    Pancreatic cancer Neg Hx       Review of Systems: Unable to obtain due to the patient's AMS/sedation  Physical Exam: Vitals:   08/17/24 1445 08/17/24 1500  BP: (!) 89/56 (!) 92/55  Pulse: 61 67  Resp: (!) 24 (!) 24  Temp:    SpO2: 98% 97%   Total I/O In: -  Out: 200 [Urine:200]  Intake/Output Summary (Last 24 hours) at 08/17/2024  1504 Last data filed at 08/17/2024 0830 Gross per 24 hour  Intake --  Output 1900 ml  Net -1900 ml   General: Lying in bed, sedated, no distress, critically ill-appearing HEENT: anicteric sclera, oropharynx clear without lesions CV: Normal rate, no rub, no lower extremity edema Lungs: clear to auscultation bilaterally, bilateral chest rise, ventilated Abd: soft, non-tender, non-distended Skin: no visible lesions or rashes Psych: Sedated, not interactive Musculoskeletal:  no obvious deformities Neuro: Sedated and not interactive  Test Results Reviewed Lab Results  Component Value Date   NA 149 (H) 08/17/2024   K 5.3 (H) 08/17/2024   CL 116 (H) 08/17/2024   CO2 24 08/17/2024   BUN 143 (H) 08/17/2024   CREATININE 4.57 (H) 08/17/2024   GFR 23.26 (L) 07/10/2023   CALCIUM  8.6 (L) 08/17/2024   ALBUMIN  3.0 (L) 08/17/2024   PHOS 3.3 08/14/2024    CBC Recent Labs  Lab 08/10/24 1901 08/10/24 1902 08/16/24 0206 08/17/24 1014 08/17/24 1220 08/17/24 1249  WBC  3.3*   < > 3.6* 5.0  --  3.1*  NEUTROABS 2.1  --   --   --   --   --   HGB 10.5*  10.2*   < > 11.3* 11.5* 9.9* 10.5*  HCT 31.9*  30.0*   < > 34.4* 35.6* 29.0* 32.6*  MCV 94.4   < > 94.5 96.0  --  96.2  PLT 91*   < > 120* 125*  --  114*   < > = values in this interval not displayed.    I have reviewed all relevant outside healthcare records related to the patient's current hospitalization

## 2024-08-17 NOTE — Progress Notes (Signed)
 EEG complete - results pending

## 2024-08-17 NOTE — Procedures (Signed)
 Patient Name: Alec [name]  MRN: [id number]  Epilepsy Attending: Arlin MALVA Krebs  Referring Physician/Provider: Cindy Garnette POUR, MD  Date: 08/17/2024 Duration: 22.57 mins  Patient history: 77 year old male with left basal ganglia ICH.  EEG to evaluate for seizure.  Level of alertness: comatose/ lethargic   AEDs during EEG study: None  Technical aspects: This EEG study was done with scalp electrodes positioned according to the 10-20 International system of electrode placement. Electrical activity was reviewed with band pass filter of 1-70Hz , sensitivity of 7 uV/mm, display speed of 66mm/sec with a 60Hz  notched filter applied as appropriate. EEG data were recorded continuously and digitally stored.  Video monitoring was available and reviewed as appropriate.  Description: EEG showed continuous generalized and lateralized left hemisphere 5 to 7 Hz theta slowing admixed with intermittent 2 to 3 Hz delta slowing.  Hyperventilation and photic stimulation were not performed.     ABNORMALITY - Continuous slow, generalized and lateralized left hemisphere  IMPRESSION: This study is suggestive of cortical dysfunction in left hemisphere likely secondary to underlying hemorrhage.  Additionally there is generalized cerebral dysfunction (encephalopathy). No seizures or epileptiform discharges were seen throughout the recording.  [name] [name]

## 2024-08-17 NOTE — Progress Notes (Signed)
 Rapid response Helle gave 1mg  of Versed . Lela and I wasted 1mg .

## 2024-08-17 NOTE — Progress Notes (Signed)
 Occupational Therapy Treatment Patient Details Name: Alec [name] MRN: [id number] DOB: [birth year] Today's Date: 08/17/2024   History of present illness 78 y.o. male presents to [facility] hospital on 08/10/2024 with R weakness and aphasia. CT head with finding of large L basal ganglia hemorrhage. PMH includes DM, HTN, CAD, CHF, RA, COPD.   OT comments  Patient seen with PT to address bed mobility and sitting balance. Patient appeared lethargic initially and required total assist +2 to get to EOB.  Patient continued to be difficulty to arouse and demonstrated gasping breathing pattern.  Patient was assisted back to supine for BUE ROM and would move LUE spontaneously and attempted to wash face.  SpO2 check with 85% on RA and nursing notified.  Patient placed on 2 liters O2 and SpO2 increased to 95%.  Patient will benefit from continued inpatient follow up therapy, <3 hours/day due to current deficits and patient unable to tolerate >3hrs/day therapy at this time.  Acute OT to continue to follow to address established goals to facilitate DC to next venue of care.        If plan is discharge home, recommend the following:  Two people to help with walking and/or transfers;A lot of help with walking and/or transfers;A lot of help with bathing/dressing/bathroom;Two people to help with bathing/dressing/bathroom;Assistance with cooking/housework;Direct supervision/assist for medications management;Direct supervision/assist for financial management;Assist for transportation;Help with stairs or ramp for entrance   Equipment Recommendations  None recommended by OT (defer)    Recommendations for Other Services      Precautions / Restrictions Precautions Precautions: Fall;Other (comment) Recall of Precautions/Restrictions: Impaired Precaution/Restrictions Comments: SBP<160, cortrak, pushes himself to his R Restrictions Weight Bearing Restrictions Per Provider Order: No       Mobility Bed Mobility Overal  bed mobility: Needs Assistance Bed Mobility: Supine to Sit, Sit to Supine     Supine to sit: Total assist, +2 for physical assistance Sit to supine: Total assist, +2 for physical assistance   General bed mobility comments: total assist +2 due to level of arousal    Transfers Overall transfer level: Needs assistance                 General transfer comment: not attempted     Balance Overall balance assessment: Needs assistance Sitting-balance support: Bilateral upper extremity supported Sitting balance-Leahy Scale: Poor Sitting balance - Comments: Reliant on totalA for sitting balance today.                                   ADL either performed or assessed with clinical judgement   ADL Overall ADL's : Needs assistance/impaired     Grooming: Maximal assistance;Bed level Grooming Details (indicate cue type and reason): able to start washing face but unable to maintain arousal to compelte                               General ADL Comments: max to total assist with self care    Extremity/Trunk Assessment              Vision       Perception     Praxis     Communication Communication Communication: Impaired Factors Affecting Communication: Difficulty expressing self;Reduced clarity of speech   Cognition Arousal: Obtunded, Lethargic   Cognition: Difficult to assess Difficult to assess due to: Level of arousal  OT - Cognition Comments: patient only opening eyes once or twice during session                 Following commands: Impaired Following commands impaired: Follows one step commands inconsistently      Cueing   Cueing Techniques: Verbal cues, Tactile cues  Exercises Exercises: General Upper Extremity General Exercises - Upper Extremity Shoulder Flexion: PROM, Both, 10 reps, Supine Shoulder ABduction: PROM, Both, 10 reps, Supine Elbow Flexion: PROM, Both, 10 reps, Supine Elbow Extension: PROM,  Both, 10 reps, Supine    Shoulder Instructions       General Comments patient demonstrating abnormal breathing patterns and unable to fully arouse.  SpO2 85% on RA and placed on 2 liters with 95% SpO2    Pertinent Vitals/ Pain       Pain Assessment Pain Assessment: Faces Faces Pain Scale: No hurt Pain Intervention(s): Monitored during session  Home Living                                          Prior Functioning/Environment              Frequency  Min 2X/week        Progress Toward Goals  OT Goals(current goals can now be found in the care plan section)  Progress towards OT goals: Not progressing toward goals - comment (due to level of arousal)  Acute Rehab OT Goals Patient Stated Goal: unable OT Goal Formulation: Patient unable to participate in goal setting Time For Goal Achievement: 08/26/24 Potential to Achieve Goals: Fair ADL Goals Pt Will Perform Grooming: with min assist;sitting Pt Will Perform Upper Body Dressing: with min assist;sitting Pt Will Perform Lower Body Dressing: with max assist;sit to/from stand Pt Will Transfer to Toilet: with max assist;stand pivot transfer;bedside commode Additional ADL Goal #1: Pt will complete bed mobility with mod A as a precursor to ADLs  Plan      Co-evaluation    PT/OT/SLP Co-Evaluation/Treatment: Yes Reason for Co-Treatment: Complexity of the patient's impairments (multi-system involvement);For patient/therapist safety;To address functional/ADL transfers PT goals addressed during session: Strengthening/ROM OT goals addressed during session: Strengthening/ROM      AM-PAC OT 6 Clicks Daily Activity     Outcome Measure   Help from another person eating meals?: Total Help from another person taking care of personal grooming?: A Lot Help from another person toileting, which includes using toliet, bedpan, or urinal?: Total Help from another person bathing (including washing, rinsing,  drying)?: Total Help from another person to put on and taking off regular upper body clothing?: Total Help from another person to put on and taking off regular lower body clothing?: Total 6 Click Score: 7    End of Session Equipment Utilized During Treatment: Oxygen (2 liters at end of session)  OT Visit Diagnosis: Unsteadiness on feet (R26.81);Other abnormalities of gait and mobility (R26.89);Muscle weakness (generalized) (M62.81);Hemiplegia and hemiparesis Hemiplegia - Right/Left: Right Hemiplegia - dominant/non-dominant: Dominant Hemiplegia - caused by: Other Nontraumatic intracranial hemorrhage   Activity Tolerance Patient limited by lethargy   Patient Left in bed;with call bell/phone within reach;with bed alarm set;with nursing/sitter in room   Nurse Communication Mobility status;Other (comment) (low SpO2 and irregular breathing pattern)        Time: 9078-9054 OT Time Calculation (min): 24 min  Charges: OT General Charges $OT Visit: 1 Visit OT Treatments $Therapeutic Exercise: 8-22 mins  Dick [name], OTA Acute Rehabilitation Services  Office [phone]   Alec [name] 08/17/2024, 12:08 PM

## 2024-08-17 NOTE — Significant Event (Signed)
 Rapid Response Event Note   Reason for Call :  Urgent intubation  Initial Focused Assessment:  Patient is minimally responsive with labored breathing.  Dr Harold and Sherlean NP at bedside preparing for intubation  Wife at bedside  Interventions:  RSI medications:  50mcg Fentanyl , 1mg  Ativan, 20mg  Etomidate, 90mg  Rocuronium  IV 1L LR bolus 160mcg Phenylephrine  IVP x 3 for hypotension post intubation 1 amp Bicarb IV 1 amp D 50 and 10u insulin  IV Levophed  gtt started at 2mcg per orders See Flowsheet for VS  Transported to CT Transported to 4N21,  ICU staff at bedside to receive patient.   Plan of Care:   Event Summary:   MD Notified: Dr Harold at bedside Call Time: 1025 Arrival Time: 1029 End Time: 1215  Elvin Portland, RN

## 2024-08-17 NOTE — Consult Note (Signed)
 NAME:  Alec [name], MRN:  [id number], DOB:  [birth year], LOS: 7 ADMISSION DATE:  08/10/2024, CONSULTATION DATE: 11/18  REFERRING MD:  MD Cindy CHIEF COMPLAINT:  AMS   History of Present Illness:  Patient is a 78 year old male with significant past medical history of hypertension, hyperlipidemia, CAD, CHF, DM 2-insulin -dependent, COPD, CKD stage IV, and thrombocytopenia who was admitted on 11/11 and found to have a left-sided subcortical ICH suspected due to hypertension with TRH managing and neurology following.  Per report, patient having periods of agitation, intermittent command following, but having dysarthric speech. Overnight on 11/17 into 11/18, patient started having increase in respiratory rate which continued to increase progressively.  TRH and respiratory therapy consulted PCCM for emergent consult in the setting of respiratory distress and worsening mental status.  Upon arrival to bedside, patient obtunded, tachypneic respiratory rate 30s to 40s, and having significant expiratory wheezing.  Patient unable to protect airway.  Wife updated at bedside about emergent intubation and stat CT of head.  Preintubation ABG-pH 7.37, pCO2 38, PaO2 86, bicarb 22-, patient on 2 L nasal cannula  Morning labs NA 49, K6.2, CL 116, CO2 21, glucose 287, BUN 141, CR 4.59, calcium  9, anion gap 12 albumin  3, GFR 13  WBCs 5, hemoglobin 11.5, platelets 125     Pertinent  Medical History   Past Medical History:  Diagnosis Date   Anemia    Anxiety    Aortic insufficiency    Arthritis    Bilateral lower extremity edema    Chronic gout    06-19-2020  per pt last episode 6 months , great toe   CKD (chronic kidney disease), stage IV (HCC)    Coronary artery disease cardiologist--- dr hochrein   CABG 2019   Degenerative arthritis of shoulder region 08/2013   left   History of bladder cancer 07/2008   s/p  TURBT   History of cellulitis 11/2017   left upper arm   History of kidney stones     Hyperlipidemia    Hypertension    followed by pcp/ cardiology   IDDM (insulin  dependent diabetes mellitus)    endocrinologist--- dr von---  pt uses insulin  pump and dexcom  (06-19-2020 per pt fasting sugar-- 98--110)   Insulin  pump in place    Mitral regurgitation    OSA (obstructive sleep apnea)    Prostate cancer [facility]) urologist--- dr herrick/  oncologist--- manning   dx 11/ 2017  Gleason 3+3 active survillance;  bx 03-14-2020 Gleason 3+4   RA (rheumatoid arthritis) (HCC)    rhemotologist--- dr donnis---     S/P CABG x 3 07/15/2018   LIMA to LAD;  SVG to PDA;  SVG to RI   Thrombocytopenia      Significant Hospital Events: Including procedures, antibiotic start and stop dates in addition to other pertinent events   11/11 left-sided subcortical ICH 11/18 PCCM emergent consult for respiratory distress/decreased mental status  Interim History / Subjective:  Rapid Response  Patient obtunded, unable to protect airway   Core track in place  STAT intubation, STAT CT of head   Objective    Blood pressure (!) 128/58, pulse 78, temperature (!) 97.5 F (36.4 C), temperature source Oral, resp. rate (!) 32, height 5' 6 (1.676 m), weight 88.9 kg, SpO2 96%.        Intake/Output Summary (Last 24 hours) at 08/17/2024 1124 Last data filed at 08/17/2024 0830 Gross per 24 hour  Intake --  Output 2150 ml  Net -2150  ml   Filed Weights   08/13/24 0500 08/14/24 0500 08/16/24 0500  Weight: 87.6 kg 87.5 kg 88.9 kg    Examination: General: Acute on chronically critically ill older male in acute respiratory distress HENT:  normocephalic, PERRLA intact-sluggish 2 mm B/L, teeth intact/poor dentition, pink MM, thick oral secretions, core track in place Lungs: Diminished, expiratory wheezing noted, tachypneic, in respiratory distress Cardiovascular: S1, S2, RRR, normal sinus rhythm Abdomen: Bowel sounds active, soft Extremities: No purposeful movement on right, movement to  pain Neuro: RASS -2, obtunded, barely moving to pain, on left side, no movement on the right side GU: Deferred  Resolved problem list   Assessment and Plan  Left ICH secondary to hypertension-11/11 Acute metabolic encephalopathy AMS Agitation/delirium CT of head 11/11 3.0 x 2.8 x 2.6 cm (estimated volume 11 mL) acute intraparenchymal hemorrhage centered at the left basal ganglia.  CT head 11/14 No significant interval change in size and morphology of intraparenchymal hemorrhage centered at the left basal ganglia. Surrounding vasogenic edema with regional mass effect and 4 mm of left-to-right shift, not appreciably changed. MRI 11/13 Left basal ganglia intraparenchymal hemorrhage measuring approximately 4.4 x 3.5 x 3.8 cm with moderate surrounding vasogenic edema and fluid-fluid level from hematocrit effect, similar to the prior exam. Mild rightward shift of midline structures by approximately 4 mm P: STAT CT of head w/o contrast- unchanged, no cerebral edema  Continue neuroprotective measures: normothermia, normoxia, normocapnia, euvolemia, monitor and keep electrolytes in normal limits  Limit sedation, to evaluate neuro exam Obtain lactic acid, CBC, BMET, Repeat ABG- after intubation  Continue frequent neuro checks  Delirium precautions  Aspiration precautions   Acute Respiratory Failure  Inability to Protect Airway COPD hx  STAT intubation P:  Continue ventilator support and lung protective strategies  Continue LTVV  Wean PEEP and Fio2 requirements to sat goal of >92%  HOB > 30 degrees Plat < 30  Aim for Driving pressures < 15  Intermittent Chest X-ray and ABGS Obtain and follow cultures tracheal aspirate VAP and PAD protocols in place  Wean sedation as tolerated, SBT and WUA daily - fentanyl  prn  Utilize precedex  gtt low dose  Place on zosyn  for now  Triple BD ordered Trend WBC, fever curve   Hypotension secondary to sedation  - in setting of intubation  Received  neo pushes 240 mcg total 1 L bolus of LR  Started on low dose levophed   P: Continue MAP goal of > 65  Continue to wean levo as tolerated  Hold HTN meds while on pressor  Continue cardiac monitoring   AKI on CKD Stage IV  Hyperkalemia Hypernatremia Worsening Creatinine, renal function  P: Continue to trend renal function daily  Continue to monitor and optimize electrolytes daily Continue to monitor urine output Continue strict I/Os Continue Adequate renal perfusion  Avoid nephrotoxic agents  Obtain renal ultrasound Will consult nephro  Place foley Place on 1/2 normal saline, no cerebral edema- bleed > week out   Hyperkalemia reversed, repeat labs pending   HTN HLD  P: Hold norvasc , hydral, coreg  in setting of hypotension  Hydral prn if comes off levophed   Continue statin   DM2 Hyperglycemia  Not controlled  P: Continue SSI, CBG q4, increase to resistant Continue lantus  35 units daily   Thrombocytopenia Hgb 11.5, plts 125 P: CT head unchanged, no cerebral edema  Continue to trend CBC- h/h/ plts daily    Labs   CBC: Recent Labs  Lab 08/10/24 1901 08/10/24 1902 08/13/24 0349  08/14/24 0234 08/15/24 0315 08/16/24 0206 08/17/24 1014  WBC 3.3*   < > 3.8* 3.9* 3.6* 3.6* 5.0  NEUTROABS 2.1  --   --   --   --   --   --   HGB 10.5*  10.2*   < > 11.2* 10.9* 10.8* 11.3* 11.5*  HCT 31.9*  30.0*   < > 34.0* 32.5* 33.1* 34.4* 35.6*  MCV 94.4   < > 95.2 94.2 95.1 94.5 96.0  PLT 91*   < > 81* 84* 100* 120* 125*   < > = values in this interval not displayed.    Basic Metabolic Panel: Recent Labs  Lab 08/11/24 1942 08/12/24 0612 08/13/24 0349 08/14/24 0234 08/15/24 0315 08/16/24 0206 08/17/24 1014  NA  --  135 140 141 142 145 149*  K  --  4.6 4.5 4.9 5.0 5.0 6.2*  CL  --  107 110 113* 111 115* 116*  CO2  --  18* 18* 18* 20* 19* 21*  GLUCOSE  --  182* 172* 198* 241* 217* 287*  BUN  --  49* 56* 69* 90* 107* 141*  CREATININE  --  3.54* 3.38* 3.47* 3.58*  3.73* 4.59*  CALCIUM   --  8.7* 9.0 9.1 9.2 9.3 9.0  MG 2.2 2.5* 2.4 2.6*  --  2.9*  --   PHOS 3.8 3.4 3.7 3.3  --   --   --    GFR: Estimated Creatinine Clearance: 14.3 mL/min (A) (by C-G formula based on SCr of 4.59 mg/dL (H)). Recent Labs  Lab 08/14/24 0234 08/15/24 0315 08/16/24 0206 08/17/24 1014  WBC 3.9* 3.6* 3.6* 5.0    Liver Function Tests: Recent Labs  Lab 08/10/24 1901 08/16/24 0206 08/17/24 1014  AST 27 22 37  ALT 21 25 40  ALKPHOS 65 71 82  BILITOT 0.7 0.9 0.8  PROT 6.6 7.1 7.2  ALBUMIN  3.5 3.1* 3.0*   No results for input(s): LIPASE, AMYLASE in the last 168 hours. No results for input(s): AMMONIA in the last 168 hours.  ABG    Component Value Date/Time   PHART 7.37 08/17/2024 1014   PCO2ART 38 08/17/2024 1014   PO2ART 86 08/17/2024 1014   HCO3 22.0 08/17/2024 1014   TCO2 19 (L) 08/10/2024 1902   ACIDBASEDEF 2.9 (H) 08/17/2024 1014   O2SAT 99.3 08/17/2024 1014     Coagulation Profile: Recent Labs  Lab 08/10/24 1901  INR 1.0    Cardiac Enzymes: No results for input(s): CKTOTAL, CKMB, CKMBINDEX, TROPONINI in the last 168 hours.  HbA1C: Hemoglobin A1C  Date/Time Value Ref Range Status  05/13/2024 08:43 AM 6.1 (A) 4.0 - 5.6 % Final  12/09/2019 12:00 AM 6.7  Final   Hgb A1c MFr Bld  Date/Time Value Ref Range Status  08/12/2024 06:12 AM 6.0 (H) 4.8 - 5.6 % Final    Comment:    (NOTE) Diagnosis of Diabetes The following HbA1c ranges recommended by the American Diabetes Association (ADA) may be used as an aid in the diagnosis of diabetes mellitus.  Hemoglobin             Suggested A1C NGSP%              Diagnosis  <5.7                   Non Diabetic  5.7-6.4                Pre-Diabetic  >6.4  Diabetic  <7.0                   Glycemic control for                       adults with diabetes.    01/15/2024 08:40 AM 6.1 (H) <5.7 % Final    Comment:    For someone without known diabetes, a hemoglobin   A1c value between 5.7% and 6.4% is consistent with prediabetes and should be confirmed with a  follow-up test. . For someone with known diabetes, a value <7% indicates that their diabetes is well controlled. A1c targets should be individualized based on duration of diabetes, age, comorbid conditions, and other considerations. . This assay result is consistent with an increased risk of diabetes. . Currently, no consensus exists regarding use of hemoglobin A1c for diagnosis of diabetes for children. .     CBG: Recent Labs  Lab 08/16/24 1534 08/16/24 2119 08/16/24 2304 08/17/24 0414 08/17/24 0750  GLUCAP 242* 219* 224* 178* 217*    Review of Systems:   See HPI   Past Medical History:  He,  has a past medical history of Anemia, Anxiety, Aortic insufficiency, Arthritis, Bilateral lower extremity edema, Chronic gout, CKD (chronic kidney disease), stage IV (HCC), Coronary artery disease (cardiologist--- dr lavona), Degenerative arthritis of shoulder region (08/2013), History of bladder cancer (07/2008), History of cellulitis (11/2017), History of kidney stones, Hyperlipidemia, Hypertension, IDDM (insulin  dependent diabetes mellitus), Insulin  pump in place, Mitral regurgitation, OSA (obstructive sleep apnea), Prostate cancer [facility]) (urologist--- dr herrick/  oncologist--- manning), RA (rheumatoid arthritis) (HCC), S/P CABG x 3 (07/15/2018), and Thrombocytopenia.   Surgical History:   Past Surgical History:  Procedure Laterality Date   CHOLECYSTECTOMY  08/29/2012   Procedure: LAPAROSCOPIC CHOLECYSTECTOMY WITH INTRAOPERATIVE CHOLANGIOGRAM;  Surgeon: Donnice POUR. Tsuei, MD;  Location: [facility];  Service: General;  Laterality: N/A;   CORONARY ARTERY BYPASS GRAFT N/A 07/20/2018   Procedure: CORONARY ARTERY BYPASS GRAFTING (CABG) times three using left internal mammary artery to the LAD, and using endoscopically harvested right saphenous vein to PDA and intermedius.;  Surgeon: Army Dallas NOVAK, MD;  Location: [facility] OR;  Service: Open Heart Surgery;  Laterality: N/A;   LEFT HEART CATH AND CORONARY ANGIOGRAPHY N/A 07/15/2018   Procedure: LEFT HEART CATH AND CORONARY ANGIOGRAPHY;  Surgeon: Anner Alm ORN, MD;  Location: [facility] INVASIVE CV LAB;  Service: Cardiovascular;  Laterality: N/A;   RADIOACTIVE SEED IMPLANT N/A 06/21/2020   Procedure: RADIOACTIVE SEED IMPLANT/BRACHYTHERAPY IMPLANT;  Surgeon: Cam Morene ORN, MD;  Location: [facility];  Service: Urology;  Laterality: N/A;   SHOULDER ARTHROSCOPY W/ ROTATOR CUFF REPAIR Right 09/03/2013   SHOULDER ARTHROSCOPY WITH SUBACROMIAL DECOMPRESSION, ROTATOR CUFF REPAIR AND BICEP TENDON REPAIR Left 09/03/2013   Procedure: LEFT SHOULDER ARTHROSCOPY WITH EXTENSIVE DEBRIDMENT, DISTAL CLAVICULECTOMY, ROTATOR CUFF REPAIR AND SUBACROMIAL DECOMPRESSION PARTIAL ACRIOMIOPLASTY WITH CORACOACROMIAL RELEASE;  Surgeon: Evalene JONETTA Chancy, MD;  Location: [facility];  Service: Orthopedics;  Laterality: Left;   SPACE OAR INSTILLATION N/A 06/21/2020   Procedure: SPACE OAR INSTILLATION;  Surgeon: Cam Morene ORN, MD;  Location[facility];  Service: Urology;  Laterality: N/A;   TEE WITHOUT CARDIOVERSION N/A 07/20/2018   Procedure: TRANSESOPHAGEAL ECHOCARDIOGRAM (TEE);  Surgeon: Army Dallas NOVAK, MD;  Location: [facility] OR;  Service: Open Heart Surgery;  Laterality: N/A;   TRANSURETHRAL RESECTION OF BLADDER TUMOR WITH MITOMYCIN -C  08/24/2008   @[facility]      Social  History:   reports that he quit smoking about 26 years ago. His smoking use included cigarettes. He started smoking about 51 years ago. He has a 50 pack-year smoking history. He has never used smokeless tobacco. He reports that he does not currently use alcohol. He reports that he does not use drugs.   Family History:  His family history includes CAD in his maternal grandfather and paternal grandfather; Cancer in his father; Coronary artery disease in his  father; Diabetes in his mother; Lung cancer in his father and mother; Sudden death in his son. There is no history of Breast cancer, Colon cancer, or Pancreatic cancer.   Allergies No Active Allergies   Home Medications  Prior to Admission medications   Medication Sig Start Date End Date Taking? Authorizing Provider  albuterol  (VENTOLIN  HFA) 108 (90 Base) MCG/ACT inhaler Inhale 2 puffs into the lungs every 6 (six) hours as needed for wheezing or shortness of breath. 06/28/24  Yes Charley Conger, PA-C  allopurinol  (ZYLOPRIM ) 100 MG tablet Take 0.5 tablets (50 mg total) by mouth daily. 03/19/23 08/10/24 Yes Gonfa, Taye T, MD  aspirin  EC 81 MG tablet Take 81 mg by mouth daily.   Yes [provider]  atorvastatin  (LIPITOR) 40 MG tablet TAKE 1 TABLET BY MOUTH EVERY DAY 04/23/24  Yes Lavona Agent, MD  Boswellia-Glucosamine-Vit D (OSTEO BI-FLEX ONE PER DAY PO) Take 1 tablet by mouth daily.   Yes [provider]  Cholecalciferol  (VITAMIN D3) 50 MCG (2000 UT) TABS Take 1 tablet by mouth daily.   Yes [provider]  CINNAMON PO Take 500 mg by mouth daily.   Yes [provider]  furosemide  (LASIX ) 20 MG tablet Take 1 tablet (20 mg total) by mouth daily. 04/12/24  Yes Lavona Agent, MD  gabapentin  (NEURONTIN ) 100 MG capsule Take 1 capsule (100 mg total) by mouth 3 (three) times daily as needed (pain). Patient taking differently: Take 100 mg by mouth at bedtime. 05/16/23  Yes Long, Fonda MATSU, MD  hydrALAZINE  (APRESOLINE ) 100 MG tablet Take 1 tablet (100 mg total) by mouth 3 (three) times daily. Patient taking differently: Take 50-100 mg by mouth See admin instructions. 1 tab in the morning, 1 tab at noon, and 2 at bedtime 03/29/22  Yes Ricky Fines, MD  insulin  aspart (NOVOLOG ) 100 UNIT/ML injection USE A MAX OF 100 UNITS DAILY VIA INSULIN  PUMP 05/13/24  Yes Thapa, Sudan, MD  isosorbide  mononitrate (IMDUR ) 120 MG 24 hr tablet TAKE 1 TABLET BY MOUTH EVERY DAY 01/07/24  Yes  Lavona Agent, MD  latanoprost  (XALATAN ) 0.005 % ophthalmic solution Place 1 drop into both eyes at bedtime. 09/18/21  Yes [provider]  Multiple Vitamins-Minerals (CENTRUM SILVER PO) Take 1 tablet by mouth daily.   Yes [provider]  nitroGLYCERIN  (NITROSTAT ) 0.4 MG SL tablet Place 1 tablet (0.4 mg total) under the tongue every 5 (five) minutes as needed for chest pain. 10/26/21 08/10/24 Yes Lavona Agent, MD  olmesartan  (BENICAR ) 5 MG tablet Take 1 tablet (5 mg total) by mouth daily. 03/24/23  Yes Gonfa, Taye T, MD  sertraline  (ZOLOFT ) 100 MG tablet TAKE 1 TABLET BY MOUTH EVERY DAY Patient taking differently: Take 50 mg by mouth daily. 04/09/24  Yes Lavona Agent, MD  tamsulosin  (FLOMAX ) 0.4 MG CAPS capsule Take 0.4 mg by mouth at bedtime.   Yes [provider]  Insulin  Human (INSULIN  PUMP) SOLN Inject into the skin.    [provider]  prednisoLONE acetate (PRED FORTE) 1 %  ophthalmic suspension SMARTSIG:In Eye(s) Patient not taking: Reported on 08/10/2024 08/06/24   [provider]     Critical care time: 18     Christian Natosha Bou AGACNP-BC   [facility] 08/17/2024, 1:11 PM  Please see [email or web address] for pager details.  From 7A-7P if no response, please call [phone]. After hours, please call ELink [phone].

## 2024-08-18 DIAGNOSIS — I129 Hypertensive chronic kidney disease with stage 1 through stage 4 chronic kidney disease, or unspecified chronic kidney disease: Secondary | ICD-10-CM

## 2024-08-18 DIAGNOSIS — J449 Chronic obstructive pulmonary disease, unspecified: Secondary | ICD-10-CM | POA: Diagnosis not present

## 2024-08-18 DIAGNOSIS — G9341 Metabolic encephalopathy: Secondary | ICD-10-CM

## 2024-08-18 DIAGNOSIS — N179 Acute kidney failure, unspecified: Secondary | ICD-10-CM

## 2024-08-18 DIAGNOSIS — J9601 Acute respiratory failure with hypoxia: Secondary | ICD-10-CM | POA: Diagnosis not present

## 2024-08-18 DIAGNOSIS — D696 Thrombocytopenia, unspecified: Secondary | ICD-10-CM

## 2024-08-18 DIAGNOSIS — E1165 Type 2 diabetes mellitus with hyperglycemia: Secondary | ICD-10-CM

## 2024-08-18 DIAGNOSIS — I619 Nontraumatic intracerebral hemorrhage, unspecified: Secondary | ICD-10-CM | POA: Diagnosis not present

## 2024-08-18 LAB — BASIC METABOLIC PANEL WITH GFR
Anion gap: 14 (ref 5–15)
BUN: 145 mg/dL — ABNORMAL HIGH (ref 8–23)
CO2: 20 mmol/L — ABNORMAL LOW (ref 22–32)
Calcium: 8.7 mg/dL — ABNORMAL LOW (ref 8.9–10.3)
Chloride: 114 mmol/L — ABNORMAL HIGH (ref 98–111)
Creatinine, Ser: 4.82 mg/dL — ABNORMAL HIGH (ref 0.61–1.24)
GFR, Estimated: 12 mL/min — ABNORMAL LOW (ref >60)
Glucose, Bld: 136 mg/dL — ABNORMAL HIGH (ref 70–99)
Potassium: 4.7 mmol/L (ref 3.5–5.1)
Sodium: 148 mmol/L — ABNORMAL HIGH (ref 135–145)

## 2024-08-18 LAB — CBC WITH DIFFERENTIAL/PLATELET
Abs Immature Granulocytes: 0.01 K/uL (ref 0.00–0.07)
Basophils Absolute: 0 K/uL (ref 0.0–0.1)
Basophils Relative: 1 %
Eosinophils Absolute: 0.1 K/uL (ref 0.0–0.5)
Eosinophils Relative: 2 %
HCT: 34.7 % — ABNORMAL LOW (ref 39.0–52.0)
Hemoglobin: 11.3 g/dL — ABNORMAL LOW (ref 13.0–17.0)
Immature Granulocytes: 0 %
Lymphocytes Relative: 30 %
Lymphs Abs: 1 K/uL (ref 0.7–4.0)
MCH: 30.9 pg (ref 26.0–34.0)
MCHC: 32.6 g/dL (ref 30.0–36.0)
MCV: 94.8 fL (ref 80.0–100.0)
Monocytes Absolute: 0.4 K/uL (ref 0.1–1.0)
Monocytes Relative: 12 %
Neutro Abs: 1.8 K/uL (ref 1.7–7.7)
Neutrophils Relative %: 55 %
Platelets: 113 K/uL — ABNORMAL LOW (ref 150–400)
RBC: 3.66 MIL/uL — ABNORMAL LOW (ref 4.22–5.81)
RDW: 12.9 % (ref 11.5–15.5)
WBC: 3.2 K/uL — ABNORMAL LOW (ref 4.0–10.5)
nRBC: 0 % (ref 0.0–0.2)

## 2024-08-18 LAB — TECHNOLOGIST SMEAR REVIEW: Plt Morphology: NORMAL

## 2024-08-18 LAB — SODIUM
Sodium: 147 mmol/L — ABNORMAL HIGH (ref 135–145)
Sodium: 145 mmol/L (ref 135–145)

## 2024-08-18 LAB — GLUCOSE, CAPILLARY
Glucose-Capillary: 123 mg/dL — ABNORMAL HIGH (ref 70–99)
Glucose-Capillary: 123 mg/dL — ABNORMAL HIGH (ref 70–99)
Glucose-Capillary: 141 mg/dL — ABNORMAL HIGH (ref 70–99)
Glucose-Capillary: 149 mg/dL — ABNORMAL HIGH (ref 70–99)
Glucose-Capillary: 155 mg/dL — ABNORMAL HIGH (ref 70–99)
Glucose-Capillary: 157 mg/dL — ABNORMAL HIGH (ref 70–99)

## 2024-08-18 LAB — MAGNESIUM: Magnesium: 3.3 mg/dL — ABNORMAL HIGH (ref 1.7–2.4)

## 2024-08-18 LAB — PHOSPHORUS: Phosphorus: 4.1 mg/dL (ref 2.5–4.6)

## 2024-08-18 LAB — AMMONIA: Ammonia: 30 umol/L (ref 9–35)

## 2024-08-18 MED ORDER — FREE WATER
250.0000 mL | Status: DC
  Administered 2024-08-18 – 2024-08-21 (×18): 250 mL

## 2024-08-18 MED ORDER — PIPERACILLIN-TAZOBACTAM 3.375 G IVPB
3.3750 g | Freq: Three times a day (TID) | INTRAVENOUS | Status: DC
  Administered 2024-08-18: 3.375 g via INTRAVENOUS
  Filled 2024-08-18: qty 50

## 2024-08-18 MED ORDER — PIPERACILLIN-TAZOBACTAM IN DEX 2-0.25 GM/50ML IV SOLN
2.2500 g | Freq: Four times a day (QID) | INTRAVENOUS | Status: DC
  Administered 2024-08-18 – 2024-08-19 (×4): 2.25 g via INTRAVENOUS
  Filled 2024-08-18 (×5): qty 50

## 2024-08-18 NOTE — Consult Note (Signed)
 NAME:  Alec [name], MRN:  [id number], DOB:  [birth year], LOS: 8 ADMISSION DATE:  08/10/2024, CONSULTATION DATE: 11/18  REFERRING MD:  MD Cindy CHIEF COMPLAINT:  AMS   History of Present Illness:  Patient is a 78 year old male with significant past medical history of hypertension, hyperlipidemia, CAD, CHF, DM 2-insulin -dependent, COPD, CKD stage IV, and thrombocytopenia who was admitted on 11/11 and found to have a left-sided subcortical ICH suspected due to hypertension with TRH managing and neurology following.  Per report, patient having periods of agitation, intermittent command following, but having dysarthric speech. Overnight on 11/17 into 11/18, patient started having increase in respiratory rate which continued to increase progressively.  TRH and respiratory therapy consulted PCCM for emergent consult in the setting of respiratory distress and worsening mental status.  Upon arrival to bedside, patient obtunded, tachypneic respiratory rate 30s to 40s, and having significant expiratory wheezing.  Patient unable to protect airway.  Wife updated at bedside about emergent intubation and stat CT of head.  Pertinent  Medical History   Past Medical History:  Diagnosis Date   Anemia    Anxiety    Aortic insufficiency    Arthritis    Bilateral lower extremity edema    Chronic gout    06-19-2020  per pt last episode 6 months , great toe   CKD (chronic kidney disease), stage IV [facility])    Coronary artery disease cardiologist--- dr hochrein   CABG 2019   Degenerative arthritis of shoulder region 08/2013   left   History of bladder cancer 07/2008   s/p  TURBT   History of cellulitis 11/2017   left upper arm   History of kidney stones    Hyperlipidemia    Hypertension    followed by pcp/ cardiology   IDDM (insulin  dependent diabetes mellitus)    endocrinologist--- dr von---  pt uses insulin  pump and dexcom  (06-19-2020 per pt fasting sugar-- 98--110)   Insulin  pump in place    Mitral  regurgitation    OSA (obstructive sleep apnea)    Prostate cancer [facility]) urologist--- dr herrick/  oncologist--- manning   dx 11/ 2017  Gleason 3+3 active survillance;  bx 03-14-2020 Gleason 3+4   RA (rheumatoid arthritis) (HCC)    rhemotologist--- dr donnis---     S/P CABG x 3 07/15/2018   LIMA to LAD;  SVG to PDA;  SVG to RI   Thrombocytopenia      Significant Hospital Events: Including procedures, antibiotic start and stop dates in addition to other pertinent events   11/11 left-sided subcortical ICH 11/18 PCCM emergent consult for respiratory distress/decreased mental status; intubated  Interim History / Subjective:  NAEO. Patient intubated on dexmedetomidine . SBT failed d/t apnea. Reduced RR from 24 to 10 on vent, patient triggered breaths. Dex stopped to optimize his neuro status. If overbreathing vent, will flip to PSV. Will remain intubated today d/t worsening uremia. Starting [facility] 250q4 for FWD 3L corrected over 48 hours. Nephrology following, not recommending dialysis at this moment. If uremia continues to worsen despite fluid correction, will likely need dialysis.  Objective    Blood pressure (!) 91/50, pulse 60, temperature (!) 96.8 F (36 C), temperature source Axillary, resp. rate 20, height 5' 6 (1.676 m), weight 88.9 kg, SpO2 99%.    Vent Mode: PRVC FiO2 (%):  [40 %] 40 % Set Rate:  [24 bmp] 24 bmp Vt Set:  [510 mL] 510 mL PEEP:  [5 cmH20] 5 cmH20 Plateau Pressure:  [15 cmH20-19  cmH20] 18 cmH20   Intake/Output Summary (Last 24 hours) at 08/18/2024 1050 Last data filed at 08/18/2024 1000 Gross per 24 hour  Intake 3172.75 ml  Output 1417 ml  Net 1755.75 ml   Filed Weights   08/14/24 0500 08/16/24 0500 08/18/24 9297  Weight: 87.5 kg 88.9 kg 88.9 kg   General: chronically-ill male, in NAD, intubated on Dex @ 0.6 HEENT: AT/Nenzel, PERRL, 3mm bilaterally, mmm Pulm: ETT, ventilator-assisted breaths, mildly coarse bilaterally CV: RRR, no m/g/r GI: soft, non  distended Neuro: GCS 10TS E3 V1 M6, follows commands in LUE/LLE, withdraws RLE, no movement seen on RUE  Resolved problem list  Hypotension-sedation related peri-intubation Hyperkalemia Assessment and Plan  Left ICH secondary to hypertension-11/11 Acute metabolic encephalopathy AMS Agitation/delirium CT of head 11/11 3.0 x 2.8 x 2.6 cm (estimated volume 11 mL) acute intraparenchymal hemorrhage centered at the left basal ganglia.  CT head 11/14 No significant interval change in size and morphology of intraparenchymal hemorrhage centered at the left basal ganglia. Surrounding vasogenic edema with regional mass effect and 4 mm of left-to-right shift, not appreciably changed. MRI 11/13 Left basal ganglia intraparenchymal hemorrhage measuring approximately 4.4 x 3.5 x 3.8 cm with moderate surrounding vasogenic edema and fluid-fluid level from hematocrit effect, similar to the prior exam. Mild rightward shift of midline structures by approximately 4 mm P: -STAT CT of head w/o contrast- unchanged, no cerebral edema  -Continue neuroprotective measures: normothermia, normoxia, normocapnia, euvolemia, monitor and keep electrolytes in normal limits  -Dex stopped to evaluate neuro exam -Ammonia normal at 30 -Continue frequent neuro checks  -Delirium precautions  -Aspiration precautions   Acute Respiratory Failure  Inability to Protect Airway COPD hx  Emergent intubation 11/18 P:  -Continue ventilator support and lung protective strategies  -Continue LTVV; Wean PEEP and Fio2 requirements to sat goal of >92%; HOB > 30 degrees; Plat < 30; Aim for Driving pressures < 15  -Intermittent Chest X-ray and ABGS -Tracheal aspirate pending-sample mixed w/ epithelial cells; likely contaminant;reincubated -VAP and PAD protocols in place  -Wean sedation as tolerated, SBT and WUA daily - fentanyl  prn  -Empirically started on zosyn  for c/f aspiration -Triple BD ordered -Trend WBC, fever curve   AKI on  CKD Stage IV  Uremia Hypernatremia Worsening Creatinine, renal function  P: -Nephrology following; hold of dialysis at the moment; uremia likely volume related -Continue to trend renal function daily  -Continue to monitor and optimize electrolytes daily -Continue to monitor urine output -Continue strict I/Os -Continue Adequate renal perfusion  -Avoid nephrotoxic agents  -Renal ultrasound w/ right renal cyst (present for 17 years/likely benign) -Foley in situ; goal uop 0.48ml/kg/hr -Continue NS 0.45 -Started [facility] 250 q4 for FWD 3L; correct over 48 hours; goal normonatremia  HTN HLD  Hypotension-resolved off pressors P: -Norvasc , hydral, coreg  held for hypotension; now resolved will restart these medications should he become hypertensive -Hydralazine  PRN -Continue statin   DM2 Hyperglycemia  Not controlled  P: -Continue SSI, CBG q4 -Continue lantus  35 units daily   Thrombocytopenia Hgb 11.5, plts 125 P: -CT head unchanged, no cerebral edema  -Continue to trend CBC- h/h/ plts daily    Labs   CBC: Recent Labs  Lab 08/15/24 0315 08/16/24 0206 08/17/24 1014 08/17/24 1220 08/17/24 1249 08/18/24 0243  WBC 3.6* 3.6* 5.0  --  3.1* 3.2*  NEUTROABS  --   --   --   --   --  1.8  HGB 10.8* 11.3* 11.5* 9.9* 10.5* 11.3*  HCT 33.1* 34.4*  35.6* 29.0* 32.6* 34.7*  MCV 95.1 94.5 96.0  --  96.2 94.8  PLT 100* 120* 125*  --  114* 113*    Basic Metabolic Panel: Recent Labs  Lab 08/11/24 1942 08/11/24 1942 08/12/24 0612 08/13/24 0349 08/14/24 0234 08/15/24 0315 08/16/24 0206 08/17/24 1014 08/17/24 1220 08/17/24 1249 08/18/24 0243  NA  --    < > 135 140 141 142 145 149* 150* 149* 148*  K  --    < > 4.6 4.5 4.9 5.0 5.0 6.2* 5.2* 5.3* 4.7  CL  --    < > 107 110 113* 111 115* 116*  --  116* 114*  CO2  --    < > 18* 18* 18* 20* 19* 21*  --  24 20*  GLUCOSE  --    < > 182* 172* 198* 241* 217* 287*  --  245* 136*  BUN  --    < > 49* 56* 69* 90* 107* 141*  --  143* 145*   CREATININE  --    < > 3.54* 3.38* 3.47* 3.58* 3.73* 4.59*  --  4.57* 4.82*  CALCIUM   --    < > 8.7* 9.0 9.1 9.2 9.3 9.0  --  8.6* 8.7*  MG 2.2  --  2.5* 2.4 2.6*  --  2.9*  --   --   --  3.3*  PHOS 3.8  --  3.4 3.7 3.3  --   --   --   --   --  4.1   < > = values in this interval not displayed.   GFR: Estimated Creatinine Clearance: 13.6 mL/min (A) (by C-G formula based on SCr of 4.82 mg/dL (H)). Recent Labs  Lab 08/16/24 0206 08/17/24 1014 08/17/24 1249 08/18/24 0243  WBC 3.6* 5.0 3.1* 3.2*  LATICACIDVEN  --   --  1.2  --     Liver Function Tests: Recent Labs  Lab 08/16/24 0206 08/17/24 1014  AST 22 37  ALT 25 40  ALKPHOS 71 82  BILITOT 0.9 0.8  PROT 7.1 7.2  ALBUMIN  3.1* 3.0*   No results for input(s): LIPASE, AMYLASE in the last 168 hours. Recent Labs  Lab 08/18/24 0855  AMMONIA 30   ABG    Component Value Date/Time   PHART 7.382 08/17/2024 1220   PCO2ART 38.9 08/17/2024 1220   PO2ART 351 (H) 08/17/2024 1220   HCO3 23.1 08/17/2024 1220   TCO2 24 08/17/2024 1220   ACIDBASEDEF 2.0 08/17/2024 1220   O2SAT 100 08/17/2024 1220    Coagulation Profile: Recent Labs  Lab 08/17/24 1249  INR 1.2   Cardiac Enzymes: No results for input(s): CKTOTAL, CKMB, CKMBINDEX, TROPONINI in the last 168 hours.  HbA1C: Hemoglobin A1C  Date/Time Value Ref Range Status  05/13/2024 08:43 AM 6.1 (A) 4.0 - 5.6 % Final  12/09/2019 12:00 AM 6.7  Final   Hgb A1c MFr Bld  Date/Time Value Ref Range Status  08/12/2024 06:12 AM 6.0 (H) 4.8 - 5.6 % Final    Comment:    (NOTE) Diagnosis of Diabetes The following HbA1c ranges recommended by the American Diabetes Association (ADA) may be used as an aid in the diagnosis of diabetes mellitus.  Hemoglobin             Suggested A1C NGSP%              Diagnosis  <5.7  Non Diabetic  5.7-6.4                Pre-Diabetic  >6.4                   Diabetic  <7.0                   Glycemic control for                        adults with diabetes.    01/15/2024 08:40 AM 6.1 (H) <5.7 % Final    Comment:    For someone without known diabetes, a hemoglobin  A1c value between 5.7% and 6.4% is consistent with prediabetes and should be confirmed with a  follow-up test. . For someone with known diabetes, a value <7% indicates that their diabetes is well controlled. A1c targets should be individualized based on duration of diabetes, age, comorbid conditions, and other considerations. . This assay result is consistent with an increased risk of diabetes. . Currently, no consensus exists regarding use of hemoglobin A1c for diagnosis of diabetes for children. .    CBG: Recent Labs  Lab 08/17/24 1612 08/17/24 1920 08/17/24 2332 08/18/24 0334 08/18/24 0747  GLUCAP 174* 146* 116* 123* 123*   Review of Systems:   See HPI   Past Medical History:  He,  has a past medical history of Anemia, Anxiety, Aortic insufficiency, Arthritis, Bilateral lower extremity edema, Chronic gout, CKD (chronic kidney disease), stage IV (HCC), Coronary artery disease (cardiologist--- dr lavona), Degenerative arthritis of shoulder region (08/2013), History of bladder cancer (07/2008), History of cellulitis (11/2017), History of kidney stones, Hyperlipidemia, Hypertension, IDDM (insulin  dependent diabetes mellitus), Insulin  pump in place, Mitral regurgitation, OSA (obstructive sleep apnea), Prostate cancer [facility]) (urologist--- dr herrick/  oncologist--- manning), RA (rheumatoid arthritis) (HCC), S/P CABG x 3 (07/15/2018), and Thrombocytopenia.   Surgical History:   Past Surgical History:  Procedure Laterality Date   CHOLECYSTECTOMY  08/29/2012   Procedure: LAPAROSCOPIC CHOLECYSTECTOMY WITH INTRAOPERATIVE CHOLANGIOGRAM;  Surgeon: Donnice POUR. Tsuei, MD;  Location: [facility];  Service: General;  Laterality: N/A;   CORONARY ARTERY BYPASS GRAFT N/A 07/20/2018   Procedure: CORONARY ARTERY BYPASS GRAFTING (CABG) times three  using left internal mammary artery to the LAD, and using endoscopically harvested right saphenous vein to PDA and intermedius.;  Surgeon: Army Dallas NOVAK, MD;  Location: [facility] OR;  Service: Open Heart Surgery;  Laterality: N/A;   LEFT HEART CATH AND CORONARY ANGIOGRAPHY N/A 07/15/2018   Procedure: LEFT HEART CATH AND CORONARY ANGIOGRAPHY;  Surgeon: Anner Alm ORN, MD;  Location: [facility] INVASIVE CV LAB;  Service: Cardiovascular;  Laterality: N/A;   RADIOACTIVE SEED IMPLANT N/A 06/21/2020   Procedure: RADIOACTIVE SEED IMPLANT/BRACHYTHERAPY IMPLANT;  Surgeon: Cam Morene ORN, MD;  Location: [facility];  Service: Urology;  Laterality: N/A;   SHOULDER ARTHROSCOPY W/ ROTATOR CUFF REPAIR Right 09/03/2013   SHOULDER ARTHROSCOPY WITH SUBACROMIAL DECOMPRESSION, ROTATOR CUFF REPAIR AND BICEP TENDON REPAIR Left 09/03/2013   Procedure: LEFT SHOULDER ARTHROSCOPY WITH EXTENSIVE DEBRIDMENT, DISTAL CLAVICULECTOMY, ROTATOR CUFF REPAIR AND SUBACROMIAL DECOMPRESSION PARTIAL ACRIOMIOPLASTY WITH CORACOACROMIAL RELEASE;  Surgeon: Evalene JONETTA Chancy, MD;  Location: [facility];  Service: Orthopedics;  Laterality: Left;   SPACE OAR INSTILLATION N/A 06/21/2020   Procedure: SPACE OAR INSTILLATION;  Surgeon: Cam Morene ORN, MD;  Location: [facility];  Service: Urology;  Laterality: N/A;   TEE WITHOUT CARDIOVERSION N/A 07/20/2018   Procedure: TRANSESOPHAGEAL ECHOCARDIOGRAM (  TEE);  Surgeon: Army Dallas NOVAK, MD;  Location: [facility] OR;  Service: Open Heart Surgery;  Laterality: N/A;   TRANSURETHRAL RESECTION OF BLADDER TUMOR WITH MITOMYCIN -C  08/24/2008   @[facility]      Social History:   reports that he quit smoking about 26 years ago. His smoking use included cigarettes. He started smoking about 51 years ago. He has a 50 pack-year smoking history. He has never used smokeless tobacco. He reports that he does not currently use alcohol. He reports that he does not use drugs.   Family  History:  His family history includes CAD in his maternal grandfather and paternal grandfather; Cancer in his father; Coronary artery disease in his father; Diabetes in his mother; Lung cancer in his father and mother; Sudden death in his son. There is no history of Breast cancer, Colon cancer, or Pancreatic cancer.   Allergies No Active Allergies   Home Medications  Prior to Admission medications   Medication Sig Start Date End Date Taking? Authorizing Provider  albuterol  (VENTOLIN  HFA) 108 (90 Base) MCG/ACT inhaler Inhale 2 puffs into the lungs every 6 (six) hours as needed for wheezing or shortness of breath. 06/28/24  Yes Charley Conger, PA-C  allopurinol  (ZYLOPRIM ) 100 MG tablet Take 0.5 tablets (50 mg total) by mouth daily. 03/19/23 08/10/24 Yes Kathrin Mignon DASEN, MD  aspirin  EC 81 MG tablet Take 81 mg by mouth daily.   Yes [provider]  atorvastatin  (LIPITOR) 40 MG tablet TAKE 1 TABLET BY MOUTH EVERY DAY 04/23/24  Yes Lavona Agent, MD  Boswellia-Glucosamine-Vit D (OSTEO BI-FLEX ONE PER DAY PO) Take 1 tablet by mouth daily.   Yes [provider]  Cholecalciferol  (VITAMIN D3) 50 MCG (2000 UT) TABS Take 1 tablet by mouth daily.   Yes [provider]  CINNAMON PO Take 500 mg by mouth daily.   Yes [provider]  furosemide  (LASIX ) 20 MG tablet Take 1 tablet (20 mg total) by mouth daily. 04/12/24  Yes Lavona Agent, MD  gabapentin  (NEURONTIN ) 100 MG capsule Take 1 capsule (100 mg total) by mouth 3 (three) times daily as needed (pain). Patient taking differently: Take 100 mg by mouth at bedtime. 05/16/23  Yes Long, Fonda MATSU, MD  hydrALAZINE  (APRESOLINE ) 100 MG tablet Take 1 tablet (100 mg total) by mouth 3 (three) times daily. Patient taking differently: Take 50-100 mg by mouth See admin instructions. 1 tab in the morning, 1 tab at noon, and 2 at bedtime 03/29/22  Yes Ricky Fines, MD  insulin  aspart (NOVOLOG ) 100 UNIT/ML injection USE A MAX OF 100 UNITS DAILY  VIA INSULIN  PUMP 05/13/24  Yes Thapa, Sudan, MD  isosorbide  mononitrate (IMDUR ) 120 MG 24 hr tablet TAKE 1 TABLET BY MOUTH EVERY DAY 01/07/24  Yes Lavona Agent, MD  latanoprost  (XALATAN ) 0.005 % ophthalmic solution Place 1 drop into both eyes at bedtime. 09/18/21  Yes [provider]  Multiple Vitamins-Minerals (CENTRUM SILVER PO) Take 1 tablet by mouth daily.   Yes [provider]  nitroGLYCERIN  (NITROSTAT ) 0.4 MG SL tablet Place 1 tablet (0.4 mg total) under the tongue every 5 (five) minutes as needed for chest pain. 10/26/21 08/10/24 Yes Lavona Agent, MD  olmesartan  (BENICAR ) 5 MG tablet Take 1 tablet (5 mg total) by mouth daily. 03/24/23  Yes Gonfa, Taye T, MD  sertraline  (ZOLOFT ) 100 MG tablet TAKE 1 TABLET BY MOUTH EVERY DAY Patient taking differently: Take 50 mg by mouth daily. 04/09/24  Yes Lavona Agent, MD  tamsulosin  (FLOMAX ) 0.4  MG CAPS capsule Take 0.4 mg by mouth at bedtime.   Yes [provider]  Insulin  Human (INSULIN  PUMP) SOLN Inject into the skin.    [provider]  prednisoLONE acetate (PRED FORTE) 1 % ophthalmic suspension SMARTSIG:In Eye(s) Patient not taking: Reported on 08/10/2024 08/06/24   [provider]     Critical care time: 35 minutes    Murlean Seelye, DNP, AGACNP-BC [facility]  Please see [email or web address] for pager details.  From 7A-7P if no response, please call [phone]. After hours, please call ELink [phone].

## 2024-08-18 NOTE — TOC Progression Note (Signed)
 Transition of Care [facility]) - Progression Note    Patient Details  Name: Alec [name] MRN: [id number] Date of Birth: 09/11/1946  [facility] CM/SW Contact  Inocente GORMAN Kindle, LCSW Phone Number: 08/18/2024, 9:30 AM  Clinical Narrative:    CSW continuing to follow for disposition needs. Patient intubated remains with [facility] in the ICU.   Expected Discharge Plan: IP Rehab Facility                 Expected Discharge Plan and Services                                               Social Drivers of Health (SDOH) Interventions SDOH Screenings   Food Insecurity: Patient Unable To Answer (08/13/2024)  Housing: Patient Unable To Answer (08/13/2024)  Transportation Needs: Patient Unable To Answer (08/13/2024)  Utilities: Patient Unable To Answer (08/13/2024)  Depression (PHQ2-9): Low Risk  (02/27/2023)  Social Connections: Patient Unable To Answer (08/13/2024)  Tobacco Use: Medium Risk (08/10/2024)    Readmission Risk Interventions    03/17/2023    3:00 PM 03/29/2022    2:24 PM  Readmission Risk Prevention Plan  Transportation Screening Complete Complete  PCP or Specialist Appt within 5-7 Days  Complete  PCP or Specialist Appt within 3-5 Days Complete   Home Care Screening  Complete  Medication Review (RN CM)  Complete  HRI or Home Care Consult Complete   Palliative Care Screening Not Applicable   Medication Review (RN Care Manager) Complete

## 2024-08-18 NOTE — Progress Notes (Signed)
 Nephrology Follow-Up Consult note   Assessment/Recommendations: Alec [name] is a/an 78 y.o. male with a past medical history significant for CAD, HTN, DM2, COPD, CKD 4 who presents with ICH complicated by AKI on CKD, hypernatremia, altered mental status   Severe AKI on CKD 4: Baseline creatinine around 4.  Possibly some degree of uremia with BUN of 141.  BUN seems to have risen quickly.  This could partially be related to increased protein nutrition but mainly dehydration given concomitant elevated sodium and likely diminished access to free water  Creatinine continues to worsen.  Urine output decent - Agree with IV fluids, on half-normal saline for now.  Would change to D5 water if the patient continues to have hypernatremia -Increase enteral free water as able -Continue to monitor daily Cr, Dose meds for GFR -Monitor Daily I/Os, Daily weight  -Maintain MAP>65 for optimal renal perfusion.  -Avoid nephrotoxic medications including NSAIDs -Use synthetic opioids (Fentanyl /Dilaudid ) if needed - Briefly discussed potential for dialysis with the patient's wife.  Continue to monitor progress.  Patient is not a good long-term dialysis candidate because of his recent stroke.   Severe altered mental status: Likely multifactorial with stroke contributing.  Uremia also contributing to some degree.  Treatment of AKI as above.  Other management per primary team   Hyponatremia: Associated with intracranial pathology and decreased access to free water.  Continue IV fluids as above.  Increase enteral free water as able   Shock: Likely vasoplegic.  Now off norepinephrine .  Utilize if needed   Hyperkalemia: Associated with AKI.  Now resolved   Acute hypoxic respiratory failure: Likely related to altered mental status and inability to protect airway.  Now intubated.  Management per CCM   Uncontrolled type 2 diabetes with hyperglycemia: Management per primary team.  Titrate insulin  as  needed.   Recommendations conveyed to primary service.    [facility] 08/18/2024 10:41 AM  ___________________________________________________________  CC: Right sided weakness  Interval History/Subjective: No acute issues overnight.  Patient sedated and not interactive   Medications:  Current Facility-Administered Medications  Medication Dose Route Frequency Provider Last Rate Last Admin   0.45 % sodium chloride  infusion   Intravenous Continuous Dagenhart, Jamie H, NP 150 mL/hr at 08/18/24 1000 Infusion Verify at 08/18/24 1000   0.9 %  sodium chloride  infusion  250 mL Intravenous Continuous Smith, Joshua C, NP   Stopped at 08/18/24 9273   acetaminophen  (TYLENOL ) tablet 650 mg  650 mg Oral Q4H PRN Michaela Aisha SQUIBB, MD       Or   acetaminophen  (TYLENOL ) 160 MG/5ML solution 650 mg  650 mg Per Tube Q4H PRN Michaela Aisha SQUIBB, MD   650 mg at 08/18/24 9083   Or   acetaminophen  (TYLENOL ) suppository 650 mg  650 mg Rectal Q4H PRN Michaela Aisha SQUIBB, MD       allopurinol  (ZYLOPRIM ) tablet 50 mg  50 mg Per Tube Daily Jerri Pfeiffer, MD   50 mg at 08/18/24 0916   arformoterol (BROVANA) nebulizer solution 15 mcg  15 mcg Nebulization BID Smith, Joshua C, NP   15 mcg at 08/18/24 0804   atorvastatin  (LIPITOR) tablet 40 mg  40 mg Per Tube Daily Jerri Pfeiffer, MD   40 mg at 08/18/24 0916   budesonide (PULMICORT) nebulizer solution 0.25 mg  0.25 mg Nebulization BID Smith, Joshua C, NP   0.25 mg at 08/18/24 0804   Chlorhexidine  Gluconate Cloth 2 % PADS 6 each  6 each Topical Q0600  Michaela Aisha SQUIBB, MD   6 each at 08/18/24 0549   dexmedetomidine  (PRECEDEX ) 400 MCG/100ML (4 mcg/mL) infusion  0-0.7 mcg/kg/hr Intravenous Titrated Smith, Joshua C, NP   Stopped at 08/18/24 0857   docusate (COLACE) 50 MG/5ML liquid 100 mg  100 mg Per Tube BID Smith, Joshua C, NP   100 mg at 08/18/24 0916   feeding supplement (GLUCERNA 1.5 CAL) liquid 1,000 mL  1,000 mL Per  Tube Continuous Xu, Jindong, MD   Stopped at 08/17/24 1230   feeding supplement (PROSource TF20) liquid 60 mL  60 mL Per Tube Daily Jerri Pfeiffer, MD   60 mL at 08/18/24 0916   fentaNYL  (SUBLIMAZE ) injection 50 mcg  50 mcg Intravenous Q15 min PRN Smith, Joshua C, NP   50 mcg at 08/17/24 1820   fentaNYL  (SUBLIMAZE ) injection 50-200 mcg  50-200 mcg Intravenous Q30 min PRN Smith, Joshua C, NP   50 mcg at 08/17/24 1259   free water 250 mL  250 mL Per Tube Q4H Dagenhart, Jamie H, NP   250 mL at 08/18/24 0918   heparin  injection 5,000 Units  5,000 Units Subcutaneous Q8H Xu, Jindong, MD   5,000 Units at 08/18/24 0549   hydrALAZINE  (APRESOLINE ) injection 20 mg  20 mg Intravenous Q4H PRN Remi Pippin, NP   20 mg at 08/15/24 9192   insulin  aspart (novoLOG ) injection 0-20 Units  0-20 Units Subcutaneous Q4H Smith, Joshua C, NP   3 Units at 08/18/24 0840   insulin  glargine-yfgn (SEMGLEE ) injection 35 Units  35 Units Subcutaneous Daily Cindy Garnette POUR, MD   35 Units at 08/18/24 0931   ipratropium-albuterol  (DUONEB) 0.5-2.5 (3) MG/3ML nebulizer solution 3 mL  3 mL Nebulization Q4H PRN Cindy Garnette POUR, MD   3 mL at 08/17/24 0955   labetalol (NORMODYNE) injection 40 mg  40 mg Intravenous Q4H PRN Shafer, Devon, NP   40 mg at 08/17/24 0407   latanoprost  (XALATAN ) 0.005 % ophthalmic solution 1 drop  1 drop Both Eyes QHS Jerri Pfeiffer, MD   1 drop at 08/17/24 2309   norepinephrine  (LEVOPHED ) 4mg  in (0.016 mg/mL) premix infusion  0-10 mcg/min Intravenous Titrated Smith, Joshua C, NP   Stopped at 08/18/24 0521   Oral care mouth rinse  15 mL Mouth Rinse Q2H Chand, Sudham, MD   15 mL at 08/18/24 0931   Oral care mouth rinse  15 mL Mouth Rinse PRN Chand, Sudham, MD       pantoprazole  (PROTONIX ) injection 40 mg  40 mg Intravenous QHS Michaela Aisha SQUIBB, MD   40 mg at 08/17/24 2223   piperacillin -tazobactam (ZOSYN ) IVPB 3.375 g  3.375 g Intravenous Q8H Fobbs, Rodericks T, RPH       polyethylene glycol (MIRALAX  /  GLYCOLAX ) packet 17 g  17 g Per Tube Daily Smith, Joshua C, NP       QUEtiapine (SEROQUEL) tablet 25 mg  25 mg Per Tube QHS Jerri Pfeiffer, MD   25 mg at 08/17/24 2223   senna-docusate (Senokot-S) tablet 1 tablet  1 tablet Per Tube BID Fobbs, Rodericks T, RPH   1 tablet at 08/18/24 0916   sertraline  (ZOLOFT ) tablet 50 mg  50 mg Per Tube Daily Marten Larraine HERO, RPH   50 mg at 08/18/24 9083      Review of Systems: Unable to obtain due to the patient's sedation  Physical Exam: Vitals:   08/18/24 0900 08/18/24 1000  BP: 120/65 (!) 91/50  Pulse: 62 60  Resp: 11 20  Temp:    SpO2: 96% 99%   Total I/O In: 796.2 [I.V.:496.2; NG/GT:250; IV Piggyback:50] Out: 125 [Urine:125]  Intake/Output Summary (Last 24 hours) at 08/18/2024 1041 Last data filed at 08/18/2024 1000 Gross per 24 hour  Intake 3172.75 ml  Output 1417 ml  Net 1755.75 ml   Constitutional: Lying in bed, sedated, no distress ENMT: ears and nose without scars or lesions, MMM CV: normal rate, no edema Respiratory: Bilateral chest rise, ventilated, clear anteriorly Gastrointestinal: soft, non-tender, no palpable masses or hernias Skin: no visible lesions or rashes Psych: Sedated not interactive   Test Results I personally reviewed new and old clinical labs and radiology tests Lab Results  Component Value Date   NA 148 (H) 08/18/2024   K 4.7 08/18/2024   CL 114 (H) 08/18/2024   CO2 20 (L) 08/18/2024   BUN 145 (H) 08/18/2024   CREATININE 4.82 (H) 08/18/2024   GFR 23.26 (L) 07/10/2023   CALCIUM  8.7 (L) 08/18/2024   ALBUMIN  3.0 (L) 08/17/2024   PHOS 4.1 08/18/2024    CBC Recent Labs  Lab 08/17/24 1014 08/17/24 1220 08/17/24 1249 08/18/24 0243  WBC 5.0  --  3.1* 3.2*  NEUTROABS  --   --   --  1.8  HGB 11.5* 9.9* 10.5* 11.3*  HCT 35.6* 29.0* 32.6* 34.7*  MCV 96.0  --  96.2 94.8  PLT 125*  --  114* 113*

## 2024-08-18 NOTE — Progress Notes (Signed)
 Nutrition Follow-up  DOCUMENTATION CODES:   Obesity unspecified  INTERVENTION:  ***  250  ml free water  every 4 hours (11/19) Total free water :     NUTRITION DIAGNOSIS:   Swallowing difficulty (failed swallow eval) related to acute illness (ICH) as evidenced by NPO status (necessitating enteral nutrition support). Ongoing.   GOAL:   Patient will meet greater than or equal to 90% of their needs Met with TF at goal   MONITOR:   TF tolerance, Diet advancement, Labs, Weight trends, I & O's  REASON FOR ASSESSMENT:   Consult Enteral/tube feeding initiation and management  ASSESSMENT:   Patient presented with R-sided weakness and aphasia and was found to have large subcortical hemorrhage. PMH significant for DM2, CHF, HTN, CAD s/p CABG 2019, dyslipidemia, CKD4, COPD, prostate and bladder cancer.  Pt discussed during ICU rounds and with RN and MD.   11/11 - admit for L subcortical ICH 11/18 - pt obtunded and intubated; TF held; Renal consulted for AKI on CKD stage IV started on IV fluids and fluid via Cortrak tube    Medications reviewed and include:  Colace BID, SSI every 4 hours, 35 units semglee  daily, protonix , miralax , senokot-s 1/2 NS @ 150 ml/hr   Labs reviewed:  Na 145 -> 149 -> 150 -> 149 -> 148 K 4.7 BUN 44 -> 49 -> 56 -> 69 -> 90 -> 107 -> 141 -> 143 -> 145 Phos 4.1 A1C 6.0 CBG's: 116 (TF off) - 217  Diet Order:   Diet Order             Diet NPO time specified  Diet effective now                   EDUCATION NEEDS:   Not appropriate for education at this time  Skin:  Skin Assessment: Reviewed RN Assessment  Last BM:  PTA  Height:   Ht Readings from Last 1 Encounters:  08/17/24 5' 6 (1.676 m)    Weight:   Wt Readings from Last 1 Encounters:  08/18/24 88.9 kg    Ideal Body Weight:  65 kg  BMI:  Body mass index is 31.63 kg/m.  Estimated Nutritional Needs:   Kcal:  1900-2100  Protein:  115-140  Fluid:  >1900 or per  Neuro    ***

## 2024-08-19 ENCOUNTER — Inpatient Hospital Stay (HOSPITAL_COMMUNITY)

## 2024-08-19 DIAGNOSIS — J9601 Acute respiratory failure with hypoxia: Secondary | ICD-10-CM | POA: Diagnosis not present

## 2024-08-19 DIAGNOSIS — Z4682 Encounter for fitting and adjustment of non-vascular catheter: Secondary | ICD-10-CM | POA: Diagnosis not present

## 2024-08-19 DIAGNOSIS — J449 Chronic obstructive pulmonary disease, unspecified: Secondary | ICD-10-CM | POA: Diagnosis not present

## 2024-08-19 DIAGNOSIS — G9341 Metabolic encephalopathy: Secondary | ICD-10-CM | POA: Diagnosis not present

## 2024-08-19 DIAGNOSIS — I619 Nontraumatic intracerebral hemorrhage, unspecified: Secondary | ICD-10-CM | POA: Diagnosis not present

## 2024-08-19 DIAGNOSIS — J9811 Atelectasis: Secondary | ICD-10-CM | POA: Diagnosis not present

## 2024-08-19 LAB — GLUCOSE, CAPILLARY
Glucose-Capillary: 171 mg/dL — ABNORMAL HIGH (ref 70–99)
Glucose-Capillary: 174 mg/dL — ABNORMAL HIGH (ref 70–99)
Glucose-Capillary: 193 mg/dL — ABNORMAL HIGH (ref 70–99)
Glucose-Capillary: 137 mg/dL — ABNORMAL HIGH (ref 70–99)
Glucose-Capillary: 148 mg/dL — ABNORMAL HIGH (ref 70–99)
Glucose-Capillary: 144 mg/dL — ABNORMAL HIGH (ref 70–99)

## 2024-08-19 LAB — CBC
HCT: 30.9 % — ABNORMAL LOW (ref 39.0–52.0)
Hemoglobin: 10.1 g/dL — ABNORMAL LOW (ref 13.0–17.0)
MCH: 31.4 pg (ref 26.0–34.0)
MCHC: 32.7 g/dL (ref 30.0–36.0)
MCV: 96 fL (ref 80.0–100.0)
Platelets: UNDETERMINED K/uL (ref 150–400)
RBC: 3.22 MIL/uL — ABNORMAL LOW (ref 4.22–5.81)
RDW: 12.7 % (ref 11.5–15.5)
WBC: 2.5 K/uL — ABNORMAL LOW (ref 4.0–10.5)
nRBC: 0 % (ref 0.0–0.2)

## 2024-08-19 LAB — MAGNESIUM: Magnesium: 2.9 mg/dL — ABNORMAL HIGH (ref 1.7–2.4)

## 2024-08-19 LAB — BASIC METABOLIC PANEL WITH GFR
Anion gap: 14 (ref 5–15)
BUN: 141 mg/dL — ABNORMAL HIGH (ref 8–23)
CO2: 18 mmol/L — ABNORMAL LOW (ref 22–32)
Calcium: 8.1 mg/dL — ABNORMAL LOW (ref 8.9–10.3)
Chloride: 112 mmol/L — ABNORMAL HIGH (ref 98–111)
Creatinine, Ser: 4.82 mg/dL — ABNORMAL HIGH (ref 0.61–1.24)
GFR, Estimated: 12 mL/min — ABNORMAL LOW (ref >60)
Glucose, Bld: 189 mg/dL — ABNORMAL HIGH (ref 70–99)
Potassium: 4.6 mmol/L (ref 3.5–5.1)
Sodium: 144 mmol/L (ref 135–145)

## 2024-08-19 LAB — CULTURE, RESPIRATORY W GRAM STAIN: Culture: NORMAL

## 2024-08-19 LAB — PHOSPHORUS: Phosphorus: 4.4 mg/dL (ref 2.5–4.6)

## 2024-08-19 MED ORDER — SODIUM CHLORIDE 0.9 % IV BOLUS: 500.0000 mL | Freq: Once | INTRAVENOUS | Status: DC

## 2024-08-19 MED ORDER — SODIUM CHLORIDE 0.9 % IV SOLN
3.0000 g | Freq: Two times a day (BID) | INTRAVENOUS | Status: AC
  Administered 2024-08-19 – 2024-08-23 (×8): 3 g via INTRAVENOUS
  Filled 2024-08-19 (×8): qty 8

## 2024-08-19 MED ORDER — SODIUM CHLORIDE 0.45 % IV SOLN: INTRAVENOUS | Status: DC

## 2024-08-19 NOTE — Progress Notes (Signed)
 NAME:  Alec [name], MRN:  [id number], DOB:  06/26/1946, LOS: 9 ADMISSION DATE:  08/10/2024, CONSULTATION DATE: 11/18  REFERRING MD:  MD Cindy CHIEF COMPLAINT:  AMS   History of Present Illness:  Patient is a 78 year old male with significant past medical history of hypertension, hyperlipidemia, CAD, CHF, DM 2-insulin -dependent, COPD, CKD stage IV, and thrombocytopenia who was admitted on 11/11 and found to have a left-sided subcortical ICH suspected due to hypertension with TRH managing and neurology following.  Per report, patient having periods of agitation, intermittent command following, but having dysarthric speech. Overnight on 11/17 into 11/18, patient started having increase in respiratory rate which continued to increase progressively.  TRH and respiratory therapy consulted PCCM for emergent consult in the setting of respiratory distress and worsening mental status.  Upon arrival to bedside, patient obtunded, tachypneic respiratory rate 30s to 40s, and having significant expiratory wheezing.  Patient unable to protect airway.  Wife updated at bedside about emergent intubation and stat CT of head.  Pertinent  Medical History   Past Medical History:  Diagnosis Date   Anemia    Anxiety    Aortic insufficiency    Arthritis    Bilateral lower extremity edema    Chronic gout    06-19-2020  per pt last episode 6 months , great toe   CKD (chronic kidney disease), stage IV [facility])    Coronary artery disease cardiologist--- dr hochrein   CABG 2019   Degenerative arthritis of shoulder region 08/2013   left   History of bladder cancer 07/2008   s/p  TURBT   History of cellulitis 11/2017   left upper arm   History of kidney stones    Hyperlipidemia    Hypertension    followed by pcp/ cardiology   IDDM (insulin  dependent diabetes mellitus)    endocrinologist--- dr von---  pt uses insulin  pump and dexcom  (06-19-2020 per pt fasting sugar-- 98--110)   Insulin  pump in place    Mitral  regurgitation    OSA (obstructive sleep apnea)    Prostate cancer [facility]) urologist--- dr herrick/  oncologist--- manning   dx 11/ 2017  Gleason 3+3 active survillance;  bx 03-14-2020 Gleason 3+4   RA (rheumatoid arthritis) (HCC)    rhemotologist--- dr donnis---     S/P CABG x 3 07/15/2018   LIMA to LAD;  SVG to PDA;  SVG to RI   Thrombocytopenia      Significant Hospital Events: Including procedures, antibiotic start and stop dates in addition to other pertinent events   11/11 left-sided subcortical ICH 11/18 PCCM emergent consult for respiratory distress/decreased mental status; intubated 11/19: Failed SBT d/t apnea and inadequate wakefulness; follows commands, frequently somnolent  Interim History / Subjective:  NAEO. Patient remains intubated off sedation. SBT failed this morning d/t apnea. RR increased overnight back to 24, unclear why, reduced to 16 during exam. Patient following commands, inadequate wakefulness w/ frequent somnolence precluding extubation. Sodium improved with FWF.  Objective    Blood pressure 134/62, pulse 77, temperature 98.3 F (36.8 C), temperature source Axillary, resp. rate 19, height 5' 6 (1.676 m), weight 93.2 kg, SpO2 100%.    Vent Mode: PRVC FiO2 (%):  [40 %] 40 % Set Rate:  [24 bmp] 24 bmp Vt Set:  [510 mL] 510 mL PEEP:  [5 cmH20] 5 cmH20 Plateau Pressure:  [10 cmH20-18 cmH20] 18 cmH20   Intake/Output Summary (Last 24 hours) at 08/19/2024 0859 Last data filed at 08/19/2024 0800 Gross per 24  hour  Intake 3561.6 ml  Output 1655 ml  Net 1906.6 ml   Filed Weights   08/16/24 0500 08/18/24 0702 08/19/24 0500  Weight: 88.9 kg 88.9 kg 93.2 kg   General: chronically-ill male, in NAD, intubated off sedation HEENT: AT/[place], PERRL, 3mm bilaterally, mmm Pulm: ETT, ventilator-assisted breaths, coarse bilaterally CV: RRR, no m/g/r GI: soft, rounded, nondistended Neuro: GCS 10TS E3 V1 M6, follows commands in LUE/BLE, flicker movement to RUE with  noxious stimuli  Resolved problem list  Hypotension-sedation related peri-intubation Hyperkalemia Assessment and Plan  Left ICH secondary to hypertension-11/11 Acute metabolic/uremic encephalopathy AMS Agitation/delirium CT of head 11/11 3.0 x 2.8 x 2.6 cm (estimated volume 11 mL) acute intraparenchymal hemorrhage centered at the left basal ganglia.  CT head 11/14 No significant interval change in size and morphology of intraparenchymal hemorrhage centered at the left basal ganglia. Surrounding vasogenic edema with regional mass effect and 4 mm of left-to-right shift, not appreciably changed. MRI 11/13 Left basal ganglia intraparenchymal hemorrhage measuring approximately 4.4 x 3.5 x 3.8 cm with moderate surrounding vasogenic edema and fluid-fluid level from hematocrit effect, similar to the prior exam. Mild rightward shift of midline structures by approximately 4 mm P: -STAT CT of head w/o contrast- unchanged, no cerebral edema  -Continue neuroprotective measures: normothermia, normoxia, normocapnia, euvolemia, monitor and keep electrolytes in normal limits  -Off sedation; limit sedating medications -BUN 141 (145) -Ammonia normal at 30 -Continue frequent neuro checks  -Delirium precautions  -Aspiration precautions   Acute Respiratory Failure  Inability to Protect Airway COPD hx  Emergent intubation 11/18 P:  -Continue ventilator support and lung protective strategies  -Continue LTVV; Wean PEEP and Fio2 requirements to sat goal of >92%; HOB > 30 degrees; Plat < 30; Aim for Driving pressures < 15  -Intermittent Chest X-ray and ABGS -Tracheal aspirate pending-sample mixed w/ epithelial cells; likely contaminant;reincubated -VAP and PAD protocols in place  -Limit sedation to aid in vent liberation -Empirically started on zosyn  for c/f aspiration -Triple BD ordered -Trend WBC, fever curve   AKI on CKD Stage IV  Uremia Hypernatremia Worsening Creatinine, renal function   P: -Nephrology following; hold of dialysis at the moment; uremia likely volume related -Continue to trend renal function daily; monitor uop -Continue to monitor and optimize electrolytes daily -Continue strict I/Os -Continue Adequate renal perfusion  -Avoid nephrotoxic agents  -Renal ultrasound w/ right renal cyst (present for 17 years/likely benign) -Foley in situ; goal uop 0.62ml/kg/hr -Hypernatremia improving with FWF; NS gtt discontinued -Started FWF 250 q4 for FWD 3L; correct over 48 hours; goal normonatremia  HTN HLD  Hypotension-resolved off pressors P: -Norvasc , hydral, coreg  held for hypotension; now resolved will restart these medications should he become hypertensive -Hydralazine  PRN -Continue statin   DM2 Hyperglycemia  Not controlled  P: -Continue SSI, CBG q4 -Continue lantus  35 units daily   Thrombocytopenia Hgb 11.5, plts 125 P: -CT head unchanged, no cerebral edema  -Continue to trend CBC- h/h/ plts daily    Labs   CBC: Recent Labs  Lab 08/16/24 0206 08/17/24 1014 08/17/24 1220 08/17/24 1249 08/18/24 0243 08/19/24 0235  WBC 3.6* 5.0  --  3.1* 3.2* 2.5*  NEUTROABS  --   --   --   --  1.8  --   HGB 11.3* 11.5* 9.9* 10.5* 11.3* 10.1*  HCT 34.4* 35.6* 29.0* 32.6* 34.7* 30.9*  MCV 94.5 96.0  --  96.2 94.8 96.0  PLT 120* 125*  --  114* 113* PLATELET CLUMPS NOTED ON SMEAR,  UNABLE TO ESTIMATE    Basic Metabolic Panel: Recent Labs  Lab 08/13/24 0349 08/14/24 0234 08/15/24 0315 08/16/24 0206 08/17/24 1014 08/17/24 1220 08/17/24 1249 08/18/24 0243 08/18/24 1350 08/18/24 2031 08/19/24 0235  NA 140 141   < > 145 149* 150* 149* 148* 147* 145 144  K 4.5 4.9   < > 5.0 6.2* 5.2* 5.3* 4.7  --   --  4.6  CL 110 113*   < > 115* 116*  --  116* 114*  --   --  112*  CO2 18* 18*   < > 19* 21*  --  24 20*  --   --  18*  GLUCOSE 172* 198*   < > 217* 287*  --  245* 136*  --   --  189*  BUN 56* 69*   < > 107* 141*  --  143* 145*  --   --  141*  CREATININE  3.38* 3.47*   < > 3.73* 4.59*  --  4.57* 4.82*  --   --  4.82*  CALCIUM  9.0 9.1   < > 9.3 9.0  --  8.6* 8.7*  --   --  8.1*  MG 2.4 2.6*  --  2.9*  --   --   --  3.3*  --   --  2.9*  PHOS 3.7 3.3  --   --   --   --   --  4.1  --   --  4.4   < > = values in this interval not displayed.   GFR: Estimated Creatinine Clearance: 13.9 mL/min (A) (by C-G formula based on SCr of 4.82 mg/dL (H)). Recent Labs  Lab 08/17/24 1014 08/17/24 1249 08/18/24 0243 08/19/24 0235  WBC 5.0 3.1* 3.2* 2.5*  LATICACIDVEN  --  1.2  --   --     Liver Function Tests: Recent Labs  Lab 08/16/24 0206 08/17/24 1014  AST 22 37  ALT 25 40  ALKPHOS 71 82  BILITOT 0.9 0.8  PROT 7.1 7.2  ALBUMIN  3.1* 3.0*   No results for input(s): LIPASE, AMYLASE in the last 168 hours. Recent Labs  Lab 08/18/24 0855  AMMONIA 30   ABG    Component Value Date/Time   PHART 7.382 08/17/2024 1220   PCO2ART 38.9 08/17/2024 1220   PO2ART 351 (H) 08/17/2024 1220   HCO3 23.1 08/17/2024 1220   TCO2 24 08/17/2024 1220   ACIDBASEDEF 2.0 08/17/2024 1220   O2SAT 100 08/17/2024 1220    Coagulation Profile: Recent Labs  Lab 08/17/24 1249  INR 1.2   Cardiac Enzymes: No results for input(s): CKTOTAL, CKMB, CKMBINDEX, TROPONINI in the last 168 hours.  HbA1C: Hemoglobin A1C  Date/Time Value Ref Range Status  05/13/2024 08:43 AM 6.1 (A) 4.0 - 5.6 % Final  12/09/2019 12:00 AM 6.7  Final   Hgb A1c MFr Bld  Date/Time Value Ref Range Status  08/12/2024 06:12 AM 6.0 (H) 4.8 - 5.6 % Final    Comment:    (NOTE) Diagnosis of Diabetes The following HbA1c ranges recommended by the American Diabetes Association (ADA) may be used as an aid in the diagnosis of diabetes mellitus.  Hemoglobin             Suggested A1C NGSP%              Diagnosis  <5.7                   Non Diabetic  5.7-6.4  Pre-Diabetic  >6.4                   Diabetic  <7.0                   Glycemic control for                        adults with diabetes.    01/15/2024 08:40 AM 6.1 (H) <5.7 % Final    Comment:    For someone without known diabetes, a hemoglobin  A1c value between 5.7% and 6.4% is consistent with prediabetes and should be confirmed with a  follow-up test. . For someone with known diabetes, a value <7% indicates that their diabetes is well controlled. A1c targets should be individualized based on duration of diabetes, age, comorbid conditions, and other considerations. . This assay result is consistent with an increased risk of diabetes. . Currently, no consensus exists regarding use of hemoglobin A1c for diagnosis of diabetes for children. .    CBG: Recent Labs  Lab 08/18/24 1539 08/18/24 1924 08/18/24 2338 08/19/24 0349 08/19/24 0753  GLUCAP 149* 155* 157* 171* 174*   Review of Systems:   See HPI   Past Medical History:  He,  has a past medical history of Anemia, Anxiety, Aortic insufficiency, Arthritis, Bilateral lower extremity edema, Chronic gout, CKD (chronic kidney disease), stage IV (HCC), Coronary artery disease (cardiologist--- dr lavona), Degenerative arthritis of shoulder region (08/2013), History of bladder cancer (07/2008), History of cellulitis (11/2017), History of kidney stones, Hyperlipidemia, Hypertension, IDDM (insulin  dependent diabetes mellitus), Insulin  pump in place, Mitral regurgitation, OSA (obstructive sleep apnea), Prostate cancer [facility]) (urologist--- dr herrick/  oncologist--- manning), RA (rheumatoid arthritis) (HCC), S/P CABG x 3 (07/15/2018), and Thrombocytopenia.   Surgical History:   Past Surgical History:  Procedure Laterality Date   CHOLECYSTECTOMY  08/29/2012   Procedure: LAPAROSCOPIC CHOLECYSTECTOMY WITH INTRAOPERATIVE CHOLANGIOGRAM;  Surgeon: Donnice POUR. Tsuei, MD;  Location: [facility];  Service: General;  Laterality: N/A;   CORONARY ARTERY BYPASS GRAFT N/A 07/20/2018   Procedure: CORONARY ARTERY BYPASS GRAFTING (CABG) times three using left  internal mammary artery to the LAD, and using endoscopically harvested right saphenous vein to PDA and intermedius.;  Surgeon: Army Dallas NOVAK, MD;  Location: [facility];  Service: Open Heart Surgery;  Laterality: N/A;   LEFT HEART CATH AND CORONARY ANGIOGRAPHY N/A 07/15/2018   Procedure: LEFT HEART CATH AND CORONARY ANGIOGRAPHY;  Surgeon: Anner Alm ORN, MD;  Location: [facility] INVASIVE CV LAB;  Service: Cardiovascular;  Laterality: N/A;   RADIOACTIVE SEED IMPLANT N/A 06/21/2020   Procedure: RADIOACTIVE SEED IMPLANT/BRACHYTHERAPY IMPLANT;  Surgeon: Cam Morene ORN, MD;  Location: [facility];  Service: Urology;  Laterality: N/A;   SHOULDER ARTHROSCOPY W/ ROTATOR CUFF REPAIR Right 09/03/2013   SHOULDER ARTHROSCOPY WITH SUBACROMIAL DECOMPRESSION, ROTATOR CUFF REPAIR AND BICEP TENDON REPAIR Left 09/03/2013   Procedure: LEFT SHOULDER ARTHROSCOPY WITH EXTENSIVE DEBRIDMENT, DISTAL CLAVICULECTOMY, ROTATOR CUFF REPAIR AND SUBACROMIAL DECOMPRESSION PARTIAL ACRIOMIOPLASTY WITH CORACOACROMIAL RELEASE;  Surgeon: Evalene JONETTA Chancy, MD;  Location: [facility];  Service: Orthopedics;  Laterality: Left;   SPACE OAR INSTILLATION N/A 06/21/2020   Procedure: SPACE OAR INSTILLATION;  Surgeon: Cam Morene ORN, MD;  Location: [facility];  Service: Urology;  Laterality: N/A;   TEE WITHOUT CARDIOVERSION N/A 07/20/2018   Procedure: TRANSESOPHAGEAL ECHOCARDIOGRAM (TEE);  Surgeon: Army Dallas NOVAK, MD;  Location: [facility] OR;  Service: Open Heart Surgery;  Laterality: N/A;  TRANSURETHRAL RESECTION OF BLADDER TUMOR WITH MITOMYCIN -C  08/24/2008   @[facility]      Social History:   reports that he quit smoking about 26 years ago. His smoking use included cigarettes. He started smoking about 51 years ago. He has a 50 pack-year smoking history. He has never used smokeless tobacco. He reports that he does not currently use alcohol. He reports that he does not use drugs.   Family History:  His  family history includes CAD in his maternal grandfather and paternal grandfather; Cancer in his father; Coronary artery disease in his father; Diabetes in his mother; Lung cancer in his father and mother; Sudden death in his son. There is no history of Breast cancer, Colon cancer, or Pancreatic cancer.   Allergies No Active Allergies   Home Medications  Prior to Admission medications   Medication Sig Start Date End Date Taking? Authorizing Provider  albuterol  (VENTOLIN  HFA) 108 (90 Base) MCG/ACT inhaler Inhale 2 puffs into the lungs every 6 (six) hours as needed for wheezing or shortness of breath. 06/28/24  Yes Charley Conger, PA-C  allopurinol  (ZYLOPRIM ) 100 MG tablet Take 0.5 tablets (50 mg total) by mouth daily. 03/19/23 08/10/24 Yes Kathrin Mignon DASEN, MD  aspirin  EC 81 MG tablet Take 81 mg by mouth daily.   Yes [provider]  atorvastatin  (LIPITOR) 40 MG tablet TAKE 1 TABLET BY MOUTH EVERY DAY 04/23/24  Yes Lavona Agent, MD  Boswellia-Glucosamine-Vit D (OSTEO BI-FLEX ONE PER DAY PO) Take 1 tablet by mouth daily.   Yes [provider]  Cholecalciferol  (VITAMIN D3) 50 MCG (2000 UT) TABS Take 1 tablet by mouth daily.   Yes [provider]  CINNAMON PO Take 500 mg by mouth daily.   Yes [provider]  furosemide  (LASIX ) 20 MG tablet Take 1 tablet (20 mg total) by mouth daily. 04/12/24  Yes Lavona Agent, MD  gabapentin  (NEURONTIN ) 100 MG capsule Take 1 capsule (100 mg total) by mouth 3 (three) times daily as needed (pain). Patient taking differently: Take 100 mg by mouth at bedtime. 05/16/23  Yes Long, Fonda MATSU, MD  hydrALAZINE  (APRESOLINE ) 100 MG tablet Take 1 tablet (100 mg total) by mouth 3 (three) times daily. Patient taking differently: Take 50-100 mg by mouth See admin instructions. 1 tab in the morning, 1 tab at noon, and 2 at bedtime 03/29/22  Yes Ricky Fines, MD  insulin  aspart (NOVOLOG ) 100 UNIT/ML injection USE A MAX OF 100 UNITS DAILY VIA INSULIN   PUMP 05/13/24  Yes Thapa, Sudan, MD  isosorbide  mononitrate (IMDUR ) 120 MG 24 hr tablet TAKE 1 TABLET BY MOUTH EVERY DAY 01/07/24  Yes Lavona Agent, MD  latanoprost  (XALATAN ) 0.005 % ophthalmic solution Place 1 drop into both eyes at bedtime. 09/18/21  Yes [provider]  Multiple Vitamins-Minerals (CENTRUM SILVER PO) Take 1 tablet by mouth daily.   Yes [provider]  nitroGLYCERIN  (NITROSTAT ) 0.4 MG SL tablet Place 1 tablet (0.4 mg total) under the tongue every 5 (five) minutes as needed for chest pain. 10/26/21 08/10/24 Yes Lavona Agent, MD  olmesartan  (BENICAR ) 5 MG tablet Take 1 tablet (5 mg total) by mouth daily. 03/24/23  Yes Gonfa, Taye T, MD  sertraline  (ZOLOFT ) 100 MG tablet TAKE 1 TABLET BY MOUTH EVERY DAY Patient taking differently: Take 50 mg by mouth daily. 04/09/24  Yes Lavona Agent, MD  tamsulosin  (FLOMAX ) 0.4 MG CAPS capsule Take 0.4 mg by mouth at bedtime.   Yes [provider]  Insulin  Human (INSULIN  PUMP)  SOLN Inject into the skin.    [provider]  prednisoLONE acetate (PRED FORTE) 1 % ophthalmic suspension SMARTSIG:In Eye(s) Patient not taking: Reported on 08/10/2024 08/06/24   [provider]     Critical care time: 30 minutes    Seri Kimmer, DNP, AGACNP-BC [facility]  Please see [email or web address] for pager details.  From 7A-7P if no response, please call [phone]. After hours, please call ELink [phone].

## 2024-08-19 NOTE — Progress Notes (Signed)
 eLink Physician-Brief Progress Note Patient Name: Alec [name] DOB: Mar 17, 1946 MRN: [id number]   Date of Service  08/19/2024  HPI/Events of Note  Na+ 145  eICU Interventions  1/2 NS gtt discontinued.        Keilly Fatula U Catelynn Sparger 08/19/2024, 12:46 AM

## 2024-08-19 NOTE — Progress Notes (Signed)
 Nephrology Follow-Up Consult note   Assessment/Recommendations: Alec [name] is a/an 78 y.o. male with a past medical history significant for CAD, HTN, DM2, COPD, CKD 4 who presents with ICH complicated by AKI on CKD, hypernatremia, altered mental status   Severe AKI on CKD 4: Baseline creatinine around 4.  Likely some degree of uremia.  AKI likely ATN.  He is a poor long-term dialysis candidate.  May require his wife attend dialysis with him.  She is still likely interested in dialysis.  Trying to avoid if at all possible.  If he does not improve tomorrow we may need to start dialysis -Increase enteral free water as able -Continue to monitor daily Cr, Dose meds for GFR -Monitor Daily I/Os, Daily weight  -Maintain MAP>65 for optimal renal perfusion.  -Avoid nephrotoxic medications including NSAIDs -Use synthetic opioids (Fentanyl /Dilaudid ) if needed   Severe altered mental status: Likely multifactorial with stroke contributing.  Uremia also contributing to some degree.  Treatment of AKI as above.  Other management per primary team   Hypernatremia: Associated with intracranial pathology and decreased access to free water.  Continue IV fluids as above.  Increase enteral free water as able   Shock: Likely vasoplegic.  Now off norepinephrine .  Utilize if needed   Hyperkalemia: Associated with AKI.  Now resolved   Acute hypoxic respiratory failure: Likely related to altered mental status and inability to protect airway.  Now intubated.  Management per CCM   Uncontrolled type 2 diabetes with hyperglycemia: Management per primary team.  Titrate insulin  as needed.   Recommendations conveyed to primary service.    [facility] 08/19/2024 11:46 AM  ___________________________________________________________  CC: Right sided weakness  Interval History/Subjective: Patient more awake and alert today.  Urine output the same at 1.4 L.  Creatinine essentially  unchanged at 4.8.     Medications:  Current Facility-Administered Medications  Medication Dose Route Frequency Provider Last Rate Last Admin   acetaminophen  (TYLENOL ) tablet 650 mg  650 mg Oral Q4H PRN Michaela Aisha SQUIBB, MD       Or   acetaminophen  (TYLENOL ) 160 MG/5ML solution 650 mg  650 mg Per Tube Q4H PRN Michaela Aisha SQUIBB, MD   650 mg at 08/19/24 9049   Or   acetaminophen  (TYLENOL ) suppository 650 mg  650 mg Rectal Q4H PRN Michaela Aisha SQUIBB, MD       allopurinol  (ZYLOPRIM ) tablet 50 mg  50 mg Per Tube Daily Jerri Pfeiffer, MD   50 mg at 08/19/24 0951   arformoterol (BROVANA) nebulizer solution 15 mcg  15 mcg Nebulization BID Smith, Joshua C, NP   15 mcg at 08/19/24 0820   atorvastatin  (LIPITOR) tablet 40 mg  40 mg Per Tube Daily Jerri Pfeiffer, MD   40 mg at 08/19/24 0951   budesonide (PULMICORT) nebulizer solution 0.25 mg  0.25 mg Nebulization BID Smith, Joshua C, NP   0.25 mg at 08/19/24 0820   Chlorhexidine  Gluconate Cloth 2 % PADS 6 each  6 each Topical Q0600 Michaela Aisha SQUIBB, MD   6 each at 08/18/24 2245   dexmedetomidine  (PRECEDEX ) 400 MCG/100ML (4 mcg/mL) infusion  0-0.7 mcg/kg/hr Intravenous Titrated Smith, Joshua C, NP   Stopped at 08/18/24 0857   docusate (COLACE) 50 MG/5ML liquid 100 mg  100 mg Per Tube BID Smith, Joshua C, NP   100 mg at 08/18/24 0916   feeding supplement (GLUCERNA 1.5 CAL) liquid 1,000 mL  1,000 mL Per Tube Continuous Xu, Jindong, MD 55 mL/hr  at 08/19/24 0952 1,000 mL at 08/19/24 0952   feeding supplement (PROSource TF20) liquid 60 mL  60 mL Per Tube Daily Jerri Pfeiffer, MD   60 mL at 08/19/24 9047   fentaNYL  (SUBLIMAZE ) injection 50-200 mcg  50-200 mcg Intravenous Q30 min PRN Smith, Joshua C, NP   50 mcg at 08/19/24 0456   free water 250 mL  250 mL Per Tube Q4H Dagenhart, Jamie H, NP   250 mL at 08/19/24 0800   heparin  injection 5,000 Units  5,000 Units Subcutaneous Q8H Xu, Jindong, MD   5,000 Units at 08/19/24 0604   hydrALAZINE  (APRESOLINE )  injection 20 mg  20 mg Intravenous Q4H PRN Remi Pippin, NP   20 mg at 08/15/24 9192   insulin  aspart (novoLOG ) injection 0-20 Units  0-20 Units Subcutaneous Q4H Smith, Joshua C, NP   4 Units at 08/19/24 9149   insulin  glargine-yfgn (SEMGLEE ) injection 35 Units  35 Units Subcutaneous Daily Cindy Garnette POUR, MD   35 Units at 08/19/24 0951   ipratropium-albuterol  (DUONEB) 0.5-2.5 (3) MG/3ML nebulizer solution 3 mL  3 mL Nebulization Q4H PRN Cindy Garnette POUR, MD   3 mL at 08/17/24 0955   labetalol (NORMODYNE) injection 40 mg  40 mg Intravenous Q4H PRN Remi Pippin, NP   40 mg at 08/17/24 0407   latanoprost  (XALATAN ) 0.005 % ophthalmic solution 1 drop  1 drop Both Eyes QHS Jerri Pfeiffer, MD   1 drop at 08/18/24 2218   norepinephrine  (LEVOPHED ) 4mg  in (0.016 mg/mL) premix infusion  0-10 mcg/min Intravenous Titrated Claudene Fonda BROCKS, NP   Stopped at 08/18/24 0521   Oral care mouth rinse  15 mL Mouth Rinse Q2H Harold Scholz, MD   15 mL at 08/19/24 9047   Oral care mouth rinse  15 mL Mouth Rinse PRN Harold Scholz, MD       pantoprazole  (PROTONIX ) injection 40 mg  40 mg Intravenous QHS Michaela Aisha SQUIBB, MD   40 mg at 08/18/24 2218   piperacillin -tazobactam (ZOSYN ) IVPB 2.25 g  2.25 g Intravenous Q6H Fobbs, Rodericks T, RPH   Stopped at 08/19/24 9362   polyethylene glycol (MIRALAX  / GLYCOLAX ) packet 17 g  17 g Per Tube Daily Claudene Fonda BROCKS, NP       QUEtiapine (SEROQUEL) tablet 25 mg  25 mg Per Tube QHS Jerri Pfeiffer, MD   25 mg at 08/18/24 2217   senna-docusate (Senokot-S) tablet 1 tablet  1 tablet Per Tube BID Fobbs, Rodericks T, RPH   1 tablet at 08/18/24 0916   sertraline  (ZOLOFT ) tablet 50 mg  50 mg Per Tube Daily Marten Larraine HERO, RPH   50 mg at 08/19/24 9048      Review of Systems: Unable to obtain due to the patient's sedation  Physical Exam: Vitals:   08/19/24 0900 08/19/24 1000  BP: (!) 143/63 (!) 150/62  Pulse: 72 76  Resp: 18 (!) 26  Temp:    SpO2: 99% 96%   Total I/O In: 410  [NG/GT:360; IV Piggyback:50] Out: 300 [Urine:300]  Intake/Output Summary (Last 24 hours) at 08/19/2024 1146 Last data filed at 08/19/2024 1000 Gross per 24 hour  Intake 3142.27 ml  Output 1830 ml  Net 1312.27 ml   Constitutional: Lying in bed, no distress ENMT: ears and nose without scars or lesions, MMM CV: normal rate, no edema Respiratory: Bilateral chest rise, ventilated Gastrointestinal: soft, non-tender, no palpable masses or hernias Skin: no visible lesions or rashes Psych: More awake and alert, moderately interactive  Test Results I personally reviewed new and old clinical labs and radiology tests Lab Results  Component Value Date   NA 144 08/19/2024   K 4.6 08/19/2024   CL 112 (H) 08/19/2024   CO2 18 (L) 08/19/2024   BUN 141 (H) 08/19/2024   CREATININE 4.82 (H) 08/19/2024   GFR 23.26 (L) 07/10/2023   CALCIUM  8.1 (L) 08/19/2024   ALBUMIN  3.0 (L) 08/17/2024   PHOS 4.4 08/19/2024    CBC Recent Labs  Lab 08/17/24 1249 08/18/24 0243 08/19/24 0235  WBC 3.1* 3.2* 2.5*  NEUTROABS  --  1.8  --   HGB 10.5* 11.3* 10.1*  HCT 32.6* 34.7* 30.9*  MCV 96.2 94.8 96.0  PLT 114* 113* PLATELET CLUMPS NOTED ON SMEAR, UNABLE TO ESTIMATE

## 2024-08-19 NOTE — Progress Notes (Signed)
 SLP Cancellation Note  Patient Details Name: Alec [name] MRN: [id number] DOB: 03-09-1946   Cancelled treatment:   Reason Eval/Treat Not Completed: Medical issues which prohibited therapy. Pt transferred to ICU, now intubated. Will f/u as able.          Vona Palma Laurice 08/19/2024, 7:54 AM

## 2024-08-19 NOTE — Progress Notes (Signed)
 PT Cancellation Note  Patient Details Name: DALE RIBEIRO MRN: [id number] DOB: 1946/07/05   Cancelled Treatment:    Reason Eval/Treat Not Completed: Patient not medically ready  Patient now in ICU on ventilator and sedated. Will continue efforts.    Macario RAMAN, PT Acute Rehabilitation Services  Office [phone]  Macario SHAUNNA Soja 08/19/2024, 7:56 AM

## 2024-08-20 DIAGNOSIS — J449 Chronic obstructive pulmonary disease, unspecified: Secondary | ICD-10-CM | POA: Diagnosis not present

## 2024-08-20 DIAGNOSIS — I619 Nontraumatic intracerebral hemorrhage, unspecified: Secondary | ICD-10-CM | POA: Diagnosis not present

## 2024-08-20 DIAGNOSIS — J9601 Acute respiratory failure with hypoxia: Secondary | ICD-10-CM | POA: Diagnosis not present

## 2024-08-20 DIAGNOSIS — G9341 Metabolic encephalopathy: Secondary | ICD-10-CM | POA: Diagnosis not present

## 2024-08-20 LAB — BASIC METABOLIC PANEL WITH GFR
Anion gap: 10 (ref 5–15)
BUN: 117 mg/dL — ABNORMAL HIGH (ref 8–23)
CO2: 21 mmol/L — ABNORMAL LOW (ref 22–32)
Calcium: 8.4 mg/dL — ABNORMAL LOW (ref 8.9–10.3)
Chloride: 113 mmol/L — ABNORMAL HIGH (ref 98–111)
Creatinine, Ser: 4.55 mg/dL — ABNORMAL HIGH (ref 0.61–1.24)
GFR, Estimated: 13 mL/min — ABNORMAL LOW (ref >60)
Glucose, Bld: 175 mg/dL — ABNORMAL HIGH (ref 70–99)
Potassium: 4.9 mmol/L (ref 3.5–5.1)
Sodium: 144 mmol/L (ref 135–145)

## 2024-08-20 LAB — GLUCOSE, CAPILLARY
Glucose-Capillary: 165 mg/dL — ABNORMAL HIGH (ref 70–99)
Glucose-Capillary: 142 mg/dL — ABNORMAL HIGH (ref 70–99)
Glucose-Capillary: 173 mg/dL — ABNORMAL HIGH (ref 70–99)
Glucose-Capillary: 167 mg/dL — ABNORMAL HIGH (ref 70–99)
Glucose-Capillary: 160 mg/dL — ABNORMAL HIGH (ref 70–99)
Glucose-Capillary: 178 mg/dL — ABNORMAL HIGH (ref 70–99)

## 2024-08-20 LAB — CBC
HCT: 29.3 % — ABNORMAL LOW (ref 39.0–52.0)
Hemoglobin: 9.6 g/dL — ABNORMAL LOW (ref 13.0–17.0)
MCH: 31.2 pg (ref 26.0–34.0)
MCHC: 32.8 g/dL (ref 30.0–36.0)
MCV: 95.1 fL (ref 80.0–100.0)
Platelets: 79 K/uL — ABNORMAL LOW (ref 150–400)
RBC: 3.08 MIL/uL — ABNORMAL LOW (ref 4.22–5.81)
RDW: 12.5 % (ref 11.5–15.5)
WBC: 2.1 K/uL — ABNORMAL LOW (ref 4.0–10.5)
nRBC: 0 % (ref 0.0–0.2)

## 2024-08-20 LAB — MAGNESIUM: Magnesium: 2.9 mg/dL — ABNORMAL HIGH (ref 1.7–2.4)

## 2024-08-20 LAB — PHOSPHORUS: Phosphorus: 5.1 mg/dL — ABNORMAL HIGH (ref 2.5–4.6)

## 2024-08-20 MED ORDER — ORAL CARE MOUTH RINSE: 15.0000 mL | OROMUCOSAL | Status: DC | PRN

## 2024-08-20 MED ORDER — ORAL CARE MOUTH RINSE
15.0000 mL | OROMUCOSAL | Status: DC
  Administered 2024-08-20 – 2024-09-04 (×57): 15 mL via OROMUCOSAL

## 2024-08-20 NOTE — Progress Notes (Signed)
 Physical Therapy Treatment Patient Details Name: Alec [name] MRN: [id number] DOB: 1946-01-10 Today's Date: 08/20/2024   History of Present Illness 78 y.o. male adm 08/10/2024 with Rt weakness and aphasia. Pt with large Lt basal ganglia hemorrhage complicated by AKI and aspiration. Intubated 11/18-11/21. PMHx: DM, HTN, CAD s/p CABG, CHF, RA, COPD, CKD, bladder & prostate CA, anxiety, gout, HLD    PT Comments  Pt post extubation with wife present and able to maintain SPO2 >93% on RA and 4L throughout session. Pt with improved attention, command following and balance this session with continued Rt hemplegia and decreased pushing in sitting. Pt with noted Rt foot wiggle to noxious stimuli. Pt remains appropriate for acute therapy to continue to work toward improved function, balance and safety. Patient will benefit from continued inpatient follow up therapy, <3 hours/day   BP supine 125/64 (66) Sitting 146/120 (129) HR 70   If plan is discharge home, recommend the following: Two people to help with walking and/or transfers;Two people to help with bathing/dressing/bathroom;Assistance with feeding;Direct supervision/assist for medications management;Direct supervision/assist for financial management;Assist for transportation;Help with stairs or ramp for entrance;Supervision due to cognitive status   Can travel by private vehicle     No  Equipment Recommendations  Wheelchair (measurements PT);Wheelchair cushion (measurements PT);Hospital bed;Hoyer lift;BSC/3in1    Recommendations for Other Services       Precautions / Restrictions Precautions Precautions: Fall;Other (comment) Recall of Precautions/Restrictions: Impaired Precaution/Restrictions Comments: SBP<160, cortrak, pushes to Rt, rectal pouch     Mobility  Bed Mobility Overal bed mobility: Needs Assistance Bed Mobility: Rolling, Sidelying to Sit, Sit to Supine Rolling: Min assist, Mod assist         General bed mobility  comments: rolling Rt with mod then min assist to complete over 2 trials, max cues for use of rail. Max assist to roll left. Max +2 to fully elevate trunk and clear legs off surface with pt assisted to hook RLE with LLE to move. Return to bed with physical assist for all aspects. Total A +2 to slide to [facility]    Transfers Overall transfer level: Needs assistance   Transfers: Sit to/from Stand Sit to Stand: Max assist, +2 physical assistance           General transfer comment: Rt knee blocked, use of pad and belt. Pt able to use LLE to push into standing with Max +2 to assist power up and control balance x 2 trials. blood noted on pad but with return to supine could not locate source, RN aware    Ambulation/Gait                   Stairs             Wheelchair Mobility     Tilt Bed    Modified Rankin (Stroke Patients Only)       Balance Overall balance assessment: Needs assistance Sitting-balance support: Feet supported, No upper extremity supported Sitting balance-Leahy Scale: Poor Sitting balance - Comments: posterior right bias, pt EOB 10 min and able to have brief moments of midline control at CGA with cues Postural control: Posterior lean, Right lateral lean Standing balance support: Bilateral upper extremity supported, During functional activity Standing balance-Leahy Scale: Zero                              Communication Communication Communication: Impaired Factors Affecting Communication: Difficulty expressing self;Reduced clarity of speech  Cognition  Arousal: Lethargic Behavior During Therapy: Flat affect   PT - Cognitive impairments: Difficult to assess                       PT - Cognition Comments: pt needing stimulation to arouse and cues to maintain eyes open, maintained attention but maintains upward gaze with appropriate response to cues and single step commands, able to state relationship to wife in room Following  commands: Impaired Following commands impaired: Follows one step commands inconsistently, Follows one step commands with increased time    Cueing Cueing Techniques: Verbal cues, Tactile cues, Gestural cues  Exercises      General Comments        Pertinent Vitals/Pain Pain Assessment Pain Assessment: CPOT Facial Expression: Relaxed, neutral Body Movements: Absence of movements Muscle Tension: Relaxed Compliance with ventilator (intubated pts.): N/A Vocalization (extubated pts.): Talking in normal tone or no sound CPOT Total: 0 Pain Intervention(s): Monitored during session, Repositioned    Home Living                          Prior Function            PT Goals (current goals can now be found in the care plan section) Acute Rehab PT Goals Time For Goal Achievement: 09/03/24 Potential to Achieve Goals: Fair Progress towards PT goals: Progressing toward goals    Frequency    Min 2X/week      PT Plan      Co-evaluation PT/OT/SLP Co-Evaluation/Treatment: Yes Reason for Co-Treatment: Complexity of the patient's impairments (multi-system involvement);For patient/therapist safety;To address functional/ADL transfers PT goals addressed during session: Strengthening/ROM;Balance;Mobility/safety with mobility        AM-PAC PT 6 Clicks Mobility   Outcome Measure  Help needed turning from your back to your side while in a flat bed without using bedrails?: A Lot Help needed moving from lying on your back to sitting on the side of a flat bed without using bedrails?: Total Help needed moving to and from a bed to a chair (including a wheelchair)?: Total Help needed standing up from a chair using your arms (e.g., wheelchair or bedside chair)?: Total Help needed to walk in hospital room?: Total Help needed climbing 3-5 steps with a railing? : Total 6 Click Score: 7    End of Session Equipment Utilized During Treatment: Gait belt Activity Tolerance: Patient  tolerated treatment well Patient left: in bed;with call bell/phone within reach;with family/visitor present;with bed alarm set Nurse Communication: Mobility status;Need for lift equipment PT Visit Diagnosis: Hemiplegia and hemiparesis;Unsteadiness on feet (R26.81);Other abnormalities of gait and mobility (R26.89);Muscle weakness (generalized) (M62.81);Difficulty in walking, not elsewhere classified (R26.2);Other symptoms and signs involving the nervous system (R29.898) Hemiplegia - Right/Left: Right Hemiplegia - dominant/non-dominant: Dominant Hemiplegia - caused by: Nontraumatic intracerebral hemorrhage     Time: 1302-1332 PT Time Calculation (min) (ACUTE ONLY): 30 min  Charges:    $Therapeutic Activity: 8-22 mins PT General Charges $$ ACUTE PT VISIT: 1 Visit                     Alec [name], PT Acute Rehabilitation Services Office: [phone]    Alec [name] 08/20/2024, 1:52 PM

## 2024-08-20 NOTE — Progress Notes (Signed)
 Patient extubated at 1120 per MD order with RN at bedside. Patient placed on 4L nasal cannula. Able to somewhat vocalize. Vitals stable. Positive cuff leak before extubation.

## 2024-08-20 NOTE — Progress Notes (Signed)
 Nephrology Follow-Up Consult note   Assessment/Recommendations: Alec [name] is a/an 78 y.o. male with a past medical history significant for CAD, HTN, DM2, COPD, CKD 4 who presents with ICH complicated by AKI on CKD, hypernatremia, altered mental status   Severe AKI on CKD 4: Baseline creatinine around 4.  Likely some degree of uremia.  AKI likely ATN.  He is a poor long-term dialysis candidate.  May require his wife attend dialysis with him outpatient if he makes it that far.  She is still likely interested in dialysis.  Trying to avoid if at all possible.  -Slight improvement in BUN and creatinine today -Continue holding off on dialysis but reevaluate daily -Continue to monitor daily Cr, Dose meds for GFR -Monitor Daily I/Os, Daily weight  -Maintain MAP>65 for optimal renal perfusion.  -Avoid nephrotoxic medications including NSAIDs -Use synthetic opioids (Fentanyl /Dilaudid ) if needed   Severe altered mental status: Likely multifactorial with stroke contributing.  Uremia also contributing to some degree.  Treatment of AKI as above.  Other management per primary team   Hypernatremia: Associated with intracranial pathology and decreased access to free water .  Has improved with free water    Shock: Likely vasoplegic.  Now off norepinephrine .  Utilize if needed   Hyperkalemia: Resolved with treatment of AKI   Acute hypoxic respiratory failure: Likely related to altered mental status and inability to protect airway.  Extubated midday by CCM.  Continue to monitor oxygen requirements closely   Uncontrolled type 2 diabetes with hyperglycemia: Management per primary team.  Titrate insulin  as needed.   Recommendations conveyed to primary service.    [facility] 08/20/2024 12:55 PM  ___________________________________________________________  CC: Right sided weakness  Interval History/Subjective: Patient stable this morning when I evaluated him.   Creatinine and BUN down slightly.  After I evaluated the patient he was extubated.     Medications:  Current Facility-Administered Medications  Medication Dose Route Frequency Provider Last Rate Last Admin   acetaminophen  (TYLENOL ) tablet 650 mg  650 mg Oral Q4H PRN Michaela Aisha SQUIBB, MD       Or   acetaminophen  (TYLENOL ) 160 MG/5ML solution 650 mg  650 mg Per Tube Q4H PRN Michaela Aisha SQUIBB, MD   650 mg at 08/19/24 9049   Or   acetaminophen  (TYLENOL ) suppository 650 mg  650 mg Rectal Q4H PRN Michaela Aisha SQUIBB, MD       allopurinol  (ZYLOPRIM ) tablet 50 mg  50 mg Per Tube Daily Jerri Pfeiffer, MD   50 mg at 08/20/24 1143   Ampicillin -Sulbactam (UNASYN ) 3 g in sodium chloride  0.9 % 100 mL IVPB  3 g Intravenous Q12H Chand, Valinda, MD 200 mL/hr at 08/20/24 1142 3 g at 08/20/24 1142   arformoterol  (BROVANA ) nebulizer solution 15 mcg  15 mcg Nebulization BID Smith, Joshua C, NP   15 mcg at 08/20/24 0758   atorvastatin  (LIPITOR) tablet 40 mg  40 mg Per Tube Daily Jerri Pfeiffer, MD   40 mg at 08/20/24 1145   budesonide  (PULMICORT ) nebulizer solution 0.25 mg  0.25 mg Nebulization BID Smith, Joshua C, NP   0.25 mg at 08/20/24 0759   Chlorhexidine  Gluconate Cloth 2 % PADS 6 each  6 each Topical Q0600 Michaela Aisha SQUIBB, MD   6 each at 08/20/24 0115   dexmedetomidine  (PRECEDEX ) 400 MCG/100ML (4 mcg/mL) infusion  0-0.7 mcg/kg/hr Intravenous Titrated Claudene Fonda BROCKS, NP 15.56 mL/hr at 08/20/24 0836 0.7 mcg/kg/hr at 08/20/24 0836   docusate (COLACE) 50 MG/5ML liquid  100 mg  100 mg Per Tube BID Smith, Joshua C, NP   100 mg at 08/18/24 0916   feeding supplement (GLUCERNA 1.5 CAL) liquid 1,000 mL  1,000 mL Per Tube Continuous Xu, Jindong, MD 55 mL/hr at 08/20/24 1100 Infusion Verify at 08/20/24 1100   feeding supplement (PROSource TF20) liquid 60 mL  60 mL Per Tube Daily Jerri Pfeiffer, MD   60 mL at 08/20/24 1143   fentaNYL  (SUBLIMAZE ) injection 50-200 mcg  50-200 mcg Intravenous Q30 min PRN Smith,  Joshua C, NP   100 mcg at 08/20/24 9175   free water  250 mL  250 mL Per Tube Q4H Dagenhart, Jamie H, NP   250 mL at 08/20/24 1146   heparin  injection 5,000 Units  5,000 Units Subcutaneous Q8H Xu, Jindong, MD   5,000 Units at 08/20/24 0617   hydrALAZINE  (APRESOLINE ) injection 20 mg  20 mg Intravenous Q4H PRN Remi Pippin, NP   20 mg at 08/15/24 9192   insulin  aspart (novoLOG ) injection 0-20 Units  0-20 Units Subcutaneous Q4H Smith, Joshua C, NP   4 Units at 08/20/24 1144   insulin  glargine-yfgn (SEMGLEE ) injection 35 Units  35 Units Subcutaneous Daily Cindy Garnette POUR, MD   35 Units at 08/20/24 1144   ipratropium-albuterol  (DUONEB) 0.5-2.5 (3) MG/3ML nebulizer solution 3 mL  3 mL Nebulization Q4H PRN Cindy Garnette POUR, MD   3 mL at 08/17/24 0955   labetalol  (NORMODYNE ) injection 40 mg  40 mg Intravenous Q4H PRN Remi Pippin, NP   40 mg at 08/17/24 0407   latanoprost  (XALATAN ) 0.005 % ophthalmic solution 1 drop  1 drop Both Eyes QHS Jerri Pfeiffer, MD   1 drop at 08/19/24 2153   Oral care mouth rinse  15 mL Mouth Rinse Q2H Harold Scholz, MD   15 mL at 08/20/24 1146   Oral care mouth rinse  15 mL Mouth Rinse PRN Harold Scholz, MD       pantoprazole  (PROTONIX ) injection 40 mg  40 mg Intravenous QHS Michaela Aisha SQUIBB, MD   40 mg at 08/19/24 2147   polyethylene glycol (MIRALAX  / GLYCOLAX ) packet 17 g  17 g Per Tube Daily Claudene Fonda BROCKS, NP       QUEtiapine  (SEROQUEL ) tablet 25 mg  25 mg Per Tube QHS Jerri Pfeiffer, MD   25 mg at 08/19/24 2151   senna-docusate (Senokot-S) tablet 1 tablet  1 tablet Per Tube BID Fobbs, Rodericks T, RPH   1 tablet at 08/18/24 0916   sertraline  (ZOLOFT ) tablet 50 mg  50 mg Per Tube Daily Marten Larraine HERO, RPH   50 mg at 08/20/24 1145      Review of Systems: Unable to obtain due to the patient's sedation  Physical Exam: Vitals:   08/20/24 1153 08/20/24 1200  BP:  130/60  Pulse: 68 68  Resp: 16 16  Temp:    SpO2: 100% 100%   Total I/O In: 840 [NG/GT:840] Out: 425  [Urine:425]  Intake/Output Summary (Last 24 hours) at 08/20/2024 1255 Last data filed at 08/20/2024 1146 Gross per 24 hour  Intake 3198.79 ml  Output 3100 ml  Net 98.79 ml   Constitutional: Lying in bed, no distress ENMT: ears and nose without scars or lesions, MMM CV: normal rate, no edema Respiratory: Bilateral chest rise, ventilated Gastrointestinal: soft, non-tender, no palpable masses or hernias Skin: no visible lesions or rashes Psych: More awake and alert, minimally interactive   Test Results I personally reviewed new and old clinical labs and radiology  tests Lab Results  Component Value Date   NA 144 08/20/2024   K 4.9 08/20/2024   CL 113 (H) 08/20/2024   CO2 21 (L) 08/20/2024   BUN 117 (H) 08/20/2024   CREATININE 4.55 (H) 08/20/2024   GFR 23.26 (L) 07/10/2023   CALCIUM  8.4 (L) 08/20/2024   ALBUMIN  3.0 (L) 08/17/2024   PHOS 5.1 (H) 08/20/2024    CBC Recent Labs  Lab 08/18/24 0243 08/19/24 0235 08/20/24 0331  WBC 3.2* 2.5* 2.1*  NEUTROABS 1.8  --   --   HGB 11.3* 10.1* 9.6*  HCT 34.7* 30.9* 29.3*  MCV 94.8 96.0 95.1  PLT 113* PLATELET CLUMPS NOTED ON SMEAR, UNABLE TO ESTIMATE 79*

## 2024-08-20 NOTE — Progress Notes (Signed)
 Occupational Therapy Treatment Patient Details Name: Alec [name] MRN: [id number] DOB: [birth year] Today's Date: 08/20/2024   History of present illness 78 y.o. male adm 08/10/2024 with Rt weakness and aphasia. Pt with large Lt basal ganglia hemorrhage complicated by AKI and aspiration. Intubated 11/18-11/21. PMHx: DM, HTN, CAD s/p CABG, CHF, RA, COPD, CKD, bladder & prostate CA, anxiety, gout, HLD   OT comments  Pt is making steady progress towards their acute OT goals. Pt seen with PT to safely progress functional mobility. Overall he continues to need max-total A +2 for all aspects of mobility due to R hemiplegia. However he did tolerate unsupported sitting for 10 minutes with CGA - mod A and cues for correcting LOB. Pt stood with max A +2 and did initiate with cues. He maintained eyes open for most of the session, needed stimulation and cues for visual tracking and attention and vocalized some yes/no responses. OT to continue to follow acutely to facilitate progress towards established goals. Pt will continue to benefit from skilled inpatient follow up therapy, <3 hours/day.       If plan is discharge home, recommend the following:  Two people to help with walking and/or transfers;A lot of help with walking and/or transfers;A lot of help with bathing/dressing/bathroom;Two people to help with bathing/dressing/bathroom;Assistance with cooking/housework;Direct supervision/assist for medications management;Direct supervision/assist for financial management;Assist for transportation;Help with stairs or ramp for entrance   Equipment Recommendations  None recommended by OT    Recommendations for Other Services Rehab consult    Precautions / Restrictions Precautions Precautions: Fall;Other (comment) Recall of Precautions/Restrictions: Impaired Precaution/Restrictions Comments: SBP<160, cortrak, pushes to Rt, rectal pouch Restrictions Weight Bearing Restrictions Per Provider Order: No        Mobility Bed Mobility Overal bed mobility: Needs Assistance Bed Mobility: Rolling, Sidelying to Sit, Sit to Sidelying Rolling: Min assist, Mod assist Sidelying to sit: Max assist     Sit to sidelying: Total assist, +2 for physical assistance, +2 for safety/equipment      Transfers Overall transfer level: Needs assistance Equipment used: 2 person hand held assist Transfers: Sit to/from Stand Sit to Stand: Max assist, +2 physical assistance, +2 safety/equipment           General transfer comment: x2     Balance Overall balance assessment: Needs assistance Sitting-balance support: Feet supported, No upper extremity supported Sitting balance-Leahy Scale: Poor     Standing balance support: Bilateral upper extremity supported, During functional activity Standing balance-Leahy Scale: Zero                             ADL either performed or assessed with clinical judgement   ADL Overall ADL's : Needs assistance/impaired Eating/Feeding: NPO                       Toilet Transfer: Total assistance;+2 for physical assistance;+2 for safety/equipment   Toileting- Clothing Manipulation and Hygiene: Total assistance;+2 for safety/equipment;+2 for physical assistance       Functional mobility during ADLs: Total assistance;+2 for physical assistance;+2 for safety/equipment General ADL Comments: pt demonstrated improved arousal, attention and active participation. continues to be limited by R flaccid UE, balance, attention    Extremity/Trunk Assessment Upper Extremity Assessment Upper Extremity Assessment: RUE deficits/detail RUE Deficits / Details: flaccid. withdrew to noxious stim RUE Sensation: decreased light touch;decreased proprioception RUE Coordination: decreased fine motor;decreased gross motor   Lower Extremity Assessment Lower Extremity Assessment: Defer to PT  evaluation        Vision   Vision Assessment?: Vision impaired- to be further  tested in functional context   Perception Perception Perception: Not tested   Praxis Praxis Praxis: Not tested   Communication Communication Communication: Impaired Factors Affecting Communication: Difficulty expressing self;Reduced clarity of speech   Cognition Arousal: Lethargic Behavior During Therapy: Flat affect Cognition: Difficult to assess Difficult to assess due to: Level of arousal           OT - Cognition Comments: Pt maintained eyes open for 80% of the session, he gave a few accurate yes/nos and identified his wife. Minimal command following noted during mobility tasks                 Following commands: Impaired Following commands impaired: Follows one step commands inconsistently, Follows one step commands with increased time      Cueing   Cueing Techniques: Verbal cues, Tactile cues, Gestural cues        General Comments VSS.    Pertinent Vitals/ Pain       Pain Assessment Pain Assessment: Faces Faces Pain Scale: No hurt Pain Location: generalized with mobility and touching feet Pain Descriptors / Indicators: Discomfort, Grimacing, Guarding Pain Intervention(s): Limited activity within patient's tolerance, Monitored during session  Frequency  Min 2X/week        Progress Toward Goals  OT Goals(current goals can now be found in the care plan section)  Progress towards OT goals: Progressing toward goals  Acute Rehab OT Goals Patient Stated Goal: unable OT Goal Formulation: Patient unable to participate in goal setting Time For Goal Achievement: 08/26/24 Potential to Achieve Goals: Fair ADL Goals Pt Will Perform Grooming: with min assist;sitting Pt Will Perform Upper Body Dressing: with min assist;sitting Pt Will Perform Lower Body Dressing: with max assist;sit to/from stand Pt Will Transfer to Toilet: with max assist;stand pivot transfer;bedside commode Additional ADL Goal #1: Pt will complete bed mobility with mod A as a precursor to  ADLs  Plan      Co-evaluation    PT/OT/SLP Co-Evaluation/Treatment: Yes Reason for Co-Treatment: Complexity of the patient's impairments (multi-system involvement);For patient/therapist safety;To address functional/ADL transfers PT goals addressed during session: Strengthening/ROM;Balance;Mobility/safety with mobility OT goals addressed during session: Strengthening/ROM      AM-PAC OT 6 Clicks Daily Activity     Outcome Measure   Help from another person eating meals?: Total Help from another person taking care of personal grooming?: A Lot Help from another person toileting, which includes using toliet, bedpan, or urinal?: Total Help from another person bathing (including washing, rinsing, drying)?: Total Help from another person to put on and taking off regular upper body clothing?: Total Help from another person to put on and taking off regular lower body clothing?: Total 6 Click Score: 7    End of Session Equipment Utilized During Treatment: Gait belt  OT Visit Diagnosis: Unsteadiness on feet (R26.81);Other abnormalities of gait and mobility (R26.89);Muscle weakness (generalized) (M62.81);Hemiplegia and hemiparesis Hemiplegia - Right/Left: Right Hemiplegia - dominant/non-dominant: Dominant Hemiplegia - caused by: Other Nontraumatic intracranial hemorrhage   Activity Tolerance Patient tolerated treatment well   Patient Left in bed;with call bell/phone within reach;with bed alarm set   Nurse Communication Mobility status        Time: 8695-8666 OT Time Calculation (min): 29 min  Charges: OT General Charges $OT Visit: 1 Visit OT Treatments $Therapeutic Activity: 8-22 mins  Lucie Kendall, OTR/L Acute Rehabilitation Services Office [phone] Secure Chat Communication Preferred  Lucie JONETTA Kendall 08/20/2024, 2:41 PM

## 2024-08-20 NOTE — Progress Notes (Signed)
 eLink Physician-Brief Progress Note Patient Name: Alec [name] DOB: 02/17/1946 MRN: [id number]   Date of Service  08/20/2024  HPI/Events of Note  Patient has had a Foley since 11/18, had some initial urinary retention.  Now having urethral meatus bleeding with pink urine.  Receiving subcutaneous heparin   eICU Interventions  DC Foley, retention protocol     Intervention Category Minor Interventions: Routine modifications to care plan (e.g. PRN medications for pain, fever)  Adebayo Ensminger 08/20/2024, 11:00 PM

## 2024-08-20 NOTE — Progress Notes (Addendum)
 NAME:  Alec [name], MRN:  [id number], DOB:  April 21, 1946, LOS: 10 ADMISSION DATE:  08/10/2024, CONSULTATION DATE: 11/18  REFERRING MD:  MD Cindy CHIEF COMPLAINT:  AMS   History of Present Illness:  Patient is a 78 year old male with significant past medical history of hypertension, hyperlipidemia, CAD, CHF, DM 2-insulin -dependent, COPD, CKD stage IV, and thrombocytopenia who was admitted on 11/11 and found to have a left-sided subcortical ICH suspected due to hypertension with TRH managing and neurology following.  Per report, patient having periods of agitation, intermittent command following, but having dysarthric speech. Overnight on 11/17 into 11/18, patient started having increase in respiratory rate which continued to increase progressively.  TRH and respiratory therapy consulted PCCM for emergent consult in the setting of respiratory distress and worsening mental status.  Upon arrival to bedside, patient obtunded, tachypneic respiratory rate 30s to 40s, and having significant expiratory wheezing.  Patient unable to protect airway.  Wife updated at bedside about emergent intubation and stat CT of head.  Pertinent  Medical History   Past Medical History:  Diagnosis Date   Anemia    Anxiety    Aortic insufficiency    Arthritis    Bilateral lower extremity edema    Chronic gout    06-19-2020  per pt last episode 6 months , great toe   CKD (chronic kidney disease), stage IV (HCC)    Coronary artery disease cardiologist--- dr hochrein   CABG 2019   Degenerative arthritis of shoulder region 08/2013   left   History of bladder cancer 07/2008   s/p  TURBT   History of cellulitis 11/2017   left upper arm   History of kidney stones    Hyperlipidemia    Hypertension    followed by pcp/ cardiology   IDDM (insulin  dependent diabetes mellitus)    endocrinologist--- dr von---  pt uses insulin  pump and dexcom  (06-19-2020 per pt fasting sugar-- 98--110)   Insulin  pump in place    Mitral  regurgitation    OSA (obstructive sleep apnea)    Prostate cancer [facility]) urologist--- dr herrick/  oncologist--- manning   dx 11/ 2017  Gleason 3+3 active survillance;  bx 03-14-2020 Gleason 3+4   RA (rheumatoid arthritis) (HCC)    rhemotologist--- dr donnis---     S/P CABG x 3 07/15/2018   LIMA to LAD;  SVG to PDA;  SVG to RI   Thrombocytopenia      Significant Hospital Events: Including procedures, antibiotic start and stop dates in addition to other pertinent events   11/11 left-sided subcortical ICH 11/18 PCCM emergent consult for respiratory distress/decreased mental status; intubated 11/19: Failed SBT d/t apnea and inadequate wakefulness; follows commands, frequently somnolent  Interim History / Subjective:  BUN 117 from 141 Nephro holding on crrt for now Remains intubated doing well on psv Following some commands in LLE  Objective    Blood pressure 133/62, pulse 65, temperature 99.5 F (37.5 C), temperature source Axillary, resp. rate 16, height 5' 6 (1.676 m), weight 93 kg, SpO2 100%.    Vent Mode: PRVC FiO2 (%):  [40 %] 40 % Set Rate:  [16 bmp] 16 bmp Vt Set:  [510 mL] 510 mL PEEP:  [5 cmH20] 5 cmH20 Plateau Pressure:  [12 cmH20-18 cmH20] 14 cmH20   Intake/Output Summary (Last 24 hours) at 08/20/2024 0939 Last data filed at 08/20/2024 0818 Gross per 24 hour  Intake 3082.43 ml  Output 2975 ml  Net 107.43 ml   [employer]  08/18/24 0702 08/19/24 0500 08/20/24 0500  Weight: 88.9 kg 93.2 kg 93 kg   General:  critically ill appearing on mech vent HEENT: MM pink/moist; ETT in place Neuro: wiggles toes on LLE; minimal on right; opens mouth to command; perrl CV: s1s2, RRR , no m/r/g PULM:  dim clear BS bilaterally; on mech vent psv GI: soft, bsx4 active  Extremities: warm/dry, no edema  Skin: no rashes or lesions   Resolved problem list  Hypotension-sedation related peri-intubation Hyperkalemia Assessment and Plan  Left ICH secondary to  hypertension-11/11 Acute metabolic/uremic encephalopathy AMS Agitation/delirium CT of head 11/11 3.0 x 2.8 x 2.6 cm (estimated volume 11 mL) acute intraparenchymal hemorrhage centered at the left basal ganglia.  CT head 11/14 No significant interval change in size and morphology of intraparenchymal hemorrhage centered at the left basal ganglia. Surrounding vasogenic edema with regional mass effect and 4 mm of left-to-right shift, not appreciably changed. MRI 11/13 Left basal ganglia intraparenchymal hemorrhage measuring approximately 4.4 x 3.5 x 3.8 cm with moderate surrounding vasogenic edema and fluid-fluid level from hematocrit effect, similar to the prior exam. Mild rightward shift of midline structures by approximately 4 mm P: -BUN continues to trend down; nephro following; holding on HD for now -limit sedating meds -Continue neuroprotective measures- normothermia, euglycemia, HOB greater than 30, head in neutral alignment, normocapnia, normoxia -pt/ot/slp when appropriate  Acute Respiratory Failure  Inability to Protect Airway COPD hx  Possible aspiration Hx of OSA Emergent intubation 11/18 P:  -on sbt doing well; consider extubation -LTVV strategy with tidal volumes of 6-8 cc/kg ideal body weight -Wean PEEP/FiO2 for SpO2 >92% -VAP bundle in place -Daily SAT and SBT -PAD protocol in place -wean sedation for RASS goal 0 to -1 -Unasyn  for possible aspiration; follow cultures -triple therapy nebs  AKI on CKD Stage IV  Uremia Hypernatremia Worsening Creatinine, renal function  P: -nephro following; appreciate recs -free water ; trend na -Trend BMP / urinary output -Replace electrolytes as indicated -Avoid nephrotoxic agents, ensure adequate renal perfusion  HTN HLD  Hypotension-resolved off pressors P: Plan: -Norvasc , hydral, coreg  w/ soft bp -Hydralazine  PRN -Continue statin   DM2 Hyperglycemia  Not controlled  P: -Continue SSI and CBG q4 -Continue lantus   35 units daily   Pancytopenia P: -trend cbc  Peripheral neuropathy Plan: -home gabapentin  on hold due to ams; consider resuming at low dose to avoid withdrawal  Anxiety Plan: -cont zoloft    Labs   CBC: Recent Labs  Lab 08/17/24 1014 08/17/24 1220 08/17/24 1249 08/18/24 0243 08/19/24 0235 08/20/24 0331  WBC 5.0  --  3.1* 3.2* 2.5* 2.1*  NEUTROABS  --   --   --  1.8  --   --   HGB 11.5* 9.9* 10.5* 11.3* 10.1* 9.6*  HCT 35.6* 29.0* 32.6* 34.7* 30.9* 29.3*  MCV 96.0  --  96.2 94.8 96.0 95.1  PLT 125*  --  114* 113* PLATELET CLUMPS NOTED ON SMEAR, UNABLE TO ESTIMATE 79*    Basic Metabolic Panel: Recent Labs  Lab 08/14/24 0234 08/15/24 0315 08/16/24 0206 08/17/24 1014 08/17/24 1220 08/17/24 1249 08/18/24 0243 08/18/24 1350 08/18/24 2031 08/19/24 0235 08/20/24 0331  NA 141   < > 145 149* 150* 149* 148* 147* 145 144 144  K 4.9   < > 5.0 6.2* 5.2* 5.3* 4.7  --   --  4.6 4.9  CL 113*   < > 115* 116*  --  116* 114*  --   --  112* 113*  CO2  18*   < > 19* 21*  --  24 20*  --   --  18* 21*  GLUCOSE 198*   < > 217* 287*  --  245* 136*  --   --  189* 175*  BUN 69*   < > 107* 141*  --  143* 145*  --   --  141* 117*  CREATININE 3.47*   < > 3.73* 4.59*  --  4.57* 4.82*  --   --  4.82* 4.55*  CALCIUM  9.1   < > 9.3 9.0  --  8.6* 8.7*  --   --  8.1* 8.4*  MG 2.6*  --  2.9*  --   --   --  3.3*  --   --  2.9* 2.9*  PHOS 3.3  --   --   --   --   --  4.1  --   --  4.4 5.1*   < > = values in this interval not displayed.   GFR: Estimated Creatinine Clearance: 14.7 mL/min (A) (by C-G formula based on SCr of 4.55 mg/dL (H)). Recent Labs  Lab 08/17/24 1249 08/18/24 0243 08/19/24 0235 08/20/24 0331  WBC 3.1* 3.2* 2.5* 2.1*  LATICACIDVEN 1.2  --   --   --     Liver Function Tests: Recent Labs  Lab 08/16/24 0206 08/17/24 1014  AST 22 37  ALT 25 40  ALKPHOS 71 82  BILITOT 0.9 0.8  PROT 7.1 7.2  ALBUMIN  3.1* 3.0*   No results for input(s): LIPASE, AMYLASE in the  last 168 hours. Recent Labs  Lab 08/18/24 0855  AMMONIA 30   ABG    Component Value Date/Time   PHART 7.382 08/17/2024 1220   PCO2ART 38.9 08/17/2024 1220   PO2ART 351 (H) 08/17/2024 1220   HCO3 23.1 08/17/2024 1220   TCO2 24 08/17/2024 1220   ACIDBASEDEF 2.0 08/17/2024 1220   O2SAT 100 08/17/2024 1220    Coagulation Profile: Recent Labs  Lab 08/17/24 1249  INR 1.2   Cardiac Enzymes: No results for input(s): CKTOTAL, CKMB, CKMBINDEX, TROPONINI in the last 168 hours.  HbA1C: Hemoglobin A1C  Date/Time Value Ref Range Status  05/13/2024 08:43 AM 6.1 (A) 4.0 - 5.6 % Final  12/09/2019 12:00 AM 6.7  Final   Hgb A1c MFr Bld  Date/Time Value Ref Range Status  08/12/2024 06:12 AM 6.0 (H) 4.8 - 5.6 % Final    Comment:    (NOTE) Diagnosis of Diabetes The following HbA1c ranges recommended by the American Diabetes Association (ADA) may be used as an aid in the diagnosis of diabetes mellitus.  Hemoglobin             Suggested A1C NGSP%              Diagnosis  <5.7                   Non Diabetic  5.7-6.4                Pre-Diabetic  >6.4                   Diabetic  <7.0                   Glycemic control for                       adults with diabetes.    01/15/2024 08:40 AM 6.1 (H) <5.7 % Final  Comment:    For someone without known diabetes, a hemoglobin  A1c value between 5.7% and 6.4% is consistent with prediabetes and should be confirmed with a  follow-up test. . For someone with known diabetes, a value <7% indicates that their diabetes is well controlled. A1c targets should be individualized based on duration of diabetes, age, comorbid conditions, and other considerations. . This assay result is consistent with an increased risk of diabetes. . Currently, no consensus exists regarding use of hemoglobin A1c for diagnosis of diabetes for children. .    CBG: Recent Labs  Lab 08/19/24 1515 08/19/24 1922 08/19/24 2322 08/20/24 0337  08/20/24 0759  GLUCAP 137* 148* 144* 165* 142*   Review of Systems:   See HPI   Past Medical History:  He,  has a past medical history of Anemia, Anxiety, Aortic insufficiency, Arthritis, Bilateral lower extremity edema, Chronic gout, CKD (chronic kidney disease), stage IV (HCC), Coronary artery disease (cardiologist--- dr lavona), Degenerative arthritis of shoulder region (08/2013), History of bladder cancer (07/2008), History of cellulitis (11/2017), History of kidney stones, Hyperlipidemia, Hypertension, IDDM (insulin  dependent diabetes mellitus), Insulin  pump in place, Mitral regurgitation, OSA (obstructive sleep apnea), Prostate cancer [facility]) (urologist--- dr herrick/  oncologist--- manning), RA (rheumatoid arthritis) (HCC), S/P CABG x 3 (07/15/2018), and Thrombocytopenia.   Surgical History:   Past Surgical History:  Procedure Laterality Date   CHOLECYSTECTOMY  08/29/2012   Procedure: LAPAROSCOPIC CHOLECYSTECTOMY WITH INTRAOPERATIVE CHOLANGIOGRAM;  Surgeon: Donnice POUR. Tsuei, MD;  Location: [facility];  Service: General;  Laterality: N/A;   CORONARY ARTERY BYPASS GRAFT N/A 07/20/2018   Procedure: CORONARY ARTERY BYPASS GRAFTING (CABG) times three using left internal mammary artery to the LAD, and using endoscopically harvested right saphenous vein to PDA and intermedius.;  Surgeon: Army Dallas NOVAK, MD;  Location: [facility];  Service: Open Heart Surgery;  Laterality: N/A;   LEFT HEART CATH AND CORONARY ANGIOGRAPHY N/A 07/15/2018   Procedure: LEFT HEART CATH AND CORONARY ANGIOGRAPHY;  Surgeon: Anner Alm ORN, MD;  Location: [facility] LAB;  Service: Cardiovascular;  Laterality: N/A;   RADIOACTIVE SEED IMPLANT N/A 06/21/2020   Procedure: RADIOACTIVE SEED IMPLANT/BRACHYTHERAPY IMPLANT;  Surgeon: Cam Morene ORN, MD;  Location: [facility];  Service: Urology;  Laterality: N/A;   SHOULDER ARTHROSCOPY W/ ROTATOR CUFF REPAIR Right 09/03/2013   SHOULDER ARTHROSCOPY WITH  SUBACROMIAL DECOMPRESSION, ROTATOR CUFF REPAIR AND BICEP TENDON REPAIR Left 09/03/2013   Procedure: LEFT SHOULDER ARTHROSCOPY WITH EXTENSIVE DEBRIDMENT, DISTAL CLAVICULECTOMY, ROTATOR CUFF REPAIR AND SUBACROMIAL DECOMPRESSION PARTIAL ACRIOMIOPLASTY WITH CORACOACROMIAL RELEASE;  Surgeon: Evalene JONETTA Chancy, MD;  Location: [facility];  Service: Orthopedics;  Laterality: Left;   SPACE OAR INSTILLATION N/A 06/21/2020   Procedure: SPACE OAR INSTILLATION;  Surgeon: Cam Morene ORN, MD;  Location[facility];  Service: Urology;  Laterality: N/A;   TEE WITHOUT CARDIOVERSION N/A 07/20/2018   Procedure: TRANSESOPHAGEAL ECHOCARDIOGRAM (TEE);  Surgeon: Army Dallas NOVAK, MD;  Location: [facility] OR;  Service: Open Heart Surgery;  Laterality: N/A;   TRANSURETHRAL RESECTION OF BLADDER TUMOR WITH MITOMYCIN -C  08/24/2008   @[facility]      Social History:   reports that he quit smoking about 26 years ago. His smoking use included cigarettes. He started smoking about 51 years ago. He has a 50 pack-year smoking history. He has never used smokeless tobacco. He reports that he does not currently use alcohol. He reports that he does not use drugs.   Family History:  His family history includes CAD in  his maternal grandfather and paternal grandfather; Cancer in his father; Coronary artery disease in his father; Diabetes in his mother; Lung cancer in his father and mother; Sudden death in his son. There is no history of Breast cancer, Colon cancer, or Pancreatic cancer.   Allergies No Active Allergies   Home Medications  Prior to Admission medications   Medication Sig Start Date End Date Taking? Authorizing Provider  albuterol  (VENTOLIN  HFA) 108 (90 Base) MCG/ACT inhaler Inhale 2 puffs into the lungs every 6 (six) hours as needed for wheezing or shortness of breath. 06/28/24  Yes Charley Conger, PA-C  allopurinol  (ZYLOPRIM ) 100 MG tablet Take 0.5 tablets (50 mg total) by mouth daily. 03/19/23 08/10/24  Yes Gonfa, Taye T, MD  aspirin  EC 81 MG tablet Take 81 mg by mouth daily.   Yes [provider]  atorvastatin  (LIPITOR) 40 MG tablet TAKE 1 TABLET BY MOUTH EVERY DAY 04/23/24  Yes Hochrein, Lynwood, MD  Boswellia-Glucosamine-Vit D (OSTEO BI-FLEX ONE PER DAY PO) Take 1 tablet by mouth daily.   Yes [provider]  Cholecalciferol  (VITAMIN D3) 50 MCG (2000 UT) TABS Take 1 tablet by mouth daily.   Yes [provider]  CINNAMON PO Take 500 mg by mouth daily.   Yes [provider]  furosemide  (LASIX ) 20 MG tablet Take 1 tablet (20 mg total) by mouth daily. 04/12/24  Yes Lavona Lynwood, MD  gabapentin  (NEURONTIN ) 100 MG capsule Take 1 capsule (100 mg total) by mouth 3 (three) times daily as needed (pain). Patient taking differently: Take 100 mg by mouth at bedtime. 05/16/23  Yes Long, Fonda MATSU, MD  hydrALAZINE  (APRESOLINE ) 100 MG tablet Take 1 tablet (100 mg total) by mouth 3 (three) times daily. Patient taking differently: Take 50-100 mg by mouth See admin instructions. 1 tab in the morning, 1 tab at noon, and 2 at bedtime 03/29/22  Yes Ricky Fines, MD  insulin  aspart (NOVOLOG ) 100 UNIT/ML injection USE A MAX OF 100 UNITS DAILY VIA INSULIN  PUMP 05/13/24  Yes Thapa, Sudan, MD  isosorbide  mononitrate (IMDUR ) 120 MG 24 hr tablet TAKE 1 TABLET BY MOUTH EVERY DAY 01/07/24  Yes Lavona Lynwood, MD  latanoprost  (XALATAN ) 0.005 % ophthalmic solution Place 1 drop into both eyes at bedtime. 09/18/21  Yes [provider]  Multiple Vitamins-Minerals (CENTRUM SILVER PO) Take 1 tablet by mouth daily.   Yes [provider]  nitroGLYCERIN  (NITROSTAT ) 0.4 MG SL tablet Place 1 tablet (0.4 mg total) under the tongue every 5 (five) minutes as needed for chest pain. 10/26/21 08/10/24 Yes Lavona Lynwood, MD  olmesartan  (BENICAR ) 5 MG tablet Take 1 tablet (5 mg total) by mouth daily. 03/24/23  Yes Gonfa, Taye T, MD  sertraline  (ZOLOFT ) 100 MG tablet TAKE 1 TABLET BY MOUTH EVERY  DAY Patient taking differently: Take 50 mg by mouth daily. 04/09/24  Yes Lavona Lynwood, MD  tamsulosin  (FLOMAX ) 0.4 MG CAPS capsule Take 0.4 mg by mouth at bedtime.   Yes [provider]  Insulin  Human (INSULIN  PUMP) SOLN Inject into the skin.    [provider]  prednisoLONE acetate (PRED FORTE) 1 % ophthalmic suspension SMARTSIG:In Eye(s) Patient not taking: Reported on 08/10/2024 08/06/24   [provider]     Critical care time: 35 minutes     JD Emilio DEVONNA [facility] 08/20/2024, 10:25 AM  Please see [email or web address] for pager details.  From 7A-7P if no response, please call [phone]. After hours, please call ELink [phone].

## 2024-08-21 DIAGNOSIS — N179 Acute kidney failure, unspecified: Secondary | ICD-10-CM | POA: Diagnosis not present

## 2024-08-21 DIAGNOSIS — I619 Nontraumatic intracerebral hemorrhage, unspecified: Secondary | ICD-10-CM | POA: Diagnosis not present

## 2024-08-21 DIAGNOSIS — G9341 Metabolic encephalopathy: Secondary | ICD-10-CM | POA: Diagnosis not present

## 2024-08-21 DIAGNOSIS — N184 Chronic kidney disease, stage 4 (severe): Secondary | ICD-10-CM | POA: Diagnosis not present

## 2024-08-21 LAB — BASIC METABOLIC PANEL WITH GFR
Anion gap: 11 (ref 5–15)
BUN: 98 mg/dL — ABNORMAL HIGH (ref 8–23)
CO2: 20 mmol/L — ABNORMAL LOW (ref 22–32)
Calcium: 8.5 mg/dL — ABNORMAL LOW (ref 8.9–10.3)
Chloride: 116 mmol/L — ABNORMAL HIGH (ref 98–111)
Creatinine, Ser: 4.02 mg/dL — ABNORMAL HIGH (ref 0.61–1.24)
GFR, Estimated: 15 mL/min — ABNORMAL LOW (ref >60)
Glucose, Bld: 192 mg/dL — ABNORMAL HIGH (ref 70–99)
Potassium: 4.9 mmol/L (ref 3.5–5.1)
Sodium: 147 mmol/L — ABNORMAL HIGH (ref 135–145)

## 2024-08-21 LAB — GLUCOSE, CAPILLARY
Glucose-Capillary: 180 mg/dL — ABNORMAL HIGH (ref 70–99)
Glucose-Capillary: 196 mg/dL — ABNORMAL HIGH (ref 70–99)
Glucose-Capillary: 201 mg/dL — ABNORMAL HIGH (ref 70–99)
Glucose-Capillary: 190 mg/dL — ABNORMAL HIGH (ref 70–99)
Glucose-Capillary: 221 mg/dL — ABNORMAL HIGH (ref 70–99)

## 2024-08-21 LAB — CBC
HCT: 27.1 % — ABNORMAL LOW (ref 39.0–52.0)
Hemoglobin: 9 g/dL — ABNORMAL LOW (ref 13.0–17.0)
MCH: 31.7 pg (ref 26.0–34.0)
MCHC: 33.2 g/dL (ref 30.0–36.0)
MCV: 95.4 fL (ref 80.0–100.0)
Platelets: 83 K/uL — ABNORMAL LOW (ref 150–400)
RBC: 2.84 MIL/uL — ABNORMAL LOW (ref 4.22–5.81)
RDW: 12.5 % (ref 11.5–15.5)
WBC: 1.9 K/uL — ABNORMAL LOW (ref 4.0–10.5)
nRBC: 0 % (ref 0.0–0.2)

## 2024-08-21 LAB — MAGNESIUM: Magnesium: 2.8 mg/dL — ABNORMAL HIGH (ref 1.7–2.4)

## 2024-08-21 LAB — PHOSPHORUS: Phosphorus: 4.7 mg/dL — ABNORMAL HIGH (ref 2.5–4.6)

## 2024-08-21 MED ORDER — DEXTROSE 5 % IV SOLN: INTRAVENOUS | Status: AC

## 2024-08-21 MED ORDER — FREE WATER
300.0000 mL | Status: DC
  Administered 2024-08-21 – 2024-08-22 (×8): 300 mL

## 2024-08-21 MED ORDER — INSULIN GLARGINE-YFGN 100 UNIT/ML ~~LOC~~ SOLN
38.0000 [IU] | Freq: Every day | SUBCUTANEOUS | Status: DC
  Administered 2024-08-21 – 2024-08-23 (×3): 38 [IU] via SUBCUTANEOUS
  Filled 2024-08-21 (×4): qty 0.38

## 2024-08-21 NOTE — Progress Notes (Signed)
 Patients wife Lonell called and informed that the patient will be moving out of the ICU and given information about new room.

## 2024-08-21 NOTE — Progress Notes (Signed)
 NAME:  Alec [name], MRN:  [id number], DOB:  June 08, 1946, LOS: 11 ADMISSION DATE:  08/10/2024, CONSULTATION DATE: 11/18  REFERRING MD:  MD Cindy CHIEF COMPLAINT:  AMS   History of Present Illness:  Patient is a 78 year old male with significant past medical history of hypertension, hyperlipidemia, CAD, CHF, DM 2-insulin -dependent, COPD, CKD stage IV, and thrombocytopenia who was admitted on 11/11 and found to have a left-sided subcortical ICH suspected due to hypertension with TRH managing and neurology following.  Per report, patient having periods of agitation, intermittent command following, but having dysarthric speech. Overnight on 11/17 into 11/18, patient started having increase in respiratory rate which continued to increase progressively.  TRH and respiratory therapy consulted PCCM for emergent consult in the setting of respiratory distress and worsening mental status.  Upon arrival to bedside, patient obtunded, tachypneic respiratory rate 30s to 40s, and having significant expiratory wheezing.  Patient unable to protect airway.  Wife updated at bedside about emergent intubation and stat CT of head.  Pertinent  Medical History   Past Medical History:  Diagnosis Date   Anemia    Anxiety    Aortic insufficiency    Arthritis    Bilateral lower extremity edema    Chronic gout    06-19-2020  per pt last episode 6 months , great toe   CKD (chronic kidney disease), stage IV (HCC)    Coronary artery disease cardiologist--- dr hochrein   CABG 2019   Degenerative arthritis of shoulder region 08/2013   left   History of bladder cancer 07/2008   s/p  TURBT   History of cellulitis 11/2017   left upper arm   History of kidney stones    Hyperlipidemia    Hypertension    followed by pcp/ cardiology   IDDM (insulin  dependent diabetes mellitus)    endocrinologist--- dr von---  pt uses insulin  pump and dexcom  (06-19-2020 per pt fasting sugar-- 98--110)   Insulin  pump in place    Mitral  regurgitation    OSA (obstructive sleep apnea)    Prostate cancer [facility]) urologist--- dr herrick/  oncologist--- manning   dx 11/ 2017  Gleason 3+3 active survillance;  bx 03-14-2020 Gleason 3+4   RA (rheumatoid arthritis) (HCC)    rhemotologist--- dr donnis---     S/P CABG x 3 07/15/2018   LIMA to LAD;  SVG to PDA;  SVG to RI   Thrombocytopenia      Significant Hospital Events: Including procedures, antibiotic start and stop dates in addition to other pertinent events   11/11 left-sided subcortical ICH 11/18 PCCM emergent consult for respiratory distress/decreased mental status; intubated 11/19: Failed SBT d/t apnea and inadequate wakefulness; follows commands, frequently somnolent 11/21 BUN and creatinine started trending down, he is more awake, tolerated spontaneous breathing trial, extubated successfully on currently on room air  Interim History / Subjective:  Patient remained afebrile, mental status has improved BUN continue to trend down, currently at 98 down from 117, serum creatinine is trending down as well though is still around 4  Objective    Blood pressure (!) 145/62, pulse 68, temperature 97.8 F (36.6 C), temperature source Axillary, resp. rate 20, height 5' 6 (1.676 m), weight 92 kg, SpO2 96%.    FiO2 (%):  [99 %] 99 %   Intake/Output Summary (Last 24 hours) at 08/21/2024 0902 Last data filed at 08/21/2024 0800 Gross per 24 hour  Intake 3171.58 ml  Output 3250 ml  Net -78.42 ml   [employer]  08/19/24 0500 08/20/24 0500 08/21/24 0500  Weight: 93.2 kg 93 kg 92 kg   Physical exam: General: Acute on chronically ill-appearing elderly male, lying on the bed HEENT: [place]/AT, eyes anicteric.  Dry mucous membranes, cortrak in place Neuro: Awake, tracking examiner, following simple commands, dysarthric Chest: Coarse breath sounds, no wheezes or rhonchi Heart: Regular rate and rhythm, no murmurs or gallops Abdomen: Soft, nontender, nondistended, bowel sounds  present  Labs and images reviewed  Patient Lines/Drains/Airways Status     Active Line/Drains/Airways     Name Placement date Placement time Site Days   Peripheral IV 08/17/24 22 G 1 Anterior;Right Forearm 08/17/24  1240  Forearm  4   Peripheral IV 08/20/24 20 G Anterior;Right;Upper Arm 08/20/24  2324  Arm  1   Fecal Management System 45 mL 08/20/24  1620  -- 1   Small Bore Feeding Tube 10 Fr. Left nare Marking at nare/corner of mouth 68 cm 08/11/24  1421  Left nare  10   Wound 08/10/24 2300 Traumatic Elbow Posterior;Right 08/10/24  2300  Elbow  11   Wound 08/17/24 1200 Pressure Injury Sacrum Stage 1 -  Intact skin with non-blanchable redness of a localized area usually over a bony prominence. 08/17/24  1200  Sacrum  4   Wound 08/17/24 1200 Pressure Injury Heel Left Stage 1 -  Intact skin with non-blanchable redness of a localized area usually over a bony prominence. 08/17/24  1200  Heel  4         Resolved problem list  Hypotension-sedation related peri-intubation Hyperkalemia Assessment and Plan  AKI on CKD stage IV due to severe dehydration Acute metabolic/uremic encephalopathy, improving Acute left basal ganglia intraparenchymal hemorrhage in the setting of hypertensive emergency Acute respiratory failure with hypoxia, resolved Aspiration pneumonia Poorly controlled diabetes with hyperglycemia Hypernatremia/hyperphosphatemia Pancytopenia likely due to critical illness Obesity  Patient serum creatinine continue to improve, currently down to 4.0, down from 4.5 yesterday likely it was related to severe dehydration BUN also trended down, currently at 34 Nephrology is following Recommend continuing D5W to correct hypernatremia as well as improved BUN further Patient is a still dysarthric, part of it due to dry mucous membranes Mental status is improving Continue supportive care Blood pressure is controlled Continue IV antibiotics with Unasyn  to complete 7 days  therapy Respiratory culture is growing mixed flora Currently on room air, was successfully extubated yesterday Continue D5W and free water  flushes Monitor electrolytes Closely monitor CBC Diet and exercise counseling as appropriate    Labs   CBC: Recent Labs  Lab 08/17/24 1249 08/18/24 0243 08/19/24 0235 08/20/24 0331 08/21/24 0442  WBC 3.1* 3.2* 2.5* 2.1* 1.9*  NEUTROABS  --  1.8  --   --   --   HGB 10.5* 11.3* 10.1* 9.6* 9.0*  HCT 32.6* 34.7* 30.9* 29.3* 27.1*  MCV 96.2 94.8 96.0 95.1 95.4  PLT 114* 113* PLATELET CLUMPS NOTED ON SMEAR, UNABLE TO ESTIMATE 79* 83*    Basic Metabolic Panel: Recent Labs  Lab 08/16/24 0206 08/17/24 1014 08/17/24 1249 08/18/24 0243 08/18/24 1350 08/18/24 2031 08/19/24 0235 08/20/24 0331 08/21/24 0442  NA 145   < > 149* 148* 147* 145 144 144 147*  K 5.0   < > 5.3* 4.7  --   --  4.6 4.9 4.9  CL 115*   < > 116* 114*  --   --  112* 113* 116*  CO2 19*   < > 24 20*  --   --  18*  21* 20*  GLUCOSE 217*   < > 245* 136*  --   --  189* 175* 192*  BUN 107*   < > 143* 145*  --   --  141* 117* 98*  CREATININE 3.73*   < > 4.57* 4.82*  --   --  4.82* 4.55* 4.02*  CALCIUM  9.3   < > 8.6* 8.7*  --   --  8.1* 8.4* 8.5*  MG 2.9*  --   --  3.3*  --   --  2.9* 2.9* 2.8*  PHOS  --   --   --  4.1  --   --  4.4 5.1* 4.7*   < > = values in this interval not displayed.   GFR: Estimated Creatinine Clearance: 16.6 mL/min (A) (by C-G formula based on SCr of 4.02 mg/dL (H)). Recent Labs  Lab 08/17/24 1249 08/18/24 0243 08/19/24 0235 08/20/24 0331 08/21/24 0442  WBC 3.1* 3.2* 2.5* 2.1* 1.9*  LATICACIDVEN 1.2  --   --   --   --     Liver Function Tests: Recent Labs  Lab 08/16/24 0206 08/17/24 1014  AST 22 37  ALT 25 40  ALKPHOS 71 82  BILITOT 0.9 0.8  PROT 7.1 7.2  ALBUMIN  3.1* 3.0*   No results for input(s): LIPASE, AMYLASE in the last 168 hours. Recent Labs  Lab 08/18/24 0855  AMMONIA 30   ABG    Component Value Date/Time   PHART  7.382 08/17/2024 1220   PCO2ART 38.9 08/17/2024 1220   PO2ART 351 (H) 08/17/2024 1220   HCO3 23.1 08/17/2024 1220   TCO2 24 08/17/2024 1220   ACIDBASEDEF 2.0 08/17/2024 1220   O2SAT 100 08/17/2024 1220    Coagulation Profile: Recent Labs  Lab 08/17/24 1249  INR 1.2   Cardiac Enzymes: No results for input(s): CKTOTAL, CKMB, CKMBINDEX, TROPONINI in the last 168 hours.  HbA1C: Hemoglobin A1C  Date/Time Value Ref Range Status  05/13/2024 08:43 AM 6.1 (A) 4.0 - 5.6 % Final  12/09/2019 12:00 AM 6.7  Final   Hgb A1c MFr Bld  Date/Time Value Ref Range Status  08/12/2024 06:12 AM 6.0 (H) 4.8 - 5.6 % Final    Comment:    (NOTE) Diagnosis of Diabetes The following HbA1c ranges recommended by the American Diabetes Association (ADA) may be used as an aid in the diagnosis of diabetes mellitus.  Hemoglobin             Suggested A1C NGSP%              Diagnosis  <5.7                   Non Diabetic  5.7-6.4                Pre-Diabetic  >6.4                   Diabetic  <7.0                   Glycemic control for                       adults with diabetes.    01/15/2024 08:40 AM 6.1 (H) <5.7 % Final    Comment:    For someone without known diabetes, a hemoglobin  A1c value between 5.7% and 6.4% is consistent with prediabetes and should be confirmed with a  follow-up test. . For someone with known diabetes,  a value <7% indicates that their diabetes is well controlled. A1c targets should be individualized based on duration of diabetes, age, comorbid conditions, and other considerations. . This assay result is consistent with an increased risk of diabetes. . Currently, no consensus exists regarding use of hemoglobin A1c for diagnosis of diabetes for children. .    CBG: Recent Labs  Lab 08/20/24 1549 08/20/24 1929 08/20/24 2319 08/21/24 0323 08/21/24 0813  GLUCAP 167* 160* 178* 180* 196*     Valinda Novas, MD [facility] See Amion  for pager If no response to pager, please call [phone] until 7pm After 7pm, Please call E-link [phone]

## 2024-08-21 NOTE — Progress Notes (Signed)
 Nephrology Follow-Up Consult note   Assessment/Recommendations: Alec [name] is a/an 78 y.o. male with a past medical history significant for CAD, HTN, DM2, COPD, CKD 4 who presents with ICH complicated by AKI on CKD, hypernatremia, altered mental status   Severe AKI on CKD 4: Baseline creatinine around 4.  Likely some degree of uremia.  AKI likely ATN.  He is a poor long-term dialysis candidate.  May require his wife attend dialysis with him outpatient if he makes it that far.  She is still likely interested in dialysis.  Trying to avoid if at all possible.  Fortunately the patient is starting to improve - Creatinine continues to improve today -Continue holding off on dialysis but reevaluate daily -Continue to monitor daily Cr, Dose meds for GFR -Monitor Daily I/Os, Daily weight  -Maintain MAP>65 for optimal renal perfusion.  -Avoid nephrotoxic medications including NSAIDs -Use synthetic opioids (Fentanyl /Dilaudid ) if needed   Severe altered mental status: Likely multifactorial with stroke contributing.  Uremia may have contributed some in the past.  BUN is downtrending..  Treatment of AKI as above.  Other management per primary team   Hypernatremia: Associated with intracranial pathology and decreased access to free water .  Increase enteral free water  today.  D5 water  at 100 cc/h for 10 hours today   Shock: Likely vasoplegic.  Now resolved   Hyperkalemia: Resolved with treatment of AKI   Acute hypoxic respiratory failure: Likely related to altered mental status and inability to protect airway.  Extubated midday by CCM.  Wean oxygen requirements as able   Uncontrolled type 2 diabetes with hyperglycemia: Management per primary team.  Titrate insulin  as needed.   Recommendations conveyed to primary service.    [facility] 08/21/2024 7:33 AM  ___________________________________________________________  CC: Right sided weakness  Interval  History/Subjective: Patient was extubated yesterday.  Awake and alert today but unable to answer questions.  Good urine output.  Creatinine and BUN decreasing.    Medications:  Current Facility-Administered Medications  Medication Dose Route Frequency Provider Last Rate Last Admin   acetaminophen  (TYLENOL ) tablet 650 mg  650 mg Oral Q4H PRN Michaela Aisha SQUIBB, MD       Or   acetaminophen  (TYLENOL ) 160 MG/5ML solution 650 mg  650 mg Per Tube Q4H PRN Michaela Aisha SQUIBB, MD   650 mg at 08/19/24 9049   Or   acetaminophen  (TYLENOL ) suppository 650 mg  650 mg Rectal Q4H PRN Michaela Aisha SQUIBB, MD       allopurinol  (ZYLOPRIM ) tablet 50 mg  50 mg Per Tube Daily Jerri Pfeiffer, MD   50 mg at 08/20/24 1143   Ampicillin -Sulbactam (UNASYN ) 3 g in sodium chloride  0.9 % 100 mL IVPB  3 g Intravenous Q12H Harold Scholz, MD   Stopped at 08/21/24 0015   arformoterol  (BROVANA ) nebulizer solution 15 mcg  15 mcg Nebulization BID Smith, Joshua C, NP   15 mcg at 08/20/24 2033   atorvastatin  (LIPITOR) tablet 40 mg  40 mg Per Tube Daily Jerri Pfeiffer, MD   40 mg at 08/20/24 1145   budesonide  (PULMICORT ) nebulizer solution 0.25 mg  0.25 mg Nebulization BID Smith, Joshua C, NP   0.25 mg at 08/20/24 2033   Chlorhexidine  Gluconate Cloth 2 % PADS 6 each  6 each Topical Q0600 Michaela Aisha SQUIBB, MD   6 each at 08/20/24 0115   dexmedetomidine  (PRECEDEX ) 400 MCG/100ML (4 mcg/mL) infusion  0-0.7 mcg/kg/hr Intravenous Titrated Claudene Chew C, NP 6.67 mL/hr at 08/21/24 0700 0.3  mcg/kg/hr at 08/21/24 0700   dextrose  5 % solution   Intravenous Continuous Macel Jayson PARAS, MD       docusate (COLACE) 50 MG/5ML liquid 100 mg  100 mg Per Tube BID Smith, Joshua C, NP   100 mg at 08/18/24 0916   feeding supplement (GLUCERNA 1.5 CAL) liquid 1,000 mL  1,000 mL Per Tube Continuous Xu, Jindong, MD 55 mL/hr at 08/21/24 0700 Infusion Verify at 08/21/24 0700   feeding supplement (PROSource TF20) liquid 60 mL  60 mL Per Tube Daily  Jerri Pfeiffer, MD   60 mL at 08/20/24 1143   free water  300 mL  300 mL Per Tube Q3H Macel Jayson PARAS, MD       heparin  injection 5,000 Units  5,000 Units Subcutaneous Q8H Jerri Pfeiffer, MD   5,000 Units at 08/21/24 0536   hydrALAZINE  (APRESOLINE ) injection 20 mg  20 mg Intravenous Q4H PRN Remi Pippin, NP   20 mg at 08/20/24 1606   insulin  aspart (novoLOG ) injection 0-20 Units  0-20 Units Subcutaneous Q4H Smith, Joshua C, NP   4 Units at 08/21/24 0400   insulin  glargine-yfgn (SEMGLEE ) injection 35 Units  35 Units Subcutaneous Daily Cindy Garnette POUR, MD   35 Units at 08/20/24 1144   ipratropium-albuterol  (DUONEB) 0.5-2.5 (3) MG/3ML nebulizer solution 3 mL  3 mL Nebulization Q4H PRN Cindy Garnette POUR, MD   3 mL at 08/17/24 0955   labetalol  (NORMODYNE ) injection 40 mg  40 mg Intravenous Q4H PRN Remi Pippin, NP   40 mg at 08/20/24 1803   latanoprost  (XALATAN ) 0.005 % ophthalmic solution 1 drop  1 drop Both Eyes QHS Jerri Pfeiffer, MD   1 drop at 08/20/24 2207   Oral care mouth rinse  15 mL Mouth Rinse PRN Chand, Sudham, MD       Oral care mouth rinse  15 mL Mouth Rinse 4 times per day Chand, Sudham, MD   15 mL at 08/20/24 2206   Oral care mouth rinse  15 mL Mouth Rinse PRN Chand, Sudham, MD       pantoprazole  (PROTONIX ) injection 40 mg  40 mg Intravenous QHS Michaela Aisha SQUIBB, MD   40 mg at 08/20/24 2206   polyethylene glycol (MIRALAX  / GLYCOLAX ) packet 17 g  17 g Per Tube Daily Smith, Joshua C, NP       QUEtiapine  (SEROQUEL ) tablet 25 mg  25 mg Per Tube QHS Jerri Pfeiffer, MD   25 mg at 08/20/24 2206   senna-docusate (Senokot-S) tablet 1 tablet  1 tablet Per Tube BID Fobbs, Rodericks T, RPH   1 tablet at 08/18/24 0916   sertraline  (ZOLOFT ) tablet 50 mg  50 mg Per Tube Daily Marten Larraine HERO, RPH   50 mg at 08/20/24 1145      Review of Systems: Unable to obtain due to the patient's confusion  Physical Exam: Vitals:   08/21/24 0600 08/21/24 0700  BP: (!) 137/58 (!) 146/63  Pulse: 70 65  Resp: 19  (!) 9  Temp:    SpO2: 96% 96%   No intake/output data recorded.  Intake/Output Summary (Last 24 hours) at 08/21/2024 9266 Last data filed at 08/21/2024 0700 Gross per 24 hour  Intake 3360.66 ml  Output 3250 ml  Net 110.66 ml   Constitutional: Lying in bed, no distress ENMT: ears and nose without scars or lesions, MMM CV: normal rate, no edema Respiratory: Bilateral chest rise, ventilated Gastrointestinal: soft, non-tender, no palpable masses or hernias Skin: no visible lesions or  rashes Psych: Awake and alert, minimally interactive   Test Results I personally reviewed new and old clinical labs and radiology tests Lab Results  Component Value Date   NA 147 (H) 08/21/2024   K 4.9 08/21/2024   CL 116 (H) 08/21/2024   CO2 20 (L) 08/21/2024   BUN 98 (H) 08/21/2024   CREATININE 4.02 (H) 08/21/2024   GFR 23.26 (L) 07/10/2023   CALCIUM  8.5 (L) 08/21/2024   ALBUMIN  3.0 (L) 08/17/2024   PHOS 4.7 (H) 08/21/2024    CBC Recent Labs  Lab 08/18/24 0243 08/19/24 0235 08/20/24 0331 08/21/24 0442  WBC 3.2* 2.5* 2.1* 1.9*  NEUTROABS 1.8  --   --   --   HGB 11.3* 10.1* 9.6* 9.0*  HCT 34.7* 30.9* 29.3* 27.1*  MCV 94.8 96.0 95.1 95.4  PLT 113* PLATELET CLUMPS NOTED ON SMEAR, UNABLE TO ESTIMATE 79* 83*

## 2024-08-22 DIAGNOSIS — J690 Pneumonitis due to inhalation of food and vomit: Secondary | ICD-10-CM

## 2024-08-22 DIAGNOSIS — I610 Nontraumatic intracerebral hemorrhage in hemisphere, subcortical: Secondary | ICD-10-CM | POA: Diagnosis not present

## 2024-08-22 DIAGNOSIS — J9601 Acute respiratory failure with hypoxia: Secondary | ICD-10-CM

## 2024-08-22 DIAGNOSIS — L8990 Pressure ulcer of unspecified site, unspecified stage: Secondary | ICD-10-CM

## 2024-08-22 DIAGNOSIS — I10 Essential (primary) hypertension: Secondary | ICD-10-CM

## 2024-08-22 DIAGNOSIS — G9349 Other encephalopathy: Secondary | ICD-10-CM

## 2024-08-22 DIAGNOSIS — N184 Chronic kidney disease, stage 4 (severe): Secondary | ICD-10-CM | POA: Diagnosis not present

## 2024-08-22 DIAGNOSIS — N19 Unspecified kidney failure: Secondary | ICD-10-CM

## 2024-08-22 DIAGNOSIS — E872 Acidosis, unspecified: Secondary | ICD-10-CM

## 2024-08-22 DIAGNOSIS — J449 Chronic obstructive pulmonary disease, unspecified: Secondary | ICD-10-CM

## 2024-08-22 DIAGNOSIS — R1310 Dysphagia, unspecified: Secondary | ICD-10-CM

## 2024-08-22 DIAGNOSIS — E870 Hyperosmolality and hypernatremia: Secondary | ICD-10-CM

## 2024-08-22 LAB — GLUCOSE, CAPILLARY
Glucose-Capillary: 163 mg/dL — ABNORMAL HIGH (ref 70–99)
Glucose-Capillary: 157 mg/dL — ABNORMAL HIGH (ref 70–99)
Glucose-Capillary: 138 mg/dL — ABNORMAL HIGH (ref 70–99)
Glucose-Capillary: 102 mg/dL — ABNORMAL HIGH (ref 70–99)
Glucose-Capillary: 119 mg/dL — ABNORMAL HIGH (ref 70–99)
Glucose-Capillary: 126 mg/dL — ABNORMAL HIGH (ref 70–99)

## 2024-08-22 LAB — CBC
HCT: 28.3 % — ABNORMAL LOW (ref 39.0–52.0)
Hemoglobin: 9.5 g/dL — ABNORMAL LOW (ref 13.0–17.0)
MCH: 31.4 pg (ref 26.0–34.0)
MCHC: 33.6 g/dL (ref 30.0–36.0)
MCV: 93.4 fL (ref 80.0–100.0)
Platelets: 90 K/uL — ABNORMAL LOW (ref 150–400)
RBC: 3.03 MIL/uL — ABNORMAL LOW (ref 4.22–5.81)
RDW: 12.7 % (ref 11.5–15.5)
WBC: 3.7 K/uL — ABNORMAL LOW (ref 4.0–10.5)
nRBC: 0 % (ref 0.0–0.2)

## 2024-08-22 LAB — BASIC METABOLIC PANEL WITH GFR
Anion gap: 10 (ref 5–15)
BUN: 80 mg/dL — ABNORMAL HIGH (ref 8–23)
CO2: 19 mmol/L — ABNORMAL LOW (ref 22–32)
Calcium: 8.5 mg/dL — ABNORMAL LOW (ref 8.9–10.3)
Chloride: 114 mmol/L — ABNORMAL HIGH (ref 98–111)
Creatinine, Ser: 3.5 mg/dL — ABNORMAL HIGH (ref 0.61–1.24)
GFR, Estimated: 17 mL/min — ABNORMAL LOW (ref >60)
Glucose, Bld: 160 mg/dL — ABNORMAL HIGH (ref 70–99)
Potassium: 4.9 mmol/L (ref 3.5–5.1)
Sodium: 143 mmol/L (ref 135–145)

## 2024-08-22 LAB — MAGNESIUM: Magnesium: 2.3 mg/dL (ref 1.7–2.4)

## 2024-08-22 LAB — PHOSPHORUS: Phosphorus: 3.6 mg/dL (ref 2.5–4.6)

## 2024-08-22 MED ORDER — HALOPERIDOL LACTATE 5 MG/ML IJ SOLN
1.0000 mg | Freq: Four times a day (QID) | INTRAMUSCULAR | Status: DC | PRN
  Administered 2024-08-22 – 2024-08-28 (×6): 1 mg via INTRAVENOUS
  Filled 2024-08-22 (×6): qty 1

## 2024-08-22 MED ORDER — AMLODIPINE BESYLATE 5 MG PO TABS
5.0000 mg | ORAL_TABLET | Freq: Every day | ORAL | Status: DC
  Administered 2024-08-22 – 2024-08-23 (×2): 5 mg via ORAL
  Filled 2024-08-22 (×2): qty 1

## 2024-08-22 MED ORDER — LACTATED RINGERS IV SOLN: INTRAVENOUS | Status: AC

## 2024-08-22 MED ORDER — SODIUM BICARBONATE 650 MG PO TABS
650.0000 mg | ORAL_TABLET | Freq: Two times a day (BID) | ORAL | Status: DC
  Administered 2024-08-22 – 2024-08-23 (×3): 650 mg via ORAL
  Filled 2024-08-22 (×3): qty 1

## 2024-08-22 NOTE — Progress Notes (Signed)
 Speech Language Pathology Treatment: Dysphagia  Patient Details Name: Alec [name] MRN: [id number] DOB: 1946/01/17 Today's Date: 08/22/2024 Time: 9154-9094 SLP Time Calculation (min) (ACUTE ONLY): 20 min  Assessment / Plan / Recommendation Clinical Impression  Received a message from RN/MD that patient had pulled out his Cortrak, requested SLP re-assess swallowing. Diagnostic treatment complete. Patient alert but restless, removing covers, one leg off the bed. With assistance from RN, repositioned patient for increased safety with po intake. Patient able to consume clinician provided po trials today including ice chips, thin liquids, and pureed solids with improved oral transit time and no overt s/s of aspiration across trials. He does however continue to present with moderate anterior labial spillage of thin liquids due to continued significant right sided facial weakness and suspected delayed swallow initiation per palpation/timing of swallow as compared to base of tongue movement. Mild right sided buccal residue present post swallow with pureed solids however patient was able to clear majority of bolus with cues for a second swallow. No Cortrak team present today to replace feeding tube. Recommend patient remain NPO except meds crushed in puree, RN checking for right sided buccal residue post swallow, and oral care following intake. Based on today's presentation, patient appropriate for repeat instrumental study as given current degree of deficits, risk of silent aspiration high. Will complete ASAP, likely next date.    HPI HPI: Alec [name] [name] is a 78 y.o. male who presented 11/11 with right-sided weakness and aphasia. Head CT with large L hemorrage.   CT 11/12: Interval increase in size of left basal ganglia hemorrhage, now  measuring 3.2 x 3.6 x 2.9 cm (estimated volume 17 mL, previously 11  mL). Mild increase in surrounding edema without significant midline  shift. Pt became obtunded  on 11/18 and was intubated and transferred to ICU.  Pt with hx of diabetes, hypertension, CAD, CHF, RA, COPD      SLP Plan  MBS          Recommendations  Diet recommendations: NPO;Other(comment) (except meds crushed in puree) Medication Administration: Crushed with puree                  Oral care QID;Other (Comment) (oral care after meds)   Frequent or constant Supervision/Assistance Dysphagia, unspecified (R13.10)     MBS    Ayshia Gramlich MA, CCC-SLP  Kiegan Macaraeg Meryl  08/22/2024, 9:15 AM

## 2024-08-22 NOTE — Plan of Care (Signed)
  Problem: Coping: Goal: Will verbalize positive feelings about self Outcome: Progressing   Problem: Health Behavior/Discharge Planning: Goal: Ability to manage health-related needs will improve Outcome: Progressing   Problem: Self-Care: Goal: Ability to participate in self-care as condition permits will improve Outcome: Progressing

## 2024-08-22 NOTE — Assessment & Plan Note (Signed)
 Bicarb of 19, likely secondary to CKD. - Starting on p.o. bicarb supplement -Continue to monitor

## 2024-08-22 NOTE — Progress Notes (Signed)
 0820: Pt pulled cortrak out even with mitts on hands. Dr. Caleen aware.

## 2024-08-22 NOTE — Progress Notes (Signed)
 Progress Note   Patient: Alec [name] FMW:[id number] DOB: [birth year] DOA: 08/10/2024     12 DOS: the patient was seen and examined on 08/22/2024   Brief hospital course: 78yo with hx DM, HTN, CAD, CHF, RA, COPD who initially presented on 11/11 with R sided weakness and aphasia, found to have L sided subcortical ICH suspected due to uncontrolled hypertension.  Overnight on 11/19 into 11/18 patient developed severe respiratory distress and was intubated.  Extubated on 11/21 and later transition to room air.  Patient also developed uremic encephalopathy secondary to AKI with underlying CKD stage IV, had hypernatremia, nephrology was consulted and patient received IV fluid which resulted in improvement of hypernatremia and slowly improving renal function. Developed hyperkalemia which has been resolved.  Patient also developed pancytopenia secondary to critical illness which is now improving.  He also received a course of Unasyn  for concern of aspiration pneumonia.  Respiratory cultures growing mixed flora.  Intraparenchymal hemorrhage in the left basal ganglia remained stable, continued midline shift to the right by approximately 5 mm which was stable on repeat CT done on 11/18. Persistent dysphagia-being tube fed. PT is recommending SNF  11/23: Blood pressure remained mildly elevated, pancytopenia improving, hypernatremia resolved, improving renal function with BUN of 80, creatinine 3.50, which is his baseline.  CO2 19.  Patient accidentally pulled out his corTrack.  Repeat swallow evaluation ordered, continuing LR. Able to take meds in applesauce.  Assessment and Plan: * ICH (intracerebral hemorrhage) (HCC) Stable with midline shift, unlikely secondary to uncontrolled hypertension. -Continue holding antiplatelets. - PT is recommending SNF  Acute respiratory failure with hypoxia (HCC) Patient developed acute hypoxic respiratory failure requiring intubation, extubated on 11/21 and  now weaned back to room air. -Continue to monitor -Supplemental oxygen as needed  Hypertension Blood pressure mildly elevated. Patient was on multiple antihypertensives at home with concern of being noncompliant. He was not on any antihypertensives when transferred out of ICU, likely some concern of hypotension with acute decompensation. -Starting on low-dose amlodipine  -As needed hydralazine  and labetalol  -We will add further antihypertensives as needed  Uremic encephalopathy Concern of acute metabolic/uremic encephalopathy which is now improving.  CKD (chronic kidney disease), stage IV (HCC) Patient developed AKI with underlying CKD stage IV during critical illness. Nephrology is on board-not a good candidate for long-term dialysis. - Renal functions now improving and at baseline now. - Monitor renal function -Avoid nephrotoxins  Type 2 diabetes mellitus with chronic kidney disease, with long-term current use of insulin  (HCC) CBG currently within goal. -Continue with Semglee  and SSI  Aspiration pneumonia (HCC) Patient was started on Unasyn  in ICU for concern of aspiration pneumonia. - Continue Unasyn  to complete a 7-day course  COPD (chronic obstructive pulmonary disease) (HCC) No wheezing today. -Continue with bronchodilators  Dysphagia Patient had corTrak in place for feeding which was accidentally removed today. Able to take medications in applesauce. -Swallow team was reconsulted to reevaluate. - Will continue with LR -If unable to swallow then need to replace feeding tube  Hypernatremia Resolved today. - Continue with hydration  Metabolic acidosis Bicarb of 19, likely secondary to CKD. - Starting on p.o. bicarb supplement -Continue to monitor  Hyperlipidemia - Continue with Lipitor      Subjective: Patient was seen and examined today.  He was feeling tired after getting cleaned up.  Resting but easily arousable.  Denies any pain.  Wife at  bedside.  Physical Exam: Vitals:   08/22/24 0439 08/22/24 0740 08/22/24 0824 08/22/24 1128  BP:  ROLLEN)  154/63  (!) 144/59  Pulse:  65  66  Resp:  20  16  Temp:  (!) 97.4 F (36.3 C)  98.5 F (36.9 C)  TempSrc:  Oral  Oral  SpO2:  99% 96% 97%  Weight: 90.8 kg     Height:       General.  Obese and frail elderly man, in no acute distress. Pulmonary.  Lungs clear bilaterally, normal respiratory effort. CV.  Regular rate and rhythm, no JVD, rub or murmur. Abdomen.  Soft, nontender, nondistended, BS positive. CNS.  Alert and oriented .  No focal neurologic deficit. Extremities.  No edema, no cyanosis, pulses intact and symmetrical.  Data Reviewed: Prior data reviewed  Family Communication: Discussed with wife at bedside  Disposition: Status is: Inpatient Remains inpatient appropriate because: Severity of illness  Planned Discharge Destination: Skilled nursing facility  DVT prophylaxis.  Subcu heparin  Time spent: 50 minutes  This record has been created using Conservation officer, historic buildings. Errors have been sought and corrected,but may not always be located. Such creation errors do not reflect on the standard of care.   Author: Amaryllis Dare, MD 08/22/2024 12:40 PM  For on call review [email or web address].

## 2024-08-22 NOTE — Assessment & Plan Note (Signed)
 Continue with core

## 2024-08-22 NOTE — Progress Notes (Signed)
 Nephrology Follow-Up Consult note   Assessment/Recommendations: Alec [name] is a/an 78 y.o. male with a past medical history significant for CAD, HTN, DM2, COPD, CKD 4 who presents with ICH complicated by AKI on CKD, hypernatremia, altered mental status   Severe AKI on CKD 4: Baseline creatinine around 4.  Likely some degree of uremia earlier in the course.  AKI likely ATN.  He is a poor long-term dialysis candidate.  May require his wife attend dialysis with him outpatient if he makes it that far.  She is still likely interested in dialysis even if that is necessary but we help off on initiating in hopes for recovery. Now with several days of improvement and no obvious uremia. Crt back to his baseline so will sign off.   - Creatinine continues to improve today -Continue to monitor daily Cr, Dose meds for GFR -Monitor Daily I/Os, Daily weight  -Maintain MAP>65 for optimal renal perfusion.  -Avoid nephrotoxic medications including NSAIDs -Use synthetic opioids (Fentanyl /Dilaudid ) if needed   Severe altered mental status: Likely multifactorial with stroke contributing.  Uremia may have contributed some in the past.  BUN much improved.   Hypernatremia: Associated with intracranial pathology and decreased access to free water .  Increase enteral free water  as able     Acute hypoxic respiratory failure: Likely related to altered mental status and inability to protect airway.  Much improved   Uncontrolled type 2 diabetes with hyperglycemia: Management per primary team.  Titrate insulin  as needed.   Recommendations conveyed to primary service.    [facility] 08/22/2024 9:03 AM  ___________________________________________________________  CC: Right sided weakness  Interval History/Subjective: Patient lying in bed no family present. Unable to voice complaints.    Medications:  Current Facility-Administered Medications  Medication Dose Route Frequency  Provider Last Rate Last Admin   acetaminophen  (TYLENOL ) tablet 650 mg  650 mg Oral Q4H PRN Michaela Aisha SQUIBB, MD       Or   acetaminophen  (TYLENOL ) 160 MG/5ML solution 650 mg  650 mg Per Tube Q4H PRN Michaela Aisha SQUIBB, MD   650 mg at 08/19/24 9049   Or   acetaminophen  (TYLENOL ) suppository 650 mg  650 mg Rectal Q4H PRN Michaela Aisha SQUIBB, MD       allopurinol  (ZYLOPRIM ) tablet 50 mg  50 mg Per Tube Daily Jerri Pfeiffer, MD   50 mg at 08/21/24 9075   Ampicillin -Sulbactam (UNASYN ) 3 g in sodium chloride  0.9 % 100 mL IVPB  3 g Intravenous Q12H Harold Scholz, MD   Stopped at 08/21/24 2345   arformoterol  (BROVANA ) nebulizer solution 15 mcg  15 mcg Nebulization BID Smith, Joshua C, NP   15 mcg at 08/22/24 9175   atorvastatin  (LIPITOR) tablet 40 mg  40 mg Per Tube Daily Jerri Pfeiffer, MD   40 mg at 08/21/24 9075   budesonide  (PULMICORT ) nebulizer solution 0.25 mg  0.25 mg Nebulization BID Smith, Joshua C, NP   0.25 mg at 08/22/24 9175   Chlorhexidine  Gluconate Cloth 2 % PADS 6 each  6 each Topical Q0600 Michaela Aisha SQUIBB, MD   6 each at 08/22/24 0536   docusate (COLACE) 50 MG/5ML liquid 100 mg  100 mg Per Tube BID Smith, Joshua C, NP   100 mg at 08/21/24 2114   feeding supplement (GLUCERNA 1.5 CAL) liquid 1,000 mL  1,000 mL Per Tube Continuous Xu, Jindong, MD   Stopped at 08/22/24 0838   feeding supplement (PROSource TF20) liquid 60 mL  60 mL  Per Tube Daily Jerri Pfeiffer, MD   60 mL at 08/21/24 0924   free water  300 mL  300 mL Per Tube Q3H Macel Jayson PARAS, MD   300 mL at 08/22/24 0536   heparin  injection 5,000 Units  5,000 Units Subcutaneous Q8H Jerri Pfeiffer, MD   5,000 Units at 08/22/24 0535   hydrALAZINE  (APRESOLINE ) injection 20 mg  20 mg Intravenous Q4H PRN Remi Pippin, NP   20 mg at 08/21/24 2124   insulin  aspart (novoLOG ) injection 0-20 Units  0-20 Units Subcutaneous Q4H Smith, Joshua C, NP   4 Units at 08/22/24 9562   insulin  glargine-yfgn (SEMGLEE ) injection 38 Units  38 Units  Subcutaneous Daily Harold Scholz, MD   38 Units at 08/21/24 1008   ipratropium-albuterol  (DUONEB) 0.5-2.5 (3) MG/3ML nebulizer solution 3 mL  3 mL Nebulization Q4H PRN Cindy Garnette POUR, MD   3 mL at 08/17/24 0955   labetalol  (NORMODYNE ) injection 40 mg  40 mg Intravenous Q4H PRN Remi Pippin, NP   40 mg at 08/22/24 0042   latanoprost  (XALATAN ) 0.005 % ophthalmic solution 1 drop  1 drop Both Eyes QHS Jerri Pfeiffer, MD   1 drop at 08/21/24 2115   Oral care mouth rinse  15 mL Mouth Rinse PRN Harold Scholz, MD       Oral care mouth rinse  15 mL Mouth Rinse 4 times per day Harold Scholz, MD   15 mL at 08/21/24 2115   Oral care mouth rinse  15 mL Mouth Rinse PRN Harold Scholz, MD       pantoprazole  (PROTONIX ) injection 40 mg  40 mg Intravenous QHS Michaela Aisha SQUIBB, MD   40 mg at 08/21/24 2114   polyethylene glycol (MIRALAX  / GLYCOLAX ) packet 17 g  17 g Per Tube Daily Claudene Fonda BROCKS, NP       QUEtiapine  (SEROQUEL ) tablet 25 mg  25 mg Per Tube QHS Jerri Pfeiffer, MD   25 mg at 08/21/24 2114   senna-docusate (Senokot-S) tablet 1 tablet  1 tablet Per Tube BID Fobbs, Rodericks T, RPH   1 tablet at 08/18/24 0916   sertraline  (ZOLOFT ) tablet 50 mg  50 mg Per Tube Daily Marten Larraine HERO, RPH   50 mg at 08/21/24 9075      Review of Systems: Unable to obtain due to the patient's confusion  Physical Exam: Vitals:   08/22/24 0341 08/22/24 0740  BP: (!) 157/60 (!) 154/63  Pulse: 67 65  Resp: 18 20  Temp: 98.7 F (37.1 C) (!) 97.4 F (36.3 C)  SpO2: 100% 99%   Total I/O In: 254.8 [NG/GT:254.8] Out: -   Intake/Output Summary (Last 24 hours) at 08/22/2024 9096 Last data filed at 08/22/2024 9166 Gross per 24 hour  Intake 5131.65 ml  Output 2800 ml  Net 2331.65 ml   Constitutional: Lying in bed, no distress ENMT: ears and nose without scars or lesions, MMM CV: normal rate, no edema Respiratory: Bilateral chest rise, ventilated Gastrointestinal: soft, non-tender, no palpable masses or  hernias Skin: no visible lesions or rashes Psych: Awake and alert, unable to answer questions   Test Results I personally reviewed new and old clinical labs and radiology tests Lab Results  Component Value Date   NA 143 08/22/2024   K 4.9 08/22/2024   CL 114 (H) 08/22/2024   CO2 19 (L) 08/22/2024   BUN 80 (H) 08/22/2024   CREATININE 3.50 (H) 08/22/2024   GFR 23.26 (L) 07/10/2023   CALCIUM  8.5 (L) 08/22/2024  ALBUMIN  3.0 (L) 08/17/2024   PHOS 3.6 08/22/2024    CBC Recent Labs  Lab 08/18/24 0243 08/19/24 0235 08/20/24 0331 08/21/24 0442 08/22/24 0414  WBC 3.2*   < > 2.1* 1.9* 3.7*  NEUTROABS 1.8  --   --   --   --   HGB 11.3*   < > 9.6* 9.0* 9.5*  HCT 34.7*   < > 29.3* 27.1* 28.3*  MCV 94.8   < > 95.1 95.4 93.4  PLT 113*   < > 79* 83* 90*   < > = values in this interval not displayed.

## 2024-08-22 NOTE — Assessment & Plan Note (Signed)
 Patient developed AKI with underlying CKD stage IV during critical illness. Nephrology is on board-not a good candidate for long-term dialysis. - Renal functions now improving and at baseline now. - Monitor renal function -Avoid nephrotoxins

## 2024-08-22 NOTE — Assessment & Plan Note (Addendum)
 CBG currently within goal. - Continue with Semglee  and SSI

## 2024-08-22 NOTE — Assessment & Plan Note (Signed)
 Blood pressure mildly elevated. Patient was on multiple antihypertensives at home with concern of being noncompliant. He was not on any antihypertensives when transferred out of ICU, likely some concern of hypotension with acute decompensation. -Starting on low-dose amlodipine  -As needed hydralazine  and labetalol  -We will add further antihypertensives as needed

## 2024-08-22 NOTE — Assessment & Plan Note (Signed)
 Concern of acute metabolic/uremic encephalopathy which is now improving.

## 2024-08-22 NOTE — Assessment & Plan Note (Signed)
 No wheezing today. -Continue with bronchodilators

## 2024-08-22 NOTE — Assessment & Plan Note (Signed)
 Patient had corTrak in place for feeding which was accidentally removed today. Able to take medications in applesauce. -Swallow team was reconsulted to reevaluate. - Will continue with LR -If unable to swallow then need to replace feeding tube

## 2024-08-22 NOTE — Assessment & Plan Note (Signed)
 Stable with midline shift, unlikely secondary to uncontrolled hypertension. -Continue holding antiplatelets. - PT is recommending SNF

## 2024-08-22 NOTE — Assessment & Plan Note (Signed)
 Patient developed acute hypoxic respiratory failure requiring intubation, extubated on 11/21 and now weaned back to room air. -Continue to monitor -Supplemental oxygen as needed

## 2024-08-22 NOTE — Assessment & Plan Note (Signed)
 Resolved today. - Continue with hydration

## 2024-08-22 NOTE — Assessment & Plan Note (Signed)
 Patient was started on Unasyn  in ICU for concern of aspiration pneumonia. - Continue Unasyn  to complete a 7-day course

## 2024-08-23 ENCOUNTER — Encounter (HOSPITAL_BASED_OUTPATIENT_CLINIC_OR_DEPARTMENT_OTHER)

## 2024-08-23 ENCOUNTER — Inpatient Hospital Stay (HOSPITAL_COMMUNITY)

## 2024-08-23 ENCOUNTER — Ambulatory Visit (HOSPITAL_BASED_OUTPATIENT_CLINIC_OR_DEPARTMENT_OTHER)

## 2024-08-23 DIAGNOSIS — I10 Essential (primary) hypertension: Secondary | ICD-10-CM | POA: Diagnosis not present

## 2024-08-23 DIAGNOSIS — J9601 Acute respiratory failure with hypoxia: Secondary | ICD-10-CM | POA: Diagnosis not present

## 2024-08-23 DIAGNOSIS — N184 Chronic kidney disease, stage 4 (severe): Secondary | ICD-10-CM | POA: Diagnosis not present

## 2024-08-23 DIAGNOSIS — I610 Nontraumatic intracerebral hemorrhage in hemisphere, subcortical: Secondary | ICD-10-CM | POA: Diagnosis not present

## 2024-08-23 LAB — BASIC METABOLIC PANEL WITH GFR
Anion gap: 14 (ref 5–15)
BUN: 68 mg/dL — ABNORMAL HIGH (ref 8–23)
CO2: 18 mmol/L — ABNORMAL LOW (ref 22–32)
Calcium: 9 mg/dL (ref 8.9–10.3)
Chloride: 114 mmol/L — ABNORMAL HIGH (ref 98–111)
Creatinine, Ser: 3.5 mg/dL — ABNORMAL HIGH (ref 0.61–1.24)
GFR, Estimated: 17 mL/min — ABNORMAL LOW (ref >60)
Glucose, Bld: 88 mg/dL (ref 70–99)
Potassium: 5.1 mmol/L (ref 3.5–5.1)
Sodium: 146 mmol/L — ABNORMAL HIGH (ref 135–145)

## 2024-08-23 LAB — GLUCOSE, CAPILLARY
Glucose-Capillary: 106 mg/dL — ABNORMAL HIGH (ref 70–99)
Glucose-Capillary: 117 mg/dL — ABNORMAL HIGH (ref 70–99)
Glucose-Capillary: 130 mg/dL — ABNORMAL HIGH (ref 70–99)
Glucose-Capillary: 148 mg/dL — ABNORMAL HIGH (ref 70–99)
Glucose-Capillary: 84 mg/dL (ref 70–99)
Glucose-Capillary: 87 mg/dL (ref 70–99)

## 2024-08-23 LAB — CBC
HCT: 31.7 % — ABNORMAL LOW (ref 39.0–52.0)
Hemoglobin: 10.7 g/dL — ABNORMAL LOW (ref 13.0–17.0)
MCH: 31.6 pg (ref 26.0–34.0)
MCHC: 33.8 g/dL (ref 30.0–36.0)
MCV: 93.5 fL (ref 80.0–100.0)
Platelets: 103 K/uL — ABNORMAL LOW (ref 150–400)
RBC: 3.39 MIL/uL — ABNORMAL LOW (ref 4.22–5.81)
RDW: 12.7 % (ref 11.5–15.5)
WBC: 3.2 K/uL — ABNORMAL LOW (ref 4.0–10.5)
nRBC: 0 % (ref 0.0–0.2)

## 2024-08-23 LAB — MAGNESIUM: Magnesium: 2.2 mg/dL (ref 1.7–2.4)

## 2024-08-23 LAB — PHOSPHORUS: Phosphorus: 3.9 mg/dL (ref 2.5–4.6)

## 2024-08-23 MED ORDER — QUETIAPINE FUMARATE 25 MG PO TABS
25.0000 mg | ORAL_TABLET | Freq: Every day | ORAL | Status: DC
  Administered 2024-08-23 – 2024-09-03 (×12): 25 mg via ORAL
  Filled 2024-08-23 (×12): qty 1

## 2024-08-23 MED ORDER — AMLODIPINE BESYLATE 5 MG PO TABS: 10.0000 mg | ORAL_TABLET | Freq: Every day | ORAL | Status: DC

## 2024-08-23 MED ORDER — ADULT MULTIVITAMIN W/MINERALS CH
1.0000 | ORAL_TABLET | Freq: Every day | ORAL | Status: DC
  Administered 2024-08-23 – 2024-09-04 (×13): 1 via ORAL
  Filled 2024-08-23 (×13): qty 1

## 2024-08-23 MED ORDER — AMLODIPINE BESYLATE 5 MG PO TABS: 5.0000 mg | ORAL_TABLET | Freq: Once | ORAL | Status: DC

## 2024-08-23 MED ORDER — SENNOSIDES-DOCUSATE SODIUM 8.6-50 MG PO TABS
1.0000 | ORAL_TABLET | Freq: Two times a day (BID) | ORAL | Status: DC
  Administered 2024-08-25 – 2024-09-04 (×17): 1 via ORAL
  Filled 2024-08-23 (×18): qty 1

## 2024-08-23 MED ORDER — AMLODIPINE BESYLATE 5 MG PO TABS
10.0000 mg | ORAL_TABLET | Freq: Every day | ORAL | Status: DC
  Administered 2024-08-24 – 2024-09-01 (×9): 10 mg via ORAL
  Filled 2024-08-23 (×9): qty 2

## 2024-08-23 MED ORDER — NEPRO/CARBSTEADY PO LIQD: 237.0000 mL | Freq: Two times a day (BID) | ORAL | Status: DC

## 2024-08-23 MED ORDER — ENSURE PLUS HIGH PROTEIN PO LIQD: 237.0000 mL | Freq: Two times a day (BID) | ORAL | Status: DC

## 2024-08-23 MED ORDER — POLYETHYLENE GLYCOL 3350 17 G PO PACK
17.0000 g | PACK | Freq: Every day | ORAL | Status: DC
  Administered 2024-08-26 – 2024-08-30 (×4): 17 g via ORAL
  Filled 2024-08-23 (×6): qty 1

## 2024-08-23 MED ORDER — AMLODIPINE BESYLATE 5 MG PO TABS
5.0000 mg | ORAL_TABLET | Freq: Once | ORAL | Status: AC
  Administered 2024-08-23: 5 mg via ORAL
  Filled 2024-08-23: qty 1

## 2024-08-23 MED ORDER — ATORVASTATIN CALCIUM 40 MG PO TABS
40.0000 mg | ORAL_TABLET | Freq: Every day | ORAL | Status: DC
  Administered 2024-08-24 – 2024-09-04 (×12): 40 mg via ORAL
  Filled 2024-08-23 (×12): qty 1

## 2024-08-23 MED ORDER — PROSOURCE PLUS PO LIQD
30.0000 mL | Freq: Two times a day (BID) | ORAL | Status: DC
  Administered 2024-08-23: 30 mL via ORAL
  Filled 2024-08-23: qty 30

## 2024-08-23 MED ORDER — SODIUM BICARBONATE 650 MG PO TABS
1300.0000 mg | ORAL_TABLET | Freq: Two times a day (BID) | ORAL | Status: DC
  Administered 2024-08-23 – 2024-09-04 (×24): 1300 mg via ORAL
  Filled 2024-08-23 (×24): qty 2

## 2024-08-23 MED ORDER — ALLOPURINOL 100 MG PO TABS
50.0000 mg | ORAL_TABLET | Freq: Every day | ORAL | Status: DC
  Administered 2024-08-24 – 2024-09-04 (×12): 50 mg via ORAL
  Filled 2024-08-23 (×12): qty 1

## 2024-08-23 MED ORDER — SERTRALINE HCL 50 MG PO TABS
50.0000 mg | ORAL_TABLET | Freq: Every day | ORAL | Status: DC
  Administered 2024-08-24 – 2024-09-04 (×12): 50 mg via ORAL
  Filled 2024-08-23 (×12): qty 1

## 2024-08-23 MED ORDER — INSULIN GLARGINE-YFGN 100 UNIT/ML ~~LOC~~ SOLN
28.0000 [IU] | Freq: Every day | SUBCUTANEOUS | Status: DC
  Administered 2024-08-24 – 2024-09-04 (×12): 28 [IU] via SUBCUTANEOUS
  Filled 2024-08-23 (×12): qty 0.28

## 2024-08-23 MED ORDER — LACTATED RINGERS IV SOLN: INTRAVENOUS | Status: DC

## 2024-08-23 NOTE — Assessment & Plan Note (Signed)
 Bicarb of 18, likely secondary to CKD. - Increasing the dose of p.o. bicarb tablets -Continue to monitor

## 2024-08-23 NOTE — Assessment & Plan Note (Signed)
 Patient developed AKI with underlying CKD stage IV during critical illness. Nephrology is on board-not a good candidate for long-term dialysis. - Renal functions now improving and at baseline now. - Monitor renal function -Avoid nephrotoxins

## 2024-08-23 NOTE — NC FL2 (Signed)
 [place]  MEDICAID FL2 LEVEL OF CARE FORM     IDENTIFICATION  Patient Name: Alec [name] Birthdate: 1946/04/20 Sex: male Admission Date (Current Location): 08/10/2024  [facility] and [place] Number:  Producer, Television/film/video and Address:  The . [facility], [address] [place], [place] [address]      Provider Number: [id number]  Attending Physician Name and Address:  Caleen Qualia, MD  Relative Name and Phone Number:       Current Level of Care: Hospital Recommended Level of Care: Skilled Nursing Facility Prior Approval Number:    Date Approved/Denied:   PASRR Number: [id number]  Discharge Plan: SNF    Current Diagnoses: Patient Active Problem List   Diagnosis Date Noted   Pressure injury of skin 08/22/2024   Dysphagia 08/22/2024   Acute respiratory failure with hypoxia (HCC) 08/22/2024   Aspiration pneumonia (HCC) 08/22/2024   Hypernatremia 08/22/2024   Metabolic acidosis 08/22/2024   Uremic encephalopathy 08/22/2024   ICH (intracerebral hemorrhage) (HCC) 08/10/2024   Paronychia of great toe of right foot 07/25/2023   Type 2 diabetes mellitus with diabetic neuropathy, with long-term current use of insulin  (HCC) 07/19/2023   COPD (chronic obstructive pulmonary disease) (HCC) 03/14/2023   Gout 03/14/2023   OSA (obstructive sleep apnea) 03/14/2023   Depression 03/14/2023   BPH (benign prostatic hyperplasia) 03/14/2023   Acute on chronic diastolic (congestive) heart failure (HCC) 03/13/2023   CKD (chronic kidney disease), stage IV (HCC) 04/03/2022   AKI (acute kidney injury) 03/28/2022   Snoring 07/18/2020   Excessive sleepiness 07/18/2020   Witnessed episode of apnea 07/18/2020   Laryngopharyngeal reflux (LPR) 07/04/2020   Lesion of nasal septum 07/04/2020   Leg swelling 04/30/2020   Bradycardia 04/30/2020   Malignant neoplasm of prostate (HCC) 04/18/2020   Educated about COVID-19 virus infection 10/26/2019   Rheumatoid arthritis of  multiple sites with negative rheumatoid factor (HCC) 09/06/2019   Shortness of breath 11/10/2018   Cough 11/10/2018   Wheezing 11/10/2018   Coronary artery disease involving native coronary artery of native heart without angina pectoris 11/10/2018   Dyslipidemia 11/10/2018   CRI (chronic renal insufficiency), stage 3 (moderate) 08/06/2018   S/P CABG x 3 07/20/2018   Angina, class II 07/14/2018   Abnormal stress electrocardiography using treadmill 07/14/2018   Cellulitis of left arm 12/10/2017   Cellulitis 12/09/2017   Diabetic nephropathy associated with type 2 diabetes mellitus (HCC) 12/14/2014   Hypertension 12/14/2014   Type 2 diabetes mellitus with chronic kidney disease, with long-term current use of insulin  (HCC) 08/29/2012   Hyperlipidemia 08/29/2012   Abdominal pain 08/29/2012    Orientation RESPIRATION BLADDER Height & Weight     Self  Normal Incontinent Weight: 195 lb 5.2 oz (88.6 kg) Height:  5' 6 (167.6 cm)  BEHAVIORAL SYMPTOMS/MOOD NEUROLOGICAL BOWEL NUTRITION STATUS      Incontinent Diet (see DC summary)  AMBULATORY STATUS COMMUNICATION OF NEEDS Skin   Extensive Assist Verbally PU Stage and Appropriate Care, Skin abrasions (traumatic right elbow: foam dressing, lift every shift to assess and change PRN) PU Stage 1 Dressing:  (sacrum: foam dressing, lift every shift to assess and change PRN; left heel: foam dressing, lift every shift to assess and change PRN)                     Personal Care Assistance Level of Assistance  Bathing, Feeding, Dressing Bathing Assistance: Maximum assistance Feeding assistance: Maximum assistance Dressing Assistance: Maximum assistance  Functional Limitations Info  Speech     Speech Info: Impaired (dysarthria)    SPECIAL CARE FACTORS FREQUENCY  PT (By licensed PT), OT (By licensed OT), Speech therapy     PT Frequency: 5x/wk OT Frequency: 5x/wk     Speech Therapy Frequency: 5x/wk      Contractures Contractures  Info: Not present    Additional Factors Info  Code Status, Allergies, Psychotropic, Insulin  Sliding Scale Code Status Info: Full Allergies Info: NKA Psychotropic Info: Seroquel  25mg  daily at bed; Zoloft  50mg  daily Insulin  Sliding Scale Info: see DC summary       Current Medications (08/23/2024):  This is the current hospital active medication list Current Facility-Administered Medications  Medication Dose Route Frequency Provider Last Rate Last Admin   (feeding supplement) PROSource Plus liquid 30 mL  30 mL Oral BID BM Chen, Lydia D, [facility]       acetaminophen  (TYLENOL ) tablet 650 mg  650 mg Oral Q4H PRN Michaela Aisha SQUIBB, MD       Or   acetaminophen  (TYLENOL ) 160 MG/5ML solution 650 mg  650 mg Per Tube Q4H PRN Michaela Aisha SQUIBB, MD   650 mg at 08/19/24 9049   Or   acetaminophen  (TYLENOL ) suppository 650 mg  650 mg Rectal Q4H PRN Michaela Aisha SQUIBB, MD       [START ON 08/24/2024] allopurinol  (ZYLOPRIM ) tablet 50 mg  50 mg Oral Daily Chen, Lydia D, RPH       [START ON 08/24/2024] amLODipine  (NORVASC ) tablet 10 mg  10 mg Oral Daily Chen, Lydia D, [facility]       amLODipine  (NORVASC ) tablet 5 mg  5 mg Oral Once Chen, Lydia D, [facility]       arformoterol  (BROVANA ) nebulizer solution 15 mcg  15 mcg Nebulization BID Smith, Joshua C, NP   15 mcg at 08/23/24 0730   [START ON 08/24/2024] atorvastatin  (LIPITOR) tablet 40 mg  40 mg Oral Daily Chen, Lydia D, [facility]       budesonide  (PULMICORT ) nebulizer solution 0.25 mg  0.25 mg Nebulization BID Smith, Joshua C, NP   0.25 mg at 08/23/24 9268   Chlorhexidine  Gluconate Cloth 2 % PADS 6 each  6 each Topical Q0600 Michaela Aisha SQUIBB, MD   6 each at 08/23/24 0558   feeding supplement (GLUCERNA 1.5 CAL) liquid 1,000 mL  1,000 mL Per Tube Continuous Jerri Pfeiffer, MD   Stopped at 08/22/24 [id number]   feeding supplement (NEPRO CARB STEADY) liquid 237 mL  237 mL Oral BID BM Amin, Sumayya, MD       haloperidol  lactate (HALDOL ) injection 1 mg  1 mg Intravenous Q6H  PRN Amin, Sumayya, MD   1 mg at 08/23/24 0149   heparin  injection 5,000 Units  5,000 Units Subcutaneous Q8H Xu, Jindong, MD   5,000 Units at 08/23/24 9392   hydrALAZINE  (APRESOLINE ) injection 20 mg  20 mg Intravenous Q4H PRN Remi Pippin, NP   20 mg at 08/23/24 9155   insulin  aspart (novoLOG ) injection 0-20 Units  0-20 Units Subcutaneous Q4H Smith, Joshua C, NP   3 Units at 08/23/24 1249   [START ON 08/24/2024] insulin  glargine-yfgn (SEMGLEE ) injection 28 Units  28 Units Subcutaneous Daily Amin, Sumayya, MD       ipratropium-albuterol  (DUONEB) 0.5-2.5 (3) MG/3ML nebulizer solution 3 mL  3 mL Nebulization Q4H PRN Cindy Garnette POUR, MD   3 mL at 08/17/24 0955   labetalol  (NORMODYNE ) injection 40 mg  40 mg Intravenous Q4H PRN Remi Pippin, NP  40 mg at 08/22/24 0042   lactated ringers  infusion   Intravenous Continuous Amin, Sumayya, MD       latanoprost  (XALATAN ) 0.005 % ophthalmic solution 1 drop  1 drop Both Eyes QHS Jerri Pfeiffer, MD   1 drop at 08/22/24 2119   multivitamin with minerals tablet 1 tablet  1 tablet Oral Daily Amin, Sumayya, MD       Oral care mouth rinse  15 mL Mouth Rinse PRN Chand, Sudham, MD       Oral care mouth rinse  15 mL Mouth Rinse 4 times per day Chand, Sudham, MD   15 mL at 08/22/24 2120   Oral care mouth rinse  15 mL Mouth Rinse PRN Chand, Sudham, MD       NOREEN ON 08/24/2024] polyethylene glycol (MIRALAX  / GLYCOLAX ) packet 17 g  17 g Oral Daily Chen, Lydia D, [facility]       QUEtiapine  (SEROQUEL ) tablet 25 mg  25 mg Per Tube QHS Xu, Jindong, MD   25 mg at 08/22/24 2118   senna-docusate (Senokot-S) tablet 1 tablet  1 tablet Oral BID Chen, Lydia D, RPH       [START ON 08/24/2024] sertraline  (ZOLOFT ) tablet 50 mg  50 mg Oral Daily Chen, Lydia D, [facility]       sodium bicarbonate  tablet 1,300 mg  1,300 mg Oral BID Amin, Sumayya, MD         Discharge Medications: Please see discharge summary for a list of discharge medications.  Relevant Imaging Results:  Relevant Lab  Results:   Additional Information SS#: [id number]  [name], LCSW

## 2024-08-23 NOTE — Assessment & Plan Note (Signed)
 CBG currently within goal. - Continue with Semglee  and SSI

## 2024-08-23 NOTE — Assessment & Plan Note (Signed)
 Fluctuating hypernatremia with sodium at 146 today - Continue with hydration

## 2024-08-23 NOTE — Progress Notes (Signed)
 Nutrition Follow-up  DOCUMENTATION CODES:   Obesity unspecified  INTERVENTION:   Pt removed Cortrak 11/23, discontinued tube feeds pending possible replacement  Changed ordering status to not appropriate in [facility], pt should receive automatic trays  Magic cup TID with meals, each supplement provides 290 kcal and 9 grams of protein  Pt currently on DYS 1 w/ honey thick liquids Pt is not appropriate for any ONS except magic cups until thickened to appropriate texture (honey thick)  NUTRITION DIAGNOSIS:   Swallowing difficulty (failed swallow eval) related to acute illness (ICH) as evidenced by NPO status (necessitating enteral nutrition support). Ongoing.   GOAL:   Patient will meet greater than or equal to 90% of their needs Not met as TF discontinued   MONITOR:   TF tolerance, Diet advancement, Labs, Weight trends, I & O's  REASON FOR ASSESSMENT:   Consult Enteral/tube feeding initiation and management  ASSESSMENT:   Patient presented with R-sided weakness and aphasia and was found to have large subcortical hemorrhage. PMH significant for DM2, CHF, HTN, CAD s/p CABG 2019, dyslipidemia, CKD4, COPD, prostate and bladder cancer.  11/11 - admit for L subcortical ICH 11/12 - s/p cortrak placement;  11/18 - pt obtunded and intubated; TF held; Renal consulted for AKI on CKD stage IV started on IV fluids and fluid via Cortrak tube  11/23- pt removed Cortrak 11/24- MBS, DYS 1 w/ honey thick   Pt's wife at bedside during follow up. Wife reports pt's diet advanced following MBS this morning but pt had not received any meals since. Changed ordering status in [facility] to not room service appropriate so pt will receive auto trays and not miss meals. Discussed encouragement at meals by wife to help motivate pt to eat. Pt was asleep at time of assessment and wife reports pt has been very lethargic since MBS and even slept through PT treatment. Discussed with MD that it will be  hard for pt to meet nutrition needs on current diet as no oral supplements are honey thick and are difficult to thicken to that consistency. Added magic cups to all trays to promote increased protein and calorie intake.  If pt continues to be lethargic and not eating well, would consider replacing Cortrak.   Medications: SSI 0-20 units q4h MVI w/ minerals Miralax  Senna Zoloft  Sodium bicarbonate   Labs reviewed:  Na 146/ Cl 114 K 5.1 BUN 68/Cr 3.50 Phos 4.4 A1C 6.0 (08/12/24) CBG's: 84-148 mg/dL  Diet Order:   Diet Order             DIET - DYS 1 Fluid consistency: Honey Thick  Diet effective now                   EDUCATION NEEDS:   Not appropriate for education at this time  Skin:  Skin Assessment: Reviewed RN Assessment  Last BM:  11/24 multiple type 6  Height:   Ht Readings from Last 1 Encounters:  08/17/24 5' 6 (1.676 m)    Weight:   Wt Readings from Last 1 Encounters:  08/23/24 88.6 kg    Ideal Body Weight:  65 kg  BMI:  Body mass index is 31.53 kg/m.  Estimated Nutritional Needs:   Kcal:  1900-2100  Protein:  115-140  Fluid:  >1900 or per Neuro    Josette Glance, MS, RDN, LDN Clinical Dietitian I Please reach out via secure chat

## 2024-08-23 NOTE — Progress Notes (Signed)
 Physical Therapy Treatment Patient Details Name: Alec [name] MRN: [id number] DOB: May 01, 1946 Today's Date: 08/23/2024   History of Present Illness 78 y.o. male adm 08/10/2024 with Rt weakness and aphasia. Pt with large Lt basal ganglia hemorrhage complicated by AKI and aspiration. Intubated 11/18-11/21. PMHx: DM, HTN, CAD s/p CABG, CHF, RA, COPD, CKD, bladder & prostate CA, anxiety, gout, HLD    PT Comments  Pt received in supine and lethargic requiring stimulation for arousal. Pt demonstrates some initiation with bed mobility with increased cues for awareness and sequencing. Pt requires constant min-mod A for sitting balance due to L lateral lean. Attempted standing x2, but pt demonstrates poor initiation and power up. Pt continues to benefit from PT services to progress toward functional mobility goals.    If plan is discharge home, recommend the following: Two people to help with walking and/or transfers;Two people to help with bathing/dressing/bathroom;Assistance with feeding;Direct supervision/assist for medications management;Direct supervision/assist for financial management;Assist for transportation;Help with stairs or ramp for entrance;Supervision due to cognitive status   Can travel by private vehicle     No  Equipment Recommendations  Wheelchair (measurements PT);Wheelchair cushion (measurements PT);Hospital bed;Hoyer lift;BSC/3in1    Recommendations for Other Services       Precautions / Restrictions Precautions Precautions: Fall;Other (comment) Recall of Precautions/Restrictions: Impaired Precaution/Restrictions Comments: SBP<160, cortrak, pushes to Rt, rectal pouch Restrictions Weight Bearing Restrictions Per Provider Order: No     Mobility  Bed Mobility Overal bed mobility: Needs Assistance Bed Mobility: Supine to Sit, Sit to Supine     Supine to sit: Max assist, +2 for physical assistance Sit to supine: Total assist, +2 for physical assistance   General bed  mobility comments: Assist with RLE management and cues for sequencing. Pt able to reach to rail with LUE, but does not maintain support    Transfers Overall transfer level: Needs assistance Equipment used: 2 person hand held assist Transfers: Sit to/from Stand Sit to Stand: Total assist, +2 physical assistance           General transfer comment: STS from EOB x2 with R knee blocked and total A +2 due to lack of initiation    Ambulation/Gait                   Stairs             Wheelchair Mobility     Tilt Bed    Modified Rankin (Stroke Patients Only) Modified Rankin (Stroke Patients Only) Pre-Morbid Rankin Score: No symptoms Modified Rankin: Severe disability     Balance Overall balance assessment: Needs assistance Sitting-balance support: Feet supported, No upper extremity supported Sitting balance-Leahy Scale: Poor Sitting balance - Comments: R lateral lean with slight improvement after L elbow prop   Standing balance support: Bilateral upper extremity supported, During functional activity Standing balance-Leahy Scale: Zero Standing balance comment: total A +2                            Communication Communication Communication: Impaired Factors Affecting Communication: Difficulty expressing self;Reduced clarity of speech  Cognition Arousal: Lethargic Behavior During Therapy: Flat affect   PT - Cognitive impairments: Difficult to assess Difficult to assess due to: Level of arousal                       Following commands: Impaired Following commands impaired: Follows one step commands inconsistently, Follows one step commands with increased time  Cueing Cueing Techniques: Verbal cues, Tactile cues, Gestural cues  Exercises      General Comments        Pertinent Vitals/Pain Pain Assessment Pain Assessment: No/denies pain     PT Goals (current goals can now be found in the care plan section) Acute Rehab PT  Goals Patient Stated Goal: Unable to state PT Goal Formulation: Patient unable to participate in goal setting Time For Goal Achievement: 09/03/24 Progress towards PT goals: Progressing toward goals    Frequency    Min 2X/week       AM-PAC PT 6 Clicks Mobility   Outcome Measure  Help needed turning from your back to your side while in a flat bed without using bedrails?: A Lot Help needed moving from lying on your back to sitting on the side of a flat bed without using bedrails?: Total Help needed moving to and from a bed to a chair (including a wheelchair)?: Total Help needed standing up from a chair using your arms (e.g., wheelchair or bedside chair)?: Total Help needed to walk in hospital room?: Total Help needed climbing 3-5 steps with a railing? : Total 6 Click Score: 7    End of Session Equipment Utilized During Treatment: Gait belt Activity Tolerance: Patient limited by lethargy Patient left: in bed;with call bell/phone within reach;with family/visitor present;with bed alarm set Nurse Communication: Mobility status PT Visit Diagnosis: Hemiplegia and hemiparesis;Unsteadiness on feet (R26.81);Other abnormalities of gait and mobility (R26.89);Muscle weakness (generalized) (M62.81);Difficulty in walking, not elsewhere classified (R26.2);Other symptoms and signs involving the nervous system (R29.898) Hemiplegia - Right/Left: Right Hemiplegia - dominant/non-dominant: Dominant Hemiplegia - caused by: Nontraumatic intracerebral hemorrhage     Time: 1340-1357 PT Time Calculation (min) (ACUTE ONLY): 17 min  Charges:    $Therapeutic Activity: 8-22 mins PT General Charges $$ ACUTE PT VISIT: 1 Visit                    Darryle George, PTA Acute Rehabilitation Services Secure Chat Preferred  [phone]    Darryle George 08/23/2024, 3:11 PM

## 2024-08-23 NOTE — Progress Notes (Addendum)
 Modified Barium Swallow Study  Patient Details  Name: Alec [name] MRN: [id number] Date of Birth: [birth year]  Today's Date: 08/23/2024  Modified Barium Swallow completed.  Full report located under Chart Review in the Imaging Section.  History of Present Illness Alec [name]) is a 78 y.o. male who presented 11/11 with right-sided weakness and aphasia. Head CT with large L hemorrage.   CT 11/12: Interval increase in size of left basal ganglia hemorrhage, now  measuring 3.2 x 3.6 x 2.9 cm (estimated volume 17 mL, previously 11  mL). Mild increase in surrounding edema without significant midline  shift. Pt became obtunded on 11/18 and was intubated and transferred to ICU.  Pt with hx of diabetes, hypertension, CAD, CHF, RA, COPD   Clinical Impression Patient presents with a moderate oropharyngeal dysphagia with improvement from previous study. Oral phase remains delayed due to CN VI and XII deficits with resultant facial and lingual weakness, but functional. Largest contributing factor to aspiration is a delay in swallow initiation, to the pyriform sinuses with consistencies thinner than puree, with resultant deep penetration and/or aspiration of both nectar thick and thin liquids despite varying presentation. Pharyngeal strength largely intact with only occassional trace pyriform sinus residue present post swallow. Current cognitive-linguistic status does not allow for carryout of compensatory strategies beyond controlling bolus size and presentation. Recommend initiation of pureed solids with honey thick liquids via tsp. Fluctuations in mental status will increase aspiration risk and may impact overall intake. SLP will f/u closely. Factors that may increase risk of adverse event in presence of aspiration Alec [name] 2021): Reduced cognitive function;Dependence for feeding and/or oral hygiene  Swallow Evaluation Recommendations Recommendations: PO diet PO Diet Recommendation:  Dysphagia 1 (Pureed);Moderately thick liquids (Level 3, honey thick) Liquid Administration via: Spoon Medication Administration: Crushed with puree Supervision: Staff to assist with self-feeding;Full assist for feeding Swallowing strategies  : Slow rate;Small bites/sips Postural changes: Position pt fully upright for meals Oral care recommendations: Oral care BID (2x/day) Caregiver Recommendations: Avoid jello, ice cream, thin soups, popsicles;Remove water  pitcher;Have oral suction available     Alec Heidecker MA, CCC-SLP  Alec [name] Alec [name] 08/23/2024,12:57 PM

## 2024-08-23 NOTE — Progress Notes (Signed)
 Progress Note   Patient: Alec [name] FMW:[id number] DOB: [birth year] DOA: 08/10/2024     13 DOS: the patient was seen and examined on 08/23/2024   Brief hospital course: 78yo with hx DM, HTN, CAD, CHF, RA, COPD who initially presented on 11/11 with R sided weakness and aphasia, found to have L sided subcortical ICH suspected due to uncontrolled hypertension.  Overnight on 11/19 into 11/18 patient developed severe respiratory distress and was intubated.  Extubated on 11/21 and later transition to room air.  Patient also developed uremic encephalopathy secondary to AKI with underlying CKD stage IV, had hypernatremia, nephrology was consulted and patient received IV fluid which resulted in improvement of hypernatremia and slowly improving renal function. Developed hyperkalemia which has been resolved.  Patient also developed pancytopenia secondary to critical illness which is now improving.  He also received a course of Unasyn  for concern of aspiration pneumonia.  Respiratory cultures growing mixed flora.  Intraparenchymal hemorrhage in the left basal ganglia remained stable, continued midline shift to the right by approximately 5 mm which was stable on repeat CT done on 11/18. Persistent dysphagia-being tube fed. PT is recommending SNF  11/23: Blood pressure remained mildly elevated, pancytopenia improving, hypernatremia resolved, improving renal function with BUN of 80, creatinine 3.50, which is his baseline.  CO2 19.  Patient accidentally pulled out his corTrack.  Repeat swallow evaluation ordered, continuing LR. Able to take meds in applesauce.  11/24: Blood pressure elevated at 162/64-amlodipine  dose was increased to 10 mg daily, intermittent agitation, barium swallow studies were done and he was started on dysphagia 1 diet with honey thick liquid.  Dietitian was also consulted.  Assessment and Plan: * ICH (intracerebral hemorrhage) (HCC) Stable with midline shift, unlikely  secondary to uncontrolled hypertension. -Continue holding antiplatelets. - PT is recommending SNF  Acute respiratory failure with hypoxia (HCC) Patient developed acute hypoxic respiratory failure requiring intubation, extubated on 11/21 and now weaned back to room air. -Continue to monitor -Supplemental oxygen as needed  Hypertension Blood pressure still elevated. Patient was on multiple antihypertensives at home with concern of being noncompliant. He was not on any antihypertensives when transferred out of ICU, likely some concern of hypotension with acute decompensation. -Increasing the dose of amlodipine  to 10 mg daily -As needed hydralazine  and labetalol  -We will add further antihypertensives as needed  Uremic encephalopathy Concern of acute metabolic/uremic encephalopathy which is now improving. - Still having some intermittent agitation requiring restraint  CKD (chronic kidney disease), stage IV (HCC) Patient developed AKI with underlying CKD stage IV during critical illness. Nephrology is on board-not a good candidate for long-term dialysis. - Renal functions now improving and at baseline now. - Monitor renal function -Avoid nephrotoxins  Type 2 diabetes mellitus with chronic kidney disease, with long-term current use of insulin  (HCC) CBG currently within goal. -Continue with Semglee  and SSI  Aspiration pneumonia (HCC) Patient was started on Unasyn  in ICU for concern of aspiration pneumonia. - Continue Unasyn  to complete a 7-day course  COPD (chronic obstructive pulmonary disease) (HCC) No wheezing today. -Continue with bronchodilators  Dysphagia Patient had corTrak in place for feeding which was accidentally removed on 11/23. Able to take medications in applesauce. -Swallow team was reconsulted to reevaluate. -Patient was started on dysphagia 1 diet with honey thick liquid after having barium swallow studies - Dietitian consult to add some supplements to meet  nutritional requirement  Hypernatremia Fluctuating hypernatremia with sodium at 146 today - Continue with hydration  Metabolic acidosis Bicarb of 18,  likely secondary to CKD. - Increasing the dose of p.o. bicarb tablets -Continue to monitor  Hyperlipidemia - Continue with Lipitor      Subjective: Patient was little restless and trying to climb out of bed when seen today.  Wife at bedside.  Unable to explain any symptoms  Physical Exam: Vitals:   08/23/24 0500 08/23/24 0729 08/23/24 0838 08/23/24 1242  BP:  (!) 162/64 (!) 168/58 (!) 151/53  Pulse:  79 85 80  Resp:  18 (!) 22 18  Temp:  98 F (36.7 C)  (!) 97.5 F (36.4 C)  TempSrc:    Oral  SpO2:  98% 100% 98%  Weight: 88.6 kg     Height:       General.  Frail gentleman, appears little agitated, Pulmonary.  Lungs clear bilaterally, normal respiratory effort. CV.  Regular rate and rhythm, no JVD, rub or murmur. Abdomen.  Soft, nontender, nondistended, BS positive. CNS.  Awake but agitated, appears confused Extremities.  No edema,  pulses intact and symmetrical.   Data Reviewed: Prior data reviewed  Family Communication: Discussed with wife at bedside  Disposition: Status is: Inpatient Remains inpatient appropriate because: Severity of illness  Planned Discharge Destination: Skilled nursing facility  DVT prophylaxis.  Subcu heparin  Time spent: 50 minutes  This record has been created using Conservation officer, historic buildings. Errors have been sought and corrected,but may not always be located. Such creation errors do not reflect on the standard of care.   Author: Amaryllis Dare, MD 08/23/2024 2:20 PM  For on call review [email or web address].

## 2024-08-23 NOTE — Assessment & Plan Note (Signed)
 Patient had corTrak in place for feeding which was accidentally removed on 11/23. Able to take medications in applesauce. -Swallow team was reconsulted to reevaluate. -Patient was started on dysphagia 1 diet with honey thick liquid after having barium swallow studies - Dietitian consult to add some supplements to meet nutritional requirement

## 2024-08-23 NOTE — Assessment & Plan Note (Signed)
 Concern of acute metabolic/uremic encephalopathy which is now improving. - Still having some intermittent agitation requiring restraint

## 2024-08-23 NOTE — Assessment & Plan Note (Signed)
 Blood pressure still elevated. Patient was on multiple antihypertensives at home with concern of being noncompliant. He was not on any antihypertensives when transferred out of ICU, likely some concern of hypotension with acute decompensation. -Increasing the dose of amlodipine  to 10 mg daily -Restarting home Imdur  and hydralazine  at 50 mg 3 times daily -As needed  labetalol  - Continue to monitor and add more antihypertensives as needed

## 2024-08-23 NOTE — TOC Progression Note (Signed)
 Transition of Care [facility]) - Progression Note    Patient Details  Name: Alec [name] MRN: [id number] Date of Birth: September 02, 1946  Transition of Care [facility]) CM/SW Contact  Alec [name], Alec [name] Phone Number: 08/23/2024, 3:27 PM  Clinical Narrative:   CSW met with patient's spouse, Alec [name], at bedside to discuss recommendation for SNF. Spouse in agreement, but concerned about insurance coverage. CSW answered questions about Medicare coverage, and spouse appreciative of information. CSW faxed out referral, will follow up with offers.     Expected Discharge Plan: Skilled Nursing Facility Barriers to Discharge: Continued Medical Work up               Expected Discharge Plan and Services                                               Social Drivers of Health (SDOH) Interventions SDOH Screenings   Food Insecurity: Patient Unable To Answer (08/13/2024)  Housing: Patient Unable To Answer (08/13/2024)  Transportation Needs: Patient Unable To Answer (08/13/2024)  Utilities: Patient Unable To Answer (08/13/2024)  Depression (PHQ2-9): Low Risk  (02/27/2023)  Social Connections: Patient Unable To Answer (08/13/2024)  Tobacco Use: Medium Risk (08/10/2024)    Readmission Risk Interventions    03/17/2023    3:00 PM 03/29/2022    2:24 PM  Readmission Risk Prevention Plan  Transportation Screening Complete Complete  PCP or Specialist Appt within 5-7 Days  Complete  PCP or Specialist Appt within 3-5 Days Complete   Home Care Screening  Complete  Medication Review (RN CM)  Complete  HRI or Home Care Consult Complete   Palliative Care Screening Not Applicable   Medication Review (RN Care Manager) Complete

## 2024-08-23 NOTE — Assessment & Plan Note (Signed)
 Stable with midline shift, unlikely secondary to uncontrolled hypertension. -Continue holding antiplatelets. - PT is recommending SNF

## 2024-08-24 LAB — RENAL FUNCTION PANEL
Albumin: 2.6 g/dL — ABNORMAL LOW (ref 3.5–5.0)
Anion gap: 10 (ref 5–15)
BUN: 59 mg/dL — ABNORMAL HIGH (ref 8–23)
CO2: 19 mmol/L — ABNORMAL LOW (ref 22–32)
Calcium: 9 mg/dL (ref 8.9–10.3)
Chloride: 117 mmol/L — ABNORMAL HIGH (ref 98–111)
Creatinine, Ser: 3.45 mg/dL — ABNORMAL HIGH (ref 0.61–1.24)
GFR, Estimated: 18 mL/min — ABNORMAL LOW (ref >60)
Glucose, Bld: 93 mg/dL (ref 70–99)
Phosphorus: 4 mg/dL (ref 2.5–4.6)
Potassium: 4.7 mmol/L (ref 3.5–5.1)
Sodium: 146 mmol/L — ABNORMAL HIGH (ref 135–145)

## 2024-08-24 LAB — GLUCOSE, CAPILLARY
Glucose-Capillary: 95 mg/dL (ref 70–99)
Glucose-Capillary: 94 mg/dL (ref 70–99)
Glucose-Capillary: 209 mg/dL — ABNORMAL HIGH (ref 70–99)
Glucose-Capillary: 130 mg/dL — ABNORMAL HIGH (ref 70–99)
Glucose-Capillary: 181 mg/dL — ABNORMAL HIGH (ref 70–99)

## 2024-08-24 LAB — CBC
HCT: 29.1 % — ABNORMAL LOW (ref 39.0–52.0)
Hemoglobin: 9.8 g/dL — ABNORMAL LOW (ref 13.0–17.0)
MCH: 31 pg (ref 26.0–34.0)
MCHC: 33.7 g/dL (ref 30.0–36.0)
MCV: 92.1 fL (ref 80.0–100.0)
Platelets: 100 K/uL — ABNORMAL LOW (ref 150–400)
RBC: 3.16 MIL/uL — ABNORMAL LOW (ref 4.22–5.81)
RDW: 12.8 % (ref 11.5–15.5)
WBC: 2.9 K/uL — ABNORMAL LOW (ref 4.0–10.5)
nRBC: 0 % (ref 0.0–0.2)

## 2024-08-24 MED ORDER — ISOSORBIDE MONONITRATE ER 60 MG PO TB24
120.0000 mg | ORAL_TABLET | Freq: Every day | ORAL | Status: DC
  Administered 2024-08-24 – 2024-09-04 (×12): 120 mg via ORAL
  Filled 2024-08-24 (×12): qty 2

## 2024-08-24 MED ORDER — SODIUM CHLORIDE 0.45 % IV SOLN: INTRAVENOUS | Status: AC

## 2024-08-24 MED ORDER — HYDRALAZINE HCL 50 MG PO TABS
50.0000 mg | ORAL_TABLET | Freq: Three times a day (TID) | ORAL | Status: DC
  Administered 2024-08-24 – 2024-08-28 (×12): 50 mg via ORAL
  Filled 2024-08-24 (×12): qty 1

## 2024-08-24 NOTE — Progress Notes (Signed)
 Occupational Therapy Treatment Patient Details Name: Alec [name] MRN: [id number] DOB: 22-Dec-1945 Today's Date: 08/24/2024   History of present illness 78 y.o. male adm 08/10/2024 with Rt weakness and aphasia. Pt with large Lt basal ganglia hemorrhage complicated by AKI and aspiration. Intubated 11/18-11/21. PMHx: DM, HTN, CAD s/p CABG, CHF, RA, COPD, CKD, bladder & prostate CA, anxiety, gout, HLD   OT comments  Pt greeted in bed, awake and willing to participate with OT. No family at bedside. Pt able to nod head yes/no to orientation-related questions. Followed 1-step commands inconsistently and with incr time. Focus of today's session on bed level RUE ROM via functional movement patterns and PFN techniques to facilitate neuromuscular response in RUE. Entire R UE remains flaccid. Pt able to assist with rolling with mod A to the L. VSS, assisted with supine scooting for improved positioning. Remains with L head turn pref, R inattention suspected. Goals updated today; OT to continue to follow.      If plan is discharge home, recommend the following:  Two people to help with walking and/or transfers;A lot of help with walking and/or transfers;A lot of help with bathing/dressing/bathroom;Two people to help with bathing/dressing/bathroom;Assistance with cooking/housework;Direct supervision/assist for medications management;Direct supervision/assist for financial management;Assist for transportation;Help with stairs or ramp for entrance   Equipment Recommendations  Other (comment) (defer)    Recommendations for Other Services      Precautions / Restrictions Precautions Precautions: Fall;Other (comment) Recall of Precautions/Restrictions: Impaired Precaution/Restrictions Comments: SBP <160; Cortrak; rectal pouch; pushes to the R Restrictions Weight Bearing Restrictions Per Provider Order: No       Mobility Bed Mobility Overal bed mobility: Needs Assistance Bed Mobility:  Rolling Rolling: Min assist, Mod assist, Used rails         General bed mobility comments: incr difficulty rolling to L side 2/2 RHB paralysis, able to assist OT with supine scooting pushing through LLE; needs up to mod A to sustain L side-lying    Transfers                         Balance                                           ADL either performed or assessed with clinical judgement   ADL                                              Extremity/Trunk Assessment Upper Extremity Assessment Upper Extremity Assessment: RUE deficits/detail RUE Deficits / Details: remains flaccid RUE Sensation: decreased proprioception RUE Coordination: decreased fine motor;decreased gross motor            Vision       Perception Perception Perception: Impaired Preception Impairment Details: Inattention/Neglect Perception-Other Comments: R inattention suspected; con't to monitor and assess further as able (L head turn pref observed)   Praxis     Communication Communication Communication: Impaired Factors Affecting Communication: Difficulty expressing self;Reduced clarity of speech   Cognition Arousal: Alert Behavior During Therapy: WFL for tasks assessed/performed Cognition: Difficult to assess Difficult to assess due to: Impaired communication (exp aphasia & poor vocal/speech quality)           OT - Cognition Comments: pt nodding head  yes/no to orientation-related questions, needed re-oriented to situation but answered appropraitely for [facility] in [place]                 Following commands: Impaired Following commands impaired: Follows one step commands inconsistently, Follows one step commands with increased time      Cueing   Cueing Techniques: Verbal cues, Tactile cues, Gestural cues  Exercises Exercises: General Upper Extremity, Other exercises General Exercises - Upper Extremity Shoulder Flexion: PROM, Right,  10 reps, Supine Shoulder ADduction: PROM, Right, 10 reps, Supine Elbow Flexion: PROM, Right, 10 reps, Supine, Sidelying Elbow Extension: PROM, Right, 10 reps, Supine, Sidelying Other Exercises Other Exercises: PNF technique to facilitate scapular elevation/depression and scapular retraction/protraction x10, tapping to facilitate neuromuscular response with 0/5 movement    Shoulder Instructions       General Comments VSS    Pertinent Vitals/ Pain       Pain Assessment Pain Assessment: Faces Faces Pain Scale: No hurt  Home Living                                          Prior Functioning/Environment              Frequency  Min 2X/week        Progress Toward Goals  OT Goals(current goals can now be found in the care plan section)  Progress towards OT goals: Goals updated  Acute Rehab OT Goals Time For Goal Achievement: 09/07/24 ADL Goals Pt/caregiver will Perform Home Exercise Program: Increased ROM;Increased strength;Right Upper extremity;With minimal assist  Plan      Co-evaluation                 AM-PAC OT 6 Clicks Daily Activity     Outcome Measure   Help from another person eating meals?: A Lot Help from another person taking care of personal grooming?: A Lot Help from another person toileting, which includes using toliet, bedpan, or urinal?: Total Help from another person bathing (including washing, rinsing, drying)?: A Lot Help from another person to put on and taking off regular upper body clothing?: Total Help from another person to put on and taking off regular lower body clothing?: Total 6 Click Score: 9    End of Session    OT Visit Diagnosis: Unsteadiness on feet (R26.81);Other abnormalities of gait and mobility (R26.89);Muscle weakness (generalized) (M62.81);Hemiplegia and hemiparesis Hemiplegia - Right/Left: Right Hemiplegia - dominant/non-dominant: Dominant Hemiplegia - caused by: Other Nontraumatic intracranial  hemorrhage   Activity Tolerance Patient tolerated treatment well   Patient Left in bed;with call bell/phone within reach;with bed alarm set;with restraints reapplied (abdominal belt restraint)   Nurse Communication          Time: 8386-8371 OT Time Calculation (min): 15 min  Charges: OT General Charges $OT Visit: 1 Visit OT Treatments $Neuromuscular Re-education: 8-22 mins  Forever Arechiga M. Burma, OTR/L [facility] Acute Rehabilitation Services [phone] Secure Chat Preferred  Rikki Burma 08/24/2024, 5:13 PM

## 2024-08-24 NOTE — Progress Notes (Signed)
 Speech Language Pathology Treatment: Dysphagia  Patient Details Name: Alec [name] MRN: [id number] DOB: 03/31/1946 Today's Date: 08/24/2024 Time: 8954-8889 SLP Time Calculation (min) (ACUTE ONLY): 25 min  Assessment / Plan / Recommendation Clinical Impression  Patient seen with wife and RN for RN to administer medications -as wife wanted education about patient's MBS results when SLP went to room to provide extra line for suction.   Patient was lethargic at this point and required maximum stimulation to awaken enough to accept p.o. medications. Using teach back and extensive education, instructed need to observe laryngeal elevation to assure patient swallows and to place boluses on the left side of his mouth. Reviewed that patient's pharyngeal swallow reflex is delayed due to his basal ganglia CVA. Patient required extensive time to take his medications due to being lethargic but appeared with adequate airway protection. Option of applying pressure to tongue with dry spoon (or just coated with liquid spoon) to help initiate motor response for swallow reviewed with wife/RN. Patient will be a laborious feeder which will potentially negatively impact his nutrition and hydration.   Encouraged wife to help with him self feeding but she advised that she wanted staff to feed him the first few times. Will recommend consider prevail cup usage for thin liquids to maximize patient's hydration given his level of dysphagia causing restrictive diet. Demonstrated use of oral suction to patient's wife as well will follow-up for dysphagia and dysarthria treatment. Wife reported understanding to information and signs appropriately updated.     HPI HPI: CORDAY [name] Alec) is a 78 y.o. male who presented 11/11 with right-sided weakness and aphasia. Head CT with large L hemorrage. CT 11/12: Interval increase in size of left basal ganglia hemorrhage, now measuring 3.2 x 3.6 x 2.9 cm (estimated volume 17 mL,  previously 11 mL). Mild increase in surrounding edema without significant midline shift. Pt became obtunded on 11/18 and was intubated and transferred to ICU. Pt with hx of diabetes, hypertension, CAD, CHF, RA, COPD.  Patient underwent MBS on 1124 and was advised to have pure and honey thick liquid via teaspoon.      SLP Plan  Continue with current plan of care          Recommendations  Diet recommendations: Dysphagia 1 (puree);Honey-thick liquid Liquids provided via: Teaspoon Medication Administration: Crushed with puree Supervision: Full supervision/cueing for compensatory strategies Compensations: Slow rate;Small sips/bites;Other (Comment) (Observe larynx to assure patient swallows before giving him any more , oral suction when done with meals) Postural Changes and/or Swallow Maneuvers: Seated upright 90 degrees;Upright 30-60 min after meal                  Oral care BID oral suction when finished with meals   Frequent or constant Supervision/Assistance Dysphagia, oropharyngeal phase (R13.12)     Continue with current plan of care    Alec POUR, MS [facility] SLP Acute Rehab Services Office [phone]  Nicolas Emmie Caldron  08/24/2024, 7:45 PM

## 2024-08-24 NOTE — Plan of Care (Signed)
 Problem: Intracerebral Hemorrhage Tissue Perfusion: Goal: Complications of Intracerebral Hemorrhage will be minimized 08/24/2024 0417 by Cleotilde Deanie HERO, RN Outcome: Progressing 08/24/2024 0417 by Cleotilde Deanie HERO, RN Outcome: Progressing   Problem: Coping: Goal: Will verbalize positive feelings about self 08/24/2024 0417 by Cleotilde Deanie HERO, RN Outcome: Progressing 08/24/2024 0417 by Cleotilde Deanie HERO, RN Outcome: Progressing Goal: Will identify appropriate support needs 08/24/2024 0417 by Cleotilde Deanie HERO, RN Outcome: Progressing 08/24/2024 0417 by Cleotilde Deanie HERO, RN Outcome: Progressing   Problem: Health Behavior/Discharge Planning: Goal: Ability to manage health-related needs will improve 08/24/2024 0417 by Cleotilde Deanie HERO, RN Outcome: Progressing 08/24/2024 0417 by Cleotilde Deanie HERO, RN Outcome: Progressing Goal: Goals will be collaboratively established with patient/family 08/24/2024 0417 by Cleotilde Deanie HERO, RN Outcome: Progressing 08/24/2024 0417 by Cleotilde Deanie HERO, RN Outcome: Progressing   Problem: Self-Care: Goal: Ability to participate in self-care as condition permits will improve 08/24/2024 0417 by Cleotilde Deanie HERO, RN Outcome: Progressing 08/24/2024 0417 by Cleotilde Deanie HERO, RN Outcome: Progressing Goal: Verbalization of feelings and concerns over difficulty with self-care will improve 08/24/2024 0417 by Cleotilde Deanie HERO, RN Outcome: Progressing 08/24/2024 0417 by Cleotilde Deanie HERO, RN Outcome: Progressing Goal: Ability to communicate needs accurately will improve 08/24/2024 0417 by Cleotilde Deanie HERO, RN Outcome: Progressing 08/24/2024 0417 by Cleotilde Deanie HERO, RN Outcome: Progressing   Problem: Nutrition: Goal: Risk of aspiration will decrease 08/24/2024 0417 by Cleotilde Deanie HERO, RN Outcome: Progressing 08/24/2024 0417 by Cleotilde Deanie HERO, RN Outcome: Progressing Goal: Dietary intake will improve 08/24/2024 0417 by Cleotilde Deanie HERO,  RN Outcome: Progressing 08/24/2024 0417 by Cleotilde Deanie HERO, RN Outcome: Progressing   Problem: Education: Goal: Ability to describe self-care measures that may prevent or decrease complications (Diabetes Survival Skills Education) will improve 08/24/2024 0417 by Cleotilde Deanie HERO, RN Outcome: Progressing 08/24/2024 0417 by Cleotilde Deanie HERO, RN Outcome: Progressing Goal: Individualized Educational Video(s) 08/24/2024 0417 by Cleotilde Deanie HERO, RN Outcome: Progressing 08/24/2024 0417 by Cleotilde Deanie HERO, RN Outcome: Progressing   Problem: Coping: Goal: Ability to adjust to condition or change in health will improve 08/24/2024 0417 by Cleotilde Deanie HERO, RN Outcome: Progressing 08/24/2024 0417 by Cleotilde Deanie HERO, RN Outcome: Progressing   Problem: Fluid Volume: Goal: Ability to maintain a balanced intake and output will improve 08/24/2024 0417 by Cleotilde Deanie HERO, RN Outcome: Progressing 08/24/2024 0417 by Cleotilde Deanie HERO, RN Outcome: Progressing   Problem: Health Behavior/Discharge Planning: Goal: Ability to identify and utilize available resources and services will improve 08/24/2024 0417 by Cleotilde Deanie HERO, RN Outcome: Progressing 08/24/2024 0417 by Cleotilde Deanie HERO, RN Outcome: Progressing Goal: Ability to manage health-related needs will improve 08/24/2024 0417 by Cleotilde Deanie HERO, RN Outcome: Progressing 08/24/2024 0417 by Cleotilde Deanie HERO, RN Outcome: Progressing   Problem: Metabolic: Goal: Ability to maintain appropriate glucose levels will improve 08/24/2024 0417 by Cleotilde Deanie HERO, RN Outcome: Progressing 08/24/2024 0417 by Cleotilde Deanie HERO, RN Outcome: Progressing   Problem: Nutritional: Goal: Maintenance of adequate nutrition will improve 08/24/2024 0417 by Cleotilde Deanie HERO, RN Outcome: Progressing 08/24/2024 0417 by Cleotilde Deanie HERO, RN Outcome: Progressing Goal: Progress toward achieving an optimal weight will improve 08/24/2024 0417 by Cleotilde Deanie HERO, RN Outcome: Progressing 08/24/2024 0417 by Cleotilde Deanie HERO, RN Outcome: Progressing   Problem: Skin Integrity: Goal: Risk for impaired skin integrity will decrease 08/24/2024 0417 by Cleotilde Deanie HERO, RN Outcome: Progressing 08/24/2024 0417 by Cleotilde Deanie HERO, RN Outcome: Progressing   Problem: Tissue Perfusion: Goal:  Adequacy of tissue perfusion will improve 08/24/2024 0417 by Cleotilde Deanie HERO, RN Outcome: Progressing 08/24/2024 0417 by Cleotilde Deanie HERO, RN Outcome: Progressing   Problem: Education: Goal: Knowledge of General Education information will improve Description: Including pain rating scale, medication(s)/side effects and non-pharmacologic comfort measures 08/24/2024 0417 by Cleotilde Deanie HERO, RN Outcome: Progressing 08/24/2024 0417 by Cleotilde Deanie HERO, RN Outcome: Progressing   Problem: Health Behavior/Discharge Planning: Goal: Ability to manage health-related needs will improve 08/24/2024 0417 by Cleotilde Deanie HERO, RN Outcome: Progressing 08/24/2024 0417 by Cleotilde Deanie HERO, RN Outcome: Progressing   Problem: Clinical Measurements: Goal: Ability to maintain clinical measurements within normal limits will improve 08/24/2024 0417 by Cleotilde Deanie HERO, RN Outcome: Progressing 08/24/2024 0417 by Cleotilde Deanie HERO, RN Outcome: Progressing Goal: Will remain free from infection 08/24/2024 0417 by Cleotilde Deanie HERO, RN Outcome: Progressing 08/24/2024 0417 by Cleotilde Deanie HERO, RN Outcome: Progressing Goal: Diagnostic test results will improve 08/24/2024 0417 by Cleotilde Deanie HERO, RN Outcome: Progressing 08/24/2024 0417 by Cleotilde Deanie HERO, RN Outcome: Progressing Goal: Respiratory complications will improve 08/24/2024 0417 by Cleotilde Deanie HERO, RN Outcome: Progressing 08/24/2024 0417 by Cleotilde Deanie HERO, RN Outcome: Progressing Goal: Cardiovascular complication will be avoided 08/24/2024 0417 by Cleotilde Deanie HERO, RN Outcome: Progressing 08/24/2024  0417 by Cleotilde Deanie HERO, RN Outcome: Progressing   Problem: Activity: Goal: Risk for activity intolerance will decrease 08/24/2024 0417 by Cleotilde Deanie HERO, RN Outcome: Progressing 08/24/2024 0417 by Cleotilde Deanie HERO, RN Outcome: Progressing   Problem: Nutrition: Goal: Adequate nutrition will be maintained 08/24/2024 0417 by Cleotilde Deanie HERO, RN Outcome: Progressing 08/24/2024 0417 by Cleotilde Deanie HERO, RN Outcome: Progressing   Problem: Coping: Goal: Level of anxiety will decrease 08/24/2024 0417 by Cleotilde Deanie HERO, RN Outcome: Progressing 08/24/2024 0417 by Cleotilde Deanie HERO, RN Outcome: Progressing   Problem: Elimination: Goal: Will not experience complications related to bowel motility 08/24/2024 0417 by Cleotilde Deanie HERO, RN Outcome: Progressing 08/24/2024 0417 by Cleotilde Deanie HERO, RN Outcome: Progressing Goal: Will not experience complications related to urinary retention 08/24/2024 0417 by Cleotilde Deanie HERO, RN Outcome: Progressing 08/24/2024 0417 by Cleotilde Deanie HERO, RN Outcome: Progressing   Problem: Pain Managment: Goal: General experience of comfort will improve and/or be controlled 08/24/2024 0417 by Cleotilde Deanie HERO, RN Outcome: Progressing 08/24/2024 0417 by Cleotilde Deanie HERO, RN Outcome: Progressing   Problem: Safety: Goal: Ability to remain free from injury will improve 08/24/2024 0417 by Cleotilde Deanie HERO, RN Outcome: Progressing 08/24/2024 0417 by Cleotilde Deanie HERO, RN Outcome: Progressing   Problem: Skin Integrity: Goal: Risk for impaired skin integrity will decrease 08/24/2024 0417 by Cleotilde Deanie HERO, RN Outcome: Progressing 08/24/2024 0417 by Cleotilde Deanie HERO, RN Outcome: Progressing   Problem: Safety: Goal: Non-violent Restraint(s) 08/24/2024 0417 by Cleotilde Deanie HERO, RN Outcome: Progressing 08/24/2024 0417 by Cleotilde Deanie HERO, RN Outcome: Progressing

## 2024-08-24 NOTE — Progress Notes (Signed)
 Speech Language Pathology Treatment: Dysphagia  Patient Details Name: Alec [name] MRN: [id number] DOB: 01/27/1946 Today's Date: 08/24/2024 Time: 9189-9164 SLP Time Calculation (min) (ACUTE ONLY): 25 min  Assessment / Plan / Recommendation Clinical Impression  Treatment focused on pt's dysphagia goals including PO Intake for nutrition and strategies to mitigate aspiration risk.  Pt pleasant and cooperative this am but very xerostomic.   Reviewed importance of oral care for pulmonary health using teach back with max verbal cues.  Pt is LEFT HANDED and thus able to participate in self feeding and dental brushing more easily.    After oral care, pt was provided with tsps, cup boluses and straw boluses of thin liquids. Impaired labial seal requires placement of straw on his left side and he benefits from total cues for correct placement.  Swallow continues to be delayed - resulting in suspected aspiration of thin water  - c/b overt coughing immediately post-swallow.  Controlling bolus amount to tsps was much better tolerated.  At this time, recommend incorporating frazier water  protocol with water  via tsp to help with hydration -  Will plan to bring a bolus flow cup that pt can use between meals independently to help with hydration as he liquid intake is VERY LIMITED at this time and he lacks adequate manual or oral control to provide himself PO independently.      HPI HPI: Alec [name] Alec [name]) is a 78 y.o. male who presented 11/11 with right-sided weakness and aphasia. Head CT with large L hemorrage.   CT 11/12: Interval increase in size of left basal ganglia hemorrhage, now  measuring 3.2 x 3.6 x 2.9 cm (estimated volume 17 mL, previously 11  mL). Mild increase in surrounding edema without significant midline  shift. Pt became obtunded on 11/18 and was intubated and transferred to ICU.  Pt with hx of diabetes, hypertension, CAD, CHF, RA, COPD      SLP Plan  Continue with current plan of  care          Recommendations  Diet recommendations: Dysphagia 1 (puree);Other(comment);Honey-thick liquid (dys1/Honey via tsp, Thin water  via tsp ok between meals after mouth care) Compensations: Slow rate;Small sips/bites;Monitor for anterior loss Postural Changes and/or Swallow Maneuvers: Out of bed for meals;Seated upright 90 degrees;Upright 30-60 min after meal                  Oral care prior to ice chip/H20;Oral care BID     Dysphagia, oropharyngeal phase (R13.12)     Continue with current plan of care  Alec POUR, MS [facility] SLP Acute Rehab Services Office [phone]    Alec [name]  08/24/2024, 9:34 AM

## 2024-08-24 NOTE — TOC Progression Note (Signed)
 Transition of Care [facility]) - Progression Note    Patient Details  Name: Alec [name] MRN: [id number] Date of Birth: 09/22/1946  [facility] CM/SW Contact  [name], [place] Phone Number: 08/24/2024, 2:18 PM  Clinical Narrative:   CSW met with patient's spouse to provide information on bed offers and answer questions. Spouse worried about patient's feeding, hopeful for improvements with continued SLP. Family to review SNF options. CSW to follow.    Expected Discharge Plan: Skilled Nursing Facility Barriers to Discharge: Continued Medical Work up               Expected Discharge Plan and Services                                               Social Drivers of Health (SDOH) Interventions SDOH Screenings   Food Insecurity: Patient Unable To Answer (08/13/2024)  Housing: Patient Unable To Answer (08/13/2024)  Transportation Needs: Patient Unable To Answer (08/13/2024)  Utilities: Patient Unable To Answer (08/13/2024)  Depression (PHQ2-9): Low Risk  (02/27/2023)  Social Connections: Patient Unable To Answer (08/13/2024)  Tobacco Use: Medium Risk (08/10/2024)    Readmission Risk Interventions    03/17/2023    3:00 PM 03/29/2022    2:24 PM  Readmission Risk Prevention Plan  Transportation Screening Complete Complete  PCP or Specialist Appt within 5-7 Days  Complete  PCP or Specialist Appt within 3-5 Days Complete   Home Care Screening  Complete  Medication Review (RN CM)  Complete  HRI or Home Care Consult Complete   Palliative Care Screening Not Applicable   Medication Review (RN Care Manager) Complete

## 2024-08-24 NOTE — Plan of Care (Signed)
  Problem: Intracerebral Hemorrhage Tissue Perfusion: Goal: Complications of Intracerebral Hemorrhage will be minimized Outcome: Progressing   Problem: Coping: Goal: Will verbalize positive feelings about self Outcome: Progressing Goal: Will identify appropriate support needs Outcome: Progressing   Problem: Health Behavior/Discharge Planning: Goal: Ability to manage health-related needs will improve Outcome: Progressing Goal: Goals will be collaboratively established with patient/family Outcome: Progressing   Problem: Self-Care: Goal: Ability to participate in self-care as condition permits will improve Outcome: Progressing Goal: Verbalization of feelings and concerns over difficulty with self-care will improve Outcome: Progressing Goal: Ability to communicate needs accurately will improve Outcome: Progressing   Problem: Nutrition: Goal: Risk of aspiration will decrease Outcome: Progressing Goal: Dietary intake will improve Outcome: Progressing   Problem: Education: Goal: Ability to describe self-care measures that may prevent or decrease complications (Diabetes Survival Skills Education) will improve Outcome: Progressing Goal: Individualized Educational Video(s) Outcome: Progressing   Problem: Coping: Goal: Ability to adjust to condition or change in health will improve Outcome: Progressing   Problem: Fluid Volume: Goal: Ability to maintain a balanced intake and output will improve Outcome: Progressing   Problem: Health Behavior/Discharge Planning: Goal: Ability to identify and utilize available resources and services will improve Outcome: Progressing Goal: Ability to manage health-related needs will improve Outcome: Progressing   Problem: Metabolic: Goal: Ability to maintain appropriate glucose levels will improve Outcome: Progressing   Problem: Nutritional: Goal: Maintenance of adequate nutrition will improve Outcome: Progressing Goal: Progress toward  achieving an optimal weight will improve Outcome: Progressing   Problem: Skin Integrity: Goal: Risk for impaired skin integrity will decrease Outcome: Progressing   Problem: Tissue Perfusion: Goal: Adequacy of tissue perfusion will improve Outcome: Progressing   Problem: Education: Goal: Knowledge of General Education information will improve Description: Including pain rating scale, medication(s)/side effects and non-pharmacologic comfort measures Outcome: Progressing   Problem: Health Behavior/Discharge Planning: Goal: Ability to manage health-related needs will improve Outcome: Progressing   Problem: Clinical Measurements: Goal: Ability to maintain clinical measurements within normal limits will improve Outcome: Progressing Goal: Will remain free from infection Outcome: Progressing Goal: Diagnostic test results will improve Outcome: Progressing Goal: Respiratory complications will improve Outcome: Progressing Goal: Cardiovascular complication will be avoided Outcome: Progressing   Problem: Activity: Goal: Risk for activity intolerance will decrease Outcome: Progressing   Problem: Nutrition: Goal: Adequate nutrition will be maintained Outcome: Progressing   Problem: Coping: Goal: Level of anxiety will decrease Outcome: Progressing   Problem: Elimination: Goal: Will not experience complications related to bowel motility Outcome: Progressing Goal: Will not experience complications related to urinary retention Outcome: Progressing   Problem: Pain Managment: Goal: General experience of comfort will improve and/or be controlled Outcome: Progressing   Problem: Safety: Goal: Ability to remain free from injury will improve Outcome: Progressing   Problem: Skin Integrity: Goal: Risk for impaired skin integrity will decrease Outcome: Progressing   Problem: Safety: Goal: Non-violent Restraint(s) Outcome: Progressing

## 2024-08-24 NOTE — Progress Notes (Signed)
 Progress Note   Patient: Alec [name] FMW:[id number] DOB: 1946/07/07 DOA: 08/10/2024     14 DOS: the patient was seen and examined on 08/24/2024   Brief hospital course: 78yo with hx DM, HTN, CAD, CHF, RA, COPD who initially presented on 11/11 with R sided weakness and aphasia, found to have L sided subcortical ICH suspected due to uncontrolled hypertension.  Overnight on 11/19 into 11/18 patient developed severe respiratory distress and was intubated.  Extubated on 11/21 and later transition to room air.  Patient also developed uremic encephalopathy secondary to AKI with underlying CKD stage IV, had hypernatremia, nephrology was consulted and patient received IV fluid which resulted in improvement of hypernatremia and slowly improving renal function. Developed hyperkalemia which has been resolved.  Patient also developed pancytopenia secondary to critical illness which is now improving.  He also received a course of Unasyn  for concern of aspiration pneumonia.  Respiratory cultures growing mixed flora.  Intraparenchymal hemorrhage in the left basal ganglia remained stable, continued midline shift to the right by approximately 5 mm which was stable on repeat CT done on 11/18. Persistent dysphagia-being tube fed. PT is recommending SNF  11/23: Blood pressure remained mildly elevated, pancytopenia improving, hypernatremia resolved, improving renal function with BUN of 80, creatinine 3.50, which is his baseline.  CO2 19.  Patient accidentally pulled out his corTrack.  Repeat swallow evaluation ordered, continuing LR. Able to take meds in applesauce.  11/24: Blood pressure elevated at 162/64-amlodipine  dose was increased to 10 mg daily, intermittent agitation, barium swallow studies were done and he was started on dysphagia 1 diet with honey thick liquid.  Dietitian was also consulted.  11/25: Blood pressure was elevated in the morning restarted home Imdur  and hydralazine  at 50 mg 3 times  daily, tolerating dysphagia 1 with honey thickened diet.  Assessment and Plan: * ICH (intracerebral hemorrhage) (HCC) Stable with midline shift, unlikely secondary to uncontrolled hypertension. -Continue holding antiplatelets. - PT is recommending SNF  Acute respiratory failure with hypoxia (HCC) Patient developed acute hypoxic respiratory failure requiring intubation, extubated on 11/21 and now weaned back to room air. -Continue to monitor -Supplemental oxygen as needed  Hypertension Blood pressure still elevated. Patient was on multiple antihypertensives at home with concern of being noncompliant. He was not on any antihypertensives when transferred out of ICU, likely some concern of hypotension with acute decompensation. -Increasing the dose of amlodipine  to 10 mg daily -Restarting home Imdur  and hydralazine  at 50 mg 3 times daily -As needed  labetalol  - Continue to monitor and add more antihypertensives as needed  Uremic encephalopathy Concern of acute metabolic/uremic encephalopathy which is now improving. - Still having some intermittent agitation requiring restraint  CKD (chronic kidney disease), stage IV (HCC) Patient developed AKI with underlying CKD stage IV during critical illness. Nephrology is on board-not a good candidate for long-term dialysis. - Renal functions now improving and at baseline now. - Monitor renal function -Avoid nephrotoxins  Type 2 diabetes mellitus with chronic kidney disease, with long-term current use of insulin  (HCC) CBG currently within goal. -Continue with Semglee  and SSI  Aspiration pneumonia (HCC) Patient was started on Unasyn  in ICU for concern of aspiration pneumonia. - Continue Unasyn  to complete a 7-day course  COPD (chronic obstructive pulmonary disease) (HCC) No wheezing today. -Continue with bronchodilators  Dysphagia Patient had corTrak in place for feeding which was accidentally removed on 11/23. Able to take medications  in applesauce. -Swallow team was reconsulted to reevaluate. -Patient was started on dysphagia 1  diet with honey thick liquid after having barium swallow studies - Dietitian consult to add some supplements to meet nutritional requirement  Hypernatremia Fluctuating hypernatremia with sodium at 146 today - Continue with hydration  Metabolic acidosis Bicarb of 18, likely secondary to CKD. - Increasing the dose of p.o. bicarb tablets -Continue to monitor  Hyperlipidemia - Continue with Lipitor      Subjective: Patient was resting comfortably when seen today.  Per wife he was awake earlier and worked with swallow team and ate his breakfast.  That much exertion rarely wears him off and requesting to let him sleep.  Physical Exam: Vitals:   08/24/24 0318 08/24/24 0500 08/24/24 0700 08/24/24 1146  BP: (!) 187/42  (!) 162/67 129/64  Pulse: 75  79 73  Resp:   19 19  Temp: 97.7 F (36.5 C)  98.3 F (36.8 C) 97.9 F (36.6 C)  TempSrc: Oral  Axillary Oral  SpO2: 100%  100% 98%  Weight:  90.5 kg    Height:       General.  Frail elderly man, in no acute distress. Pulmonary.  Lungs clear bilaterally, normal respiratory effort. CV.  Regular rate and rhythm, no JVD, rub or murmur. Abdomen.  Soft, nontender, nondistended, BS positive. CNS.  Sleeping.  No new focal neurologic deficit. Extremities.  No edema,  pulses intact and symmetrical.   Data Reviewed: Prior data reviewed  Family Communication: Discussed with wife at bedside  Disposition: Status is: Inpatient Remains inpatient appropriate because: Severity of illness  Planned Discharge Destination: Skilled nursing facility  DVT prophylaxis.  Subcu heparin  Time spent: 50 minutes  This record has been created using Conservation officer, historic buildings. Errors have been sought and corrected,but may not always be located. Such creation errors do not reflect on the standard of care.   Author: Amaryllis Dare, MD 08/24/2024 2:21  PM  For on call review [email or web address].

## 2024-08-24 NOTE — Progress Notes (Signed)
 Speech Language Pathology Treatment: Dysphagia;Cognitive-Linguistic  Patient Details Name: Alec [name] MRN: [id number] DOB: July 13, 1946 Today's Date: 08/24/2024 Time: 9159-9084 SLP Time Calculation (min) (ACUTE ONLY): 35 min  Assessment / Plan / Recommendation Clinical Impression  Patient seen for second session to focus on intake of his meal and his cognitive linguistic goals in functional environment of eating meal to help select food items.    Using teach back, patient able to verbalize risk of getting PNA if he is aspirating to promote understanding of importance of his swallow precautions including eating slowly and swallowing prior to taking more PO.  Pt assisted with his meal with focus on placing food on his stronger LEFT side and swallowing prior to taking more.  He is a laborious feeder due to his dysphagia causing a delay in his swallow- and he is impulsive- and thus absolute full assist required.  Reflexive cough noted x3 during his meal - mostly observed with milk based products - presuming they had melted down and were aspirated.  Thus SLP used extra caution with these items - and preferred Magic Cup when it was still frozen to improve sensory input. Slightly increased RR noted toward end of meal - with ? Slight wheeze, which pt admits is baseline for him *has COPD.      Pt consumed approx 60% of his meal over the entire session with max cues to proper placement of food and mod cues to oral suction following meal.    He articulated Magic Cup with max verbal cues to repeat when he selected this as his favorite food item. He remains severely dysarthric and aphasic and this often requires listener to provide him verbal and visual options to understand him (I.e. grits, eggs, magic cup).    He needed encouragement however to speak - thus will be important to encourage him.  Continued aggressive SLP for dysphagia and cognitive linguistic treatment required.   Posted new signs with  pt using teach back for clinical reasoning via yes/no and right/left.      HPI HPI: Alec [name]) is a 78 y.o. male who presented 11/11 with right-sided weakness and aphasia. Head CT with large L hemorrage.   CT 11/12: Interval increase in size of left basal ganglia hemorrhage, now  measuring 3.2 x 3.6 x 2.9 cm (estimated volume 17 mL, previously 11  mL). Mild increase in surrounding edema without significant midline  shift. Pt became obtunded on 11/18 and was intubated and transferred to ICU.  Pt with hx of diabetes, hypertension, CAD, CHF, RA, COPD      SLP Plan  Continue with current plan of care          Recommendations  Diet recommendations: Dysphagia 1 (puree);Honey-thick liquid Liquids provided via: Teaspoon Medication Administration: Crushed with puree (may try whole if small and place on his left side) Compensations: Slow rate;Small sips/bites;Other (Comment) (delayed swallow, observe pt swallow) Postural Changes and/or Swallow Maneuvers: Out of bed for meals;Seated upright 90 degrees;Upright 30-60 min after meal                  Oral care prior to ice chip/H20;Oral care BID   Frequent or constant Supervision/Assistance Dysphagia, oropharyngeal phase (R13.12)     Continue with current plan of care   Madelin POUR, MS [facility] SLP Acute Rehab Services Office [phone]   Nicolas Emmie Caldron  08/24/2024, 9:48 AM

## 2024-08-25 LAB — GLUCOSE, CAPILLARY
Glucose-Capillary: 119 mg/dL — ABNORMAL HIGH (ref 70–99)
Glucose-Capillary: 132 mg/dL — ABNORMAL HIGH (ref 70–99)
Glucose-Capillary: 210 mg/dL — ABNORMAL HIGH (ref 70–99)
Glucose-Capillary: 213 mg/dL — ABNORMAL HIGH (ref 70–99)
Glucose-Capillary: 83 mg/dL (ref 70–99)

## 2024-08-25 LAB — RENAL FUNCTION PANEL
Albumin: 2.6 g/dL — ABNORMAL LOW (ref 3.5–5.0)
Anion gap: 9 (ref 5–15)
BUN: 56 mg/dL — ABNORMAL HIGH (ref 8–23)
CO2: 17 mmol/L — ABNORMAL LOW (ref 22–32)
Calcium: 8.6 mg/dL — ABNORMAL LOW (ref 8.9–10.3)
Chloride: 118 mmol/L — ABNORMAL HIGH (ref 98–111)
Creatinine, Ser: 3.55 mg/dL — ABNORMAL HIGH (ref 0.61–1.24)
GFR, Estimated: 17 mL/min — ABNORMAL LOW (ref >60)
Glucose, Bld: 131 mg/dL — ABNORMAL HIGH (ref 70–99)
Phosphorus: 3.9 mg/dL (ref 2.5–4.6)
Potassium: 4.9 mmol/L (ref 3.5–5.1)
Sodium: 144 mmol/L (ref 135–145)

## 2024-08-25 LAB — CBC
HCT: 28.1 % — ABNORMAL LOW (ref 39.0–52.0)
Hemoglobin: 9 g/dL — ABNORMAL LOW (ref 13.0–17.0)
MCH: 30.6 pg (ref 26.0–34.0)
MCHC: 32 g/dL (ref 30.0–36.0)
MCV: 95.6 fL (ref 80.0–100.0)
Platelets: 97 K/uL — ABNORMAL LOW (ref 150–400)
RBC: 2.94 MIL/uL — ABNORMAL LOW (ref 4.22–5.81)
RDW: 12.8 % (ref 11.5–15.5)
WBC: 2.5 K/uL — ABNORMAL LOW (ref 4.0–10.5)
nRBC: 0 % (ref 0.0–0.2)

## 2024-08-25 NOTE — Progress Notes (Signed)
 PROGRESS NOTE    Alec [name]  FMW:[id number] DOB: 1946/08/25 DOA: 08/10/2024 PCP: Loreli Kins, MD  Subjective:  No acute events overnight. Seen and examined at bedside. Unable to provide much history due to altered mentation following CVA. As per family, tolerating oral intake without n/v. Having regular BMs   Hospital Course: 78yo with hx DM, HTN, CAD, CHF, RA, COPD who initially presented on 11/11 with R sided weakness and aphasia, found to have L sided subcortical ICH suspected due to uncontrolled hypertension.  Overnight on 11/19 into 11/18 patient developed severe respiratory distress and was intubated.  Extubated on 11/21 and later transition to room air.  Patient also developed uremic encephalopathy secondary to AKI with underlying CKD stage IV, had hypernatremia, nephrology was consulted and patient received IV fluid which resulted in improvement of hypernatremia and slowly improving renal function. Developed hyperkalemia which has been resolved.  Patient also developed pancytopenia secondary to critical illness which is now improving.  He also received a course of Unasyn  for concern of aspiration pneumonia.  Respiratory cultures growing mixed flora.  Intraparenchymal hemorrhage in the left basal ganglia remained stable, continued midline shift to the right by approximately 5 mm which was stable on repeat CT done on 11/18. Persistent dysphagia-being tube fed. PT is recommending SNF  11/23: Blood pressure remained mildly elevated, pancytopenia improving, hypernatremia resolved, improving renal function with BUN of 80, creatinine 3.50, which is his baseline.  CO2 19.  Patient accidentally pulled out his corTrack.  Repeat swallow evaluation ordered, continuing LR. Able to take meds in applesauce.  11/24: Blood pressure elevated at 162/64-amlodipine  dose was increased to 10 mg daily, intermittent agitation, barium swallow studies were done and he was started on dysphagia 1  diet with honey thick liquid.  Dietitian was also consulted.  11/25: Blood pressure was elevated in the morning restarted home Imdur  and hydralazine  at 50 mg 3 times daily, tolerating dysphagia 1 with honey thickened diet.   Assessment and Plan: * ICH (intracerebral hemorrhage) (HCC) Stable with midline shift, unlikely secondary to uncontrolled hypertension. -Continue holding antiplatelets. - cont statin - PT recommend SNF   Acute respiratory failure with hypoxia (HCC) Patient developed acute hypoxic respiratory failure requiring intubation, extubated on 11/21 and now weaned back to room air. -Continue to monitor -Supplemental oxygen as needed   Hypertension Blood pressure better controlled Patient was on multiple antihypertensives at home with concern of being noncompliant. He was not on any antihypertensives when transferred out of ICU, likely some concern of hypotension with acute decompensation. -cont amlodipine  10 mg daily -cont home Imdur  and hydralazine  at 50 mg 3 times daily -As needed  labetalol  - Continue to monitor and add more antihypertensives as needed   Uremic encephalopathy Concern of acute metabolic/uremic encephalopathy which is now improving. - Still having some intermittent agitation  - monitor clinically   CKD (chronic kidney disease), stage IV (HCC) Patient developed AKI with underlying CKD stage IV during critical illness. Nephrology is on board-not a good candidate for long-term dialysis. - Renal functions now improving and at baseline now. - Monitor renal function -Avoid nephrotoxins   Type 2 diabetes mellitus with chronic kidney disease, with long-term current use of insulin  (HCC) CBG currently within goal. -Continue with Semglee  and SSI   Aspiration pneumonia (HCC) Patient was started on Unasyn  in ICU for concern of aspiration pneumonia. - finished Unasyn  7-day course on 11/24   COPD (chronic obstructive pulmonary disease) (HCC) No wheezing  today. -Continue with bronchodilators  Dysphagia Patient had corTrak in place for feeding which was accidentally removed on 11/23. Able to take medications in applesauce. -Swallow team was reconsulted to reevaluate. -Patient was started on dysphagia 1 diet with honey thick liquid after having barium swallow studies - Dietitian following  Hypernatremia - status post IV hydration - Continue with oral hydration   Metabolic acidosis likely secondary to CKD. - Titrate p.o. bicarb tablets as needed -Continue to monitor   Hyperlipidemia - Continue with Lipitor  DVT prophylaxis: heparin  injection 5,000 Units Start: 08/12/24 1400 SCD's Start: 08/10/24 1915  SQ Heparin    Code Status: Full Code Family Communication: Spoke with wife and extended family at beside. Disposition Plan: SNF Reason for continuing need for hospitalization: monitor for oral intake  Objective: Vitals:   08/25/24 0513 08/25/24 0724 08/25/24 0750 08/25/24 1540  BP: (!) 161/71  (!) 140/80 139/66  Pulse:  76 74 70  Resp:  18  16  Temp:   98.1 F (36.7 C) 97.8 F (36.6 C)  TempSrc:   Oral Oral  SpO2:   97% 97%  Weight:      Height:        Intake/Output Summary (Last 24 hours) at 08/25/2024 1757 Last data filed at 08/25/2024 0523 Gross per 24 hour  Intake 1921.46 ml  Output 1400 ml  Net 521.46 ml   Filed Weights   08/23/24 0500 08/24/24 0500 08/25/24 0500  Weight: 88.6 kg 90.5 kg 90.8 kg    Examination:  Physical Exam  Data Reviewed: I have personally reviewed following labs and imaging studies  CBC: Recent Labs  Lab 08/21/24 0442 08/22/24 0414 08/23/24 0147 08/24/24 0208 08/25/24 0105  WBC 1.9* 3.7* 3.2* 2.9* 2.5*  HGB 9.0* 9.5* 10.7* 9.8* 9.0*  HCT 27.1* 28.3* 31.7* 29.1* 28.1*  MCV 95.4 93.4 93.5 92.1 95.6  PLT 83* 90* 103* 100* 97*   Basic Metabolic Panel: Recent Labs  Lab 08/19/24 0235 08/20/24 0331 08/21/24 0442 08/22/24 0414 08/23/24 0147 08/24/24 0208 08/25/24 0105   NA 144 144 147* 143 146* 146* 144  K 4.6 4.9 4.9 4.9 5.1 4.7 4.9  CL 112* 113* 116* 114* 114* 117* 118*  CO2 18* 21* 20* 19* 18* 19* 17*  GLUCOSE 189* 175* 192* 160* 88 93 131*  BUN 141* 117* 98* 80* 68* 59* 56*  CREATININE 4.82* 4.55* 4.02* 3.50* 3.50* 3.45* 3.55*  CALCIUM  8.1* 8.4* 8.5* 8.5* 9.0 9.0 8.6*  MG 2.9* 2.9* 2.8* 2.3 2.2  --   --   PHOS 4.4 5.1* 4.7* 3.6 3.9 4.0 3.9   GFR: Estimated Creatinine Clearance: 18.7 mL/min (A) (by C-G formula based on SCr of 3.55 mg/dL (H)). Liver Function Tests: Recent Labs  Lab 08/24/24 0208 08/25/24 0105  ALBUMIN  2.6* 2.6*   No results for input(s): LIPASE, AMYLASE in the last 168 hours. No results for input(s): AMMONIA in the last 168 hours. Coagulation Profile: No results for input(s): INR, PROTIME in the last 168 hours. Cardiac Enzymes: No results for input(s): CKTOTAL, CKMB, CKMBINDEX, TROPONINI in the last 168 hours. ProBNP, BNP (last 5 results) No results for input(s): PROBNP, BNP in the last 8760 hours. HbA1C: No results for input(s): HGBA1C in the last 72 hours. CBG: Recent Labs  Lab 08/24/24 2036 08/25/24 0053 08/25/24 0346 08/25/24 0749 08/25/24 1539  GLUCAP 181* 132* 83 119* 210*   Lipid Profile: No results for input(s): CHOL, HDL, LDLCALC, TRIG, CHOLHDL, LDLDIRECT in the last 72 hours. Thyroid  Function Tests: No results for input(s): TSH, T4TOTAL,  FREET4, T3FREE, THYROIDAB in the last 72 hours. Anemia Panel: No results for input(s): VITAMINB12, FOLATE, FERRITIN, TIBC, IRON, RETICCTPCT in the last 72 hours. Sepsis Labs: No results for input(s): PROCALCITON, LATICACIDVEN in the last 168 hours.  Recent Results (from the past 240 hours)  Culture, Respiratory w Gram Stain     Status: None   Collection Time: 08/17/24 11:19 AM   Specimen: Tracheal Aspirate; Respiratory  Result Value Ref Range Status   Specimen Description TRACHEAL ASPIRATE  Final    Special Requests NONE  Final   Gram Stain   Final    MODERATE SQUAMOUS EPITHELIAL CELLS PRESENT FEW WBC PRESENT, PREDOMINANTLY PMN MODERATE GRAM POSITIVE COCCI MODERATE GRAM POSITIVE RODS MODERATE GRAM NEGATIVE RODS    Culture   Final    FEW Normal respiratory flora-no Staph aureus or Pseudomonas seen Performed at [facility], [address], [place], [place] [address]    Report Status 08/19/2024 FINAL  Final     Radiology Studies: No results found.  Scheduled Meds:  allopurinol   50 mg Oral Daily   amLODipine   10 mg Oral Daily   arformoterol   15 mcg Nebulization BID   atorvastatin   40 mg Oral Daily   budesonide  (PULMICORT ) nebulizer solution  0.25 mg Nebulization BID   Chlorhexidine  Gluconate Cloth  6 each Topical Q0600   heparin  injection (subcutaneous)  5,000 Units Subcutaneous Q8H   hydrALAZINE   50 mg Oral Q8H   insulin  aspart  0-20 Units Subcutaneous Q4H   insulin  glargine-yfgn  28 Units Subcutaneous Daily   isosorbide  mononitrate  120 mg Oral Daily   latanoprost   1 drop Both Eyes QHS   multivitamin with minerals  1 tablet Oral Daily   mouth rinse  15 mL Mouth Rinse 4 times per day   polyethylene glycol  17 g Oral Daily   QUEtiapine   25 mg Oral QHS   senna-docusate  1 tablet Oral BID   sertraline   50 mg Oral Daily   sodium bicarbonate   1,300 mg Oral BID   Continuous Infusions:   LOS: 15 days   Norval Bar, MD  [facility]  08/25/2024, 5:57 PM

## 2024-08-25 NOTE — Plan of Care (Signed)
  Problem: Intracerebral Hemorrhage Tissue Perfusion: Goal: Complications of Intracerebral Hemorrhage will be minimized Outcome: Progressing   Problem: Coping: Goal: Will verbalize positive feelings about self Outcome: Progressing Goal: Will identify appropriate support needs Outcome: Progressing   Problem: Health Behavior/Discharge Planning: Goal: Ability to manage health-related needs will improve Outcome: Progressing Goal: Goals will be collaboratively established with patient/family Outcome: Progressing   Problem: Self-Care: Goal: Ability to participate in self-care as condition permits will improve Outcome: Progressing Goal: Verbalization of feelings and concerns over difficulty with self-care will improve Outcome: Progressing Goal: Ability to communicate needs accurately will improve Outcome: Progressing   Problem: Nutrition: Goal: Risk of aspiration will decrease Outcome: Progressing Goal: Dietary intake will improve Outcome: Progressing   Problem: Education: Goal: Ability to describe self-care measures that may prevent or decrease complications (Diabetes Survival Skills Education) will improve Outcome: Progressing Goal: Individualized Educational Video(s) Outcome: Progressing   Problem: Coping: Goal: Ability to adjust to condition or change in health will improve Outcome: Progressing   Problem: Fluid Volume: Goal: Ability to maintain a balanced intake and output will improve Outcome: Progressing   Problem: Health Behavior/Discharge Planning: Goal: Ability to identify and utilize available resources and services will improve Outcome: Progressing Goal: Ability to manage health-related needs will improve Outcome: Progressing   Problem: Metabolic: Goal: Ability to maintain appropriate glucose levels will improve Outcome: Progressing   Problem: Nutritional: Goal: Maintenance of adequate nutrition will improve Outcome: Progressing Goal: Progress toward  achieving an optimal weight will improve Outcome: Progressing   Problem: Skin Integrity: Goal: Risk for impaired skin integrity will decrease Outcome: Progressing   Problem: Tissue Perfusion: Goal: Adequacy of tissue perfusion will improve Outcome: Progressing   Problem: Education: Goal: Knowledge of General Education information will improve Description: Including pain rating scale, medication(s)/side effects and non-pharmacologic comfort measures Outcome: Progressing   Problem: Health Behavior/Discharge Planning: Goal: Ability to manage health-related needs will improve Outcome: Progressing   Problem: Clinical Measurements: Goal: Ability to maintain clinical measurements within normal limits will improve Outcome: Progressing Goal: Will remain free from infection Outcome: Progressing Goal: Diagnostic test results will improve Outcome: Progressing Goal: Respiratory complications will improve Outcome: Progressing Goal: Cardiovascular complication will be avoided Outcome: Progressing   Problem: Activity: Goal: Risk for activity intolerance will decrease Outcome: Progressing   Problem: Nutrition: Goal: Adequate nutrition will be maintained Outcome: Progressing   Problem: Coping: Goal: Level of anxiety will decrease Outcome: Progressing   Problem: Elimination: Goal: Will not experience complications related to bowel motility Outcome: Progressing Goal: Will not experience complications related to urinary retention Outcome: Progressing   Problem: Pain Managment: Goal: General experience of comfort will improve and/or be controlled Outcome: Progressing   Problem: Safety: Goal: Ability to remain free from injury will improve Outcome: Progressing   Problem: Skin Integrity: Goal: Risk for impaired skin integrity will decrease Outcome: Progressing

## 2024-08-25 NOTE — TOC Progression Note (Signed)
 Transition of Care [facility]) - Progression Note    Patient Details  Name: Alec [name] MRN: [id number] Date of Birth: 03/13/1946  Transition of [facility]) CM/SW Contact  [name], [place] Phone Number: 08/25/2024, 1:48 PM  Clinical Narrative:   CSW met with spouse to discuss SNF placement. Family reviewing options, encouraged that patient is more alert this morning. CSW to follow.    Expected Discharge Plan: Skilled Nursing Facility Barriers to Discharge: Continued Medical Work up               Expected Discharge Plan and Services                                               Social Drivers of Health (SDOH) Interventions SDOH Screenings   Food Insecurity: Patient Unable To Answer (08/13/2024)  Housing: Patient Unable To Answer (08/13/2024)  Transportation Needs: Patient Unable To Answer (08/13/2024)  Utilities: Patient Unable To Answer (08/13/2024)  Depression (PHQ2-9): Low Risk  (02/27/2023)  Social Connections: Patient Unable To Answer (08/13/2024)  Tobacco Use: Medium Risk (08/10/2024)    Readmission Risk Interventions    03/17/2023    3:00 PM 03/29/2022    2:24 PM  Readmission Risk Prevention Plan  Transportation Screening Complete Complete  PCP or Specialist Appt within 5-7 Days  Complete  PCP or Specialist Appt within 3-5 Days Complete   Home Care Screening  Complete  Medication Review (RN CM)  Complete  HRI or Home Care Consult Complete   Palliative Care Screening Not Applicable   Medication Review (RN Care Manager) Complete

## 2024-08-25 NOTE — Progress Notes (Signed)
 Physical Therapy Treatment Patient Details Name: Alec [name] MRN: [id number] DOB: 05/12/1946 Today's Date: 08/25/2024   History of Present Illness 78 y.o. male adm 08/10/2024 with Rt weakness and aphasia. Pt with large Lt basal ganglia hemorrhage complicated by AKI and aspiration. Intubated 11/18-11/21. PMHx: DM, HTN, CAD s/p CABG, CHF, RA, COPD, CKD, bladder & prostate CA, anxiety, gout, HLD    PT Comments  Pt received in supine and agreeable to session. Pt demonstrates improved alertness and command following this session. Pt demonstrates some RLE activation with heel slide and advancement to EOB this session. Pt continues to demonstrate poor sitting balance with pushing towards the R, however improves briefly after L elbow props. Pt demonstrates increased R lateral lean with functional use of LUE and increased fatigue. Pt able to briefly lean to L during dynamic reaching task with max cues. Pt continues to benefit from PT services to progress toward functional mobility goals.     If plan is discharge home, recommend the following: Two people to help with walking and/or transfers;Two people to help with bathing/dressing/bathroom;Assistance with feeding;Direct supervision/assist for medications management;Direct supervision/assist for financial management;Assist for transportation;Help with stairs or ramp for entrance;Supervision due to cognitive status   Can travel by private vehicle     No  Equipment Recommendations  Wheelchair (measurements PT);Wheelchair cushion (measurements PT);Hospital bed;Hoyer lift;BSC/3in1    Recommendations for Other Services       Precautions / Restrictions Precautions Precautions: Fall;Other (comment) Recall of Precautions/Restrictions: Impaired Precaution/Restrictions Comments: SBP <160; pushes to the R Restrictions Weight Bearing Restrictions Per Provider Order: No     Mobility  Bed Mobility Overal bed mobility: Needs Assistance Bed Mobility:  Supine to Sit, Sit to Supine     Supine to sit: Max assist, +2 for physical assistance Sit to supine: Max assist, +2 for physical assistance   General bed mobility comments: Assist for all aspects, but pt demonstrates improved use of bedrail with LUE and initiation with cues. Cues for step by step sequencing throughout    Transfers                   General transfer comment: deferred due to poor sitting balance    Ambulation/Gait                   Stairs             Wheelchair Mobility     Tilt Bed    Modified Rankin (Stroke Patients Only) Modified Rankin (Stroke Patients Only) Pre-Morbid Rankin Score: No symptoms Modified Rankin: Severe disability     Balance Overall balance assessment: Needs assistance Sitting-balance support: Feet supported, No upper extremity supported Sitting balance-Leahy Scale: Zero Sitting balance - Comments: R lateral lean requiring consistent min-max A to maintain balance. Improved midline after L elbow props, but pt unable to maintain                                    Communication Communication Communication: Impaired Factors Affecting Communication: Difficulty expressing self;Reduced clarity of speech  Cognition Arousal: Alert Behavior During Therapy: WFL for tasks assessed/performed   PT - Cognitive impairments: Awareness, Safety/Judgement, Problem solving, Initiation                           Following commands impaired: Follows one step commands inconsistently, Follows one step commands with increased time  Cueing Cueing Techniques: Verbal cues, Tactile cues, Gestural cues  Exercises Other Exercises Other Exercises: L elbow props and push back to midline x5 Other Exercises: dynamic reaching with LUE and sliding cup forward/backward on table placed on L side    General Comments        Pertinent Vitals/Pain Pain Assessment Pain Assessment: No/denies pain     PT Goals  (current goals can now be found in the care plan section) Acute Rehab PT Goals Patient Stated Goal: Unable to state PT Goal Formulation: Patient unable to participate in goal setting Time For Goal Achievement: 09/03/24 Progress towards PT goals: Progressing toward goals    Frequency    Min 2X/week       AM-PAC PT 6 Clicks Mobility   Outcome Measure  Help needed turning from your back to your side while in a flat bed without using bedrails?: A Lot Help needed moving from lying on your back to sitting on the side of a flat bed without using bedrails?: Total Help needed moving to and from a bed to a chair (including a wheelchair)?: Total Help needed standing up from a chair using your arms (e.g., wheelchair or bedside chair)?: Total Help needed to walk in hospital room?: Total Help needed climbing 3-5 steps with a railing? : Total 6 Click Score: 7    End of Session Equipment Utilized During Treatment: Gait belt Activity Tolerance: Patient limited by fatigue;Patient tolerated treatment well Patient left: in bed;with call bell/phone within reach;with family/visitor present;with bed alarm set;with nursing/sitter in room Nurse Communication: Mobility status PT Visit Diagnosis: Hemiplegia and hemiparesis;Unsteadiness on feet (R26.81);Other abnormalities of gait and mobility (R26.89);Muscle weakness (generalized) (M62.81);Difficulty in walking, not elsewhere classified (R26.2);Other symptoms and signs involving the nervous system (R29.898) Hemiplegia - Right/Left: Right Hemiplegia - dominant/non-dominant: Dominant Hemiplegia - caused by: Nontraumatic intracerebral hemorrhage     Time: 1032-1057 PT Time Calculation (min) (ACUTE ONLY): 25 min  Charges:    $Therapeutic Exercise: 8-22 mins $Therapeutic Activity: 8-22 mins PT General Charges $$ ACUTE PT VISIT: 1 Visit                    Darryle George, PTA Acute Rehabilitation Services Secure Chat Preferred  [phone]  [phone]    Darryle George 08/25/2024, 12:34 PM

## 2024-08-26 LAB — GLUCOSE, CAPILLARY
Glucose-Capillary: 113 mg/dL — ABNORMAL HIGH (ref 70–99)
Glucose-Capillary: 119 mg/dL — ABNORMAL HIGH (ref 70–99)
Glucose-Capillary: 145 mg/dL — ABNORMAL HIGH (ref 70–99)
Glucose-Capillary: 173 mg/dL — ABNORMAL HIGH (ref 70–99)
Glucose-Capillary: 302 mg/dL — ABNORMAL HIGH (ref 70–99)
Glucose-Capillary: 302 mg/dL — ABNORMAL HIGH (ref 70–99)
Glucose-Capillary: 80 mg/dL (ref 70–99)

## 2024-08-26 NOTE — Plan of Care (Signed)
  Problem: Intracerebral Hemorrhage Tissue Perfusion: Goal: Complications of Intracerebral Hemorrhage will be minimized 08/26/2024 [id number] by Gaetana Randall Mathew GORMAN, RN Outcome: Progressing 08/26/2024 0515 by Gaetana Randall Mathew GORMAN, RN Outcome: Progressing   Problem: Education: Goal: Ability to describe self-care measures that may prevent or decrease complications (Diabetes Survival Skills Education) will improve 08/26/2024 0614 by Gaetana Randall Mathew GORMAN, RN Outcome: Progressing 08/26/2024 0515 by Gaetana Randall Mathew GORMAN, RN Outcome: Progressing Goal: Individualized Educational Video(s) 08/26/2024 0614 by Gaetana Randall Mathew GORMAN, RN Outcome: Progressing 08/26/2024 0515 by Gaetana Randall Mathew GORMAN, RN Outcome: Progressing   Problem: Fluid Volume: Goal: Ability to maintain a balanced intake and output will improve 08/26/2024 0614 by Gaetana Randall Mathew GORMAN, RN Outcome: Progressing 08/26/2024 0515 by Gaetana Randall Mathew GORMAN, RN Outcome: Progressing   Problem: Metabolic: Goal: Ability to maintain appropriate glucose levels will improve 08/26/2024 0614 by Gaetana Randall Mathew GORMAN, RN Outcome: Progressing 08/26/2024 0515 by Gaetana Randall Mathew GORMAN, RN Outcome: Progressing   Problem: Tissue Perfusion: Goal: Adequacy of tissue perfusion will improve 08/26/2024 0614 by Gaetana Randall Mathew GORMAN, RN Outcome: Progressing 08/26/2024 0515 by Gaetana Randall Mathew GORMAN, RN Outcome: Progressing

## 2024-08-26 NOTE — Plan of Care (Signed)
  Problem: Intracerebral Hemorrhage Tissue Perfusion: Goal: Complications of Intracerebral Hemorrhage will be minimized Outcome: Progressing   Problem: Coping: Goal: Will verbalize positive feelings about self Outcome: Progressing

## 2024-08-26 NOTE — Progress Notes (Signed)
 PROGRESS NOTE    Alec [name]  FMW:[id number] DOB: April 08, 1946 DOA: 08/10/2024 PCP: Loreli Kins, MD  Subjective:  No acute events overnight. Seen and examined at bedside with wife present. No new complaints. Patient unable to provide much history due to altered mentation.   Hospital Course: [phone] with hx DM, HTN, CAD, CHF, RA, COPD who initially presented on 11/11 with R sided weakness and aphasia, found to have L sided subcortical ICH suspected due to uncontrolled hypertension. Hospital course complicated with acute encephalopathy, dysphagia, aspiration pneumonia, acute respiratory failure requiring mechanical ventilation then extubated on 11/21, AKI on CKD 4, hypernatremia, hyperkalemia, and pancytopenia due to critical illness.   Assessment and Plan:  ICH (intracerebral hemorrhage) (HCC) Stable with midline shift, unlikely secondary to uncontrolled hypertension. -Continue holding antiplatelets. - cont statin - PT recommend SNF   Acute respiratory failure with hypoxia (HCC) Patient developed acute hypoxic respiratory failure requiring intubation, extubated on 11/21 and now weaned back to room air. -Continue to monitor -Supplemental oxygen as needed   Hypertension Blood pressure better controlled Patient was on multiple antihypertensives at home with concern of being noncompliant. He was not on any antihypertensives when transferred out of ICU, likely some concern of hypotension with acute decompensation. -cont amlodipine  10 mg daily -cont home Imdur  and hydralazine  at 50 mg 3 times daily -As needed labetalol  - Continue to monitor and add more antihypertensives as needed   Acute encephalopathy Likely in the setting of ICH  - Still having some intermittent agitation  - cont seroquel  at bedtime - cont haldol  IV PRN - monitor clinically   CKD (chronic kidney disease), stage IV (HCC) Patient developed AKI with underlying CKD stage IV during critical illness. Nephrology  followed earlier, not a good candidate for long-term dialysis. Renal function improved, at new baseline now. - Monitor renal function   Type 2 diabetes mellitus with chronic kidney disease, with long-term current use of insulin  (HCC) CBG currently within goal. -Continue with Semglee  and SSI   Aspiration pneumonia (HCC) Patient was started on Unasyn  in ICU for concern of aspiration pneumonia. - finished Unasyn  7-day course on 11/24   COPD (chronic obstructive pulmonary disease) (HCC) No wheezing today. -Continue with bronchodilators   Dysphagia Patient had corTrak in place for feeding which was accidentally removed on 11/23. Able to take medications in applesauce. -Swallow team was reconsulted to reevaluate. -Patient was started on dysphagia 1 diet with honey thick liquid after having barium swallow studies - Dietitian following   Hypernatremia - status post IV hydration - Continue with oral hydration   Metabolic acidosis likely secondary to CKD. - Titrate p.o. bicarb tablets as needed -Continue to monitor   Hyperlipidemia - Continue with Lipitor  DVT prophylaxis: heparin  injection 5,000 Units Start: 08/12/24 1400 SCD's Start: 08/10/24 1915  SQ Heparin    Code Status: Full Code Family Communication: Spoke with wife at bedside and updated on current treatment plan Disposition Plan: SNF Reason for continuing need for hospitalization: monitor oral intake, bed availability  Objective: Vitals:   08/26/24 0615 08/26/24 0732 08/26/24 0808 08/26/24 1119  BP:   (!) 148/63 (!) 156/117  Pulse:  85 72 83  Resp:  18 16 16   Temp:   98.6 F (37 C) (!) 97.5 F (36.4 C)  TempSrc:   Oral Oral  SpO2:   99% 99%  Weight: 84.1 kg     Height:        Intake/Output Summary (Last 24 hours) at 08/26/2024 1146 Last data filed at  08/26/2024 1030 Gross per 24 hour  Intake 240 ml  Output 1550 ml  Net -1310 ml   Filed Weights   08/24/24 0500 08/25/24 0500 08/26/24 0615  Weight: 90.5  kg 90.8 kg 84.1 kg    Examination:  Physical Exam Vitals and nursing note reviewed.  Constitutional:      General: He is not in acute distress.    Appearance: He is ill-appearing.     Comments: frail  HENT:     Head: Normocephalic and atraumatic.  Cardiovascular:     Rate and Rhythm: Normal rate and regular rhythm.     Pulses: Normal pulses.     Heart sounds: Normal heart sounds.  Pulmonary:     Effort: Pulmonary effort is normal.     Breath sounds: Normal breath sounds.  Abdominal:     General: Bowel sounds are normal.     Palpations: Abdomen is soft.  Neurological:     Mental Status: He is alert. He is disoriented.     Data Reviewed: I have personally reviewed following labs and imaging studies  CBC: Recent Labs  Lab 08/21/24 0442 08/22/24 0414 08/23/24 0147 08/24/24 0208 08/25/24 0105  WBC 1.9* 3.7* 3.2* 2.9* 2.5*  HGB 9.0* 9.5* 10.7* 9.8* 9.0*  HCT 27.1* 28.3* 31.7* 29.1* 28.1*  MCV 95.4 93.4 93.5 92.1 95.6  PLT 83* 90* 103* 100* 97*   Basic Metabolic Panel: Recent Labs  Lab 08/20/24 0331 08/21/24 0442 08/22/24 0414 08/23/24 0147 08/24/24 0208 08/25/24 0105  NA 144 147* 143 146* 146* 144  K 4.9 4.9 4.9 5.1 4.7 4.9  CL 113* 116* 114* 114* 117* 118*  CO2 21* 20* 19* 18* 19* 17*  GLUCOSE 175* 192* 160* 88 93 131*  BUN 117* 98* 80* 68* 59* 56*  CREATININE 4.55* 4.02* 3.50* 3.50* 3.45* 3.55*  CALCIUM  8.4* 8.5* 8.5* 9.0 9.0 8.6*  MG 2.9* 2.8* 2.3 2.2  --   --   PHOS 5.1* 4.7* 3.6 3.9 4.0 3.9   GFR: Estimated Creatinine Clearance: 18 mL/min (A) (by C-G formula based on SCr of 3.55 mg/dL (H)). Liver Function Tests: Recent Labs  Lab 08/24/24 0208 08/25/24 0105  ALBUMIN  2.6* 2.6*   No results for input(s): LIPASE, AMYLASE in the last 168 hours. No results for input(s): AMMONIA in the last 168 hours. Coagulation Profile: No results for input(s): INR, PROTIME in the last 168 hours. Cardiac Enzymes: No results for input(s): CKTOTAL,  CKMB, CKMBINDEX, TROPONINI in the last 168 hours. ProBNP, BNP (last 5 results) No results for input(s): PROBNP, BNP in the last 8760 hours. HbA1C: No results for input(s): HGBA1C in the last 72 hours. CBG: Recent Labs  Lab 08/25/24 2032 08/26/24 0032 08/26/24 0412 08/26/24 0809 08/26/24 1120  GLUCAP 213* 145* 113* 119* 173*   Lipid Profile: No results for input(s): CHOL, HDL, LDLCALC, TRIG, CHOLHDL, LDLDIRECT in the last 72 hours. Thyroid  Function Tests: No results for input(s): TSH, T4TOTAL, FREET4, T3FREE, THYROIDAB in the last 72 hours. Anemia Panel: No results for input(s): VITAMINB12, FOLATE, FERRITIN, TIBC, IRON, RETICCTPCT in the last 72 hours. Sepsis Labs: No results for input(s): PROCALCITON, LATICACIDVEN in the last 168 hours.  Recent Results (from the past 240 hours)  Culture, Respiratory w Gram Stain     Status: None   Collection Time: 08/17/24 11:19 AM   Specimen: Tracheal Aspirate; Respiratory  Result Value Ref Range Status   Specimen Description TRACHEAL ASPIRATE  Final   Special Requests NONE  Final   Gram  Stain   Final    MODERATE SQUAMOUS EPITHELIAL CELLS PRESENT FEW WBC PRESENT, PREDOMINANTLY PMN MODERATE GRAM POSITIVE COCCI MODERATE GRAM POSITIVE RODS MODERATE GRAM NEGATIVE RODS    Culture   Final    FEW Normal respiratory flora-no Staph aureus or Pseudomonas seen Performed at [facility], [address] [place], [place] [address]    Report Status 08/19/2024 FINAL  Final     Radiology Studies: No results found.  Scheduled Meds:  allopurinol   50 mg Oral Daily   amLODipine   10 mg Oral Daily   arformoterol   15 mcg Nebulization BID   atorvastatin   40 mg Oral Daily   budesonide  (PULMICORT ) nebulizer solution  0.25 mg Nebulization BID   heparin  injection (subcutaneous)  5,000 Units Subcutaneous Q8H   hydrALAZINE   50 mg Oral Q8H   insulin  aspart  0-20 Units Subcutaneous Q4H   insulin   glargine-yfgn  28 Units Subcutaneous Daily   isosorbide  mononitrate  120 mg Oral Daily   latanoprost   1 drop Both Eyes QHS   multivitamin with minerals  1 tablet Oral Daily   mouth rinse  15 mL Mouth Rinse 4 times per day   polyethylene glycol  17 g Oral Daily   QUEtiapine   25 mg Oral QHS   senna-docusate  1 tablet Oral BID   sertraline   50 mg Oral Daily   sodium bicarbonate   1,300 mg Oral BID   Continuous Infusions:   LOS: 16 days   Norval Bar, MD  [facility]  08/26/2024, 11:46 AM

## 2024-08-26 NOTE — Plan of Care (Signed)
  Problem: Nutrition: Goal: Risk of aspiration will decrease Outcome: Progressing Goal: Dietary intake will improve Outcome: Progressing   Problem: Skin Integrity: Goal: Risk for impaired skin integrity will decrease Outcome: Progressing   Problem: Clinical Measurements: Goal: Ability to maintain clinical measurements within normal limits will improve Outcome: Progressing Goal: Will remain free from infection Outcome: Progressing Goal: Diagnostic test results will improve Outcome: Progressing Goal: Respiratory complications will improve Outcome: Progressing Goal: Cardiovascular complication will be avoided Outcome: Progressing   Problem: Pain Managment: Goal: General experience of comfort will improve and/or be controlled Outcome: Progressing   Problem: Safety: Goal: Ability to remain free from injury will improve Outcome: Progressing   Problem: Skin Integrity: Goal: Risk for impaired skin integrity will decrease Outcome: Progressing

## 2024-08-27 LAB — GLUCOSE, CAPILLARY
Glucose-Capillary: 114 mg/dL — ABNORMAL HIGH (ref 70–99)
Glucose-Capillary: 142 mg/dL — ABNORMAL HIGH (ref 70–99)
Glucose-Capillary: 251 mg/dL — ABNORMAL HIGH (ref 70–99)
Glucose-Capillary: 131 mg/dL — ABNORMAL HIGH (ref 70–99)
Glucose-Capillary: 226 mg/dL — ABNORMAL HIGH (ref 70–99)
Glucose-Capillary: 184 mg/dL — ABNORMAL HIGH (ref 70–99)

## 2024-08-27 MED ORDER — MELATONIN 3 MG PO TABS
3.0000 mg | ORAL_TABLET | Freq: Every day | ORAL | Status: DC
  Administered 2024-08-27 – 2024-09-02 (×7): 3 mg via ORAL
  Filled 2024-08-27 (×7): qty 1

## 2024-08-27 NOTE — Progress Notes (Signed)
 PROGRESS NOTE    Alec [name]  FMW:[id number] DOB: March 25, 1946 DOA: 08/10/2024 PCP: Loreli Kins, MD  Subjective:  No acute events overnight. As per chart review, patient getting IV haldol  consistently overnight but there's no documentation of acute uncontrolled agitation not amenable to verbal cues or redirection. Unable to provide much history due to altered mentation but able to answer simple questions and follow commands. Denies pain, shortness of breath, nausea, constipation. Tolerating oral intake.   Hospital Course: [phone] with hx DM, HTN, CAD, CHF, RA, COPD, and chronic pancytopenia who initially presented on 11/11 with R sided weakness and aphasia, found to have L sided subcortical ICH suspected due to uncontrolled hypertension. Hospital course complicated with acute encephalopathy, dysphagia, aspiration pneumonia, acute respiratory failure requiring mechanical ventilation then extubated on 11/21, AKI on CKD 4, hypernatremia, and hyperkalemia.  Assessment and Plan:  Acute encephalopathy Likely in the setting of ICH  - Still having some intermittent agitation  - cont seroquel  at bedtime - discontinue haldol  IV PRN. Can consider adding as one time dose if not redirectable - start melatonin qHS - monitor I/Os, Bms, and sleep routine - monitor clinically  ICH (intracerebral hemorrhage) (HCC) Stable with midline shift, unlikely secondary to uncontrolled hypertension. -Continue holding antiplatelets. - cont statin - PT recommend SNF   Acute respiratory failure with hypoxia (HCC) Patient developed acute hypoxic respiratory failure requiring intubation, extubated on 11/21 and now weaned back to room air. -Continue to monitor -Supplemental oxygen as needed   Hypertension Blood pressure better controlled Patient was on multiple antihypertensives at home with concern of being noncompliant. He was not on any antihypertensives when transferred out of ICU, likely some concern of  hypotension with acute decompensation. -cont amlodipine  10 mg daily -cont home Imdur  and hydralazine  at 50 mg 3 times daily -As needed labetalol  - as needed hydralazine  - Continue to monitor and add more antihypertensives as needed   CKD (chronic kidney disease), stage IV (HCC) Patient developed AKI with underlying CKD stage IV during critical illness. Nephrology followed earlier, not a good candidate for long-term dialysis. Renal function improved, at new baseline now. - Monitor renal function   Type 2 diabetes mellitus with chronic kidney disease, with long-term current use of insulin  (HCC) CBG currently within goal. -Continue with Semglee  and SSI   Aspiration pneumonia (HCC) Patient was started on Unasyn  in ICU for concern of aspiration pneumonia. - finished Unasyn  7-day course on 11/24   COPD (chronic obstructive pulmonary disease) (HCC) No wheezing today. -Continue with bronchodilators   Dysphagia Patient had corTrak in place for feeding which was accidentally removed on 11/23. Able to take medications in applesauce. -Swallow team was reconsulted to reevaluate. -Patient was started on dysphagia 1 diet with honey thick liquid after having barium swallow studies - Dietitian following   Hypernatremia - status post IV hydration - Continue with oral hydration   Metabolic acidosis likely secondary to CKD. - Titrate p.o. bicarb tablets as needed -Continue to monitor   Hyperlipidemia - Continue with Lipitor  DVT prophylaxis: heparin  injection 5,000 Units Start: 08/12/24 1400 SCD's Start: 08/10/24 1915  SQ Heparin    Code Status: Full Code Family Communication: Discussed with wife at bedside Disposition Plan: SNF Reason for continuing need for hospitalization: needs bed placement, SW assisting  Objective: Vitals:   08/27/24 0451 08/27/24 0725 08/27/24 0746 08/27/24 1116  BP:   (!) 163/68 (!) 148/59  Pulse:    92  Resp:   20 18  Temp:   97.9 F (  36.6 C) 97.9 F  (36.6 C)  TempSrc:   Oral Oral  SpO2:  98% 100% 98%  Weight: 82.3 kg     Height:        Intake/Output Summary (Last 24 hours) at 08/27/2024 1121 Last data filed at 08/27/2024 0446 Gross per 24 hour  Intake 200 ml  Output 1350 ml  Net -1150 ml   Filed Weights   08/25/24 0500 08/26/24 0615 08/27/24 0451  Weight: 90.8 kg 84.1 kg 82.3 kg    Examination:  Physical Exam Vitals and nursing note reviewed.  Constitutional:      General: He is not in acute distress.    Appearance: He is ill-appearing.     Comments: frail  HENT:     Head: Normocephalic and atraumatic.  Cardiovascular:     Rate and Rhythm: Normal rate and regular rhythm.     Pulses: Normal pulses.     Heart sounds: Normal heart sounds.  Pulmonary:     Effort: Pulmonary effort is normal.     Breath sounds: Normal breath sounds.  Abdominal:     General: Bowel sounds are normal.     Palpations: Abdomen is soft.  Neurological:     Mental Status: He is alert. He is disoriented.     Data Reviewed: I have personally reviewed following labs and imaging studies  CBC: Recent Labs  Lab 08/21/24 0442 08/22/24 0414 08/23/24 0147 08/24/24 0208 08/25/24 0105  WBC 1.9* 3.7* 3.2* 2.9* 2.5*  HGB 9.0* 9.5* 10.7* 9.8* 9.0*  HCT 27.1* 28.3* 31.7* 29.1* 28.1*  MCV 95.4 93.4 93.5 92.1 95.6  PLT 83* 90* 103* 100* 97*   Basic Metabolic Panel: Recent Labs  Lab 08/21/24 0442 08/22/24 0414 08/23/24 0147 08/24/24 0208 08/25/24 0105  NA 147* 143 146* 146* 144  K 4.9 4.9 5.1 4.7 4.9  CL 116* 114* 114* 117* 118*  CO2 20* 19* 18* 19* 17*  GLUCOSE 192* 160* 88 93 131*  BUN 98* 80* 68* 59* 56*  CREATININE 4.02* 3.50* 3.50* 3.45* 3.55*  CALCIUM  8.5* 8.5* 9.0 9.0 8.6*  MG 2.8* 2.3 2.2  --   --   PHOS 4.7* 3.6 3.9 4.0 3.9   GFR: Estimated Creatinine Clearance: 17.8 mL/min (A) (by C-G formula based on SCr of 3.55 mg/dL (H)). Liver Function Tests: Recent Labs  Lab 08/24/24 0208 08/25/24 0105  ALBUMIN  2.6* 2.6*    No results for input(s): LIPASE, AMYLASE in the last 168 hours. No results for input(s): AMMONIA in the last 168 hours. Coagulation Profile: No results for input(s): INR, PROTIME in the last 168 hours. Cardiac Enzymes: No results for input(s): CKTOTAL, CKMB, CKMBINDEX, TROPONINI in the last 168 hours. ProBNP, BNP (last 5 results) No results for input(s): PROBNP, BNP in the last 8760 hours. HbA1C: No results for input(s): HGBA1C in the last 72 hours. CBG: Recent Labs  Lab 08/26/24 1946 08/26/24 2340 08/27/24 0404 08/27/24 0802 08/27/24 1116  GLUCAP 302* 80 114* 142* 251*   Lipid Profile: No results for input(s): CHOL, HDL, LDLCALC, TRIG, CHOLHDL, LDLDIRECT in the last 72 hours. Thyroid  Function Tests: No results for input(s): TSH, T4TOTAL, FREET4, T3FREE, THYROIDAB in the last 72 hours. Anemia Panel: No results for input(s): VITAMINB12, FOLATE, FERRITIN, TIBC, IRON, RETICCTPCT in the last 72 hours. Sepsis Labs: No results for input(s): PROCALCITON, LATICACIDVEN in the last 168 hours.  No results found for this or any previous visit (from the past 240 hours).   Radiology Studies: No results found.  Scheduled Meds:  allopurinol   50 mg Oral Daily   amLODipine   10 mg Oral Daily   arformoterol   15 mcg Nebulization BID   atorvastatin   40 mg Oral Daily   budesonide  (PULMICORT ) nebulizer solution  0.25 mg Nebulization BID   heparin  injection (subcutaneous)  5,000 Units Subcutaneous Q8H   hydrALAZINE   50 mg Oral Q8H   insulin  aspart  0-20 Units Subcutaneous Q4H   insulin  glargine-yfgn  28 Units Subcutaneous Daily   isosorbide  mononitrate  120 mg Oral Daily   latanoprost   1 drop Both Eyes QHS   multivitamin with minerals  1 tablet Oral Daily   mouth rinse  15 mL Mouth Rinse 4 times per day   polyethylene glycol  17 g Oral Daily   QUEtiapine   25 mg Oral QHS   senna-docusate  1 tablet Oral BID   sertraline   50 mg  Oral Daily   sodium bicarbonate   1,300 mg Oral BID   Continuous Infusions:   LOS: 17 days   Norval Bar, MD  [facility]  08/27/2024, 11:21 AM

## 2024-08-27 NOTE — Progress Notes (Signed)
 Physical Therapy Treatment Patient Details Name: Alec [name] MRN: [id number] DOB: [birth year] Today's Date: 08/27/2024   History of Present Illness 78 y.o. male adm 08/10/2024 with Rt weakness and aphasia. Pt with large Lt basal ganglia hemorrhage complicated by AKI and aspiration. Intubated 11/18-11/21. PMHx: DM, HTN, CAD s/p CABG, CHF, RA, COPD, CKD, bladder & prostate CA, anxiety, gout, HLD    PT Comments  Pt received in supine and agreeable to session. Session focused on sitting balance, transfers, and R side attention with pt demonstrating improved awareness and balance by end of session. Pt initially demonstrates LUE pushing sitting EOB, but improves with L elbow prop, blanket placed under R hip, and use of mirror for visual feedback. Pt noted to independently use mirror to correct R lateral lean intermittently and demonstrates improved righting reactions this session. Pt able to perform 3 stands from EOB with improved initiation, but requires up to max A +2 to maintain standing due to R knee buckling and R lateral lean. Pt continues to benefit from PT services to progress toward functional mobility goals.    If plan is discharge home, recommend the following: Two people to help with walking and/or transfers;Two people to help with bathing/dressing/bathroom;Assistance with feeding;Direct supervision/assist for medications management;Direct supervision/assist for financial management;Assist for transportation;Help with stairs or ramp for entrance;Supervision due to cognitive status   Can travel by private vehicle     No  Equipment Recommendations  Wheelchair (measurements PT);Wheelchair cushion (measurements PT);Hospital bed;Hoyer lift;BSC/3in1    Recommendations for Other Services       Precautions / Restrictions Precautions Precautions: Fall;Other (comment) Recall of Precautions/Restrictions: Impaired Precaution/Restrictions Comments: SBP <160; pushes to the  R Restrictions Weight Bearing Restrictions Per Provider Order: No     Mobility  Bed Mobility Overal bed mobility: Needs Assistance Bed Mobility: Supine to Sit, Sit to Supine     Supine to sit: Max assist, +2 for physical assistance Sit to supine: Max assist, +2 for physical assistance   General bed mobility comments: Increased cues for initiation with difficulty exiting to R, likely due to inattention    Transfers Overall transfer level: Needs assistance Equipment used: 2 person hand held assist Transfers: Sit to/from Stand Sit to Stand: Mod assist, +2 physical assistance, From elevated surface           General transfer comment: STS from EOB x3 with mod A +2 for power up and max A +2 to maintain standing due to RLE weakness and intermittent L knee buckling    Ambulation/Gait                   Stairs             Wheelchair Mobility     Tilt Bed    Modified Rankin (Stroke Patients Only) Modified Rankin (Stroke Patients Only) Pre-Morbid Rankin Score: No symptoms Modified Rankin: Severe disability     Balance Overall balance assessment: Needs assistance Sitting-balance support: Feet supported, No upper extremity supported Sitting balance-Leahy Scale: Poor Sitting balance - Comments: R lateral lean with initial pushing requiring mod A to maintain balance. Progressed to min A- brief periods of CGA with improved righting reactions and use of mirror for visual feedback   Standing balance support: Bilateral upper extremity supported, Reliant on assistive device for balance Standing balance-Leahy Scale: Zero Standing balance comment: reliant on max A +2 to maintain standing  Communication Communication Communication: Impaired Factors Affecting Communication: Difficulty expressing self;Reduced clarity of speech  Cognition Arousal: Alert Behavior During Therapy: WFL for tasks assessed/performed   PT - Cognitive  impairments: Awareness, Safety/Judgement, Problem solving, Initiation, Attention                         Following commands: Impaired Following commands impaired: Follows one step commands inconsistently, Follows one step commands with increased time    Cueing Cueing Techniques: Verbal cues, Tactile cues, Gestural cues  Exercises      General Comments        Pertinent Vitals/Pain Pain Assessment Pain Assessment: No/denies pain     PT Goals (current goals can now be found in the care plan section) Acute Rehab PT Goals Patient Stated Goal: Unable to state PT Goal Formulation: Patient unable to participate in goal setting Time For Goal Achievement: 09/03/24 Progress towards PT goals: Progressing toward goals    Frequency    Min 2X/week           Co-evaluation PT/OT/SLP Co-Evaluation/Treatment: Yes Reason for Co-Treatment: Complexity of the patient's impairments (multi-system involvement);For patient/therapist safety;To address functional/ADL transfers PT goals addressed during session: Strengthening/ROM;Balance;Mobility/safety with mobility        AM-PAC PT 6 Clicks Mobility   Outcome Measure  Help needed turning from your back to your side while in a flat bed without using bedrails?: A Lot Help needed moving from lying on your back to sitting on the side of a flat bed without using bedrails?: Total Help needed moving to and from a bed to a chair (including a wheelchair)?: Total Help needed standing up from a chair using your arms (e.g., wheelchair or bedside chair)?: Total Help needed to walk in hospital room?: Total Help needed climbing 3-5 steps with a railing? : Total 6 Click Score: 7    End of Session Equipment Utilized During Treatment: Gait belt Activity Tolerance: Patient tolerated treatment well Patient left: in bed;with call bell/phone within reach;with bed alarm set Nurse Communication: Mobility status PT Visit Diagnosis: Hemiplegia and  hemiparesis;Unsteadiness on feet (R26.81);Other abnormalities of gait and mobility (R26.89);Muscle weakness (generalized) (M62.81);Difficulty in walking, not elsewhere classified (R26.2);Other symptoms and signs involving the nervous system (R29.898) Hemiplegia - Right/Left: Right Hemiplegia - dominant/non-dominant: Dominant Hemiplegia - caused by: Nontraumatic intracerebral hemorrhage     Time: 9195-9164 PT Time Calculation (min) (ACUTE ONLY): 31 min  Charges:    $Therapeutic Activity: 8-22 mins PT General Charges $$ ACUTE PT VISIT: 1 Visit                    Darryle George, PTA Acute Rehabilitation Services Secure Chat Preferred  [phone]    Darryle George 08/27/2024, 8:53 AM

## 2024-08-27 NOTE — Progress Notes (Signed)
 Occupational Therapy Treatment Patient Details Name: Alec [name] MRN: [id number] DOB: 04/12/1946 Today's Date: 08/27/2024   History of present illness 78 y.o. male adm 08/10/2024 with Rt weakness and aphasia. Pt with large Lt basal ganglia hemorrhage complicated by AKI and aspiration. Intubated 11/18-11/21. PMHx: DM, HTN, CAD s/p CABG, CHF, RA, COPD, CKD, bladder & prostate CA, anxiety, gout, HLD   OT comments  Pt greeted in bed, agreeable for OT visit. Initially drowsy but wakefulness greatly improved with EOB mobility. Maintained alertness and was engaged for entire duration of session, co-tx with PT today to maximize pt's progression. RUE remains flaccid, with heavy R inattention. Pt was max A +2 for supine<>sit and needed mod A initially to maintain static sitting. He progressed to min A - bouts of CGA with blanket propped beneath R hip and mirror for improved biofeedback. Pt requiring hand-over-hand to reach LUE across midline to retrieve grooming items; needs min/mod A for simple grooming at EOB. Facilitating functional movement patterns in RUE via face washing. Pt with great progress today, OT to continue to follow.      If plan is discharge home, recommend the following:  Two people to help with walking and/or transfers;A lot of help with walking and/or transfers;A lot of help with bathing/dressing/bathroom;Two people to help with bathing/dressing/bathroom;Assistance with cooking/housework;Direct supervision/assist for medications management;Direct supervision/assist for financial management;Assist for transportation;Help with stairs or ramp for entrance   Equipment Recommendations  Other (comment) (defer)    Recommendations for Other Services      Precautions / Restrictions Precautions Precautions: Fall;Other (comment) Recall of Precautions/Restrictions: Impaired Precaution/Restrictions Comments: SBP <160; pushes to the R Restrictions Weight Bearing Restrictions Per Provider  Order: No       Mobility Bed Mobility Overal bed mobility: Needs Assistance Bed Mobility: Supine to Sit, Sit to Supine     Supine to sit: Max assist, +2 for physical assistance, +2 for safety/equipment, HOB elevated, Used rails Sit to supine: Max assist, +2 for physical assistance, +2 for safety/equipment, HOB elevated   General bed mobility comments: Exited to the R side, incr time to initiate moving RLE in/out of bed, cues for RUE mgmt    Transfers Overall transfer level: Needs assistance Equipment used: 2 person hand held assist Transfers: Sit to/from Stand Sit to Stand: Mod assist, +2 physical assistance, +2 safety/equipment, From elevated surface           General transfer comment: Stood from bed heigh x3 with R knee blocked and assist for trunk/proximal R shoulder elevation; needs max A +2 to maintain stance. L knee buckling when therapist assisting with WS to advance RLE.     Balance Overall balance assessment: Needs assistance Sitting-balance support: Single extremity supported, Feet supported Sitting balance-Leahy Scale: Poor Sitting balance - Comments: initially mod A, progressing to min/bouts of CGA. Overall sitting balance improved with blanket propped beneath R hip and with mirror for biofeedback. Improved righting reactions and use of LUE on footboard to correct leans. Postural control: Posterior lean, Right lateral lean Standing balance support: Bilateral upper extremity supported, During functional activity Standing balance-Leahy Scale: Zero Standing balance comment: requires max A +2 to maintain stance with R knee blocked                           ADL either performed or assessed with clinical judgement   ADL   Eating/Feeding: Moderate assistance;Supervision/ safety;Bed level   Grooming: Wash/dry face;Moderate assistance;Sitting;Cueing for sequencing Grooming Details (indicate  cue type and reason): cues to thoroughly wash R side of face, cued  for accurately placing washcloth in OT's hand by crossing midline to the R, assist for item retrieval and locating washcloth in R environment             Lower Body Dressing: Total assistance;Bed level Lower Body Dressing Details (indicate cue type and reason): donning B socks                    Extremity/Trunk Assessment Upper Extremity Assessment Upper Extremity Assessment: RUE deficits/detail RUE Deficits / Details: flaccid            Vision   Vision Assessment?: Vision impaired- to be further tested in functional context Additional Comments: pt with decreased gaze to the R but does intermittently fixate on visual stim in R environment   Perception Perception Perception: Impaired Preception Impairment Details: Inattention/Neglect Perception-Other Comments: R inattention, very poor attention and awareness of RHB/R environment   Praxis Praxis Praxis: Impaired Praxis Impairment Details: Initiation;Motor planning;Limb apraxia   Communication Communication Communication: Impaired Factors Affecting Communication: Difficulty expressing self;Reduced clarity of speech   Cognition Arousal: Alert Behavior During Therapy: WFL for tasks assessed/performed Cognition: Difficult to assess Difficult to assess due to: Impaired communication           OT - Cognition Comments: pt with poor attention span, needs frequent redirection, nods head to yes/no questions, impaired sequencing & problem solvingq                 Following commands: Impaired Following commands impaired: Follows one step commands inconsistently, Follows one step commands with increased time      Cueing   Cueing Techniques: Verbal cues, Tactile cues, Gestural cues, Visual cues (use of mirror)  Exercises      Shoulder Instructions       General Comments      Pertinent Vitals/ Pain       Pain Assessment Pain Assessment: No/denies pain Pain Score: 0-No pain  Home Living                                           Prior Functioning/Environment              Frequency  Min 2X/week        Progress Toward Goals  OT Goals(current goals can now be found in the care plan section)  Progress towards OT goals: Progressing toward goals     Plan      Co-evaluation    PT/OT/SLP Co-Evaluation/Treatment: Yes Reason for Co-Treatment: Necessary to address cognition/behavior during functional activity;To address functional/ADL transfers;For patient/therapist safety PT goals addressed during session: Strengthening/ROM;Balance;Mobility/safety with mobility OT goals addressed during session: ADL's and self-care      AM-PAC OT 6 Clicks Daily Activity     Outcome Measure   Help from another person eating meals?: A Lot Help from another person taking care of personal grooming?: A Little Help from another person toileting, which includes using toliet, bedpan, or urinal?: Total Help from another person bathing (including washing, rinsing, drying)?: A Lot Help from another person to put on and taking off regular upper body clothing?: A Lot Help from another person to put on and taking off regular lower body clothing?: Total 6 Click Score: 11    End of Session Equipment Utilized During Treatment: Gait belt  OT Visit  Diagnosis: Unsteadiness on feet (R26.81);Other abnormalities of gait and mobility (R26.89);Muscle weakness (generalized) (M62.81);Hemiplegia and hemiparesis Hemiplegia - Right/Left: Right Hemiplegia - dominant/non-dominant: Dominant Hemiplegia - caused by: Other Nontraumatic intracranial hemorrhage   Activity Tolerance Patient tolerated treatment well   Patient Left in bed;with call bell/phone within reach;with bed alarm set   Nurse Communication          Time: 9195-9167 OT Time Calculation (min): 28 min  Charges: OT General Charges $OT Visit: 1 Visit OT Treatments $Self Care/Home Management : 8-22 mins  Jayvan Mcshan M. [place],  OTR/L [facility] Acute Rehabilitation Services [phone] Secure Chat Preferred  Natayah Warmack 08/27/2024, 9:42 AM

## 2024-08-27 NOTE — Progress Notes (Signed)
 SLP Cancellation Note  Patient Details Name: Alec [name] MRN: [id number] DOB: 31-Jul-1946   Cancelled treatment:       Reason Eval/Treat Not Completed: Patient's level of consciousness   Adaya Garmany, Consuelo Fitch 08/27/2024, 1:52 PM

## 2024-08-27 NOTE — Plan of Care (Signed)
  Problem: Coping: Goal: Will verbalize positive feelings about self Outcome: Progressing   Problem: Nutrition: Goal: Risk of aspiration will decrease Outcome: Progressing Goal: Dietary intake will improve Outcome: Progressing   Problem: Skin Integrity: Goal: Risk for impaired skin integrity will decrease Outcome: Progressing

## 2024-08-27 NOTE — TOC Progression Note (Addendum)
 Transition of Care [facility]) - Progression Note    Patient Details  Name: Alec [name] MRN: [id number] Date of Birth: 08/18/1946  [facility]) CM/SW Contact  Gwenn Frieze [place], [place] Phone Number: 08/27/2024, 11:34 AM  Clinical Narrative:  Left voicemail for pt's spouse Lonell re SNF choice. Will provide updates as available.    1148: Return call received from pt's wife and she is requesting [facility]. Spoke to Brittany at [facility] who reports they can potentially offer a bed but want to monitor pt's behavior and continued need for IV haldol . Pt's wife aware of tentative offer from [facility] and reports she prefers [facility] as back up plan.   Frieze Gwenn, MSW, LCSW [phone] (coverage)   Frieze Gwenn, MSW, LCSW [phone] (coverage)       Expected Discharge Plan: Skilled Nursing Facility Barriers to Discharge: Continued Medical Work up               Expected Discharge Plan and Services                                               Social Drivers of Health (SDOH) Interventions SDOH Screenings   Food Insecurity: Patient Unable To Answer (08/13/2024)  Housing: Patient Unable To Answer (08/13/2024)  Transportation Needs: Patient Unable To Answer (08/13/2024)  Utilities: Patient Unable To Answer (08/13/2024)  Depression (PHQ2-9): Low Risk  (02/27/2023)  Social Connections: Patient Unable To Answer (08/13/2024)  Tobacco Use: Medium Risk (08/10/2024)    Readmission Risk Interventions    03/17/2023    3:00 PM 03/29/2022    2:24 PM  Readmission Risk Prevention Plan  Transportation Screening Complete Complete  PCP or Specialist Appt within 5-7 Days  Complete  PCP or Specialist Appt within 3-5 Days Complete   Home Care Screening  Complete  Medication Review (RN CM)  Complete  HRI or Home Care Consult Complete   Palliative Care Screening Not Applicable   Medication Review (RN Care Manager) Complete

## 2024-08-27 NOTE — Plan of Care (Signed)
  Problem: Nutrition: Goal: Risk of aspiration will decrease Outcome: Progressing Goal: Dietary intake will improve Outcome: Progressing   Problem: Fluid Volume: Goal: Ability to maintain a balanced intake and output will improve Outcome: Progressing   Problem: Nutritional: Goal: Maintenance of adequate nutrition will improve Outcome: Progressing Goal: Progress toward achieving an optimal weight will improve Outcome: Progressing   Problem: Skin Integrity: Goal: Risk for impaired skin integrity will decrease Outcome: Progressing   Problem: Clinical Measurements: Goal: Ability to maintain clinical measurements within normal limits will improve Outcome: Progressing Goal: Will remain free from infection Outcome: Progressing Goal: Diagnostic test results will improve Outcome: Progressing Goal: Respiratory complications will improve Outcome: Progressing Goal: Cardiovascular complication will be avoided Outcome: Progressing   Problem: Activity: Goal: Risk for activity intolerance will decrease Outcome: Progressing   Problem: Elimination: Goal: Will not experience complications related to bowel motility Outcome: Progressing Goal: Will not experience complications related to urinary retention Outcome: Progressing   Problem: Pain Managment: Goal: General experience of comfort will improve and/or be controlled Outcome: Progressing   Problem: Skin Integrity: Goal: Risk for impaired skin integrity will decrease Outcome: Progressing

## 2024-08-28 LAB — GLUCOSE, CAPILLARY
Glucose-Capillary: 129 mg/dL — ABNORMAL HIGH (ref 70–99)
Glucose-Capillary: 153 mg/dL — ABNORMAL HIGH (ref 70–99)
Glucose-Capillary: 186 mg/dL — ABNORMAL HIGH (ref 70–99)
Glucose-Capillary: 189 mg/dL — ABNORMAL HIGH (ref 70–99)
Glucose-Capillary: 213 mg/dL — ABNORMAL HIGH (ref 70–99)
Glucose-Capillary: 93 mg/dL (ref 70–99)

## 2024-08-28 MED ORDER — HYDRALAZINE HCL 50 MG PO TABS
100.0000 mg | ORAL_TABLET | Freq: Three times a day (TID) | ORAL | Status: DC
  Administered 2024-08-28 – 2024-09-02 (×16): 100 mg via ORAL
  Filled 2024-08-28 (×16): qty 2

## 2024-08-28 NOTE — Progress Notes (Signed)
 PROGRESS NOTE    Alec [name]  FMW:[id number] DOB: 1946/08/15 DOA: 08/10/2024 PCP: Loreli Kins, MD  Subjective:  No acute events overnight. Seen and examined at bedside with wife present. No new complaints. As per chart review, received IV haldol  again overnight despite no documentation of acute uncontrolled agitation not amenable to verbal cues or redirection.   Hospital Course: [phone] with hx DM, HTN, CAD, CHF, RA, COPD, and chronic pancytopenia who initially presented on 11/11 with R sided weakness and aphasia, found to have L sided subcortical ICH suspected due to uncontrolled hypertension. Hospital course complicated with acute encephalopathy, dysphagia, aspiration pneumonia, acute respiratory failure requiring mechanical ventilation then extubated on 11/21, AKI on CKD 4, hypernatremia, and hyperkalemia.  Assessment and Plan:   Acute encephalopathy Likely in the setting of ICH  - waxing and waning course  - cont seroquel  at bedtime - discontinued haldol  IV PRN. Can consider adding as one time dose if not redirectable but need to document such episodes in the chart to help with medication titrations/adjustements - cont melatonin qHS - monitor I/Os, Bms, and sleep routine - monitor clinically   ICH (intracerebral hemorrhage) (HCC) Stable with midline shift, unlikely secondary to uncontrolled hypertension. -Continue holding antiplatelets. - cont statin - PT recommend SNF   Acute respiratory failure with hypoxia (HCC) Patient developed acute hypoxic respiratory failure requiring intubation, extubated on 11/21 and now weaned back to room air. -Continue to monitor -Supplemental oxygen as needed   Hypertension Blood pressure better controlled Patient was on multiple antihypertensives at home with concern of being noncompliant. He was not on any antihypertensives when transferred out of ICU, likely some concern of hypotension with acute decompensation. -cont amlodipine  10 mg  daily -cont home Imdur   - cont hydralazine  100 mg 3 times daily -As needed labetalol  - as needed hydralazine  - Continue to monitor and add more antihypertensives as needed   CKD (chronic kidney disease), stage IV (HCC) Patient developed AKI with underlying CKD stage IV during critical illness. Nephrology followed earlier, not a good candidate for long-term dialysis. Renal function improved, at new baseline now around 4. - Monitor renal function - will need to follow up with nephrology outpatient   Type 2 diabetes mellitus with chronic kidney disease, with long-term current use of insulin  (HCC) CBG currently within goal. -Continue with Semglee  and SSI   Aspiration pneumonia (HCC) Patient was started on Unasyn  in ICU for concern of aspiration pneumonia. Finished Unasyn  7-day course on 11/24   COPD (chronic obstructive pulmonary disease) (HCC) -Continue with bronchodilators   Dysphagia Improving -Patient was started on dysphagia 1 diet with honey thick liquid after having barium swallow studies - Dietitian following -Swallow team following   Hypernatremia - status post IV hydration - Continue with oral hydration   Metabolic acidosis likely secondary to CKD. - Titrate p.o. bicarb tablets as needed -Continue to monitor   Hyperlipidemia - Continue with Lipitor  DVT prophylaxis: heparin  injection 5,000 Units Start: 08/12/24 1400 SCD's Start: 08/10/24 1915  SQ Heparin    Code Status: Full Code Family Communication: updated wife at bedside Disposition Plan: SNF Reason for continuing need for hospitalization: Medically ready, Placement, SW following  Objective: Vitals:   08/28/24 0500 08/28/24 0757 08/28/24 0819 08/28/24 1042  BP:  (!) 175/77  (!) 123/109  Pulse:  76 74 (!) 107  Resp:  19 18   Temp:  97.8 F (36.6 C)  97.8 F (36.6 C)  TempSrc:  Oral    SpO2:  100% 97%  98%  Weight: 82.8 kg     Height:        Intake/Output Summary (Last 24 hours) at 08/28/2024  1158 Last data filed at 08/28/2024 1000 Gross per 24 hour  Intake 200 ml  Output 700 ml  Net -500 ml   Filed Weights   08/26/24 0615 08/27/24 0451 08/28/24 0500  Weight: 84.1 kg 82.3 kg 82.8 kg    Examination:  Physical Exam Vitals and nursing note reviewed.  Constitutional:      General: He is not in acute distress.    Appearance: He is ill-appearing.     Comments: Weak, frail  HENT:     Head: Normocephalic and atraumatic.  Cardiovascular:     Rate and Rhythm: Normal rate and regular rhythm.     Pulses: Normal pulses.     Heart sounds: Normal heart sounds.  Pulmonary:     Effort: Pulmonary effort is normal.     Breath sounds: Normal breath sounds.  Abdominal:     General: Bowel sounds are normal.     Palpations: Abdomen is soft.  Neurological:     Mental Status: He is alert. He is disoriented.     Data Reviewed: I have personally reviewed following labs and imaging studies  CBC: Recent Labs  Lab 08/22/24 0414 08/23/24 0147 08/24/24 0208 08/25/24 0105  WBC 3.7* 3.2* 2.9* 2.5*  HGB 9.5* 10.7* 9.8* 9.0*  HCT 28.3* 31.7* 29.1* 28.1*  MCV 93.4 93.5 92.1 95.6  PLT 90* 103* 100* 97*   Basic Metabolic Panel: Recent Labs  Lab 08/22/24 0414 08/23/24 0147 08/24/24 0208 08/25/24 0105  NA 143 146* 146* 144  K 4.9 5.1 4.7 4.9  CL 114* 114* 117* 118*  CO2 19* 18* 19* 17*  GLUCOSE 160* 88 93 131*  BUN 80* 68* 59* 56*  CREATININE 3.50* 3.50* 3.45* 3.55*  CALCIUM  8.5* 9.0 9.0 8.6*  MG 2.3 2.2  --   --   PHOS 3.6 3.9 4.0 3.9   GFR: Estimated Creatinine Clearance: 17.9 mL/min (A) (by C-G formula based on SCr of 3.55 mg/dL (H)). Liver Function Tests: Recent Labs  Lab 08/24/24 0208 08/25/24 0105  ALBUMIN  2.6* 2.6*   No results for input(s): LIPASE, AMYLASE in the last 168 hours. No results for input(s): AMMONIA in the last 168 hours. Coagulation Profile: No results for input(s): INR, PROTIME in the last 168 hours. Cardiac Enzymes: No results  for input(s): CKTOTAL, CKMB, CKMBINDEX, TROPONINI in the last 168 hours. ProBNP, BNP (last 5 results) No results for input(s): PROBNP, BNP in the last 8760 hours. HbA1C: No results for input(s): HGBA1C in the last 72 hours. CBG: Recent Labs  Lab 08/27/24 2005 08/27/24 2316 08/28/24 0316 08/28/24 0758 08/28/24 1141  GLUCAP 226* 184* 93 153* 213*   Lipid Profile: No results for input(s): CHOL, HDL, LDLCALC, TRIG, CHOLHDL, LDLDIRECT in the last 72 hours. Thyroid  Function Tests: No results for input(s): TSH, T4TOTAL, FREET4, T3FREE, THYROIDAB in the last 72 hours. Anemia Panel: No results for input(s): VITAMINB12, FOLATE, FERRITIN, TIBC, IRON, RETICCTPCT in the last 72 hours. Sepsis Labs: No results for input(s): PROCALCITON, LATICACIDVEN in the last 168 hours.  No results found for this or any previous visit (from the past 240 hours).   Radiology Studies: No results found.  Scheduled Meds:  allopurinol   50 mg Oral Daily   amLODipine   10 mg Oral Daily   arformoterol   15 mcg Nebulization BID   atorvastatin   40 mg Oral Daily  budesonide  (PULMICORT ) nebulizer solution  0.25 mg Nebulization BID   heparin  injection (subcutaneous)  5,000 Units Subcutaneous Q8H   hydrALAZINE   100 mg Oral Q8H   insulin  aspart  0-20 Units Subcutaneous Q4H   insulin  glargine-yfgn  28 Units Subcutaneous Daily   isosorbide  mononitrate  120 mg Oral Daily   latanoprost   1 drop Both Eyes QHS   melatonin  3 mg Oral QHS   multivitamin with minerals  1 tablet Oral Daily   mouth rinse  15 mL Mouth Rinse 4 times per day   polyethylene glycol  17 g Oral Daily   QUEtiapine   25 mg Oral QHS   senna-docusate  1 tablet Oral BID   sertraline   50 mg Oral Daily   sodium bicarbonate   1,300 mg Oral BID   Continuous Infusions:   LOS: 18 days   Norval Bar, MD  [facility]  08/28/2024, 11:58 AM

## 2024-08-28 NOTE — Plan of Care (Signed)
  Problem: Intracerebral Hemorrhage Tissue Perfusion: Goal: Complications of Intracerebral Hemorrhage will be minimized Outcome: Progressing   Problem: Nutrition: Goal: Risk of aspiration will decrease Outcome: Progressing   Problem: Tissue Perfusion: Goal: Adequacy of tissue perfusion will improve Outcome: Progressing   Problem: Self-Care: Goal: Ability to communicate needs accurately will improve Outcome: Not Progressing

## 2024-08-28 NOTE — Progress Notes (Signed)
 Speech Language Pathology Treatment: Dysphagia;Cognitive-Linguistic  Patient Details Name: Alec [name] MRN: [id number] DOB: 01-13-1946 Today's Date: 08/28/2024 Time: 8954-8897 SLP Time Calculation (min) (ACUTE ONLY): 17 min  Assessment / Plan / Recommendation Clinical Impression  SWALLOWING Pt seen for ongoing dysphagia management. SLP provided oral care prior to administration of POs.  Today pt tolerated puree and honey/moderately thick liquid by spoon.  Pt with good tolerance of single cup sip of HTL.  No oral holding observed with fairly prompt transit and swallow reflex.  Pt did have some R side residue with puree.  This was cleared by following puree with spoonful of moderately thick liquid.  Wife reports good oral intake with extra time.  Hopefully pt will continue to meet nutritional needs orally.  Pt may benefit from repeat instrumental assessment prior to discharge to determine if diet can be advanced.  Lonell reports pt likely to d/c to SNF next week.  SLP to follow for ongoing therapy and possible repeat instrumental evaluation.  SPEECH Pt continues to present with dysarthric speech.  Today, pt's speech intelligibility fluctuated with level of alertness.  Wife Lonell reports pt has been comprehensible at single syllable word level.  Today pt said Real good very clearly, otherwise comprehensible in known context.  Some perseveration noted.  Pt encouraged to complete rote speech task to practice dysarthria strategies, but could not maintain level of alertness to participate.   HPI HPI: SABURO LUGER [name] is a 78 y.o. male who presented 11/11 with right-sided weakness and aphasia. Head CT with large L hemorrage. CT 11/12: Interval increase in size of left basal ganglia hemorrhage, now measuring 3.2 x 3.6 x 2.9 cm (estimated volume 17 mL, previously 11 mL). Mild increase in surrounding edema without significant midline shift. Pt became obtunded on 11/18 and was intubated and  transferred to ICU. Pt with hx of diabetes, hypertension, CAD, CHF, RA, COPD.  Patient underwent MBS on 1124 and was advised to have pure and honey thick liquid via teaspoon.      SLP Plan  Continue with current plan of care          Recommendations  Diet recommendations: Dysphagia 1 (puree);Honey-thick liquid Liquids provided via: Teaspoon Medication Administration: Crushed with puree Supervision: Full supervision/cueing for compensatory strategies Compensations: Slow rate;Small sips/bites;Other (Comment);Follow solids with liquid                  Oral care BID   Frequent or constant Supervision/Assistance Dysphagia, oropharyngeal phase (R13.12);Dysarthria and anarthria (R47.1)     Continue with current plan of care     [name], MA, CCC-SLP Acute Rehabilitation Services Office: [phone] 08/28/2024, 11:08 AM

## 2024-08-29 LAB — BASIC METABOLIC PANEL WITH GFR
Anion gap: 13 (ref 5–15)
Anion gap: 11 (ref 5–15)
BUN: 58 mg/dL — ABNORMAL HIGH (ref 8–23)
BUN: 62 mg/dL — ABNORMAL HIGH (ref 8–23)
CO2: 20 mmol/L — ABNORMAL LOW (ref 22–32)
CO2: 20 mmol/L — ABNORMAL LOW (ref 22–32)
Calcium: 9.6 mg/dL (ref 8.9–10.3)
Calcium: 9 mg/dL (ref 8.9–10.3)
Chloride: 119 mmol/L — ABNORMAL HIGH (ref 98–111)
Chloride: 118 mmol/L — ABNORMAL HIGH (ref 98–111)
Creatinine, Ser: 4.31 mg/dL — ABNORMAL HIGH (ref 0.61–1.24)
Creatinine, Ser: 4.46 mg/dL — ABNORMAL HIGH (ref 0.61–1.24)
GFR, Estimated: 14 mL/min — ABNORMAL LOW (ref >60)
GFR, Estimated: 13 mL/min — ABNORMAL LOW (ref >60)
Glucose, Bld: 128 mg/dL — ABNORMAL HIGH (ref 70–99)
Glucose, Bld: 256 mg/dL — ABNORMAL HIGH (ref 70–99)
Potassium: 4.3 mmol/L (ref 3.5–5.1)
Potassium: 4.2 mmol/L (ref 3.5–5.1)
Sodium: 152 mmol/L — ABNORMAL HIGH (ref 135–145)
Sodium: 149 mmol/L — ABNORMAL HIGH (ref 135–145)

## 2024-08-29 LAB — CBC
HCT: 33.1 % — ABNORMAL LOW (ref 39.0–52.0)
Hemoglobin: 10.9 g/dL — ABNORMAL LOW (ref 13.0–17.0)
MCH: 31.5 pg (ref 26.0–34.0)
MCHC: 32.9 g/dL (ref 30.0–36.0)
MCV: 95.7 fL (ref 80.0–100.0)
Platelets: 156 K/uL (ref 150–400)
RBC: 3.46 MIL/uL — ABNORMAL LOW (ref 4.22–5.81)
RDW: 14.2 % (ref 11.5–15.5)
WBC: 4.4 K/uL (ref 4.0–10.5)
nRBC: 0 % (ref 0.0–0.2)

## 2024-08-29 LAB — GLUCOSE, CAPILLARY
Glucose-Capillary: 121 mg/dL — ABNORMAL HIGH (ref 70–99)
Glucose-Capillary: 119 mg/dL — ABNORMAL HIGH (ref 70–99)
Glucose-Capillary: 197 mg/dL — ABNORMAL HIGH (ref 70–99)
Glucose-Capillary: 184 mg/dL — ABNORMAL HIGH (ref 70–99)
Glucose-Capillary: 122 mg/dL — ABNORMAL HIGH (ref 70–99)
Glucose-Capillary: 159 mg/dL — ABNORMAL HIGH (ref 70–99)

## 2024-08-29 MED ORDER — DEXTROSE 5 % IV SOLN: INTRAVENOUS | Status: DC

## 2024-08-29 NOTE — Plan of Care (Signed)
  Problem: Nutrition: Goal: Dietary intake will improve Outcome: Progressing   Problem: Coping: Goal: Will verbalize positive feelings about self Outcome: Not Progressing   Problem: Self-Care: Goal: Ability to participate in self-care as condition permits will improve Outcome: Not Progressing Goal: Verbalization of feelings and concerns over difficulty with self-care will improve Outcome: Not Progressing

## 2024-08-29 NOTE — Progress Notes (Signed)
 PROGRESS NOTE    Alec [name]  FMW:[id number] DOB: [birth year] DOA: 08/10/2024 PCP: Loreli Kins, MD  Subjective: No new subjective & objective note has been filed under this hospital service since the last note was generated. No acute events overnight. Seen and examined at bedside. Appears sleep this morning. Unable to provide much history due to dysarthria and possible confusion.    Hospital Course: [phone] with hx DM, HTN, CAD, CHF, RA, COPD, and chronic pancytopenia who initially presented on 11/11 with R sided weakness and aphasia, found to have L sided subcortical ICH suspected due to uncontrolled hypertension. Hospital course complicated with acute encephalopathy, dysphagia, aspiration pneumonia, acute respiratory failure requiring mechanical ventilation then extubated on 11/21, AKI on CKD 4, hypernatremia, and hyperkalemia.  Assessment and Plan:  Acute encephalopathy Likely in the setting of ICH  - waxing and waning course  - cont seroquel  at bedtime - discontinued haldol  IV PRN. Can consider adding as one time dose if not redirectable but need to document such episodes in the chart to help with medication titrations/adjustements - cont melatonin qHS - monitor I/Os, Bms, and sleep routine - monitor clinically  Hypernatremia - recurrent in nature - concern of intermittent poor oral intake  - start D5W - encourage oral hydration - monitor BMP  CKD (chronic kidney disease), stage IV (HCC) Patient developed AKI with underlying CKD stage IV during critical illness. Nephrology followed earlier, not a good candidate for long-term dialysis.  - Cr 4.31, baseline now around 4. - IV fluids as elsewhere - Encourage oral hydration - renal dose meds, avoid nephrotoxic agents - Monitor renal function - will need to follow up with nephrology outpatient   ICH (intracerebral hemorrhage) (HCC) Stable with midline shift, unlikely secondary to uncontrolled hypertension. -Continue  holding antiplatelets. - cont statin - PT recommend SNF   Acute respiratory failure with hypoxia (HCC) Patient developed acute hypoxic respiratory failure requiring intubation, extubated on 11/21 and now weaned back to room air. -Continue to monitor -Supplemental oxygen as needed   Hypertension Blood pressure better controlled Patient was on multiple antihypertensives at home with concern of being noncompliant. He was not on any antihypertensives when transferred out of ICU, likely some concern of hypotension with acute decompensation. -cont amlodipine  10 mg daily -cont home Imdur   - cont hydralazine  100 mg 3 times daily -As needed labetalol  - as needed hydralazine  - Continue to monitor and add more antihypertensives as needed   Type 2 diabetes mellitus with chronic kidney disease, with long-term current use of insulin  (HCC) CBG currently within goal. -Continue with Semglee  and SSI   Aspiration pneumonia (HCC) Patient was started on Unasyn  in ICU for concern of aspiration pneumonia. Finished Unasyn  7-day course on 11/24   COPD (chronic obstructive pulmonary disease) (HCC) -Continue with bronchodilators   Dysphagia Improving -Patient was started on dysphagia 1 diet with honey thick liquid after having barium swallow studies - Dietitian following -Swallow team following   Metabolic acidosis likely secondary to CKD. - Titrate p.o. bicarb tablets as needed -Continue to monitor   Hyperlipidemia - Continue with Lipitor  DVT prophylaxis: heparin  injection 5,000 Units Start: 08/12/24 1400 SCD's Start: 08/10/24 1915  SQ Heparin    Code Status: Full Code Disposition Plan: SNF Reason for continuing need for hospitalization: IV hydration, monitor electrolytes, placement pending, SW following  Objective: Vitals:   08/29/24 0445 08/29/24 0500 08/29/24 0800 08/29/24 1042  BP: 134/78  (!) 168/74 (!) 144/81  Pulse: (!) 106  97   Resp:  18   Temp: 98.1 F (36.7 C)  98.3 F  (36.8 C)   TempSrc: Oral  Oral   SpO2: 100%  98%   Weight:  82.9 kg    Height:        Intake/Output Summary (Last 24 hours) at 08/29/2024 1159 Last data filed at 08/28/2024 1659 Gross per 24 hour  Intake --  Output 250 ml  Net -250 ml   Filed Weights   08/27/24 0451 08/28/24 0500 08/29/24 0500  Weight: 82.3 kg 82.8 kg 82.9 kg    Examination:  Physical Exam Vitals and nursing note reviewed.  Constitutional:      General: He is not in acute distress.    Appearance: He is ill-appearing.     Comments: Somnolent, weak, frail  HENT:     Head: Normocephalic and atraumatic.  Cardiovascular:     Rate and Rhythm: Normal rate and regular rhythm.     Pulses: Normal pulses.     Heart sounds: Normal heart sounds.  Pulmonary:     Effort: Pulmonary effort is normal.     Breath sounds: Normal breath sounds.  Abdominal:     General: Bowel sounds are normal.     Palpations: Abdomen is soft.  Neurological:     Mental Status: He is alert.     Data Reviewed: I have personally reviewed following labs and imaging studies  CBC: Recent Labs  Lab 08/23/24 0147 08/24/24 0208 08/25/24 0105 08/29/24 0413  WBC 3.2* 2.9* 2.5* 4.4  HGB 10.7* 9.8* 9.0* 10.9*  HCT 31.7* 29.1* 28.1* 33.1*  MCV 93.5 92.1 95.6 95.7  PLT 103* 100* 97* 156   Basic Metabolic Panel: Recent Labs  Lab 08/23/24 0147 08/24/24 0208 08/25/24 0105 08/29/24 0413  NA 146* 146* 144 152*  K 5.1 4.7 4.9 4.3  CL 114* 117* 118* 119*  CO2 18* 19* 17* 20*  GLUCOSE 88 93 131* 128*  BUN 68* 59* 56* 58*  CREATININE 3.50* 3.45* 3.55* 4.31*  CALCIUM  9.0 9.0 8.6* 9.6  MG 2.2  --   --   --   PHOS 3.9 4.0 3.9  --    GFR: Estimated Creatinine Clearance: 14.7 mL/min (A) (by C-G formula based on SCr of 4.31 mg/dL (H)). Liver Function Tests: Recent Labs  Lab 08/24/24 0208 08/25/24 0105  ALBUMIN  2.6* 2.6*   No results for input(s): LIPASE, AMYLASE in the last 168 hours. No results for input(s): AMMONIA in the  last 168 hours. Coagulation Profile: No results for input(s): INR, PROTIME in the last 168 hours. Cardiac Enzymes: No results for input(s): CKTOTAL, CKMB, CKMBINDEX, TROPONINI in the last 168 hours. ProBNP, BNP (last 5 results) No results for input(s): PROBNP, BNP in the last 8760 hours. HbA1C: No results for input(s): HGBA1C in the last 72 hours. CBG: Recent Labs  Lab 08/28/24 1945 08/28/24 2356 08/29/24 0422 08/29/24 0818 08/29/24 1137  GLUCAP 189* 129* 121* 119* 197*   Lipid Profile: No results for input(s): CHOL, HDL, LDLCALC, TRIG, CHOLHDL, LDLDIRECT in the last 72 hours. Thyroid  Function Tests: No results for input(s): TSH, T4TOTAL, FREET4, T3FREE, THYROIDAB in the last 72 hours. Anemia Panel: No results for input(s): VITAMINB12, FOLATE, FERRITIN, TIBC, IRON, RETICCTPCT in the last 72 hours. Sepsis Labs: No results for input(s): PROCALCITON, LATICACIDVEN in the last 168 hours.  No results found for this or any previous visit (from the past 240 hours).   Radiology Studies: No results found.  Scheduled Meds:  allopurinol   50 mg Oral Daily  amLODipine   10 mg Oral Daily   atorvastatin   40 mg Oral Daily   heparin  injection (subcutaneous)  5,000 Units Subcutaneous Q8H   hydrALAZINE   100 mg Oral Q8H   insulin  aspart  0-20 Units Subcutaneous Q4H   insulin  glargine-yfgn  28 Units Subcutaneous Daily   isosorbide  mononitrate  120 mg Oral Daily   latanoprost   1 drop Both Eyes QHS   melatonin  3 mg Oral QHS   multivitamin with minerals  1 tablet Oral Daily   mouth rinse  15 mL Mouth Rinse 4 times per day   polyethylene glycol  17 g Oral Daily   QUEtiapine   25 mg Oral QHS   senna-docusate  1 tablet Oral BID   sertraline   50 mg Oral Daily   sodium bicarbonate   1,300 mg Oral BID   Continuous Infusions:  dextrose  75 mL/hr at 08/29/24 0806     LOS: 19 days   Norval Bar, MD  [facility]  08/29/2024,  11:59 AM

## 2024-08-30 LAB — BASIC METABOLIC PANEL WITH GFR
Anion gap: 14 (ref 5–15)
Anion gap: 10 (ref 5–15)
BUN: 60 mg/dL — ABNORMAL HIGH (ref 8–23)
BUN: 58 mg/dL — ABNORMAL HIGH (ref 8–23)
CO2: 20 mmol/L — ABNORMAL LOW (ref 22–32)
CO2: 23 mmol/L (ref 22–32)
Calcium: 9.3 mg/dL (ref 8.9–10.3)
Calcium: 9.1 mg/dL (ref 8.9–10.3)
Chloride: 116 mmol/L — ABNORMAL HIGH (ref 98–111)
Chloride: 116 mmol/L — ABNORMAL HIGH (ref 98–111)
Creatinine, Ser: 4.29 mg/dL — ABNORMAL HIGH (ref 0.61–1.24)
Creatinine, Ser: 4.38 mg/dL — ABNORMAL HIGH (ref 0.61–1.24)
GFR, Estimated: 14 mL/min — ABNORMAL LOW (ref >60)
GFR, Estimated: 13 mL/min — ABNORMAL LOW (ref >60)
Glucose, Bld: 131 mg/dL — ABNORMAL HIGH (ref 70–99)
Glucose, Bld: 120 mg/dL — ABNORMAL HIGH (ref 70–99)
Potassium: 3.9 mmol/L (ref 3.5–5.1)
Potassium: 3.9 mmol/L (ref 3.5–5.1)
Sodium: 150 mmol/L — ABNORMAL HIGH (ref 135–145)
Sodium: 149 mmol/L — ABNORMAL HIGH (ref 135–145)

## 2024-08-30 LAB — GLUCOSE, CAPILLARY
Glucose-Capillary: 93 mg/dL (ref 70–99)
Glucose-Capillary: 113 mg/dL — ABNORMAL HIGH (ref 70–99)
Glucose-Capillary: 326 mg/dL — ABNORMAL HIGH (ref 70–99)
Glucose-Capillary: 281 mg/dL — ABNORMAL HIGH (ref 70–99)
Glucose-Capillary: 284 mg/dL — ABNORMAL HIGH (ref 70–99)

## 2024-08-30 NOTE — Progress Notes (Signed)
 PROGRESS NOTE    Alec [name]  FMW:[id number] DOB: 1945/12/26 DOA: 08/10/2024 PCP: Loreli Kins, MD  Subjective:  No acute events overnight. Seen and examined at bedside with wife present. No new complaints. As per wife, tolerating oral intake well. Denies nausea, vomiting, constipation.   Hospital Course: [phone] with hx DM, HTN, CAD, CHF, RA, COPD, and chronic pancytopenia who initially presented on 11/11 with R sided weakness and aphasia, found to have L sided subcortical ICH suspected due to uncontrolled hypertension. Hospital course complicated with acute encephalopathy, dysphagia, aspiration pneumonia, acute respiratory failure requiring mechanical ventilation then extubated on 11/21, AKI on CKD 4, hypernatremia, and hyperkalemia.  Assessment and Plan:  Acute encephalopathy Likely in the setting of ICH  - waxing and waning course  - cont seroquel  at bedtime - discontinued haldol  IV PRN. Can consider adding as one-time dose if not redirectable but need to document such episodes in the chart to help with medication titrations/adjustements - cont melatonin at bedtime - cont seroquel  25mg  at bedtime - cont home sertraline  - monitor I/Os, Bms, and sleep routine - monitor clinically   Hypernatremia - recurrent in nature - concern of intermittent poor oral intake  - cont D5W - encourage oral hydration - monitor BMP   CKD (chronic kidney disease), stage IV (HCC) Patient developed AKI with underlying CKD stage IV during critical illness. Nephrology followed earlier, not a good candidate for long-term dialysis.  - Cr 4.38 < 4.31, baseline now around 4. - IV fluids as elsewhere - Encourage oral hydration - renal dose meds, avoid nephrotoxic agents - Monitor renal function - will need to follow up with nephrology outpatient   ICH (intracerebral hemorrhage) (HCC) Stable with midline shift, unlikely secondary to uncontrolled hypertension. -Continue holding antiplatelets. -  cont statin - PT recommend SNF   Acute respiratory failure with hypoxia (HCC) Patient developed acute hypoxic respiratory failure requiring intubation, extubated on 11/21 and now weaned back to room air. -Continue to monitor -Supplemental oxygen as needed   Hypertension Blood pressure better controlled Patient was on multiple antihypertensives at home with concern of being noncompliant. He was not on any antihypertensives when transferred out of ICU, likely some concern of hypotension with acute decompensation. -cont amlodipine  10 mg daily -cont home Imdur   - cont hydralazine  100 mg 3 times daily -As needed labetalol  - as needed hydralazine  - Continue to monitor and add more antihypertensives as needed   Type 2 diabetes mellitus with chronic kidney disease, with long-term current use of insulin  (HCC) CBG currently within goal. -Continue with Semglee  and SSI   Aspiration pneumonia (HCC) Patient was started on Unasyn  in ICU for concern of aspiration pneumonia. Finished Unasyn  7-day course on 11/24   COPD (chronic obstructive pulmonary disease) (HCC) -Continue with bronchodilators   Dysphagia Improving -Patient was started on dysphagia 1 diet with honey thick liquid after having barium swallow studies - Dietitian following -Swallow team following   Metabolic acidosis likely secondary to CKD. - Titrate p.o. bicarb tablets as needed -Continue to monitor   Hyperlipidemia - Continue with Lipitor  DVT prophylaxis: heparin  injection 5,000 Units Start: 08/12/24 1400 SCD's Start: 08/10/24 1915  SQ Heparin    Code Status: Full Code Family Communication: updated wife at bedside Disposition Plan: SNF Reason for continuing need for hospitalization: IV hydration, monitor Na, SNF bed availability, SW following  Objective: Vitals:   08/30/24 0333 08/30/24 0500 08/30/24 0712 08/30/24 1129  BP: (!) 149/69  (!) 166/59 (!) 142/55  Pulse: 74  66  90  Resp:   15 19  Temp: 97.9 F (36.6  C)  98.1 F (36.7 C) 97.7 F (36.5 C)  TempSrc: Oral  Oral Oral  SpO2: 97%  99% 96%  Weight:  80 kg    Height:        Intake/Output Summary (Last 24 hours) at 08/30/2024 1418 Last data filed at 08/30/2024 1040 Gross per 24 hour  Intake 1268.96 ml  Output 400 ml  Net 868.96 ml   Filed Weights   08/28/24 0500 08/29/24 0500 08/30/24 0500  Weight: 82.8 kg 82.9 kg 80 kg    Examination:  Physical Exam Vitals and nursing note reviewed.  Constitutional:      General: He is not in acute distress.    Appearance: He is ill-appearing.     Comments: Weak, frail  HENT:     Head: Normocephalic and atraumatic.  Cardiovascular:     Rate and Rhythm: Normal rate and regular rhythm.     Pulses: Normal pulses.     Heart sounds: Normal heart sounds.  Pulmonary:     Effort: Pulmonary effort is normal.     Breath sounds: Normal breath sounds.  Abdominal:     General: Bowel sounds are normal.     Palpations: Abdomen is soft.  Neurological:     Mental Status: He is alert. He is disoriented.     Data Reviewed: I have personally reviewed following labs and imaging studies  CBC: Recent Labs  Lab 08/24/24 0208 08/25/24 0105 08/29/24 0413  WBC 2.9* 2.5* 4.4  HGB 9.8* 9.0* 10.9*  HCT 29.1* 28.1* 33.1*  MCV 92.1 95.6 95.7  PLT 100* 97* 156   Basic Metabolic Panel: Recent Labs  Lab 08/24/24 0208 08/25/24 0105 08/29/24 0413 08/29/24 1408 08/30/24 0218 08/30/24 0809  NA 146* 144 152* 149* 150* 149*  K 4.7 4.9 4.3 4.2 3.9 3.9  CL 117* 118* 119* 118* 116* 116*  CO2 19* 17* 20* 20* 20* 23  GLUCOSE 93 131* 128* 256* 131* 120*  BUN 59* 56* 58* 62* 60* 58*  CREATININE 3.45* 3.55* 4.31* 4.46* 4.29* 4.38*  CALCIUM  9.0 8.6* 9.6 9.0 9.3 9.1  PHOS 4.0 3.9  --   --   --   --    GFR: Estimated Creatinine Clearance: 14.3 mL/min (A) (by C-G formula based on SCr of 4.38 mg/dL (H)). Liver Function Tests: Recent Labs  Lab 08/24/24 0208 08/25/24 0105  ALBUMIN  2.6* 2.6*   No results  for input(s): LIPASE, AMYLASE in the last 168 hours. No results for input(s): AMMONIA in the last 168 hours. Coagulation Profile: No results for input(s): INR, PROTIME in the last 168 hours. Cardiac Enzymes: No results for input(s): CKTOTAL, CKMB, CKMBINDEX, TROPONINI in the last 168 hours. ProBNP, BNP (last 5 results) No results for input(s): PROBNP, BNP in the last 8760 hours. HbA1C: No results for input(s): HGBA1C in the last 72 hours. CBG: Recent Labs  Lab 08/29/24 1935 08/29/24 2328 08/30/24 0338 08/30/24 0739 08/30/24 1146  GLUCAP 122* 159* 93 113* 326*   Lipid Profile: No results for input(s): CHOL, HDL, LDLCALC, TRIG, CHOLHDL, LDLDIRECT in the last 72 hours. Thyroid  Function Tests: No results for input(s): TSH, T4TOTAL, FREET4, T3FREE, THYROIDAB in the last 72 hours. Anemia Panel: No results for input(s): VITAMINB12, FOLATE, FERRITIN, TIBC, IRON, RETICCTPCT in the last 72 hours. Sepsis Labs: No results for input(s): PROCALCITON, LATICACIDVEN in the last 168 hours.  No results found for this or any previous visit (from the  past 240 hours).   Radiology Studies: No results found.  Scheduled Meds:  allopurinol   50 mg Oral Daily   amLODipine   10 mg Oral Daily   atorvastatin   40 mg Oral Daily   heparin  injection (subcutaneous)  5,000 Units Subcutaneous Q8H   hydrALAZINE   100 mg Oral Q8H   insulin  aspart  0-20 Units Subcutaneous Q4H   insulin  glargine-yfgn  28 Units Subcutaneous Daily   isosorbide  mononitrate  120 mg Oral Daily   latanoprost   1 drop Both Eyes QHS   melatonin  3 mg Oral QHS   multivitamin with minerals  1 tablet Oral Daily   mouth rinse  15 mL Mouth Rinse 4 times per day   polyethylene glycol  17 g Oral Daily   QUEtiapine   25 mg Oral QHS   senna-docusate  1 tablet Oral BID   sertraline   50 mg Oral Daily   sodium bicarbonate   1,300 mg Oral BID   Continuous Infusions:  dextrose  100 mL/hr  at 08/30/24 1000     LOS: 20 days   Norval Bar, MD  [facility]  08/30/2024, 2:18 PM

## 2024-08-30 NOTE — Plan of Care (Signed)
  Problem: Education: Goal: Knowledge of disease or condition will improve 08/30/2024 0630 by Curly Venetia RAMAN, RN Outcome: Progressing 08/30/2024 0630 by Curly Venetia RAMAN, RN Reactivated Goal: Knowledge of secondary prevention will improve (MUST DOCUMENT ALL) 08/30/2024 0630 by Curly Venetia RAMAN, RN Outcome: Progressing 08/30/2024 0630 by Curly Venetia RAMAN, RN Reactivated Goal: Knowledge of patient specific risk factors will improve (DELETE if not current risk factor) 08/30/2024 0630 by Curly Venetia RAMAN, RN Outcome: Progressing 08/30/2024 0630 by Curly Venetia RAMAN, RN Reactivated   Problem: Intracerebral Hemorrhage Tissue Perfusion: Goal: Complications of Intracerebral Hemorrhage will be minimized 08/30/2024 0630 by Curly Venetia RAMAN, RN Outcome: Progressing 08/30/2024 0521 by Curly Venetia RAMAN, RN Outcome: Progressing   Problem: Nutrition: Goal: Risk of aspiration will decrease Outcome: Progressing Goal: Dietary intake will improve Outcome: Progressing

## 2024-08-30 NOTE — Progress Notes (Signed)
 Physical Therapy Treatment Patient Details Name: Alec [name] MRN: [id number] DOB: [birth year] Today's Date: 08/30/2024   History of Present Illness 78 y.o. male adm 08/10/2024 with Rt weakness and aphasia. Pt with large Lt basal ganglia hemorrhage complicated by AKI and aspiration. Intubated 11/18-11/21. PMHx: DM, HTN, CAD s/p CABG, CHF, RA, COPD, CKD, bladder & prostate CA, anxiety, gout, HLD    PT Comments  Pt received in supine and agreeable to session. Pt demonstrates improved initiation this session, but continues to require grossly mod-max A +2 for bed mobility and transfers. Pt initially pushes R with LUE, but improves some after L elbow prop. Pt able to tolerate 3 standing trials, but is limited by weakness and fatigue with R knee flexed throughout. Pt able to transfer to the recliner with the stedy, but requires trunk support throughout due to R lateral lean. Pt continues to benefit from PT services to progress toward functional mobility goals.     If plan is discharge home, recommend the following: Two people to help with walking and/or transfers;Two people to help with bathing/dressing/bathroom;Assistance with feeding;Direct supervision/assist for medications management;Direct supervision/assist for financial management;Assist for transportation;Help with stairs or ramp for entrance;Supervision due to cognitive status   Can travel by private vehicle     No  Equipment Recommendations  Wheelchair (measurements PT);Wheelchair cushion (measurements PT);Hospital bed;Hoyer lift;BSC/3in1    Recommendations for Other Services       Precautions / Restrictions Precautions Precautions: Fall;Other (comment) Recall of Precautions/Restrictions: Impaired Precaution/Restrictions Comments: SBP <160; pushes to the R Restrictions Weight Bearing Restrictions Per Provider Order: No     Mobility  Bed Mobility Overal bed mobility: Needs Assistance Bed Mobility: Supine to Sit     Supine to  sit: Mod assist, +2 for physical assistance     General bed mobility comments: Pt able to advance BLE to EOB and reach to rail with LUE with guidance. Assist for trunk elevation and scooting forward to EOB    Transfers Overall transfer level: Needs assistance Equipment used: Ambulation equipment used (stedy) Transfers: Sit to/from Stand, Bed to chair/wheelchair/BSC Sit to Stand: Mod assist, +2 physical assistance, +2 safety/equipment, From elevated surface, Max assist           General transfer comment: From EOB and stedy pads with mod A +2 and from recliner with max A +2 due to lower surface. Good initiation, but limited due to weakness R>L    Ambulation/Gait               General Gait Details: unable at this time   Stairs             Wheelchair Mobility     Tilt Bed    Modified Rankin (Stroke Patients Only) Modified Rankin (Stroke Patients Only) Pre-Morbid Rankin Score: No symptoms Modified Rankin: Severe disability     Balance Overall balance assessment: Needs assistance Sitting-balance support: Single extremity supported, Feet supported Sitting balance-Leahy Scale: Poor Sitting balance - Comments: Pushes R requiring mod A to maintain balance. Improved to min A after L elbow prop.   Standing balance support: Bilateral upper extremity supported, During functional activity Standing balance-Leahy Scale: Zero Standing balance comment: reliant on stedy and external support                            Communication Communication Communication: Impaired Factors Affecting Communication: Difficulty expressing self;Reduced clarity of speech  Cognition Arousal: Alert Behavior During Therapy: Flat affect  PT - Cognitive impairments: Awareness, Safety/Judgement, Problem solving, Initiation, Attention                         Following commands: Impaired Following commands impaired: Follows one step commands inconsistently, Follows one  step commands with increased time    Cueing Cueing Techniques: Verbal cues, Tactile cues, Gestural cues, Visual cues  Exercises      General Comments        Pertinent Vitals/Pain Pain Assessment Pain Assessment: Faces Faces Pain Scale: Hurts a little bit Pain Location: Appears to indicate LLE, but unable to specify clearly Pain Descriptors / Indicators: Discomfort, Grimacing, Guarding Pain Intervention(s): Monitored during session     PT Goals (current goals can now be found in the care plan section) Acute Rehab PT Goals Patient Stated Goal: Unable to state PT Goal Formulation: Patient unable to participate in goal setting Time For Goal Achievement: 09/03/24 Progress towards PT goals: Progressing toward goals    Frequency    Min 2X/week       AM-PAC PT 6 Clicks Mobility   Outcome Measure  Help needed turning from your back to your side while in a flat bed without using bedrails?: A Lot Help needed moving from lying on your back to sitting on the side of a flat bed without using bedrails?: A Lot Help needed moving to and from a bed to a chair (including a wheelchair)?: Total Help needed standing up from a chair using your arms (e.g., wheelchair or bedside chair)?: Total Help needed to walk in hospital room?: Total Help needed climbing 3-5 steps with a railing? : Total 6 Click Score: 8    End of Session Equipment Utilized During Treatment: Gait belt Activity Tolerance: Patient tolerated treatment well;Patient limited by fatigue Patient left: in chair;with chair alarm set;with call bell/phone within reach Nurse Communication: Mobility status;Need for lift equipment PT Visit Diagnosis: Hemiplegia and hemiparesis;Unsteadiness on feet (R26.81);Other abnormalities of gait and mobility (R26.89);Muscle weakness (generalized) (M62.81);Difficulty in walking, not elsewhere classified (R26.2);Other symptoms and signs involving the nervous system (R29.898) Hemiplegia -  Right/Left: Right Hemiplegia - dominant/non-dominant: Dominant Hemiplegia - caused by: Nontraumatic intracerebral hemorrhage     Time: 8492-8461 PT Time Calculation (min) (ACUTE ONLY): 31 min  Charges:    $Therapeutic Activity: 23-37 mins PT General Charges $$ ACUTE PT VISIT: 1 Visit                    Darryle George, PTA Acute Rehabilitation Services Secure Chat Preferred  [phone]    Darryle George 08/30/2024, 3:50 PM

## 2024-08-30 NOTE — Plan of Care (Signed)
  Problem: Education: Goal: Knowledge of disease or condition will improve Outcome: Not Progressing Goal: Knowledge of secondary prevention will improve (MUST DOCUMENT ALL) Outcome: Not Progressing Goal: Knowledge of patient specific risk factors will improve (DELETE if not current risk factor) Outcome: Not Progressing

## 2024-08-30 NOTE — Progress Notes (Signed)
 Nutrition Follow-up  DOCUMENTATION CODES:  Not applicable  INTERVENTION:  Continue current diet as ordered per SLP automatic trays from dining services Magic cup TID with meals, each supplement provides 290 kcal and 9 grams of protein Add additional drinks onto meal trays to encourage adequate hydration  NUTRITION DIAGNOSIS:  Swallowing difficulty related to  (new ICH) as evidenced by  (modified diet). - Ongoing.   GOAL:  Patient will meet greater than or equal to 90% of their needs - progressing  MONITOR:  PO intake, Supplement acceptance, Labs  REASON FOR ASSESSMENT:  Consult Enteral/tube feeding initiation and management  ASSESSMENT:  Patient presented with R-sided weakness and aphasia and was found to have large subcortical hemorrhage. PMH significant for DM2, CHF, HTN, CAD s/p CABG 2019, dyslipidemia, CKD4, COPD, prostate and bladder cancer.  11/11 - admit for L subcortical ICH 11/12 - s/p cortrak placement;  11/18 - pt obtunded and intubated; TF held; Renal consulted for AKI on CKD stage IV started on IV fluids and fluid via Cortrak tube  11/23- pt removed Cortrak 11/24- MBS, DYS 1 w/ honey thick   Pt resting in bed at the time of assessment. Does not participate in exam but wife at bedside able to provide information on intake over the last few days. Breakfast at bedside able to be observed. Pt had eaten most of his magic cup, his pudding, and his cranberry juice. On IVF at 100mL/h. Wife worried about kidney function and hydration status. Discussed that pt will need to attempt to take in more PO fluids and that the more he can practice swallowing and following his swallowing regimen from SLP the better his chances of being able to eventually upgrade diet. Noted plan for repeat instrumental swallow study this week. Will add extra drinks to meal tray to provide additional options.  Average Meal Intake: 11/27-11/30: 57% average intake x 5 recorded  meals  Medications: Scheduled Meds:  atorvastatin   40 mg Oral Daily   insulin  aspart  0-20 Units Subcutaneous Q4H   insulin  glargine-yfgn  28 Units Subcutaneous Daily   multivitamin with minerals  1 tablet Oral Daily   polyethylene glycol  17 g Oral Daily   senna-docusate  1 tablet Oral BID   sodium bicarbonate   1,300 mg Oral BID   Continuous Infusions:  dextrose  100 mL/hr at 08/30/24 0906   Labs reviewed:  Sodium 149, Chloride 116 BUN 58, Creatinine 4.38 HgbA1C 6.0% (08/12/24) CBGs: 93-197 mg/dL over the last 24 hours  Diet Order:   Diet Order             DIET - DYS 1 Fluid consistency: Honey Thick  Diet effective now                  EDUCATION NEEDS:  Not appropriate for education at this time  Skin:  Skin Assessment: Reviewed RN Assessment  Last BM:  11/28  Height:  Ht Readings from Last 1 Encounters:  08/17/24 5' 6 (1.676 m)    Weight:  Wt Readings from Last 1 Encounters:  08/30/24 80 kg    Ideal Body Weight:  65 kg  BMI:  Body mass index is 28.47 kg/m.  Estimated Nutritional Needs:  Kcal:  1700-1900 kcal Protein:  85-100g/d Fluid:  >1.9L/d    Vernell Lukes, RD, LDN, CNSC Registered Dietitian II Please reach out via secure chat

## 2024-08-30 NOTE — Plan of Care (Signed)
  Problem: Intracerebral Hemorrhage Tissue Perfusion: Goal: Complications of Intracerebral Hemorrhage will be minimized Outcome: Progressing   Problem: Nutrition: Goal: Risk of aspiration will decrease Outcome: Progressing Goal: Dietary intake will improve Outcome: Progressing

## 2024-08-30 NOTE — TOC Progression Note (Signed)
 Transition of Care [facility]) - Progression Note    Patient Details  Name: Alec [name] MRN: [id number] Date of Birth: Dec 08, 1945  [facility] CM/SW Contact  [name], [place] Phone Number: 08/30/2024, 10:02 AM  Clinical Narrative:   CSW following for disposition. CSW sent update to [facility], they are reviewing for possible bed offer. CSW to follow.  UPDATE: CSW received update that [facility] can offer bed, when patient is stable. Workup ongoing at this time. CSW to follow.    Expected Discharge Plan: Skilled Nursing Facility Barriers to Discharge: Continued Medical Work up               Expected Discharge Plan and Services                                               Social Drivers of Health (SDOH) Interventions SDOH Screenings   Food Insecurity: Patient Unable To Answer (08/13/2024)  Housing: Patient Unable To Answer (08/13/2024)  Transportation Needs: Patient Unable To Answer (08/13/2024)  Utilities: Patient Unable To Answer (08/13/2024)  Depression (PHQ2-9): Low Risk  (02/27/2023)  Social Connections: Patient Unable To Answer (08/13/2024)  Tobacco Use: Medium Risk (08/10/2024)    Readmission Risk Interventions    03/17/2023    3:00 PM 03/29/2022    2:24 PM  Readmission Risk Prevention Plan  Transportation Screening Complete Complete  PCP or Specialist Appt within 5-7 Days  Complete  PCP or Specialist Appt within 3-5 Days Complete   Home Care Screening  Complete  Medication Review (RN CM)  Complete  HRI or Home Care Consult Complete   Palliative Care Screening Not Applicable   Medication Review (RN Care Manager) Complete

## 2024-08-31 ENCOUNTER — Inpatient Hospital Stay (HOSPITAL_COMMUNITY)

## 2024-08-31 LAB — BASIC METABOLIC PANEL WITH GFR
Anion gap: 9 (ref 5–15)
Anion gap: 11 (ref 5–15)
Anion gap: 9 (ref 5–15)
BUN: 60 mg/dL — ABNORMAL HIGH (ref 8–23)
BUN: 63 mg/dL — ABNORMAL HIGH (ref 8–23)
BUN: 58 mg/dL — ABNORMAL HIGH (ref 8–23)
CO2: 24 mmol/L (ref 22–32)
CO2: 22 mmol/L (ref 22–32)
CO2: 25 mmol/L (ref 22–32)
Calcium: 8.7 mg/dL — ABNORMAL LOW (ref 8.9–10.3)
Calcium: 8.8 mg/dL — ABNORMAL LOW (ref 8.9–10.3)
Calcium: 8.7 mg/dL — ABNORMAL LOW (ref 8.9–10.3)
Chloride: 112 mmol/L — ABNORMAL HIGH (ref 98–111)
Chloride: 109 mmol/L (ref 98–111)
Chloride: 106 mmol/L (ref 98–111)
Creatinine, Ser: 4.4 mg/dL — ABNORMAL HIGH (ref 0.61–1.24)
Creatinine, Ser: 4.3 mg/dL — ABNORMAL HIGH (ref 0.61–1.24)
Creatinine, Ser: 4.16 mg/dL — ABNORMAL HIGH (ref 0.61–1.24)
GFR, Estimated: 13 mL/min — ABNORMAL LOW (ref >60)
GFR, Estimated: 14 mL/min — ABNORMAL LOW (ref >60)
GFR, Estimated: 14 mL/min — ABNORMAL LOW (ref >60)
Glucose, Bld: 104 mg/dL — ABNORMAL HIGH (ref 70–99)
Glucose, Bld: 247 mg/dL — ABNORMAL HIGH (ref 70–99)
Glucose, Bld: 196 mg/dL — ABNORMAL HIGH (ref 70–99)
Potassium: 3.7 mmol/L (ref 3.5–5.1)
Potassium: 3.7 mmol/L (ref 3.5–5.1)
Potassium: 3.6 mmol/L (ref 3.5–5.1)
Sodium: 145 mmol/L (ref 135–145)
Sodium: 142 mmol/L (ref 135–145)
Sodium: 140 mmol/L (ref 135–145)

## 2024-08-31 LAB — GLUCOSE, CAPILLARY
Glucose-Capillary: 130 mg/dL — ABNORMAL HIGH (ref 70–99)
Glucose-Capillary: 169 mg/dL — ABNORMAL HIGH (ref 70–99)
Glucose-Capillary: 196 mg/dL — ABNORMAL HIGH (ref 70–99)
Glucose-Capillary: 247 mg/dL — ABNORMAL HIGH (ref 70–99)
Glucose-Capillary: 252 mg/dL — ABNORMAL HIGH (ref 70–99)
Glucose-Capillary: 99 mg/dL (ref 70–99)

## 2024-08-31 MED ORDER — PANTOPRAZOLE SODIUM 40 MG IV SOLR
40.0000 mg | INTRAVENOUS | Status: DC
  Administered 2024-08-31 – 2024-09-02 (×3): 40 mg via INTRAVENOUS
  Filled 2024-08-31 (×3): qty 10

## 2024-08-31 MED ORDER — INSULIN ASPART 100 UNIT/ML IJ SOLN
0.0000 [IU] | Freq: Every day | INTRAMUSCULAR | Status: DC
  Administered 2024-09-01: 2 [IU] via SUBCUTANEOUS
  Filled 2024-08-31: qty 1
  Filled 2024-08-31: qty 5

## 2024-08-31 MED ORDER — INSULIN ASPART 100 UNIT/ML IJ SOLN
0.0000 [IU] | Freq: Three times a day (TID) | INTRAMUSCULAR | Status: DC
  Administered 2024-08-31: 8 [IU] via SUBCUTANEOUS
  Administered 2024-08-31: 5 [IU] via SUBCUTANEOUS
  Administered 2024-09-01 (×2): 3 [IU] via SUBCUTANEOUS
  Administered 2024-09-01: 5 [IU] via SUBCUTANEOUS
  Administered 2024-09-02: 8 [IU] via SUBCUTANEOUS
  Administered 2024-09-02 – 2024-09-03 (×3): 5 [IU] via SUBCUTANEOUS
  Administered 2024-09-04: 3 [IU] via SUBCUTANEOUS
  Filled 2024-08-31 (×3): qty 5
  Filled 2024-08-31: qty 3
  Filled 2024-08-31: qty 8
  Filled 2024-08-31: qty 3
  Filled 2024-08-31: qty 5
  Filled 2024-08-31: qty 4
  Filled 2024-08-31: qty 6
  Filled 2024-08-31: qty 8
  Filled 2024-08-31: qty 3
  Filled 2024-08-31: qty 8

## 2024-08-31 MED ORDER — ALUM & MAG HYDROXIDE-SIMETH 200-200-20 MG/5ML PO SUSP: 15.0000 mL | Freq: Four times a day (QID) | ORAL | Status: DC | PRN

## 2024-08-31 NOTE — Progress Notes (Signed)
 Speech Language Pathology Treatment: Dysphagia  Patient Details Name: Alec [name] MRN: [id number] DOB: Apr 29, 1946 Today's Date: 08/31/2024 Time: 8979-8955 SLP Time Calculation (min) (ACUTE ONLY): 24 min  Assessment / Plan / Recommendation Clinical Impression  Pt seen to address his swallowing today including implementing cued cough/throat clear to help protect airway. Wife, Lonell, present and reports his intake has remained poor. SLP instructed pt to conduct cough/throat clear - which are both weak despite best effort- due to his BG CVA.   Pt observed with PO including thin gingerale, honey thick gingerale, and pudding with medications given by RN. His swallow remains delayed with occasional cough noted toward end of snack with honey thick via cup with pt self feeding and tsp of thin.   Of note, pt SILENTLY aspirated nectar on MBS. He is impulsive and benefits from ongoing full supervision to control bolus amounts. Wife present and all agreeable to repeat MBS to determine appropriateness for dietary advancement.     HPI HPI: Alec [name]) is a 78 y.o. male who presented 11/11 with right-sided weakness and aphasia. Head CT with large L hemorrage. CT 11/12: Interval increase in size of left basal ganglia hemorrhage, now measuring 3.2 x 3.6 x 2.9 cm (estimated volume 17 mL, previously 11 mL). Mild increase in surrounding edema without significant midline shift. Pt became obtunded on 11/18 and was intubated and transferred to ICU. Pt with hx of diabetes, hypertension, CAD, CHF, RA, COPD.  Patient underwent MBS on 1124 and was advised to have pure and honey thick liquid via teaspoon.  SlP seen for follow up for dysphagia and dysarthria, cognitive management.      SLP Plan  Continue with current plan of care;MBS        Swallow Evaluation Recommendations         Recommendations  Diet recommendations: Dysphagia 1 (puree);Honey-thick liquid;Thin liquid (tsps of thin  ok) Liquids provided via: Cup;Teaspoon Medication Administration: Crushed with puree Supervision: Full supervision/cueing for compensatory strategies Compensations: Slow rate;Small sips/bites Postural Changes and/or Swallow Maneuvers: Seated upright 90 degrees;Out of bed for meals                  Oral care before and after PO   Frequent or constant Supervision/Assistance Dysphagia, oropharyngeal phase (R13.12)     Continue with current plan of care;MBS     Alec [name]  08/31/2024, 11:07 AM

## 2024-08-31 NOTE — Progress Notes (Addendum)
 PROGRESS NOTE    Alec [name]  FMW:[id number] DOB: 12/15/1945 DOA: 08/10/2024 PCP: Loreli Kins, MD  Subjective:  No acute events overnight. Seen and examined at bedside with wife present. Reports feeling better with no new complaints. As per wife, oral intake/hydration still low. Denies nausea, vomiting, constipation.   Hospital Course: [phone] with hx DM, HTN, CAD, CHF, RA, COPD, and chronic pancytopenia who initially presented on 11/11 with R sided weakness and aphasia, found to have L sided subcortical ICH suspected due to uncontrolled hypertension. Hospital course complicated with acute encephalopathy, dysphagia on modified diet as per SLP recommendations, aspiration pneumonia treated with antibiotic course, acute respiratory failure requiring mechanical ventilation then extubated on 11/21, AKI on CKD 4 now at new baseline, hypernatremia that has been recurrent in nature due to poor oral hydration resolved afternoon 12/2 with IV hydration, and hyperkalemia resolved with improvement in renal function. Need to encourage oral intake and monitor Na for stability.    Assessment and Plan: Acute encephalopathy Likely in the setting of ICH  - waxing and waning course  - cont seroquel  at bedtime - discontinued haldol  IV PRN. Can consider adding as one-time dose if not redirectable but need to document such episodes in the chart to help with medication titrations/adjustements - cont melatonin at bedtime - cont seroquel  25mg  at bedtime - cont home sertraline  - monitor I/Os, Bms, and sleep routine - monitor clinically   Hypernatremia - recurrent this admission - concern of intermittent poor oral intake  - improving - Na 142 < 149 - stop D5W   - encourage oral hydration - monitor BMP   CKD (chronic kidney disease), stage IV (HCC) Patient developed AKI with underlying CKD stage IV during critical illness. Nephrology followed earlier, not a good candidate for long-term dialysis.  - Cr  4.3 < 4.38, baseline now around 4. - Encourage oral hydration - renal dose meds, avoid nephrotoxic agents - Monitor renal function - will need to follow up with nephrology outpatient   ICH (intracerebral hemorrhage) (HCC) Stable with midline shift, unlikely secondary to uncontrolled hypertension. -Continue holding antiplatelets. - cont statin - PT recommend SNF   Acute respiratory failure with hypoxia (HCC) Patient developed acute hypoxic respiratory failure requiring intubation, extubated on 11/21 and now weaned back to room air. -Continue to monitor -Supplemental oxygen as needed   Hypertension Blood pressure better controlled Patient was on multiple antihypertensives at home with concern of being noncompliant. He was not on any antihypertensives when transferred out of ICU, likely some concern of hypotension with acute decompensation. -cont amlodipine  10 mg daily -cont home Imdur   - cont hydralazine  100 mg 3 times daily -As needed labetalol  - as needed hydralazine  - Continue to monitor and add more antihypertensives as needed   Type 2 diabetes mellitus with chronic kidney disease, with long-term current use of insulin  (HCC) CBG currently within goal. - Continue with Semglee   - switch SSI q6h to ACHS - adjust insulin  as needed   Aspiration pneumonia (HCC) Patient was started on Unasyn  in ICU for concern of aspiration pneumonia. Finished Unasyn  7-day course on 11/24   COPD (chronic obstructive pulmonary disease) (HCC) -Continue with bronchodilators   Dysphagia Improving - cont on dysphagia 1 diet with honey thick liquid  - Dietitian following - Swallow team following  Dyspepsia - postprantial belching and heartburn - start pantoprazole  trial - start maalox PRN   Metabolic acidosis likely secondary to CKD. - Titrate p.o. bicarb tablets as needed -Continue to monitor  Hyperlipidemia - Continue with Lipitor  DVT prophylaxis: heparin  injection 5,000 Units  Start: 08/12/24 1400 SCD's Start: 08/10/24 1915  SQ Heparin    Code Status: Full Code Family Communication: updated wife at bedside Disposition Plan: SNF Reason for continuing need for hospitalization: monitor Na, SNF bed availability, SW following   Objective: Vitals:   08/31/24 0038 08/31/24 0355 08/31/24 0500 08/31/24 0727  BP: (!) 156/68 (!) 148/60  (!) 157/60  Pulse: 79 70  62  Resp: 18 16  19   Temp: 98.2 F (36.8 C) 98 F (36.7 C)  97.7 F (36.5 C)  TempSrc:    Oral  SpO2: 97% 96%  99%  Weight:   85 kg   Height:        Intake/Output Summary (Last 24 hours) at 08/31/2024 0945 Last data filed at 08/31/2024 0800 Gross per 24 hour  Intake 3758.92 ml  Output 1270 ml  Net 2488.92 ml   Filed Weights   08/29/24 0500 08/30/24 0500 08/31/24 0500  Weight: 82.9 kg 80 kg 85 kg    Examination:  Physical Exam Vitals and nursing note reviewed.  Constitutional:      General: He is not in acute distress.    Appearance: He is ill-appearing.     Comments: Weak, frail  HENT:     Head: Normocephalic and atraumatic.  Cardiovascular:     Rate and Rhythm: Normal rate and regular rhythm.     Pulses: Normal pulses.     Heart sounds: Normal heart sounds.  Pulmonary:     Effort: Pulmonary effort is normal.     Breath sounds: Normal breath sounds.  Abdominal:     General: Bowel sounds are normal.     Palpations: Abdomen is soft.  Neurological:     Mental Status: He is alert.     Data Reviewed: I have personally reviewed following labs and imaging studies  CBC: Recent Labs  Lab 08/25/24 0105 08/29/24 0413  WBC 2.5* 4.4  HGB 9.0* 10.9*  HCT 28.1* 33.1*  MCV 95.6 95.7  PLT 97* 156   Basic Metabolic Panel: Recent Labs  Lab 08/25/24 0105 08/29/24 0413 08/29/24 1408 08/30/24 0218 08/30/24 0809 08/31/24 0524  NA 144 152* 149* 150* 149* 145  K 4.9 4.3 4.2 3.9 3.9 3.7  CL 118* 119* 118* 116* 116* 112*  CO2 17* 20* 20* 20* 23 24  GLUCOSE 131* 128* 256* 131* 120*  104*  BUN 56* 58* 62* 60* 58* 60*  CREATININE 3.55* 4.31* 4.46* 4.29* 4.38* 4.40*  CALCIUM  8.6* 9.6 9.0 9.3 9.1 8.7*  PHOS 3.9  --   --   --   --   --    GFR: Estimated Creatinine Clearance: 14.6 mL/min (A) (by C-G formula based on SCr of 4.4 mg/dL (H)). Liver Function Tests: Recent Labs  Lab 08/25/24 0105  ALBUMIN  2.6*   No results for input(s): LIPASE, AMYLASE in the last 168 hours. No results for input(s): AMMONIA in the last 168 hours. Coagulation Profile: No results for input(s): INR, PROTIME in the last 168 hours. Cardiac Enzymes: No results for input(s): CKTOTAL, CKMB, CKMBINDEX, TROPONINI in the last 168 hours. ProBNP, BNP (last 5 results) No results for input(s): PROBNP, BNP in the last 8760 hours. HbA1C: No results for input(s): HGBA1C in the last 72 hours. CBG: Recent Labs  Lab 08/30/24 1628 08/30/24 1952 08/31/24 0040 08/31/24 0356 08/31/24 0753  GLUCAP 281* 284* 196* 99 130*   Lipid Profile: No results for input(s): CHOL, HDL, LDLCALC,  TRIG, CHOLHDL, LDLDIRECT in the last 72 hours. Thyroid  Function Tests: No results for input(s): TSH, T4TOTAL, FREET4, T3FREE, THYROIDAB in the last 72 hours. Anemia Panel: No results for input(s): VITAMINB12, FOLATE, FERRITIN, TIBC, IRON, RETICCTPCT in the last 72 hours. Sepsis Labs: No results for input(s): PROCALCITON, LATICACIDVEN in the last 168 hours.  No results found for this or any previous visit (from the past 240 hours).   Radiology Studies: No results found.  Scheduled Meds:  allopurinol   50 mg Oral Daily   amLODipine   10 mg Oral Daily   atorvastatin   40 mg Oral Daily   heparin  injection (subcutaneous)  5,000 Units Subcutaneous Q8H   hydrALAZINE   100 mg Oral Q8H   insulin  aspart  0-15 Units Subcutaneous TID WC   insulin  aspart  0-5 Units Subcutaneous QHS   insulin  glargine-yfgn  28 Units Subcutaneous Daily   isosorbide  mononitrate  120 mg Oral  Daily   latanoprost   1 drop Both Eyes QHS   melatonin  3 mg Oral QHS   multivitamin with minerals  1 tablet Oral Daily   mouth rinse  15 mL Mouth Rinse 4 times per day   polyethylene glycol  17 g Oral Daily   QUEtiapine   25 mg Oral QHS   senna-docusate  1 tablet Oral BID   sertraline   50 mg Oral Daily   sodium bicarbonate   1,300 mg Oral BID   Continuous Infusions:  dextrose  100 mL/hr at 08/31/24 0800     LOS: 21 days   Norval Bar, MD  [facility]  08/31/2024, 9:45 AM

## 2024-08-31 NOTE — Progress Notes (Addendum)
 Speech Language Pathology Treatment: Dysphagia Cog Linguistic Patient Details Name: Alec [name] MRN: [id number] DOB: 13-Feb-1946 Today's Date: 08/31/2024 Time: 9053-8994 SLP Time Calculation (min) (ACUTE ONLY): 19 min  Assessment / Plan / Recommendation Clinical Impression  Pt's seen today for dysphagia management. He is alert and participative, his PO intake has remained VERY POOR.  Pt is dysarthric with very poor speech intelligibility.  More effective communication noted with providing pt with verbal choice of two.    Pt with lingual coating that appears consistent with hairy tongue - denies pain with swallowing. Extensive oral care provided using toothbrush/paste, toothette, oral suction and toothette/kit - Removed small amount of oral coating- but pt admitted to starting to have some discomfort - concern for causing abrasion.   Messaged MD picture of pt's tongue to assure this was not oral candidiasis -  Reviewed with RN and family importance of oral care.   Posted sign in room to have pt conduct dental brushing am and pm and oral suction after meals.    Concern for pt's swallow prognosis given his ongoing dysphagia with gross weakness and dysarthria impacting his PO intake - causing SLP to raise concerns that he may benefit from consideration for longer term alternative means of nutrition.  Spoke to RN about above concerns.         HPI HPI: Alec [name] Alec [name]) is a 78 y.o. male who presented 11/11 with right-sided weakness and aphasia. Head CT with large L hemorrage. CT 11/12: Interval increase in size of left basal ganglia hemorrhage, now measuring 3.2 x 3.6 x 2.9 cm (estimated volume 17 mL, previously 11 mL). Mild increase in surrounding edema without significant midline shift. Pt became obtunded on 11/18 and was intubated and transferred to ICU. Pt with hx of diabetes, hypertension, CAD, CHF, RA, COPD.  Patient underwent MBS on 1124 and was advised to have pure and honey thick  liquid via teaspoon.  SlP seen for follow up for dysphagia and dysarthria, cognitive management.      SLP Plan  Continue with current plan of care;MBS        Swallow Evaluation Recommendations    MBS!      Recommendations  Diet recommendations: Dysphagia 1 (puree);Honey-thick liquid, thin liquid via TSP OK Liquids provided via: Cup;Teaspoon Medication Administration: Crushed with puree Supervision: Full supervision/cueing for compensatory strategies Compensations: Slow rate;Small sips/bites;Other (Comment) (check for oral pocketing, observe swallow prior to giving more po)                  Oral care before and after PO   Frequent or constant Supervision/Assistance Dysphagia, oropharyngeal phase (R13.12)     Continue with current plan of care;MBS   Alec POUR, MS [facility] SLP Acute Rehab Services Office [phone]   Alec [name]  08/31/2024, 11:00 AM

## 2024-08-31 NOTE — Progress Notes (Signed)
 Occupational Therapy Treatment Patient Details Name: Alec [name] MRN: [id number] DOB: September 25, 1946 Today's Date: 08/31/2024   History of present illness 78 y.o. male adm 08/10/2024 with Rt weakness and aphasia. Pt with large Lt basal ganglia hemorrhage complicated by AKI and aspiration. Intubated 11/18-11/21. PMHx: DM, HTN, CAD s/p CABG, CHF, RA, COPD, CKD, bladder & prostate CA, anxiety, gout, HLD   OT comments  Patient received in supine, lethargic but willing to participate. Patient attempting to assist with getting to EOB with moving BLE towards EOB and required max assist to complete. Patient requiring mod assist sitting on EOB due to pushing towards right with bouts of min assist with patient using LUE to hold onto foot of bed. Patient performed reaching tasks and lateral leans while on EOB but became more lethargic and was assisted back to supine.  Patient will benefit from continued inpatient follow up therapy, <3 hours/day.  Acute OT to continue to follow to address established goals to facilitate DC to next venue of care.        If plan is discharge home, recommend the following:  Two people to help with walking and/or transfers;A lot of help with walking and/or transfers;A lot of help with bathing/dressing/bathroom;Two people to help with bathing/dressing/bathroom;Assistance with cooking/housework;Direct supervision/assist for medications management;Direct supervision/assist for financial management;Assist for transportation;Help with stairs or ramp for entrance   Equipment Recommendations  Other (comment) (defer)    Recommendations for Other Services      Precautions / Restrictions Precautions Precautions: Fall;Other (comment) Recall of Precautions/Restrictions: Impaired Precaution/Restrictions Comments: SBP <160; pushes to the R Restrictions Weight Bearing Restrictions Per Provider Order: No       Mobility Bed Mobility Overal bed mobility: Needs Assistance Bed Mobility:  Supine to Sit, Sit to Supine     Supine to sit: Max assist, HOB elevated Sit to supine: Max assist   General bed mobility comments: patient assisting with attempting to move BLE towards EOB and required max assist to complete and assistance with trunk.    Transfers Overall transfer level: Needs assistance Equipment used: None Transfers: Sit to/from Stand Sit to Stand: Total assist           General transfer comment: sit to stand from EOB to allow patient to get closer to [facility]     Balance Overall balance assessment: Needs assistance Sitting-balance support: Single extremity supported, Feet supported Sitting balance-Leahy Scale: Poor Sitting balance - Comments: pushes with LUE and required mod assist with bouts of min assist with patient using LUE to hold onto foot of bed Postural control: Posterior lean, Right lateral lean Standing balance support: Bilateral upper extremity supported, During functional activity Standing balance-Leahy Scale: Zero Standing balance comment: reliant on therapist to stand                           ADL either performed or assessed with clinical judgement   ADL Overall ADL's : Needs assistance/impaired     Grooming: Wash/dry face;Cueing for sequencing;Sitting                                 General ADL Comments: focused on sitting balance on EOB    Extremity/Trunk Assessment Upper Extremity Assessment RUE Deficits / Details: flaccid RUE Sensation: decreased proprioception RUE Coordination: decreased fine motor;decreased gross motor            Vision  Perception     Praxis     Communication Communication Communication: Impaired Factors Affecting Communication: Difficulty expressing self;Reduced clarity of speech   Cognition Arousal: Lethargic Behavior During Therapy: Flat affect Cognition: Difficult to assess Difficult to assess due to: Impaired communication           OT - Cognition  Comments: lethargic initially but became more alert once on EOB, patient became increasingly lethargic at end of session                 Following commands: Impaired Following commands impaired: Follows one step commands inconsistently, Follows one step commands with increased time      Cueing   Cueing Techniques: Verbal cues, Tactile cues, Gestural cues, Visual cues  Exercises      Shoulder Instructions       General Comments      Pertinent Vitals/ Pain       Pain Assessment Pain Assessment: No/denies pain  Home Living                                          Prior Functioning/Environment              Frequency  Min 2X/week        Progress Toward Goals  OT Goals(current goals can now be found in the care plan section)  Progress towards OT goals: Progressing toward goals  Acute Rehab OT Goals Patient Stated Goal: unable to state OT Goal Formulation: Patient unable to participate in goal setting Time For Goal Achievement: 09/07/24 Potential to Achieve Goals: Fair ADL Goals Pt Will Perform Grooming: with min assist;sitting Pt Will Perform Upper Body Dressing: with min assist;sitting Pt Will Perform Lower Body Dressing: with max assist;sit to/from stand Pt Will Transfer to Toilet: with max assist;stand pivot transfer;bedside commode Pt/caregiver will Perform Home Exercise Program: Increased ROM;Increased strength;Right Upper extremity;With minimal assist Additional ADL Goal #1: Pt will complete bed mobility with mod A as a precursor to ADLs  Plan      Co-evaluation                 AM-PAC OT 6 Clicks Daily Activity     Outcome Measure   Help from another person eating meals?: A Lot Help from another person taking care of personal grooming?: A Little Help from another person toileting, which includes using toliet, bedpan, or urinal?: Total Help from another person bathing (including washing, rinsing, drying)?: A Lot Help  from another person to put on and taking off regular upper body clothing?: A Lot Help from another person to put on and taking off regular lower body clothing?: Total 6 Click Score: 11    End of Session    OT Visit Diagnosis: Unsteadiness on feet (R26.81);Other abnormalities of gait and mobility (R26.89);Muscle weakness (generalized) (M62.81);Hemiplegia and hemiparesis Hemiplegia - Right/Left: Right Hemiplegia - dominant/non-dominant: Dominant Hemiplegia - caused by: Other Nontraumatic intracranial hemorrhage   Activity Tolerance Patient tolerated treatment well   Patient Left in bed;with call bell/phone within reach;with bed alarm set;with family/visitor present   Nurse Communication Mobility status        Time: 1200-1220 OT Time Calculation (min): 20 min  Charges: OT General Charges $OT Visit: 1 Visit OT Treatments $Therapeutic Activity: 8-22 mins  Dick Laine, OTA Acute Rehabilitation Services  Office [phone]   Jeb LITTIE Laine 08/31/2024, 1:47 PM

## 2024-08-31 NOTE — Inpatient Diabetes Management (Addendum)
 Inpatient Diabetes Program Recommendations  AACE/ADA: New Consensus Statement on Inpatient Glycemic Control (2015)  Target Ranges:  Prepandial:   less than 140 mg/dL      Peak postprandial:   less than 180 mg/dL (1-2 hours)      Critically ill patients:  140 - 180 mg/dL   Lab Results  Component Value Date   GLUCAP 130 (H) 08/31/2024   HGBA1C 6.0 (H) 08/12/2024    Review of Glycemic Control  Latest Reference Range & Units 08/30/24 07:39 08/30/24 11:46 08/30/24 16:28 08/30/24 19:52 08/31/24 00:40 08/31/24 03:56 08/31/24 07:53  Glucose-Capillary 70 - 99 mg/dL 886 (H) 673 (H) 718 (H) 284 (H) 196 (H) 99 130 (H)  (H): Data is abnormally high  Diabetes history: DM 2 Outpatient Diabetes medications:  Insulin  pump- T slim nsulin Pump setting:  Basal ( total 54.750 units) MN-2.0u/hour 2AM-    1.8  7AM-2.250 4PM-2.7 7PM-   3.0 10PM- 2.20   Bolus CHO Ratio (1unit:CHO) MN-1:8   No exact carb counting, he has been using 20-80 carb count based on meal size.   Correction/Sensitivity: MN-1:30 10PM-1:35   Target: 110   Active insulin  time: 5 hours  Current orders for Inpatient glycemic control:  Novolog  0-20 units Q4H Semglee  28 units QD    Inpatient Diabetes Program Recommendations:    Dysphagia 1 diet with honey thick liquid, postprandials elevated.  Please consider:  Novolog  3 units TID with meals if he consumes at least 50%. Novolog  0-15 units TID and 0-5 units at bedtime.  Thank you, Wyvonna Pinal, MSN, CDCES Diabetes Coordinator Inpatient Diabetes Program [phone] (team pager from 8a-5p)

## 2024-09-01 ENCOUNTER — Inpatient Hospital Stay (HOSPITAL_COMMUNITY)

## 2024-09-01 DIAGNOSIS — I10 Essential (primary) hypertension: Secondary | ICD-10-CM | POA: Diagnosis not present

## 2024-09-01 DIAGNOSIS — I619 Nontraumatic intracerebral hemorrhage, unspecified: Secondary | ICD-10-CM | POA: Diagnosis not present

## 2024-09-01 DIAGNOSIS — J9601 Acute respiratory failure with hypoxia: Secondary | ICD-10-CM | POA: Diagnosis not present

## 2024-09-01 DIAGNOSIS — I610 Nontraumatic intracerebral hemorrhage in hemisphere, subcortical: Secondary | ICD-10-CM | POA: Diagnosis not present

## 2024-09-01 LAB — GLUCOSE, CAPILLARY
Glucose-Capillary: 124 mg/dL — ABNORMAL HIGH (ref 70–99)
Glucose-Capillary: 154 mg/dL — ABNORMAL HIGH (ref 70–99)
Glucose-Capillary: 206 mg/dL — ABNORMAL HIGH (ref 70–99)
Glucose-Capillary: 184 mg/dL — ABNORMAL HIGH (ref 70–99)
Glucose-Capillary: 201 mg/dL — ABNORMAL HIGH (ref 70–99)

## 2024-09-01 MED ORDER — ENSURE PLUS HIGH PROTEIN PO LIQD
237.0000 mL | Freq: Two times a day (BID) | ORAL | Status: DC
  Administered 2024-09-02 – 2024-09-03 (×3): 237 mL via ORAL

## 2024-09-01 NOTE — Progress Notes (Signed)
 Physical Therapy Treatment Patient Details Name: Alec [name] MRN: [id number] DOB: 1945-11-29 Today's Date: 09/01/2024   History of Present Illness 78 y.o. male adm 08/10/2024 with Rt weakness and aphasia. Pt with large Lt basal ganglia hemorrhage complicated by AKI and aspiration. Intubated 11/18-11/21. PMHx: DM, HTN, CAD s/p CABG, CHF, RA, COPD, CKD, bladder & prostate CA, anxiety, gout, HLD    PT Comments  Pt tolerated treatment well today. Pt able to perform x5 sit to stand in stedy with +2 Min A from flaps. Cues for coming to midline and upright posture with mirror provided for visual feedback. No change in DC/DME recs at this time. PT will continue to follow.      If plan is discharge home, recommend the following: Two people to help with walking and/or transfers;Two people to help with bathing/dressing/bathroom;Assistance with feeding;Direct supervision/assist for medications management;Direct supervision/assist for financial management;Assist for transportation;Help with stairs or ramp for entrance;Supervision due to cognitive status   Can travel by private vehicle     No  Equipment Recommendations  Wheelchair (measurements PT);Wheelchair cushion (measurements PT);Hospital bed;Hoyer lift;BSC/3in1    Recommendations for Other Services       Precautions / Restrictions Precautions Precautions: Fall;Other (comment) Recall of Precautions/Restrictions: Impaired Precaution/Restrictions Comments: SBP <160; pushes to the R Restrictions Weight Bearing Restrictions Per Provider Order: No     Mobility  Bed Mobility Overal bed mobility: Needs Assistance Bed Mobility: Supine to Sit, Sit to Supine     Supine to sit: +2 for physical assistance, Min assist Sit to supine: +2 for physical assistance, Min assist   General bed mobility comments: patient assisting with attempting to move BLE towards EOB and required +2 Min assist to complete and assistance with trunk.     Transfers Overall transfer level: Needs assistance Equipment used: Ambulation equipment used Transfers: Sit to/from Stand Sit to Stand: +2 physical assistance, Min assist           General transfer comment: Pt able to perform x5 sit to stand in stedy with +2 Min A from flaps. Cues for coming to midline and upright posture with mirror provided for visual cueing. Transfer via Lift Equipment: Stedy  Ambulation/Gait               General Gait Details: unable at this time   Comptroller Bed    Modified Rankin (Stroke Patients Only) Modified Rankin (Stroke Patients Only) Pre-Morbid Rankin Score: No symptoms Modified Rankin: Severe disability     Balance Overall balance assessment: Needs assistance Sitting-balance support: Single extremity supported, Feet supported Sitting balance-Leahy Scale: Poor Sitting balance - Comments: pushes with LUE and required mod assist with bouts of min assist with patient using LUE to hold onto foot of bed. Good response to mirror for visual feedback.   Standing balance support: Bilateral upper extremity supported, During functional activity Standing balance-Leahy Scale: Poor Standing balance comment: reliant on therapist to stand                            Communication Communication Communication: Impaired Factors Affecting Communication: Difficulty expressing self;Reduced clarity of speech  Cognition Arousal: Alert Behavior During Therapy: Flat affect   PT - Cognitive impairments: Awareness, Safety/Judgement, Problem solving, Initiation, Attention  PT - Cognition Comments: Pt alert today able to follow commands appropriately. Following commands: Impaired Following commands impaired: Follows one step commands inconsistently, Follows one step commands with increased time    Cueing Cueing Techniques: Verbal cues, Tactile cues, Gestural cues,  Visual cues  Exercises      General Comments General comments (skin integrity, edema, etc.): VSS      Pertinent Vitals/Pain Pain Assessment Pain Assessment: No/denies pain    Home Living                          Prior Function            PT Goals (current goals can now be found in the care plan section) Progress towards PT goals: Progressing toward goals    Frequency    Min 2X/week      PT Plan      Co-evaluation              AM-PAC PT 6 Clicks Mobility   Outcome Measure  Help needed turning from your back to your side while in a flat bed without using bedrails?: A Lot Help needed moving from lying on your back to sitting on the side of a flat bed without using bedrails?: A Lot Help needed moving to and from a bed to a chair (including a wheelchair)?: Total Help needed standing up from a chair using your arms (e.g., wheelchair or bedside chair)?: Total Help needed to walk in hospital room?: Total Help needed climbing 3-5 steps with a railing? : Total 6 Click Score: 8    End of Session Equipment Utilized During Treatment: Gait belt Activity Tolerance: Patient tolerated treatment well;Patient limited by fatigue Patient left: in bed;with call bell/phone within reach;with bed alarm set;with family/visitor present Nurse Communication: Mobility status;Need for lift equipment PT Visit Diagnosis: Hemiplegia and hemiparesis;Unsteadiness on feet (R26.81);Other abnormalities of gait and mobility (R26.89);Muscle weakness (generalized) (M62.81);Difficulty in walking, not elsewhere classified (R26.2);Other symptoms and signs involving the nervous system (R29.898) Hemiplegia - Right/Left: Right Hemiplegia - dominant/non-dominant: Dominant Hemiplegia - caused by: Nontraumatic intracerebral hemorrhage     Time: 1415-1434 PT Time Calculation (min) (ACUTE ONLY): 19 min  Charges:    $Therapeutic Activity: 8-22 mins PT General Charges $$ ACUTE PT VISIT: 1  Visit                     Scout Guyett B, PT, DPT Acute Rehab Services [phone]    Solmon Bohr 09/01/2024, 3:35 PM

## 2024-09-01 NOTE — Plan of Care (Signed)
  Problem: Intracerebral Hemorrhage Tissue Perfusion: Goal: Complications of Intracerebral Hemorrhage will be minimized Outcome: Progressing   Problem: Coping: Goal: Will verbalize positive feelings about self Outcome: Progressing   Problem: Health Behavior/Discharge Planning: Goal: Ability to manage health-related needs will improve Outcome: Progressing Goal: Goals will be collaboratively established with patient/family Outcome: Progressing   Problem: Self-Care: Goal: Ability to participate in self-care as condition permits will improve Outcome: Progressing Goal: Verbalization of feelings and concerns over difficulty with self-care will improve Outcome: Progressing Goal: Ability to communicate needs accurately will improve Outcome: Progressing   Problem: Nutrition: Goal: Risk of aspiration will decrease Outcome: Progressing Goal: Dietary intake will improve Outcome: Progressing   Problem: Coping: Goal: Ability to adjust to condition or change in health will improve Outcome: Progressing   Problem: Fluid Volume: Goal: Ability to maintain a balanced intake and output will improve Outcome: Progressing   Problem: Metabolic: Goal: Ability to maintain appropriate glucose levels will improve Outcome: Progressing   Problem: Nutritional: Goal: Maintenance of adequate nutrition will improve Outcome: Progressing Goal: Progress toward achieving an optimal weight will improve Outcome: Progressing   Problem: Skin Integrity: Goal: Risk for impaired skin integrity will decrease Outcome: Progressing   Problem: Tissue Perfusion: Goal: Adequacy of tissue perfusion will improve Outcome: Progressing   Problem: Clinical Measurements: Goal: Ability to maintain clinical measurements within normal limits will improve Outcome: Progressing Goal: Will remain free from infection Outcome: Progressing Goal: Diagnostic test results will improve Outcome: Progressing Goal: Respiratory  complications will improve Outcome: Progressing Goal: Cardiovascular complication will be avoided Outcome: Progressing   Problem: Activity: Goal: Risk for activity intolerance will decrease Outcome: Progressing   Problem: Nutrition: Goal: Adequate nutrition will be maintained Outcome: Progressing   Problem: Coping: Goal: Level of anxiety will decrease Outcome: Progressing   Problem: Elimination: Goal: Will not experience complications related to bowel motility Outcome: Progressing Goal: Will not experience complications related to urinary retention Outcome: Progressing   Problem: Pain Managment: Goal: General experience of comfort will improve and/or be controlled Outcome: Progressing   Problem: Safety: Goal: Ability to remain free from injury will improve Outcome: Progressing   Problem: Skin Integrity: Goal: Risk for impaired skin integrity will decrease Outcome: Progressing

## 2024-09-01 NOTE — Progress Notes (Signed)
 Modified Barium Swallow Study  Patient Details  Name: Alec [name] MRN: [id number] Date of Birth: 1945/11/17  Today's Date: 09/01/2024  Modified Barium Swallow completed.  Full report located under Chart Review in the Imaging Section.  History of Present Illness GOLDEN Alec [name]) is a 78 y.o. male who presented 11/11 with right-sided weakness and aphasia. Head CT with large L hemorrage. CT 11/12: Interval increase in size of left basal ganglia hemorrhage, now measuring 3.2 x 3.6 x 2.9 cm (estimated volume 17 mL, previously 11 mL). Mild increase in surrounding edema without significant midline shift. Pt became obtunded on 11/18 and was intubated and transferred to ICU. Pt with hx of diabetes, hypertension, CAD, CHF, RA, COPD.  Patient underwent MBS on 1124 and was advised to have pure and honey thick liquid via teaspoon.  SlP seen for follow up for dysphagia and dysarthria, cognitive management.  Repeat MBS indicated to determine readiness for diet advancement as intake is poor.   Clinical Impression Patient continues with moderate oropharyngeal dysphagia with marginal improvement from previous study. Oral phase remains delayed and is discoordinated - allowing spillage of boluses into pharynx. Continued largest variable that contributes to aspiration is a delay in swallow initiation and decreased laryngeal closure allowing penetration of thin both prior to and during the swallow. Chin tuck posture not helpful to prevent aspiration of thin as it allowed bolus to spill into open airway.  SMALLER bolus size of nectar was tolerated without aspiration nor deep penetration.  Solids were masticated and orally transited but were noted to pool at tongue base with delayed reflexive swallow.  No aspiration or penetration of solid/puree/honey thick noted. Pharyngeal strength largely intact with min retention at vallecula with increased viscosity and at pyriform with liquid noted.  Counting 1,2,3. to  elicit swallow was not helpful - however SMALL BOLUSES provides optimal airway protection.  HOB reclined to approx. 45* was helpful with thin liquids - as it allowed pooled boluses to be retained - but may worsen airway protection with solids- thus recommend to maintain HOB upright.   In light of VERY POOR po intake impacting nutrition and hydration and in discussion with MD via secure chat - diet was advanced to dys3/nectar - providing puree bolus to trigger pharyngeal swallow of masticated solids and allowing teaspoons of thin liquids. Will follow up for dysphagia management including providing RMST to improve cough strength as well as laryngeal closure. Factors that may increase risk of adverse event in presence of aspiration Alec [name] & Alec [name] 2021): Limited mobility;Dependence for feeding and/or oral hygiene;Weak cough  Swallow Evaluation Recommendations Recommendations: PO diet PO Diet Recommendation: Dysphagia 3 (Mechanical soft);Mildly thick liquids (Level 2, nectar thick) (tsps of thin ok any time) Liquid Administration via: Cup;Straw;Other (Comment) Medication Administration: Other (Comment) (consider with nectar or icecream) Supervision: Full assist for feeding Swallowing strategies  : Small bites/sips;Slow rate;Check for pocketing or oral holding;Minimize environmental distractions (use puree to trigger swallow with masticated solids!) Postural changes: Position pt fully upright for meals;Stay upright 30-60 min after meals Oral care recommendations: Oral care QID (4x/day) Caregiver Recommendations: Have oral suction available    Alec POUR, MS [facility] SLP Acute Rehab Services Office [phone]   Nicolas Emmie Caldron 09/01/2024,10:51 AM

## 2024-09-01 NOTE — Plan of Care (Signed)
  Problem: Coping: Goal: Will verbalize positive feelings about self Outcome: Progressing Goal: Will identify appropriate support needs Outcome: Progressing   Problem: Self-Care: Goal: Ability to participate in self-care as condition permits will improve Outcome: Progressing Goal: Verbalization of feelings and concerns over difficulty with self-care will improve Outcome: Progressing Goal: Ability to communicate needs accurately will improve Outcome: Progressing   Problem: Nutrition: Goal: Risk of aspiration will decrease Outcome: Progressing Goal: Dietary intake will improve Outcome: Progressing   Problem: Coping: Goal: Ability to adjust to condition or change in health will improve Outcome: Progressing

## 2024-09-01 NOTE — Progress Notes (Addendum)
 Speech Language Pathology Treatment: Dysphagia  Patient Details Name: Alec [name] MRN: [id number] DOB: [birth year] Today's Date: 09/01/2024 Time: 8884-8854 SLP Time Calculation (min) (ACUTE ONLY): 30 min  Assessment / Plan / Recommendation Clinical Impression  SLP follow up for dysphagia management to review MBS with family/pt and implement compensation swallow strategies. Reviewed flouroscopy loops with pt and wife - using teach back for swallow precautions. SLP then functionally assisted with PO intake to aid carryover of compensations to maximize PO and decrease aspiration risk. Pt was provided with solids 0 moistened crackers, icecream and nectar thick tea. Total cues fading to max to place boluses on left strong side required. SLP provided pt with loaded spoon of icecream to aid pharyngeal swallow reflex of masticated solids with swallow observed. Asked wife to order or ask for icecream with meals to use to elicit solid food swallow - due to improved thermal input and subsequent swallow efficiency. Subtle throat clear x2 noted with nectar thick liquids. He does benefit from CUE TO NOT TALK until he has swallowed. Using teach back, pt and wife relayed increased pna risk with aspirating SOLIDS vs LIQUIDS.   Pt's wife observed and was educated to increased aspiration risk with current diet but compensation strategies in place to mitigate risk. All in agreement to current regimen and swallow signs posted - RN updated. Hopeful this plan will improve intake including liquid intake to improve nutrition, hydration and kidney function. Wife, pt's brother *and another male* in room at end of session.     HPI HPI: Alec [name] Alec [name]) is a 78 y.o. male who presented 11/11 with right-sided weakness and aphasia. Head CT with large L hemorrage. CT 11/12: Interval increase in size of left basal ganglia hemorrhage, now measuring 3.2 x 3.6 x 2.9 cm (estimated volume 17 mL, previously 11 mL). Mild increase  in surrounding edema without significant midline shift. Pt became obtunded on 11/18 and was intubated and transferred to ICU. Pt with hx of diabetes, hypertension, CAD, CHF, RA, COPD.  Patient underwent MBS on 1124 and was advised to have pure and honey thick liquid via teaspoon.  SlP seen for follow up for dysphagia and dysarthria, cognitive management.  Repeat MBS indicated to determine readiness for diet advancement as intake is poor.      SLP Plan  Continue with current plan of care        Swallow Evaluation Recommendations   Recommendations: PO diet PO Diet Recommendation: Dysphagia 3 (Mechanical soft);Mildly thick liquids (Level 2, nectar thick) (tsps of thin ok any time) Liquid Administration via: Cup;Straw;Other (Comment) Medication Administration: Other (Comment) (consider with nectar or icecream) Supervision: Full assist for feeding Postural changes: Position pt fully upright for meals;Stay upright 30-60 min after meals Oral care recommendations: Oral care QID (4x/day) Caregiver Recommendations: Have oral suction available     Recommendations  Diet recommendations: Dysphagia 3 (mechanical soft);Nectar-thick liquid;Other(comment) (tsps thin ok) Medication Administration: Other (Comment) (can try with icecream or nectar, whole if small) Compensations: Small sips/bites;Slow rate;Other (Comment) (use puree to aid oral transiting of solids) Postural Changes and/or Swallow Maneuvers: Upright 30-60 min after meal;Seated upright 90 degrees                  Oral care before and after PO   Frequent or constant Supervision/Assistance Dysphagia, oropharyngeal phase (R13.12)     Continue with current plan of care    Alec POUR, MS [facility] SLP Acute Rehab Services Office [phone]  Alec [name]  09/01/2024,  12:21 PM

## 2024-09-01 NOTE — Progress Notes (Signed)
 [facility]  PROGRESS NOTE  Alec [name] FMW:[id number] DOB: 11/17/1945 DOA: 08/10/2024 PCP: Loreli Kins, MD   Brief HPI:   78yo with hx DM, HTN, CAD, CHF, RA, COPD, and chronic pancytopenia who initially presented on 11/11 with R sided weakness and aphasia, found to have L sided subcortical ICH suspected due to uncontrolled hypertension. Hospital course complicated with acute encephalopathy, dysphagia on modified diet as per SLP recommendations, aspiration pneumonia treated with antibiotic course, acute respiratory failure requiring mechanical ventilation then extubated on 11/21, AKI on CKD 4 now at new baseline, hypernatremia that has been recurrent in nature due to poor oral hydration resolved afternoon 12/2 with IV hydration, and hyperkalemia resolved with improvement in renal function. Need to encourage oral intake and monitor Na for stability.     Assessment/Plan:   Acute encephalopathy Likely in the setting of ICH  -Encephalopathy seems to have resolved -Continue Seroquel  at bedtime    Hypernatremia -Improved; D5W has been stopped -Started on dysphagia 3 diet with nectar thick liquid -Will follow sodium level in a.m.    CKD (chronic kidney disease), stage IV (HCC) -Developed AKI on underlying CKD stage IV during hospitalization -Nephrology consulted; not a good candidate for long-term hemodialysis -Creatinine is 4.16, close to baseline   ICH (intracerebral hemorrhage) (HCC) Stable with midline shift, unlikely secondary to uncontrolled hypertension. -Continue holding antiplatelets. - cont statin - PT recommend SNF   Acute respiratory failure with hypoxia (HCC) -Resolved Patient developed acute hypoxic respiratory failure requiring intubation, extubated on 11/21 and now weaned back to room air. -Continue to monitor -Supplemental oxygen as needed   Hypertension Blood pressure better controlled Patient was on multiple antihypertensives at home with concern  of being noncompliant. He was not on any antihypertensives when transferred out of ICU, likely some concern of hypotension with acute decompensation. - Continue amlodipine , hydralazine , Imdur   Type 2 diabetes mellitus with chronic kidney disease, with long-term current use of insulin  (HCC) CBG currently within goal. - Continue Semglee , sliding cell insulin  NovoLog   Aspiration pneumonia (HCC) Patient was started on Unasyn  in ICU for concern of aspiration pneumonia. Finished Unasyn  7-day course on 11/24   COPD (chronic obstructive pulmonary disease) (HCC) -Continue with bronchodilators   Dysphagia Improving --Underwent modified barium swallow today -Started on dysphagia 3 diet with nectar thick liquid  Dyspepsia - postprantial belching and heartburn - start pantoprazole  trial - start maalox PRN   Metabolic acidosis likely secondary to CKD. - Titrate p.o. bicarb tablets as needed -Continue to monitor   Hyperlipidemia - Continue with Lipitor      DVT prophylaxis: Heparin   Medications     allopurinol   50 mg Oral Daily   amLODipine   10 mg Oral Daily   atorvastatin   40 mg Oral Daily   feeding supplement  237 mL Oral BID BM   heparin  injection (subcutaneous)  5,000 Units Subcutaneous Q8H   hydrALAZINE   100 mg Oral Q8H   insulin  aspart  0-15 Units Subcutaneous TID WC   insulin  aspart  0-5 Units Subcutaneous QHS   insulin  glargine-yfgn  28 Units Subcutaneous Daily   isosorbide  mononitrate  120 mg Oral Daily   latanoprost   1 drop Both Eyes QHS   melatonin  3 mg Oral QHS   multivitamin with minerals  1 tablet Oral Daily   mouth rinse  15 mL Mouth Rinse 4 times per day   pantoprazole  (PROTONIX ) IV  40 mg Intravenous Q24H   QUEtiapine   25 mg Oral QHS   senna-docusate  1 tablet Oral BID   sertraline   50 mg Oral Daily   sodium bicarbonate   1,300 mg Oral BID     Data Reviewed:   CBG:  Recent Labs  Lab 08/31/24 0753 08/31/24 1145 08/31/24 1614 08/31/24 2118  09/01/24 0605  GLUCAP 130* 252* 247* 169* 124*    SpO2: 99 % O2 Flow Rate (L/min): 4 L/min FiO2 (%): 98 %    Vitals:   09/01/24 0330 09/01/24 0500 09/01/24 0604 09/01/24 0725  BP: (!) 150/58  (!) 147/58 (!) 145/60  Pulse:    71  Resp: 19   19  Temp: 98.7 F (37.1 C)   99 F (37.2 C)  TempSrc: Oral   Oral  SpO2: 97%   99%  Weight:  85.4 kg    Height:          Data Reviewed:  Basic Metabolic Panel: Recent Labs  Lab 08/30/24 0218 08/30/24 0809 08/31/24 0524 08/31/24 1349 08/31/24 2238  NA 150* 149* 145 142 140  K 3.9 3.9 3.7 3.7 3.6  CL 116* 116* 112* 109 106  CO2 20* 23 24 22 25   GLUCOSE 131* 120* 104* 247* 196*  BUN 60* 58* 60* 63* 58*  CREATININE 4.29* 4.38* 4.40* 4.30* 4.16*  CALCIUM  9.3 9.1 8.7* 8.8* 8.7*    CBC: Recent Labs  Lab 08/29/24 0413  WBC 4.4  HGB 10.9*  HCT 33.1*  MCV 95.7  PLT 156    LFT No results for input(s): AST, ALT, ALKPHOS, BILITOT, PROT, ALBUMIN  in the last 168 hours.   Antibiotics: Anti-infectives (From admission, onward)    Start     Dose/Rate Route Frequency Ordered Stop   08/20/24 0000  Ampicillin -Sulbactam (UNASYN ) 3 g in sodium chloride  0.9 % 100 mL IVPB        3 g 200 mL/hr over 30 Minutes Intravenous Every 12 hours 08/19/24 1344 08/23/24 2108   08/18/24 2000  piperacillin -tazobactam (ZOSYN ) IVPB 2.25 g  Status:  Discontinued        2.25 g 100 mL/hr over 30 Minutes Intravenous Every 6 hours 08/18/24 1520 08/19/24 1342   08/18/24 1400  piperacillin -tazobactam (ZOSYN ) IVPB 3.375 g  Status:  Discontinued        3.375 g 12.5 mL/hr over 240 Minutes Intravenous Every 8 hours 08/18/24 0736 08/18/24 1520   08/17/24 1600  piperacillin -tazobactam (ZOSYN ) IVPB 2.25 g  Status:  Discontinued        2.25 g 100 mL/hr over 30 Minutes Intravenous Every 6 hours 08/17/24 1500 08/18/24 0736        CONSULTS   Code Status: Full code  Family Communication: Discussed with patient's wife at  bedside     Subjective   Underwent modified barium swallow test, started on dysphagia 3 diet   Objective    Physical Examination:   Appears in no acute distress S1-S2, regular, no murmur auscultated Abdomen is soft, nontender Neuro-alert, right upper extremity and right lower extremity are tested, left upper extremity and left lower extremity are 5/5 motor strength     Wound 08/17/24 1200 Pressure Injury Sacrum Stage 1 -  Intact skin with non-blanchable redness of a localized area usually over a bony prominence. (Active)     Wound 08/17/24 1200 Pressure Injury Heel Left Stage 1 -  Intact skin with non-blanchable redness of a localized area usually over a bony prominence. (Active)        Sabas GORMAN Brod   [facility] If 7PM-7AM, please contact night-coverage at [email or web address], Office  [phone]   09/01/2024, 8:22 AM  LOS: 22 days

## 2024-09-02 DIAGNOSIS — I10 Essential (primary) hypertension: Secondary | ICD-10-CM | POA: Diagnosis not present

## 2024-09-02 DIAGNOSIS — I610 Nontraumatic intracerebral hemorrhage in hemisphere, subcortical: Secondary | ICD-10-CM | POA: Diagnosis not present

## 2024-09-02 DIAGNOSIS — I619 Nontraumatic intracerebral hemorrhage, unspecified: Secondary | ICD-10-CM | POA: Diagnosis not present

## 2024-09-02 DIAGNOSIS — J9601 Acute respiratory failure with hypoxia: Secondary | ICD-10-CM | POA: Diagnosis not present

## 2024-09-02 LAB — BASIC METABOLIC PANEL WITH GFR
Anion gap: 11 (ref 5–15)
BUN: 52 mg/dL — ABNORMAL HIGH (ref 8–23)
CO2: 22 mmol/L (ref 22–32)
Calcium: 8.9 mg/dL (ref 8.9–10.3)
Chloride: 110 mmol/L (ref 98–111)
Creatinine, Ser: 3.98 mg/dL — ABNORMAL HIGH (ref 0.61–1.24)
GFR, Estimated: 15 mL/min — ABNORMAL LOW (ref >60)
Glucose, Bld: 123 mg/dL — ABNORMAL HIGH (ref 70–99)
Potassium: 3.6 mmol/L (ref 3.5–5.1)
Sodium: 143 mmol/L (ref 135–145)

## 2024-09-02 LAB — GLUCOSE, CAPILLARY
Glucose-Capillary: 113 mg/dL — ABNORMAL HIGH (ref 70–99)
Glucose-Capillary: 262 mg/dL — ABNORMAL HIGH (ref 70–99)
Glucose-Capillary: 234 mg/dL — ABNORMAL HIGH (ref 70–99)
Glucose-Capillary: 198 mg/dL — ABNORMAL HIGH (ref 70–99)

## 2024-09-02 LAB — MAGNESIUM: Magnesium: 2 mg/dL (ref 1.7–2.4)

## 2024-09-02 MED ORDER — PANTOPRAZOLE SODIUM 40 MG PO TBEC
40.0000 mg | DELAYED_RELEASE_TABLET | Freq: Every day | ORAL | Status: DC
  Administered 2024-09-03 – 2024-09-04 (×2): 40 mg via ORAL
  Filled 2024-09-02 (×2): qty 1

## 2024-09-02 MED ORDER — CARVEDILOL 6.25 MG PO TABS
6.2500 mg | ORAL_TABLET | Freq: Two times a day (BID) | ORAL | Status: DC
  Administered 2024-09-02 – 2024-09-04 (×5): 6.25 mg via ORAL
  Filled 2024-09-02 (×5): qty 1

## 2024-09-02 MED ORDER — AMLODIPINE BESYLATE 5 MG PO TABS
5.0000 mg | ORAL_TABLET | Freq: Every day | ORAL | Status: DC
  Administered 2024-09-02 – 2024-09-04 (×3): 5 mg via ORAL
  Filled 2024-09-02 (×3): qty 1

## 2024-09-02 MED ORDER — POTASSIUM CHLORIDE 20 MEQ PO PACK
40.0000 meq | PACK | Freq: Once | ORAL | Status: AC
  Administered 2024-09-02: 40 meq via ORAL
  Filled 2024-09-02: qty 2

## 2024-09-02 NOTE — Progress Notes (Signed)
 Patient had 21 beats of VT, patient rested in bed without any distress, no chest pain reported. On call MD notified and aware, STAT BMP and Mag ordered by MD, plan of care ongoing.   Daril ORN RN   09/02/24 05:00

## 2024-09-02 NOTE — Plan of Care (Signed)
  Problem: Education: Goal: Knowledge of disease or condition will improve Outcome: Progressing Goal: Knowledge of secondary prevention will improve (MUST DOCUMENT ALL) Outcome: Progressing Goal: Knowledge of patient specific risk factors will improve (DELETE if not current risk factor) Outcome: Progressing   Problem: Intracerebral Hemorrhage Tissue Perfusion: Goal: Complications of Intracerebral Hemorrhage will be minimized Outcome: Progressing   Problem: Coping: Goal: Will verbalize positive feelings about self Outcome: Progressing Goal: Will identify appropriate support needs Outcome: Progressing   Problem: Health Behavior/Discharge Planning: Goal: Ability to manage health-related needs will improve Outcome: Progressing Goal: Goals will be collaboratively established with patient/family Outcome: Progressing   Problem: Self-Care: Goal: Ability to participate in self-care as condition permits will improve Outcome: Progressing Goal: Verbalization of feelings and concerns over difficulty with self-care will improve Outcome: Progressing Goal: Ability to communicate needs accurately will improve Outcome: Progressing   Problem: Nutrition: Goal: Risk of aspiration will decrease Outcome: Progressing Goal: Dietary intake will improve Outcome: Progressing   Problem: Education: Goal: Ability to describe self-care measures that may prevent or decrease complications (Diabetes Survival Skills Education) will improve Outcome: Progressing Goal: Individualized Educational Video(s) Outcome: Progressing   Problem: Coping: Goal: Ability to adjust to condition or change in health will improve Outcome: Progressing   Problem: Fluid Volume: Goal: Ability to maintain a balanced intake and output will improve Outcome: Progressing   Problem: Health Behavior/Discharge Planning: Goal: Ability to identify and utilize available resources and services will improve Outcome: Progressing Goal:  Ability to manage health-related needs will improve Outcome: Progressing   Problem: Metabolic: Goal: Ability to maintain appropriate glucose levels will improve Outcome: Progressing   Problem: Nutritional: Goal: Maintenance of adequate nutrition will improve Outcome: Progressing Goal: Progress toward achieving an optimal weight will improve Outcome: Progressing   Problem: Skin Integrity: Goal: Risk for impaired skin integrity will decrease Outcome: Progressing   Problem: Tissue Perfusion: Goal: Adequacy of tissue perfusion will improve Outcome: Progressing   Problem: Education: Goal: Knowledge of General Education information will improve Description: Including pain rating scale, medication(s)/side effects and non-pharmacologic comfort measures Outcome: Progressing   Problem: Health Behavior/Discharge Planning: Goal: Ability to manage health-related needs will improve Outcome: Progressing   Problem: Clinical Measurements: Goal: Ability to maintain clinical measurements within normal limits will improve Outcome: Progressing Goal: Will remain free from infection Outcome: Progressing Goal: Diagnostic test results will improve Outcome: Progressing Goal: Respiratory complications will improve Outcome: Progressing Goal: Cardiovascular complication will be avoided Outcome: Progressing   Problem: Activity: Goal: Risk for activity intolerance will decrease Outcome: Progressing   Problem: Nutrition: Goal: Adequate nutrition will be maintained Outcome: Progressing   Problem: Coping: Goal: Level of anxiety will decrease Outcome: Progressing   Problem: Elimination: Goal: Will not experience complications related to bowel motility Outcome: Progressing Goal: Will not experience complications related to urinary retention Outcome: Progressing   Problem: Pain Managment: Goal: General experience of comfort will improve and/or be controlled Outcome: Progressing   Problem:  Safety: Goal: Ability to remain free from injury will improve Outcome: Progressing   Problem: Skin Integrity: Goal: Risk for impaired skin integrity will decrease Outcome: Progressing

## 2024-09-02 NOTE — Progress Notes (Signed)
 [facility]  PROGRESS NOTE  Alec [name] FMW:[id number] DOB: 06-Apr-1946 DOA: 08/10/2024 PCP: Loreli Kins, MD   Brief HPI:   78yo with hx DM, HTN, CAD, CHF, RA, COPD, and chronic pancytopenia who initially presented on 11/11 with R sided weakness and aphasia, found to have L sided subcortical ICH suspected due to uncontrolled hypertension. Hospital course complicated with acute encephalopathy, dysphagia on modified diet as per SLP recommendations, aspiration pneumonia treated with antibiotic course, acute respiratory failure requiring mechanical ventilation then extubated on 11/21, AKI on CKD 4 now at new baseline, hypernatremia that has been recurrent in nature due to poor oral hydration resolved afternoon 12/2 with IV hydration, and hyperkalemia resolved with improvement in renal function. Need to encourage oral intake and monitor Na for stability.     Assessment/Plan:   Acute encephalopathy Likely in the setting of ICH  -Encephalopathy seems to have resolved -Continue Seroquel  at bedtime  Nonsustained V. Tach - Had 21 beat nonsustained V. tach this morning - Serum potassium was 3.6, serum magnesium  2.0 - Will start Coreg  6.25 mg p.o. twice daily - Will cut down the dose of amlodipine  to 5 mg daily    Hypernatremia -Serum sodium is 143 today -Improved; D5W has been stopped -Started on dysphagia 3 diet with nectar thick liquid     CKD (chronic kidney disease), stage IV (HCC) -Developed AKI on underlying CKD stage IV during hospitalization -Nephrology consulted; not a good candidate for long-term hemodialysis -Creatinine is 3.98, close to baseline   ICH (intracerebral hemorrhage) (HCC) Stable with midline shift, unlikely secondary to uncontrolled hypertension. -Continue holding antiplatelets. - cont statin - PT recommend SNF   Acute respiratory failure with hypoxia (HCC) -Resolved Patient developed acute hypoxic respiratory failure requiring intubation,  extubated on 11/21 and now weaned back to room air. -Continue to monitor -Supplemental oxygen as needed   Hypertension Blood pressure better controlled Discontinue hydralazine  - Started on Coreg  6.25 mg p.o. twice daily as above - Continue amlodipine  at low-dose of 5 mg daily, - Will titrate medications as per blood pressure response to new regimen  Type 2 diabetes mellitus with chronic kidney disease, with long-term current use of insulin  (HCC) CBG currently within goal. - Continue Semglee , sliding cell insulin  NovoLog   Aspiration pneumonia (HCC) Patient was started on Unasyn  in ICU for concern of aspiration pneumonia. Finished Unasyn  7-day course on 11/24   COPD (chronic obstructive pulmonary disease) (HCC) -Continue with bronchodilators   Dysphagia Improving --Underwent modified barium swallow today -Started on dysphagia 3 diet with nectar thick liquid  Dyspepsia - postprantial belching and heartburn - start pantoprazole  trial - start maalox PRN   Metabolic acidosis likely secondary to CKD. - Titrate p.o. bicarb tablets as needed -Continue to monitor   Hyperlipidemia - Continue with Lipitor      DVT prophylaxis: Heparin   Medications     allopurinol   50 mg Oral Daily   amLODipine   10 mg Oral Daily   atorvastatin   40 mg Oral Daily   feeding supplement  237 mL Oral BID BM   heparin  injection (subcutaneous)  5,000 Units Subcutaneous Q8H   hydrALAZINE   100 mg Oral Q8H   insulin  aspart  0-15 Units Subcutaneous TID WC   insulin  aspart  0-5 Units Subcutaneous QHS   insulin  glargine-yfgn  28 Units Subcutaneous Daily   isosorbide  mononitrate  120 mg Oral Daily   latanoprost   1 drop Both Eyes QHS   melatonin  3 mg Oral QHS   multivitamin with minerals  1 tablet Oral Daily   mouth rinse  15 mL Mouth Rinse 4 times per day   pantoprazole  (PROTONIX ) IV  40 mg Intravenous Q24H   QUEtiapine   25 mg Oral QHS   senna-docusate  1 tablet Oral BID   sertraline   50 mg Oral  Daily   sodium bicarbonate   1,300 mg Oral BID     Data Reviewed:   CBG:  Recent Labs  Lab 09/01/24 0949 09/01/24 1144 09/01/24 1514 09/01/24 2120 09/02/24 0645  GLUCAP 154* 206* 184* 201* 113*    SpO2: 100 % O2 Flow Rate (L/min): 4 L/min FiO2 (%): 98 %    Vitals:   09/02/24 0006 09/02/24 0400 09/02/24 0627 09/02/24 0712  BP: (!) 144/72 (!) 140/70 (!) 140/70 (!) 159/74  Pulse: 78 70  79  Resp:    19  Temp:  97.7 F (36.5 C)  98 F (36.7 C)  TempSrc:  Oral  Axillary  SpO2: 98% 99%  100%  Weight:      Height:          Data Reviewed:  Basic Metabolic Panel: Recent Labs  Lab 08/30/24 0809 08/31/24 0524 08/31/24 1349 08/31/24 2238 09/02/24 0720  NA 149* 145 142 140 143  K 3.9 3.7 3.7 3.6 3.6  CL 116* 112* 109 106 110  CO2 23 24 22 25 22   GLUCOSE 120* 104* 247* 196* 123*  BUN 58* 60* 63* 58* 52*  CREATININE 4.38* 4.40* 4.30* 4.16* 3.98*  CALCIUM  9.1 8.7* 8.8* 8.7* 8.9  MG  --   --   --   --  2.0    CBC: Recent Labs  Lab 08/29/24 0413  WBC 4.4  HGB 10.9*  HCT 33.1*  MCV 95.7  PLT 156    LFT No results for input(s): AST, ALT, ALKPHOS, BILITOT, PROT, ALBUMIN  in the last 168 hours.   Antibiotics: Anti-infectives (From admission, onward)    Start     Dose/Rate Route Frequency Ordered Stop   08/20/24 0000  Ampicillin -Sulbactam (UNASYN ) 3 g in sodium chloride  0.9 % 100 mL IVPB        3 g 200 mL/hr over 30 Minutes Intravenous Every 12 hours 08/19/24 1344 08/23/24 2108   08/18/24 2000  piperacillin -tazobactam (ZOSYN ) IVPB 2.25 g  Status:  Discontinued        2.25 g 100 mL/hr over 30 Minutes Intravenous Every 6 hours 08/18/24 1520 08/19/24 1342   08/18/24 1400  piperacillin -tazobactam (ZOSYN ) IVPB 3.375 g  Status:  Discontinued        3.375 g 12.5 mL/hr over 240 Minutes Intravenous Every 8 hours 08/18/24 0736 08/18/24 1520   08/17/24 1600  piperacillin -tazobactam (ZOSYN ) IVPB 2.25 g  Status:  Discontinued        2.25 g 100 mL/hr over  30 Minutes Intravenous Every 6 hours 08/17/24 1500 08/18/24 0736        CONSULTS   Code Status: Full code  Family Communication: Discussed with patient's wife at bedside     Subjective   Had 21 beat NSVT this am.  Denies any symptoms.   Objective    Physical Examination:  Appears in no acute distress S1-S2, regular, no murmur auscultated Lungs clear to auscultation bilaterally Abdomen is soft, nontender, no organomegaly     Wound 08/17/24 1200 Pressure Injury Sacrum Stage 1 -  Intact skin with non-blanchable redness of a localized area usually over a bony prominence. (Active)     Wound 08/17/24 1200 Pressure Injury Heel Left Stage 1 -  Intact skin with non-blanchable redness of a localized area usually over a bony prominence. (Active)        Sabas GORMAN Brod   [facility] If 7PM-7AM, please contact night-coverage at [email or web address], Office  [phone]   09/02/2024, 8:37 AM  LOS: 23 days

## 2024-09-02 NOTE — Progress Notes (Signed)
 Speech Language Pathology Treatment: Dysphagia  Patient Details Name: Alec [name] MRN: [id number] DOB: 28-Jul-1946 Today's Date: 09/02/2024 Time: 8749-8691 SLP Time Calculation (min) (ACUTE ONLY): 18 min  Assessment / Plan / Recommendation Clinical Impression  PLAN: continue dysphagia 3, nectar liquids. If coughing during meals, hold liquids and offer again after pt has finished eating solids.  Encourage sips of thin water /ice chips between meals and after oral care. D/W RN  Worked with pt during lunch today. Wife at bedside. When drinking nectar thick liquids during meal, coughing was explosive and consistent, despite 1:1 cues and vigilant attention to his swallowing precautions. We withheld liquids during the rest of his meal, then reintroduced them after eating solid foods.  Coughing subsided.   Overall, his appetite is much improved on dysphagia 3. Pt fed himself with intermittent assistance.   SLP will continue to follow.    HPI HPI: Alec [name] Alec [name]) is a 78 y.o. male who presented 11/11 with right-sided weakness and aphasia. Head CT with large L hemorrage. CT 11/12: Interval increase in size of left basal ganglia hemorrhage, now measuring 3.2 x 3.6 x 2.9 cm (estimated volume 17 mL, previously 11 mL). Mild increase in surrounding edema without significant midline shift. Pt became obtunded on 11/18 and was intubated and transferred to ICU. Pt with hx of diabetes, hypertension, CAD, CHF, RA, COPD.  Patient underwent MBS on 11/24 with recs for pure and honey thick liquid via teaspoon. Repeat MBS 12/2      SLP Plan  Continue with current plan of care        Swallow Evaluation Recommendations   Recommendations: PO diet PO Diet Recommendation: Dysphagia 3 (Mechanical soft);Moderately thick liquids (Level 3, honey thick) Liquid Administration via: Cup Medication Administration: Whole meds with puree Supervision: Full assist for feeding Postural changes: Position pt  fully upright for meals Caregiver Recommendations: Have oral suction available     Recommendations                     Oral care before and after PO   Frequent or constant Supervision/Assistance Dysphagia, oropharyngeal phase (R13.12)     Continue with current plan of care   Kathelyn Gombos L. Vona, MA CCC/SLP Clinical Specialist - Acute Care SLP Acute Rehabilitation Services Office number [phone]   Vona Palma Laurice  09/02/2024, 3:47 PM

## 2024-09-03 ENCOUNTER — Inpatient Hospital Stay (HOSPITAL_COMMUNITY)

## 2024-09-03 DIAGNOSIS — I619 Nontraumatic intracerebral hemorrhage, unspecified: Secondary | ICD-10-CM | POA: Diagnosis not present

## 2024-09-03 DIAGNOSIS — M19011 Primary osteoarthritis, right shoulder: Secondary | ICD-10-CM | POA: Diagnosis not present

## 2024-09-03 DIAGNOSIS — I10 Essential (primary) hypertension: Secondary | ICD-10-CM | POA: Diagnosis not present

## 2024-09-03 DIAGNOSIS — M25511 Pain in right shoulder: Secondary | ICD-10-CM | POA: Diagnosis not present

## 2024-09-03 DIAGNOSIS — E870 Hyperosmolality and hypernatremia: Secondary | ICD-10-CM | POA: Diagnosis not present

## 2024-09-03 DIAGNOSIS — I610 Nontraumatic intracerebral hemorrhage in hemisphere, subcortical: Secondary | ICD-10-CM | POA: Diagnosis not present

## 2024-09-03 LAB — BASIC METABOLIC PANEL WITH GFR
Anion gap: 14 (ref 5–15)
BUN: 50 mg/dL — ABNORMAL HIGH (ref 8–23)
CO2: 24 mmol/L (ref 22–32)
Calcium: 8.9 mg/dL (ref 8.9–10.3)
Chloride: 103 mmol/L (ref 98–111)
Creatinine, Ser: 3.9 mg/dL — ABNORMAL HIGH (ref 0.61–1.24)
GFR, Estimated: 15 mL/min — ABNORMAL LOW (ref >60)
Glucose, Bld: 133 mg/dL — ABNORMAL HIGH (ref 70–99)
Potassium: 4 mmol/L (ref 3.5–5.1)
Sodium: 141 mmol/L (ref 135–145)

## 2024-09-03 LAB — GLUCOSE, CAPILLARY
Glucose-Capillary: 105 mg/dL — ABNORMAL HIGH (ref 70–99)
Glucose-Capillary: 226 mg/dL — ABNORMAL HIGH (ref 70–99)
Glucose-Capillary: 220 mg/dL — ABNORMAL HIGH (ref 70–99)

## 2024-09-03 MED ORDER — QUETIAPINE FUMARATE 25 MG PO TABS: 25.0000 mg | ORAL_TABLET | Freq: Every day | ORAL | Status: AC

## 2024-09-03 MED ORDER — PREDNISONE 5 MG PO TABS: 10.0000 mg | ORAL_TABLET | Freq: Every day | ORAL | Status: DC

## 2024-09-03 MED ORDER — AMLODIPINE BESYLATE 5 MG PO TABS: 5.0000 mg | ORAL_TABLET | Freq: Every day | ORAL | Status: AC

## 2024-09-03 MED ORDER — INSULIN ASPART 100 UNIT/ML IJ SOLN: 0.0000 [IU] | Freq: Three times a day (TID) | INTRAMUSCULAR | Status: AC

## 2024-09-03 MED ORDER — PANTOPRAZOLE SODIUM 40 MG PO TBEC: 40.0000 mg | DELAYED_RELEASE_TABLET | Freq: Every day | ORAL | Status: AC

## 2024-09-03 MED ORDER — HYDRALAZINE HCL 25 MG PO TABS: 25.0000 mg | ORAL_TABLET | Freq: Three times a day (TID) | ORAL | Status: AC | PRN

## 2024-09-03 MED ORDER — PREDNISONE 20 MG PO TABS
40.0000 mg | ORAL_TABLET | Freq: Every day | ORAL | Status: DC
  Administered 2024-09-03 – 2024-09-04 (×2): 40 mg via ORAL
  Filled 2024-09-03 (×2): qty 2

## 2024-09-03 MED ORDER — CARVEDILOL 6.25 MG PO TABS: 6.2500 mg | ORAL_TABLET | Freq: Two times a day (BID) | ORAL | Status: AC

## 2024-09-03 MED ORDER — INSULIN GLARGINE-YFGN 100 UNIT/ML ~~LOC~~ SOLN: 28.0000 [IU] | Freq: Every day | SUBCUTANEOUS | Status: AC

## 2024-09-03 MED ORDER — SODIUM BICARBONATE 650 MG PO TABS: 650.0000 mg | ORAL_TABLET | Freq: Three times a day (TID) | ORAL | Status: AC

## 2024-09-03 MED ORDER — SENNOSIDES-DOCUSATE SODIUM 8.6-50 MG PO TABS: 1.0000 | ORAL_TABLET | Freq: Two times a day (BID) | ORAL | Status: AC

## 2024-09-03 NOTE — Progress Notes (Signed)
 Occupational Therapy Treatment Patient Details Name: Alec [name] MRN: [id number] DOB: 1946/06/19 Today's Date: 09/03/2024   History of present illness 78 y.o. male adm 08/10/2024 with Rt weakness and aphasia. Pt with large Lt basal ganglia hemorrhage complicated by AKI and aspiration. Intubated 11/18-11/21. PMHx: DM, HTN, CAD s/p CABG, CHF, RA, COPD, CKD, bladder & prostate CA, anxiety, gout, HLD   OT comments  Patient received in supine and agreeable to OT/PT session.  Patient's wife present and supportive during session. Patient requiring mod assist to get to EOB due to assistance needed to initiate and for RLE and trunk. Patient demonstrating right lateral leaning with mirror used for patient to self correct but required mod assist to maintain midline.  Patient performed standing in [place] with patient able to pull self up with min A +2.  Patient requiring increased assistance after fatigue and assisted back to supine.  Patient will benefit from continued inpatient follow up therapy, <3 hours/day.  Acute OT to continue to follow to address established goals to facilitate DC to next venue of care.        If plan is discharge home, recommend the following:  Two people to help with walking and/or transfers;A lot of help with walking and/or transfers;A lot of help with bathing/dressing/bathroom;Two people to help with bathing/dressing/bathroom;Assistance with cooking/housework;Direct supervision/assist for medications management;Direct supervision/assist for financial management;Assist for transportation;Help with stairs or ramp for entrance   Equipment Recommendations  Other (comment) (defer)    Recommendations for Other Services      Precautions / Restrictions Precautions Precautions: Fall;Other (comment) Recall of Precautions/Restrictions: Impaired Precaution/Restrictions Comments: SBP <160; pushes to the R Restrictions Weight Bearing Restrictions Per Provider Order: No        Mobility Bed Mobility Overal bed mobility: Needs Assistance Bed Mobility: Supine to Sit, Sit to Supine     Supine to sit: Mod assist, +2 for physical assistance Sit to supine: Max assist, +2 for safety/equipment   General bed mobility comments: patient requiring assistance to initiate and with RUE and RLE    Transfers Overall transfer level: Needs assistance Equipment used: Ambulation equipment used Transfers: Sit to/from Stand Sit to Stand: Mod assist, +2 physical assistance           General transfer comment: mod assist +2 to stand from EOB and patient able to stand from [facility] with min to CGA of 2 for safety.  Increased assistance required as patient became more fatigued     Balance Overall balance assessment: Needs assistance Sitting-balance support: Single extremity supported, Feet supported Sitting balance-Leahy Scale: Poor Sitting balance - Comments: mod assist due to pushes to LUE.  Lateral leaning performed to address Postural control: Posterior lean, Right lateral lean Standing balance support: Bilateral upper extremity supported, During functional activity Standing balance-Leahy Scale: Poor Standing balance comment: reliant on Stedy and therapist to stand                           ADL either performed or assessed with clinical judgement   ADL Overall ADL's : Needs assistance/impaired     Grooming: Wash/dry face;Cueing for sequencing;Sitting Grooming Details (indicate cue type and reason): seated on EOB                                    Extremity/Trunk Assessment Upper Extremity Assessment RUE Deficits / Details: flaccid RUE Sensation: decreased proprioception RUE  Coordination: decreased fine motor;decreased gross Systems Analyst Communication Communication: Impaired Factors Affecting Communication: Difficulty expressing self;Reduced clarity of speech    Cognition Arousal: Alert Behavior During Therapy: Flat affect, Impulsive Cognition: Cognition impaired   Orientation impairments: Time, Situation Awareness: Intellectual awareness impaired Memory impairment (select all impairments): Short-term memory Attention impairment (select first level of impairment): Sustained attention Executive functioning impairment (select all impairments): Reasoning, Problem solving OT - Cognition Comments: impulsive at times with sit to stands                 Following commands: Impaired Following commands impaired: Follows one step commands inconsistently, Follows one step commands with increased time      Cueing   Cueing Techniques: Verbal cues, Tactile cues, Gestural cues, Visual cues  Exercises      Shoulder Instructions       General Comments Patient's wife present and supportive    Pertinent Vitals/ Pain       Pain Assessment Pain Assessment: No/denies pain Pain Intervention(s): Monitored during session  Home Living                                          Prior Functioning/Environment              Frequency  Min 2X/week        Progress Toward Goals  OT Goals(current goals can now be found in the care plan section)  Progress towards OT goals: Progressing toward goals  Acute Rehab OT Goals Patient Stated Goal: to get some rest OT Goal Formulation: With patient Time For Goal Achievement: 09/07/24 Potential to Achieve Goals: Fair ADL Goals Pt Will Perform Grooming: with min assist;sitting Pt Will Perform Upper Body Dressing: with min assist;sitting Pt Will Perform Lower Body Dressing: with max assist;sit to/from stand Pt Will Transfer to Toilet: with max assist;stand pivot transfer;bedside commode Pt/caregiver will Perform Home Exercise Program: Increased ROM;Increased strength;Right Upper extremity;With minimal assist Additional ADL Goal #1: Pt will complete bed mobility with mod A as a  precursor to ADLs  Plan      Co-evaluation    PT/OT/SLP Co-Evaluation/Treatment: Yes Reason for Co-Treatment: For patient/therapist safety;To address functional/ADL transfers PT goals addressed during session: Strengthening/ROM;Balance;Mobility/safety with mobility OT goals addressed during session: ADL's and self-care      AM-PAC OT 6 Clicks Daily Activity     Outcome Measure   Help from another person eating meals?: A Lot Help from another person taking care of personal grooming?: A Little Help from another person toileting, which includes using toliet, bedpan, or urinal?: Total Help from another person bathing (including washing, rinsing, drying)?: A Lot Help from another person to put on and taking off regular upper body clothing?: A Lot Help from another person to put on and taking off regular lower body clothing?: Total 6 Click Score: 11    End of Session Equipment Utilized During Treatment: Gait belt;Other (comment) Laurent)  OT Visit Diagnosis: Unsteadiness on feet (R26.81);Other abnormalities of gait and mobility (R26.89);Muscle weakness (generalized) (M62.81);Hemiplegia and hemiparesis Hemiplegia - Right/Left: Right Hemiplegia - dominant/non-dominant: Dominant Hemiplegia - caused by: Other Nontraumatic intracranial hemorrhage   Activity Tolerance Patient tolerated treatment well   Patient Left in bed;with call bell/phone within reach;with  bed alarm set;with family/visitor present   Nurse Communication Mobility status        Time: 9046-8982 OT Time Calculation (min): 24 min  Charges: OT General Charges $OT Visit: 1 Visit OT Treatments $Therapeutic Activity: 8-22 mins  Dick Laine, OTA Acute Rehabilitation Services  Office [phone]   Jeb LITTIE Laine 09/03/2024, 12:25 PM

## 2024-09-03 NOTE — Progress Notes (Signed)
 Speech Language Pathology Treatment: Dysphagia  Patient Details Name: Alec [name] MRN: [id number] DOB: [birth year] Today's Date: 09/03/2024 Time: 8876-8854 SLP Time Calculation (min) (ACUTE ONLY): 22 min  Assessment / Plan / Recommendation Clinical Impression  Session was spent reviewing dysphagia/aspiration precautions with pt and his wife and assisting with lunch meal. Pt is preparing for D/C to [facility] rehab today.  He is able to feed himself with LUE. Requires nearly constant cues to slow his pace and monitor for pocketing of solids on the right side of his mouth.  Cues additionally needed to take small sips of nectar thick liquid.  Pt's swallowing recovery has been slow.  His ability to protect airway has fluctuated and per last MBS he  demonstrated both silent and sensed aspiration of nectar and thin liquids (intermittent; smaller sips were less likely to be aspirated)  He has not had any reason to repeat chest imaging (last was 11/20) and breath sounds have been stable, so he does not appear to be developing signs of a recurring pna despite frequent coughing with PO intake.  He has significant sensorimotor deficits on the right, impacting speech and swallowing  D/C recommendations:  1) Continue mechanical soft solids with nectar thick liquids during meals. 2) Full supervision with eating to ensure small sips and bites; checking for pocketing on right. 3) Continue water  protocol between meals - provide oral care; pt may drink water  despite coughing. 4) Return as OP for MBS in 4-6 weeks 5) Feel free to contact acute care SLP in rehab services dept - [phone]    HPI HPI: Alec [name] [facility]) is a 78 y.o. male who presented 11/11 with right-sided weakness and aphasia. Head CT with large L hemorrage. CT 11/12: Interval increase in size of left basal ganglia hemorrhage, now measuring 3.2 x 3.6 x 2.9 cm (estimated volume 17 mL, previously 11 mL). Mild increase in surrounding  edema without significant midline shift. Pt became obtunded on 11/18 and was intubated and transferred to ICU. Pt with hx of diabetes, hypertension, CAD, CHF, RA, COPD.  Patient underwent MBS on 11/24 with recs for pure and honey thick liquid via teaspoon. Repeat MBS 12/2 with recs for mechanical soft, nectar thick liquids.      SLP Plan  Discharge SLP treatment due to (comment)        Swallow Evaluation Recommendations   Recommendations: PO diet PO Diet Recommendation: Dysphagia 3 (Mechanical soft);Mildly thick liquids (Level 2, nectar thick) Liquid Administration via: Cup Medication Administration: Whole meds with puree Supervision: Full supervision/cueing for swallowing strategies Postural changes: Position pt fully upright for meals;Stay upright 30-60 min after meals Oral care recommendations: Oral care QID (4x/day);Oral care before ice chips/water      Recommendations                     Oral care prior to ice chip/H20   Frequent or constant Supervision/Assistance Dysphagia, oropharyngeal phase (R13.12)     Discharge SLP treatment due to (comment)   Azuri Bozard L. Vona, MA CCC/SLP Clinical Specialist - Acute Care SLP Acute Rehabilitation Services Office number [phone]   Vona Palma Laurice  09/03/2024, 11:46 AM

## 2024-09-03 NOTE — Discharge Summary (Addendum)
 Physician Discharge Summary   Patient: Alec [name] MRN: [id number] DOB: 08-27-1946  Admit date:     08/10/2024  Discharge date: 09/04/24  Discharge Physician: Sabas GORMAN Brod   PCP: Loreli Kins, MD   Recommendations at discharge:   Follow-up nephrology in 2 weeks Do not take aspirin  or Plavix due to intracerebral hemorrhage Hydralazine  25 mg p.o. every 6 hours as needed for blood pressure more than 160/100  Discharge Diagnoses: Principal Problem:   ICH (intracerebral hemorrhage) (HCC) Active Problems:   Acute respiratory failure with hypoxia (HCC)   Hypertension   CKD (chronic kidney disease), stage IV (HCC)   Uremic encephalopathy   Type 2 diabetes mellitus with chronic kidney disease, with long-term current use of insulin  (HCC)   Aspiration pneumonia (HCC)   COPD (chronic obstructive pulmonary disease) (HCC)   Dysphagia   Hypernatremia   Metabolic acidosis   Hyperlipidemia   Pressure injury of skin  Resolved Problems:   * No resolved hospital problems. [facility] Course: [phone] with hx DM, HTN, CAD, CHF, RA, COPD, and chronic pancytopenia who initially presented on 11/11 with R sided weakness and aphasia, found to have L sided subcortical ICH suspected due to uncontrolled hypertension. Hospital course complicated with acute encephalopathy, dysphagia on modified diet as per SLP recommendations, aspiration pneumonia treated with antibiotic course, acute respiratory failure requiring mechanical ventilation then extubated on 11/21, AKI on CKD 4 now at new baseline, hypernatremia that has been recurrent in nature due to poor oral hydration resolved afternoon 12/2 with IV hydration, and hyperkalemia resolved with improvement in renal function. Need to encourage oral intake and monitor Na for stability.    Assessment and Plan:  Acute encephalopathy Likely in the setting of ICH  -Encephalopathy seems to have resolved -Continue Seroquel  at bedtime   Nonsustained V. Tach -  Had 21 beat nonsustained V. tach  - Improved after starting on Coreg  6.25 mg p.o. twice daily   Hypernatremia -Serum sodium is 143 today -Improved; D5W has been stopped -Started on dysphagia 3 diet with nectar thick liquid       CKD (chronic kidney disease), stage IV (HCC) -Developed AKI on underlying CKD stage IV during hospitalization -Nephrology consulted; not a good candidate for long-term hemodialysis -Creatinine is 3.98, close to baseline -Will follow-up with nephrology as outpatient  Metabolic acidosis -Continue sodium bicarb tablets 650 mg p.o. 3 times daily   ICH (intracerebral hemorrhage) (HCC) Stable with midline shift, unlikely secondary to uncontrolled hypertension. -Continue holding antiplatelets. - cont statin - PT recommend SNF   Acute respiratory failure with hypoxia (HCC) -Resolved Patient developed acute hypoxic respiratory failure requiring intubation, extubated on 11/21 and now weaned back to room air. -Continue to monitor -Supplemental oxygen as needed   Hypertension Blood pressure better controlled - Started on Coreg  6.25 mg p.o. twice daily as above - Continue amlodipine  at low-dose of 5 mg daily - Started on hydralazine  25 mg p.o. every 6 hours as needed for BP greater than 160/100    Type 2 diabetes mellitus with chronic kidney disease, with long-term current use of insulin  (HCC) CBG currently within goal. - Continue Lantus , sliding cell insulin  NovoLog    Aspiration pneumonia (HCC) Patient was started on Unasyn  in ICU for concern of aspiration pneumonia. Finished Unasyn  7-day course on 11/24      Dysphagia Improving --Underwent modified barium swallow today -Started on dysphagia 3 diet with nectar thick liquid   Dyspepsia - postprantial belching and heartburn - start pantoprazole  trial -  start maalox PRN     Hyperlipidemia - Continue with Lipitor   Gout flare -complained of shoulder pain on right. Xray right shoulder showed mild  arthritis. Improved with prednisone  . -Can use prednisone  short course if needed      Consultants: Nephrology, neurology Procedures performed:  Disposition: Home Diet recommendation:  Discharge Diet Orders (From admission, onward)     Start     Ordered   09/03/24 0000  Diet - low sodium heart healthy        09/03/24 1128           Regular diet DISCHARGE MEDICATION: Allergies as of 09/04/2024   No Active Allergies      Medication List     STOP taking these medications    aspirin  EC 81 MG tablet   CINNAMON PO   furosemide  20 MG tablet Commonly known as: LASIX    insulin  pump Soln   olmesartan  5 MG tablet Commonly known as: BENICAR    OSTEO BI-FLEX ONE PER DAY PO   prednisoLONE acetate 1 % ophthalmic suspension Commonly known as: PRED FORTE       TAKE these medications    albuterol  108 (90 Base) MCG/ACT inhaler Commonly known as: VENTOLIN  HFA Inhale 2 puffs into the lungs every 6 (six) hours as needed for wheezing or shortness of breath.   allopurinol  100 MG tablet Commonly known as: ZYLOPRIM  Take 0.5 tablets (50 mg total) by mouth daily.   amLODipine  5 MG tablet Commonly known as: NORVASC  Take 1 tablet (5 mg total) by mouth daily.   atorvastatin  40 MG tablet Commonly known as: LIPITOR TAKE 1 TABLET BY MOUTH EVERY DAY   carvedilol  6.25 MG tablet Commonly known as: COREG  Take 1 tablet (6.25 mg total) by mouth 2 (two) times daily with a meal.   CENTRUM SILVER PO Take 1 tablet by mouth daily.   gabapentin  100 MG capsule Commonly known as: Neurontin  Take 1 capsule (100 mg total) by mouth 3 (three) times daily as needed (pain). What changed: when to take this   hydrALAZINE  25 MG tablet Commonly known as: APRESOLINE  Take 1 tablet (25 mg total) by mouth every 8 (eight) hours as needed (BP> 160/100). What changed:  medication strength how much to take when to take this reasons to take this   insulin  aspart 100 UNIT/ML injection Commonly  known as: novoLOG  Inject 0-15 Units into the skin 3 (three) times daily with meals. Sliding scale insulin  Less than 70 initiate hypoglycemia protocol 70-120  0 units 120-150 2unit 151-200 3 units 201-250 5units 251-300 8 units 301-350 11 units 351-400 15 units  Greater than 400 call MD What changed:  how much to take how to take this when to take this additional instructions   insulin  glargine-yfgn 100 UNIT/ML injection Commonly known as: SEMGLEE  Inject 0.28 mLs (28 Units total) into the skin daily.   isosorbide  mononitrate 120 MG 24 hr tablet Commonly known as: IMDUR  TAKE 1 TABLET BY MOUTH EVERY DAY   latanoprost  0.005 % ophthalmic solution Commonly known as: XALATAN  Place 1 drop into both eyes at bedtime.   nitroGLYCERIN  0.4 MG SL tablet Commonly known as: NITROSTAT  Place 1 tablet (0.4 mg total) under the tongue every 5 (five) minutes as needed for chest pain.   pantoprazole  40 MG tablet Commonly known as: PROTONIX  Take 1 tablet (40 mg total) by mouth daily.   QUEtiapine  25 MG tablet Commonly known as: SEROQUEL  Take 1 tablet (25 mg total) by mouth at bedtime.  senna-docusate 8.6-50 MG tablet Commonly known as: Senokot-S Take 1 tablet by mouth 2 (two) times daily.   sertraline  100 MG tablet Commonly known as: ZOLOFT  TAKE 1 TABLET BY MOUTH EVERY DAY What changed: how much to take   sodium bicarbonate  650 MG tablet Take 1 tablet (650 mg total) by mouth 3 (three) times daily.   tamsulosin  0.4 MG Caps capsule Commonly known as: FLOMAX  Take 0.4 mg by mouth at bedtime.   Vitamin D3 50 MCG (2000 UT) Tabs Take 1 tablet by mouth daily.        Contact information for follow-up providers     Macel Jayson PARAS, MD Follow up in 2 week(s).   Specialty: Nephrology Contact information: [address] [place] [address] [phone]              Contact information for after-discharge care     Destination     [facility] .   Service: Skilled  Nursing Contact information: [address] [place]  [address] [phone]                    Discharge Exam: Filed Weights   08/31/24 0500 09/01/24 0500 09/03/24 0500  Weight: 85 kg 85.4 kg 85.6 kg   General-appears in no acute distress Heart-S1-S2, regular, no murmur auscultated Lungs-clear to auscultation bilaterally, no wheezing or crackles auscultated Abdomen-soft, nontender, no organomegaly Extremities-no edema in the lower extremities Neuro-alert, oriented x3, no focal deficit noted  Condition at discharge: good  The results of significant diagnostics from this hospitalization (including imaging, microbiology, ancillary and laboratory) are listed below for reference.   Imaging Studies: DG Shoulder 1V Right Result Date: 09/03/2024 EXAM: 1 VIEW(S) XRAY OF THE RIGHT SHOULDER 09/03/2024 03:56:09 PM COMPARISON: None available. CLINICAL HISTORY: Shoulder pain. FINDINGS: BONES AND JOINTS: The glenohumeral joint is maintained. Degenerative in the right [facility] joint with joint space narrowing and spurring. No acute fracture. No malalignment. SOFT TISSUES: No abnormal calcifications. Visualized lung is unremarkable. IMPRESSION: 1. Right acromioclavicular joint osteoarthritis. 2. No acute bony abnormality. Electronically signed by: Franky Crease MD 09/03/2024 07:30 PM EST RP Workstation: [id number]   DG Swallowing Func-Speech Pathology Result Date: 09/01/2024 Table formatting from the original result was not included. Modified Barium Swallow Study Patient Details Name: Alec [name] MRN: [id number] Date of Birth: 03-23-1946 Today's Date: 09/01/2024 HPI/PMH: HPI: Alec [name] [name] is a 78 y.o. male who presented 11/11 with right-sided weakness and aphasia. Head CT with large L hemorrage. CT 11/12: Interval increase in size of left basal ganglia hemorrhage, now measuring 3.2 x 3.6 x 2.9 cm (estimated volume 17 mL, previously 11 mL). Mild increase in surrounding  edema without significant midline shift. Pt became obtunded on 11/18 and was intubated and transferred to ICU. Pt with hx of diabetes, hypertension, CAD, CHF, RA, COPD.  Patient underwent MBS on 1124 and was advised to have pure and honey thick liquid via teaspoon.  SlP seen for follow up for dysphagia and dysarthria, cognitive management.  Repeat MBS indicated to determine readiness for diet advancement as intake is poor. Clinical Impression: Clinical Impression: Patient continues with moderate oropharyngeal dysphagia with marginal improvement from previous study. Oral phase remains delayed and is discoordinated - allowing spillage of boluses into pharynx. Continued largest variable that contributes to aspiration is a delay in swallow initiation and decreased laryngeal closure allowing penetration of thin both prior to and during the swallow. Chin tuck posture not helpful to prevent aspiration of thin as it  allowed bolus to spill into open airway.  SMALLER bolus size of nectar was tolerated without aspiration nor deep penetration.  Solids were masticated and orally transited but were noted to pool at tongue base with delayed reflexive swallow.  No aspiration or penetration of solid/puree/honey thick noted. Pharyngeal strength largely intact with min retention at vallecula with increased viscosity and at pyriform with liquid noted.  Counting 1,2,3. to elicit swallow was not helpful - however SMALL BOLUSES provides optimal airway protection.  HOB reclined to approx. 45* was helpful with thin liquids - as it allowed pooled boluses to be retained - but may worsen airway protection with solids- thus recommend to maintain HOB upright.   In light of VERY POOR po intake impacting nutrition and hydration and in discussion with MD via secure chat - diet was advanced to dys3/nectar - providing puree bolus to trigger pharyngeal swallow of masticated solids and allowing teaspoons of thin liquids. Will follow up for dysphagia  management including providing RMST to improve cough strength as well as laryngeal closure. Factors that may increase risk of adverse event in presence of aspiration Noe & Lianne 2021): Factors that may increase risk of adverse event in presence of aspiration Noe & Lianne 2021): Limited mobility; Dependence for feeding and/or oral hygiene; Weak cough Recommendations/Plan: Swallowing Evaluation Recommendations Swallowing Evaluation Recommendations Recommendations: PO diet PO Diet Recommendation: Dysphagia 3 (Mechanical soft); Mildly thick liquids (Level 2, nectar thick) (tsps of thin ok any time) Liquid Administration via: Cup; Straw; Other (Comment) Medication Administration: Other (Comment) (consider with nectar or icecream) Supervision: Full assist for feeding Swallowing strategies  : Small bites/sips; Slow rate; Check for pocketing or oral holding; Minimize environmental distractions (use puree to trigger swallow with masticated solids!) Postural changes: Position pt fully upright for meals; Stay upright 30-60 min after meals Oral care recommendations: Oral care QID (4x/day) Caregiver Recommendations: Have oral suction available Treatment Plan Treatment Plan Treatment recommendations: Therapy as outlined in treatment plan below Follow-up recommendations: Skilled nursing-short term rehab (<3 hours/day) Functional status assessment: Patient has had a recent decline in their functional status and/or demonstrates limited ability to make significant improvements in function in a reasonable and predictable amount of time. Treatment frequency: Min 2x/week Treatment duration: 2 weeks Interventions: Aspiration precaution training; Compensatory techniques; Patient/family education; Respiratory muscle strength training; Diet toleration management by SLP; Trials of upgraded texture/liquids; Oropharyngeal exercises Recommendations Recommendations for follow up therapy are one component of a multi-disciplinary  discharge planning process, led by the attending physician.  Recommendations may be updated based on patient status, additional functional criteria and insurance authorization. Assessment: Orofacial Exam: Orofacial Exam Oral Cavity: Oral Hygiene: WFL Oral Cavity - Dentition: Adequate natural dentition Oral Motor/Sensory Function: Suspected cranial nerve impairment CN V - Trigeminal: Right motor impairment CN VII - Facial: Right motor impairment CN IX - Glossopharyngeal, CN X - Vagus: Right motor impairment CN XII - Hypoglossal: Right motor impairment Anatomy: Anatomy: WFL Boluses Administered: Boluses Administered Boluses Administered: Thin liquids (Level 0); Mildly thick liquids (Level 2, nectar thick); Solid; Puree; Moderately thick liquids (Level 3, honey thick)  Oral Impairment Domain: Oral Impairment Domain Lip Closure: Escape progressing to mid-chin Tongue control during bolus hold: Posterior escape of less than half of bolus; Posterior escape of greater than half of bolus Bolus preparation/mastication: Slow prolonged chewing/mashing with complete recollection Bolus transport/lingual motion: Slow tongue motion Oral residue: Residue collection on oral structures Location of oral residue : Tongue Initiation of pharyngeal swallow : Pyriform sinuses; Posterior laryngeal surface  of the epiglottis; Valleculae  Pharyngeal Impairment Domain: Pharyngeal Impairment Domain Soft palate elevation: No bolus between soft palate (SP)/pharyngeal wall (PW) Laryngeal elevation: Partial superior movement of thyroid  cartilage/partial approximation of arytenoids to epiglottic petiole Anterior hyoid excursion: Complete anterior movement Epiglottic movement: Complete inversion Laryngeal vestibule closure: Incomplete, narrow column air/contrast in laryngeal vestibule Pharyngeal contraction (A/P view only): N/A Pharyngoesophageal segment opening: Complete distension and complete duration, no obstruction of flow Tongue base retraction:  Narrow column of contrast or air between tongue base and PPW Pharyngeal residue: Trace residue within or on pharyngeal structures Location of pharyngeal residue: Tongue base; Pyriform sinuses; Aryepiglottic folds; Valleculae; Diffuse (>3 areas)  Esophageal Impairment Domain: Esophageal Impairment Domain Esophageal clearance upright position: Complete clearance, esophageal coating Pill: Pill Consistency administered: Mildly thick liquids (Level 2, nectar thick) Mildly thick liquids (Level 2, nectar thick): WFL Penetration/Aspiration Scale Score: Penetration/Aspiration Scale Score 1.  Material does not enter airway: Moderately thick liquids (Level 3, honey thick); Puree; Solid; Pill 5.  Material enters airway, CONTACTS cords and not ejected out: Mildly thick liquids (Level 2, nectar thick) 8.  Material enters airway, passes BELOW cords without attempt by patient to eject out (silent aspiration) : Thin liquids (Level 0) Compensatory Strategies: Compensatory Strategies Compensatory strategies: Yes Chin tuck: Ineffective Ineffective Chin Tuck: Thin liquid (Level 0) Reclining posture: Effective Effective Reclining Posture: Thin liquid (Level 0) Other(comment): Effective; Ineffective Effective Other(comment): -- (counting 1,2,3... to initiate swallow - not effective; using puree to trigger swallow-effective)   General Information: Caregiver present: No  Diet Prior to this Study: Dysphagia 1 (pureed); Moderately thick liquids (Level 3, honey thick)   Temperature : Normal   Respiratory Status: WFL   Supplemental O2: None (Room air)   History of Recent Intubation: No  Behavior/Cognition: Alert; Cooperative Self-Feeding Abilities: Dependent for feeding (narrowed space for self feeding in xray) Baseline vocal quality/speech: Hypophonia/low volume Volitional Cough: Able to elicit Volitional Swallow: Unable to elicit Exam Limitations: No limitations Goal Planning: Prognosis for improved oropharyngeal function: Good No data  recorded No data recorded Patient/Family Stated Goal: wants better food No data recorded Pain: Pain Assessment Pain Assessment: No/denies pain End of Session: Start Time:SLP Start Time (ACUTE ONLY): 0840 Stop Time: SLP Stop Time (ACUTE ONLY): 0914 Time Calculation:SLP Time Calculation (min) (ACUTE ONLY): 34 min Charges: SLP Evaluations $ SLP Speech Visit: 1 Visit SLP Evaluations $MBS Swallow: 1 Procedure $Swallowing Treatment: 1 Procedure SLP visit diagnosis: SLP Visit Diagnosis: Dysphagia, oropharyngeal phase (R13.12) Past Medical History: Past Medical History: Diagnosis Date  Anemia   Anxiety   Aortic insufficiency   Arthritis   Bilateral lower extremity edema   Chronic gout   06-19-2020  per pt last episode 6 months , great toe  CKD (chronic kidney disease), stage IV (HCC)   Coronary artery disease cardiologist--- dr hochrein  CABG 2019  Degenerative arthritis of shoulder region 08/2013  left  History of bladder cancer 07/2008  s/p  TURBT  History of cellulitis 11/2017  left upper arm  History of kidney stones   Hyperlipidemia   Hypertension   followed by pcp/ cardiology  IDDM (insulin  dependent diabetes mellitus)   endocrinologist--- dr von---  pt uses insulin  pump and dexcom  (06-19-2020 per pt fasting sugar-- 98--110)  Insulin  pump in place   Mitral regurgitation   OSA (obstructive sleep apnea)   Prostate cancer [facility]) urologist--- dr herrick/  oncologist--- manning  dx 11/ 2017  Gleason 3+3 active survillance;  bx 03-14-2020 Gleason 3+4  RA (rheumatoid arthritis) (  HCC)   rhemotologist--- dr donnis---    S/P CABG x 3 07/15/2018  LIMA to LAD;  SVG to PDA;  SVG to RI  Thrombocytopenia  Past Surgical History: Past Surgical History: Procedure Laterality Date  CHOLECYSTECTOMY  08/29/2012  Procedure: LAPAROSCOPIC CHOLECYSTECTOMY WITH INTRAOPERATIVE CHOLANGIOGRAM;  Surgeon: Donnice POUR. Tsuei, MD;  Location: [facility]S;  Service: General;  Laterality: N/A;  CORONARY ARTERY BYPASS GRAFT N/A 07/20/2018  Procedure:  CORONARY ARTERY BYPASS GRAFTING (CABG) times three using left internal mammary artery to the LAD, and using endoscopically harvested right saphenous vein to PDA and intermedius.;  Surgeon: Army Dallas NOVAK, MD;  Location: [facility] OR;  Service: Open Heart Surgery;  Laterality: N/A;  LEFT HEART CATH AND CORONARY ANGIOGRAPHY N/A 07/15/2018  Procedure: LEFT HEART CATH AND CORONARY ANGIOGRAPHY;  Surgeon: Anner Alm ORN, MD;  Location: [facility] INVASIVE CV LAB;  Service: Cardiovascular;  Laterality: N/A;  RADIOACTIVE SEED IMPLANT N/A 06/21/2020  Procedure: RADIOACTIVE SEED IMPLANT/BRACHYTHERAPY IMPLANT;  Surgeon: Cam Morene ORN, MD;  Location: [facility];  Service: Urology;  Laterality: N/A;  SHOULDER ARTHROSCOPY W/ ROTATOR CUFF REPAIR Right 09/03/2013  SHOULDER ARTHROSCOPY WITH SUBACROMIAL DECOMPRESSION, ROTATOR CUFF REPAIR AND BICEP TENDON REPAIR Left 09/03/2013  Procedure: LEFT SHOULDER ARTHROSCOPY WITH EXTENSIVE DEBRIDMENT, DISTAL CLAVICULECTOMY, ROTATOR CUFF REPAIR AND SUBACROMIAL DECOMPRESSION PARTIAL ACRIOMIOPLASTY WITH CORACOACROMIAL RELEASE;  Surgeon: Evalene JONETTA Chancy, MD;  Location: [facility];  Service: Orthopedics;  Laterality: Left;  SPACE OAR INSTILLATION N/A 06/21/2020  Procedure: SPACE OAR INSTILLATION;  Surgeon: Cam Morene ORN, MD;  Location: [facility];  Service: Urology;  Laterality: N/A;  TEE WITHOUT CARDIOVERSION N/A 07/20/2018  Procedure: TRANSESOPHAGEAL ECHOCARDIOGRAM (TEE);  Surgeon: Army Dallas NOVAK, MD;  Location: [facility] OR;  Service: Open Heart Surgery;  Laterality: N/A;  TRANSURETHRAL RESECTION OF BLADDER TUMOR WITH MITOMYCIN -C  08/24/2008   @[email or web address]  Madelin POUR, MS [facility] SLP Acute Rehab Services Office [phone] Nicolas Emmie Caldron 09/01/2024, 10:52 AM  DG Swallowing Func-Speech Pathology Result Date: 08/23/2024 Table formatting from the original result was not included. Modified Barium Swallow Study Patient Details Name: Alec [name] MRN: [id number]  Date of Birth: 09/08/1946 Today's Date: 08/23/2024 HPI/PMH: HPI: Alec [name] [name] is a 78 y.o. male who presented 11/11 with right-sided weakness and aphasia. Head CT with large L hemorrage.   CT 11/12: Interval increase in size of left basal ganglia hemorrhage, now  measuring 3.2 x 3.6 x 2.9 cm (estimated volume 17 mL, previously 11  mL). Mild increase in surrounding edema without significant midline  shift. Pt became obtunded on 11/18 and was intubated and transferred to ICU.  Pt with hx of diabetes, hypertension, CAD, CHF, RA, COPD Clinical Impression: Clinical Impression: Patient presents with a moderate oropharyngeal dysphagia with improvement from previous study. Oral phase remains delayed but functional. Largest contributing factor to aspiration is a delay in swallow initiation, to the pyriform sinuses with consistencies thinner than puree, with resultant deep penetration and/or aspiration of both nectar thick and thin liquids despite varying presentation. Pharyngeal strength largely intact with only occassional trace pyriform sinus residue present post swallow. Current cognitive-linguistic status does not allow for carryout of compensatory strategies beyond controlling bolus size and presentation. Recommend initiation of pureed solids with honey thick liquids via tsp. Fluctuations in mental status will increase aspiration risk and may impact overall intake. SLP will f/u closely. Factors that may increase risk of adverse event in presence of aspiration Noe & Lianne 2021): Factors that may increase risk of adverse event  in presence of aspiration Noe & Lianne 2021): Reduced cognitive function; Dependence for feeding and/or oral hygiene Recommendations/Plan: Swallowing Evaluation Recommendations Swallowing Evaluation Recommendations Recommendations: PO diet PO Diet Recommendation: Dysphagia 1 (Pureed); Moderately thick liquids (Level 3, honey thick) Liquid Administration via: Spoon Medication  Administration: Crushed with puree Supervision: Staff to assist with self-feeding; Full assist for feeding Swallowing strategies  : Slow rate; Small bites/sips Postural changes: Position pt fully upright for meals Oral care recommendations: Oral care BID (2x/day) Caregiver Recommendations: Avoid jello, ice cream, thin soups, popsicles; Remove water  pitcher; Have oral suction available Treatment Plan Treatment Plan Treatment recommendations: Therapy as outlined in treatment plan below Follow-up recommendations: Acute inpatient rehab (3 hours/day) Functional status assessment: Patient has had a recent decline in their functional status and demonstrates the ability to make significant improvements in function in a reasonable and predictable amount of time. Treatment frequency: Min 3x/week Treatment duration: 2 weeks Interventions: Aspiration precaution training; Compensatory techniques; Patient/family education; Trials of upgraded texture/liquids; Diet toleration management by SLP Recommendations Recommendations for follow up therapy are one component of a multi-disciplinary discharge planning process, led by the attending physician.  Recommendations may be updated based on patient status, additional functional criteria and insurance authorization. Assessment: Orofacial Exam: Orofacial Exam Oral Cavity: Oral Hygiene: WFL Oral Cavity - Dentition: Adequate natural dentition Oral Motor/Sensory Function: Suspected cranial nerve impairment CN V - Trigeminal: Right motor impairment CN VII - Facial: Right motor impairment CN IX - Glossopharyngeal, CN X - Vagus: Not tested CN XII - Hypoglossal: Not tested Anatomy: Anatomy: WFL Boluses Administered: Boluses Administered Boluses Administered: Thin liquids (Level 0); Mildly thick liquids (Level 2, nectar thick); Moderately thick liquids (Level 3, honey thick); Puree; Solid  Oral Impairment Domain: Oral Impairment Domain Lip Closure: Escape from interlabial space or lateral  juncture, no extension beyond vermillion border Tongue control during bolus hold: Escape to lateral buccal cavity/floor of mouth Bolus preparation/mastication: Slow prolonged chewing/mashing with complete recollection Bolus transport/lingual motion: Delayed initiation of tongue motion (oral holding) Oral residue: Trace residue lining oral structures Location of oral residue : Floor of mouth; Tongue Initiation of pharyngeal swallow : Pyriform sinuses  Pharyngeal Impairment Domain: Pharyngeal Impairment Domain Soft palate elevation: No bolus between soft palate (SP)/pharyngeal wall (PW) Laryngeal elevation: Complete superior movement of thyroid  cartilage with complete approximation of arytenoids to epiglottic petiole Anterior hyoid excursion: Partial anterior movement Epiglottic movement: Complete inversion Laryngeal vestibule closure: Incomplete, narrow column air/contrast in laryngeal vestibule Pharyngeal stripping wave : Present - complete Pharyngeal contraction (A/P view only): N/A Pharyngoesophageal segment opening: Complete distension and complete duration, no obstruction of flow Tongue base retraction: Trace column of contrast or air between tongue base and PPW Pharyngeal residue: Trace residue within or on pharyngeal structures Location of pharyngeal residue: Pyriform sinuses  Esophageal Impairment Domain: Esophageal Impairment Domain Esophageal clearance upright position: Complete clearance, esophageal coating Pill: Pill Consistency administered: -- (NT) Penetration/Aspiration Scale Score: Penetration/Aspiration Scale Score 1.  Material does not enter airway: Puree; Solid; Moderately thick liquids (Level 3, honey thick) 2.  Material enters airway, remains ABOVE vocal cords then ejected out: Mildly thick liquids (Level 2, nectar thick) 5.  Material enters airway, CONTACTS cords and not ejected out: Mildly thick liquids (Level 2, nectar thick) 7.  Material enters airway, passes BELOW cords and not ejected out  despite cough attempt by patient: Thin liquids (Level 0); Mildly thick liquids (Level 2, nectar thick) 8.  Material enters airway, passes BELOW cords without attempt by patient to eject out (  silent aspiration) : Mildly thick liquids (Level 2, nectar thick) Compensatory Strategies: Compensatory Strategies Compensatory strategies: No (patient unable cognitively)   General Information: Caregiver present: No  Diet Prior to this Study: NPO   Temperature : Normal   Respiratory Status: WFL   Supplemental O2: None (Room air)   History of Recent Intubation: No  Behavior/Cognition: Alert; Distractible; Requires cueing Self-Feeding Abilities: Able to self-feed; Needs assist with self-feeding Baseline vocal quality/speech: Normal Volitional Cough: Unable to elicit Volitional Swallow: Unable to elicit No data recorded Goal Planning: Prognosis for improved oropharyngeal function: Good Barriers to Reach Goals: Severity of deficits No data recorded No data recorded Consulted and agree with results and recommendations: Pt unable/family or caregiver not available; Nurse Pain: Pain Assessment Pain Assessment: No/denies pain Breathing: 0 Negative Vocalization: 0 Facial Expression: 0 Body Language: 0 Consolability: 0 PAINAD Score: 0 End of Session: Start Time:SLP Start Time (ACUTE ONLY): 1208 Stop Time: SLP Stop Time (ACUTE ONLY): 1230 Time Calculation:SLP Time Calculation (min) (ACUTE ONLY): 22 min Charges: SLP Evaluations $ SLP Speech Visit: 1 Visit SLP Evaluations $MBS Swallow: 1 Procedure $Swallowing Treatment: 1 Procedure SLP visit diagnosis: SLP Visit Diagnosis: Dysphagia, oropharyngeal phase (R13.12) Past Medical History: Past Medical History: Diagnosis Date  Anemia   Anxiety   Aortic insufficiency   Arthritis   Bilateral lower extremity edema   Chronic gout   06-19-2020  per pt last episode 6 months , great toe  CKD (chronic kidney disease), stage IV (HCC)   Coronary artery disease cardiologist--- dr hochrein  CABG 2019   Degenerative arthritis of shoulder region 08/2013  left  History of bladder cancer 07/2008  s/p  TURBT  History of cellulitis 11/2017  left upper arm  History of kidney stones   Hyperlipidemia   Hypertension   followed by pcp/ cardiology  IDDM (insulin  dependent diabetes mellitus)   endocrinologist--- dr von---  pt uses insulin  pump and dexcom  (06-19-2020 per pt fasting sugar-- 98--110)  Insulin  pump in place   Mitral regurgitation   OSA (obstructive sleep apnea)   Prostate cancer [facility]) urologist--- dr herrick/  oncologist--- [facility]  dx 11/ 2017  Gleason 3+3 active survillance;  bx 03-14-2020 Gleason 3+4  RA (rheumatoid arthritis) (HCC)   rhemotologist--- dr donnis---    S/P CABG x 3 07/15/2018  LIMA to LAD;  SVG to PDA;  SVG to RI  Thrombocytopenia  Past Surgical History: Past Surgical History: Procedure Laterality Date  CHOLECYSTECTOMY  08/29/2012  Procedure: LAPAROSCOPIC CHOLECYSTECTOMY WITH INTRAOPERATIVE CHOLANGIOGRAM;  Surgeon: Donnice POUR. Tsuei, MD;  Location: [facility];  Service: General;  Laterality: N/A;  CORONARY ARTERY BYPASS GRAFT N/A 07/20/2018  Procedure: CORONARY ARTERY BYPASS GRAFTING (CABG) times three using left internal mammary artery to the LAD, and using endoscopically harvested right saphenous vein to PDA and intermedius.;  Surgeon: Army Dallas NOVAK, MD;  Location: [facility] OR;  Service: Open Heart Surgery;  Laterality: N/A;  LEFT HEART CATH AND CORONARY ANGIOGRAPHY N/A 07/15/2018  Procedure: LEFT HEART CATH AND CORONARY ANGIOGRAPHY;  Surgeon: Anner Alm ORN, MD;  Location: [facility] INVASIVE CV LAB;  Service: Cardiovascular;  Laterality: N/A;  RADIOACTIVE SEED IMPLANT N/A 06/21/2020  Procedure: RADIOACTIVE SEED IMPLANT/BRACHYTHERAPY IMPLANT;  Surgeon: Cam Morene ORN, MD;  Location: [facility];  Service: Urology;  Laterality: N/A;  SHOULDER ARTHROSCOPY W/ ROTATOR CUFF REPAIR Right 09/03/2013  SHOULDER ARTHROSCOPY WITH SUBACROMIAL DECOMPRESSION, ROTATOR CUFF REPAIR AND BICEP  TENDON REPAIR Left 09/03/2013  Procedure: LEFT SHOULDER ARTHROSCOPY WITH EXTENSIVE DEBRIDMENT, DISTAL CLAVICULECTOMY, ROTATOR  CUFF REPAIR AND SUBACROMIAL DECOMPRESSION PARTIAL ACRIOMIOPLASTY WITH CORACOACROMIAL RELEASE;  Surgeon: Evalene JONETTA Chancy, MD;  Location: [facility];  Service: Orthopedics;  Laterality: Left;  SPACE OAR INSTILLATION N/A 06/21/2020  Procedure: SPACE OAR INSTILLATION;  Surgeon: Cam Morene ORN, MD;  Location: [facility];  Service: Urology;  Laterality: N/A;  TEE WITHOUT CARDIOVERSION N/A 07/20/2018  Procedure: TRANSESOPHAGEAL ECHOCARDIOGRAM (TEE);  Surgeon: Army Dallas NOVAK, MD;  Location: [facility] OR;  Service: Open Heart Surgery;  Laterality: N/A;  TRANSURETHRAL RESECTION OF BLADDER TUMOR WITH MITOMYCIN -C  08/24/2008   @[facility]  Leah McCoy MA, CCC-SLP McCoy Leah Meryl 08/23/2024, 1:16 PM  DG CHEST PORT 1 VIEW Result Date: 08/19/2024 EXAM: 1 VIEW(S) XRAY OF THE CHEST 08/19/2024 08:44:02 AM COMPARISON: 08/17/2024 CLINICAL HISTORY: History of ETT [id number] FINDINGS: LINES, TUBES AND DEVICES: Endotracheal tube in place with tip 2.7 cm above the carina. Enteric tube in place coursing below the hemidiaphragm with tip and side port. LUNGS AND PLEURA: Increased linear atelectasis about the right hilum. Regressed pulmonary vascularity. No focal consolidation. No pleural effusion. No pneumothorax. HEART AND MEDIASTINUM: Atherosclerotic plaque noted. No acute abnormality of the cardiac and mediastinal silhouettes. BONES AND SOFT TISSUES: Median sternotomy noted. No acute osseous abnormality. IMPRESSION: 1. Stable lines and tubes. 2. Regressed pulmonary vascularity. Increased linear atelectasis about the right hilum. 3. No other acute cardiopulmonary process. Electronically signed by: Helayne Hurst MD 08/19/2024 09:08 AM EST RP Workstation: [id number]   US  RENAL Result Date: 08/17/2024 EXAM: US  Retroperitoneum Complete, Renal. CLINICAL HISTORY: Renal failure. TECHNIQUE:  Real-time ultrasound of the retroperitoneum (complete) with image documentation. COMPARISON: US  Renal 11/23/2021. FINDINGS: RIGHT KIDNEY: Measures 10.0 x 5.2 x 4.8 cm (130.6 cc). A renal cyst is observed with suspected septation potentially up to 3 mm in thickness as on image 16 of series 1-1. The patient's renal cysts have been present since at least 2008, although the complex elements, particularly of the right renal cyst, were not readily apparent at that time. No hydronephrosis or renal stone visualized. LEFT KIDNEY: Measures 11.4 x 5.2 x 4.9 cm (150.7 cc). A left kidney upper pole cyst measures 3.2 x 3.5 x 3.2 cm with enhanced through transmission, simple in appearance. A second cyst of the left kidney lower pole measures 2.7 x 3.2 x 3.1 cm and may have subtle internal complexity/septation. No hydronephrosis or renal stone visualized. BLADDER: Moderate decompressed around foley catheter. IMPRESSION: 1. Bilateral renal cysts, with the right renal cyst demonstrating suspected septation up to 3 mm in thickness and the left lower pole cyst demonstrating subtle internal complexity/septation. These cysts have been present for at least 17 years and are likely benign despite the complexity. If clinical factors warrant further imaging workup, consider renal protocol MRI with and without contrast. Electronically signed by: Ryan Salvage MD 08/17/2024 04:31 PM EST RP Workstation: [id number]   EEG adult Result Date: 08/17/2024 Shelton Arlin KIDD, MD     08/17/2024  3:11 PM Patient Name: Alec [name] MRN: [id number] Epilepsy Attending: Arlin KIDD Shelton Referring Physician/Provider: Cindy Garnette POUR, MD Date: 08/17/2024 Duration: 22.57 mins Patient history: 78 year old male with left basal ganglia ICH.  EEG to evaluate for seizure. Level of alertness: comatose/ lethargic AEDs during EEG study: None Technical aspects: This EEG study was done with scalp electrodes positioned according to the 10-20 International system  of electrode placement. Electrical activity was reviewed with band pass filter of 1-70Hz , sensitivity of 7 uV/mm, display speed of 88mm/sec with a 60Hz  notched filter applied as appropriate.  EEG data were recorded continuously and digitally stored.  Video monitoring was available and reviewed as appropriate. Description: EEG showed continuous generalized and lateralized left hemisphere 5 to 7 Hz theta slowing admixed with intermittent 2 to 3 Hz delta slowing.  Hyperventilation and photic stimulation were not performed.   ABNORMALITY - Continuous slow, generalized and lateralized left hemisphere IMPRESSION: This study is suggestive of cortical dysfunction in left hemisphere likely secondary to underlying hemorrhage.  Additionally there is generalized cerebral dysfunction (encephalopathy). No seizures or epileptiform discharges were seen throughout the recording. Priyanka MALVA Krebs   CT HEAD WO CONTRAST ( ) Result Date: 08/17/2024 EXAM: CT HEAD WITHOUT CONTRAST 08/17/2024 11:41:00 AM TECHNIQUE: CT of the head was performed without the administration of intravenous contrast. Automated exposure control, iterative reconstruction, and/or weight based adjustment of the mA/kV was utilized to reduce the radiation dose to as low as reasonably achievable. COMPARISON: None available. CLINICAL HISTORY: Neuro deficit, acute, stroke suspected. FINDINGS: BRAIN AND VENTRICLES: There is an amorphous intraparenchymal hemorrhage again demonstrated in the left basal ganglia which appears unchanged since size and appearance in the interim. There continues to be shift of the midline structures to the right by approximately 4 mm. There are moderate vascular calcifications. No evidence of acute infarct. No hydrocephalus. No extra-axial collection. ORBITS: The patient is status post bilateral lens replacement. SINUSES: No acute abnormality. SOFT TISSUES AND SKULL: No acute soft tissue abnormality. No skull fracture. IMPRESSION: 1.  Amorphous intraparenchymal hemorrhage in the left basal ganglia, unchanged in size and appearance. 2. Continued shift of the midline structures to the right by approximately 4 mm. Electronically signed by: Evalene Coho MD 08/17/2024 11:50 AM EST RP Workstation: [id number]   Portable Chest x-ray Result Date: 08/17/2024 CLINICAL DATA:  Intubation. EXAM: PORTABLE CHEST 1 VIEW COMPARISON:  Chest CT dated 08/17/2024. FINDINGS: Endotracheal tube with tip approximately 15 mm above the carina. Recommend retraction by 3 cm for optimal positioning. Enteric tube extends below the diaphragm with tip beyond the inferior margin of the image. There is shallow inspiration. No focal consolidation, pleural effusion or pneumothorax. Mild cardiomegaly with mild central vascular congestion. Median sternotomy wires. No acute osseous pathology. IMPRESSION: 1. Endotracheal tube with tip approximately 15 mm above the carina. Recommend retraction by 3 cm for optimal positioning. 2. Mild cardiomegaly with mild central vascular congestion. Electronically Signed   By: Vanetta Chou M.D.   On: 08/17/2024 11:40   DG CHEST PORT 1 VIEW Result Date: 08/17/2024 CLINICAL DATA:  Shortness of breath.  Possible aspiration. EXAM: PORTABLE CHEST 1 VIEW COMPARISON:  04/04/2024 FINDINGS: Sternotomy wires unchanged. Enteric 2 courses into the region of the stomach and has tip in the right upper abdomen likely over the distal stomach or proximal duodenum. Lungs are hypoinflated without acute airspace consolidation or effusion. There is mild cardiomegaly. Remainder of the exam is unchanged. IMPRESSION: 1. Hypoinflation without acute cardiopulmonary disease. 2. Mild cardiomegaly. Electronically Signed   By: Toribio Agreste M.D.   On: 08/17/2024 10:27   VAS US  CAROTID Result Date: 08/15/2024 Carotid Arterial Duplex Study Patient Name:  Alec [name] [facility]  Date of Exam:   08/11/2024 Medical Rec #: [id number]       Accession #:    [id number] Date of  Birth: 09-10-1946      Patient Gender: M Patient Age:   78 years Exam [facility]  [facility] Procedure:      VAS US  CAROTID Referring Phys: ARY CUMMINS --------------------------------------------------------------------------------  Indications:  CVA. Risk Factors:      Hypertension, hyperlipidemia, Diabetes, past history of                    smoking, coronary artery disease. Other Factors:     Hx of CABG x3, CKD. Limitations        Today's exam was limited due to VERY difficult exam due to                    continuous snoring/respiratory variation and scanning                    environment (glare from sunlight). Comparison Study:  previous exam 07/16/2018 Performing Technologist: Ezzie Potters RVT, RDMS  Examination Guidelines: A complete evaluation includes B-mode imaging, spectral Doppler, color Doppler, and power Doppler as needed of all accessible portions of each vessel. Bilateral testing is considered an integral part of a complete examination. Limited examinations for reoccurring indications may be performed as noted.  Right Carotid Findings: +----------+--------+--------+--------+------------------+------------------+           PSV cm/sEDV cm/sStenosisPlaque DescriptionComments           +----------+--------+--------+--------+------------------+------------------+ CCA Prox  84      8                                                    +----------+--------+--------+--------+------------------+------------------+ CCA Distal60      12                                intimal thickening +----------+--------+--------+--------+------------------+------------------+ ICA Prox  78      14              calcific          Shadowing          +----------+--------+--------+--------+------------------+------------------+ ICA Distal62      11                                                   +----------+--------+--------+--------+------------------+------------------+ ECA        80      0               calcific                             +----------+--------+--------+--------+------------------+------------------+ +----------+--------+-------+----------------+-------------------+           PSV cm/sEDV cmsDescribe        Arm Pressure (mmHG) +----------+--------+-------+----------------+-------------------+ [id number]            Multiphasic, WNL                    +----------+--------+-------+----------------+-------------------+ +---------+--------+--+--------+-+---------+ VertebralPSV cm/s32EDV cm/s7Antegrade +---------+--------+--+--------+-+---------+  Left Carotid Findings: +----------+--------+--------+--------+------------------+------------------+           PSV cm/sEDV cm/sStenosisPlaque DescriptionComments           +----------+--------+--------+--------+------------------+------------------+ CCA Prox  104     0  intimal thickening +----------+--------+--------+--------+------------------+------------------+ CCA Distal61      10                                                   +----------+--------+--------+--------+------------------+------------------+ ICA Prox  54      10                                                   +----------+--------+--------+--------+------------------+------------------+ ICA Distal71      14                                                   +----------+--------+--------+--------+------------------+------------------+ ECA       77      0                                                    +----------+--------+--------+--------+------------------+------------------+ +----------+--------+--------+----------------+-------------------+           PSV cm/sEDV cm/sDescribe        Arm Pressure (mmHG) +----------+--------+--------+----------------+-------------------+ [id number]             Multiphasic, WNL                     +----------+--------+--------+----------------+-------------------+ +---------+--------+--+--------+--+---------+ VertebralPSV cm/s41EDV cm/s11Antegrade +---------+--------+--+--------+--+---------+   Summary: Right Carotid: The extracranial vessels were near-normal with only minimal wall                thickening or plaque. Left Carotid: The extracranial vessels were near-normal with only minimal wall               thickening or plaque. Vertebrals:  Bilateral vertebral arteries demonstrate antegrade flow. Subclavians: Normal flow hemodynamics were seen in bilateral subclavian              arteries. *See table(s) above for measurements and observations.  Electronically signed by Eather Popp MD on 08/15/2024 at 11:45:14 AM.    Final    CT HEAD WO CONTRAST ( ) Result Date: 08/13/2024 CLINICAL DATA:  Follow-up examination for hemorrhagic stroke. EXAM: CT HEAD WITHOUT CONTRAST TECHNIQUE: Contiguous axial images were obtained from the base of the skull through the vertex without intravenous contrast. RADIATION DOSE REDUCTION: This exam was performed according to the departmental dose-optimization program which includes automated exposure control, adjustment of the mA and/or kV according to patient size and/or use of iterative reconstruction technique. COMPARISON:  Comparison made with prior CT from 08/11/2024 and MRI from 08/12/2024. FINDINGS: Brain: Previously identified intraparenchymal hemorrhage centered at the left basal ganglia again seen, not significantly changed in size or morphology measuring 3.5 x 3.8 x 2.9 cm. Surrounding vasogenic edema is relatively similar. Mass effect on the adjacent left lateral ventricle which is partially effaced. Associated 4 mm of left-to-right shift, not significantly changed. No hydrocephalus or trapping. Basilar cisterns remain patent. Small volume intraventricular hemorrhage with blood seen layering within the occipital horns bilaterally, stable from prior MRI. No  other  new acute intracranial hemorrhage. No other acute large vessel territory infarct. No mass lesion or extra-axial fluid collection. Vascular: No abnormal hyperdense vessel. Calcified atherosclerosis present at the skull base. Skull: Scalp soft tissues and calvarium demonstrate no new finding. Sinuses/Orbits: Globes orbital soft tissues demonstrate no acute finding. Ocular senescent calcifications noted. Scattered mucoperiosteal thickening present about the sphenoid ethmoidal and maxillary sinuses. Mastoid air cells remain largely clear. Nasogastric tube in place. Other: None. IMPRESSION: 1. No significant interval change in size and morphology of intraparenchymal hemorrhage centered at the left basal ganglia. Surrounding vasogenic edema with regional mass effect and 4 mm of left-to-right shift, not appreciably changed. 2. Small volume intraventricular hemorrhage, stable from prior MRI. No hydrocephalus or trapping. 3. No other new acute intracranial abnormality. Electronically Signed   By: Morene Hoard M.D.   On: 08/13/2024 03:19   MR ANGIO HEAD WO CONTRAST Result Date: 08/12/2024 EXAM: MR Angiography Head without intravenous Contrast. 08/12/2024 05:59:00 AM TECHNIQUE: Magnetic resonance angiography images of the head without intravenous contrast. Multiplanar 2D and 3D reformatted images are provided for review. COMPARISON: CT of the head dated 08/11/2024. CLINICAL HISTORY: Stroke, hemorrhagic. FINDINGS: ANTERIOR CIRCULATION: No significant stenosis of the internal carotid arteries. No significant stenosis of the anterior cerebral arteries. No significant stenosis of the middle cerebral arteries. No aneurysm. POSTERIOR CIRCULATION: No significant stenosis of the posterior cerebral arteries. No significant stenosis of the basilar artery. No significant stenosis of the vertebral arteries. No aneurysm. IMPRESSION: 1. No significant stenosis of the intracranial vasculature. Electronically signed by:  Evalene Coho MD 08/12/2024 06:21 AM EST RP Workstation: [id number]   MR BRAIN WO CONTRAST Result Date: 08/12/2024 EXAM: MRI BRAIN WITHOUT CONTRAST 08/12/2024 05:59:00 AM TECHNIQUE: Multiplanar multisequence MRI of the head/brain was performed without the administration of intravenous contrast. COMPARISON: CT of the head dated 08/11/2024. CLINICAL HISTORY: Stroke, hemorrhagic. FINDINGS: BRAIN AND VENTRICLES: Intraparenchymal hemorrhage is again seen centered in the left basal ganglia, measuring approximately 4.4 x 3.5 x 3.8 cm. There is moderate surrounding vasogenic edema. There is also fluid-fluid level present within the collection from hematocrit effect. The left lateral ventricle is partially effaced and there is mild rightward shift to the midline structures by approximately 4 mm, similar to the prior exam. There is no convincing evidence of underlying mass. There is mild-to-moderate nonspecific cerebral white matter disease. No acute infarct. No hydrocephalus. The sella is unremarkable. Normal flow voids. ORBITS: The patient is status post bilateral lens replacement. No acute abnormality. SINUSES AND MASTOIDS: There is mild mucosal disease within the right maxillary sinus. No acute abnormality. BONES AND SOFT TISSUES: Normal marrow signal. No acute soft tissue abnormality. IMPRESSION: 1. Left basal ganglia intraparenchymal hemorrhage measuring approximately 4.4 x 3.5 x 3.8 cm with moderate surrounding vasogenic edema and fluid-fluid level from hematocrit effect, similar to the prior exam. 2. Mild rightward shift of midline structures by approximately 4 mm, similar to the prior exam. 3. Mild-to-moderate nonspecific cerebral white matter disease. Electronically signed by: Evalene Coho MD 08/12/2024 06:19 AM EST RP Workstation: [id number]   ECHOCARDIOGRAM COMPLETE Result Date: 08/11/2024    ECHOCARDIOGRAM REPORT   Patient Name:   Alec [name] [facility] Date of Exam: 08/11/2024 Medical Rec #:  [id number]       Height:       66.0 in Accession #:    [id number]     Weight:       209.4 lb Date of Birth:  04-22-1946     BSA:  2.039 m Patient Age:    78 years       BP:           155/64 mmHg Patient Gender: M              HR:           68 bpm. Exam Location:  Inpatient Procedure: 2D Echo, Cardiac Doppler and Color Doppler (Both Spectral and Color            Flow Doppler were utilized during procedure). Indications:    Stroke  History:        Patient has prior history of Echocardiogram examinations, most                 recent 03/14/2023. CAD, Prior CABG, Stroke and COPD,                 Arrythmias:Bradycardia, Signs/Symptoms:Shortness of Breath; Risk                 Factors:Diabetes, Dyslipidemia, Hypertension and Former Smoker.  Sonographer:    Juliene Rucks Referring Phys: [id number] MCNEILL P KIRKPATRICK  Sonographer Comments: Patient is obese. Image acquisition challenging due to uncooperative patient and Image acquisition challenging due to COPD. IMPRESSIONS  1. Left ventricular ejection fraction, by estimation, is 55 to 60%. The left ventricle has normal function. Left ventricular endocardial border not optimally defined to evaluate regional wall motion. The left ventricular internal cavity size was mildly dilated. There is moderate eccentric left ventricular hypertrophy. Left ventricular diastolic parameters are consistent with Grade I diastolic dysfunction (impaired relaxation).  2. Right ventricular systolic function is normal. The right ventricular size is normal.  3. The mitral valve is normal in structure. Trivial mitral valve regurgitation. No evidence of mitral stenosis.  4. The aortic valve is normal in structure. Aortic valve regurgitation is not visualized. No aortic stenosis is present.  5. The inferior vena cava is normal in size with greater than 50% respiratory variability, suggesting right atrial pressure of 3 mmHg. FINDINGS  Left Ventricle: Left ventricular ejection fraction, by estimation, is 55 to  60%. The left ventricle has normal function. Left ventricular endocardial border not optimally defined to evaluate regional wall motion. The left ventricular internal cavity size was mildly dilated. There is moderate eccentric left ventricular hypertrophy. Abnormal (paradoxical) septal motion consistent with post-operative status. Left ventricular diastolic parameters are consistent with Grade I diastolic dysfunction (impaired relaxation). Right Ventricle: The right ventricular size is normal. No increase in right ventricular wall thickness. Right ventricular systolic function is normal. Left Atrium: Left atrial size was normal in size. Right Atrium: Right atrial size was normal in size. Pericardium: There is no evidence of pericardial effusion. Mitral Valve: The mitral valve is normal in structure. Trivial mitral valve regurgitation. No evidence of mitral valve stenosis. Tricuspid Valve: The tricuspid valve is normal in structure. Tricuspid valve regurgitation is not demonstrated. No evidence of tricuspid stenosis. Aortic Valve: The aortic valve is normal in structure. Aortic valve regurgitation is not visualized. No aortic stenosis is present. Pulmonic Valve: The pulmonic valve was normal in structure. Pulmonic valve regurgitation is mild. No evidence of pulmonic stenosis. Aorta: The aortic root is normal in size and structure. Venous: The inferior vena cava is normal in size with greater than 50% respiratory variability, suggesting right atrial pressure of 3 mmHg. IAS/Shunts: No atrial level shunt detected by color flow Doppler.  LEFT VENTRICLE PLAX 2D LVIDd:         5.80 cm  LVIDs:         3.80 cm LV PW:         1.20 cm LV IVS:        1.20 cm LVOT diam:     2.40 cm LV SV:         114 LV SV Index:   56 LVOT Area:     4.52 cm  RIGHT VENTRICLE RV Basal diam:  3.60 cm RV Mid diam:    3.10 cm TAPSE (M-mode): 1.5 cm LEFT ATRIUM             Index        RIGHT ATRIUM           Index LA diam:        4.60 cm 2.26 cm/m    RA Area:     16.60 cm LA Vol (A2C):   82.5 ml 40.45 ml/m  RA Volume:   48.10 ml  23.59 ml/m LA Vol (A4C):   47.1 ml 23.10 ml/m LA Biplane Vol: 67.6 ml 33.15 ml/m  AORTIC VALVE LVOT Vmax:   117.00 cm/s LVOT Vmean:  79.600 cm/s LVOT VTI:    0.253 m  AORTA Ao Root diam: 3.40 cm Ao Asc diam:  2.80 cm MITRAL VALVE MV Area (PHT): 3.30 cm    SHUNTS MV Decel Time: 230 msec    Systemic VTI:  0.25 m MV E velocity: 97.60 cm/s  Systemic Diam: 2.40 cm MV A velocity: 56.00 cm/s MV E/A ratio:  1.74 Morene Brownie Electronically signed by Morene Brownie Signature Date/Time: 08/11/2024/2:10:49 PM    Final    DG Swallowing Func-Speech Pathology Result Date: 08/11/2024 Table formatting from the original result was not included. Modified Barium Swallow Study Patient Details Name: Alec [name] MRN: [id number] Date of Birth: [birth year] Today's Date: 08/11/2024 HPI/PMH: HPI: KAMEREN PARGAS [name] is a 78 y.o. male who presented 11/11 with right-sided weakness and aphasia. Head CT with large L hemorrage.   CT 11/12: Interval increase in size of left basal ganglia hemorrhage, now  measuring 3.2 x 3.6 x 2.9 cm (estimated volume 17 mL, previously 11  mL). Mild increase in surrounding edema without significant midline  shift.  Pt with hx of diabetes, hypertension, CAD, CHF, RA, COPD Clinical Impression: Clinical Impression: Patient had eyes open but was very lethargic and unable to follow any commands. Unable to achieve full view of pharynx, trachea, PES secondary to patient's positioning with shoulder obscuring view. Attempts to reposition were not successful. SLP administered one spoon sip of thin liquid barium while using hand to support patient's head in neutral position. Bolus remained in anterior portion of oral cavity with no movement observed of oral musculature. SLP used toothette swabs to remove barium from oral cavity. SLP recommending continue NPO and will follow for readiness for repeat MBS as today's MBS was not  successful in adequately assessing his oropharyngeal swallow function. Factors that may increase risk of adverse event in presence of aspiration Noe & Lianne 2021): Factors that may increase risk of adverse event in presence of aspiration Noe & Lianne 2021): Reduced cognitive function; Dependence for feeding and/or oral hygiene Recommendations/Plan: Swallowing Evaluation Recommendations Swallowing Evaluation Recommendations Recommendations: NPO Medication Administration: Via alternative means Oral care recommendations: Oral care QID (4x/day); Staff/trained caregiver to provide oral care Treatment Plan Treatment Plan Treatment recommendations: Therapy as outlined in treatment plan below Follow-up recommendations: Other (comment) (SLP at next venue of care) Functional status assessment: Patient has had a recent decline  in their functional status and demonstrates the ability to make significant improvements in function in a reasonable and predictable amount of time. Treatment frequency: Min 3x/week Treatment duration: 2 weeks Interventions: Aspiration precaution training; Trials of upgraded texture/liquids; Patient/family education Recommendations Recommendations for follow up therapy are one component of a multi-disciplinary discharge planning process, led by the attending physician.  Recommendations may be updated based on patient status, additional functional criteria and insurance authorization. Assessment: Orofacial Exam: Orofacial Exam Oral Cavity - Dentition: Adequate natural dentition Oral Motor/Sensory Function: Suspected cranial nerve impairment CN V - Trigeminal: Right motor impairment CN VII - Facial: Right motor impairment CN IX - Glossopharyngeal, CN X - Vagus: Not tested CN XII - Hypoglossal: Not tested Anatomy: Anatomy: Other (Comment) (difficult to fully visualize secondary to obstruction from shoulder) Boluses Administered: Boluses Administered Boluses Administered: Thin liquids (Level 0)   Oral Impairment Domain: Oral Impairment Domain Tongue control during bolus hold: Not tested Bolus transport/lingual motion: Minimal-no tongue motion Oral residue: Minimal to no clearance Location of oral residue : Floor of mouth Initiation of pharyngeal swallow : No visible initiation at any location  Pharyngeal Impairment Domain: No data recorded Esophageal Impairment Domain: No data recorded Pill: No data recorded Penetration/Aspiration Scale Score: No data recorded Compensatory Strategies: No data recorded  General Information: No data recorded Diet Prior to this Study: NPO   No data recorded  Respiratory Status: WFL   Supplemental O2: None (Room air)   History of Recent Intubation: No  Behavior/Cognition: Lethargic/Drowsy; Doesn't follow directions; Requires cueing Self-Feeding Abilities: Dependent for feeding Baseline vocal quality/speech: Not observed Volitional Cough: Unable to elicit Volitional Swallow: Unable to elicit Exam Limitations: Poor positioning; Limited visibility; Fatigue; Poor bolus acceptance Goal Planning: Prognosis for improved oropharyngeal function: Fair Barriers to Reach Goals: Severity of deficits No data recorded Patient/Family Stated Goal: resume POs Consulted and agree with results and recommendations: Pt unable/family or caregiver not available Pain: Pain Assessment Pain Assessment: Faces Faces Pain Scale: 0 End of Session: Start Time:SLP Start Time (ACUTE ONLY): 1225 Stop Time: SLP Stop Time (ACUTE ONLY): 1235 Time Calculation:SLP Time Calculation (min) (ACUTE ONLY): 10 min Charges: SLP Evaluations $ SLP Speech Visit: 1 Visit SLP Evaluations $BSS Swallow: 1 Procedure $MBS Swallow: 1 Procedure SLP visit diagnosis: SLP Visit Diagnosis: Dysphagia, oral phase (R13.11) Past Medical History: Past Medical History: Diagnosis Date  Anemia   Anxiety   Aortic insufficiency   Arthritis   Bilateral lower extremity edema   Chronic gout   06-19-2020  per pt last episode 6 months , great toe  CKD  (chronic kidney disease), stage IV (HCC)   Coronary artery disease cardiologist--- dr hochrein  CABG 2019  Degenerative arthritis of shoulder region 08/2013  left  History of bladder cancer 07/2008  s/p  TURBT  History of cellulitis 11/2017  left upper arm  History of kidney stones   Hyperlipidemia   Hypertension   followed by pcp/ cardiology  IDDM (insulin  dependent diabetes mellitus)   endocrinologist--- dr von---  pt uses insulin  pump and dexcom  (06-19-2020 per pt fasting sugar-- 98--110)  Insulin  pump in place   Mitral regurgitation   OSA (obstructive sleep apnea)   Prostate cancer [facility]) urologist--- dr herrick/  oncologist--- [place]  dx 11/ 2017  Gleason 3+3 active survillance;  bx 03-14-2020 Gleason 3+4  RA (rheumatoid arthritis) (HCC)   rhemotologist--- dr donnis---    S/P CABG x 3 07/15/2018  LIMA to LAD;  SVG to PDA;  SVG to RI  Thrombocytopenia  Past Surgical History: Past Surgical History: Procedure Laterality Date  CHOLECYSTECTOMY  08/29/2012  Procedure: LAPAROSCOPIC CHOLECYSTECTOMY WITH INTRAOPERATIVE CHOLANGIOGRAM;  Surgeon: Donnice POUR. Tsuei, MD;  Location: [facility] ORS;  Service: General;  Laterality: N/A;  CORONARY ARTERY BYPASS GRAFT N/A 07/20/2018  Procedure: CORONARY ARTERY BYPASS GRAFTING (CABG) times three using left internal mammary artery to the LAD, and using endoscopically harvested right saphenous vein to PDA and intermedius.;  Surgeon: Army Dallas NOVAK, MD;  Location: [facility] OR;  Service: Open Heart Surgery;  Laterality: N/A;  LEFT HEART CATH AND CORONARY ANGIOGRAPHY N/A 07/15/2018  Procedure: LEFT HEART CATH AND CORONARY ANGIOGRAPHY;  Surgeon: Anner Alm ORN, MD;  Location[facility]VAS[facility] CV LAB;  Service: Cardiovascular;  Laterality: N/A;  RADIOACTIVE SEED IMPLANT N/A 06/21/2020  Procedure: RADIOACTIVE SEED IMPLANT/BRACHYTHERAPY IMPLANT;  Surgeon: Cam Morene ORN, MD;  [facility];  Service: Urology;  Laterality: N/A;  SHOULDER ARTHROSCOPY W/ ROTATOR CUFF  REPAIR Right 09/03/2013  SHOULDER ARTHROSCOPY WITH SUBACROMIAL DECOMPRESSION, ROTATOR CUFF REPAIR AND BICEP TENDON REPAIR Left 09/03/2013  Procedure: LEFT SHOULDER ARTHROSCOPY WITH EXTENSIVE DEBRIDMENT, DISTAL CLAVICULECTOMY, ROTATOR CUFF REPAIR AND SUBACROMIAL DECOMPRESSION PARTIAL ACRIOMIOPLASTY WITH CORACOACROMIAL RELEASE;  Surgeon: Evalene JONETTA Chancy, MD;  Location: [facility];  Service: Orthopedics;  Laterality: Left;  SPACE OAR INSTILLATION N/A 06/21/2020  Procedure: SPACE OAR INSTILLATION;  Surgeon: Cam Morene ORN, MD;  Location[facility];  Service: Urology;  Laterality: N/A;  TEE WITHOUT CARDIOVERSION N/A 07/20/2018  Procedure: TRANSESOPHAGEAL ECHOCARDIOGRAM (TEE);  Surgeon: Army Dallas NOVAK, MD;  [facility]: [facility] OR;  Service: Open Heart Surgery;  Laterality: N/A;  TRANSURETHRAL RESECTION OF BLADDER TUMOR WITH MITOMYCIN -C  08/24/2008   @[facility]  Norleen IVAR Blase, MA, CCC-SLP Speech Therapy   CT HEAD POST STROKE FOLLOWUP/TIMED/STAT READ Result Date: 08/11/2024 EXAM: CT HEAD WITHOUT CONTRAST 08/11/2024 12:20:00 PM TECHNIQUE: CT of the head was performed without the administration of intravenous contrast. Automated exposure control, iterative reconstruction, and/or weight based adjustment of the mA/kV was utilized to reduce the radiation dose to as low as reasonably achievable. COMPARISON: 08/11/2024 CLINICAL HISTORY: Neuro deficit, acute, stroke suspected. FINDINGS: BRAIN AND VENTRICLES: An amorphous area of intraparenchymal hemorrhage within the left basal ganglia has not clearly changed in size or appearance in the interim, again measuring approximately 3.6 x 3.2 x 2.9 cm. Mild surrounding edema. Rightward midline shift measuring 4 mm. Effacement of left lateral ventricle. Vascular calcifications. No evidence of acute infarct. No extra-axial collection. ORBITS: Bilateral lens replacement. SINUSES: Clear paranasal sinuses. SOFT TISSUES AND SKULL: No acute soft tissue  abnormality. No skull fracture. IMPRESSION: 1. Amorphous area of intraparenchymal hemorrhage within the left basal ganglia, measuring approximately 3.6 x 3.2 x 2.9 cm, with mild surrounding edema and rightward midline shift measuring 4 mm. No clear change in size or appearance compared to the prior study. 2. Effacement of the left lateral ventricle. Electronically signed by: Evalene Coho MD 08/11/2024 12:32 PM EST RP Workstation: [id number]   CT HEAD POST STROKE FOLLOWUP/TIMED/STAT READ Result Date: 08/11/2024 CLINICAL DATA:  Follow-up examination for stroke. EXAM: CT HEAD WITHOUT CONTRAST TECHNIQUE: Contiguous axial images were obtained from the base of the skull through the vertex without intravenous contrast. RADIATION DOSE REDUCTION: This exam was performed according to the departmental dose-optimization program which includes automated exposure control, adjustment of the mA and/or kV according to patient size and/or use of iterative reconstruction technique. COMPARISON:  CT from 08/10/2024. FINDINGS: Brain: Previously identified hemorrhage centered at the left basal ganglia again seen.  Hemorrhages increased in size now measuring 3.2 x 3.6 x 2.9 cm (estimated volume 17 mL, previously 11 mL). Surrounding edema, mildly increased. Partial effacement of the left lateral ventricle without significant midline shift. No intraventricular extension. No other acute intracranial hemorrhage. No other acute large vessel territory infarct. No mass lesion. No hydrocephalus or extra-axial fluid collection. Vascular: No abnormal hyperdense vessel. Calcified atherosclerosis present at the skull base. Skull: Scalp soft tissues and calvarium demonstrate no new finding. Sinuses/Orbits: Globes and orbital soft tissues within normal limits. Paranasal sinuses and mastoid air cells are largely clear. Other: None. IMPRESSION: 1. Interval increase in size of left basal ganglia hemorrhage, now measuring 3.2 x 3.6 x 2.9 cm  (estimated volume 17 mL, previously 11 mL). Mild increase in surrounding edema without significant midline shift. 2. No other new acute intracranial abnormality. Electronically Signed   By: Morene Hoard M.D.   On: 08/11/2024 03:32   CT HEAD CODE STROKE WO CONTRAST Result Date: 08/10/2024 CLINICAL DATA:  Code stroke. Initial evaluation for acute neuro deficit, stroke suspected. EXAM: CT HEAD WITHOUT CONTRAST TECHNIQUE: Contiguous axial images were obtained from the base of the skull through the vertex without intravenous contrast. RADIATION DOSE REDUCTION: This exam was performed according to the departmental dose-optimization program which includes automated exposure control, adjustment of the mA and/or kV according to patient size and/or use of iterative reconstruction technique. COMPARISON:  None Available. FINDINGS: Brain: Acute intraparenchymal hemorrhage centered at the left basal ganglia measures 3.0 x 2.8 x 2.6 cm (estimated volume 11 mL). Mild surrounding edema with partial effacement of the left lateral ventricle, but no significant midline shift. No intraventricular extension or other complication. No other acute intracranial hemorrhage. No other acute large vessel territory infarct. No mass lesion. No extra-axial fluid collection. No hydrocephalus. Vascular: No abnormal hyperdense vessel. Calcified atherosclerosis present at skull base. Skull: Scalp soft tissues and calvarium demonstrate no acute finding. Sinuses/Orbits: Globes orbital soft tissues grossly within normal limits. Mild chronic mucoperiosteal thickening present about the ethmoidal air cells and maxillary sinuses. No significant mastoid effusion. Other: None. ASPECTS [facility] Stroke Program Early CT Score) Acute ICH, does not apply. IMPRESSION: 3.0 x 2.8 x 2.6 cm (estimated volume 11 mL) acute intraparenchymal hemorrhage centered at the left basal ganglia. Mild surrounding edema with partial effacement of the left lateral  ventricle, but no significant midline shift. No intraventricular extension or other complication. These results were communicated to Dr. Michaela at 7:08 pm on 08/10/2024 by text page via the [facility] messaging system. Electronically Signed   By: Morene Hoard M.D.   On: 08/10/2024 19:09    Microbiology: Results for orders placed or performed during the hospital encounter of 08/10/24  MRSA Next Gen by PCR, Nasal     Status: None   Collection Time: 08/10/24 11:04 PM   Specimen: Nasal Mucosa; Nasal Swab  Result Value Ref Range Status   MRSA by PCR Next Gen NOT DETECTED NOT DETECTED Final    Comment: (NOTE) The GeneXpert MRSA Assay (FDA approved for NASAL specimens only), is one component of a comprehensive MRSA colonization surveillance program. It is not intended to diagnose MRSA infection nor to guide or monitor treatment for MRSA infections. Test performance is not FDA approved in patients less than 66 years old. Performed at [facility], [address], [place], [place] [address]   Culture, Respiratory w Gram Stain     Status: None   Collection Time: 08/17/24 11:19 AM   Specimen: Tracheal Aspirate; Respiratory  Result Value Ref Range Status   Specimen Description TRACHEAL ASPIRATE  Final   Special Requests NONE  Final   Gram Stain   Final    MODERATE SQUAMOUS EPITHELIAL CELLS PRESENT FEW WBC PRESENT, PREDOMINANTLY PMN MODERATE GRAM POSITIVE COCCI MODERATE GRAM POSITIVE RODS MODERATE GRAM NEGATIVE RODS    Culture   Final    FEW Normal respiratory flora-no Staph aureus or Pseudomonas seen Performed at [facility], [address] [place], [place] [address]    Report Status 08/19/2024 FINAL  Final    Labs: CBC: Recent Labs  Lab 08/29/24 0413  WBC 4.4  HGB 10.9*  HCT 33.1*  MCV 95.7  PLT 156   Basic Metabolic Panel: Recent Labs  Lab 08/31/24 0524 08/31/24 1349 08/31/24 2238 09/02/24 0720 09/03/24 0758  NA 145 142 140 143 141  K 3.7 3.7 3.6  3.6 4.0  CL 112* 109 106 110 103  CO2 24 22 25 22 24   GLUCOSE 104* 247* 196* 123* 133*  BUN 60* 63* 58* 52* 50*  CREATININE 4.40* 4.30* 4.16* 3.98* 3.90*  CALCIUM  8.7* 8.8* 8.7* 8.9 8.9  MG  --   --   --  2.0  --    Liver Function Tests: No results for input(s): AST, ALT, ALKPHOS, BILITOT, PROT, ALBUMIN  in the last 168 hours. CBG: Recent Labs  Lab 09/02/24 2123 09/03/24 0619 09/03/24 1117 09/03/24 1522 09/04/24 0613  GLUCAP 198* 105* 226* 220* 168*    Discharge time spent: greater than 30 minutes.  Signed: Sabas GORMAN Brod, MD [facility] 09/04/2024

## 2024-09-03 NOTE — TOC Progression Note (Signed)
 Transition of Care [facility]) - Progression Note    Patient Details  Name: Alec [name] MRN: [id number] Date of Birth: [birth year]  [facility] CM/SW Contact  [name], [place] Phone Number: 09/03/2024, 3:37 PM  Clinical Narrative:   CSW updated by MD that patient with new medical issues, discharge canceled. CSW confirmed with [facility] that patient can admit over the weekend if stabilizes. CSW to follow.    Expected Discharge Plan: Skilled Nursing Facility Barriers to Discharge: Continued Medical Work up               Expected Discharge Plan and Services         Expected Discharge Date: 09/03/24                                     Social Drivers of Health (SDOH) Interventions SDOH Screenings   Food Insecurity: Patient Unable To Answer (08/13/2024)  Housing: Patient Unable To Answer (08/13/2024)  Transportation Needs: Patient Unable To Answer (08/13/2024)  Utilities: Patient Unable To Answer (08/13/2024)  Depression (PHQ2-9): Low Risk  (02/27/2023)  Social Connections: Patient Unable To Answer (08/13/2024)  Tobacco Use: Medium Risk (08/10/2024)    Readmission Risk Interventions    03/17/2023    3:00 PM 03/29/2022    2:24 PM  Readmission Risk Prevention Plan  Transportation Screening Complete Complete  PCP or Specialist Appt within 5-7 Days  Complete  PCP or Specialist Appt within 3-5 Days Complete   Home Care Screening  Complete  Medication Review (RN CM)  Complete  HRI or Home Care Consult Complete   Palliative Care Screening Not Applicable   Medication Review (RN Care Manager) Complete

## 2024-09-03 NOTE — Progress Notes (Signed)
 Physical Therapy Treatment Patient Details Name: Alec [name] MRN: [id number] DOB: Mar 03, 1946 Today's Date: 09/03/2024   History of Present Illness 78 y.o. male adm 08/10/2024 with Rt weakness and aphasia. Pt with large Lt basal ganglia hemorrhage complicated by AKI and aspiration. Intubated 11/18-11/21. PMHx: DM, HTN, CAD s/p CABG, CHF, RA, COPD, CKD, bladder & prostate CA, anxiety, gout, HLD    PT Comments  Pt tolerated treatment well today. Co-treat with OT. Pt today was able to perform multiple sit to stands in stedy with +2 Min A however once fatigued pt required closer to +2 Max A. Mirror provided for visual feedback. No change in DC/DME recs at this time. Pt and spouse anticipate DC to SNF today.    If plan is discharge home, recommend the following: Two people to help with walking and/or transfers;Two people to help with bathing/dressing/bathroom;Assistance with feeding;Direct supervision/assist for medications management;Direct supervision/assist for financial management;Assist for transportation;Help with stairs or ramp for entrance;Supervision due to cognitive status   Can travel by private vehicle     No  Equipment Recommendations  Wheelchair (measurements PT);Wheelchair cushion (measurements PT);Hospital bed;Hoyer lift;BSC/3in1    Recommendations for Other Services       Precautions / Restrictions Precautions Precautions: Fall;Other (comment) Recall of Precautions/Restrictions: Impaired Precaution/Restrictions Comments: SBP <160; pushes to the R Restrictions Weight Bearing Restrictions Per Provider Order: No     Mobility  Bed Mobility Overal bed mobility: Needs Assistance Bed Mobility: Supine to Sit, Sit to Supine     Supine to sit: Mod assist, +2 for physical assistance Sit to supine: Max assist, +2 for safety/equipment   General bed mobility comments: patient requiring assistance to initiate and with RUE and RLE    Transfers Overall transfer level: Needs  assistance Equipment used: Ambulation equipment used Transfers: Sit to/from Stand Sit to Stand: Mod assist, +2 physical assistance           General transfer comment: mod assist +2 to stand from EOB and patient able to stand from [facility] with min to CGA of 2 for safety.  Increased assistance required as patient became more fatigued    Ambulation/Gait               General Gait Details: unable at this time   Stairs             Wheelchair Mobility     Tilt Bed    Modified Rankin (Stroke Patients Only)       Balance Overall balance assessment: Needs assistance Sitting-balance support: Single extremity supported, Feet supported Sitting balance-Leahy Scale: Poor Sitting balance - Comments: mod assist due to pushes to LUE.  Lateral leaning performed to address Postural control: Posterior lean, Right lateral lean Standing balance support: Bilateral upper extremity supported, During functional activity Standing balance-Leahy Scale: Poor Standing balance comment: reliant on Stedy and therapist to stand                            Communication Communication Communication: Impaired Factors Affecting Communication: Difficulty expressing self;Reduced clarity of speech  Cognition Arousal: Alert Behavior During Therapy: Flat affect, Impulsive                             Following commands: Impaired Following commands impaired: Follows one step commands inconsistently, Follows one step commands with increased time    Cueing Cueing Techniques: Verbal cues, Tactile cues, Gestural cues, Visual cues  Exercises Other Exercises Other Exercises: L elbow props and push back to midline x5 Other Exercises: Weight shifts in standing with stedy    General Comments General comments (skin integrity, edema, etc.): VSS      Pertinent Vitals/Pain Pain Assessment Pain Assessment: No/denies pain    Home Living                          Prior  Function            PT Goals (current goals can now be found in the care plan section) Progress towards PT goals: Progressing toward goals    Frequency    Min 2X/week      PT Plan      Co-evaluation PT/OT/SLP Co-Evaluation/Treatment: Yes Reason for Co-Treatment: For patient/therapist safety;To address functional/ADL transfers PT goals addressed during session: Strengthening/ROM;Balance;Mobility/safety with mobility OT goals addressed during session: ADL's and self-care      AM-PAC PT 6 Clicks Mobility   Outcome Measure  Help needed turning from your back to your side while in a flat bed without using bedrails?: A Lot Help needed moving from lying on your back to sitting on the side of a flat bed without using bedrails?: A Lot Help needed moving to and from a bed to a chair (including a wheelchair)?: Total Help needed standing up from a chair using your arms (e.g., wheelchair or bedside chair)?: Total Help needed to walk in hospital room?: Total Help needed climbing 3-5 steps with a railing? : Total 6 Click Score: 8    End of Session Equipment Utilized During Treatment: Gait belt Activity Tolerance: Patient tolerated treatment well;Patient limited by fatigue Patient left: in bed;with call bell/phone within reach;with bed alarm set;with family/visitor present Nurse Communication: Mobility status;Need for lift equipment PT Visit Diagnosis: Hemiplegia and hemiparesis;Unsteadiness on feet (R26.81);Other abnormalities of gait and mobility (R26.89);Muscle weakness (generalized) (M62.81);Difficulty in walking, not elsewhere classified (R26.2);Other symptoms and signs involving the nervous system (R29.898) Hemiplegia - Right/Left: Right Hemiplegia - dominant/non-dominant: Dominant Hemiplegia - caused by: Nontraumatic intracerebral hemorrhage     Time: 9046-8978 PT Time Calculation (min) (ACUTE ONLY): 28 min  Charges:    $Therapeutic Activity: 8-22 mins PT General  Charges $$ ACUTE PT VISIT: 1 Visit                     Roney Youtz B, PT, DPT Acute Rehab Services [phone]    Jakaya Jacobowitz 09/03/2024, 3:10 PM

## 2024-09-03 NOTE — Progress Notes (Signed)
 NT called RN to the room and reported that PTAR was at the bedside to transport the patient to [facility]; however, the patient was complaining of severe right arm pain, and the patient's wife wanted to know if the MD was aware and if the patient was still cleared for transfer. RN notified the MD via message, reporting the patient's intense right arm pain, which the patient stated had been present since earlier in the day. RN also informed the MD that PTAR obtained vital signs showing a temperature of 100.70F. RN assessed the patient and noted that he did not appear well. MD arrived at the bedside to assess the patient. New orders were placed, and the discharge was canceled for today.

## 2024-09-03 NOTE — Progress Notes (Signed)
 Patient going to [facility], room 410B. Report given to [facility].

## 2024-09-03 NOTE — TOC Transition Note (Signed)
 Transition of Care [facility]) - Discharge Note   Patient Details  Name: Alec [name] MRN: [id number] Date of Birth: 06/13/1946  [facility]) CM/SW Contact:  [name], [place] Phone Number: 09/03/2024, 12:29 PM   Clinical Narrative:   CSW updated by MD that patient is stable for discharge, and CSW confirmed with [facility] that they have a bed available for patient today. CSW met with patient and spouse at bedside, they are in agreement. CSW sent discharge information, confirmed receipt and transport can be arranged. Transport arranged with PTAR for next available.  CSW spoke with SLP working with patient today, she made extra notes for the speech therapy team at [facility]. CSW contacted [facility] and sent SLP note from today to make them aware of needs.   Nurse to call report to [phone], Room 410B.    Final next level of care: Skilled Nursing Facility Barriers to Discharge: Barriers Resolved   Patient Goals and CMS Choice            Discharge Placement              Patient chooses bed at: [facility] Patient to be transferred to facility by: PTAR Name of family member notified: Lonell Patient and family notified of of transfer: 09/03/24  Discharge Plan and Services Additional resources added to the After Visit Summary for                                       Social Drivers of Health (SDOH) Interventions SDOH Screenings   Food Insecurity: Patient Unable To Answer (08/13/2024)  Housing: Patient Unable To Answer (08/13/2024)  Transportation Needs: Patient Unable To Answer (08/13/2024)  Utilities: Patient Unable To Answer (08/13/2024)  Depression (PHQ2-9): Low Risk  (02/27/2023)  Social Connections: Patient Unable To Answer (08/13/2024)  Tobacco Use: Medium Risk (08/10/2024)     Readmission Risk Interventions    03/17/2023    3:00 PM 03/29/2022    2:24 PM  Readmission Risk Prevention Plan  Transportation Screening Complete  Complete  PCP or Specialist Appt within 5-7 Days  Complete  PCP or Specialist Appt within 3-5 Days Complete   Home Care Screening  Complete  Medication Review (RN CM)  Complete  HRI or Home Care Consult Complete   Palliative Care Screening Not Applicable   Medication Review (RN Care Manager) Complete

## 2024-09-03 NOTE — Progress Notes (Incomplete)
 [facility]  PROGRESS NOTE  Alec [name] FMW:[id number] DOB: [birth year] DOA: 08/10/2024 PCP: Loreli Kins, MD   Brief HPI:   78yo with hx DM, HTN, CAD, CHF, RA, COPD, and chronic pancytopenia who initially presented on 11/11 with R sided weakness and aphasia, found to have L sided subcortical ICH suspected due to uncontrolled hypertension. Hospital course complicated with acute encephalopathy, dysphagia on modified diet as per SLP recommendations, aspiration pneumonia treated with antibiotic course, acute respiratory failure requiring mechanical ventilation then extubated on 11/21, AKI on CKD 4 now at new baseline, hypernatremia that has been recurrent in nature due to poor oral hydration resolved afternoon 12/2 with IV hydration, and hyperkalemia resolved with improvement in renal function. Need to encourage oral intake and monitor Na for stability.     Assessment/Plan:   Acute encephalopathy Likely in the setting of ICH  -Encephalopathy seems to have resolved -Continue Seroquel  at bedtime  Nonsustained V. Tach - Had 21 beat nonsustained V. tach this morning - Serum potassium was 3.6, serum magnesium  2.0 - Will start Coreg  6.25 mg p.o. twice daily - Will cut down the dose of amlodipine  to 5 mg daily    Hypernatremia -Serum sodium is 143 today -Improved; D5W has been stopped -Started on dysphagia 3 diet with nectar thick liquid     CKD (chronic kidney disease), stage IV (HCC) -Developed AKI on underlying CKD stage IV during hospitalization -Nephrology consulted; not a good candidate for long-term hemodialysis -Creatinine is 3.98, close to baseline   ICH (intracerebral hemorrhage) (HCC) Stable with midline shift, unlikely secondary to uncontrolled hypertension. -Continue holding antiplatelets. - cont statin - PT recommend SNF   Acute respiratory failure with hypoxia (HCC) -Resolved Patient developed acute hypoxic respiratory failure requiring intubation,  extubated on 11/21 and now weaned back to room air. -Continue to monitor -Supplemental oxygen as needed   Hypertension Blood pressure better controlled Discontinue hydralazine  - Started on Coreg  6.25 mg p.o. twice daily as above - Continue amlodipine  at low-dose of 5 mg daily, - Will titrate medications as per blood pressure response to new regimen  Type 2 diabetes mellitus with chronic kidney disease, with long-term current use of insulin  (HCC) CBG currently within goal. - Continue Semglee , sliding cell insulin  NovoLog   Aspiration pneumonia (HCC) Patient was started on Unasyn  in ICU for concern of aspiration pneumonia. Finished Unasyn  7-day course on 11/24   COPD (chronic obstructive pulmonary disease) (HCC) -Continue with bronchodilators   Dysphagia Improving --Underwent modified barium swallow today -Started on dysphagia 3 diet with nectar thick liquid  Dyspepsia - postprantial belching and heartburn - start pantoprazole  trial - start maalox PRN   Metabolic acidosis likely secondary to CKD. - Titrate p.o. bicarb tablets as needed -Continue to monitor   Hyperlipidemia - Continue with Lipitor      DVT prophylaxis: Heparin   Medications     allopurinol   50 mg Oral Daily   amLODipine   5 mg Oral Daily   atorvastatin   40 mg Oral Daily   carvedilol   6.25 mg Oral BID WC   feeding supplement  237 mL Oral BID BM   heparin  injection (subcutaneous)  5,000 Units Subcutaneous Q8H   insulin  aspart  0-15 Units Subcutaneous TID WC   insulin  aspart  0-5 Units Subcutaneous QHS   insulin  glargine-yfgn  28 Units Subcutaneous Daily   isosorbide  mononitrate  120 mg Oral Daily   latanoprost   1 drop Both Eyes QHS   melatonin  3 mg Oral QHS   multivitamin with  minerals  1 tablet Oral Daily   mouth rinse  15 mL Mouth Rinse 4 times per day   pantoprazole   40 mg Oral Daily   QUEtiapine   25 mg Oral QHS   senna-docusate  1 tablet Oral BID   sertraline   50 mg Oral Daily   sodium  bicarbonate  1,300 mg Oral BID     Data Reviewed:   CBG:  Recent Labs  Lab 09/02/24 0645 09/02/24 1224 09/02/24 1610 09/02/24 2123 09/03/24 0619  GLUCAP 113* 262* 234* 198* 105*    SpO2: 100 % O2 Flow Rate (L/min): 4 L/min FiO2 (%): 98 %    Vitals:   09/02/24 1946 09/02/24 2336 09/03/24 0342 09/03/24 0500  BP: (!) 133/57 136/67 (!) 165/65   Pulse: 67 77 74   Resp: 18 18 18    Temp: (!) 97.4 F (36.3 C) 97.6 F (36.4 C) 98.7 F (37.1 C)   TempSrc: Oral  Oral   SpO2: 99% 97% 100%   Weight:    85.6 kg  Height:          Data Reviewed:  Basic Metabolic Panel: Recent Labs  Lab 08/30/24 0809 08/31/24 0524 08/31/24 1349 08/31/24 2238 09/02/24 0720  NA 149* 145 142 140 143  K 3.9 3.7 3.7 3.6 3.6  CL 116* 112* 109 106 110  CO2 23 24 22 25 22   GLUCOSE 120* 104* 247* 196* 123*  BUN 58* 60* 63* 58* 52*  CREATININE 4.38* 4.40* 4.30* 4.16* 3.98*  CALCIUM  9.1 8.7* 8.8* 8.7* 8.9  MG  --   --   --   --  2.0    CBC: Recent Labs  Lab 08/29/24 0413  WBC 4.4  HGB 10.9*  HCT 33.1*  MCV 95.7  PLT 156    LFT No results for input(s): AST, ALT, ALKPHOS, BILITOT, PROT, ALBUMIN  in the last 168 hours.   Antibiotics: Anti-infectives (From admission, onward)    Start     Dose/Rate Route Frequency Ordered Stop   08/20/24 0000  Ampicillin -Sulbactam (UNASYN ) 3 g in sodium chloride  0.9 % 100 mL IVPB        3 g 200 mL/hr over 30 Minutes Intravenous Every 12 hours 08/19/24 1344 08/23/24 2108   08/18/24 2000  piperacillin -tazobactam (ZOSYN ) IVPB 2.25 g  Status:  Discontinued        2.25 g 100 mL/hr over 30 Minutes Intravenous Every 6 hours 08/18/24 1520 08/19/24 1342   08/18/24 1400  piperacillin -tazobactam (ZOSYN ) IVPB 3.375 g  Status:  Discontinued        3.375 g 12.5 mL/hr over 240 Minutes Intravenous Every 8 hours 08/18/24 0736 08/18/24 1520   08/17/24 1600  piperacillin -tazobactam (ZOSYN ) IVPB 2.25 g  Status:  Discontinued        2.25 g 100 mL/hr over  30 Minutes Intravenous Every 6 hours 08/17/24 1500 08/18/24 0736        CONSULTS   Code Status: Full code  Family Communication: Discussed with patient's wife at bedside     Subjective     Objective    Physical Examination:       Wound 08/17/24 1200 Pressure Injury Sacrum Stage 1 -  Intact skin with non-blanchable redness of a localized area usually over a bony prominence. (Active)     Wound 08/17/24 1200 Pressure Injury Heel Left Stage 1 -  Intact skin with non-blanchable redness of a localized area usually over a bony prominence. (Active)        Sabas GORMAN Brod  [facility] If 7PM-7AM, please contact night-coverage at [email or web address], Office  [phone]   09/03/2024, 7:44 AM  LOS: 24 days

## 2024-09-04 LAB — GLUCOSE, CAPILLARY
Glucose-Capillary: 168 mg/dL — ABNORMAL HIGH (ref 70–99)
Glucose-Capillary: 257 mg/dL — ABNORMAL HIGH (ref 70–99)

## 2024-09-04 NOTE — Progress Notes (Signed)
 Called report to nisha, LPN at [facility] [employer].   Amado GORMAN Arabia, RN

## 2024-09-04 NOTE — TOC Transition Note (Signed)
 Transition of Care [facility]) - Discharge Note   Patient Details  Name: Alec [name] MRN: [id number] Date of Birth: 1946-01-29  [facility] CM/SW Contact:  Gwenn Julien Norris, [place] Phone Number: 09/04/2024, 9:19 AM   Clinical Narrative: Pt for dc to [facility] today. Spoke to Brittany at [facility] who confirmed they are prepared to admit pt to room 410B. Pt's wife at bedside and agreeable to dc plan. RN provided with number for report and PTAR arranged for transport. SW signing off at dc.   Julien Gwenn, MSW, LCSW [phone] (coverage)       Final next level of care: Skilled Nursing Facility Barriers to Discharge: Continued Medical Work up   Patient Goals and CMS Choice            Discharge Placement              Patient chooses bed at: [facility] Patient to be transferred to facility by: PTAR Name of family member notified: Lonell Patient and family notified of of transfer: 09/03/24  Discharge Plan and Services Additional resources added to the After Visit Summary for                                       Social Drivers of Health (SDOH) Interventions SDOH Screenings   Food Insecurity: Patient Unable To Answer (08/13/2024)  Housing: Patient Unable To Answer (08/13/2024)  Transportation Needs: Patient Unable To Answer (08/13/2024)  Utilities: Patient Unable To Answer (08/13/2024)  Depression (PHQ2-9): Low Risk  (02/27/2023)  Social Connections: Patient Unable To Answer (08/13/2024)  Tobacco Use: Medium Risk (08/10/2024)     Readmission Risk Interventions    03/17/2023    3:00 PM 03/29/2022    2:24 PM  Readmission Risk Prevention Plan  Transportation Screening Complete Complete  PCP or Specialist Appt within 5-7 Days  Complete  PCP or Specialist Appt within 3-5 Days Complete   Home Care Screening  Complete  Medication Review (RN CM)  Complete  HRI or Home Care Consult Complete   Palliative Care Screening Not Applicable    Medication Review (RN Care Manager) Complete

## 2024-09-07 DIAGNOSIS — E870 Hyperosmolality and hypernatremia: Secondary | ICD-10-CM | POA: Diagnosis not present

## 2024-09-07 DIAGNOSIS — E1122 Type 2 diabetes mellitus with diabetic chronic kidney disease: Secondary | ICD-10-CM | POA: Diagnosis not present

## 2024-09-07 DIAGNOSIS — E785 Hyperlipidemia, unspecified: Secondary | ICD-10-CM | POA: Diagnosis not present

## 2024-09-07 DIAGNOSIS — E8721 Acute metabolic acidosis: Secondary | ICD-10-CM | POA: Diagnosis not present

## 2024-09-17 ENCOUNTER — Ambulatory Visit: Admitting: Podiatry
